# Patient Record
Sex: Female | Born: 1965 | Race: White | Hispanic: No | Marital: Married | Smoking: Current some day smoker
Health system: Southern US, Community
[De-identification: ages and names within clinical notes are randomized; demographics above are authoritative.]

## PROBLEM LIST (undated history)

## (undated) DIAGNOSIS — I10 Essential (primary) hypertension: Secondary | ICD-10-CM

## (undated) DIAGNOSIS — I219 Acute myocardial infarction, unspecified: Secondary | ICD-10-CM

## (undated) DIAGNOSIS — IMO0001 Reserved for inherently not codable concepts without codable children: Secondary | ICD-10-CM

## (undated) DIAGNOSIS — R918 Other nonspecific abnormal finding of lung field: Secondary | ICD-10-CM

## (undated) DIAGNOSIS — J45909 Unspecified asthma, uncomplicated: Secondary | ICD-10-CM

## (undated) DIAGNOSIS — G43909 Migraine, unspecified, not intractable, without status migrainosus: Secondary | ICD-10-CM

## (undated) DIAGNOSIS — R1314 Dysphagia, pharyngoesophageal phase: Secondary | ICD-10-CM

## (undated) DIAGNOSIS — J439 Emphysema, unspecified: Secondary | ICD-10-CM

## (undated) DIAGNOSIS — E7800 Pure hypercholesterolemia, unspecified: Secondary | ICD-10-CM

## (undated) DIAGNOSIS — I509 Heart failure, unspecified: Secondary | ICD-10-CM

## (undated) DIAGNOSIS — J9692 Respiratory failure, unspecified with hypercapnia: Secondary | ICD-10-CM

## (undated) DIAGNOSIS — K219 Gastro-esophageal reflux disease without esophagitis: Secondary | ICD-10-CM

## (undated) DIAGNOSIS — J8489 Other specified interstitial pulmonary diseases: Secondary | ICD-10-CM

## (undated) DIAGNOSIS — F419 Anxiety disorder, unspecified: Secondary | ICD-10-CM

## (undated) DIAGNOSIS — T753XXA Motion sickness, initial encounter: Secondary | ICD-10-CM

## (undated) DIAGNOSIS — S2220XA Unspecified fracture of sternum, initial encounter for closed fracture: Secondary | ICD-10-CM

## (undated) DIAGNOSIS — J302 Other seasonal allergic rhinitis: Secondary | ICD-10-CM

## (undated) HISTORY — PX: LUNG SURGERY: SHX703

## (undated) HISTORY — DX: Emphysema, unspecified: J43.9

## (undated) HISTORY — DX: Pure hypercholesterolemia, unspecified: E78.00

## (undated) HISTORY — PX: VAGINAL HYSTERECTOMY: SUR661

## (undated) HISTORY — DX: Dysphagia, pharyngoesophageal phase: R13.14

## (undated) HISTORY — DX: Anxiety disorder, unspecified: F41.9

## (undated) HISTORY — DX: Other seasonal allergic rhinitis: J30.2

## (undated) HISTORY — PX: OOPHORECTOMY: SHX86

## (undated) HISTORY — DX: Other nonspecific abnormal finding of lung field: R91.8

## (undated) HISTORY — DX: Migraine, unspecified, not intractable, without status migrainosus: G43.909

## (undated) HISTORY — DX: Acute myocardial infarction, unspecified: I21.9

---

## 2007-04-06 ENCOUNTER — Ambulatory Visit: Payer: Self-pay | Admitting: Family Medicine

## 2007-06-04 ENCOUNTER — Emergency Department: Payer: Self-pay | Admitting: Internal Medicine

## 2007-06-04 ENCOUNTER — Other Ambulatory Visit: Payer: Self-pay

## 2007-09-14 ENCOUNTER — Emergency Department: Payer: Self-pay | Admitting: Emergency Medicine

## 2007-09-14 ENCOUNTER — Other Ambulatory Visit: Payer: Self-pay

## 2007-10-06 ENCOUNTER — Ambulatory Visit: Payer: Self-pay | Admitting: Family Medicine

## 2007-10-14 ENCOUNTER — Ambulatory Visit: Payer: Self-pay | Admitting: Internal Medicine

## 2009-06-03 ENCOUNTER — Emergency Department: Payer: Self-pay | Admitting: Unknown Physician Specialty

## 2009-06-23 ENCOUNTER — Ambulatory Visit: Payer: Self-pay | Admitting: Internal Medicine

## 2009-07-21 ENCOUNTER — Inpatient Hospital Stay: Payer: Self-pay | Admitting: Internal Medicine

## 2009-07-31 ENCOUNTER — Ambulatory Visit: Payer: Self-pay | Admitting: Internal Medicine

## 2009-08-03 ENCOUNTER — Inpatient Hospital Stay: Payer: Self-pay | Admitting: Student

## 2009-08-17 ENCOUNTER — Emergency Department: Payer: Self-pay | Admitting: Internal Medicine

## 2010-02-12 ENCOUNTER — Emergency Department: Payer: Self-pay | Admitting: Emergency Medicine

## 2010-07-19 ENCOUNTER — Ambulatory Visit: Payer: Self-pay

## 2010-08-25 ENCOUNTER — Inpatient Hospital Stay: Payer: Self-pay | Admitting: *Deleted

## 2010-09-16 DIAGNOSIS — I219 Acute myocardial infarction, unspecified: Secondary | ICD-10-CM

## 2010-09-16 HISTORY — DX: Acute myocardial infarction, unspecified: I21.9

## 2010-11-05 ENCOUNTER — Ambulatory Visit: Payer: Self-pay

## 2010-11-21 ENCOUNTER — Ambulatory Visit: Payer: Self-pay | Admitting: Internal Medicine

## 2010-11-21 ENCOUNTER — Emergency Department: Payer: Self-pay | Admitting: Emergency Medicine

## 2010-11-30 ENCOUNTER — Ambulatory Visit: Payer: Self-pay | Admitting: Internal Medicine

## 2010-12-07 ENCOUNTER — Ambulatory Visit: Payer: Self-pay | Admitting: Internal Medicine

## 2011-02-19 ENCOUNTER — Encounter: Payer: Self-pay | Admitting: Thoracic Surgery (Cardiothoracic Vascular Surgery)

## 2011-03-28 ENCOUNTER — Ambulatory Visit: Payer: Self-pay

## 2011-06-07 ENCOUNTER — Inpatient Hospital Stay: Payer: Self-pay | Admitting: Specialist

## 2011-06-19 ENCOUNTER — Encounter: Payer: Self-pay | Admitting: Internal Medicine

## 2011-06-19 ENCOUNTER — Emergency Department: Payer: Self-pay | Admitting: Emergency Medicine

## 2011-06-25 ENCOUNTER — Inpatient Hospital Stay: Payer: Self-pay | Admitting: Internal Medicine

## 2011-06-30 ENCOUNTER — Emergency Department: Payer: Self-pay | Admitting: *Deleted

## 2011-07-10 ENCOUNTER — Emergency Department: Payer: Self-pay | Admitting: Unknown Physician Specialty

## 2011-07-18 ENCOUNTER — Encounter: Payer: Self-pay | Admitting: Internal Medicine

## 2011-07-20 ENCOUNTER — Inpatient Hospital Stay: Payer: Self-pay | Admitting: Internal Medicine

## 2011-08-17 ENCOUNTER — Encounter: Payer: Self-pay | Admitting: Internal Medicine

## 2011-09-17 ENCOUNTER — Inpatient Hospital Stay: Payer: Self-pay | Admitting: Internal Medicine

## 2011-09-17 LAB — CBC
HCT: 31.6 % — ABNORMAL LOW (ref 35.0–47.0)
HGB: 10.1 g/dL — ABNORMAL LOW (ref 12.0–16.0)
MCH: 25 pg — ABNORMAL LOW (ref 26.0–34.0)
MCHC: 31.8 g/dL — ABNORMAL LOW (ref 32.0–36.0)
RDW: 19.4 % — ABNORMAL HIGH (ref 11.5–14.5)
WBC: 12.6 10*3/uL — ABNORMAL HIGH (ref 3.6–11.0)

## 2011-09-17 LAB — BASIC METABOLIC PANEL
Anion Gap: 9 (ref 7–16)
BUN: 9 mg/dL (ref 7–18)
Calcium, Total: 8.4 mg/dL — ABNORMAL LOW (ref 8.5–10.1)
Chloride: 107 mmol/L (ref 98–107)
Co2: 26 mmol/L (ref 21–32)
Creatinine: 0.72 mg/dL (ref 0.60–1.30)
Osmolality: 283 (ref 275–301)
Potassium: 3.4 mmol/L — ABNORMAL LOW (ref 3.5–5.1)
Sodium: 142 mmol/L (ref 136–145)

## 2011-09-17 LAB — PRO B NATRIURETIC PEPTIDE: B-Type Natriuretic Peptide: 74 pg/mL (ref 0–125)

## 2011-09-18 LAB — CBC WITH DIFFERENTIAL/PLATELET
Basophil %: 0 %
Eosinophil #: 0 10*3/uL (ref 0.0–0.7)
HCT: 29.3 % — ABNORMAL LOW (ref 35.0–47.0)
HGB: 9.2 g/dL — ABNORMAL LOW (ref 12.0–16.0)
Lymphocyte #: 0.5 10*3/uL — ABNORMAL LOW (ref 1.0–3.6)
MCH: 24.8 pg — ABNORMAL LOW (ref 26.0–34.0)
MCHC: 31.5 g/dL — ABNORMAL LOW (ref 32.0–36.0)
Monocyte #: 0.2 10*3/uL (ref 0.0–0.7)
Monocyte %: 1.4 %
Neutrophil #: 15.3 10*3/uL — ABNORMAL HIGH (ref 1.4–6.5)
Neutrophil %: 95.8 %
WBC: 16 10*3/uL — ABNORMAL HIGH (ref 3.6–11.0)

## 2011-09-18 LAB — BASIC METABOLIC PANEL
BUN: 12 mg/dL (ref 7–18)
EGFR (African American): >60
EGFR (Non-African Amer.): >60
Glucose: 178 mg/dL — ABNORMAL HIGH (ref 65–99)
Potassium: 3.3 mmol/L — ABNORMAL LOW (ref 3.5–5.1)
Sodium: 140 mmol/L (ref 136–145)

## 2011-09-23 LAB — CBC WITH DIFFERENTIAL/PLATELET
Basophil #: 0 10*3/uL (ref 0.0–0.1)
Basophil %: 0 %
Eosinophil #: 0 10*3/uL (ref 0.0–0.7)
Eosinophil %: 0 %
HCT: 29.8 % — ABNORMAL LOW (ref 35.0–47.0)
HGB: 9.3 g/dL — ABNORMAL LOW (ref 12.0–16.0)
Lymphocyte #: 0.6 10*3/uL — ABNORMAL LOW (ref 1.0–3.6)
Lymphocyte %: 4 %
MCH: 24.7 pg — ABNORMAL LOW (ref 26.0–34.0)
MCHC: 31.2 g/dL — ABNORMAL LOW (ref 32.0–36.0)
MCV: 79 fL — ABNORMAL LOW (ref 80–100)
Monocyte #: 1 10*3/uL — ABNORMAL HIGH (ref 0.0–0.7)
Monocyte %: 6.4 %
Neutrophil #: 13.3 10*3/uL — ABNORMAL HIGH (ref 1.4–6.5)
Neutrophil %: 89.6 %
Platelet: 420 10*3/uL (ref 150–440)
RBC: 3.75 10*6/uL — ABNORMAL LOW (ref 3.80–5.20)
RDW: 18.5 % — ABNORMAL HIGH (ref 11.5–14.5)
WBC: 14.9 10*3/uL — ABNORMAL HIGH (ref 3.6–11.0)

## 2011-09-23 LAB — BASIC METABOLIC PANEL
Anion Gap: 9 (ref 7–16)
BUN: 17 mg/dL (ref 7–18)
Calcium, Total: 8.3 mg/dL — ABNORMAL LOW (ref 8.5–10.1)
Chloride: 101 mmol/L (ref 98–107)
Co2: 33 mmol/L — ABNORMAL HIGH (ref 21–32)
Creatinine: 0.66 mg/dL (ref 0.60–1.30)
EGFR (African American): >60
EGFR (Non-African Amer.): >60
Glucose: 159 mg/dL — ABNORMAL HIGH (ref 65–99)
Osmolality: 290 (ref 275–301)
Potassium: 4.3 mmol/L (ref 3.5–5.1)
Sodium: 143 mmol/L (ref 136–145)

## 2011-09-23 LAB — CULTURE, BLOOD (SINGLE)

## 2011-09-26 ENCOUNTER — Encounter: Payer: Self-pay | Admitting: Internal Medicine

## 2011-09-29 ENCOUNTER — Inpatient Hospital Stay: Payer: Self-pay | Admitting: Internal Medicine

## 2011-09-29 LAB — COMPREHENSIVE METABOLIC PANEL
Anion Gap: 8 (ref 7–16)
Bilirubin,Total: 0.4 mg/dL (ref 0.2–1.0)
Chloride: 102 mmol/L (ref 98–107)
Co2: 30 mmol/L (ref 21–32)
Creatinine: 0.72 mg/dL (ref 0.60–1.30)
EGFR (African American): >60
EGFR (Non-African Amer.): >60
Potassium: 3.9 mmol/L (ref 3.5–5.1)
SGOT(AST): 47 U/L — ABNORMAL HIGH (ref 15–37)
SGPT (ALT): 88 U/L — ABNORMAL HIGH
Total Protein: 6.1 g/dL — ABNORMAL LOW (ref 6.4–8.2)

## 2011-09-29 LAB — CBC
HGB: 9.2 g/dL — ABNORMAL LOW (ref 12.0–16.0)
MCH: 24.7 pg — ABNORMAL LOW (ref 26.0–34.0)
MCHC: 31.3 g/dL — ABNORMAL LOW (ref 32.0–36.0)
MCV: 79 fL — ABNORMAL LOW (ref 80–100)
Platelet: 285 10*3/uL (ref 150–440)
RBC: 3.71 10*6/uL — ABNORMAL LOW (ref 3.80–5.20)
RDW: 19.7 % — ABNORMAL HIGH (ref 11.5–14.5)
WBC: 34.5 10*3/uL — ABNORMAL HIGH (ref 3.6–11.0)

## 2011-09-30 LAB — CBC WITH DIFFERENTIAL/PLATELET
Basophil #: 0 10*3/uL (ref 0.0–0.1)
Eosinophil #: 0 10*3/uL (ref 0.0–0.7)
HCT: 28.3 % — ABNORMAL LOW (ref 35.0–47.0)
HGB: 8.6 g/dL — ABNORMAL LOW (ref 12.0–16.0)
Lymphocyte %: 1.8 %
MCHC: 30.5 g/dL — ABNORMAL LOW (ref 32.0–36.0)
MCV: 79 fL — ABNORMAL LOW (ref 80–100)
Monocyte #: 0 10*3/uL (ref 0.0–0.7)
Neutrophil %: 96.5 %
Platelet: 255 10*3/uL (ref 150–440)
RBC: 3.57 10*6/uL — ABNORMAL LOW (ref 3.80–5.20)
RDW: 19.7 % — ABNORMAL HIGH (ref 11.5–14.5)
WBC: 24.6 10*3/uL — ABNORMAL HIGH (ref 3.6–11.0)

## 2011-09-30 LAB — BASIC METABOLIC PANEL
Anion Gap: 10 (ref 7–16)
Calcium, Total: 8.5 mg/dL (ref 8.5–10.1)
Chloride: 104 mmol/L (ref 98–107)
Creatinine: 0.67 mg/dL (ref 0.60–1.30)
EGFR (African American): >60
EGFR (Non-African Amer.): >60
Glucose: 187 mg/dL — ABNORMAL HIGH (ref 65–99)
Osmolality: 289 (ref 275–301)

## 2011-10-02 LAB — PHOSPHORUS: Phosphorus: 2.1 mg/dL — ABNORMAL LOW (ref 2.5–4.9)

## 2011-10-03 LAB — CBC WITH DIFFERENTIAL/PLATELET
Basophil #: 0 10*3/uL (ref 0.0–0.1)
Basophil %: 0.1 %
Eosinophil #: 0 10*3/uL (ref 0.0–0.7)
Eosinophil %: 0 %
HCT: 29.1 % — ABNORMAL LOW (ref 35.0–47.0)
HGB: 9.1 g/dL — ABNORMAL LOW (ref 12.0–16.0)
Lymphocyte #: 0.3 10*3/uL — ABNORMAL LOW (ref 1.0–3.6)
Lymphocyte %: 2.1 %
MCH: 24.5 pg — ABNORMAL LOW (ref 26.0–34.0)
MCHC: 31.4 g/dL — ABNORMAL LOW (ref 32.0–36.0)
MCV: 78 fL — ABNORMAL LOW (ref 80–100)
Monocyte #: 0.6 10*3/uL (ref 0.0–0.7)
Monocyte %: 3.5 %
Neutrophil #: 15.5 10*3/uL — ABNORMAL HIGH (ref 1.4–6.5)
Neutrophil %: 94.3 %
Platelet: 265 10*3/uL (ref 150–440)
RBC: 3.73 10*6/uL — ABNORMAL LOW (ref 3.80–5.20)
RDW: 20 % — ABNORMAL HIGH (ref 11.5–14.5)
WBC: 16.6 10*3/uL — ABNORMAL HIGH (ref 3.6–11.0)

## 2011-10-03 LAB — BASIC METABOLIC PANEL
Anion Gap: 11 (ref 7–16)
BUN: 14 mg/dL (ref 7–18)
Calcium, Total: 8.2 mg/dL — ABNORMAL LOW (ref 8.5–10.1)
Chloride: 102 mmol/L (ref 98–107)
Co2: 32 mmol/L (ref 21–32)
Creatinine: 0.5 mg/dL — ABNORMAL LOW (ref 0.60–1.30)
EGFR (African American): >60
EGFR (Non-African Amer.): >60
Glucose: 167 mg/dL — ABNORMAL HIGH (ref 65–99)
Osmolality: 293 (ref 275–301)
Potassium: 3.6 mmol/L (ref 3.5–5.1)
Sodium: 145 mmol/L (ref 136–145)

## 2011-10-04 LAB — PHOSPHORUS: Phosphorus: 3.2 mg/dL (ref 2.5–4.9)

## 2011-10-04 LAB — BASIC METABOLIC PANEL
Anion Gap: 12 (ref 7–16)
BUN: 16 mg/dL (ref 7–18)
Calcium, Total: 8 mg/dL — ABNORMAL LOW (ref 8.5–10.1)
Chloride: 104 mmol/L (ref 98–107)
Co2: 30 mmol/L (ref 21–32)
Creatinine: 0.42 mg/dL — ABNORMAL LOW (ref 0.60–1.30)
EGFR (African American): >60
EGFR (Non-African Amer.): >60
Glucose: 169 mg/dL — ABNORMAL HIGH (ref 65–99)
Osmolality: 296 (ref 275–301)
Potassium: 3.6 mmol/L (ref 3.5–5.1)
Sodium: 146 mmol/L — ABNORMAL HIGH (ref 136–145)

## 2011-10-04 LAB — CBC WITH DIFFERENTIAL/PLATELET
Basophil #: 0 10*3/uL (ref 0.0–0.1)
Basophil %: 0.1 %
Eosinophil #: 0 10*3/uL (ref 0.0–0.7)
Eosinophil %: 0 %
HCT: 29.1 % — ABNORMAL LOW (ref 35.0–47.0)
HGB: 9.1 g/dL — ABNORMAL LOW (ref 12.0–16.0)
Lymphocyte #: 0.3 10*3/uL — ABNORMAL LOW (ref 1.0–3.6)
Lymphocyte %: 2.4 %
MCH: 24.3 pg — ABNORMAL LOW (ref 26.0–34.0)
MCHC: 31.3 g/dL — ABNORMAL LOW (ref 32.0–36.0)
MCV: 78 fL — ABNORMAL LOW (ref 80–100)
Monocyte #: 0.5 10*3/uL (ref 0.0–0.7)
Monocyte %: 4 %
Neutrophil #: 11 10*3/uL — ABNORMAL HIGH (ref 1.4–6.5)
Neutrophil %: 93.5 %
Platelet: 238 10*3/uL (ref 150–440)
RBC: 3.74 10*6/uL — ABNORMAL LOW (ref 3.80–5.20)
RDW: 19.8 % — ABNORMAL HIGH (ref 11.5–14.5)
WBC: 11.9 10*3/uL — ABNORMAL HIGH (ref 3.6–11.0)

## 2011-10-05 LAB — BASIC METABOLIC PANEL
Anion Gap: 10 (ref 7–16)
BUN: 15 mg/dL (ref 7–18)
Calcium, Total: 7.7 mg/dL — ABNORMAL LOW (ref 8.5–10.1)
Chloride: 105 mmol/L (ref 98–107)
Co2: 31 mmol/L (ref 21–32)
Creatinine: 0.38 mg/dL — ABNORMAL LOW (ref 0.60–1.30)
EGFR (African American): >60
EGFR (Non-African Amer.): >60
Glucose: 152 mg/dL — ABNORMAL HIGH (ref 65–99)
Osmolality: 294 (ref 275–301)
Potassium: 3.4 mmol/L — ABNORMAL LOW (ref 3.5–5.1)
Sodium: 146 mmol/L — ABNORMAL HIGH (ref 136–145)

## 2011-10-05 LAB — CBC WITH DIFFERENTIAL/PLATELET
Comment - H1-Com2: NORMAL
HCT: 28 % — ABNORMAL LOW (ref 35.0–47.0)
HGB: 8.8 g/dL — ABNORMAL LOW (ref 12.0–16.0)
Lymphocytes: 1 %
MCH: 24.4 pg — ABNORMAL LOW (ref 26.0–34.0)
MCHC: 31.4 g/dL — ABNORMAL LOW (ref 32.0–36.0)
MCV: 78 fL — ABNORMAL LOW (ref 80–100)
Monocytes: 1 %
Platelet: 249 10*3/uL (ref 150–440)
RBC: 3.61 10*6/uL — ABNORMAL LOW (ref 3.80–5.20)
RDW: 20.6 % — ABNORMAL HIGH (ref 11.5–14.5)
Segmented Neutrophils: 98 %
WBC: 19.6 10*3/uL — ABNORMAL HIGH (ref 3.6–11.0)

## 2011-10-08 LAB — BASIC METABOLIC PANEL
Anion Gap: 10 (ref 7–16)
BUN: 14 mg/dL (ref 7–18)
Calcium, Total: 8.2 mg/dL — ABNORMAL LOW (ref 8.5–10.1)
Chloride: 100 mmol/L (ref 98–107)
Co2: 31 mmol/L (ref 21–32)
Creatinine: 0.67 mg/dL (ref 0.60–1.30)
EGFR (African American): >60
EGFR (Non-African Amer.): >60
Glucose: 114 mg/dL — ABNORMAL HIGH (ref 65–99)
Osmolality: 283 (ref 275–301)
Potassium: 3.9 mmol/L (ref 3.5–5.1)
Sodium: 141 mmol/L (ref 136–145)

## 2011-10-08 LAB — CBC WITH DIFFERENTIAL/PLATELET
Basophil #: 0 10*3/uL (ref 0.0–0.1)
Basophil %: 0 %
Eosinophil #: 0 10*3/uL (ref 0.0–0.7)
Eosinophil %: 0 %
HCT: 28.7 % — ABNORMAL LOW (ref 35.0–47.0)
HGB: 9 g/dL — ABNORMAL LOW (ref 12.0–16.0)
Lymphocyte #: 0.3 10*3/uL — ABNORMAL LOW (ref 1.0–3.6)
Lymphocyte %: 1.4 %
MCH: 24.3 pg — ABNORMAL LOW (ref 26.0–34.0)
MCHC: 31.4 g/dL — ABNORMAL LOW (ref 32.0–36.0)
MCV: 77 fL — ABNORMAL LOW (ref 80–100)
Monocyte #: 0.5 10*3/uL (ref 0.0–0.7)
Monocyte %: 2.5 %
Neutrophil #: 18.9 10*3/uL — ABNORMAL HIGH (ref 1.4–6.5)
Neutrophil %: 96.1 %
Platelet: 325 10*3/uL (ref 150–440)
RBC: 3.71 10*6/uL — ABNORMAL LOW (ref 3.80–5.20)
RDW: 19.9 % — ABNORMAL HIGH (ref 11.5–14.5)
WBC: 19.7 10*3/uL — ABNORMAL HIGH (ref 3.6–11.0)

## 2011-10-13 ENCOUNTER — Inpatient Hospital Stay: Payer: Self-pay | Admitting: Internal Medicine

## 2011-10-13 LAB — LIPASE, BLOOD: Lipase: 91 U/L (ref 73–393)

## 2011-10-13 LAB — COMPREHENSIVE METABOLIC PANEL
Albumin: 4.4 g/dL (ref 3.4–5.0)
Alkaline Phosphatase: 105 U/L (ref 50–136)
BUN: 28 mg/dL — ABNORMAL HIGH (ref 7–18)
Co2: 34 mmol/L — ABNORMAL HIGH (ref 21–32)
Creatinine: 0.82 mg/dL (ref 0.60–1.30)
Osmolality: 277 (ref 275–301)
SGOT(AST): 17 U/L (ref 15–37)
SGPT (ALT): 40 U/L
Total Protein: 8.3 g/dL — ABNORMAL HIGH (ref 6.4–8.2)

## 2011-10-13 LAB — CBC
HGB: 13.5 g/dL (ref 12.0–16.0)
MCHC: 30.8 g/dL — ABNORMAL LOW (ref 32.0–36.0)
MCV: 78 fL — ABNORMAL LOW (ref 80–100)
Platelet: 622 10*3/uL — ABNORMAL HIGH (ref 150–440)
RBC: 5.63 10*6/uL — ABNORMAL HIGH (ref 3.80–5.20)
RDW: 19 % — ABNORMAL HIGH (ref 11.5–14.5)
WBC: 21.8 10*3/uL — ABNORMAL HIGH (ref 3.6–11.0)

## 2011-10-13 LAB — URINALYSIS, COMPLETE
Blood: NEGATIVE
Glucose,UR: NEGATIVE mg/dL (ref 0–75)
Hyaline Cast: 33
Ketone: NEGATIVE
Leukocyte Esterase: NEGATIVE
Nitrite: NEGATIVE
Ph: 7 (ref 4.5–8.0)
Protein: 30
RBC,UR: <1 /HPF (ref 0–5)
Specific Gravity: 1.01 (ref 1.003–1.030)

## 2011-10-13 LAB — CK TOTAL AND CKMB (NOT AT ARMC)
CK, Total: 29 U/L (ref 21–215)
CK-MB: 1.1 ng/mL (ref 0.5–3.6)

## 2011-10-13 LAB — TROPONIN I: Troponin-I: 0.06 ng/mL — ABNORMAL HIGH

## 2011-10-13 LAB — PRO B NATRIURETIC PEPTIDE: B-Type Natriuretic Peptide: 726 pg/mL — ABNORMAL HIGH (ref 0–125)

## 2011-10-14 LAB — CBC WITH DIFFERENTIAL/PLATELET
Basophil %: 0.1 %
Eosinophil #: 0.2 10*3/uL (ref 0.0–0.7)
Eosinophil %: 1.6 %
HCT: 39.4 % (ref 35.0–47.0)
HGB: 12.2 g/dL (ref 12.0–16.0)
Lymphocyte #: 1.6 10*3/uL (ref 1.0–3.6)
Lymphocyte %: 11.4 %
Monocyte %: 10.3 %
Neutrophil #: 11 10*3/uL — ABNORMAL HIGH (ref 1.4–6.5)
Neutrophil %: 76.6 %
Platelet: 483 10*3/uL — ABNORMAL HIGH (ref 150–440)
RBC: 5.14 10*6/uL (ref 3.80–5.20)

## 2011-10-14 LAB — BASIC METABOLIC PANEL
BUN: 28 mg/dL — ABNORMAL HIGH (ref 7–18)
Calcium, Total: 8.4 mg/dL — ABNORMAL LOW (ref 8.5–10.1)
Chloride: 94 mmol/L — ABNORMAL LOW (ref 98–107)
Creatinine: 0.42 mg/dL — ABNORMAL LOW (ref 0.60–1.30)
EGFR (Non-African Amer.): >60
Glucose: 77 mg/dL (ref 65–99)
Potassium: 3.2 mmol/L — ABNORMAL LOW (ref 3.5–5.1)
Sodium: 135 mmol/L — ABNORMAL LOW (ref 136–145)

## 2011-10-18 ENCOUNTER — Encounter: Payer: Self-pay | Admitting: Internal Medicine

## 2011-10-19 LAB — CULTURE, BLOOD (SINGLE)

## 2012-07-21 ENCOUNTER — Inpatient Hospital Stay: Payer: Self-pay | Admitting: Internal Medicine

## 2012-07-21 LAB — CBC
HCT: 38.3 % (ref 35.0–47.0)
HGB: 12.5 g/dL (ref 12.0–16.0)
MCHC: 32.7 g/dL (ref 32.0–36.0)
MCV: 84 fL (ref 80–100)
RBC: 4.58 10*6/uL (ref 3.80–5.20)
RDW: 19.2 % — ABNORMAL HIGH (ref 11.5–14.5)
WBC: 4.5 10*3/uL (ref 3.6–11.0)

## 2012-07-21 LAB — COMPREHENSIVE METABOLIC PANEL
Albumin: 3.5 g/dL (ref 3.4–5.0)
Alkaline Phosphatase: 138 U/L — ABNORMAL HIGH (ref 50–136)
BUN: 14 mg/dL (ref 7–18)
Bilirubin,Total: 0.1 mg/dL — ABNORMAL LOW (ref 0.2–1.0)
Co2: 26 mmol/L (ref 21–32)
Creatinine: 0.62 mg/dL (ref 0.60–1.30)
EGFR (African American): >60
EGFR (Non-African Amer.): >60
Glucose: 78 mg/dL (ref 65–99)
Osmolality: 290 (ref 275–301)
Potassium: 3.3 mmol/L — ABNORMAL LOW (ref 3.5–5.1)
SGPT (ALT): 35 U/L (ref 12–78)
Sodium: 146 mmol/L — ABNORMAL HIGH (ref 136–145)
Total Protein: 6.7 g/dL (ref 6.4–8.2)

## 2012-07-21 LAB — TROPONIN I: Troponin-I: <0.02 ng/mL

## 2012-07-22 LAB — BASIC METABOLIC PANEL
BUN: 13 mg/dL (ref 7–18)
Calcium, Total: 8.7 mg/dL (ref 8.5–10.1)
Chloride: 111 mmol/L — ABNORMAL HIGH (ref 98–107)
Creatinine: 0.59 mg/dL — ABNORMAL LOW (ref 0.60–1.30)
EGFR (African American): >60
EGFR (Non-African Amer.): >60
Osmolality: 291 (ref 275–301)
Potassium: 3.8 mmol/L (ref 3.5–5.1)

## 2012-07-23 LAB — CBC WITH DIFFERENTIAL/PLATELET
Basophil #: 0 10*3/uL (ref 0.0–0.1)
Basophil %: 0.1 %
Eosinophil %: 0 %
HCT: 36.6 % (ref 35.0–47.0)
HGB: 11.8 g/dL — ABNORMAL LOW (ref 12.0–16.0)
Lymphocyte %: 6.8 %
MCHC: 32.2 g/dL (ref 32.0–36.0)
Monocyte %: 5.7 %
Neutrophil #: 11.1 10*3/uL — ABNORMAL HIGH (ref 1.4–6.5)
RBC: 4.35 10*6/uL (ref 3.80–5.20)
RDW: 19.3 % — ABNORMAL HIGH (ref 11.5–14.5)
WBC: 12.7 10*3/uL — ABNORMAL HIGH (ref 3.6–11.0)

## 2012-11-27 ENCOUNTER — Ambulatory Visit: Payer: Self-pay | Admitting: Family

## 2013-03-19 ENCOUNTER — Emergency Department: Payer: Self-pay | Admitting: Emergency Medicine

## 2013-04-01 ENCOUNTER — Emergency Department: Payer: Self-pay | Admitting: Emergency Medicine

## 2013-04-06 ENCOUNTER — Emergency Department: Payer: Self-pay | Admitting: Emergency Medicine

## 2013-06-22 ENCOUNTER — Emergency Department: Payer: Self-pay | Admitting: Emergency Medicine

## 2013-06-22 LAB — COMPREHENSIVE METABOLIC PANEL
Alkaline Phosphatase: 107 U/L (ref 50–136)
Anion Gap: 6 — ABNORMAL LOW (ref 7–16)
BUN: 16 mg/dL (ref 7–18)
Bilirubin,Total: 0.2 mg/dL (ref 0.2–1.0)
Calcium, Total: 8.8 mg/dL (ref 8.5–10.1)
Chloride: 110 mmol/L — ABNORMAL HIGH (ref 98–107)
Co2: 24 mmol/L (ref 21–32)
EGFR (African American): >60
Glucose: 149 mg/dL — ABNORMAL HIGH (ref 65–99)
Potassium: 3.5 mmol/L (ref 3.5–5.1)
SGOT(AST): 33 U/L (ref 15–37)
Sodium: 140 mmol/L (ref 136–145)
Total Protein: 7.5 g/dL (ref 6.4–8.2)

## 2013-06-22 LAB — TROPONIN I: Troponin-I: <0.02 ng/mL

## 2013-06-22 LAB — CBC
HGB: 14.2 g/dL (ref 12.0–16.0)
MCH: 29.4 pg (ref 26.0–34.0)
MCHC: 33.2 g/dL (ref 32.0–36.0)
MCV: 89 fL (ref 80–100)
Platelet: 291 10*3/uL (ref 150–440)
RDW: 15.1 % — ABNORMAL HIGH (ref 11.5–14.5)

## 2013-06-22 LAB — PRO B NATRIURETIC PEPTIDE: B-Type Natriuretic Peptide: 243 pg/mL — ABNORMAL HIGH (ref 0–125)

## 2013-07-23 ENCOUNTER — Ambulatory Visit: Payer: Self-pay | Admitting: Family

## 2013-08-10 ENCOUNTER — Ambulatory Visit: Payer: Self-pay | Admitting: Internal Medicine

## 2014-12-26 ENCOUNTER — Ambulatory Visit
Admit: 2014-12-26 | Disposition: A | Discharge status: Home / Self Care | Payer: Self-pay | Attending: Internal Medicine | Admitting: Internal Medicine

## 2014-12-26 DIAGNOSIS — Z9889 Other specified postprocedural states: Secondary | ICD-10-CM | POA: Diagnosis not present

## 2014-12-26 DIAGNOSIS — I10 Essential (primary) hypertension: Secondary | ICD-10-CM | POA: Diagnosis not present

## 2014-12-26 DIAGNOSIS — J984 Other disorders of lung: Secondary | ICD-10-CM | POA: Diagnosis not present

## 2014-12-26 DIAGNOSIS — R5381 Other malaise: Secondary | ICD-10-CM | POA: Diagnosis not present

## 2014-12-26 DIAGNOSIS — R079 Chest pain, unspecified: Secondary | ICD-10-CM | POA: Diagnosis not present

## 2014-12-27 DIAGNOSIS — J90 Pleural effusion, not elsewhere classified: Secondary | ICD-10-CM | POA: Diagnosis not present

## 2014-12-27 DIAGNOSIS — J9610 Chronic respiratory failure, unspecified whether with hypoxia or hypercapnia: Secondary | ICD-10-CM | POA: Diagnosis not present

## 2014-12-27 DIAGNOSIS — Z720 Tobacco use: Secondary | ICD-10-CM | POA: Diagnosis not present

## 2014-12-27 DIAGNOSIS — R079 Chest pain, unspecified: Secondary | ICD-10-CM | POA: Diagnosis not present

## 2014-12-28 DIAGNOSIS — R091 Pleurisy: Secondary | ICD-10-CM | POA: Diagnosis not present

## 2014-12-28 DIAGNOSIS — R079 Chest pain, unspecified: Secondary | ICD-10-CM | POA: Diagnosis not present

## 2015-01-03 NOTE — Discharge Summary (Signed)
PATIENT NAME:  Caitlyn Evans, Caitlyn Evans MR#:  [id number] DATE OF BIRTH:  1965/10/21  DATE OF ADMISSION:  07/21/2012 DATE OF DISCHARGE:  07/23/2012  PRESENTING COMPLAINT: Shortness of breath and cough.   DISCHARGE DIAGNOSES:  1. Acute chronic obstructive pulmonary disease exacerbation.  2. Mild acute bronchitis.   CONDITION ON DISCHARGE: Fair. Saturations 94% on room air.   CODE STATUS: FULL CODE.   MEDICATIONS:  1. Symbicort 160/4.5 mcg inhalation 2 puffs b.i.d.  2. Budesonide formoterol.  3. Amitriptyline 25 mg 1 at bedtime.  4. Aspirin 81 mg daily.  5. Alprazolam 0.25 mg on tablet every 8 hours as needed.  6. Dextromethorphan/guaifenesin 10 mL oral liquid every 6 hours as needed for cough over-the-counter.  7. Levaquin 500 mg p.o. daily for 5 more days.  8. Prednisone taper.  9. DuoNeb.   CONDITION ON DISCHARGE: Fair.   FOLLOWUP: Follow with [facility] on your appointment in November 2013.   LABORATORY DATA: White count is 12.7, hemoglobin is 11.8 and hematocrit 36.6. Basic metabolic panel within normal limits. Magnesium 2.2.   Chest x-ray was consistent with fibrosis of the right lung apex. Postoperative changes in the left upper lobe.   BRIEF SUMMARY OF HOSPITAL COURSE: Caitlyn Evans is a 49 year old Caucasian female who was admitted with:  1. Acute respiratory failure requiring oxygen, improved. The patient was on nasal cannula, weaned off, at room air was 94%.  2. Chronic obstructive pulmonary disease exacerbation. IV steroids. The patient received IV steroids, Symbicort, and nebulizer treatments along with Spiriva were continued. The patient will be given these inhalers to go home with. She was covered with empiric Levaquin for possible mild acute bronchitis.  3. Tachycardia in the setting of chronic obstructive pulmonary disease exacerbation, improved.  4. Elevated AST and alkaline phosphatase. The patient has known history of gallstones and a normal HIDA scan. The patient did not  have any symptoms. She remained stable on that standpoint.  5. History of chronic pain syndrome.  6. Anxiety and depression. P.r.n. Xanax was given.  7. Overall is hospital stay remained stable. Patient remained a FULL CODE.    She will follow up at [facility] on her set appointments.   TIME SPENT: 35 minutes.      ____________________________ Wylie HailSona A. Allena KatzPatel, MD sap:vtd D: 07/24/2012 06:55:13 ET T: 07/25/2012 09:56:04 ET JOB#: [id number]  cc: Areal Cochrane A. Allena KatzPatel, MD, <Dictator> [facility] Willow OraSONA A Alaira Level MD ELECTRONICALLY SIGNED 07/28/2012 1:24

## 2015-01-03 NOTE — H&P (Signed)
PATIENT NAME:  Caitlyn Evans, Caitlyn Evans MR#:  [id number] DATE OF BIRTH:  March 30, 1966  DATE OF ADMISSION:  07/21/2012  REFERRING PHYSICIAN: ER physician, Dr. Cyril Loosen    PRIMARY CARE PHYSICIAN: The patient has no primary care physician at present.  CHIEF COMPLAINT: Cough, shortness of breath.   HISTORY OF PRESENT ILLNESS: The patient is a 49 year old female with past medical history of COPD, hyperlipidemia, BOOP, chronic pain syndrome, anxiety, and depression who was last admitted to [facility] in January of 2013 for failure to thrive. Prior to that she had a prolonged hospital stay and was intubated for severe COPD exacerbation. The patient reports that she has lost her Medicaid insurance and has not seen any physician since March of 2013. Prior to that she was seeing Dr. Juel Burrow and Dr. Meredeth Ide. She reports that she was able to get off oxygen and steroids. In fact, she has not taken any medications other than her inhalers and Symbicort in the last several months because of lack of insurance. She is not taking anything for anxiety, depression, or pain any longer. She is also not using any oxygen. She was well until a couple of days ago when she started having some headache and sinus drainage which progressed to shortness of breath and severe coughing. She is not able to bring up any expectoration. Denies any fever. Denies any nausea, vomiting, diarrhea, or abdominal pain.   PAST MEDICAL HISTORY: 1. COPD. The patient is no longer using any oxygen or steroids. 2. History of congestive heart failure. This is questionable since the patient's last echo done in September 2012 showed normal LV systolic and diastolic function. The patient did have some elevated right ventricular systolic pressure of 30 to 40 mmHg.  3. 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 4. Chronic pain syndrome.  5. Anxiety/depression. 6. History of gallstones with mildly elevated LFTs.   ALLERGIES: Amoxicillin and codeine.   MEDICATIONS: The patient reports  that she has not been taking any medications other than her albuterol nebulizer and Symbicort.   SOCIAL HISTORY: The patient quit smoking more than a year ago. She has a 15 to 20 pack-year smoking history. Denies any alcohol or drug abuse. She is married, lives with her husband.   FAMILY HISTORY: Mother died of complications of heart disease and COPD. Father had emphysema which was oxygen dependent.   REVIEW OF SYSTEMS: CONSTITUTIONAL: Denies any fever. Reports fatigue, weakness. EYES: Denies any blurred or double vision. ENT: Denies any tinnitus, ear pain. RESPIRATORY: Reports cough, wheezing. CARDIOVASCULAR: Denies any chest pain. Reports palpitations. GI: Denies any nausea, vomiting, diarrhea, or abdominal pain. GU: Denies any dysuria or hematuria. ENDOCRINE: Denies any polyuria or nocturia. HEME/LYMPH: Denies any anemia or easy bruisability. INTEGUMENTARY: Denies any acne or rash. MUSCULOSKELETAL: Denies any swelling, gout. NEUROLOGICAL: Denies any numbness, weakness. PSYCH: Has history of anxiety and depression, not on any medications currently.   PHYSICAL EXAMINATION:   VITAL SIGNS: Temperature 97.6, heart rate 126, respiratory rate 26, blood pressure 161/69, pulse oximetry 90%.   GENERAL: The patient is a 49 year old female sitting in bed. She is tachypneic and using accessory muscles of respiration.   HEAD: Atraumatic, normocephalic.   EYES: There is no pallor, icterus, or cyanosis. Pupils equal, round, and reactive to light and accommodation. Extraocular movements intact.   ENT: Wet mucous membranes. No oropharyngeal erythema or thrush.   NECK: Supple. No masses. No JVD. No thyromegaly.   CHEST WALL: No tenderness to palpation. The patient is using accessory muscles of respiration and is  tachypneic. She has no tenderness to palpation.   LUNGS: The patient has bilateral wheezing, rhonchi, and coarse breath sounds.   CARDIOVASCULAR: S1, S2 regular, tachycardic. No murmur, rubs, or  gallops.   ABDOMEN: Soft, nontender, nondistended. No guarding or rigidity. No organomegaly.   SKIN: No rashes or lesions.   PERIPHERIES: No pedal edema. 2+ pedal pulses.   MUSCULOSKELETAL: No cyanosis or clubbing.   NEUROLOGIC: Awake, alert, oriented x3. Nonfocal neurological exam. Cranial nerves grossly intact.   PSYCH: Normal mood and affect.   LABORATORY, DIAGNOSTIC, AND RADIOLOGICAL DATA: Chest x-ray possible fibrosis of the right lung apex, postoperative changes in the left upper lobe. No significant interval change. Patchy infiltrate cannot be excluded but seems less likely.  CBC white count 4.5, hemoglobin 12.5, hematocrit 38.3, platelets 212, glucose 78, BUN 14, creatinine 0.62, sodium 146, potassium 3.3, chloride 111, CO2 26, calcium 8.3, bilirubin 0.1, alkaline phosphatase 138, ALT 35, AST 40. Cardiac enzymes normal.   EKG shows sinus tachycardia.   ASSESSMENT AND PLAN: This is a 49 year old female with history of COPD, BOOP, chronic pain syndrome, anxiety and depression who presents with sinus congestion, shortness of breath, cough.  1. Acute respiratory failure due to acute COPD exacerbation. The patient reports that she is no longer oxygen or osteoid dependent. In fact, she has not taken any medications for several months other than albuterol and Symbicort. Will admit the patient to the hospital and start her on IV steroids, empiric antibiotics. Continue her Symbicort. Place her on nebulizer treatments and Spiriva.  2. Tachycardia with mildly elevated blood pressure. This could be related to acute stress. The patient has been tachycardic in the past with COPD exacerbation. Will treat her conservatively for the time being. Will use Xopenex since it causes less tachycardia.  3. Mild hypernatremia. This could be related to dehydration. Will give the patient gentle IV fluid hydration with half normal saline. 4. Hypokalemia. Will replace p.o. and recheck in a.m.  5. Elevated ALP and  AST. The patient has had elevated liver enzymes in the past. She has history of gallstones during her last admission in January. She had abdominal ultrasound which had shown gallstones. In addition, a hepatobiliary scan was essentially normal.  6. Hyperlipidemia. The patient reports that she has not taken any medications for several months, therefore, will not resume.  7. History of chronic pain syndrome, anxiety/depression. The patient has not taken any anxiety medications or pain medications for the last several months. She appears mildly anxious at present, therefore, will start her on Xanax p.r.n.   DISPOSITION: The patient is going to require a PCP at disposition. She has not seen any physician since March of 2013.  Reviewed old medical records, discussed with the patient and husband the plan of care and management.   TIME SPENT: 75 minutes.   ____________________________ Darrick MeigsSangeeta Aliena Ghrist, MD sp:drc D: 07/21/2012 10:12:33 ET T: 07/21/2012 10:31:10 ET JOB#: [id number]  cc: Darrick MeigsSangeeta Syrena Burges, MD, <Dictator> Darrick MeigsSANGEETA Clarabell Matsuoka MD ELECTRONICALLY SIGNED 07/22/2012 12:12

## 2015-01-04 DIAGNOSIS — I119 Hypertensive heart disease without heart failure: Secondary | ICD-10-CM | POA: Diagnosis not present

## 2015-01-04 DIAGNOSIS — I429 Cardiomyopathy, unspecified: Secondary | ICD-10-CM | POA: Diagnosis not present

## 2015-01-04 DIAGNOSIS — J449 Chronic obstructive pulmonary disease, unspecified: Secondary | ICD-10-CM | POA: Diagnosis not present

## 2015-01-04 DIAGNOSIS — J439 Emphysema, unspecified: Secondary | ICD-10-CM | POA: Diagnosis not present

## 2015-01-04 DIAGNOSIS — R079 Chest pain, unspecified: Secondary | ICD-10-CM | POA: Diagnosis not present

## 2015-01-06 NOTE — Consult Note (Signed)
PATIENT NAME:  Caitlyn Evans, Caitlyn Evans MR#:  [id number] DATE OF BIRTH:  March 18, 1966  DATE OF CONSULT:  04/06/2013  CONSULTING PHYSICIAN:  Cristal Deerhristopher A. Edell Mesenbrink, MD  REASON FOR CONSULT:  Left forearm abscess.    HISTORY OF PRESENT ILLNESS: Miss Caitlyn BallWorkman is a pleasant, 49 year old female, who obtained a traumatic left forearm injury on July 4. She has presented to the ED multiple times with a large, painful hematoma on the dorsum of her left forearm. It measured approximately 3 x 5 cm. It has not gotten smaller, and it is painful when she moves her wrist and hand. The Emergency Room has attempted to drain her abscess previously with needle aspiration that was not successful. She otherwise had no other injuries. No fevers, chills, redness, drainage from incision. No chest pain, shortness of breath, cough, abdominal pain, nausea, vomiting, diarrhea, constipation. No dysuria or hematuria. No neurologic changes.   PAST MEDICAL HISTORY: 1.  COPD.   2.  CHF.   3.  Respiratory failure.  4.  Pneumonia.  5.  Panic attacks.  6.  Hypercholesterolemia.  7.  Asthma.  8.  History of tracheostomy.  9.  History of hysterectomy.   HOME MEDICATIONS:  1.  Symbicort.  2.  DuoNeb.  4.  Aspirin.  5.  Amitriptyline. 6.  Xanax.   ALLERGIES:  1.  AMOXICILLIN.  2.  CODEINE.   SOCIAL HISTORY:  [place] in [place]. Denies current tobacco use, but does have COPD from previous tobacco use. Denies alcohol.   FAMILY HISTORY:  Noncontributory.  REVIEW OF SYSTEMS: A 12-point review of systems was obtained. Pertinent positives and negatives as above.   PHYSICAL EXAMINATION: VITAL SIGNS: Temperature 98.4, pulse 98, blood pressure 190/67, respirations 18, 98% on room air.  GENERAL:  No acute distress. Alert and oriented x 3.  HEAD:  Normocephalic, atraumatic.  EYES:  No scleral icterus. No conjunctivitis.  FACE:  No obvious facial trauma. Normal external nose. Normal external ears.  CHEST:  Lungs clear to  auscultation, moving air well.  HEART:  Regular rate and rhythm. No murmurs, rubs or gallops.  ABDOMEN:  Soft, nontender, nondistended.  EXTREMITIES:  Has approximately a 5 x 3 x 2 cm left forearm hematoma. No obvious erythema. It is tender to palpation. No induration. No drainage. Does have a less raised ecchymoses on her left upper extremity as well.  NEUROLOGIC: Cranial nerves II through XII grossly intact. Sensation intact. Hand function bilaterally intact.   LABS:  No current labs.  IMAGING:  X-ray on July 17 of left wrist showed no obvious acute bony abnormality.   ASSESSMENT AND PLAN:  Miss Caitlyn BallWorkman is a pleasant, 49 year old with a persistent left arm hematoma, which has not resolved. I do not see any skin compromise; however, it is painful. I have spoken to her about evacuation of hematoma, and she wants to pursue this course. Will perform I and D, and after incision and drainage, will have followup as outpatient to assure healing.    ____________________________ Si Raiderhristopher A. Kameryn Tisdel, MD cal:mr D: 04/06/2013 18:10:58 ET T: 04/06/2013 19:13:28 ET JOB#: [id number]  cc: Cristal Deerhristopher A. Navy Belay, MD, <Dictator> Jarvis NewcomerHRISTOPHER A Kaia Depaolis MD ELECTRONICALLY SIGNED 04/10/2013 11:39

## 2015-01-06 NOTE — Op Note (Signed)
PATIENT NAME:  Caitlyn Evans, [name] MR#:  [id number] DATE OF BIRTH:  Sep 13, 1966  DATE OF PROCEDURE:  04/06/2013  PREOPERATIVE DIAGNOSIS: Left arm hematoma measuring 5 x 3 cm.   POSTOPERATIVE DIAGNOSIS: Left arm hematoma measuring 5 x 3 cm.  PROCEDURE PERFORMED: Incision and drainage of left dorsal forearm hematoma,  5 x 3 cm.   SURGEON: Shadrick Senne A. Baeleigh Devincent, MD   ANESTHESIA: 1% lidocaine with epinephrine.   ESTIMATED BLOOD LOSS: Total volume of abscess, approximately 40 mL.   COMPLICATIONS: None.   SPECIMENS: None.   INDICATIONS FOR PROCEDURE: [name] is a pleasant 49 year old female who sustained a traumatic forearm subcutaneous tissue injury on July 4th. She had presented to the ED on multiple occasions with a large hematoma on her forearm which was painful. She did have attempt to have it aspirated, and it did not. I had offered her incision and drainage, and she wanted to proceed with this plan.   DETAILS OF PROCEDURE: Informed consent was obtained from [name]. Her left arm was then prepped and draped in standard surgical fashion. A timeout was then performed correctly identifying the patient name, operative site and procedure to be performed; 1% lidocaine with epinephrine was used to anesthetize her hematoma and the length of it.  Once it was nice and anesthetized, I made an incision with a 10 blade scalpel, and there was a large amount of brown old clot. This was expressed until it was evacuated. There was a cavity with a fibrinous rind but which was free of blood. I then placed and packed the wound with iodine-soaked 4 x 4 gauze and placed a sterile dressing over the wound. I then wrapped her arm with a Kerlix gauze and an Ace bandage.   There were no immediate complications. Needle, sponge, and instrument counts were correct at the end of the procedure.    ____________________________ Si Raiderhristopher A. Tahra Hitzeman, MD cal:cb D: 04/06/2013 18:05:07 ET T: 04/06/2013 20:33:16  ET JOB#: [id number]  cc: Cristal Deerhristopher A. Susane Bey, MD, <Dictator> Jarvis NewcomerHRISTOPHER A Veralyn Lopp MD ELECTRONICALLY SIGNED 04/10/2013 11:39

## 2015-01-08 NOTE — H&P (Signed)
PATIENT NAME:  Caitlyn Evans, Caitlyn Evans MR#:  [id number] DATE OF BIRTH:  04/11/1966  DATE OF ADMISSION:  09/17/2011  PRIMARY CARE PHYSICIAN: Corky DownsJaved Masoud, MD  CHIEF COMPLAINT: Shortness of breath.   HISTORY OF PRESENT ILLNESS: This is a 49 year old female who comes in from home complaining of chest pain and shortness of breath. The patient has a long time history of chronic obstructive pulmonary disease and pneumonia. She was in the hospital in October of last year with very similar symptoms. She was discharged home on a very long prednisone taper. 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 For the past four days the patient has been having chest pain which is pruritic in nature and also worsening shortness of breath where she has been using her nebulizer treatments a lot more frequently. She was a bit concerned and came to the ER for further evaluation. She was noted to be hypoxic, tachycardic and in worsening respiratory failure. Hospitalist service was then contacted for further treatment and evaluation. The patient admits to chest pain, which as I said is more pleuritic in nature. It is somewhat reproducible but nonradiating. She denies any fevers, chills, nausea, vomiting, abdominal pain, headache, dizziness, or any other associated symptoms presently.   REVIEW OF SYSTEMS: CONSTITUTIONAL: No documented fever. No weight gain or weight loss. EYES: No blurred or double vision. ENT: No tinnitus or postnasal drip. No redness of the oropharynx. RESPIRATORY: Positive cough. Positive wheeze. No hemoptysis. Positive dyspnea. CARDIOVASCULAR: Positive chest pain. No orthopnea, no palpitations, and no syncope. GI: No nausea, no vomiting, no diarrhea, no abdominal pain, no melena, and no hematochezia. GU: No dysuria or hematuria. ENDOCRINE: No polyuria or nocturia. No heat or cold intolerance.  HEME: No anemia, no bruising,  and no bleeding. INTEGUMENTARY: No rashes and no lesions. MUSCULOSKELETAL: No arthritis, no swelling, and no gout. NEUROLOGIC: No numbness, no tingling, no ataxia, and no seizure activity. PSYCH: No anxiety, no insomnia, and no ADD.   PAST MEDICAL HISTORY:  1. Hyperlipidemia.  2. Chronic obstructive pulmonary disease, oxygen dependent.  3. Restless leg syndrome. 4. Chronic pain.  5. Anxiety/depression.  6. History of chronic respiratory failure secondary to chronic obstructive pulmonary disease. 7. History of bronchiolitis obliterans pneumonia.   ALLERGIES: Amoxicillin and codeine.   SOCIAL HISTORY: She used to be a smoker, quit about a year or so ago. She does have about 15 to 20 pack-year smoking history. No alcohol abuse. No illicit drug abuse. She currently lives at home with her husband.   FAMILY HISTORY: Father is alive and has emphysema. He is on oxygen. Mother died from complications of chronic obstructive pulmonary disease and heart disease.   CURRENT MEDICATIONS:  1. Albuterol ipratropium nebulizers four times daily as needed.  2. Amitriptyline 10 mg at bedtime. 3. Aspirin 81 mg daily.  4. Celexa 40 mg daily.  5. Klonopin 1 mg three times daily. 6. Remeron 15 mg at bedtime.  7. Oxycodone 5 mg 1 to 2 tabs four times daily as needed. 8. Requip 0.25 mg twice a day. 9. Simvastatin 20 mg daily.  10. Spiriva 1 puff daily.  11. Symbicort 160/4.5 mg 2 puffs twice a day.  ADMISSION PHYSICAL EXAMINATION:   VITAL SIGNS: Temperature 99.1, pulse 119, respirations 24, blood pressure 105/69, and saturation 100% on 2 liters nasal cannula.  GENERAL: She is a pleasant appearing female in mild respiratory distress.   HEENT:  Head is atraumatic, normocephalic. Her extraocular muscles are intact. Pupils are equally round and reactive to light. Sclerae are anicteric. No conjunctival injection. No pharyngeal erythema.   NECK: Supple. No jugular venous distention, bruits, lymphadenopathy, or  thyromegaly.   HEART: Tachycardic, regular. No murmurs, rubs, or clicks.   LUNGS: She has minimal diffuse wheezing. Negative use of accessory muscles. No dullness to percussion.   ABDOMEN: Soft, flat, nontender, and nondistended. Good bowel sounds. No hepatosplenomegaly is appreciated.   EXTREMITIES: No evidence of any cyanosis, clubbing, or peripheral edema. +2 pedal and radial pulses bilaterally.   NEUROLOGICAL: Alert, awake, and oriented x3 with no focal motor or sensory deficits appreciated bilaterally.   SKIN: Moist and warm with no rash appreciated.   LYMPHATIC: No cervical or axillary lymphadenopathy.   LABS/STUDIES: Serum glucose 123. BNP 74. BUN 9, creatinine 0.7, sodium 142, potassium 3.4, chloride 107, and bicarbonate 26. Liver function tests are within normal limits. Troponin is less than 0.02. White cell count 12.6, hemoglobin 10.1, hematocrit 31.6 and platelet count 410.   Chest x-ray: Slight interval decrease in interstitial pulmonary opacities.   ASSESSMENT AND PLAN: This is a 49 year old female with history of respiratory failure, chronic obstructive pulmonary disease, history of BOOP, hyperlipidemia, and history of anxiety and depression who came to the hospital due to chest pain and shortness of breath.  1. Acute on chronic respiratory failure: Likely this is in the setting of chronic obstructive pulmonary disease exacerbation from possible underlying BOOP/atypical pneumonia. The patient has been off steroids now over a month. For now I will start the patient on high-dose IV steroids 60 mg every 6 hours, continue around-the-clock nebulizer treatments, and empiric antibiotics. We will also get a pulmonary consult. I discussed the case with Dr. Erin Fulling who will see the patient tomorrow.  2. Chronic obstructive pulmonary disease exacerbation: This is likely in the setting of either atypical pneumonia or bronchiolitis obliterans pneumonia. The patient has a history of  bronchiolitis obliterans pneumonia, as mentioned. She has been off her steroids for about a month or so now. We will go ahead and start her on IV steroids, continue empiric Levaquin, continue her inhalers of Symbicort and Spiriva and nebulizer treatments, and we will get a pulmonary consult as stated.  3. Hyperlipidemia: Continue simvastatin.  4. Anxiety/depression: Continue Remeron, Klonopin, and Celexa. 5. Restless leg syndrome: Continue Requip.   CODE STATUS: FULL CODE.  TIME SPENT: 50 minutes. ____________________________ Rolly Pancake. Cherlynn Kaiser, MD vjs:slb D: 09/17/2011 18:22:29 ET T: 09/18/2011 07:53:25 ET JOB#: [id number]  cc: Rolly Pancake. Cherlynn Kaiser, MD, <Dictator> Corky Downs, MD [place] Siren MD ELECTRONICALLY SIGNED 09/20/2011 21:33

## 2015-01-08 NOTE — Consult Note (Signed)
PATIENT NAME:  Caitlyn Evans, Caitlyn Evans MR#:  [id number] DATE OF BIRTH:  1965/12/22  DATE OF CONSULTATION:  10/01/2011  REFERRING PHYSICIAN:  Ned ClinesHerbon Fleming, MD CONSULTING PHYSICIAN:  Annice NeedyJason S. Tonae Livolsi, MD  REASON FOR CONSULTATION:  Central venous catheter.  HISTORY OF PRESENT ILLNESS: This is a 49 year old white female with advanced lung disease, who was transferred to the Critical Care Unit and intubated this evening.  The patient cannot provide any of her history. This is obtained from the previous medical record.   PAST MEDICAL HISTORY:  1. BOOP. 2. Chronic obstructive pulmonary disease with chronic oxygen dependence and  steroid dependence. 3. Congestive heart failure.  4. Anxiety and depression.  5. Hyperlipidemia.   ALLERGIES: Amoxicillin and codeine.   SOCIAL HISTORY: She apparently quit about a year ago, smoking heavily prior to that. No illicit drug use. Lives at home with her husband.   FAMILY HISTORY: Father had emphysema on oxygen and is alive. Mother died from chronic obstructive pulmonary disease as well.   HOME MEDICATIONS:  1. Albuterol nebulizers.  2. Amitriptyline 10 mg at bedtime.  3. Aspirin 81 mg daily.  4. Citalopram 40 mg daily.  5. Clonazepam 1 mg t.i.d. as needed.  6. Remeron 15 mg at bedtime.  7. Oxycodone 5 mg q.i.d.  8. Oxygen 2 liters continuously.  9. Ropinirole 0.25 mg b.i.d.  10. Simvastatin 20 mg at bedtime.  11. Spiriva daily.  12. Symbicort.  13. Prednisone taper, but is on it chronically as well.   REVIEW OF SYSTEMS:  Unobtainable due to her being intubated.   PHYSICAL EXAMINATION:  GENERAL: This is a chronically ill-appearing white female who appears much older than her stated age.   VITAL SIGNS: Temperature 97.9, pulse 108, blood pressure 166/79, saturation 95%.   HEENT: Endotracheal and orogastric tubes are in place.   EYES: Sclerae anicteric. Conjunctivae are clear.   EARS: Normal external appearance. Unable to assess hearing.   NECK:  Supple without adenopathy or jugular venous distention.   HEART: Tachycardic but regular.   LUNGS: Diminished with rhonchi and wheezes bilaterally.   EXTREMITIES: Mild lower extremity edema.   SKIN: Warm and dry.   NEUROLOGIC: Unable to assess on sedation.   LABORATORY DATA: Sodium 142, potassium 4, chloride 104, CO2 28, BUN 14, creatinine 0.67, glucose 187 from 01/14.  CBC showed a white count of 24.6, hemoglobin 8.6, platelet count 255,000.   ASSESSMENT AND PLAN: This is a 49 year old white female with respiratory failure and poor venous access requiring multiple IV medications. Central line will be placed at the bedside.    This is a level-3 consultation.    ____________________________ Annice NeedyJason S. Wilsie Kern, MD jsd:bjt D: 10/03/2011 10:12:04 ET T: 10/03/2011 11:08:18 ET JOB#: [id number]  cc: Annice NeedyJason S. Pharoah Goggins, MD, <Dictator> Annice NeedyJASON S Cornelius Marullo MD ELECTRONICALLY SIGNED 10/30/2011 11:14

## 2015-01-08 NOTE — Discharge Summary (Signed)
PATIENT NAME:  Heide ScalesWORKMAN, Gearldine C MR#:  [id number] DATE OF BIRTH:  1965-11-27  DATE OF ADMISSION:  09/29/2011 DATE OF DISCHARGE:  10/09/2011  HISTORY OF PRESENT ILLNESS:  Audry Rilesammy Oshana is a 49 year old female with a past history of BOOP and upper respiratory infection who was admitted to the hospital through the Emergency Room with bilateral infiltrates in the lungs. It was thought that the patient had systemic inflammatory response secondary to pneumonia.  The patient was on prednisone at the time of admission. The patient's other problems include history of chronic obstructive pulmonary disease, congestive heart failure, history of BOOP, chronic pain syndrome, anxiety and depression, history of cholelithiasis. The patient used to smoke and quit smoking about a year ago. She smoked continuously for 15 to 20 years. She is on multiple medications, well outlined in her history, but unfortunately the patient has not been taking her medication for the last 1 or 2 months and not following a diet.   ACCESSORY CLINICAL DATA:  WBC count was 34,500, hemoglobin and hematocrit 9.2 and 39.4, platelet count 285, glucose 165, calcium 7.7, SGPT 88, SGOT 47, albumin 2.5, troponin is negative.   ADMITTING DIAGNOSES:  1. Systemic inflammatory response due to pneumonia. 2. Acute on chronic hypoxic respiratory failure due to chronic obstructive pulmonary disease and BOOP. 3. Leukocytosis. 4. Abdominal pain suggestive of gallbladder disease.  5. Restless leg syndrome.   During the hospitalization the patient was seen by Dr. Meredeth IdeFleming. The patient was still wheezing on 01/14 on broad coverage for chronic obstructive pulmonary disease. There was no chest pain, but complained of abdominal pain. The pulmonologist also thought that the patient's symptoms were due to left lower lobe pneumonia with a flare-up of BOOP secondary to pneumonia. The patient has had multiple admissions to this hospital for similar problems. Dr. Meredeth IdeFleming  added theophylline to the present regimen with transfer to the Intensive Care Unit if she got worse. On 01/15 the patient had more difficulty breathing and wheezing. She was very anxious. She was transferred to the Intensive Care Unit. She was tachycardic, respirations were 35, and she was using accessory muscles. The patient was intubated and put on Versed and fentanyl drip. She was continued on IV steroid and other medications including deep vein thrombosis prophylaxis. The patient was also seen in consultation by vascular surgeon who put in a CVP line. On 01/17  the bronchospasm was brought under control. On 01/18 the patient was extubated. The patient  did well after extubation. There was no wheezing, no shortness of breath. She was transferred to floor care.  She had an ultrasound done of the gallbladder and was found to have stones. The patient wanted her gallbladder disease to be evaluated by a surgeon so she was seen by a surgical specialist and he ordered a HIDA scan. HIDA showed good function of the gallbladder. The patient is very sick and cannot tolerate any kind of surgery at the present time. It was decided to send her to a nursing home for further rehab. She will need BiPAP for sleep apnea. We will continue theophylline, PPI, albuterol, Symbicort, and Spiriva.   FINAL DIAGNOSES:  1. Systemic inflammatory response secondary to pneumonia on the left side with flare-up of BOOP. 2. Chronic obstructive pulmonary disease with emphysema. 3. Bilateral wheezing in the lungs secondary to BOOP. 4. Exogenous obesity. 5. Leukocytosis. 6. Gallstones.  MEDICATIONS: The patient will be discharged to the nursing home on the following medications: 1. Albuterol and ipratropium q. 6 hours.  2. Theophylline 200 mg SA twice a day. 3. Ranitidine 150 mg p.o. b.i.d. 4. Vasotec 20 mg p.o. daily.  5. Lorazepam 1 mg p.o. twice a day. 6. Robitussin cough syrup 1 teaspoon 4 times a day. 7. Simvastatin 20 mg p.o.  daily.  8. Budesonide inhaler 160/4.5, 1 puff twice a day.  9. Celexa 20 mg p.o. daily. 10. Clonazepam 1 mg p.o. twice a day.  At the time of discharge potassium was 3.9, sodium 141, chloride 100, temperature 98.8, pulse rate was 89, systolic blood pressure 134/70. Oximetry was 97% on BiPAP.    It is also to be noted that the patient will require BiPAP to sleep at night and two liters oxygen per minute.  She will also need followup with a pulmonary specialist.    ADDITIONAL DIAGNOSES:  1. Congestive heart failure due to diastolic dysfunction.  2. Hyperlipidemia.  3. Panic attacks with anxiety neurosis.  4. Chronic obstructive pulmonary disease/asthma.  5. Status post  hysterectomy.  The patient will need physical therapy and followup with a surgeon for the gallbladder disease.    ____________________________ Corky Downs, MD jm:bjt D: 10/09/2011 17:29:57 ET T: 10/10/2011 12:43:09 ET JOB#: [id number]  cc: Corky Downs, MD, <Dictator> Corky Downs MD ELECTRONICALLY SIGNED 10/13/2011 16:34

## 2015-01-08 NOTE — Consult Note (Signed)
PATIENT NAME:  Caitlyn Evans, Caitlyn Evans MR#:  [id number] DATE OF BIRTH:  Mar 31, 1966  DATE OF CONSULTATION:  10/07/2011  REFERRING PHYSICIAN:   CONSULTING PHYSICIAN:  Sanjay Broadfoot S. Kazimir Hartnett, MD  REASON FOR CONSULTATION: Right upper quadrant abdominal pain.   HISTORY OF PRESENT ILLNESS: The patient had an ultrasound of the gallbladder done which showed gallstone. The patient's date of admission was 09/29/2011. This patient is a 49 year old obese female with a past medical history of chronic respiratory failure and history of BOOP in the past who was recently admitted from 01/01 to 09/23/2011 and discharged with a diagnosis of flare of her BOOP with antibiotics and prednisone taper. She came back to the Emergency Room again with not feeling well, wheezing, and has dry hacking cough. In the Emergency Room, chest x-ray showed significant infiltrates in the left lung. The patient's x-ray on 09/23/2011 showed lung fields clear except for some bibasilar atelectasis. The patient is being admitted for further evaluation of her SIRS due to her left-sided pneumonia.   PAST MEDICAL HISTORY:  1. Hyperlipidemia.  2. Chronic obstructive lung disease. 3. Congestive heart failure. 4. History of BOOP. 5. Chronic pain syndrome. 6. Anxiety and depression. 7. History of cholelithiasis diagnosed on ultrasound of the abdomen done on 09/23/2011.  ALLERGIES: Amoxicillin and codeine.   SOCIAL HISTORY: She used to be a smoker, quit about a year ago. She has a history of 5 to 20 pack per year smoking history. No alcohol use. No drug use.   FAMILY HISTORY: Father is alive and has emphysema. He is on oxygen. Mother died from complications of chronic pulmonary disease and heart disease.   CURRENT MEDICATIONS: She is on a lot of medications. 1. Albuterol. 2. Amitriptyline. 3. Aspirin. 4. Citalopram. 5. Clonazepam. 6. Remeron. 7. Oxycodone. 8. Oxygen. 9. Simvastatin. 10. Spiriva. 11. Symbicort. 12. The patient currently is on  prednisone taper 10 mg p.o. daily indefinitely on a chronic basis.   When she was admitted to this hospital, she was also intubated for her lung problem and now recently she has been extubated. She says she has this pain in the right upper quadrant of the abdomen for a long time for which she is taking pain medication. I was asked to evaluate her for gallbladder disease.   PHYSICAL EXAMINATION:   GENERAL: She is breathing with oxygen on. She is an obese female, alert and oriented, not in acute distress.   VITAL SIGNS: Afebrile, pulse 104, respirations 32.   HEENT: Normal.   NECK: Supple.   RESPIRATORY: The patient has coarse breath sounds, some wheezing, expiratory bilaterally wheezing. No rales heard. No accessory muscles of the lungs showing.  NEUROLOGIC: Okay.    ABDOMEN: Soft, tender in the right upper quadrant of the abdomen. It looked like she is chronically tender here for a long time.  LABORATORY, DIAGNOSTIC, AND RADIOLOGICAL DATA: White count at the time of admission was 34,000. This is probably due to prednisone. Hemoglobin 9.2. Glucose 165. EKG shows tachycardia.   CODE STATUS: The patient is FULL CODE.   IMPRESSION: This patient has a very bad chronic obstructive lung disease, emphysema, and chronic obstructive pulmonary disease. She recently was extubated from the respirator and she is going to be sent to the rehab. At this time she is complaining of pain in the right upper quadrant of the abdomen. I am not really sure if it is really due to acute cholecystitis or not so the best thing I want to do for her is to  order a HIDA scan and see if the gallbladder is visualized. If the gallbladder is not visualized, then she will need surgery in this setting. If the gallbladder is visualized, then I want her to be a little better lung-wise because she will have trouble coming off the respirator. I am going to follow this patient with you.   ____________________________ Alton Revere.  Cecelia Byars, MD msh:drc D: 10/07/2011 17:01:03 ET T: 10/08/2011 07:26:30 ET JOB#: [id number]  cc: Arnoldo Hildreth S. Cecelia Byars, MD, <Dictator> Meryle Ready MD ELECTRONICALLY SIGNED 10/08/2011 13:02

## 2015-01-08 NOTE — Discharge Summary (Signed)
PATIENT NAME:  Caitlyn Evans, Caitlyn Evans MR#:  [id number] DATE OF BIRTH:  [birth year]  DATE OF ADMISSION:  10/13/2011 DATE OF DISCHARGE:  10/16/2011  HISTORY OF PRESENT ILLNESS: The patient is a 49 year old female with a history of chronic obstructive pulmonary disease and BOOP who was discharged about three days ago from the hospital. She came back with new weakness and failure to thrive. She was seen in the Emergency Room and was admitted into the hospital. For the rest of details of the history and physical, please see typed history and physical sheet.   ACCESSORY CLINICAL DATA: Chest x-ray revealed chronic basilar infiltrate. CBC revealed WBC count of 21,800, hemoglobin 13.4, glucose 116, BUN 28, creatinine 0.82, sodium 135. Troponin was 0.06. Lipase was 91. Urinalysis was negative.   ADMITTING DIAGNOSES:  1. Failure to thrive. 2. Generalized weakness. 3. Exacerbation of respiratory failure. 4. Chronic obstructive pulmonary disease.   LAB DATA: Blood sugar 167 and 169, creatinine 0.42, 0.08 sodium 145, 146. At the time of discharge glucose was 77, BUN 28, creatinine 0.42, sodium 135, potassium 3.2, albumin 4.4. Blood cultures were negative. Urine cultures were negative. Mixed culture growth was noted suggesting contamination. Blood gas on admission revealed pH 7.46, pCO2 56, oxygen saturation 98%. Blood gas on January 27th revealed pH 7.49, pO2 48, bicarb 32, pCO2 of 42.   HOSPITAL COURSE: The patient was seen by me through the course. She improved slowly and gradually and discharged home on her previous medication. She was stable enough to go home this time and was able to walk so we are comfortable sending her home. The patient was advised to return if she has anymore trouble.   At the time of discharge, temperature 98, pulse 107, respiration 18.   DISCHARGE MEDICATIONS:  1. Symbicort. 2. Spiriva.  3. Simvastatin. 4. Ropinirole. 5. Ranitidine. 6. Protonix.  7. Potassium chloride 20 mEq p.o.  daily.  8. Oxygen 2 liters at night.  9. Cardizem 30 mg 1 tablet 4 times a day. 10. Celexa 20 mg p.o. daily.  11. Celexa 40 mg p.o. daily.  12. Aspirin 81 mg p.o. daily.   Chest x-ray at the time of discharge revealed mild hyperinflation. Mediastinal structures were unremarkable. No effusion was noted. There is no evidence of congestive heart failure   FINAL DIAGNOSIS: Acute exacerbation of acute respiratory failure most likely secondary to noncompliance with medications.   ____________________________ Corky DownsJaved Tameko Halder, MD jm:drc D: 10/27/2011 13:40:11 ET T: 10/27/2011 14:42:57 ET JOB#: [id number]  cc: Corky DownsJaved Davit Vassar, MD, <Dictator> Corky DownsJAVED Kristen Fromm MD ELECTRONICALLY SIGNED 11/03/2011 18:37

## 2015-01-08 NOTE — Consult Note (Signed)
Brief Consult Note: Diagnosis: acute cholysystitis ?Marland Kitchen.   Patient was seen by consultant.   Recommend further assessment or treatment.   Orders entered.   Comments: i am going to order hida scan to see wether gall bladder is visulised indicating if it is acute or not .she is very risky for surgery specially post op coming off the respirator will she be cleared for surgery?.  Electronic Signatures: Jovita GammaHashmi, Dariella Gillihan (MD)  (Signed 21-Jan-13 16:53)  Authored: Brief Consult Note   Last Updated: 21-Jan-13 16:53 by Jovita GammaHashmi, Samul Mcinroy (MD)

## 2015-01-08 NOTE — Consult Note (Signed)
Chief Complaint:   Subjective/Chief Complaint she says she feels better   Brief Assessment:   Cardiac Regular    Respiratory postive use of accessory muscles    Gastrointestinal Normal    Gastrointestinal details normal Soft  No masses palpable    Additional Physical Exam pt says it hurts her in right upper quadrent and when we talk to her pain is not elicited ,i looked at hida scan and gall bladder is well visulised and does no show obstruction   Radiology Results: Nuclear Med:    22-Jan-13 14:20, Hepatobiliary Image - Nuc Med   Hepatobiliary Image - Nuc Med    REASON FOR EXAM:    abdominal pain  COMMENTS:       PROCEDURE: NM  - NM HEPATOBILIARY IMAGE  - Oct 08 2011  2:20PM     RESULT:     The patient received 8.4 mCi Tc-1037m labeled Choletec. Standard imaging of   the abdomen was obtained.    Findings: There is diffuse homogeneous radiotracer activity within the   liver. Intrahepatic biliary ductal dilatation is identified at 10 minutes   with common bile duct and gallbladder activity at 15 minutes. Drainage   into the small bowel is appreciated at 75 minutes and peristalsis.    IMPRESSION:      Unremarkable hepatobiliary evaluation as described above.     Thank you for the opportunity to contribute to the care of your patient.           Verified By: Jani FilesHECTOR W. COOPER, M.D., MD   Assessment/Plan:  Assessment/Plan:   Assessment i am not sure that her pain is from the acute cholysystitis.Her visulised gall bladder is normal and radioactive tracer is in the small bowel indicating no acute cholysystitis.due to her medical condition and renet intubation due to copd .she will have hard time come off the respirator    Plan reavaluation when her pulmonary condition improves   Electronic Signatures: Jovita GammaHashmi, Catalaya Garr (MD)  (Signed 22-Jan-13 17:13)  Authored: Chief Complaint, Brief Assessment, Radiology Results, Assessment/Plan   Last Updated: 22-Jan-13 17:13 by Jovita GammaHashmi,  Christiano Blandon (MD)

## 2015-01-08 NOTE — Discharge Summary (Signed)
PATIENT NAME:  Caitlyn Evans, Caitlyn Evans MR#:  [id number] DATE OF BIRTH:  1965-10-25  DATE OF ADMISSION:  09/29/2011 DATE OF DISCHARGE:  10/10/2011  HISTORY OF PRESENT ILLNESS: [name] is a 49 year old female with a past medical history of chronic respiratory failure and BOOP who has been recently discharged from the hospital on 09/23/2011 and was admitted again after developing more trouble breathing. In the Emergency Room, chest x-ray showed significant infiltrate in the left lower lobe. Chest x-ray done on 01/07 was clear except for atelectasis in the bases. The patient received IV antibiotics. The patient quit smoking about a year ago. She had a 15 to 20 pack history of smoking. There is no history of alcohol abuse.   ADMITTING DIAGNOSES:  1. SIRS due to left-sided pneumonia. 2. Acute hypoxia.  3. Leukocytosis.  4. History of BOOP. 5. Abdominal pain due to gallbladder disease.  HOSPITAL COURSE: The patient was admitted into the hospital. The patient was started on IV meropenem and started on nebulized treatment and inhaler. Pulmonary consultation was also called in. Heparin was started for deep venous thrombosis prophylaxis. Pulmonary followed the patient's COPD, asthma exacerbation with marginal oxygenation with a component of anxiety, left-sided pneumonia. Dr. Meredeth IdeFleming did not think this was a flare-up of BOOP. Leukocytosis was thought either due to infection or steroid or SIRS. He suggested to check the IgE level and agreed with antibiotics, added Spiriva, DuoNeb, and theophylline. He suggested transfer to ICU if she deteriorated. The patient underwent insertion of a central venous line on the right side due to poor venous access requiring multiple medications. IJ line was put in from the right side. The patient, however, continued to wheeze for the next few days. She was kept on BiPAP to support the breathing. She was transferred to ICU on January 16th because of tachycardia, respiration rate of 35,  and use accessory muscles. She was also intubated, decided . It was thought she had COPD and asthma exacerbation with a bronchospastic component. She got tired after the bleeding and that is why she was intubated. Intubation helped her right away with decreased wheezing. Solu-Medrol 60 mg q.6 hours, Combivent, Flovent, and theophylline were continued. Morphine was used to calm her down. She was extubated on January 19th and transferred to floor care the next day. She has been later seen on January 22nd by the surgical consultant because of gallbladder disease. Hepatobiliary scan was obtained which showed normally functioning gallbladder. The patient did not have any symptoms of gallbladder disease so it was thought that because of the patient's critical illness and very poor pulmonary resolve that she should not undergo any kind of surgical treatment unless she becomes symptomatic. The patient was discharged on January 24th. Her condition was stable.   MEDICATIONS ON DISCHARGE:  1. Clonazepam 1 mg tablet 3 times a day.  2. Albuterol inhaler 2.5 mg/0.5 mg 3 mL inhalation solution inhaled 3 to 4 times a day.  3. Simvastatin 20 mg p.o. daily.  4. Amitriptyline 10 mg p.o. daily.  5. Aspirin 81 mg p.o. daily.  6. Celexa 40 mg p.o. daily.  7. Symbicort 160/4.5 inhalation 1 to 2 puffs twice a day.  OXYGEN: 2 liters per minute.   FINAL DIAGNOSES:  1. Left-sided pneumonia. 2. SIRS due to flare-up of asthma. 3. Bronchospasm. 4. Chronic obstructive pulmonary disease. 5. History of BOOP. 6. Status post intubation for ventilatory support and hypoxia.  7. Exogenous obesity.  8. Anxiety neurosis.  9. Anxiety with depression.  FOLLOW-UP:  1. She will be seen in my office in one week.  2. She will also follow-up with Dr. Meredeth Ide.   ____________________________ Corky Downs, MD jm:drc D: 10/27/2011 12:41:45 ET T: 10/27/2011 13:55:22 ET JOB#: [id number]  cc: Corky Downs, MD, <Dictator> Corky Downs  MD ELECTRONICALLY SIGNED 11/03/2011 18:37

## 2015-01-08 NOTE — H&P (Signed)
PATIENT NAME:  Caitlyn Evans, [name] MR#:  [id number] DATE OF BIRTH:  11/18/1965  DATE OF ADMISSION:  10/13/2011  REFERRING PHYSICIAN: Dr. Enedina FinnerGoli  FAMILY PHYSICIAN: Dr. Juel BurrowMasoud   REASON FOR ADMISSION: Failure to thrive with generalized weakness and fever.   HISTORY OF PRESENT ILLNESS: The patient is a 49 year old female with a history of chronic respiratory failure on oxygen, chronic obstructive pulmonary disease, and BOOP who was discharged approximately three days ago from the hospital. Represents now with failure to thrive and weakness. No other symptoms. She states she has had a mild fever at home. In the Emergency Room, the patient is weak and cannot care for herself. Her family is unable to care for her. She is now admitted for further evaluation.   PAST MEDICAL HISTORY:  1. Chronic respiratory failure on oxygen.  2. Chronic obstructive pulmonary disease.  3. Recent respiratory failure due to pneumonia requiring mechanical ventilation.  4. History of congestive heart failure.  5. Hyperlipidemia.  6. History of BOOP. 7. Chronic pain.  8. Anxiety/depression.  9. History of cholelithiasis.   MEDICATIONS ON ADMISSION:  1. Klonopin 1 mg p.o. t.i.d. p.r.n.  2. DuoNeb SVN q. 6 hours p.r.n.  3. Zocor 20 mg p.o. at bedtime.  4. Elavil 10 mg p.o. at bedtime.  5. Aspirin 81 mg p.o. daily.  6. Celexa 40 mg p.o. daily.  7. Spiriva 1 capsule inhaled daily.  8. Symbicort 2 puffs b.i.d.  9. Requip 0.25 mg p.o. b.i.d.  10. Oxygen at 2 liters per minute per nasal cannula.   ALLERGIES: Amoxicillin and codeine.   SOCIAL HISTORY: The patient quit smoking a year ago. No history of alcohol abuse.   FAMILY HISTORY: Positive for chronic obstructive pulmonary disease and heart disease.    REVIEW OF SYSTEMS: CONSTITUTIONAL:  She has had fever. No change in weight.  EYES: No blurred or double vision. No glaucoma. ENT: No tinnitus or hearing loss. No nasal discharge or bleeding. No difficulty swallowing.  RESPIRATORY: Some cough and wheezing. No hemoptysis. No painful respiration. CARDIOVASCULAR: No chest pain or orthopnea. No palpitations or syncope. GI: No nausea, vomiting, or diarrhea. No abdominal pain. No change in bowel habits. GU: No dysuria or hematuria. No incontinence. ENDOCRINE: No polyuria or polydipsia. No heat or cold intolerance. HEMATOLOGIC: The patient denies anemia, easy bruising, or bleeding. LYMPHATIC: No swollen glands. MUSCULOSKELETAL: The patient denies pain in her neck, back, shoulders, knees, or hips. No gout. NEUROLOGIC: No numbness although she has generalized weakness. Denies migraines. Denies stroke or seizures. PSYCH: The patient denies anxiety, insomnia, or depression.   PHYSICAL EXAMINATION:  GENERAL: The patient is chronically ill-appearing, in no acute distress.   VITAL SIGNS: Vital signs are remarkable for a blood pressure of 160/100 with a heart rate of 115 and a respiratory rate of 24. Temperature is 99.9.   HEENT: Normocephalic, atraumatic. Pupils equally round and reactive to light and accommodation. Extraocular movements are intact. Sclerae anicteric. Conjunctivae are clear.  Oropharynx is dry but clear.   NECK: Supple without jugular venous distention or bruits. No adenopathy or thyromegaly is noted.   LUNGS: Lungs revealed scattered rhonchi without wheezes or rales. Respiratory effort is normal.   CARDIAC: Rapid rate with a regular rhythm. Normal S1 and S2. No significant murmurs noted.   ABDOMEN: Soft, nontender, with normoactive bowel sounds. No organomegaly or masses were appreciated. No hernias or bruits were noted.   EXTREMITIES: Without clubbing, cyanosis, or edema. Pulses were 2+ bilaterally.   SKIN: Warm  and dry without rash or lesions.   NEUROLOGIC: Cranial nerves II through XII grossly intact. Deep tendon reflexes were symmetric. Motor and sensory examination is nonfocal.   PSYCH: Exam revealed a patient who was alert and oriented to person,  place, and time. She was cooperative and used good judgment.   LABORATORY, DIAGNOSTIC AND RADIOLOGICAL DATA: Abdominal ultrasound showed cholelithiasis, otherwise unremarkable. Chest x-ray revealed chronic basilar infiltrates with no acute changes. CBC was remarkable for a white count of 21.8 with a hemoglobin of 13.5. Glucose 116 with a BUN of 28 and a creatinine of 0.82 and a sodium of 135. Troponin was 0.06. Lipase was 91. Urinalysis was negative.   ASSESSMENT:  1. Fever of unknown origin.  2. Failure to thrive.  3. Generalized weakness.  4. Hyponatremia.  5. Chronic respiratory failure on oxygen.  6. Chronic obstructive pulmonary disease.   PLAN: The patient will be admitted to the floor as a FULL CODE on oxygen.  We will maximize her pulmonary regimen. We will send off blood and urine cultures and begin empiric IV antibiotics. Follow up chest x-ray and labs in the morning. We will consult physical therapy because of her weakness. We will consult the case manager for long-term placement. Further treatment and evaluation will depend upon the patient's progress.         TOTAL TIME SPENT ON ADMISSION: 50 minutes.    ____________________________ Duane Lope Judithann Sheen, MD jds:bjt D: 10/13/2011 21:37:17 ET T: 10/14/2011 07:08:30 ET JOB#: [id number]  cc: Duane Lope. Judithann Sheen, MD, <Dictator> Corky Downs, MD Krystel Fletchall Rodena Medin MD ELECTRONICALLY SIGNED 10/14/2011 15:40

## 2015-01-08 NOTE — Consult Note (Signed)
Asked to see patient for venous access.  Patient transferred from floor this evening with need for urgent intubation.  On multiple IV medicines with poor venous access.  Will place line at bedside.    Electronic Signatures: Annice Needyew, Ashawn Rinehart S (MD)  (Signed on 15-Jan-13 21:11)  Authored  Last Updated: 15-Jan-13 21:11 by Annice Needyew, Shaneen Reeser S (MD)

## 2015-01-08 NOTE — Op Note (Signed)
PATIENT NAME:  Caitlyn ScalesWORKMAN, Jacquie C MR#:  [id number] DATE OF BIRTH:  1965-12-22  DATE OF PROCEDURE:  10/01/2011  PREOPERATIVE DIAGNOSES:  1. Respiratory failure.  2. Poor venous access requiring multiple IV medicines.   POSTOPERATIVE DIAGNOSES:   1. Respiratory failure.  2. Poor venous access requiring multiple IV medicines.   PROCEDURES:  1. Ultrasound guidance for vascular access, right jugular vein.  2. Placement of right jugular triple lumen catheter.   SURGEON: Annice NeedyJason S. Shaniqua Guillot, M.D.   ANESTHESIA: Local.   ESTIMATED BLOOD LOSS: Minimal.   INDICATION FOR PROCEDURE: This is a 49 year old white female who has been in the hospital for several days, was emergently intubated today and transferred to the Critical Care Unit. She requires sedation and multiple other IV medications and a central line is necessary because she has very poor peripheral venous access.   DESCRIPTION OF PROCEDURE: Patient was laid flat in the critical care bed. Right neck was sterilely prepped and draped, sterile surgical field was created. The right jugular vein was visualized with ultrasound and found to be widely patent and it was then accessed under direct ultrasound guidance without difficulty with a Seldinger needle. A 3-J wire was placed after skin nick and dilatation. The triple lumen catheter was placed over the wire and the wire was removed. All three ports withdrew dark red nonpulsatile blood and flushed easily with sterile saline and was secured at 18 cm to the skin with three silk sutures. STAT chest x-ray is pending. ____________________________ Annice NeedyJason S. Miyo Aina, MD jsd:cms D: 10/01/2011 21:31:18 ET T: 10/02/2011 06:06:03 ET  JOB#: [id number] Marlow BaarsJASON S Quanasia Defino MD ELECTRONICALLY SIGNED 10/30/2011 11:14

## 2015-01-08 NOTE — H&P (Signed)
PATIENT NAME:  Caitlyn Evans, Keerthana C MR#:  [id number] DATE OF BIRTH:  04-Nov-1965  DATE OF ADMISSION:  09/29/2011  PRIMARY CARE PHYSICIAN:  Corky DownsJaved Masoud, MD  PULMONOLOGIST: Ned ClinesHerbon Fleming, MD  HISTORY OF PRESENT ILLNESS: Ms. Caitlyn Evans is a  49 year old female with past medical history of chronic respiratory failure with history of BOOP in the past who was recently admitted from 01/01 to 09/23/2011 and discharged with diagnosis of flare of her BOOP with antibiotics and prednisone taper who comes to the Emergency Room not feeling well, wheezing, and has a dry hacking cough. In the Emergency Room, chest x-ray shows significant infiltrates in the left lung. The patient's x-ray on 09/23/2011 showed lung fields clear, except for some bibasilar atelectasis. The patient is being admitted for further evaluation on her SIRS due to left-sided pneumonia/BOOP. The patient did receive a dose of IV meropenem in the Emergency Room. She completed a course of Levaquin. She is currently on a prednisone taper.   PAST MEDICAL HISTORY:  1. Hyperlipidemia.  2. Chronic obstructive pulmonary disease, chronic oxygen dependent and steroid dependent.  3. Congestive heart failure.  4. History of BOOP. 5. Chronic pain syndrome.  6. Anxiety and depression.  7. History of cholelithiasis diagnosed with ultrasound of the abdomen done 09/23/2011.   ALLERGIES: Amoxicillin and codeine.   SOCIAL HISTORY: She used to be a smoker and quit about a year ago. She does have a 15 to 20 pack-year smoking history. No alcohol use. No illicit drug use. She currently lives at home with her husband.   FAMILY HISTORY: Father is alive and has emphysema. He is on oxygen. Mother died from complications of chronic obstructive pulmonary disease and heart disease.    CURRENT MEDICATIONS:  1. Albuterol nebulizers.  2. Amitriptyline 10 mg at bedtime.  3. Aspirin 81 mg daily.  4. Citalopram 40 mg daily.  5. Clonazepam 1 mg three times daily as needed.   6. Remeron 15 mg at bedtime.  7. Oxycodone 5 mg four times daily. 8. Oxygen 2 liters daily, continuous.  9. Ropinirole 0.25 mg twice a day.  10. Simvastatin 20 mg at bedtime.  11. Spiriva HandiHaler daily.  12. Symbicort 160/4.5 mcg inhalation 2 puffs twice a day. 13. The patient currently is on a prednisone taper; 10 mg p.o. daily, indefinitely, on a chronic basis.  14. Just completed a course of Levaquin.   REVIEW OF SYSTEMS: CONSTITUTIONAL: Positive for fatigue and weakness. EYES: No blurred or double vision. ENT: No tinnitus, ear pain, or hearing loss. RESPIRATORY: Positive for cough, chronic obstructive pulmonary disease, and wheeze. CARDIOVASCULAR: No chest pain, orthopnea, or edema. GASTROINTESTINAL: No nausea, vomiting, diarrhea, or abdominal pain. GENITOURINARY: No dysuria or hematuria. ENDOCRINE: No polyuria or nocturia. HEMATOLOGY: No anemia or easy bruising. SKIN: No acne or rash. MUSCULOSKELETAL: Positive for arthritis. NEUROLOGIC: No cerebrovascular accident or transient ischemic attack PSYCH: No anxiety or depression. All other systems are reviewed and negative.   PHYSICAL EXAMINATION:   GENERAL: The patient is awake, alert, and oriented x3, not in acute distress.   VITAL SIGNS: Afebrile, pulse 104, respirations 32, blood pressure 117/62, and saturation 96% on 2 liters.   HEENT: Atraumatic, normocephalic. Pupils are equal, round, and reactive to light and accommodation. Extraocular movements intact. Oral mucosa is moist.   NECK: Supple. No JVD. No carotid bruit.   RESPIRATORY: The patient has coarse breath sounds with some wheezing, expiratory bilaterally. No rales heard. No use of accessory muscles.   CARDIOVASCULAR: Both the heart sounds  are normal. Tachycardia present. No murmur heard. PMI is not lateralized. Chest is nontender.   EXTREMITIES: Good pedal pulses. Good femoral pulses. No lower extremity edema.   ABDOMEN: Soft, benign, and nontender. No organomegaly.  Positive bowel sounds.   NEURO: Cranial nerves II through XII grossly intact. No motor or sensory deficits.   SKIN: Warm and dry.   LABS/STUDIES: Chest x-ray is concerning for infection in the left long, heterogenous opacities throughout the majority of the left lung.  White count 34.5, hemoglobin and hematocrit 9.2 and 29.4, platelet count 285, and MCV 79. Glucose 165, calcium 7.7, SGPT 88, SGOT 47, and albumin 2.5. Troponin is less than 0.02.   EKG shows sinus tachycardia.   ASSESSMENT AND PLAN: 49 year old Ms. Caitlyn Evans with:  1. Systemic inflammatory response syndrome due to left-sided pneumonia.  2. Acute on chronic hypoxic respiratory failure due to chronic obstructive pulmonary disease flare in the setting of pneumonia.  3. Leukocytosis.  4. History of BOOP with chronic respiratory infection.  5. Abdominal pain suspected from gallstones, diagnosed by ultrasound of the abdomen on 09/23/2011. 6. Chronic respiratory failure, on chronic home oxygen along with chronic steroids. 7. Restless leg syndrome.   PLAN:  1. Admit patient to telemetry floor.  2. FULL CODE. 3. Continue IV fluids.  4. We will start the patient on IV meropenem and continue that for now. The patient just completed a course of Levaquin. 5. We will resume back high-dose IV steroids, nebulizers, and oral inhalers.  6. Dr. Meredeth Ide to see the patient in consultation tomorrow.  7. Follow blood culture and sputum culture.  8. Consider possible bronchoscopy if the patient's x-ray and clinical symptoms do not improve. The patient has history of chronic BOOP. 9. SVNs as needed.  10. Heparin for deep vein thrombosis prophylaxis.   Further work-up according to the patient's clinical course. The hospital admission plan was discussed with the patient and family members who were agreeable to it.   CRITICAL TIME SPENT: 50 minutes. ____________________________ Wylie Hail Allena Katz, MD sap:slb D: 09/29/2011 13:19:42  ET T: 09/29/2011 13:39:50 ET JOB#: [id number]  cc: Kynslei Art A. Allena Katz, MD, <Dictator> Corky Downs, MD Herbon E. Meredeth Ide, MD Willow Ora MD ELECTRONICALLY SIGNED 10/01/2011 15:25

## 2015-01-11 DIAGNOSIS — I119 Hypertensive heart disease without heart failure: Secondary | ICD-10-CM | POA: Diagnosis not present

## 2015-01-11 DIAGNOSIS — R079 Chest pain, unspecified: Secondary | ICD-10-CM | POA: Diagnosis not present

## 2015-03-17 DIAGNOSIS — I119 Hypertensive heart disease without heart failure: Secondary | ICD-10-CM | POA: Diagnosis not present

## 2015-03-17 DIAGNOSIS — J439 Emphysema, unspecified: Secondary | ICD-10-CM | POA: Diagnosis not present

## 2015-03-17 DIAGNOSIS — I429 Cardiomyopathy, unspecified: Secondary | ICD-10-CM | POA: Diagnosis not present

## 2015-03-17 DIAGNOSIS — Z720 Tobacco use: Secondary | ICD-10-CM | POA: Diagnosis not present

## 2015-04-19 DIAGNOSIS — R1310 Dysphagia, unspecified: Secondary | ICD-10-CM | POA: Diagnosis not present

## 2015-04-19 DIAGNOSIS — I119 Hypertensive heart disease without heart failure: Secondary | ICD-10-CM | POA: Diagnosis not present

## 2015-04-19 DIAGNOSIS — Z720 Tobacco use: Secondary | ICD-10-CM | POA: Diagnosis not present

## 2015-04-19 DIAGNOSIS — J439 Emphysema, unspecified: Secondary | ICD-10-CM | POA: Diagnosis not present

## 2015-04-20 ENCOUNTER — Telehealth: Payer: Self-pay | Admitting: Gastroenterology

## 2015-04-20 NOTE — Telephone Encounter (Signed)
Left voice message for patient to call and schedule appointment for dysphagia with Dr. Servando Snare

## 2015-05-24 DIAGNOSIS — I429 Cardiomyopathy, unspecified: Secondary | ICD-10-CM | POA: Diagnosis not present

## 2015-05-24 DIAGNOSIS — F410 Panic disorder [episodic paroxysmal anxiety] without agoraphobia: Secondary | ICD-10-CM | POA: Diagnosis not present

## 2015-05-24 DIAGNOSIS — J439 Emphysema, unspecified: Secondary | ICD-10-CM | POA: Diagnosis not present

## 2015-05-24 DIAGNOSIS — I119 Hypertensive heart disease without heart failure: Secondary | ICD-10-CM | POA: Diagnosis not present

## 2015-05-25 DIAGNOSIS — Z23 Encounter for immunization: Secondary | ICD-10-CM | POA: Diagnosis not present

## 2015-05-30 ENCOUNTER — Other Ambulatory Visit: Payer: Self-pay

## 2015-05-30 DIAGNOSIS — I10 Essential (primary) hypertension: Secondary | ICD-10-CM | POA: Insufficient documentation

## 2015-05-30 DIAGNOSIS — I159 Secondary hypertension, unspecified: Secondary | ICD-10-CM

## 2015-05-30 DIAGNOSIS — R1314 Dysphagia, pharyngoesophageal phase: Secondary | ICD-10-CM

## 2015-05-30 DIAGNOSIS — J439 Emphysema, unspecified: Secondary | ICD-10-CM

## 2015-05-30 HISTORY — DX: Dysphagia, pharyngoesophageal phase: R13.14

## 2015-05-30 HISTORY — DX: Emphysema, unspecified: J43.9

## 2015-05-31 ENCOUNTER — Encounter: Payer: Self-pay | Admitting: *Deleted

## 2015-05-31 ENCOUNTER — Ambulatory Visit (INDEPENDENT_AMBULATORY_CARE_PROVIDER_SITE_OTHER): Payer: Medicare Other | Admitting: Gastroenterology

## 2015-05-31 ENCOUNTER — Other Ambulatory Visit: Payer: Self-pay

## 2015-05-31 ENCOUNTER — Encounter: Payer: Self-pay | Admitting: Gastroenterology

## 2015-05-31 ENCOUNTER — Encounter (INDEPENDENT_AMBULATORY_CARE_PROVIDER_SITE_OTHER): Payer: Self-pay

## 2015-05-31 VITALS — BP 129/71 | HR 95 | Temp 98.0°F | Ht 59.0 in | Wt 111.0 lb

## 2015-05-31 DIAGNOSIS — R1314 Dysphagia, pharyngoesophageal phase: Secondary | ICD-10-CM | POA: Diagnosis not present

## 2015-05-31 DIAGNOSIS — R634 Abnormal weight loss: Secondary | ICD-10-CM | POA: Diagnosis not present

## 2015-05-31 NOTE — Progress Notes (Signed)
Gastroenterology Consultation  Referring Provider:     Corky Downs, MD Primary Care Physician:  Corky Downs, MD Primary Gastroenterologist:  Dr. Servando Snare     Reason for Consultation:     Dysphagia and weight loss        HPI:   Caitlyn Evans is a 49 y.o. y/o female referred for consultation & management of dysphagia and weight loss by Dr. Corky Downs, MD.  This patient comes in today with a report of years of dysphagia that she states has gotten worse recently. There is no report of any black stools or bloody stools. The patient has had multiple medical problems and has been intubated in the past. The patient had this prior to 2014. In the patient's most recent history she reports that she has been having progressive dysphagia that has now caused the only small amounts of food and chewing them well and sometimes can't take solids at all. She denies ever having a colonoscopy in the past. She denies any abdominal pain nausea vomiting fevers or chills. There is also no report of any constipation or diarrhea.  Past Medical History  Diagnosis Date  . Migraines   . Hypercholesterolemia   . Anxiety   . Seasonal allergies   . Emphysema lung 05/30/2015  . Dysphagia, pharyngoesophageal phase 05/30/2015  . Myocardial infarction   . Lung mass     Past Surgical History  Procedure Laterality Date  . Vaginal hysterectomy    . Lung surgery Left     Upper lobe removed    Prior to Admission medications   Medication Sig Start Date End Date Taking? Authorizing Provider  ipratropium-albuterol (DUONEB) 0.5-2.5 (3) MG/3ML SOLN  09/21/10  Yes Historical Provider, MD    Family History  Problem Relation Age of Onset  . COPD Mother   . Heart attack Mother   . Stroke Father   . COPD Father      Social History  Substance Use Topics  . Smoking status: Former Games developer  . Smokeless tobacco: Never Used  . Alcohol Use: 0.0 oz/week    0 Standard drinks or equivalent per week     Comment: rare  consumption    Allergies as of 05/31/2015 - Review Complete 05/31/2015  Allergen Reaction Noted  . Penicillins Hives 05/30/2015  . Codeine Hives and Nausea Only 05/30/2015    Review of Systems:    All systems reviewed and negative except where noted in HPI.   Physical Exam:  BP 129/71 mmHg  Pulse 95  Temp(Src) 98 F (36.7 C) (Oral)  Ht 4\' 11"  (1.499 m)  Wt 111 lb (50.349 kg)  BMI 22.41 kg/m2 No LMP recorded. Psych:  Alert and cooperative. Normal mood and affect. General:   Alert,  Well-developed, well-nourished, pleasant and cooperative in NAD Head:  Normocephalic and atraumatic. Eyes:  Sclera clear, no icterus.   Conjunctiva pink. Ears:  Normal auditory acuity. Nose:  No deformity, discharge, or lesions. Mouth:  No deformity or lesions,oropharynx pink & moist. Neck:  Supple; no masses or thyromegaly. Lungs:  Respirations even and unlabored.  Clear throughout to auscultation.   No wheezes, crackles, or rhonchi. No acute distress. Heart:  Regular rate and rhythm; no murmurs, clicks, rubs, or gallops. Abdomen:  Normal bowel sounds.  No bruits.  Soft, non-tender and non-distended without masses, hepatosplenomegaly or hernias noted.  No guarding or rebound tenderness.  Negative Carnett sign.   Rectal:  Deferred.  Msk:  Symmetrical without gross deformities.  Good, equal movement &  strength bilaterally. Pulses:  Normal pulses noted. Extremities:  No clubbing or edema.  No cyanosis. Neurologic:  Alert and oriented x3;  grossly normal neurologically. Skin:  Intact without significant lesions or rashes.  No jaundice. Lymph Nodes:  No significant cervical adenopathy. Psych:  Alert and cooperative. Normal mood and affect.  Imaging Studies: No results found.  Assessment and Plan:   Caitlyn Evans is a 49 y.o. y/o female who comes in today with a history of weight loss and dysphagia. The patient has also not had a screening colonoscopy. The patient will be set up for an EGD and  colonoscopy due to her dysphagia and weight loss and colonoscopy for screening purposes that she has never had a colonoscopy. The patient has been explained the plan and agrees with it.I have discussed risks & benefits which include, but are not limited to, bleeding, infection, perforation & drug reaction.  The patient agrees with this plan & written consent will be obtained.      Note: This dictation was prepared with Dragon dictation along with smaller phrase technology. Any transcriptional errors that result from this process are unintentional.

## 2015-06-01 NOTE — Anesthesia Preprocedure Evaluation (Addendum)
Anesthesia Evaluation  Patient identified by MRN, date of birth, ID band Patient awake    Reviewed: Allergy & Precautions, H&P , NPO status , Patient's Chart, lab work & pertinent test results, reviewed documented beta blocker date and time   Airway Mallampati: II  TM Distance: >3 FB Neck ROM: full    Dental no notable dental hx.    Pulmonary shortness of breath and with exertion, COPD,  COPD inhaler, former smoker,    Pulmonary exam normal breath sounds clear to auscultation       Cardiovascular Exercise Tolerance: Good hypertension, + Past MI   Rhythm:regular Rate:Normal     Neuro/Psych  Headaches, negative psych ROS   GI/Hepatic negative GI ROS, Neg liver ROS,   Endo/Other  negative endocrine ROS  Renal/GU negative Renal ROS  negative genitourinary   Musculoskeletal   Abdominal   Peds  Hematology negative hematology ROS (+)   Anesthesia Other Findings   Reproductive/Obstetrics negative OB ROS                             Anesthesia Physical Anesthesia Plan  ASA: II  Anesthesia Plan: General   Post-op Pain Management:    Induction:   Airway Management Planned:   Additional Equipment:   Intra-op Plan:   Post-operative Plan:   Informed Consent: I have reviewed the patients History and Physical, chart, labs and discussed the procedure including the risks, benefits and alternatives for the proposed anesthesia with the patient or authorized representative who has indicated his/her understanding and acceptance.     Plan Discussed with: CRNA  Anesthesia Plan Comments:         Anesthesia Quick Evaluation

## 2015-06-02 ENCOUNTER — Ambulatory Visit: Payer: Medicare Other | Admitting: Anesthesiology

## 2015-06-02 ENCOUNTER — Encounter
Admission: RE | Disposition: A | Discharge status: Home / Self Care | Payer: Self-pay | Source: Ambulatory Visit | Attending: Gastroenterology

## 2015-06-02 ENCOUNTER — Ambulatory Visit
Admission: RE | Admit: 2015-06-02 | Discharge: 2015-06-02 | Disposition: A | Discharge status: Home / Self Care | Payer: Medicare Other | Source: Ambulatory Visit | Attending: Gastroenterology | Admitting: Gastroenterology

## 2015-06-02 ENCOUNTER — Other Ambulatory Visit: Payer: Self-pay | Admitting: Gastroenterology

## 2015-06-02 DIAGNOSIS — Z823 Family history of stroke: Secondary | ICD-10-CM | POA: Insufficient documentation

## 2015-06-02 DIAGNOSIS — I252 Old myocardial infarction: Secondary | ICD-10-CM | POA: Diagnosis not present

## 2015-06-02 DIAGNOSIS — Z87891 Personal history of nicotine dependence: Secondary | ICD-10-CM | POA: Diagnosis not present

## 2015-06-02 DIAGNOSIS — F419 Anxiety disorder, unspecified: Secondary | ICD-10-CM | POA: Insufficient documentation

## 2015-06-02 DIAGNOSIS — Z79899 Other long term (current) drug therapy: Secondary | ICD-10-CM | POA: Insufficient documentation

## 2015-06-02 DIAGNOSIS — K222 Esophageal obstruction: Secondary | ICD-10-CM | POA: Insufficient documentation

## 2015-06-02 DIAGNOSIS — K259 Gastric ulcer, unspecified as acute or chronic, without hemorrhage or perforation: Secondary | ICD-10-CM | POA: Insufficient documentation

## 2015-06-02 DIAGNOSIS — Z90710 Acquired absence of both cervix and uterus: Secondary | ICD-10-CM | POA: Insufficient documentation

## 2015-06-02 DIAGNOSIS — Z885 Allergy status to narcotic agent status: Secondary | ICD-10-CM | POA: Diagnosis not present

## 2015-06-02 DIAGNOSIS — K253 Acute gastric ulcer without hemorrhage or perforation: Secondary | ICD-10-CM

## 2015-06-02 DIAGNOSIS — K449 Diaphragmatic hernia without obstruction or gangrene: Secondary | ICD-10-CM | POA: Diagnosis not present

## 2015-06-02 DIAGNOSIS — E780 Pure hypercholesterolemia: Secondary | ICD-10-CM | POA: Insufficient documentation

## 2015-06-02 DIAGNOSIS — R634 Abnormal weight loss: Secondary | ICD-10-CM | POA: Diagnosis not present

## 2015-06-02 DIAGNOSIS — Z7982 Long term (current) use of aspirin: Secondary | ICD-10-CM | POA: Insufficient documentation

## 2015-06-02 DIAGNOSIS — R1310 Dysphagia, unspecified: Secondary | ICD-10-CM | POA: Diagnosis not present

## 2015-06-02 DIAGNOSIS — Z8249 Family history of ischemic heart disease and other diseases of the circulatory system: Secondary | ICD-10-CM | POA: Insufficient documentation

## 2015-06-02 DIAGNOSIS — Z880 Allergy status to penicillin: Secondary | ICD-10-CM | POA: Diagnosis not present

## 2015-06-02 DIAGNOSIS — K228 Other specified diseases of esophagus: Secondary | ICD-10-CM | POA: Insufficient documentation

## 2015-06-02 DIAGNOSIS — Z825 Family history of asthma and other chronic lower respiratory diseases: Secondary | ICD-10-CM | POA: Insufficient documentation

## 2015-06-02 DIAGNOSIS — Z1211 Encounter for screening for malignant neoplasm of colon: Secondary | ICD-10-CM | POA: Insufficient documentation

## 2015-06-02 DIAGNOSIS — K2210 Ulcer of esophagus without bleeding: Secondary | ICD-10-CM | POA: Insufficient documentation

## 2015-06-02 DIAGNOSIS — J439 Emphysema, unspecified: Secondary | ICD-10-CM | POA: Diagnosis not present

## 2015-06-02 DIAGNOSIS — K3189 Other diseases of stomach and duodenum: Secondary | ICD-10-CM | POA: Diagnosis not present

## 2015-06-02 DIAGNOSIS — K2289 Other specified disease of esophagus: Secondary | ICD-10-CM | POA: Insufficient documentation

## 2015-06-02 HISTORY — DX: Motion sickness, initial encounter: T75.3XXA

## 2015-06-02 HISTORY — PX: ESOPHAGOGASTRODUODENOSCOPY (EGD) WITH PROPOFOL: SHX5813

## 2015-06-02 HISTORY — PX: COLONOSCOPY WITH PROPOFOL: SHX5780

## 2015-06-02 HISTORY — DX: Reserved for inherently not codable concepts without codable children: IMO0001

## 2015-06-02 SURGERY — COLONOSCOPY WITH PROPOFOL
Anesthesia: General | Wound class: Contaminated

## 2015-06-02 MED ORDER — FENTANYL CITRATE (PF) 100 MCG/2ML IJ SOLN: 50.0000 ug | INTRAMUSCULAR | Status: DC | PRN

## 2015-06-02 MED ORDER — STERILE WATER FOR IRRIGATION IR SOLN
Status: DC | PRN
  Administered 2015-06-02: 10:00:00

## 2015-06-02 MED ORDER — LACTATED RINGERS IV SOLN
INTRAVENOUS | Status: DC
  Administered 2015-06-02 (×2): via INTRAVENOUS

## 2015-06-02 MED ORDER — MIDAZOLAM HCL 2 MG/2ML IJ SOLN: 2.0000 mg | INTRAMUSCULAR | Status: DC | PRN

## 2015-06-02 MED ORDER — GLYCOPYRROLATE 0.2 MG/ML IJ SOLN
INTRAMUSCULAR | Status: DC | PRN
  Administered 2015-06-02: 0.2 mg via INTRAVENOUS

## 2015-06-02 MED ORDER — LIDOCAINE HCL (CARDIAC) 20 MG/ML IV SOLN
INTRAVENOUS | Status: DC | PRN
  Administered 2015-06-02: 30 mg via INTRAVENOUS

## 2015-06-02 MED ORDER — LACTATED RINGERS IV SOLN: 500.0000 mL | INTRAVENOUS | Status: DC

## 2015-06-02 MED ORDER — PROPOFOL 10 MG/ML IV BOLUS
INTRAVENOUS | Status: DC | PRN
  Administered 2015-06-02: 20 mg via INTRAVENOUS
  Administered 2015-06-02 (×2): 25 mg via INTRAVENOUS
  Administered 2015-06-02: 50 mg via INTRAVENOUS
  Administered 2015-06-02: 25 mg via INTRAVENOUS
  Administered 2015-06-02: 50 mg via INTRAVENOUS
  Administered 2015-06-02 (×2): 25 mg via INTRAVENOUS
  Administered 2015-06-02: 100 mg via INTRAVENOUS

## 2015-06-02 SURGICAL SUPPLY — 39 items
BALLN DILATOR 10-12 8 (BALLOONS) ×2
BALLN DILATOR 12-15 8 (BALLOONS)
BALLN DILATOR 15-18 8 (BALLOONS)
BALLN DILATOR CRE 0-12 8 (BALLOONS) ×1
BALLN DILATOR ESOPH 8 10 CRE (MISCELLANEOUS) IMPLANT
BALLOON DILATOR 12-15 8 (BALLOONS) IMPLANT
BALLOON DILATOR 15-18 8 (BALLOONS) IMPLANT
BALLOON DILATOR CRE 0-12 8 (BALLOONS) ×1 IMPLANT
BLOCK BITE 60FR ADLT L/F GRN (MISCELLANEOUS) ×3 IMPLANT
CANISTER SUCT 1200ML W/VALVE (MISCELLANEOUS) ×3 IMPLANT
FCP ESCP3.2XJMB 240X2.8X (MISCELLANEOUS)
FORCEPS BIOP RAD 4 LRG CAP 4 (CUTTING FORCEPS) ×3 IMPLANT
FORCEPS BIOP RJ4 240 W/NDL (MISCELLANEOUS)
FORCEPS ESCP3.2XJMB 240X2.8X (MISCELLANEOUS) IMPLANT
GOWN CVR UNV OPN BCK APRN NK (MISCELLANEOUS) ×2 IMPLANT
GOWN ISOL THUMB LOOP REG UNIV (MISCELLANEOUS) ×4
HEMOCLIP INSTINCT (CLIP) IMPLANT
INJECTOR VARIJECT VIN23 (MISCELLANEOUS) IMPLANT
KIT CO2 TUBING (TUBING) IMPLANT
KIT DEFENDO VALVE AND CONN (KITS) IMPLANT
KIT ENDO PROCEDURE OLY (KITS) ×3 IMPLANT
LIGATOR MULTIBAND 6SHOOTER MBL (MISCELLANEOUS) IMPLANT
MARKER SPOT ENDO TATTOO 5ML (MISCELLANEOUS) IMPLANT
PAD GROUND ADULT SPLIT (MISCELLANEOUS) IMPLANT
SNARE SHORT THROW 13M SML OVAL (MISCELLANEOUS) IMPLANT
SNARE SHORT THROW 30M LRG OVAL (MISCELLANEOUS) IMPLANT
SPOT EX ENDOSCOPIC TATTOO (MISCELLANEOUS)
SUCTION POLY TRAP 4CHAMBER (MISCELLANEOUS) IMPLANT
SYR INFLATION 60ML (SYRINGE) ×3 IMPLANT
TRAP SUCTION POLY (MISCELLANEOUS) IMPLANT
TUBING CONN 6MMX3.1M (TUBING)
TUBING SUCTION CONN 0.25 STRL (TUBING) IMPLANT
UNDERPAD 30X60 958B10 (PK) (MISCELLANEOUS) IMPLANT
VALVE BIOPSY ENDO (VALVE) IMPLANT
VARIJECT INJECTOR VIN23 (MISCELLANEOUS)
WATER AUXILLARY (MISCELLANEOUS) IMPLANT
WATER STERILE IRR 250ML POUR (IV SOLUTION) ×3 IMPLANT
WATER STERILE IRR 500ML POUR (IV SOLUTION) IMPLANT
WIRE CRE 18-20MM 8CM F G (MISCELLANEOUS) IMPLANT

## 2015-06-02 NOTE — Op Note (Signed)
[facility] Patient Name: Caitlyn Evans Procedure Date: 06/02/2015 10:06 AM MRN: [id number] Account #: [id number] Date of Birth: Jul 24, 1966 Admit Type: Outpatient Age: 49 Room: [facility] OR ROOM 01 Gender: Female Note Status: Finalized Procedure:         Upper GI endoscopy Indications:       Dysphagia, Weight loss Providers:         Midge Minium, MD Referring MD:      Corky Downs, MD (Referring MD) Medicines:         Propofol per Anesthesia Complications:     No immediate complications. Procedure:         Pre-Anesthesia Assessment:                    - Prior to the procedure, a History and Physical was                     performed, and patient medications and allergies were                     reviewed. The patient's tolerance of previous anesthesia                     was also reviewed. The risks and benefits of the procedure                     and the sedation options and risks were discussed with the                     patient. All questions were answered, and informed consent                     was obtained. Prior Anticoagulants: The patient has taken                     no previous anticoagulant or antiplatelet agents. ASA                     Grade Assessment: II - A patient with mild systemic                     disease. After reviewing the risks and benefits, the                     patient was deemed in satisfactory condition to undergo                     the procedure.                    After obtaining informed consent, the endoscope was passed                     under direct vision. Throughout the procedure, the                     patient's blood pressure, pulse, and oxygen saturations                     were monitored continuously. The was introduced through                     the mouth, and advanced to the second part of duodenum.  The upper GI endoscopy was accomplished without                     difficulty.  The patient tolerated the procedure well. Findings:      A benign-appearing, intrinsic mild stenosis was found at the       gastroesophageal junction and was traversed. A TTS dilator was passed       through the scope. Dilation with a 15-16.5-18 mm balloon (to a maximum       balloon size of 18 mm) dilator was performed.      Few cratered esophageal ulcers with no bleeding and no stigmata of       recent bleeding were found in the lower third of the esophagus. Biopsies       were taken with a cold forceps for histology.      A small hiatus hernia was present.      One non-bleeding cratered gastric ulcer with no stigmata of bleeding was       found in the gastric antrum. Biopsies were taken with a cold forceps for       histology.      The examined duodenum was normal. Impression:        - Benign-appearing esophageal stricture. Dilated.                    - Non-bleeding esophageal ulcers. Biopsied.                    - Small hiatus hernia.                    - Gastric ulcer with clean base. Biopsied.                    - Normal examined duodenum. Recommendation:    - Await pathology results.                    - Perform a colonoscopy today. Procedure Code(s): --- Professional ---                    [phone], Esophagogastroduodenoscopy, flexible, transoral;                     with biopsy, single or multiple Diagnosis Code(s): --- Professional ---                    R13.10, Dysphagia, unspecified                    R63.4, Abnormal weight loss                    K25.9, Gastric ulcer, unspecified as acute or chronic,                     without hemorrhage or perforation                    K22.10, Ulcer of esophagus without bleeding CPT copyright 2014 American Medical Association. All rights reserved. The codes documented in this report are preliminary and upon coder review may  be revised to meet current compliance requirements. Midge Minium, MD 06/02/2015 10:25:05 AM This report has been  signed electronically. Number of Addenda: 0 Note Initiated On: 06/02/2015 10:06 AM Total Procedure Duration: 0 hours 6 minutes 33 seconds       [facility]  Center

## 2015-06-02 NOTE — Transfer of Care (Signed)
Immediate Anesthesia Transfer of Care Note  Patient: Caitlyn Evans  Procedure(s) Performed: Procedure(s): COLONOSCOPY WITH PROPOFOL (N/A) ESOPHAGOGASTRODUODENOSCOPY (EGD) WITH PROPOFOL withdialation (N/A)  Patient Location: PACU  Anesthesia Type: General  Level of Consciousness: awake, alert  and patient cooperative  Airway and Oxygen Therapy: Patient Spontanous Breathing and Patient connected to supplemental oxygen  Post-op Assessment: Post-op Vital signs reviewed, Patient's Cardiovascular Status Stable, Respiratory Function Stable, Patent Airway and No signs of Nausea or vomiting  Post-op Vital Signs: Reviewed and stable  Complications: No apparent anesthesia complications

## 2015-06-02 NOTE — Anesthesia Procedure Notes (Signed)
Procedure Name: MAC Performed by: Micahel Omlor Pre-anesthesia Checklist: Patient identified, Emergency Drugs available, Suction available, Patient being monitored and Timeout performed Patient Re-evaluated:Patient Re-evaluated prior to inductionOxygen Delivery Method: Nasal cannula       

## 2015-06-02 NOTE — Anesthesia Postprocedure Evaluation (Signed)
  Anesthesia Post-op Note  Patient: Caitlyn Evans  Procedure(s) Performed: Procedure(s): COLONOSCOPY WITH PROPOFOL (N/A) ESOPHAGOGASTRODUODENOSCOPY (EGD) WITH PROPOFOL withdialation (N/A)  Anesthesia type:General  Patient location: PACU  Post pain: Pain level controlled  Post assessment: Post-op Vital signs reviewed, Patient's Cardiovascular Status Stable, Respiratory Function Stable, Patent Airway and No signs of Nausea or vomiting  Post vital signs: Reviewed and stable  Last Vitals:  Filed Vitals:   06/02/15 1041  BP: 98/68  Pulse: 77  Temp: 35.8 C  Resp: 18    Level of consciousness: awake, alert  and patient cooperative  Complications: No apparent anesthesia complications

## 2015-06-02 NOTE — H&P (Signed)
[facility]  [address], [address] [place], [place] [address] Phone: [phone] Fax : [phone]  Primary Care Physician:  Corky Downs, MD Primary Gastroenterologist:  Dr. Servando Snare  Pre-Procedure History & Physical: HPI:  Caitlyn Evans is a 49 y.o. female is here for an endoscopy and colonoscopy.   Past Medical History  Diagnosis Date  . Migraines   . Hypercholesterolemia   . Seasonal allergies   . Emphysema lung 05/30/2015  . Dysphagia, pharyngoesophageal phase 05/30/2015  . Lung mass   . Shortness of breath dyspnea     1 flight-stairs  . Anxiety     panic attacks  . Motion sickness     all moving vehicles  . Myocardial infarction 2012    Past Surgical History  Procedure Laterality Date  . Vaginal hysterectomy    . Lung surgery Left     Upper lobe removed  . Oophorectomy Left     Prior to Admission medications   Medication Sig Start Date End Date Taking? Authorizing Provider  ALPRAZolam Prudy Feeler) 0.5 MG tablet Take 0.5 mg by mouth 2 (two) times daily.   Yes Historical Provider, MD  Aspirin-Salicylamide-Caffeine (BC HEADACHE POWDER PO) Take by mouth as needed.   Yes Historical Provider, MD  citalopram (CELEXA) 40 MG tablet Take 40 mg by mouth daily. AM   Yes Historical Provider, MD  Ibuprofen-Diphenhydramine Cit (ADVIL PM PO) Take by mouth.   Yes Historical Provider, MD  ipratropium-albuterol (DUONEB) 0.5-2.5 (3) MG/3ML SOLN  09/21/10  Yes Historical Provider, MD    Allergies as of 05/31/2015 - Review Complete 05/31/2015  Allergen Reaction Noted  . Penicillins Hives 05/30/2015  . Codeine Hives and Nausea Only 05/30/2015    Family History  Problem Relation Age of Onset  . COPD Mother   . Heart attack Mother   . Stroke Father   . COPD Father     Social History   Social History  . Marital Status: Married    Spouse Name: N/A  . Number of Children: N/A  . Years of Education: N/A   Occupational History  . Not on file.   Social History Main  Topics  . Smoking status: Former Smoker -- 1.00 packs/day for 30 years  . Smokeless tobacco: Never Used     Comment: quit some time in 2012  . Alcohol Use: No     Comment: rare consumption  . Drug Use: No  . Sexual Activity: Not on file   Other Topics Concern  . Not on file   Social History Narrative    Review of Systems: See HPI, otherwise negative ROS  Physical Exam: BP 134/89 mmHg  Pulse 75  Temp(Src) 97.5 F (36.4 C) (Temporal)  Resp 16  Ht 5\' 1"  (1.549 m)  Wt 109 lb (49.442 kg)  BMI 20.61 kg/m2  SpO2 99% General:   Alert,  pleasant and cooperative in NAD Head:  Normocephalic and atraumatic. Neck:  Supple; no masses or thyromegaly. Lungs:  Clear throughout to auscultation.    Heart:  Regular rate and rhythm. Abdomen:  Soft, nontender and nondistended. Normal bowel sounds, without guarding, and without rebound.   Neurologic:  Alert and  oriented x4;  grossly normal neurologically.  Impression/Plan: Caitlyn Evans is here for an endoscopy and colonoscopy to be performed for dysphagia and screening colonoscopy.  Risks, benefits, limitations, and alternatives regarding  endoscopy and colonoscopy have been reviewed with the patient.  Questions have been answered.  All parties agreeable.   Darlina Rumpf,  MD  06/02/2015, 9:37 AM

## 2015-06-02 NOTE — Op Note (Signed)
[facility] Patient Name: Caitlyn Evans Procedure Date: 06/02/2015 10:06 AM MRN: [id number] Account #: [id number] Date of Birth: Jun 04, 1966 Admit Type: Outpatient Age: 49 Room: [facility] OR ROOM 01 Gender: Female Note Status: Finalized Procedure:         Colonoscopy Indications:       Screening for colorectal malignant neoplasm Providers:         Midge Minium, MD Referring MD:      Corky Downs, MD (Referring MD) Medicines:         Propofol per Anesthesia Complications:     No immediate complications. Procedure:         Pre-Anesthesia Assessment:                    - Prior to the procedure, a History and Physical was                     performed, and patient medications and allergies were                     reviewed. The patient's tolerance of previous anesthesia                     was also reviewed. The risks and benefits of the procedure                     and the sedation options and risks were discussed with the                     patient. All questions were answered, and informed consent                     was obtained. Prior Anticoagulants: The patient has taken                     no previous anticoagulant or antiplatelet agents. ASA                     Grade Assessment: II - A patient with mild systemic                     disease. After reviewing the risks and benefits, the                     patient was deemed in satisfactory condition to undergo                     the procedure.                    After obtaining informed consent, the colonoscope was                     passed under direct vision. Throughout the procedure, the                     patient's blood pressure, pulse, and oxygen saturations                     were monitored continuously. The Olympus CF-HQ190L                     Colonoscope (S#. [id number]) was introduced through the anus  and advanced to the the cecum, identified by appendiceal            orifice and ileocecal valve. The colonoscopy was performed                     without difficulty. The patient tolerated the procedure                     well. The quality of the bowel preparation was excellent. Findings:      The perianal and digital rectal examinations were normal.      The colon (entire examined portion) appeared normal. Impression:        - The entire examined colon is normal.                    - No specimens collected. Recommendation:    - Repeat colonoscopy in 10 years for screening unless any                     change in family history or lower GI problems. Procedure Code(s): --- Professional ---                    [phone], Colonoscopy, flexible; diagnostic, including                     collection of specimen(s) by brushing or washing, when                     performed (separate procedure) Diagnosis Code(s): --- Professional ---                    Z12.11, Encounter for screening for malignant neoplasm of                     colon CPT copyright 2014 [employer]. All rights reserved. The codes documented in this report are preliminary and upon coder review may  be revised to meet current compliance requirements. Midge Minium, MD 06/02/2015 10:39:00 AM This report has been signed electronically. Number of Addenda: 0 Note Initiated On: 06/02/2015 10:06 AM Scope Withdrawal Time: 0 hours 7 minutes 4 seconds  Total Procedure Duration: 0 hours 10 minutes 10 seconds       [facility]

## 2015-06-05 ENCOUNTER — Encounter: Payer: Self-pay | Admitting: Gastroenterology

## 2015-06-06 DIAGNOSIS — J439 Emphysema, unspecified: Secondary | ICD-10-CM | POA: Diagnosis not present

## 2015-06-06 DIAGNOSIS — R51 Headache: Secondary | ICD-10-CM | POA: Diagnosis not present

## 2015-06-06 DIAGNOSIS — K273 Acute peptic ulcer, site unspecified, without hemorrhage or perforation: Secondary | ICD-10-CM | POA: Diagnosis not present

## 2015-06-06 DIAGNOSIS — J449 Chronic obstructive pulmonary disease, unspecified: Secondary | ICD-10-CM | POA: Diagnosis not present

## 2015-06-06 NOTE — Telephone Encounter (Signed)
Scheduled for 06/01/15 at [facility]

## 2015-06-08 ENCOUNTER — Encounter: Payer: Self-pay | Admitting: Gastroenterology

## 2015-06-20 DIAGNOSIS — K222 Esophageal obstruction: Secondary | ICD-10-CM | POA: Diagnosis not present

## 2015-07-14 ENCOUNTER — Encounter: Payer: Self-pay | Admitting: Emergency Medicine

## 2015-07-14 ENCOUNTER — Emergency Department
Admission: EM | Admit: 2015-07-14 | Discharge: 2015-07-14 | Disposition: A | Discharge status: Home / Self Care | Payer: Medicare Other | Attending: Emergency Medicine | Admitting: Emergency Medicine

## 2015-07-14 DIAGNOSIS — F17200 Nicotine dependence, unspecified, uncomplicated: Secondary | ICD-10-CM | POA: Diagnosis not present

## 2015-07-14 DIAGNOSIS — R45851 Suicidal ideations: Secondary | ICD-10-CM | POA: Diagnosis not present

## 2015-07-14 DIAGNOSIS — F321 Major depressive disorder, single episode, moderate: Secondary | ICD-10-CM | POA: Diagnosis not present

## 2015-07-14 DIAGNOSIS — F329 Major depressive disorder, single episode, unspecified: Secondary | ICD-10-CM | POA: Diagnosis not present

## 2015-07-14 DIAGNOSIS — Z880 Allergy status to penicillin: Secondary | ICD-10-CM | POA: Insufficient documentation

## 2015-07-14 DIAGNOSIS — Z7982 Long term (current) use of aspirin: Secondary | ICD-10-CM | POA: Diagnosis not present

## 2015-07-14 DIAGNOSIS — F32A Depression, unspecified: Secondary | ICD-10-CM

## 2015-07-14 DIAGNOSIS — I10 Essential (primary) hypertension: Secondary | ICD-10-CM | POA: Insufficient documentation

## 2015-07-14 DIAGNOSIS — J441 Chronic obstructive pulmonary disease with (acute) exacerbation: Secondary | ICD-10-CM | POA: Diagnosis not present

## 2015-07-14 DIAGNOSIS — Z87890 Personal history of sex reassignment: Secondary | ICD-10-CM | POA: Diagnosis not present

## 2015-07-14 DIAGNOSIS — Z791 Long term (current) use of non-steroidal anti-inflammatories (NSAID): Secondary | ICD-10-CM | POA: Insufficient documentation

## 2015-07-14 DIAGNOSIS — F4322 Adjustment disorder with anxiety: Secondary | ICD-10-CM

## 2015-07-14 DIAGNOSIS — Z79899 Other long term (current) drug therapy: Secondary | ICD-10-CM | POA: Diagnosis not present

## 2015-07-14 LAB — URINALYSIS COMPLETE WITH MICROSCOPIC (ARMC ONLY)
BILIRUBIN URINE: NEGATIVE
Bacteria, UA: NONE SEEN
Glucose, UA: NEGATIVE mg/dL
LEUKOCYTES UA: NEGATIVE
Nitrite: NEGATIVE
PH: 5 (ref 5.0–8.0)
Protein, ur: NEGATIVE mg/dL
Specific Gravity, Urine: 1.023 (ref 1.005–1.030)
WBC UA: NONE SEEN WBC/hpf (ref 0–5)

## 2015-07-14 LAB — URINE DRUG SCREEN, QUALITATIVE (ARMC ONLY)
AMPHETAMINES, UR SCREEN: NOT DETECTED
Barbiturates, Ur Screen: NOT DETECTED
Benzodiazepine, Ur Scrn: NOT DETECTED
COCAINE METABOLITE, UR ~~LOC~~: NOT DETECTED
Cannabinoid 50 Ng, Ur ~~LOC~~: POSITIVE — AB
MDMA (ECSTASY) UR SCREEN: NOT DETECTED
Methadone Scn, Ur: NOT DETECTED
Opiate, Ur Screen: NOT DETECTED
PHENCYCLIDINE (PCP) UR S: NOT DETECTED
TRICYCLIC, UR SCREEN: NOT DETECTED

## 2015-07-14 LAB — COMPREHENSIVE METABOLIC PANEL
ALBUMIN: 3.9 g/dL (ref 3.5–5.0)
ALK PHOS: 113 U/L (ref 38–126)
ALT: 12 U/L — ABNORMAL LOW (ref 14–54)
ANION GAP: 9 (ref 5–15)
AST: 18 U/L (ref 15–41)
BUN: 13 mg/dL (ref 6–20)
CALCIUM: 9 mg/dL (ref 8.9–10.3)
CO2: 24 mmol/L (ref 22–32)
Chloride: 109 mmol/L (ref 101–111)
Creatinine, Ser: 0.59 mg/dL (ref 0.44–1.00)
GFR calc non Af Amer: >60 mL/min (ref >60)
GLUCOSE: 90 mg/dL (ref 65–99)
POTASSIUM: 3.8 mmol/L (ref 3.5–5.1)
SODIUM: 142 mmol/L (ref 135–145)
Total Bilirubin: 0.4 mg/dL (ref 0.3–1.2)
Total Protein: 6.9 g/dL (ref 6.5–8.1)

## 2015-07-14 LAB — CBC
HCT: 42.3 % (ref 35.0–47.0)
HEMOGLOBIN: 13.9 g/dL (ref 12.0–16.0)
MCH: 29.2 pg (ref 26.0–34.0)
MCHC: 32.8 g/dL (ref 32.0–36.0)
MCV: 88.9 fL (ref 80.0–100.0)
PLATELETS: 274 10*3/uL (ref 150–440)
RBC: 4.75 MIL/uL (ref 3.80–5.20)
RDW: 14.6 % — ABNORMAL HIGH (ref 11.5–14.5)
WBC: 7.2 10*3/uL (ref 3.6–11.0)

## 2015-07-14 LAB — ACETAMINOPHEN LEVEL

## 2015-07-14 LAB — SALICYLATE LEVEL
SALICYLATE LVL: 25.8 mg/dL (ref 2.8–30.0)
Salicylate Lvl: 30.9 mg/dL (ref 2.8–30.0)

## 2015-07-14 LAB — ETHANOL: Alcohol, Ethyl (B): <5 mg/dL (ref <5)

## 2015-07-14 MED ORDER — DOXYCYCLINE HYCLATE 100 MG PO TABS
100.0000 mg | ORAL_TABLET | Freq: Two times a day (BID) | ORAL | Status: DC
Start: 2015-07-14 — End: 2015-07-14
  Administered 2015-07-14: 100 mg via ORAL
  Filled 2015-07-14: qty 1

## 2015-07-14 MED ORDER — DOXYCYCLINE HYCLATE 100 MG PO TABS: 100.0000 mg | ORAL_TABLET | Freq: Two times a day (BID) | ORAL | Status: DC

## 2015-07-14 MED ORDER — IPRATROPIUM-ALBUTEROL 0.5-2.5 (3) MG/3ML IN SOLN
6.0000 mL | Freq: Once | RESPIRATORY_TRACT | Status: AC
Start: 2015-07-14 — End: 2015-07-14
  Administered 2015-07-14: 6 mL via RESPIRATORY_TRACT

## 2015-07-14 MED ORDER — PREDNISONE 20 MG PO TABS
60.0000 mg | ORAL_TABLET | Freq: Once | ORAL | Status: AC
  Administered 2015-07-14: 60 mg via ORAL
  Filled 2015-07-14: qty 3

## 2015-07-14 MED ORDER — IPRATROPIUM-ALBUTEROL 0.5-2.5 (3) MG/3ML IN SOLN
RESPIRATORY_TRACT | Status: AC
  Administered 2015-07-14: 6 mL via RESPIRATORY_TRACT
  Filled 2015-07-14: qty 3

## 2015-07-14 MED ORDER — PREDNISONE 20 MG PO TABS: 60.0000 mg | ORAL_TABLET | Freq: Every day | ORAL | Status: DC

## 2015-07-14 NOTE — ED Notes (Signed)
BEHAVIORAL HEALTH ROUNDING Patient sleeping: No. Patient alert and oriented: yes Behavior appropriate: Yes.  ; If no, describe:  Nutrition and fluids offered: yes Toileting and hygiene offered: Yes  Sitter present: q15 minute observations and security camera monitoring Law enforcement present: Yes  ODS  

## 2015-07-14 NOTE — Consult Note (Signed)
[facility] Psychiatry Consult   Reason for Consult:  Consult for this 49 year old woman who was Center under involuntary commitment from [facility] because of concern about suicidality Referring Physician:  [place] Patient Identification: Caitlyn Evans MRN:  [id number] Principal Diagnosis: Depression, major, single episode, moderate ([place]) Diagnosis:   Patient Active Problem List   Diagnosis Date Noted  . Depression, major, single episode, moderate ([place]) [F32.1] 07/14/2015  . Adjustment disorder with anxiety [F43.22] 07/14/2015  . COPD (chronic obstructive pulmonary disease) ([place]) [J44.9] 07/14/2015  . Special screening for malignant neoplasms, colon [Z12.11]   . Swallowing difficulty [R13.10]   . Loss of weight [R63.4]   . Esophagogastric ulcer [K25.9]   . Esophageal ulcer [K22.10]   . Hypertension [I10] 05/30/2015  . Emphysema lung ([place]) [J43.9] 05/30/2015  . Dysphagia, pharyngoesophageal phase [R13.14] 05/30/2015    Total Time spent with patient: 1 hour  Subjective:   Caitlyn Evans is a 49 y.o. female patient admitted with "I thought that I could just talk to someone and look what they did".  HPI:  This 49 year old woman was sent here from [facility] on involuntary commitment. Patient interviewed. Chart reviewed including old notes in the chart. All the paperwork from [facility] was reviewed. Labs reviewed. Patient went [facility] this morning for an intake appointment at the urging of other people in her life including a therapist who sees her grandson. She says that she was hoping to just talk to someone about her anxiety. Evidently they were concerned enough about possible suicidality they felt she needed to be committed. Patient admits that she told them she had had thoughts at times about killing herself but she claims that she never said she was actually planning on killing herself. She says multiple times that if she wanted to kill her self she would've already done it but that in fact she has no  desire to die at all. She does say that she feels depressed down and angry much of the time. She feels very anxious much of the time. She says she is under an overwhelming amount of stress at home because she has to take care of several generations of people in the house. Her sleep is poor and her appetite is poor. She denies that she is having any auditory or visual hallucinations. She denies that she has been drinking. Says that she has been taking her Xanax prescribed by her primary care doctor but denies abusing any other drugs. She has chronic medical problems with COPD and recently had to have her esophagus stretched possibly related to her prior ventilations but also to a history of esophageal ulcers.  Social history: Patient has her daughter and several grandchildren living at home as well as her elderly father and her husband. Patient says she does all of the housework and all of the cooking. She gets angry particularly at her daughter.  Medical history: Multiple medical problems including long-standing COPD, 3 hospitalizations with ventilation related to her COPD, history of esophageal ulcers, history of a myocardial infarction. Despite this she says the only medicine she is currently taking his Xanax Ambien tramadol and albuterol.  Substance abuse history: Patient says that she does not drink and has had alcohol maybe only one or 2 times every year. She uses marijuana but infrequently. Denies feeling like it's a problem.    Past Psychiatric History: Patient admits that she had one psychiatric hospitalization from an overdose of pills many years ago but has never tried to kill her self  since then. She has been on Zoloft in the past and said that it made her so overwhelmingly nervous and shaky she had to stop it. She is not currently getting any psychiatric treatment except for medicine she gets from her primary care doctor. No history of psychosis.  Risk to Self: Suicidal Ideation: No Suicidal  Intent: No Is patient at risk for suicide?: No Suicidal Plan?: No Access to Means: No What has been your use of drugs/alcohol within the last 12 months?: None Reported How many times?: 1 Other Self Harm Risks: None Reported Triggers for Past Attempts: None known Intentional Self Injurious Behavior: None Risk to Others: Homicidal Ideation: No Thoughts of Harm to Others: No Current Homicidal Intent: No Current Homicidal Plan: No Access to Homicidal Means: No Identified Victim: None Reported History of harm to others?: No Assessment of Violence: None Noted Violent Behavior Description: None Reported Does patient have access to weapons?: No Criminal Charges Pending?: No Does patient have a court date: No Prior Inpatient Therapy: Prior Inpatient Therapy: No Prior Therapy Dates: n/a Prior Therapy Facilty/Provider(s): n/a Reason for Treatment: n/a Prior Outpatient Therapy: Prior Outpatient Therapy: No Prior Therapy Dates: n/a Prior Therapy Facilty/Provider(s): n/a Reason for Treatment: n/a Does patient have an ACCT team?: No Does patient have Intensive In-House Services?  : No Does patient have [facility] services? : No Does patient have P4CC services?: No  Past Medical History:  Past Medical History  Diagnosis Date  . Migraines   . Hypercholesterolemia   . Seasonal allergies   . Emphysema lung ([place]) 05/30/2015  . Dysphagia, pharyngoesophageal phase 05/30/2015  . Lung mass   . Shortness of breath dyspnea     1 flight-stairs  . Anxiety     panic attacks  . Motion sickness     all moving vehicles  . Myocardial infarction [facility]) 2012    Past Surgical History  Procedure Laterality Date  . Vaginal hysterectomy    . Lung surgery Left     Upper lobe removed  . Oophorectomy Left   . Colonoscopy with propofol N/A 06/02/2015    Procedure: COLONOSCOPY WITH PROPOFOL;  Surgeon: Lucilla Lame, MD;  Location: [place];  Service: Endoscopy;  Laterality: N/A;  .  Esophagogastroduodenoscopy (egd) with propofol N/A 06/02/2015    Procedure: ESOPHAGOGASTRODUODENOSCOPY (EGD) WITH PROPOFOL withdialation;  Surgeon: Lucilla Lame, MD;  Location: [place];  Service: Endoscopy;  Laterality: N/A;   Family History:  Family History  Problem Relation Age of Onset  . COPD Mother   . Heart attack Mother   . Stroke Father   . COPD Father    Family Psychiatric  History: Patient states that several people in her family had substance abuse problems and that her father in particular has an alcohol dependence problem. No known family history of suicide attempts. Social History:  History  Alcohol Use No    Comment: rare consumption     History  Drug Use No    Social History   Social History  . Marital Status: Married    Spouse Name: N/A  . Number of Children: N/A  . Years of Education: N/A   Social History Main Topics  . Smoking status: Former Smoker -- 1.00 packs/day for 30 years  . Smokeless tobacco: Never Used     Comment: quit some time in 2012  . Alcohol Use: No     Comment: rare consumption  . Drug Use: No  . Sexual Activity: Not Asked   Other Topics  Concern  . None   Social History Narrative   Additional Social History:    Pain Medications: See PTA Prescriptions: See PTA Over the Counter: See PTA History of alcohol / drug use?: No history of alcohol / drug abuse                     Allergies:   Allergies  Allergen Reactions  . Penicillins Hives  . Codeine Hives and Nausea Only    Labs:  Results for orders placed or performed during the hospital encounter of 07/14/15 (from the past 48 hour(s))  Urine Drug Screen, Qualitative ([facility] only)     Status: Abnormal   Collection Time: 07/14/15  2:20 PM  Result Value Ref Range   Tricyclic, Ur Screen NONE DETECTED NONE DETECTED   Amphetamines, Ur Screen NONE DETECTED NONE DETECTED   MDMA (Ecstasy)Ur Screen NONE DETECTED NONE DETECTED   Cocaine Metabolite,Ur [place] NONE  DETECTED NONE DETECTED   Opiate, Ur Screen NONE DETECTED NONE DETECTED   Phencyclidine (PCP) Ur S NONE DETECTED NONE DETECTED   Cannabinoid 50 Ng, Ur [place] POSITIVE (A) NONE DETECTED   Barbiturates, Ur Screen NONE DETECTED NONE DETECTED   Benzodiazepine, Ur Scrn NONE DETECTED NONE DETECTED   Methadone Scn, Ur NONE DETECTED NONE DETECTED    Comment: (NOTE) 947  Tricyclics, urine               Cutoff 1000 ng/mL 200  Amphetamines, urine             Cutoff 1000 ng/mL 300  MDMA (Ecstasy), urine           Cutoff 500 ng/mL 400  Cocaine Metabolite, urine       Cutoff 300 ng/mL 500  Opiate, urine                   Cutoff 300 ng/mL 600  Phencyclidine (PCP), urine      Cutoff 25 ng/mL 700  Cannabinoid, urine              Cutoff 50 ng/mL 800  Barbiturates, urine             Cutoff 200 ng/mL 900  Benzodiazepine, urine           Cutoff 200 ng/mL 1000 Methadone, urine                Cutoff 300 ng/mL 1100 1200 The urine drug screen provides only a preliminary, unconfirmed 1300 analytical test result and should not be used for non-medical 1400 purposes. Clinical consideration and professional judgment should 1500 be applied to any positive drug screen result due to possible 1600 interfering substances. A more specific alternate chemical method 1700 must be used in order to obtain a confirmed analytical result.  1800 Gas chromato graphy / mass spectrometry (GC/MS) is the preferred 1900 confirmatory method.   Urinalysis complete, with microscopic ([facility] only)     Status: Abnormal   Collection Time: 07/14/15  2:20 PM  Result Value Ref Range   Color, Urine YELLOW (A) YELLOW   APPearance CLEAR (A) CLEAR   Glucose, UA NEGATIVE NEGATIVE mg/dL   Bilirubin Urine NEGATIVE NEGATIVE   Ketones, ur TRACE (A) NEGATIVE mg/dL   Specific Gravity, Urine 1.023 1.005 - 1.030   Hgb urine dipstick 2+ (A) NEGATIVE   pH 5.0 5.0 - 8.0   Protein, ur NEGATIVE NEGATIVE mg/dL   Nitrite NEGATIVE NEGATIVE   Leukocytes, UA  NEGATIVE NEGATIVE   RBC /  HPF TOO NUMEROUS TO COUNT 0 - 5 RBC/hpf   WBC, UA NONE SEEN 0 - 5 WBC/hpf   Bacteria, UA NONE SEEN NONE SEEN   Squamous Epithelial / LPF 0-5 (A) NONE SEEN   Mucous PRESENT   Comprehensive metabolic panel     Status: Abnormal   Collection Time: 07/14/15  3:16 PM  Result Value Ref Range   Sodium 142 135 - 145 mmol/L   Potassium 3.8 3.5 - 5.1 mmol/L   Chloride 109 101 - 111 mmol/L   CO2 24 22 - 32 mmol/L   Glucose, Bld 90 65 - 99 mg/dL   BUN 13 6 - 20 mg/dL   Creatinine, Ser 0.59 0.44 - 1.00 mg/dL   Calcium 9.0 8.9 - 10.3 mg/dL   Total Protein 6.9 6.5 - 8.1 g/dL   Albumin 3.9 3.5 - 5.0 g/dL   AST 18 15 - 41 U/L   ALT 12 (L) 14 - 54 U/L   Alkaline Phosphatase 113 38 - 126 U/L   Total Bilirubin 0.4 0.3 - 1.2 mg/dL   GFR calc non Af Amer >60 >60 mL/min   GFR calc Af Amer >60 >60 mL/min    Comment: (NOTE) The eGFR has been calculated using the CKD EPI equation. This calculation has not been validated in all clinical situations. eGFR's persistently <60 mL/min signify possible Chronic Kidney Disease.    Anion gap 9 5 - 15  Salicylate level     Status: Abnormal   Collection Time: 07/14/15  3:16 PM  Result Value Ref Range   Salicylate Lvl 62.1 (HH) 2.8 - 30.0 mg/dL    Comment: CRITICAL RESULT CALLED TO, READ BACK BY AND VERIFIED WITH  AMY TEAGUE AT 1635 07/14/15 SDR   CBC     Status: Abnormal   Collection Time: 07/14/15  3:16 PM  Result Value Ref Range   WBC 7.2 3.6 - 11.0 K/uL   RBC 4.75 3.80 - 5.20 MIL/uL   Hemoglobin 13.9 12.0 - 16.0 g/dL   HCT 42.3 35.0 - 47.0 %   MCV 88.9 80.0 - 100.0 fL   MCH 29.2 26.0 - 34.0 pg   MCHC 32.8 32.0 - 36.0 g/dL   RDW 14.6 (H) 11.5 - 14.5 %   Platelets 274 150 - 440 K/uL  Acetaminophen level     Status: Abnormal   Collection Time: 07/14/15  3:16 PM  Result Value Ref Range   Acetaminophen (Tylenol), Serum <10 (L) 10 - 30 ug/mL    Comment:        THERAPEUTIC CONCENTRATIONS VARY SIGNIFICANTLY. A RANGE OF  10-30 ug/mL MAY BE AN EFFECTIVE CONCENTRATION FOR MANY PATIENTS. HOWEVER, SOME ARE BEST TREATED AT CONCENTRATIONS OUTSIDE THIS RANGE. ACETAMINOPHEN CONCENTRATIONS >150 ug/mL AT 4 HOURS AFTER INGESTION AND >50 ug/mL AT 12 HOURS AFTER INGESTION ARE OFTEN ASSOCIATED WITH TOXIC REACTIONS.   Ethanol     Status: None   Collection Time: 07/14/15  3:16 PM  Result Value Ref Range   Alcohol, Ethyl (B) <5 <5 mg/dL    Comment:        LOWEST DETECTABLE LIMIT FOR SERUM ALCOHOL IS 5 mg/dL FOR MEDICAL PURPOSES ONLY   Salicylate level     Status: None   Collection Time: 07/14/15  6:25 PM  Result Value Ref Range   Salicylate Lvl 30.8 2.8 - 30.0 mg/dL    Current Facility-Administered Medications  Medication Dose Route Frequency Provider Last Rate Last Dose  . doxycycline (VIBRA-TABS) tablet 100 mg  100 mg  Oral Q12H Orbie Pyo, MD   100 mg at 07/14/15 1604   Current Outpatient Prescriptions  Medication Sig Dispense Refill  . ALPRAZolam (XANAX) 0.5 MG tablet Take 0.5 mg by mouth 2 (two) times daily.    . Aspirin-Salicylamide-Caffeine (BC HEADACHE POWDER PO) Take by mouth as needed.    . citalopram (CELEXA) 40 MG tablet Take 40 mg by mouth daily. AM    . Ibuprofen-Diphenhydramine Cit (ADVIL PM PO) Take by mouth.    Marland Kitchen ipratropium-albuterol (DUONEB) 0.5-2.5 (3) MG/3ML SOLN       Musculoskeletal: Strength & Muscle Tone: within normal limits Gait & Station: normal Patient leans: N/A  Psychiatric Specialty Exam: Review of Systems  Constitutional: Negative.   HENT: Negative.   Eyes: Negative.   Respiratory: Negative.   Cardiovascular: Negative.   Gastrointestinal: Negative.   Musculoskeletal: Negative.   Skin: Negative.   Neurological: Negative.   Psychiatric/Behavioral: Positive for depression. Negative for suicidal ideas, hallucinations and substance abuse. The patient is nervous/anxious and has insomnia.     Blood pressure 153/92, pulse 108, temperature 98.2 F (36.8  C), temperature source Oral, resp. rate 20, height '4\' 11"'  (1.499 m), weight 47.628 kg (105 lb), SpO2 100 %.Body mass index is 21.2 kg/(m^2).  General Appearance: Casual  Eye Contact::  Good  Speech:  Pressured  Volume:  Normal  Mood:  Angry  Affect:  Full Range  Thought Process:  Goal Directed  Orientation:  Full (Time, Place, and Person)  Thought Content:  Negative  Suicidal Thoughts:  No  Homicidal Thoughts:  No  Memory:  Immediate;   Good Recent;   Fair Remote;   Fair  Judgement:  Fair  Insight:  Fair  Psychomotor Activity:  Normal  Concentration:  Fair  Recall:  [employer] of Knowledge:Fair  Language: Fair  Akathisia:  No  Handed:  Right  AIMS (if indicated):     Assets:  Communication Skills Desire for Improvement Housing Social Support  ADL's:  Intact  Cognition: WNL  Sleep:      Treatment Plan Summary: Plan 49 year old woman who is sent here from [place]. The coping paperwork describes her being tearful in talking about suicidal thinking when she was at [facility] this morning. During our interview the patient shows none of this. She was not tearful upset or sad. She was quite irritable and angry at [facility] for sending her here. She repeated consistently she had no actual thoughts of killing herself and no wish to die. Patient states multiple positive things in her life including mainly her grandchildren and her plans for the future. He also states that she feels like she would go to hell if she were to kill her self. Patient is agreeable to continuing with outpatient treatment although at this point she is dubious about whether she would go back to [place]. I don't think she is likely to benefit from hospitalization and I don't think she meets commitment criteria anymore. Patient's commitment is discontinued. She was counseled about possible treatments for depression. Because of her bad response to Zoloft in the past I did not suggest starting a new antidepressant. She will be referred to  [facility] and encouraged to continue with current medicine but not to abuse it. Case reviewed with emergency room doctor and psychiatry staff. She can be released from the emergency room.  Disposition: Patient does not meet criteria for psychiatric inpatient admission. Supportive therapy provided about ongoing stressors.  John Clapacs 07/14/2015 7:28 PM

## 2015-07-14 NOTE — BH Assessment (Signed)
Assessment Note  Caitlyn Evans is an 49 y.o. female who presents to the ER after being placed under IVC by the staff at [facility]. It was reported, she voiced SI with a plan to overdose and had an attempt in the past. According to the patient, she trick and was lead to believe she could share how she was feeling. She states, she's had thoughts about dying but had no plan or intent to do so. During the interview, the patient was suspicious and was questioning this Clinical research associate. She explained she was upset about being brought to the ER, for no reason and she wanted to go home. She also shared, trusting people was why she was in the ER and now she don't trust any type of mental health provider, "they twist your words."  Patient denies SI/HI and AV/H. She is vocal about wanting to go home and never see a counselor again.   Diagnosis: Depression  Past Medical History:  Past Medical History  Diagnosis Date  . Migraines   . Hypercholesterolemia   . Seasonal allergies   . Emphysema lung ([facility]) 05/30/2015  . Dysphagia, pharyngoesophageal phase 05/30/2015  . Lung mass   . Shortness of breath dyspnea     1 flight-stairs  . Anxiety     panic attacks  . Motion sickness     all moving vehicles  . Myocardial infarction [facility]) 2012    Past Surgical History  Procedure Laterality Date  . Vaginal hysterectomy    . Lung surgery Left     Upper lobe removed  . Oophorectomy Left   . Colonoscopy with propofol N/A 06/02/2015    Procedure: COLONOSCOPY WITH PROPOFOL;  Surgeon: Midge Minium, MD;  [facility];  Service: Endoscopy;  Laterality: N/A;  . Esophagogastroduodenoscopy (egd) with propofol N/A 06/02/2015    Procedure: ESOPHAGOGASTRODUODENOSCOPY (EGD) WITH PROPOFOL withdialation;  Surgeon: Midge Minium, MD;  Location[facility];  Service: Endoscopy;  Laterality: N/A;    Family History:  Family History  Problem Relation Age of Onset  . COPD Mother   . Heart attack Mother   . Stroke  Father   . COPD Father     Social History:  reports that she has quit smoking. She has never used smokeless tobacco. She reports that she does not drink alcohol or use illicit drugs.  Additional Social History:  Alcohol / Drug Use Pain Medications: See PTA Prescriptions: See PTA Over the Counter: See PTA History of alcohol / drug use?: No history of alcohol / drug abuse  CIWA: CIWA-Ar BP: (!) 153/92 mmHg Pulse Rate: (!) 108 COWS:    Allergies:  Allergies  Allergen Reactions  . Penicillins Hives  . Codeine Hives and Nausea Only    Home Medications:  (Not in a hospital admission)  OB/GYN Status:  No LMP recorded. Patient has had a hysterectomy.  General Assessment Data Location of Assessment: [facility] ED TTS Assessment: In system Is this a Tele or Face-to-Face Assessment?: Face-to-Face Is this an Initial Assessment or a Re-assessment for this encounter?: Initial Assessment Maiden name: n/a Is patient pregnant?: No Pregnancy Status: No Living Arrangements: Non-relatives/Friends, Spouse/significant other Can pt return to current living arrangement?: Yes Admission Status: Involuntary Is patient capable of signing voluntary admission?: Yes Referral Source: Other ([facility]) Insurance type: UHC  Medical Screening Exam [facility] Walk-in ONLY) Medical Exam completed: Yes  Crisis Care Plan Living Arrangements: Non-relatives/Friends, Spouse/significant other Name of Psychiatrist: None Name of Therapist: None  Education Status Is patient currently  in school?: No Current Grade: n/a Highest grade of school patient has completed: 12th Name of school: n/a Contact person: n/a  Risk to self with the past 6 months Suicidal Ideation: No Suicidal Intent: No Has patient had any suicidal intent within the past 6 months prior to admission? : No Is patient at risk for suicide?: No Suicidal Plan?: No Has patient had any suicidal plan within the past 6 months prior to admission? : No Access  to Means: No What has been your use of drugs/alcohol within the last 12 months?: None Reported Previous Attempts/Gestures: Yes How many times?: 1 Other Self Harm Risks: None Reported Triggers for Past Attempts: None known Intentional Self Injurious Behavior: None Family Suicide History: Unknown Recent stressful life event(s): Other (Comment) Persecutory voices/beliefs?: No Depression: Yes Depression Symptoms: Tearfulness, Feeling worthless/self pity, Feeling angry/irritable, Loss of interest in usual pleasures Substance abuse history and/or treatment for substance abuse?: No Suicide prevention information given to non-admitted patients: Not applicable  Risk to Others within the past 6 months Homicidal Ideation: No Does patient have any lifetime risk of violence toward others beyond the six months prior to admission? : No Thoughts of Harm to Others: No Current Homicidal Intent: No Current Homicidal Plan: No Access to Homicidal Means: No Identified Victim: None Reported History of harm to others?: No Assessment of Violence: None Noted Violent Behavior Description: None Reported Does patient have access to weapons?: No Criminal Charges Pending?: No Does patient have a court date: No Is patient on probation?: No  Psychosis Hallucinations: None noted Delusions: None noted  Mental Status Report Appearance/Hygiene: In scrubs, In hospital gown Eye Contact: Fair Motor Activity: Freedom of movement Speech: Logical/coherent, Soft Level of Consciousness: Alert Mood: Anxious, Suspicious Affect: Appropriate to circumstance, Anxious Anxiety Level: Minimal Thought Processes: Coherent, Relevant Judgement: Unimpaired Orientation: Person, Place, Time, Situation, Appropriate for developmental age Obsessive Compulsive Thoughts/Behaviors: None  Cognitive Functioning Concentration: Normal Memory: Recent Intact, Remote Intact IQ: Average Insight: Fair Impulse Control: Poor Appetite:  Fair Weight Loss: 0 Weight Gain: 0 Sleep: No Change Total Hours of Sleep: 8 Vegetative Symptoms: None  ADLScreening [facility] Assessment Services) Patient's cognitive ability adequate to safely complete daily activities?: Yes Patient able to express need for assistance with ADLs?: Yes Independently performs ADLs?: Yes (appropriate for developmental age)  Prior Inpatient Therapy Prior Inpatient Therapy: No Prior Therapy Dates: n/a Prior Therapy Facilty/Provider(s): n/a Reason for Treatment: n/a  Prior Outpatient Therapy Prior Outpatient Therapy: No Prior Therapy Dates: n/a Prior Therapy Facilty/Provider(s): n/a Reason for Treatment: n/a Does patient have an ACCT team?: No Does patient have Intensive In-House Services?  : No Does patient have [facility] services? : No Does patient have P4CC services?: No  ADL Screening (condition at time of admission) Patient's cognitive ability adequate to safely complete daily activities?: Yes Patient able to express need for assistance with ADLs?: Yes Independently performs ADLs?: Yes (appropriate for developmental age)       Abuse/Neglect Assessment (Assessment to be complete while patient is alone) Physical Abuse: Denies Verbal Abuse: Denies Sexual Abuse: Denies Exploitation of patient/patient's resources: Denies Self-Neglect: Denies Values / Beliefs Cultural Requests During Hospitalization: None Spiritual Requests During Hospitalization: None Consults Spiritual Care Consult Needed: No Social Work Consult Needed: No Merchant navy officer (For Healthcare) Does patient have an advance directive?: No Would patient like information on creating an advanced directive?: Yes English as a second language teacher given    Additional Information 1:1 In Past 12 Months?: No CIRT Risk: No Elopement Risk: No Does patient have  medical clearance?: Yes  Child/Adolescent Assessment Running Away Risk: Denies (Patient is an adult)  Disposition:   Disposition Initial Assessment Completed for this Encounter: Yes Disposition of Patient: Other dispositions (Psych MD to see) Other disposition(s): Other (Comment) (Psych MD to see)  On Site Evaluation by:   Reviewed with Physician:    Lilyan Gilfordalvin J. Jawan Chavarria, MS, LCAS, LPC, NCC, CCSI 07/14/2015 5:33 PM

## 2015-07-14 NOTE — ED Notes (Signed)
She has been seen by psychiatry  - she will be discharged to home   Her salicylate level is elevated - redraw has been performed  Once new level is obtained - EDP will consider discharging her

## 2015-07-14 NOTE — ED Notes (Signed)
Pt to ed with c/o depression and IVC papers from [facility].  Pt states she was told to share her feelings and she was honest and then ended up here in ED.  Pt adamantly denies SI, denies HI, denies hallucinations. Pt denies drug or ETOH use.

## 2015-07-14 NOTE — ED Provider Notes (Addendum)
[facility] Emergency Department Provider Note  ____________________________________________  Time seen: Approximately 3:30 PM  I have reviewed the triage vital signs and the nursing notes.   HISTORY  Chief Complaint Depression    HPI Caitlyn Evans is a 49 y.o. female with a history of anxiety, panic attacks and COPD who is presenting today after being committed at [facility]. She told the [facility] staff that she "did not want to be here anymore." However, she says that he can't get in to the [facility] of having if you kill yourself. She says that she will not kill herself because of her religion. She also notes that she has a gun, pills and knives at home and that if she wanted to kill herself she would already be dead.  She is also complaining of a cough with wheezing. Denies any fever at home.Still smoking.   Past Medical History  Diagnosis Date  . Migraines   . Hypercholesterolemia   . Seasonal allergies   . Emphysema lung (HCC) 05/30/2015  . Dysphagia, pharyngoesophageal phase 05/30/2015  . Lung mass   . Shortness of breath dyspnea     1 flight-stairs  . Anxiety     panic attacks  . Motion sickness     all moving vehicles  . Myocardial infarction [facility]) 2012    Patient Active Problem List   Diagnosis Date Noted  . Depression, major, single episode, moderate (HCC) 07/14/2015  . Adjustment disorder with anxiety 07/14/2015  . COPD (chronic obstructive pulmonary disease) (HCC) 07/14/2015  . Special screening for malignant neoplasms, colon   . Swallowing difficulty   . Loss of weight   . Esophagogastric ulcer   . Esophageal ulcer   . Hypertension 05/30/2015  . Emphysema lung (HCC) 05/30/2015  . Dysphagia, pharyngoesophageal phase 05/30/2015    Past Surgical History  Procedure Laterality Date  . Vaginal hysterectomy    . Lung surgery Left     Upper lobe removed  . Oophorectomy Left   . Colonoscopy with propofol N/A 06/02/2015    Procedure: COLONOSCOPY  WITH PROPOFOL;  Surgeon: Midge Minium, MD;  Location: [facility];  Service: Endoscopy;  Laterality: N/A;  . Esophagogastroduodenoscopy (egd) with propofol N/A 06/02/2015    Procedure: ESOPHAGOGASTRODUODENOSCOPY (EGD) WITH PROPOFOL withdialation;  Surgeon: Midge Minium, MD;  Location: [facility];  Service: Endoscopy;  Laterality: N/A;    Current Outpatient Rx  Name  Route  Sig  Dispense  Refill  . ALPRAZolam (XANAX) 0.5 MG tablet   Oral   Take 0.5 mg by mouth 2 (two) times daily.         . Aspirin-Salicylamide-Caffeine (BC HEADACHE POWDER PO)   Oral   Take by mouth as needed.         . citalopram (CELEXA) 40 MG tablet   Oral   Take 40 mg by mouth daily. AM         . Ibuprofen-Diphenhydramine Cit (ADVIL PM PO)   Oral   Take by mouth.         Marland Kitchen ipratropium-albuterol (DUONEB) 0.5-2.5 (3) MG/3ML SOLN                 Allergies Penicillins and Codeine  Family History  Problem Relation Age of Onset  . COPD Mother   . Heart attack Mother   . Stroke Father   . COPD Father     Social History Social History  Substance Use Topics  . Smoking status: Former Smoker -- 1.00 packs/day  for 30 years  . Smokeless tobacco: Never Used     Comment: quit some time in 2012  . Alcohol Use: No     Comment: rare consumption    Review of Systems Constitutional: No fever/chills Eyes: No visual changes. ENT: No sore throat. Cardiovascular: Denies chest pain. Respiratory: Shortness of breath as above.  Gastrointestinal: No abdominal pain.  No nausea, no vomiting.  No diarrhea.  No constipation. Genitourinary: Negative for dysuria. Musculoskeletal: Negative for back pain. Skin: Negative for rash. Neurological: Negative for headaches, focal weakness or numbness.  10-point ROS otherwise negative.  ____________________________________________   PHYSICAL EXAM:  VITAL SIGNS: ED Triage Vitals  Enc Vitals Group     BP 07/14/15 1400 153/92 mmHg     Pulse Rate  07/14/15 1400 108     Resp 07/14/15 1400 20     Temp 07/14/15 1400 98.2 F (36.8 C)     Temp Source 07/14/15 1400 Oral     SpO2 07/14/15 1400 100 %     Weight 07/14/15 1400 105 lb (47.628 kg)     Height 07/14/15 1400  (1.499 m)     Head Cir --      Peak Flow --      Pain Score 07/14/15 1401 0     Pain Loc --      Pain Edu? --      Excl. in GC? --     Constitutional: Alert and oriented. Well appearing and in no acute distress. Eyes: Conjunctivae are normal. PERRL. EOMI. Head: Atraumatic. Nose: No congestion/rhinnorhea. Mouth/Throat: Mucous membranes are moist.  Oropharynx non-erythematous. Neck: No stridor.   Cardiovascular: Normal rate, regular rhythm. Grossly normal heart sounds.  Good peripheral circulation. Respiratory: Normal respiratory effort.  No retractions. Speaking in full sentences. However, does have diffuse wheezing with expiratory cough.  Gastrointestinal: Soft and nontender. No distention. No abdominal bruits. No CVA tenderness. Musculoskeletal: No lower extremity tenderness nor edema.  No joint effusions. Neurologic:  Normal speech and language. No gross focal neurologic deficits are appreciated. No gait instability. Skin:  Skin is warm, dry and intact. No rash noted. Psychiatric: Mood and affect are normal. Speech and behavior are normal.  ____________________________________________   LABS (all labs ordered are listed, but only abnormal results are displayed)  Labs Reviewed  COMPREHENSIVE METABOLIC PANEL - Abnormal; Notable for the following:    ALT 12 (*)    All other components within normal limits  SALICYLATE LEVEL - Abnormal; Notable for the following:    Salicylate Lvl 30.9 (*)    All other components within normal limits  CBC - Abnormal; Notable for the following:    RDW 14.6 (*)    All other components within normal limits  URINE DRUG SCREEN, QUALITATIVE ([facility] ONLY) - Abnormal; Notable for the following:    Cannabinoid 50 Ng, Ur [place] POSITIVE  (*)    All other components within normal limits  ACETAMINOPHEN LEVEL - Abnormal; Notable for the following:    Acetaminophen (Tylenol), Serum <10 (*)    All other components within normal limits  URINALYSIS COMPLETEWITH MICROSCOPIC ([facility] ONLY) - Abnormal; Notable for the following:    Color, Urine YELLOW (*)    APPearance CLEAR (*)    Ketones, ur TRACE (*)    Hgb urine dipstick 2+ (*)    Squamous Epithelial / LPF 0-5 (*)    All other components within normal limits  ETHANOL  SALICYLATE LEVEL   ____________________________________________  EKG   ____________________________________________  RADIOLOGY  ____________________________________________   PROCEDURES    ____________________________________________   INITIAL IMPRESSION / ASSESSMENT AND PLAN / ED COURSE  Pertinent labs & imaging results that were available during my care of the patient were reviewed by me and considered in my medical decision making (see chart for details).  Patient aware of her involuntarily committed status and knows that she must stay to see the psychiatrist here in the emergency department. She'll be given a DuoNeb as well as steroids. We'll also treat with antibiotics because of COPD history.  ----------------------------------------- 6:16 PM on 07/14/2015 -----------------------------------------  Patient has been cleared by psychiatry. Says that she takes multiple BC powder treatments per day. Denies any suicidal intent. We'll repeat aspirin level to make sure it is trending downward.  ----------------------------------------- 7:47 PM on 07/14/2015 -----------------------------------------  Patient resting comfortably at this time. Has been cleared by psychiatry for discharge to home. Psychiatry recommends follow-up with either [facility] or her primary care doctor. She continues to deny any suicidal or homicidal intent. Her lungs were reassessed and she only has scant wheezing now. She has  good air movement. Has been ambulate without any shortness of breath. Says she has a nebulizer at home. We'll discharge her with steroids and antibiotics. ____________________________________________   FINAL CLINICAL IMPRESSION(S) / ED DIAGNOSES  Depression. Acute COPD exacerbation.    Myrna Blazeravid Matthew Sye Schroepfer, MD 07/14/15 1948  Additionally, I discussed with the patient only taking 1 aspirin or BC powder per day. She understands that taking more than prescribed or instructed to on her medication may result in irreversible damage and death. She understands the plan and willing to comply.    Myrna Blazeravid Matthew Elion Hocker, MD 07/14/15 [phone]

## 2015-07-14 NOTE — [facility] Counselor (Signed)
Per request of Psych MD (Dr. Toni Amendlapacs), writer provided the pt. with information and instructions on how to access [facility]patient [facility] [employer]([facility])

## 2015-07-14 NOTE — ED Notes (Signed)

## 2015-07-21 DIAGNOSIS — J449 Chronic obstructive pulmonary disease, unspecified: Secondary | ICD-10-CM | POA: Diagnosis not present

## 2015-07-21 DIAGNOSIS — I119 Hypertensive heart disease without heart failure: Secondary | ICD-10-CM | POA: Diagnosis not present

## 2015-07-21 DIAGNOSIS — K222 Esophageal obstruction: Secondary | ICD-10-CM | POA: Diagnosis not present

## 2015-07-21 DIAGNOSIS — F419 Anxiety disorder, unspecified: Secondary | ICD-10-CM | POA: Diagnosis not present

## 2015-08-18 DIAGNOSIS — J42 Unspecified chronic bronchitis: Secondary | ICD-10-CM | POA: Diagnosis not present

## 2015-09-27 DIAGNOSIS — J42 Unspecified chronic bronchitis: Secondary | ICD-10-CM | POA: Diagnosis not present

## 2015-09-27 DIAGNOSIS — J439 Emphysema, unspecified: Secondary | ICD-10-CM | POA: Diagnosis not present

## 2015-09-27 DIAGNOSIS — J00 Acute nasopharyngitis [common cold]: Secondary | ICD-10-CM | POA: Diagnosis not present

## 2015-09-27 DIAGNOSIS — J208 Acute bronchitis due to other specified organisms: Secondary | ICD-10-CM | POA: Diagnosis not present

## 2015-10-04 DIAGNOSIS — G43919 Migraine, unspecified, intractable, without status migrainosus: Secondary | ICD-10-CM | POA: Diagnosis not present

## 2015-10-04 DIAGNOSIS — J40 Bronchitis, not specified as acute or chronic: Secondary | ICD-10-CM | POA: Diagnosis not present

## 2015-10-04 DIAGNOSIS — I119 Hypertensive heart disease without heart failure: Secondary | ICD-10-CM | POA: Diagnosis not present

## 2015-10-04 DIAGNOSIS — K222 Esophageal obstruction: Secondary | ICD-10-CM | POA: Diagnosis not present

## 2015-10-11 DIAGNOSIS — I119 Hypertensive heart disease without heart failure: Secondary | ICD-10-CM | POA: Diagnosis not present

## 2015-10-11 DIAGNOSIS — J449 Chronic obstructive pulmonary disease, unspecified: Secondary | ICD-10-CM | POA: Diagnosis not present

## 2015-10-11 DIAGNOSIS — J40 Bronchitis, not specified as acute or chronic: Secondary | ICD-10-CM | POA: Diagnosis not present

## 2015-10-11 DIAGNOSIS — K222 Esophageal obstruction: Secondary | ICD-10-CM | POA: Diagnosis not present

## 2015-11-08 DIAGNOSIS — J449 Chronic obstructive pulmonary disease, unspecified: Secondary | ICD-10-CM | POA: Diagnosis not present

## 2015-11-08 DIAGNOSIS — I119 Hypertensive heart disease without heart failure: Secondary | ICD-10-CM | POA: Diagnosis not present

## 2015-11-08 DIAGNOSIS — J9610 Chronic respiratory failure, unspecified whether with hypoxia or hypercapnia: Secondary | ICD-10-CM | POA: Diagnosis not present

## 2015-11-08 DIAGNOSIS — J439 Emphysema, unspecified: Secondary | ICD-10-CM | POA: Diagnosis not present

## 2015-12-05 ENCOUNTER — Inpatient Hospital Stay
Admission: EM | Admit: 2015-12-05 | Discharge: 2015-12-09 | DRG: 190 | Disposition: A | Discharge status: Home / Self Care | Payer: Medicare Other | Attending: Internal Medicine | Admitting: Internal Medicine

## 2015-12-05 ENCOUNTER — Emergency Department: Payer: Medicare Other

## 2015-12-05 DIAGNOSIS — Z8249 Family history of ischemic heart disease and other diseases of the circulatory system: Secondary | ICD-10-CM

## 2015-12-05 DIAGNOSIS — R918 Other nonspecific abnormal finding of lung field: Secondary | ICD-10-CM | POA: Diagnosis not present

## 2015-12-05 DIAGNOSIS — I10 Essential (primary) hypertension: Secondary | ICD-10-CM | POA: Diagnosis present

## 2015-12-05 DIAGNOSIS — I252 Old myocardial infarction: Secondary | ICD-10-CM | POA: Diagnosis not present

## 2015-12-05 DIAGNOSIS — E7800 Pure hypercholesterolemia, unspecified: Secondary | ICD-10-CM | POA: Diagnosis present

## 2015-12-05 DIAGNOSIS — E785 Hyperlipidemia, unspecified: Secondary | ICD-10-CM | POA: Diagnosis present

## 2015-12-05 DIAGNOSIS — J209 Acute bronchitis, unspecified: Secondary | ICD-10-CM | POA: Diagnosis not present

## 2015-12-05 DIAGNOSIS — Z7951 Long term (current) use of inhaled steroids: Secondary | ICD-10-CM | POA: Diagnosis not present

## 2015-12-05 DIAGNOSIS — J441 Chronic obstructive pulmonary disease with (acute) exacerbation: Secondary | ICD-10-CM | POA: Diagnosis present

## 2015-12-05 DIAGNOSIS — F410 Panic disorder [episodic paroxysmal anxiety] without agoraphobia: Secondary | ICD-10-CM | POA: Diagnosis present

## 2015-12-05 DIAGNOSIS — F17210 Nicotine dependence, cigarettes, uncomplicated: Secondary | ICD-10-CM | POA: Diagnosis present

## 2015-12-05 DIAGNOSIS — J9601 Acute respiratory failure with hypoxia: Secondary | ICD-10-CM | POA: Diagnosis present

## 2015-12-05 DIAGNOSIS — Z886 Allergy status to analgesic agent status: Secondary | ICD-10-CM | POA: Diagnosis not present

## 2015-12-05 DIAGNOSIS — Z880 Allergy status to penicillin: Secondary | ICD-10-CM

## 2015-12-05 DIAGNOSIS — J101 Influenza due to other identified influenza virus with other respiratory manifestations: Secondary | ICD-10-CM | POA: Diagnosis present

## 2015-12-05 DIAGNOSIS — I2510 Atherosclerotic heart disease of native coronary artery without angina pectoris: Secondary | ICD-10-CM | POA: Diagnosis present

## 2015-12-05 DIAGNOSIS — Z7952 Long term (current) use of systemic steroids: Secondary | ICD-10-CM | POA: Diagnosis not present

## 2015-12-05 DIAGNOSIS — J8489 Other specified interstitial pulmonary diseases: Secondary | ICD-10-CM | POA: Diagnosis present

## 2015-12-05 DIAGNOSIS — Z825 Family history of asthma and other chronic lower respiratory diseases: Secondary | ICD-10-CM

## 2015-12-05 DIAGNOSIS — G43909 Migraine, unspecified, not intractable, without status migrainosus: Secondary | ICD-10-CM | POA: Diagnosis present

## 2015-12-05 DIAGNOSIS — J9600 Acute respiratory failure, unspecified whether with hypoxia or hypercapnia: Secondary | ICD-10-CM | POA: Diagnosis not present

## 2015-12-05 DIAGNOSIS — B349 Viral infection, unspecified: Secondary | ICD-10-CM

## 2015-12-05 DIAGNOSIS — R739 Hyperglycemia, unspecified: Secondary | ICD-10-CM | POA: Diagnosis present

## 2015-12-05 DIAGNOSIS — Z823 Family history of stroke: Secondary | ICD-10-CM | POA: Diagnosis not present

## 2015-12-05 DIAGNOSIS — J029 Acute pharyngitis, unspecified: Secondary | ICD-10-CM | POA: Diagnosis not present

## 2015-12-05 DIAGNOSIS — J440 Chronic obstructive pulmonary disease with acute lower respiratory infection: Secondary | ICD-10-CM | POA: Diagnosis not present

## 2015-12-05 DIAGNOSIS — F4322 Adjustment disorder with anxiety: Secondary | ICD-10-CM | POA: Diagnosis not present

## 2015-12-05 DIAGNOSIS — Z792 Long term (current) use of antibiotics: Secondary | ICD-10-CM

## 2015-12-05 DIAGNOSIS — R0602 Shortness of breath: Secondary | ICD-10-CM | POA: Diagnosis not present

## 2015-12-05 DIAGNOSIS — Z885 Allergy status to narcotic agent status: Secondary | ICD-10-CM

## 2015-12-05 DIAGNOSIS — J302 Other seasonal allergic rhinitis: Secondary | ICD-10-CM | POA: Diagnosis present

## 2015-12-05 HISTORY — DX: Other specified interstitial pulmonary diseases: J84.89

## 2015-12-05 LAB — COMPREHENSIVE METABOLIC PANEL
ALT: 19 U/L (ref 14–54)
AST: 24 U/L (ref 15–41)
Albumin: 4.2 g/dL (ref 3.5–5.0)
Alkaline Phosphatase: 81 U/L (ref 38–126)
Anion gap: 11 (ref 5–15)
BILIRUBIN TOTAL: 0.3 mg/dL (ref 0.3–1.2)
BUN: 18 mg/dL (ref 6–20)
CHLORIDE: 105 mmol/L (ref 101–111)
CO2: 24 mmol/L (ref 22–32)
CREATININE: 0.73 mg/dL (ref 0.44–1.00)
Calcium: 9.1 mg/dL (ref 8.9–10.3)
GFR calc Af Amer: >60 mL/min (ref >60)
Glucose, Bld: 111 mg/dL — ABNORMAL HIGH (ref 65–99)
Potassium: 4 mmol/L (ref 3.5–5.1)
Sodium: 140 mmol/L (ref 135–145)
Total Protein: 7.3 g/dL (ref 6.5–8.1)

## 2015-12-05 LAB — CBC WITH DIFFERENTIAL/PLATELET
BASOS ABS: 0 10*3/uL (ref 0–0.1)
Basophils Relative: 0 %
Eosinophils Absolute: 0 10*3/uL (ref 0–0.7)
Eosinophils Relative: 0 %
HEMATOCRIT: 42.1 % (ref 35.0–47.0)
Hemoglobin: 13.4 g/dL (ref 12.0–16.0)
LYMPHS PCT: 5 %
Lymphs Abs: 0.7 10*3/uL — ABNORMAL LOW (ref 1.0–3.6)
MCH: 28 pg (ref 26.0–34.0)
MCHC: 31.9 g/dL — ABNORMAL LOW (ref 32.0–36.0)
MCV: 87.8 fL (ref 80.0–100.0)
Monocytes Absolute: 1.1 10*3/uL — ABNORMAL HIGH (ref 0.2–0.9)
Monocytes Relative: 8 %
NEUTROS ABS: 12.4 10*3/uL — AB (ref 1.4–6.5)
Neutrophils Relative %: 87 %
PLATELETS: 316 10*3/uL (ref 150–440)
RBC: 4.79 MIL/uL (ref 3.80–5.20)
RDW: 18.9 % — ABNORMAL HIGH (ref 11.5–14.5)
WBC: 14.2 10*3/uL — AB (ref 3.6–11.0)

## 2015-12-05 LAB — BLOOD GAS, ARTERIAL
ALLENS TEST (PASS/FAIL): POSITIVE — AB
Acid-Base Excess: 4.2 mmol/L — ABNORMAL HIGH (ref 0.0–3.0)
BICARBONATE: 26.5 meq/L (ref 21.0–28.0)
DELIVERY SYSTEMS: POSITIVE
Expiratory PAP: 5
FIO2: 0.35
Inspiratory PAP: 12
O2 Saturation: 99.8 %
PATIENT TEMPERATURE: 37
RATE: 12 resp/min
pCO2 arterial: 31 mmHg — ABNORMAL LOW (ref 32.0–48.0)
pH, Arterial: 7.54 — ABNORMAL HIGH (ref 7.350–7.450)
pO2, Arterial: 192 mmHg — ABNORMAL HIGH (ref 83.0–108.0)

## 2015-12-05 LAB — MAGNESIUM: MAGNESIUM: 2.2 mg/dL (ref 1.7–2.4)

## 2015-12-05 MED ORDER — LEVALBUTEROL HCL 1.25 MG/0.5ML IN NEBU
1.2500 mg | INHALATION_SOLUTION | Freq: Four times a day (QID) | RESPIRATORY_TRACT | Status: DC
  Administered 2015-12-06 (×2): 1.25 mg via RESPIRATORY_TRACT
  Filled 2015-12-05 (×2): qty 0.5

## 2015-12-05 MED ORDER — METHYLPREDNISOLONE SODIUM SUCC 125 MG IJ SOLR
INTRAMUSCULAR | Status: AC
  Filled 2015-12-05: qty 2

## 2015-12-05 MED ORDER — ALBUTEROL SULFATE (2.5 MG/3ML) 0.083% IN NEBU
5.0000 mg | INHALATION_SOLUTION | Freq: Once | RESPIRATORY_TRACT | Status: AC
  Administered 2015-12-05: 5 mg via RESPIRATORY_TRACT

## 2015-12-05 MED ORDER — CHLORHEXIDINE GLUCONATE 0.12 % MT SOLN
15.0000 mL | Freq: Two times a day (BID) | OROMUCOSAL | Status: DC
  Administered 2015-12-06 – 2015-12-08 (×5): 15 mL via OROMUCOSAL
  Filled 2015-12-05 (×4): qty 15

## 2015-12-05 MED ORDER — IPRATROPIUM-ALBUTEROL 0.5-2.5 (3) MG/3ML IN SOLN
3.0000 mL | RESPIRATORY_TRACT | Status: DC | PRN
  Administered 2015-12-06 – 2015-12-09 (×6): 3 mL via RESPIRATORY_TRACT
  Filled 2015-12-05 (×5): qty 3

## 2015-12-05 MED ORDER — SODIUM CHLORIDE 0.9% FLUSH
3.0000 mL | Freq: Two times a day (BID) | INTRAVENOUS | Status: DC
  Administered 2015-12-06 – 2015-12-09 (×7): 3 mL via INTRAVENOUS

## 2015-12-05 MED ORDER — IPRATROPIUM-ALBUTEROL 0.5-2.5 (3) MG/3ML IN SOLN
3.0000 mL | Freq: Once | RESPIRATORY_TRACT | Status: AC
  Administered 2015-12-05: 3 mL via RESPIRATORY_TRACT

## 2015-12-05 MED ORDER — ACETAMINOPHEN 325 MG PO TABS
650.0000 mg | ORAL_TABLET | Freq: Four times a day (QID) | ORAL | Status: DC | PRN
  Administered 2015-12-06 – 2015-12-08 (×5): 650 mg via ORAL
  Filled 2015-12-05 (×5): qty 2

## 2015-12-05 MED ORDER — ACETAMINOPHEN 650 MG RE SUPP: 650.0000 mg | Freq: Four times a day (QID) | RECTAL | Status: DC | PRN

## 2015-12-05 MED ORDER — METHYLPREDNISOLONE SODIUM SUCC 125 MG IJ SOLR
125.0000 mg | Freq: Once | INTRAMUSCULAR | Status: AC
  Administered 2015-12-05: 125 mg via INTRAVENOUS
  Filled 2015-12-05: qty 2

## 2015-12-05 MED ORDER — LORAZEPAM 2 MG/ML IJ SOLN
1.0000 mg | Freq: Once | INTRAMUSCULAR | Status: AC
  Administered 2015-12-05: 1 mg via INTRAVENOUS

## 2015-12-05 MED ORDER — ENOXAPARIN SODIUM 40 MG/0.4ML ~~LOC~~ SOLN
40.0000 mg | SUBCUTANEOUS | Status: DC
  Filled 2015-12-05 (×3): qty 0.4

## 2015-12-05 MED ORDER — SODIUM CHLORIDE 0.9 % IV BOLUS (SEPSIS)
1000.0000 mL | INTRAVENOUS | Status: AC
Start: 2015-12-05 — End: 2015-12-05
  Administered 2015-12-05: 1000 mL via INTRAVENOUS

## 2015-12-05 MED ORDER — LEVOFLOXACIN IN D5W 500 MG/100ML IV SOLN
500.0000 mg | INTRAVENOUS | Status: DC
  Administered 2015-12-06 (×2): 500 mg via INTRAVENOUS
  Filled 2015-12-05 (×3): qty 100

## 2015-12-05 MED ORDER — PHENOL 1.4 % MT LIQD: 1.0000 | OROMUCOSAL | Status: DC | PRN

## 2015-12-05 MED ORDER — CETYLPYRIDINIUM CHLORIDE 0.05 % MT LIQD
7.0000 mL | Freq: Two times a day (BID) | OROMUCOSAL | Status: DC
  Administered 2015-12-07 (×2): 7 mL via OROMUCOSAL

## 2015-12-05 MED ORDER — PHENOL 1.4 % MT LIQD
2.0000 | OROMUCOSAL | Status: DC | PRN
  Administered 2015-12-06: 2 via OROMUCOSAL
  Filled 2015-12-05 (×2): qty 177

## 2015-12-05 MED ORDER — ALBUTEROL SULFATE (2.5 MG/3ML) 0.083% IN NEBU
5.0000 mg | INHALATION_SOLUTION | Freq: Once | RESPIRATORY_TRACT | Status: AC
  Administered 2015-12-05: 5 mg via RESPIRATORY_TRACT
  Filled 2015-12-05 (×2): qty 6

## 2015-12-05 MED ORDER — MORPHINE SULFATE (PF) 2 MG/ML IV SOLN
2.0000 mg | INTRAVENOUS | Status: DC | PRN
  Administered 2015-12-05 – 2015-12-09 (×5): 2 mg via INTRAVENOUS
  Filled 2015-12-05 (×5): qty 1

## 2015-12-05 MED ORDER — IPRATROPIUM-ALBUTEROL 0.5-2.5 (3) MG/3ML IN SOLN
3.0000 mL | Freq: Once | RESPIRATORY_TRACT | Status: AC
  Administered 2015-12-05: 3 mL via RESPIRATORY_TRACT
  Filled 2015-12-05: qty 3

## 2015-12-05 MED ORDER — MAGNESIUM SULFATE 2 GM/50ML IV SOLN
2.0000 g | Freq: Once | INTRAVENOUS | Status: AC
  Administered 2015-12-05: 2 g via INTRAVENOUS
  Filled 2015-12-05: qty 50

## 2015-12-05 MED ORDER — METHYLPREDNISOLONE SODIUM SUCC 125 MG IJ SOLR
60.0000 mg | Freq: Four times a day (QID) | INTRAMUSCULAR | Status: DC
  Administered 2015-12-06 (×2): 60 mg via INTRAVENOUS
  Filled 2015-12-05 (×2): qty 2

## 2015-12-05 MED ORDER — LORAZEPAM 2 MG/ML IJ SOLN
INTRAMUSCULAR | Status: AC
  Administered 2015-12-05: 1 mg via INTRAVENOUS
  Filled 2015-12-05: qty 1

## 2015-12-05 MED ORDER — LABETALOL HCL 5 MG/ML IV SOLN
10.0000 mg | INTRAVENOUS | Status: DC | PRN
  Filled 2015-12-05: qty 4

## 2015-12-05 MED ORDER — MORPHINE SULFATE (PF) 4 MG/ML IV SOLN
4.0000 mg | Freq: Once | INTRAVENOUS | Status: AC
  Administered 2015-12-05: 4 mg via INTRAVENOUS
  Filled 2015-12-05: qty 1

## 2015-12-05 NOTE — H&P (Signed)
[facility] [place] [facility]   PATIENT NAME: Caitlyn Evans    MR#:  [id number]  DATE OF BIRTH:  Jan 29, 1966  DATE OF ADMISSION:  12/05/2015  PRIMARY CARE PHYSICIAN: Caitlyn Downs, MD   REQUESTING/REFERRING PHYSICIAN: York Evans, M.D.  CHIEF COMPLAINT:   Chief Complaint  Patient presents with  . Shortness of Breath    HISTORY OF PRESENT ILLNESS:  Caitlyn Evans  is a 50 y.o. female who presents with acute onset shortness of breath. Patient has long-standing history of COPD, with multiple exacerbations in the past, some including the need for intubation. Per her husband, who is at bedside with her in the ED today and contributes to the history, she was also recently diagnosed with BOOP by her pulmonologist. Her husband also states that she recently started smoking again. Patient states that for the past several days she's had some upper respiratory symptoms, with a sore throat, and if low-grade fever today 101 Fahrenheit. She also states that she's had a change in coloration of her sputum from white to greenish over the last day or so. She came to the ED acutely short of breath, required BiPAP for oxygenation. She was given treatment upfront for COPD exacerbation. The only other strong abnormality in her lab workup was very mildly elevated white blood cell count at 14. Chest x-ray did not show any acute pneumonia. Hospitalists were called for admission  PAST MEDICAL HISTORY:   Past Medical History  Diagnosis Date  . Migraines   . Hypercholesterolemia   . Seasonal allergies   . Emphysema lung (HCC) 05/30/2015  . Dysphagia, pharyngoesophageal phase 05/30/2015  . Lung mass   . Shortness of breath dyspnea     1 flight-stairs  . Anxiety     panic attacks  . Motion sickness     all moving vehicles  . Myocardial infarction (HCC) 2012  . BOOP (bronchiolitis obliterans with organizing pneumonia) (HCC)     PAST SURGICAL HISTORY:   Past Surgical History   Procedure Laterality Date  . Vaginal hysterectomy    . Lung surgery Left     Upper lobe removed  . Oophorectomy Left   . Colonoscopy with propofol N/A 06/02/2015    Procedure: COLONOSCOPY WITH PROPOFOL;  Surgeon: Midge Minium, MD;  Location: [facility];  Service: Endoscopy;  Laterality: N/A;  . Esophagogastroduodenoscopy (egd) with propofol N/A 06/02/2015    Procedure: ESOPHAGOGASTRODUODENOSCOPY (EGD) WITH PROPOFOL withdialation;  Surgeon: Midge Minium, MD;  Location: [facility];  Service: Endoscopy;  Laterality: N/A;    SOCIAL HISTORY:   Social History  Substance Use Topics  . Smoking status: Former Smoker -- 1.00 packs/day for 30 years  . Smokeless tobacco: Never Used     Comment: quit some time in 2012  . Alcohol Use: No     Comment: rare consumption    FAMILY HISTORY:   Family History  Problem Relation Age of Onset  . COPD Mother   . Heart attack Mother   . Stroke Father   . COPD Father     DRUG ALLERGIES:   Allergies  Allergen Reactions  . Penicillins Hives and Other (See Comments)    Has patient had a PCN reaction causing immediate rash, facial/tongue/throat swelling, SOB or lightheadedness with hypotension: No Has patient had a PCN reaction causing severe rash involving mucus membranes or skin necrosis: No Has patient had a PCN reaction that required hospitalization No Has patient had a PCN reaction occurring within the last 10 years:  No If all of the above answers are "NO", then may proceed with Cephalosporin use.  . Codeine Hives and Nausea And Vomiting    MEDICATIONS AT HOME:   Prior to Admission medications   Medication Sig Start Date End Date Taking? Authorizing Provider  ALPRAZolam (XANAX) 0.25 MG tablet Take 0.25 mg by mouth 2 (two) times daily.   Yes Historical Provider, MD  doxycycline (VIBRA-TABS) 100 MG tablet Take 100 mg by mouth daily.   Yes Historical Provider, MD  HYDROcodone-acetaminophen (NORCO) 10-325 MG tablet Take 0.5  tablets by mouth 2 (two) times daily.   Yes Historical Provider, MD  ipratropium-albuterol (DUONEB) 0.5-2.5 (3) MG/3ML SOLN Inhale 3 mLs into the lungs every 4 (four) hours as needed (for wheezing/shortness of breath).    Yes Historical Provider, MD  predniSONE (DELTASONE) 20 MG tablet Take 20 mg by mouth daily with breakfast.   Yes Historical Provider, MD  zolpidem (AMBIEN) 10 MG tablet Take 10 mg by mouth at bedtime.   Yes Historical Provider, MD  doxycycline (VIBRA-TABS) 100 MG tablet Take 1 tablet (100 mg total) by mouth 2 (two) times daily. Patient not taking: Reported on 12/05/2015 07/14/15   Myrna Blazeravid Matthew Schaevitz, MD  predniSONE (DELTASONE) 20 MG tablet Take 3 tablets (60 mg total) by mouth daily with breakfast. Patient not taking: Reported on 12/05/2015 07/14/15   Myrna Blazeravid Matthew Schaevitz, MD    REVIEW OF SYSTEMS:  Review of Systems  Constitutional: Positive for fever. Negative for chills, weight loss and malaise/fatigue.  HENT: Positive for sore throat. Negative for ear pain, hearing loss and tinnitus.   Eyes: Negative for blurred vision, double vision, pain and redness.  Respiratory: Positive for cough, sputum production, shortness of breath and wheezing. Negative for hemoptysis.   Cardiovascular: Negative for chest pain, palpitations, orthopnea and leg swelling.  Gastrointestinal: Negative for nausea, vomiting, abdominal pain, diarrhea and constipation.  Genitourinary: Negative for dysuria, frequency and hematuria.  Musculoskeletal: Negative for back pain, joint pain and neck pain.  Skin:       No acne, rash, or lesions  Neurological: Negative for dizziness, tremors, focal weakness and weakness.  Endo/Heme/Allergies: Negative for polydipsia. Does not bruise/bleed easily.  Psychiatric/Behavioral: Negative for depression. The patient is nervous/anxious. The patient does not have insomnia.      VITAL SIGNS:   Filed Vitals:   12/05/15 1900 12/05/15 1930 12/05/15 2007 12/05/15 2030   BP: 153/106 141/80 121/72   Pulse: 111 116 114 107  Temp:      TempSrc:      Resp: 31 33 32 29  Height:      Weight:      SpO2: 100% 100% 100% 100%   Wt Readings from Last 3 Encounters:  12/05/15 52.164 kg (115 lb)  07/14/15 47.628 kg (105 lb)  06/02/15 49.442 kg (109 lb)    PHYSICAL EXAMINATION:  Physical Exam  Vitals reviewed. Constitutional: She is oriented to person, place, and time. She appears well-developed and well-nourished. She appears distressed.  HENT:  Head: Normocephalic and atraumatic.  Mouth/Throat: Oropharynx is clear and moist.  Eyes: Conjunctivae and EOM are normal. Pupils are equal, round, and reactive to light. No scleral icterus.  Neck: Normal range of motion. Neck supple. No JVD present. No thyromegaly present.  Cardiovascular: Regular rhythm and intact distal pulses.  Exam reveals no gallop and no friction rub.   No murmur heard. Tachycardic  Respiratory: She is in respiratory distress. She has wheezes. She has no rales.  Diffuse coarse breath  sounds  GI: Soft. Bowel sounds are normal. She exhibits no distension. There is no tenderness.  Musculoskeletal: Normal range of motion. She exhibits no edema.  No arthritis, no gout  Lymphadenopathy:    She has no cervical adenopathy.  Neurological: She is alert and oriented to person, place, and time. No cranial nerve deficit.  No dysarthria, no aphasia  Skin: Skin is warm and dry. No rash noted. No erythema.  Psychiatric: She has a normal mood and affect. Her behavior is normal. Judgment and thought content normal.    LABORATORY PANEL:   CBC  Recent Labs Lab 12/05/15 1857  WBC 14.2*  HGB 13.4  HCT 42.1  PLT 316   ------------------------------------------------------------------------------------------------------------------  Chemistries   Recent Labs Lab 12/05/15 1857  NA 140  K 4.0  CL 105  CO2 24  GLUCOSE 111*  BUN 18  CREATININE 0.73  CALCIUM 9.1  MG 2.2  AST 24  ALT 19   ALKPHOS 81  BILITOT 0.3   ------------------------------------------------------------------------------------------------------------------  Cardiac Enzymes No results for input(s): TROPONINI in the last 168 hours. ------------------------------------------------------------------------------------------------------------------  RADIOLOGY:  Dg Chest Portable 1 View  12/05/2015  CLINICAL DATA:  Shortness of breath since yesterday. History of COPD. Initial encounter. EXAM: PORTABLE CHEST 1 VIEW COMPARISON:  PA and lateral chest 07/23/2013 and 12/26/2014. FINDINGS: The lungs are emphysematous with apical scar. No consolidative process, pneumothorax or effusion is identified. Heart size is normal. No focal bony abnormality. IMPRESSION: Emphysema without acute disease. Electronically Signed   By: Drusilla Kanner M.D.   On: 12/05/2015 19:52    EKG:   Orders placed or performed during the hospital encounter of 12/05/15  . ED EKG  . EKG 12-Lead  . EKG 12-Lead  . ED EKG    IMPRESSION AND PLAN:  Principal Problem:   Acute respiratory failure with hypoxia (HCC) - patient's oxygenation improved significantly with BiPAP. Her respiratory failure is likely due to COPD exacerbation and possible exacerbation of her BOOP. We'll admit her to the ICU for now with continued BiPAP support until able to wean. Treatment of other problems as below. Active Problems:   COPD exacerbation (HCC) - likely exacerbated by recent reinitiation of smoking as well as viral URI. Patient received IV magnesium, duo nebs, IV Solu-Medrol in the ED. She had good result in improvement in oxygenation with BiPAP. We will admit her BiPAP support, continue IV steroids and nebulizer treatments both scheduled and when necessary. Will also start IV levofloxacin given her elevated white blood cell count and recent change in the color of her sputum.   BOOP (bronchiolitis obliterans with organizing pneumonia) (HCC) - likely exacerbated  along with her COPD. We will utilize similar treatment approach, see above.   Hypertension - not on antihypertensives at home, however her blood pressure is elevated here. Some of this may be due to anxiety and her respiratory distress. We will treat that first and reassess her blood pressure. If still elevated will consider starting an antihypertensive medication.   Adjustment disorder with anxiety - home dose anxiolytics.  All the records are reviewed and case discussed with ED provider. Management plans discussed with the patient and/or family.  DVT PROPHYLAXIS: SubQ lovenox  GI PROPHYLAXIS: None  ADMISSION STATUS: Inpatient  CODE STATUS: Full Code Status History    This patient does not have a recorded code status. Please follow your organizational policy for patients in this situation.      TOTAL CRITICAL CARE TIME TAKING CARE OF THIS PATIENT: 45 minutes.  Bricia Taher FIELDING 12/05/2015, 9:00 PM  [facility]  Office  [phone]  CC: Primary care physician; Caitlyn Downs, MD

## 2015-12-05 NOTE — ED Provider Notes (Signed)
[facility] Emergency Department Provider Note  ____________________________________________  Time seen: Approximately 6:32 PM  I have reviewed the triage vital signs and the nursing notes.   HISTORY  Chief Complaint Shortness of Breath    HPI Caitlyn Evans is a 50 y.o. female with a history of severe COPD who his been intubated at least 4 times in the past, the last time about 4 years ago, who presents with approximately 2 days of gradual onset worsening shortness of breath that is now severe.  She reports that it started with someupper respiratory symptoms such as a worsening of her chronic cough, congestion, and a sore throat.  Over the last 2 days is gotten worse and she is having severe respiratory distress and increased work of breathing and wheezing in spite of using all of her regular medications and multiple nebulizer treatments.  She denies fever/chills, chest pain, abdominal pain.  She has had some nausea but no vomiting.  It feels similar to her prior COPD exacerbations.   Past Medical History  Diagnosis Date  . Migraines   . Hypercholesterolemia   . Seasonal allergies   . Emphysema lung (HCC) 05/30/2015  . Dysphagia, pharyngoesophageal phase 05/30/2015  . Lung mass   . Shortness of breath dyspnea     1 flight-stairs  . Anxiety     panic attacks  . Motion sickness     all moving vehicles  . Myocardial infarction (HCC) 2012  . BOOP (bronchiolitis obliterans with organizing pneumonia) [facility])     Patient Active Problem List   Diagnosis Date Noted  . Acute respiratory failure with hypoxia (HCC) 12/05/2015  . BOOP (bronchiolitis obliterans with organizing pneumonia) (HCC) 12/05/2015  . Depression, major, single episode, moderate (HCC) 07/14/2015  . Adjustment disorder with anxiety 07/14/2015  . COPD exacerbation (HCC) 07/14/2015  . Special screening for malignant neoplasms, colon   . Swallowing difficulty   . Loss of weight   .  Esophagogastric ulcer   . Esophageal ulcer   . Hypertension 05/30/2015  . Emphysema lung (HCC) 05/30/2015  . Dysphagia, pharyngoesophageal phase 05/30/2015    Past Surgical History  Procedure Laterality Date  . Vaginal hysterectomy    . Lung surgery Left     Upper lobe removed  . Oophorectomy Left   . Colonoscopy with propofol N/A 06/02/2015    Procedure: COLONOSCOPY WITH PROPOFOL;  Surgeon: Midge Minium, MD;  Location: [facility];  Service: Endoscopy;  Laterality: N/A;  . Esophagogastroduodenoscopy (egd) with propofol N/A 06/02/2015    Procedure: ESOPHAGOGASTRODUODENOSCOPY (EGD) WITH PROPOFOL withdialation;  Surgeon: Midge Minium, MD;  Location: [facility];  Service: Endoscopy;  Laterality: N/A;    Current Outpatient Rx  Name  Route  Sig  Dispense  Refill  . ALPRAZolam (XANAX) 0.25 MG tablet   Oral   Take 0.25 mg by mouth 2 (two) times daily.         Marland Kitchen doxycycline (VIBRA-TABS) 100 MG tablet   Oral   Take 100 mg by mouth daily.         Marland Kitchen HYDROcodone-acetaminophen (NORCO) 10-325 MG tablet   Oral   Take 0.5 tablets by mouth 2 (two) times daily.         Marland Kitchen ipratropium-albuterol (DUONEB) 0.5-2.5 (3) MG/3ML SOLN   Inhalation   Inhale 3 mLs into the lungs every 4 (four) hours as needed (for wheezing/shortness of breath).          . predniSONE (DELTASONE) 20 MG tablet  Oral   Take 20 mg by mouth daily with breakfast.         . zolpidem (AMBIEN) 10 MG tablet   Oral   Take 10 mg by mouth at bedtime.         Marland Kitchen doxycycline (VIBRA-TABS) 100 MG tablet   Oral   Take 1 tablet (100 mg total) by mouth 2 (two) times daily. Patient not taking: Reported on 12/05/2015   14 tablet   0   . predniSONE (DELTASONE) 20 MG tablet   Oral   Take 3 tablets (60 mg total) by mouth daily with breakfast. Patient not taking: Reported on 12/05/2015   15 tablet   0     Allergies Penicillins and Codeine  Family History  Problem Relation Age of Onset  . COPD Mother    . Heart attack Mother   . Stroke Father   . COPD Father     Social History Social History  Substance Use Topics  . Smoking status: Former Smoker -- 1.00 packs/day for 30 years  . Smokeless tobacco: Never Used     Comment: quit some time in 2012  . Alcohol Use: No     Comment: rare consumption    Review of Systems Constitutional: No fever/chills Eyes: No visual changes. ENT: sore throat. Cardiovascular: Denies chest pain. Respiratory: Severe shortness of breath, wheezing, cough Gastrointestinal: No abdominal pain.  nausea, no vomiting.  No diarrhea.  No constipation. Genitourinary: Negative for dysuria. Musculoskeletal: Negative for back pain. Skin: Negative for rash. Neurological: +headache, no focal weakness or numbness.  10-point ROS otherwise negative.  ____________________________________________   PHYSICAL EXAM:  VITAL SIGNS: ED Triage Vitals  Enc Vitals Group     BP 12/05/15 1814 132/97 mmHg     Pulse Rate 12/05/15 1814 104     Resp 12/05/15 1814 24     Temp 12/05/15 1814 98 F (36.7 C)     Temp Source 12/05/15 1814 Oral     SpO2 12/05/15 1814 97 %     Weight 12/05/15 1814 115 lb (52.164 kg)     Height 12/05/15 1814 4\' 11"  (1.499 m)     Head Cir --      Peak Flow --      Pain Score 12/05/15 1821 0     Pain Loc --      Pain Edu? --      Excl. in GC? --     Constitutional: Alert and oriented but in severe respiratory distress with increased work of breathing and retractions Eyes: Conjunctivae are normal. PERRL. EOMI. Head: Atraumatic. Nose: No congestion/rhinnorhea. Mouth/Throat: Mucous membranes are dry. Neck: No stridor.  No meningeal signs.  No tenderness to palpation of anterior neck/larynx Cardiovascular: Tachycardia, regular rhythm. Good peripheral circulation. Grossly normal heart sounds.   Respiratory: Normal respiratory effort.  No retractions. Lungs CTAB. Gastrointestinal: Soft and nontender. No distention.  Musculoskeletal: No lower  extremity tenderness nor edema. No gross deformities of extremities. Neurologic:  Normal speech and language. No gross focal neurologic deficits are appreciated.  Skin:  Skin is warm, dry and intact. No rash noted. Psychiatric: Mood and affect are anxious.  ____________________________________________   LABS (all labs ordered are listed, but only abnormal results are displayed)  Labs Reviewed  BLOOD GAS, ARTERIAL - Abnormal; Notable for the following:    pH, Arterial 7.54 (*)    pCO2 arterial 31 (*)    pO2, Arterial 192 (*)    Acid-Base Excess 4.2 (*)  Allens test (pass/fail) POSITIVE (*)    All other components within normal limits  CBC WITH DIFFERENTIAL/PLATELET - Abnormal; Notable for the following:    WBC 14.2 (*)    MCHC 31.9 (*)    RDW 18.9 (*)    Neutro Abs 12.4 (*)    Lymphs Abs 0.7 (*)    Monocytes Absolute 1.1 (*)    All other components within normal limits  COMPREHENSIVE METABOLIC PANEL - Abnormal; Notable for the following:    Glucose, Bld 111 (*)    All other components within normal limits  MAGNESIUM   ____________________________________________  EKG  ED ECG REPORT I, Nyela Cortinas, the attending physician, personally viewed and interpreted this ECG.   Date: 12/05/2015  EKG Time: 18:23  Rate: 102  Rhythm: sinus tachycardia  Axis: Normal  Intervals:Short PR interval  ST&T Change: Non-specific ST segment / T-wave changes, but no evidence of acute ischemia.  Heavy artifact is present due to respiratory distress  ____________________________________________  RADIOLOGY   Dg Chest Portable 1 View  12/05/2015  CLINICAL DATA:  Shortness of breath since yesterday. History of COPD. Initial encounter. EXAM: PORTABLE CHEST 1 VIEW COMPARISON:  PA and lateral chest 07/23/2013 and 12/26/2014. FINDINGS: The lungs are emphysematous with apical scar. No consolidative process, pneumothorax or effusion is identified. Heart size is normal. No focal bony abnormality.  IMPRESSION: Emphysema without acute disease. Electronically Signed   By: Drusilla Kannerhomas  Dalessio M.D.   On: 12/05/2015 19:52    ____________________________________________   PROCEDURES  Procedure(s) performed: None  Critical Care performed: Yes, see critical care note(s)   CRITICAL CARE Performed by: Loleta RoseFORBACH, Kendalyn Cranfield   Total critical care time: 60 minutes  Critical care time was exclusive of separately billable procedures and treating other patients.  Critical care was necessary to treat or prevent imminent or life-threatening deterioration.  Critical care was time spent personally by me on the following activities: development of treatment plan with patient and/or surrogate as well as nursing, discussions with consultants, evaluation of patient's response to treatment, examination of patient, obtaining history from patient or surrogate, ordering and performing treatments and interventions, ordering and review of laboratory studies, ordering and review of radiographic studies, pulse oximetry and re-evaluation of patient's condition.  ____________________________________________   INITIAL IMPRESSION / ASSESSMENT AND PLAN / ED COURSE  Pertinent labs & imaging results that were available during my care of the patient were reviewed by me and considered in my medical decision making (see chart for details).  Severe COPD exacerbation - bipap, duonebs x 3 --> continuous albuterol, magnesium.  Standard labs.  Close observation and monitoring.  RSI equipment to room, but will avoid if possible.  ----------------------------------------- 6:51 PM on 12/05/2015 -----------------------------------------  ABG reassuring, patient still increased WOB, anxious, giving Ativan 1 mg IV.  Continuing plan.   (Note that documentation was delayed due to multiple ED patients requiring immediate care.)   Multiple re-evaluations demonstrated slow but steady improvement.  I do not believe she would benefit  from intubation, and in fact I think she would be difficult to wean off of the ventilator.  I had several conversations with her about this and she agrees.  [facility]  I will admit her to the ICU for continued BiPAP and continuous nebulizer treatments.  ____________________________________________  FINAL CLINICAL IMPRESSION(S) / ED DIAGNOSES  Final diagnoses:  Acute respiratory failure, unspecified whether with hypoxia or hypercapnia (HCC)  Acute exacerbation of chronic obstructive pulmonary disease (COPD) (HCC)  Sore throat  Viral syndrome      NEW MEDICATIONS STARTED DURING THIS VISIT:  New Prescriptions   No medications on file      Note:  This document was prepared using Dragon voice recognition software and may include unintentional dictation errors.   Loleta Rose, MD 12/05/15 [phone]

## 2015-12-05 NOTE — Progress Notes (Signed)
Pharmacy Antibiotic Note  Caitlyn Evans is a 50 y.o. female admitted on 12/05/2015 with COPD exacerbation.  Pharmacy has been consulted for levofloxacin dosing.  Plan: Levofloxacin 500 mg IV daily   Height: 4\' 11"  (149.9 cm) Weight: 115 lb (52.164 kg) IBW/kg (Calculated) : 43.2  Temp (24hrs), Avg:98 F (36.7 C), Min:98 F (36.7 C), Max:98 F (36.7 C)   Recent Labs Lab 12/05/15 1857  WBC 14.2*  CREATININE 0.73    Estimated Creatinine Clearance: 60.8 mL/min (by C-G formula based on Cr of 0.73).    Allergies  Allergen Reactions  . Penicillins Hives and Other (See Comments)    Has patient had a PCN reaction causing immediate rash, facial/tongue/throat swelling, SOB or lightheadedness with hypotension: No Has patient had a PCN reaction causing severe rash involving mucus membranes or skin necrosis: No Has patient had a PCN reaction that required hospitalization No Has patient had a PCN reaction occurring within the last 10 years: No If all of the above answers are "NO", then may proceed with Cephalosporin use.  . Codeine Hives and Nausea And Vomiting    Antimicrobials this admission: levofloxacin 3/21 >>   Thank you for allowing pharmacy to be a part of this patient's care.  Cindi CarbonMary M Aariyah Sampey, PharmD Clinical Pharmacist 12/05/2015 10:10 PM

## 2015-12-05 NOTE — ED Notes (Signed)
Pt c/o SOB since yesterday with a hx of COPD. States she has been using home breathing Tx without relief.. Pt has audible wheezing in triage..Marland Kitchen

## 2015-12-06 DIAGNOSIS — J441 Chronic obstructive pulmonary disease with (acute) exacerbation: Secondary | ICD-10-CM

## 2015-12-06 DIAGNOSIS — F4322 Adjustment disorder with anxiety: Secondary | ICD-10-CM

## 2015-12-06 DIAGNOSIS — J8489 Other specified interstitial pulmonary diseases: Secondary | ICD-10-CM

## 2015-12-06 DIAGNOSIS — J9601 Acute respiratory failure with hypoxia: Secondary | ICD-10-CM

## 2015-12-06 LAB — CREATININE, SERUM
CREATININE: 0.56 mg/dL (ref 0.44–1.00)
GFR calc Af Amer: >60 mL/min (ref >60)
GFR calc non Af Amer: >60 mL/min (ref >60)

## 2015-12-06 LAB — CBC
HCT: 36.7 % (ref 35.0–47.0)
HEMATOCRIT: 35.9 % (ref 35.0–47.0)
Hemoglobin: 11.6 g/dL — ABNORMAL LOW (ref 12.0–16.0)
Hemoglobin: 12 g/dL (ref 12.0–16.0)
MCH: 28.1 pg (ref 26.0–34.0)
MCH: 28.2 pg (ref 26.0–34.0)
MCHC: 32.4 g/dL (ref 32.0–36.0)
MCHC: 32.8 g/dL (ref 32.0–36.0)
MCV: 85.8 fL (ref 80.0–100.0)
MCV: 86.7 fL (ref 80.0–100.0)
PLATELETS: 273 10*3/uL (ref 150–440)
Platelets: 269 10*3/uL (ref 150–440)
RBC: 4.14 MIL/uL (ref 3.80–5.20)
RBC: 4.27 MIL/uL (ref 3.80–5.20)
RDW: 18.9 % — AB (ref 11.5–14.5)
RDW: 19.1 % — AB (ref 11.5–14.5)
WBC: 11.9 10*3/uL — ABNORMAL HIGH (ref 3.6–11.0)
WBC: 14.8 10*3/uL — ABNORMAL HIGH (ref 3.6–11.0)

## 2015-12-06 LAB — BASIC METABOLIC PANEL
Anion gap: 6 (ref 5–15)
BUN: 19 mg/dL (ref 6–20)
CHLORIDE: 108 mmol/L (ref 101–111)
CO2: 26 mmol/L (ref 22–32)
CREATININE: 0.64 mg/dL (ref 0.44–1.00)
Calcium: 8.1 mg/dL — ABNORMAL LOW (ref 8.9–10.3)
GFR calc Af Amer: >60 mL/min (ref >60)
GFR calc non Af Amer: >60 mL/min (ref >60)
GLUCOSE: 176 mg/dL — AB (ref 65–99)
POTASSIUM: 4.1 mmol/L (ref 3.5–5.1)
Sodium: 140 mmol/L (ref 135–145)

## 2015-12-06 LAB — GLUCOSE, CAPILLARY
GLUCOSE-CAPILLARY: 105 mg/dL — AB (ref 65–99)
Glucose-Capillary: 117 mg/dL — ABNORMAL HIGH (ref 65–99)

## 2015-12-06 LAB — MRSA PCR SCREENING: MRSA by PCR: NEGATIVE

## 2015-12-06 MED ORDER — LORAZEPAM 2 MG/ML IJ SOLN
INTRAMUSCULAR | Status: AC
  Administered 2015-12-06: 2 mg via INTRAVENOUS
  Filled 2015-12-06: qty 1

## 2015-12-06 MED ORDER — ALBUTEROL SULFATE (2.5 MG/3ML) 0.083% IN NEBU
2.5000 mg | INHALATION_SOLUTION | Freq: Four times a day (QID) | RESPIRATORY_TRACT | Status: DC
  Administered 2015-12-06 – 2015-12-07 (×3): 2.5 mg via RESPIRATORY_TRACT
  Filled 2015-12-06 (×3): qty 3

## 2015-12-06 MED ORDER — ASPIRIN EC 81 MG PO TBEC
81.0000 mg | DELAYED_RELEASE_TABLET | Freq: Every day | ORAL | Status: DC
  Administered 2015-12-07 – 2015-12-09 (×3): 81 mg via ORAL
  Filled 2015-12-06 (×3): qty 1

## 2015-12-06 MED ORDER — ALPRAZOLAM 0.25 MG PO TABS
0.2500 mg | ORAL_TABLET | Freq: Three times a day (TID) | ORAL | Status: DC
  Administered 2015-12-06 – 2015-12-09 (×9): 0.25 mg via ORAL
  Filled 2015-12-06 (×9): qty 1

## 2015-12-06 MED ORDER — DEXMEDETOMIDINE HCL IN NACL 400 MCG/100ML IV SOLN
0.0000 ug/kg/h | INTRAVENOUS | Status: DC
  Administered 2015-12-06: 0.4 ug/kg/h via INTRAVENOUS
  Administered 2015-12-06: 0.3 ug/kg/h via INTRAVENOUS
  Administered 2015-12-07: 0.5 ug/kg/h via INTRAVENOUS
  Filled 2015-12-06: qty 100
  Filled 2015-12-06: qty 50
  Filled 2015-12-06 (×3): qty 100

## 2015-12-06 MED ORDER — METHYLPREDNISOLONE SODIUM SUCC 125 MG IJ SOLR
60.0000 mg | Freq: Three times a day (TID) | INTRAMUSCULAR | Status: DC
  Administered 2015-12-06 – 2015-12-07 (×2): 60 mg via INTRAVENOUS
  Filled 2015-12-06 (×2): qty 2

## 2015-12-06 MED ORDER — ALPRAZOLAM 0.25 MG PO TABS: 0.2500 mg | ORAL_TABLET | Freq: Two times a day (BID) | ORAL | Status: DC

## 2015-12-06 MED ORDER — LORAZEPAM 2 MG/ML IJ SOLN
2.0000 mg | Freq: Once | INTRAMUSCULAR | Status: AC
  Administered 2015-12-06: 2 mg via INTRAVENOUS

## 2015-12-06 MED ORDER — INSULIN ASPART 100 UNIT/ML ~~LOC~~ SOLN: 0.0000 [IU] | SUBCUTANEOUS | Status: DC

## 2015-12-06 MED ORDER — FAMOTIDINE 20 MG PO TABS
20.0000 mg | ORAL_TABLET | Freq: Two times a day (BID) | ORAL | Status: DC
  Administered 2015-12-06 – 2015-12-09 (×6): 20 mg via ORAL
  Filled 2015-12-06 (×6): qty 1

## 2015-12-06 NOTE — Care Management (Signed)
Patient presents from home with increasing shortness of breath.  Admitted to icu on continuous  bipap.  She has history of previous intubations.

## 2015-12-06 NOTE — Progress Notes (Signed)
eLink Physician-Brief Progress Note Patient Name: Caitlyn Evans DOB: 1966-04-13 MRN: [id number]   Date of Service  12/06/2015  HPI/Events of Note  50 yo female with PMH of COPD, Tobacco Abuse and HTN.  Presents with COPD exacerbation. Medical regimen includes BiPAP, Lovenox Harris, Xopenex Nebs Q 6 hours, Levaquin, Solumedrol, Tylenol PRN, Duonebs PRN and Morphine PRN. ABG = 7.54/31/192 and RR = 19. BP = 111/67 and HR = 92. Management per Hospitalist Service.   eICU Interventions  Continue present management.      Intervention Category Evaluation Type: New Patient Evaluation  Caitlyn Evans,Caitlyn Evans 12/06/2015, 12:06 AM

## 2015-12-06 NOTE — Progress Notes (Signed)
Chaplain rounded the unit and provided a compassionate presence and support to the patient. Chaplain Divine Hansley [phone] 

## 2015-12-06 NOTE — Progress Notes (Addendum)
Pt was observed removing the Bipap mask to drink tea provided by her spouse from home. Pt and spouse educated on importance of not removing the mask and educated on the risk of drinking fluids while on the Bipap.   Pt now complaining of lungs burning when she is breathing.Dr.Vachhani notified. No new orders at this time.   Will continue to assess.

## 2015-12-06 NOTE — Progress Notes (Signed)
Patient seen by CCM, this morning, full consult note to follow.  Pt noted to be extremely dyspneic this morning, tripoding, respiratory rate of 35-40. She appeared to have poor air entry bilaterally with bilateral wheezing. Discussed with the patient's son, she often has panic attacks. She was given 2 mg of Ativan IV push and was showing some breathing techniques to try to calm her breathing down. This appeared to help with her breathing. We also encouraged her to keep her BiPAP on, which is also improved. Subsequently, the patient is started on IV Precedex to help with her agitation.  Caitlyn Evans, M.D.  12/06/2015

## 2015-12-06 NOTE — Progress Notes (Signed)
Pt was able to manage anxiety with Precedex, and scheduled Ativan. Pt was off Bipap on Room air for about 1.5 Hrs. Pt told nursing staff she is caring for her elderly father with dementia and her three grandchildren in the home. This causes stress in her life, and the patient believes that may have led to COPD exacerbation.  Pt ate supper. No complaints of pain on my shift and is currently on Bipap resting. Report given to Del, on coming R.N.

## 2015-12-06 NOTE — Consult Note (Signed)
[facility] Consultation     ASSESSMENT/PLAN    PULMONARY  A: Severe emphysema with acute exacerbation, likely due to acute bronchitis. Respiratory distress. P:   -Continue IV steroids for now, we'll try to limit the dose as this may be contributing to her anxiety and dyspnea. Continue duo nebs. Continue antibiotics for possible bronchitis. -Check flu swab.  NEUROLOGIC A:  Severe anxiety, which is likely contributing to her dyspnea. P:   -It is difficult to determine how much of her dyspnea is from COPD versus anxiety. Given her minimal requirements on BiPAP, I suspect that dyspnea may be a major driver. -We'll treat with scheduled alprazolam orally. Have also started the patient on Precedex to be used to help with her breathing.   CARDIOVASCULAR  A: History of coronary artery disease, myocardial infarction. P:  Aspirin 81 mg. Enoxaparin for DVT prophylaxis.  RENAL A:  Stable.  GASTROINTESTINAL A:  On famotidine for GI prophylaxis.  HEMATOLOGIC A:  --  INFECTIOUS A:  Acute bronchitis. P:  Levaquin 500 mg 5 days. MRSA screen. 3/21: Negative.  ENDOCRINE A:  Will monitor blood glucose levels while on steroids.        ---------------------------------------  ---------------------------------------   Name: Caitlyn Evans MRN: [id number] DOB: November 07, 1965    ADMISSION DATE:  12/05/2015 CONSULTATION DATE:  12/06/15.  REFERRING MD : Dr. Elisabeth Pigeon  CHIEF COMPLAINT:  Dyspnea.    HISTORY OF PRESENT ILLNESS:    Patient is seen as per ER presentation as indicated below. At the time of my evaluation, the patient is very dyspneic on BiPAP. Therefore, all history was obtained via chart and from the patient's husband who was at the bedside. The patient tells me that she has a history of COPD, apparently she has a history of multiple intubations in the past. She was previously seeing a pulmonologist, Dr. Park Breed. However, she is no longer seeing  him and instead followed with her primary care doctor. Patient's husband tells me that she has had a history of bronchiolitis obliterans-organizing pneumonia She has a history of myocardial infarction, status post cardiac catheterization. She had a left upper lobe wedge resection of a benign lung nodule at [facility] in May 2012. My initial evaluation, the patient appeared to be extremely dyspneic with a respiratory rate of approximately 40. It appeared that she was predominantly anxious, more than having respiratory problems, her husband says that she does this frequently. We spoke with her and had her try to calm down, she was given Ativan 2 mg IV push with improvement in her breathing, she was subsequent restarted on Precedex. Despite her severe dyspnea, she appears to be compensated on her arterial blood gas, which does not show any evidence of hypercapnia or imminent respiratory failure. In addition, she is requiring a BiPAP of only 10 over 5, with an FiO2 of 30% to keep her options saturation at 100%. This appears to suggest that her severe dyspnea may be severely exacerbated by anxiety.  Review of chest x-ray images show severe emphysema, in comparison with old films. I do not see any acute changes. CT chest report from January 2009 is reviewed. (Images are not available), consistent with severe emphysema, pleural parenchymal scarring.  Per ER:  Caitlyn Evans is a 50 y.o. female with a history of severe COPD who his been intubated at least 4 times in the past, the last time about 4 years ago, who presents with approximately 2 days of gradual onset worsening shortness of  breath that is now severe. She reports that it started with someupper respiratory symptoms such as a worsening of her chronic cough, congestion, and a sore throat. Over the last 2 days is gotten worse and she is having severe respiratory distress and increased work of breathing and wheezing in spite of using all of her regular  medications and multiple nebulizer treatments. She denies fever/chills, chest pain, abdominal pain. She has had some nausea but no vomiting. It feels similar to her prior COPD exacerbations.  PAST MEDICAL HISTORY :  Past Medical History  Diagnosis Date  . Migraines   . Hypercholesterolemia   . Seasonal allergies   . Emphysema lung (HCC) 05/30/2015  . Dysphagia, pharyngoesophageal phase 05/30/2015  . Lung mass   . Shortness of breath dyspnea     1 flight-stairs  . Anxiety     panic attacks  . Motion sickness     all moving vehicles  . Myocardial infarction (HCC) 2012  . BOOP (bronchiolitis obliterans with organizing pneumonia) [facility])    Past Surgical History  Procedure Laterality Date  . Vaginal hysterectomy    . Lung surgery Left     Upper lobe removed  . Oophorectomy Left   . Colonoscopy with propofol N/A 06/02/2015    Procedure: COLONOSCOPY WITH PROPOFOL;  Surgeon: Midge Minium, MD;  Location: [facility];  Service: Endoscopy;  Laterality: N/A;  . Esophagogastroduodenoscopy (egd) with propofol N/A 06/02/2015    Procedure: ESOPHAGOGASTRODUODENOSCOPY (EGD) WITH PROPOFOL withdialation;  Surgeon: Midge Minium, MD;  Location: [facility];  Service: Endoscopy;  Laterality: N/A;   Prior to Admission medications   Medication Sig Start Date End Date Taking? Authorizing Provider  ALPRAZolam (XANAX) 0.25 MG tablet Take 0.25 mg by mouth 2 (two) times daily.   Yes Historical Provider, MD  doxycycline (VIBRA-TABS) 100 MG tablet Take 100 mg by mouth daily.   Yes Historical Provider, MD  HYDROcodone-acetaminophen (NORCO) 10-325 MG tablet Take 0.5 tablets by mouth 2 (two) times daily.   Yes Historical Provider, MD  ipratropium-albuterol (DUONEB) 0.5-2.5 (3) MG/3ML SOLN Inhale 3 mLs into the lungs every 4 (four) hours as needed (for wheezing/shortness of breath).    Yes Historical Provider, MD  predniSONE (DELTASONE) 20 MG tablet Take 20 mg by mouth daily with breakfast.   Yes  Historical Provider, MD  zolpidem (AMBIEN) 10 MG tablet Take 10 mg by mouth at bedtime.   Yes Historical Provider, MD  doxycycline (VIBRA-TABS) 100 MG tablet Take 1 tablet (100 mg total) by mouth 2 (two) times daily. Patient not taking: Reported on 12/05/2015 07/14/15   Myrna Blazer, MD  predniSONE (DELTASONE) 20 MG tablet Take 3 tablets (60 mg total) by mouth daily with breakfast. Patient not taking: Reported on 12/05/2015 07/14/15   Myrna Blazer, MD   Allergies  Allergen Reactions  . Penicillins Hives and Other (See Comments)    Has patient had a PCN reaction causing immediate rash, facial/tongue/throat swelling, SOB or lightheadedness with hypotension: No Has patient had a PCN reaction causing severe rash involving mucus membranes or skin necrosis: No Has patient had a PCN reaction that required hospitalization No Has patient had a PCN reaction occurring within the last 10 years: No If all of the above answers are "NO", then may proceed with Cephalosporin use.  . Codeine Hives and Nausea And Vomiting    FAMILY HISTORY:  Family History  Problem Relation Age of Onset  . COPD Mother   . Heart attack Mother   .  Stroke Father   . COPD Father    SOCIAL HISTORY:  reports that she has quit smoking. She has never used smokeless tobacco. She reports that she does not drink alcohol or use illicit drugs.  REVIEW OF SYSTEMS:   Constitutional: Feels well. Cardiovascular: No chest pain.  Pulmonary: Denies dyspnea.   The remainder of systems were reviewed and were found to be negative other than what is documented in the HPI.    VITAL SIGNS: Temp:  [98 F (36.7 C)-98.1 F (36.7 C)] 98.1 F (36.7 C) (03/22 0800) Pulse Rate:  [72-117] 85 (03/22 1300) Resp:  [14-33] 17 (03/22 1300) BP: (91-153)/(47-106) 99/72 mmHg (03/22 1300) SpO2:  [97 %-100 %] 100 % (03/22 1300) FiO2 (%):  [35 %-40 %] 35 % (03/22 1258) Weight:  [115 lb (52.164 kg)] 115 lb (52.164 kg) (03/21  1814) HEMODYNAMICS:   VENTILATOR SETTINGS: Vent Mode:  [-]  FiO2 (%):  [35 %-40 %] 35 % INTAKE / OUTPUT:  Intake/Output Summary (Last 24 hours) at 12/06/15 1356 Last data filed at 12/06/15 1000  Gross per 24 hour  Intake  11.19 ml  Output    450 ml  Net -438.81 ml    Physical Examination:   VS: BP 99/72 mmHg  Pulse 85  Temp(Src) 98.1 F (36.7 C) (Axillary)  Resp 17  Ht 4\' 11"  (1.499 m)  Wt 115 lb (52.164 kg)  BMI 23.21 kg/m2  SpO2 100%  General Appearance: No distress  Neuro:without focal findings, mental status, speech normal, alert and oriented, cranial nerves 2-12 intact, reflexes normal and symmetric, sensation grossly normal  HEENT: PERRLA, EOM intact, no ptosis, no other lesions noticed;  Pulmonary: normal breath sounds., diaphragmatic excursion normal.No wheezing, No rales;     CardiovascularNormal S1,S2.  No m/r/g.    Abdomen: Benign, Soft, non-tender, No masses, hepatosplenomegaly, No lymphadenopathy Renal:  No costovertebral tenderness  GU:  Not performed at this time. Endoc: No evident thyromegaly, no signs of acromegaly. Skin:   warm, no rashes, no ecchymosis  Extremities: normal, no cyanosis, clubbing, no edema, warm with normal capillary refill.    LABS: Reviewed   LABORATORY PANEL:   CBC  Recent Labs Lab 12/06/15 0431  WBC 11.9*  HGB 11.6*  HCT 35.9  PLT 269    Chemistries   Recent Labs Lab 12/05/15 1857  12/06/15 0431  NA 140  --  140  K 4.0  --  4.1  CL 105  --  108  CO2 24  --  26  GLUCOSE 111*  --  176*  BUN 18  --  19  CREATININE 0.73  < > 0.64  CALCIUM 9.1  --  8.1*  MG 2.2  --   --   AST 24  --   --   ALT 19  --   --   ALKPHOS 81  --   --   BILITOT 0.3  --   --   < > = values in this interval not displayed.  No results for input(s): GLUCAP in the last 168 hours.  Recent Labs Lab 12/05/15 1837  PHART 7.54*  PCO2ART 31*  PO2ART 192*    Recent Labs Lab 12/05/15 1857  AST 24  ALT 19  ALKPHOS 81  BILITOT  0.3  ALBUMIN 4.2    Cardiac Enzymes No results for input(s): TROPONINI in the last 168 hours.  RADIOLOGY:  Dg Chest Portable 1 View  12/05/2015  CLINICAL DATA:  Shortness of breath since yesterday. History of  COPD. Initial encounter. EXAM: PORTABLE CHEST 1 VIEW COMPARISON:  PA and lateral chest 07/23/2013 and 12/26/2014. FINDINGS: The lungs are emphysematous with apical scar. No consolidative process, pneumothorax or effusion is identified. Heart size is normal. No focal bony abnormality. IMPRESSION: Emphysema without acute disease. Electronically Signed   By: Drusilla Kannerhomas  Dalessio M.D.   On: 12/05/2015 19:52       --Wells Guileseep Eleora Sutherland, MD.  Board Certified in Internal Medicine, Pulmonary Medicine, Critical Care Medicine, and Sleep Medicine.  [facility]  Santiago Gladavid Kasa, M.D.  Stephanie AcreVishal Mungal, M.D.  Billy Fischeravid Simonds, M.D   12/06/2015, 1:56 PM

## 2015-12-06 NOTE — Care Management (Signed)
Patient is struggling to breathe on bipap.  Will make attempt at later time to assess.

## 2015-12-06 NOTE — Progress Notes (Signed)
Pharmacy Antibiotic Note  Caitlyn Evans is a 50 y.o. female admitted on 12/05/2015 with COPD exacerbation.  Pharmacy has been consulted for levofloxacin dosing.  Plan: Continue levofloxacin 500 mg IV daily   Height: 4\' 11"  (149.9 cm) Weight: 115 lb (52.164 kg) IBW/kg (Calculated) : 43.2  Temp (24hrs), Avg:98.1 F (36.7 C), Min:98 F (36.7 C), Max:98.1 F (36.7 C)   Recent Labs Lab 12/05/15 1857 12/05/15 2356 12/06/15 0431  WBC 14.2* 14.8* 11.9*  CREATININE 0.73 0.56 0.64    Estimated Creatinine Clearance: 60.8 mL/min (by C-G formula based on Cr of 0.64).    Allergies  Allergen Reactions  . Penicillins Hives and Other (See Comments)    Has patient had a PCN reaction causing immediate rash, facial/tongue/throat swelling, SOB or lightheadedness with hypotension: No Has patient had a PCN reaction causing severe rash involving mucus membranes or skin necrosis: No Has patient had a PCN reaction that required hospitalization No Has patient had a PCN reaction occurring within the last 10 years: No If all of the above answers are "NO", then may proceed with Cephalosporin use.  . Codeine Hives and Nausea And Vomiting    Antimicrobials this admission: levofloxacin 3/21 >>   Microbiology:  3/21 MRSA PCR: negative  Thank you for allowing pharmacy to be a part of this patient's care.  Luisa Harthristy, Barrett Goldie D, PharmD Clinical Pharmacist 12/06/2015 1:46 PM

## 2015-12-06 NOTE — Progress Notes (Signed)
Dr. Elisabeth PigeonVachhani spoke with pt about removing the mask. Gave verbal orders to switch to IV ativan instead of oral. Pt to remain NPO.  Will continue to assess.

## 2015-12-07 LAB — GLUCOSE, CAPILLARY
GLUCOSE-CAPILLARY: 104 mg/dL — AB (ref 65–99)
GLUCOSE-CAPILLARY: 117 mg/dL — AB (ref 65–99)
GLUCOSE-CAPILLARY: 118 mg/dL — AB (ref 65–99)
GLUCOSE-CAPILLARY: 101 mg/dL — AB (ref 65–99)
Glucose-Capillary: 119 mg/dL — ABNORMAL HIGH (ref 65–99)
Glucose-Capillary: 112 mg/dL — ABNORMAL HIGH (ref 65–99)

## 2015-12-07 LAB — CBC
HCT: 37.1 % (ref 35.0–47.0)
Hemoglobin: 11.8 g/dL — ABNORMAL LOW (ref 12.0–16.0)
MCH: 28.2 pg (ref 26.0–34.0)
MCHC: 31.7 g/dL — AB (ref 32.0–36.0)
MCV: 88.8 fL (ref 80.0–100.0)
PLATELETS: 266 10*3/uL (ref 150–440)
RBC: 4.18 MIL/uL (ref 3.80–5.20)
RDW: 18.6 % — ABNORMAL HIGH (ref 11.5–14.5)
WBC: 13.7 10*3/uL — ABNORMAL HIGH (ref 3.6–11.0)

## 2015-12-07 LAB — BASIC METABOLIC PANEL
Anion gap: 4 — ABNORMAL LOW (ref 5–15)
BUN: 20 mg/dL (ref 6–20)
CHLORIDE: 105 mmol/L (ref 101–111)
CO2: 29 mmol/L (ref 22–32)
CREATININE: 0.58 mg/dL (ref 0.44–1.00)
Calcium: 8.5 mg/dL — ABNORMAL LOW (ref 8.9–10.3)
GFR calc Af Amer: >60 mL/min (ref >60)
GLUCOSE: 129 mg/dL — AB (ref 65–99)
Potassium: 4.4 mmol/L (ref 3.5–5.1)
SODIUM: 138 mmol/L (ref 135–145)

## 2015-12-07 MED ORDER — METHYLPREDNISOLONE SODIUM SUCC 125 MG IJ SOLR: 60.0000 mg | Freq: Two times a day (BID) | INTRAMUSCULAR | Status: DC

## 2015-12-07 MED ORDER — LEVOFLOXACIN IN D5W 500 MG/100ML IV SOLN
500.0000 mg | INTRAVENOUS | Status: DC
  Filled 2015-12-07: qty 100

## 2015-12-07 MED ORDER — METHYLPREDNISOLONE SODIUM SUCC 125 MG IJ SOLR
60.0000 mg | Freq: Two times a day (BID) | INTRAMUSCULAR | Status: DC
  Administered 2015-12-07 – 2015-12-08 (×2): 60 mg via INTRAVENOUS
  Filled 2015-12-07 (×2): qty 2

## 2015-12-07 MED ORDER — IPRATROPIUM-ALBUTEROL 0.5-2.5 (3) MG/3ML IN SOLN
3.0000 mL | Freq: Four times a day (QID) | RESPIRATORY_TRACT | Status: DC
  Administered 2015-12-07 – 2015-12-09 (×7): 3 mL via RESPIRATORY_TRACT
  Filled 2015-12-07 (×8): qty 3

## 2015-12-07 NOTE — Care Management (Signed)
Increased precedex drip due to agitation and anxiety.  Moved to stepdown level of care. Was not able to tolerate HFNC for more than 15 minutes and had to be placed back on bipapp

## 2015-12-07 NOTE — Progress Notes (Signed)
Patient alert and oriented, vital signs stable, afebrile.  Patient gets anxious easily but able to tolerate off bipap today, currently on 4L [place], sats in upper 90s to 100%.  Anxiety controlled by precedex gtt and scheduled xanax. Patient received prn tylenol for pain x1 for headache it improvement.  Patient voiding via bedpan with adequate urine output.  Currently resting in no apparent distress.

## 2015-12-07 NOTE — Progress Notes (Signed)
[facility] [place] [facility]   PATIENT NAME: Caitlyn Evans    MR#:  [id number]  DATE OF BIRTH:  1965/11/11  SUBJECTIVE:  CHIEF COMPLAINT:   Chief Complaint  Patient presents with  . Shortness of Breath     Came with severe respi distress, required BIpap,also on precedex drip for anxiety.    Now on nasal canula oxygen.  REVIEW OF SYSTEMS:  CONSTITUTIONAL: No fever, fatigue or weakness.  EYES: No blurred or double vision.  EARS, NOSE, AND THROAT: No tinnitus or ear pain.  RESPIRATORY: No cough,Positive shortness of breath, wheezing , no hemoptysis.  CARDIOVASCULAR: No chest pain, orthopnea, edema.  GASTROINTESTINAL: No nausea, vomiting, diarrhea or abdominal pain.  GENITOURINARY: No dysuria, hematuria.  ENDOCRINE: No polyuria, nocturia,  HEMATOLOGY: No anemia, easy bruising or bleeding SKIN: No rash or lesion. MUSCULOSKELETAL: No joint pain or arthritis.   NEUROLOGIC: No tingling, numbness, weakness.  PSYCHIATRY: No anxiety or depression.   ROS  DRUG ALLERGIES:   Allergies  Allergen Reactions  . Penicillins Hives and Other (See Comments)    Has patient had a PCN reaction causing immediate rash, facial/tongue/throat swelling, SOB or lightheadedness with hypotension: No Has patient had a PCN reaction causing severe rash involving mucus membranes or skin necrosis: No Has patient had a PCN reaction that required hospitalization No Has patient had a PCN reaction occurring within the last 10 years: No If all of the above answers are "NO", then may proceed with Cephalosporin use.  . Codeine Hives and Nausea And Vomiting    VITALS:  Blood pressure 110/70, pulse 89, temperature 97.6 F (36.4 C), temperature source Oral, resp. rate 27, height  (1.499 m), weight 52.164 kg (115 lb), SpO2 100 %.  PHYSICAL EXAMINATION:  GENERAL:  50 y.o.-year-old patient lying in the bed with no acute distress.  EYES: Pupils equal, round, reactive to light and  accommodation. No scleral icterus. Extraocular muscles intact.  HEENT: Head atraumatic, normocephalic. Oropharynx and nasopharynx clear.  NECK:  Supple, no jugular venous distention. No thyroid enlargement, no tenderness.  LUNGS: Normal breath sounds bilaterally, positive wheezing, no crepitation. No use of accessory muscles of respiration. On nasal canula. CARDIOVASCULAR: S1, S2 normal. No murmurs, rubs, or gallops.  ABDOMEN: Soft, nontender, nondistended. Bowel sounds present. No organomegaly or mass.  EXTREMITIES: No pedal edema, cyanosis, or clubbing.  NEUROLOGIC: Cranial nerves II through XII are intact. Muscle strength 5/5 in all extremities. Sensation intact. Gait not checked.  PSYCHIATRIC: The patient is alert and oriented x 3.  SKIN: No obvious rash, lesion, or ulcer.   Physical Exam LABORATORY PANEL:   CBC  Recent Labs Lab 12/07/15 0351  WBC 13.7*  HGB 11.8*  HCT 37.1  PLT 266   ------------------------------------------------------------------------------------------------------------------  Chemistries   Recent Labs Lab 12/05/15 1857  12/07/15 0351  NA 140  < > 138  K 4.0  < > 4.4  CL 105  < > 105  CO2 24  < > 29  GLUCOSE 111*  < > 129*  BUN 18  < > 20  CREATININE 0.73  < > 0.58  CALCIUM 9.1  < > 8.5*  MG 2.2  --   --   AST 24  --   --   ALT 19  --   --   ALKPHOS 81  --   --   BILITOT 0.3  --   --   < > = values in this interval not displayed. ------------------------------------------------------------------------------------------------------------------  Cardiac  Enzymes No results for input(s): TROPONINI in the last 168 hours. ------------------------------------------------------------------------------------------------------------------  RADIOLOGY:  No results found.  ASSESSMENT AND PLAN:   Principal Problem:   Acute respiratory failure with hypoxia (HCC) Active Problems:   Hypertension   Adjustment disorder with anxiety   COPD  exacerbation (HCC)   BOOP (bronchiolitis obliterans with organizing pneumonia) (HCC)   * Acute respiratory failure with hypoxia (HCC) - patient's oxygenation improved significantly with BiPAP.   COPD exacerbation   weaned from Bipap to Nasal canula oxygen.  IV steroids, and precedex drip as per Pulmonary consult.  * COPD exacerbation (HCC) -  likely exacerbated by recent reinitiation of smoking as well as viral URI.   received IV magnesium, duo nebs, IV Solu-Medrol in the ED. continue IV steroids and nebulizer treatments ,d/c ed levofloxacin   May need home o2.  * Hypertension - not on antihypertensives at home, however her blood pressure is elevated here. Some of this may be due to anxiety and her respiratory distress.   * Adjustment disorder with anxiety - home dose anxiolytics.   All the records are reviewed and case discussed with Care Management/Social Workerr. Management plans discussed with the patient, family and they are in agreement.  CODE STATUS: Full  TOTAL TIME TAKING CARE OF THIS PATIENT: 35 minutes.   POSSIBLE D/C IN 2-3 DAYS, DEPENDING ON CLINICAL CONDITION.   Altamese DillingVACHHANI, Donatello Kleve M.D on 12/07/2015   Between 7am to 6pm - Pager - [phone]  After 6pm go to [email or web address] - password [email or web address]  [facility]  Office  [phone]  CC: Primary care physician; Corky DownsMASOUD,JAVED, MD  Note: This dictation was prepared with Dragon dictation along with smaller phrase technology. Any transcriptional errors that result from this process are unintentional.

## 2015-12-07 NOTE — Progress Notes (Signed)
[facility] [place] [facility]   PATIENT NAME: Chrissa Meetze    MR#:  [id number]  DATE OF BIRTH:  04-Nov-1965  SUBJECTIVE:  CHIEF COMPLAINT:   Chief Complaint  Patient presents with  . Shortness of Breath     Requiring BiPAP, had episode of severe respiratory distress in the morning with anxiety, but after recommendations of pulmonary given injection Ativan and started on Precedex drip. And during my exam around 2:00 PM she appears more comfortable using BiPAP.  REVIEW OF SYSTEMS:  CONSTITUTIONAL: No fever, fatigue or weakness.  EYES: No blurred or double vision.  EARS, NOSE, AND THROAT: No tinnitus or ear pain.  RESPIRATORY: No cough,Positive shortness of breath, wheezing , no hemoptysis.  CARDIOVASCULAR: No chest pain, orthopnea, edema.  GASTROINTESTINAL: No nausea, vomiting, diarrhea or abdominal pain.  GENITOURINARY: No dysuria, hematuria.  ENDOCRINE: No polyuria, nocturia,  HEMATOLOGY: No anemia, easy bruising or bleeding SKIN: No rash or lesion. MUSCULOSKELETAL: No joint pain or arthritis.   NEUROLOGIC: No tingling, numbness, weakness.  PSYCHIATRY: No anxiety or depression.   ROS  DRUG ALLERGIES:   Allergies  Allergen Reactions  . Penicillins Hives and Other (See Comments)    Has patient had a PCN reaction causing immediate rash, facial/tongue/throat swelling, SOB or lightheadedness with hypotension: No Has patient had a PCN reaction causing severe rash involving mucus membranes or skin necrosis: No Has patient had a PCN reaction that required hospitalization No Has patient had a PCN reaction occurring within the last 10 years: No If all of the above answers are "NO", then may proceed with Cephalosporin use.  . Codeine Hives and Nausea And Vomiting    VITALS:  Blood pressure 99/60, pulse 53, temperature 96.7 F (35.9 C), temperature source Axillary, resp. rate 17, height  (1.499 m), weight 52.164 kg (115 lb), SpO2 100  %.  PHYSICAL EXAMINATION:  GENERAL:  50 y.o.-year-old patient lying in the bed with no acute distress.  EYES: Pupils equal, round, reactive to light and accommodation. No scleral icterus. Extraocular muscles intact.  HEENT: Head atraumatic, normocephalic. Oropharynx and nasopharynx clear.  NECK:  Supple, no jugular venous distention. No thyroid enlargement, no tenderness.  LUNGS: Normal breath sounds bilaterally, positive wheezing, no crepitation. Positive use of accessory muscles of respiration. On BiPAP. CARDIOVASCULAR: S1, S2 normal. No murmurs, rubs, or gallops.  ABDOMEN: Soft, nontender, nondistended. Bowel sounds present. No organomegaly or mass.  EXTREMITIES: No pedal edema, cyanosis, or clubbing.  NEUROLOGIC: Cranial nerves II through XII are intact. Muscle strength 5/5 in all extremities. Sensation intact. Gait not checked.  PSYCHIATRIC: The patient is alert and oriented x 3.  SKIN: No obvious rash, lesion, or ulcer.   Physical Exam LABORATORY PANEL:   CBC  Recent Labs Lab 12/07/15 0351  WBC 13.7*  HGB 11.8*  HCT 37.1  PLT 266   ------------------------------------------------------------------------------------------------------------------  Chemistries   Recent Labs Lab 12/05/15 1857  12/07/15 0351  NA 140  < > 138  K 4.0  < > 4.4  CL 105  < > 105  CO2 24  < > 29  GLUCOSE 111*  < > 129*  BUN 18  < > 20  CREATININE 0.73  < > 0.58  CALCIUM 9.1  < > 8.5*  MG 2.2  --   --   AST 24  --   --   ALT 19  --   --   ALKPHOS 81  --   --   BILITOT 0.3  --   --   < > =  values in this interval not displayed. ------------------------------------------------------------------------------------------------------------------  Cardiac Enzymes No results for input(s): TROPONINI in the last 168 hours. ------------------------------------------------------------------------------------------------------------------  RADIOLOGY:  Dg Chest Portable 1 View  12/05/2015   CLINICAL DATA:  Shortness of breath since yesterday. History of COPD. Initial encounter. EXAM: PORTABLE CHEST 1 VIEW COMPARISON:  PA and lateral chest 07/23/2013 and 12/26/2014. FINDINGS: The lungs are emphysematous with apical scar. No consolidative process, pneumothorax or effusion is identified. Heart size is normal. No focal bony abnormality. IMPRESSION: Emphysema without acute disease. Electronically Signed   By: Drusilla Kannerhomas  Dalessio M.D.   On: 12/05/2015 19:52    ASSESSMENT AND PLAN:   Principal Problem:   Acute respiratory failure with hypoxia (HCC) Active Problems:   Hypertension   Adjustment disorder with anxiety   COPD exacerbation (HCC)   BOOP (bronchiolitis obliterans with organizing pneumonia) (HCC)   * Acute respiratory failure with hypoxia (HCC) - patient's oxygenation improved significantly with BiPAP.   COPD exacerbation   In the ICU for now with continued BiPAP support until able to wean.  IV steroids, and precedex drip as per Pulmonary consult.  * COPD exacerbation (HCC) -  likely exacerbated by recent reinitiation of smoking as well as viral URI.   received IV magnesium, duo nebs, IV Solu-Medrol in the ED. continue IV steroids and nebulizer treatments , IV levofloxacin  * Hypertension - not on antihypertensives at home, however her blood pressure is elevated here. Some of this may be due to anxiety and her respiratory distress.   * Adjustment disorder with anxiety - home dose anxiolytics.   All the records are reviewed and case discussed with Care Management/Social Workerr. Management plans discussed with the patient, family and they are in agreement.  CODE STATUS: Full  TOTAL TIME TAKING CARE OF THIS PATIENT: 35 critical care minutes.   POSSIBLE D/C IN 2-3 DAYS, DEPENDING ON CLINICAL CONDITION.   Altamese DillingVACHHANI, Devontay Celaya M.D on 12/07/2015   Between 7am to 6pm - Pager - [phone]  After 6pm go to [email or web address] - password [email or web address]  [facility]  Office  [phone]  CC: Primary care physician; Corky DownsMASOUD,JAVED, MD  Note: This dictation was prepared with Dragon dictation along with smaller phrase technology. Any transcriptional errors that result from this process are unintentional.

## 2015-12-07 NOTE — Progress Notes (Addendum)
[facility] Critical Care Medicine Progess Note    ASSESSMENT/PLAN    PULMONARY A: Severe emphysema with acute exacerbation, likely due to acute bronchitis. Respiratory distress. P:  -Continue IV steroids for now, we'll try to limit the dose as this may be contributing to her anxiety and dyspnea. Continue duo nebs. Continue antibiotics for possible bronchitis.  NEUROLOGIC A: Severe anxiety, which is likely contributing to her dyspnea. P:  -It is difficult to determine how much of her dyspnea is from COPD versus anxiety. Given her minimal requirements on BiPAP, I suspect that dyspnea may be a major driver. -We'll treat with scheduled alprazolam orally. Have also started the patient on Precedex to be used to help with her breathing. --Observed the patient while sleeping, her breathing is much better controlled with RR around 25.    CARDIOVASCULAR  A: History of coronary artery disease, myocardial infarction. P:  Aspirin 81 mg. Enoxaparin for DVT prophylaxis.  RENAL A: Stable.  GASTROINTESTINAL A: On famotidine for GI prophylaxis.  HEMATOLOGIC A: --  INFECTIOUS A: Acute bronchitis. P: Levaquin 500 mg 5 days. MRSA screen. 3/21: Negative.  ENDOCRINE A: Will monitor blood glucose levels while on steroids. ---------------------------------------   ----------------------------------------   Name: Caitlyn Evans MRN: [id number] DOB: 22-Sep-1965    ADMISSION DATE:  12/05/2015    CHIEF COMPLAINT:  Dyspnea.     SUBJECTIVE:  No new complaints, still winded.   Review of Systems:  Constitutional: Feels well. Cardiovascular: No chest pain.  Pulmonary: Denies dyspnea.   The remainder of systems were reviewed and were found to be negative other than what is documented in the HPI.    VITAL SIGNS: Temp:  [96.7 F (35.9 C)-97.9 F (36.6 C)] 97.7 F (36.5 C) (03/23 1200) Pulse Rate:  [53-97] 63 (03/23 1600) Resp:  [15-30] 20 (03/23 1600) BP:  (82-118)/(45-84) 88/55 mmHg (03/23 1600) SpO2:  [98 %-100 %] 100 % (03/23 1600) FiO2 (%):  [35 %] 35 % (03/23 0155) HEMODYNAMICS:   VENTILATOR SETTINGS: Vent Mode:  [-]  FiO2 (%):  [35 %] 35 % INTAKE / OUTPUT:  Intake/Output Summary (Last 24 hours) at 12/07/15 1643 Last data filed at 12/07/15 1600  Gross per 24 hour  Intake  259.5 ml  Output   1800 ml  Net -1540.5 ml    PHYSICAL EXAMINATION: Physical Examination:   VS: BP 88/55 mmHg  Pulse 63  Temp(Src) 97.7 F (36.5 C) (Oral)  Resp 20  Ht 4\' 11"  (1.499 m)  Wt 115 lb (52.164 kg)  BMI 23.21 kg/m2  SpO2 100%  General Appearance: No distress  Neuro:without focal findings, mental status normal. HEENT: PERRLA, EOM intact. Pulmonary: decreased breath sounds  With wheezing, improved from yesterday.  CardiovascularNormal S1,S2.  No m/r/g.   Abdomen: Benign, Soft, non-tender. Renal:  No costovertebral tenderness  GU:  Not performed at this time. Endocrine: No evident thyromegaly. Skin:   warm, no rashes, no ecchymosis  Extremities: normal, no cyanosis, clubbing.   LABS:   LABORATORY PANEL:   CBC  Recent Labs Lab 12/07/15 0351  WBC 13.7*  HGB 11.8*  HCT 37.1  PLT 266    Chemistries   Recent Labs Lab 12/05/15 1857  12/07/15 0351  NA 140  < > 138  K 4.0  < > 4.4  CL 105  < > 105  CO2 24  < > 29  GLUCOSE 111*  < > 129*  BUN 18  < > 20  CREATININE 0.73  < > 0.58  CALCIUM 9.1  < > 8.5*  MG 2.2  --   --   AST 24  --   --   ALT 19  --   --   ALKPHOS 81  --   --   BILITOT 0.3  --   --   < > = values in this interval not displayed.   Recent Labs Lab 12/06/15 2039 12/07/15 0121 12/07/15 0420 12/07/15 0728 12/07/15 1124 12/07/15 1613  GLUCAP 105* 104* 119* 112* 117* 118*    Recent Labs Lab 12/05/15 1837  PHART 7.54*  PCO2ART 31*  PO2ART 192*    Recent Labs Lab 12/05/15 1857  AST 24  ALT 19  ALKPHOS 81  BILITOT 0.3  ALBUMIN 4.2    Cardiac Enzymes No results for input(s):  TROPONINI in the last 168 hours.  RADIOLOGY:  Dg Chest Portable 1 View  12/05/2015  CLINICAL DATA:  Shortness of breath since yesterday. History of COPD. Initial encounter. EXAM: PORTABLE CHEST 1 VIEW COMPARISON:  PA and lateral chest 07/23/2013 and 12/26/2014. FINDINGS: The lungs are emphysematous with apical scar. No consolidative process, pneumothorax or effusion is identified. Heart size is normal. No focal bony abnormality. IMPRESSION: Emphysema without acute disease. Electronically Signed   By: Drusilla Kanner M.D.   On: 12/05/2015 19:52       --Wells Guiles, MD.  [facility]   Santiago Glad, M.D.  Stephanie Acre, M.D.  Billy Fischer, M.D  12/07/2015   Critical Care Attestation.  I have personally obtained a history, examined the patient, evaluated laboratory and imaging results, formulated the assessment and plan and placed orders. The Patient requires high complexity decision making for assessment and support, frequent evaluation and titration of therapies, application of advanced monitoring technologies and extensive interpretation of multiple databases. The patient has critical illness that could lead imminently to failure of 1 or more organ systems and requires the highest level of physician preparedness to intervene.  Critical Care Time devoted to patient care services described in this note is 35 minutes and is exclusive of time spent in procedures.

## 2015-12-07 NOTE — Progress Notes (Signed)
Pharmacy Antibiotic Note - Day 3  Caitlyn Evans is a 50 y.o. female admitted on 12/05/2015 with COPD exacerbation.  Pharmacy has been consulted for levofloxacin dosing.  Plan: Continue levofloxacin 500 mg IV daily   Height: 4\' 11"  (149.9 cm) Weight: 115 lb (52.164 kg) IBW/kg (Calculated) : 43.2  Temp (24hrs), Avg:97.6 F (36.4 C), Min:96.7 F (35.9 C), Max:98 F (36.7 C)   Recent Labs Lab 12/05/15 1857 12/05/15 2356 12/06/15 0431 12/07/15 0351  WBC 14.2* 14.8* 11.9* 13.7*  CREATININE 0.73 0.56 0.64 0.58    Estimated Creatinine Clearance: 60.8 mL/min (by C-G formula based on Cr of 0.58).    Allergies  Allergen Reactions  . Penicillins Hives and Other (See Comments)    Has patient had a PCN reaction causing immediate rash, facial/tongue/throat swelling, SOB or lightheadedness with hypotension: No Has patient had a PCN reaction causing severe rash involving mucus membranes or skin necrosis: No Has patient had a PCN reaction that required hospitalization No Has patient had a PCN reaction occurring within the last 10 years: No If all of the above answers are "NO", then may proceed with Cephalosporin use.  . Codeine Hives and Nausea And Vomiting    Antimicrobials this admission: levofloxacin 3/21 >>   Microbiology:  3/21 MRSA PCR: negative  Thank you for allowing pharmacy to be a part of this patient's care.  Bertram SavinSimpson,Michael L, PharmD Clinical Pharmacist 12/07/2015 8:56 AM

## 2015-12-07 NOTE — Progress Notes (Signed)
Dr Ardyth Manam gave order for patient to be stepdown level of care

## 2015-12-07 NOTE — Progress Notes (Signed)
Patient was placed on HFNC at 40lpm and 35%.  The patient's nurse called to say patient stated she was unable to tolerate and had to be placed back on BIPAP.  Patient was on HFNC less than 15 minutes.

## 2015-12-08 DIAGNOSIS — J9600 Acute respiratory failure, unspecified whether with hypoxia or hypercapnia: Secondary | ICD-10-CM | POA: Insufficient documentation

## 2015-12-08 DIAGNOSIS — J441 Chronic obstructive pulmonary disease with (acute) exacerbation: Secondary | ICD-10-CM | POA: Insufficient documentation

## 2015-12-08 LAB — CBC
HEMATOCRIT: 39 % (ref 35.0–47.0)
HEMOGLOBIN: 12.7 g/dL (ref 12.0–16.0)
MCH: 28.2 pg (ref 26.0–34.0)
MCHC: 32.4 g/dL (ref 32.0–36.0)
MCV: 87 fL (ref 80.0–100.0)
Platelets: 272 10*3/uL (ref 150–440)
RBC: 4.48 MIL/uL (ref 3.80–5.20)
RDW: 18.4 % — ABNORMAL HIGH (ref 11.5–14.5)
WBC: 10.7 10*3/uL (ref 3.6–11.0)

## 2015-12-08 LAB — GLUCOSE, CAPILLARY
GLUCOSE-CAPILLARY: 86 mg/dL (ref 65–99)
GLUCOSE-CAPILLARY: 85 mg/dL (ref 65–99)
Glucose-Capillary: 107 mg/dL — ABNORMAL HIGH (ref 65–99)
Glucose-Capillary: 114 mg/dL — ABNORMAL HIGH (ref 65–99)

## 2015-12-08 LAB — BASIC METABOLIC PANEL
ANION GAP: 8 (ref 5–15)
BUN: 18 mg/dL (ref 6–20)
CHLORIDE: 102 mmol/L (ref 101–111)
CO2: 28 mmol/L (ref 22–32)
Calcium: 8.7 mg/dL — ABNORMAL LOW (ref 8.9–10.3)
Creatinine, Ser: 0.61 mg/dL (ref 0.44–1.00)
GFR calc non Af Amer: >60 mL/min (ref >60)
Glucose, Bld: 113 mg/dL — ABNORMAL HIGH (ref 65–99)
Potassium: 3.9 mmol/L (ref 3.5–5.1)
Sodium: 138 mmol/L (ref 135–145)

## 2015-12-08 LAB — INFLUENZA PANEL BY PCR (TYPE A & B)
H1N1FLUPCR: DETECTED — AB
Influenza A By PCR: POSITIVE — AB
Influenza B By PCR: NEGATIVE

## 2015-12-08 MED ORDER — PREDNISONE 50 MG PO TABS
50.0000 mg | ORAL_TABLET | Freq: Every day | ORAL | Status: AC
  Administered 2015-12-09: 50 mg via ORAL
  Filled 2015-12-08: qty 1

## 2015-12-08 MED ORDER — PREDNISONE 20 MG PO TABS: 30.0000 mg | ORAL_TABLET | Freq: Every day | ORAL | Status: DC

## 2015-12-08 MED ORDER — INSULIN ASPART 100 UNIT/ML ~~LOC~~ SOLN: 0.0000 [IU] | Freq: Three times a day (TID) | SUBCUTANEOUS | Status: DC

## 2015-12-08 MED ORDER — PREDNISONE 10 MG PO TABS: 10.0000 mg | ORAL_TABLET | Freq: Every day | ORAL | Status: DC

## 2015-12-08 MED ORDER — PHENOL 1.4 % MT LIQD
1.0000 | OROMUCOSAL | Status: DC | PRN
  Filled 2015-12-08: qty 177

## 2015-12-08 MED ORDER — LEVOFLOXACIN 500 MG PO TABS
500.0000 mg | ORAL_TABLET | Freq: Every day | ORAL | Status: DC
  Administered 2015-12-08 – 2015-12-09 (×2): 500 mg via ORAL
  Filled 2015-12-08 (×2): qty 1

## 2015-12-08 MED ORDER — ZOLPIDEM TARTRATE 5 MG PO TABS
10.0000 mg | ORAL_TABLET | Freq: Every day | ORAL | Status: DC
  Administered 2015-12-08: 10 mg via ORAL
  Filled 2015-12-08: qty 2

## 2015-12-08 MED ORDER — GUAIFENESIN 100 MG/5ML PO SOLN
5.0000 mL | ORAL | Status: DC | PRN
  Administered 2015-12-08: 100 mg via ORAL
  Filled 2015-12-08 (×2): qty 5

## 2015-12-08 MED ORDER — INSULIN ASPART 100 UNIT/ML ~~LOC~~ SOLN: 0.0000 [IU] | Freq: Every day | SUBCUTANEOUS | Status: DC

## 2015-12-08 MED ORDER — OSELTAMIVIR PHOSPHATE 75 MG PO CAPS
75.0000 mg | ORAL_CAPSULE | Freq: Two times a day (BID) | ORAL | Status: DC
  Administered 2015-12-08 – 2015-12-09 (×2): 75 mg via ORAL
  Filled 2015-12-08 (×4): qty 1

## 2015-12-08 MED ORDER — PREDNISONE 20 MG PO TABS: 40.0000 mg | ORAL_TABLET | Freq: Every day | ORAL | Status: DC

## 2015-12-08 MED ORDER — PREDNISONE 20 MG PO TABS: 20.0000 mg | ORAL_TABLET | Freq: Every day | ORAL | Status: DC

## 2015-12-08 MED ORDER — BENZONATATE 100 MG PO CAPS
200.0000 mg | ORAL_CAPSULE | Freq: Three times a day (TID) | ORAL | Status: DC | PRN
  Administered 2015-12-08: 200 mg via ORAL
  Filled 2015-12-08: qty 2

## 2015-12-08 MED ORDER — LORAZEPAM 1 MG PO TABS
1.0000 mg | ORAL_TABLET | ORAL | Status: DC | PRN
  Administered 2015-12-08 – 2015-12-09 (×3): 1 mg via ORAL
  Filled 2015-12-08: qty 2
  Filled 2015-12-08 (×2): qty 1

## 2015-12-08 NOTE — Evaluation (Signed)
Physical Therapy Evaluation Patient Details Name: Caitlyn Evans MRN: [id number] DOB: 09/04/1966 Today's Date: 12/08/2015   History of Present Illness  Pt has a history of lung issues and multiple intubations, she came to the ER 3 days ago with respiratory distress and needed supplemental O2 and was found to have the flu.    Clinical Impression  Pt did very well with PT and showed no safety concerns. She was able to ambulate ~500 ft w/o LOBs or excessive fatigue and she reports that she is feeling close to her baseline.  She has good strength and balance and ultlmately does not need further PT intervention.     Follow Up Recommendations No PT follow up    Equipment Recommendations       Recommendations for Other Services       Precautions / Restrictions Precautions Precautions:  (mod fall) Restrictions Weight Bearing Restrictions: No      Mobility  Bed Mobility Overal bed mobility: Independent             General bed mobility comments: able to get to EOB w/o issue  Transfers Overall transfer level: Independent               General transfer comment: good confidence, good balance, no safety issues  Ambulation/Gait Ambulation/Gait assistance: Independent Ambulation Distance (Feet): 500 Feet Assistive device: None       General Gait Details: Pt able to ambulate with consistent, confident cadence.  She has no signficant SOB (O2 remains in the mid/high 90s on room air) and her HR is elevated but consistent 110-125 BPM.    Stairs            Wheelchair Mobility    Modified Rankin (Stroke Patients Only)       Balance                                             Pertinent Vitals/Pain Pain Assessment: No/denies pain    Home Living Family/patient expects to be discharged to:: Private residence Living Arrangements: Spouse/significant other;Other relatives Available Help at Discharge: Family   Home Access: Stairs to  enter Entrance Stairs-Rails: Can reach both Entrance Stairs-Number of Steps: 3 Home Layout: One level        Prior Function Level of Independence: Independent         Comments: Pt is very active, able to do all she needs w/o issue     Hand Dominance        Extremity/Trunk Assessment   Upper Extremity Assessment: Overall WFL for tasks assessed           Lower Extremity Assessment: Overall WFL for tasks assessed         Communication   Communication: No difficulties  Cognition Arousal/Alertness: Awake/alert Behavior During Therapy: WFL for tasks assessed/performed Overall Cognitive Status: Within Functional Limits for tasks assessed                      General Comments      Exercises        Assessment/Plan    PT Assessment Patent does not need any further PT services  PT Diagnosis Generalized weakness   PT Problem List    PT Treatment Interventions     PT Goals (Current goals can be found in the Care Plan section) Acute Rehab PT Goals Patient  Stated Goal: "Go home"    Frequency     Barriers to discharge        Co-evaluation               End of Session Equipment Utilized During Treatment: Gait belt Activity Tolerance: Patient tolerated treatment well Patient left: in bed;with family/visitor present Nurse Communication: Mobility status         Time: [id number] PT Time Calculation (min) (ACUTE ONLY): 19 min   Charges:   PT Evaluation $PT Eval Low Complexity: 1 Procedure     PT G Codes:       Loran SentersGalen Denisia Harpole, PT, DPT [phone]  Malachi ProGalen R Keiva Dina 12/08/2015, 5:44 PM

## 2015-12-08 NOTE — Progress Notes (Signed)
Patient has remained alert, oriented x4, and calm throughout the night.  Vital signs have been stable and the patient has been afebrile.  PRN pain meds were administered for a migraine headache throughout the night but anxiety was controlled well on precedex gtt and scheduled ativan.  Patient has been voiding in the bedpan well and urine output has been adequate.  Will continue to monitor.

## 2015-12-08 NOTE — Progress Notes (Signed)
[facility] Critical Care Medicine Progess Note    ASSESSMENT/PLAN    PULMONARY A: Severe emphysema with acute exacerbation, likely due to acute bronchitis.Breathing appears significantly improved, the patient has been off BiPAP, and did not require any overnight. She has been maintained on 4 L of oxygen. Respiratory distress. P:  -Can change to PO steroids today. Continue duo nebs. Continue antibiotics for possible bronchitis, will limit course to 5 days.   NEUROLOGIC A: Severe anxiety, which is likely contributing to her dyspnea. P:  -Improved today. -Continue with scheduled Xanax.   CARDIOVASCULAR  A: History of coronary artery disease, myocardial infarction. P:  Aspirin 81 mg. Enoxaparin for DVT prophylaxis.  RENAL A: Stable.  GASTROINTESTINAL A: On famotidine for GI prophylaxis.  HEMATOLOGIC A: --  INFECTIOUS A: Acute bronchitis. P: Levaquin 500 mg 5 days. MRSA screen. 3/21: Negative.  ENDOCRINE A: Will monitor blood glucose levels while on steroids. ---------------------------------------   ----------------------------------------   Name: Heide Scalesammy C Ariola MRN: [id number] DOB: 09-08-1966    ADMISSION DATE:  12/05/2015    CHIEF COMPLAINT:  Dyspnea.     SUBJECTIVE:  No new complaints, still winded, but doing better. She was maintained on nasal cannula. Over the course of the night, she did not require BiPAP.  Review of Systems:  Constitutional: Feels well. Cardiovascular: No chest pain.  Pulmonary: Denies dyspnea.   The remainder of systems were reviewed and were found to be negative other than what is documented in the HPI.    VITAL SIGNS: Temp:  [97.2 F (36.2 C)-97.8 F (36.6 C)] 97.2 F (36.2 C) (03/24 0800) Pulse Rate:  [58-92] 92 (03/24 0800) Resp:  [14-27] 23 (03/24 0800) BP: (85-127)/(49-84) 112/72 mmHg (03/24 0800) SpO2:  [98 %-100 %] 99 % (03/24 0800) HEMODYNAMICS:   VENTILATOR SETTINGS:   INTAKE /  OUTPUT:  Intake/Output Summary (Last 24 hours) at 12/08/15 0925 Last data filed at 12/08/15 0800  Gross per 24 hour  Intake 570.15 ml  Output   2750 ml  Net -2179.85 ml    PHYSICAL EXAMINATION: Physical Examination:   VS: BP 112/72 mmHg  Pulse 92  Temp(Src) 97.2 F (36.2 C) (Oral)  Resp 23  Ht 4\' 11"  (1.499 m)  Wt 115 lb (52.164 kg)  BMI 23.21 kg/m2  SpO2 99%  General Appearance: No distress  Neuro:without focal findings, still anxious, but appears better than previous days. HEENT: PERRLA, EOM intact. Pulmonary: decreased breath sounds  , minimal wheezing today and greatly improved from previous days.  CardiovascularNormal S1,S2.  No m/r/g.   Abdomen: Benign, Soft, non-tender. Renal:  No costovertebral tenderness  GU:  Not performed at this time. Endocrine: No evident thyromegaly. Skin:   warm, no rashes, no ecchymosis  Extremities: normal, no cyanosis, clubbing.   LABS:   LABORATORY PANEL:   CBC  Recent Labs Lab 12/08/15 0517  WBC 10.7  HGB 12.7  HCT 39.0  PLT 272    Chemistries   Recent Labs Lab 12/05/15 1857  12/08/15 0517  NA 140  < > 138  K 4.0  < > 3.9  CL 105  < > 102  CO2 24  < > 28  GLUCOSE 111*  < > 113*  BUN 18  < > 18  CREATININE 0.73  < > 0.61  CALCIUM 9.1  < > 8.7*  MG 2.2  --   --   AST 24  --   --   ALT 19  --   --   ALKPHOS 81  --   --  BILITOT 0.3  --   --   < > = values in this interval not displayed.   Recent Labs Lab 12/07/15 0728 12/07/15 1124 12/07/15 1613 12/07/15 1948 12/08/15 0010 12/08/15 0752  GLUCAP 112* 117* 118* 101* 114* 107*    Recent Labs Lab 12/05/15 1837  PHART 7.54*  PCO2ART 31*  PO2ART 192*    Recent Labs Lab 12/05/15 1857  AST 24  ALT 19  ALKPHOS 81  BILITOT 0.3  ALBUMIN 4.2    Cardiac Enzymes No results for input(s): TROPONINI in the last 168 hours.  RADIOLOGY:  No results found.     --Wells Guiles, MD.  [facility]   Santiago Glad, M.D.   Stephanie Acre, M.D.  Billy Fischer, M.D  12/08/2015   Critical Care Attestation.  I have personally obtained a history, examined the patient, evaluated laboratory and imaging results, formulated the assessment and plan and placed orders. The Patient requires high complexity decision making for assessment and support, frequent evaluation and titration of therapies, application of advanced monitoring technologies and extensive interpretation of multiple databases. The patient has critical illness that could lead imminently to failure of 1 or more organ systems and requires the highest level of physician preparedness to intervene.  Critical Care Time devoted to patient care services described in this note is 35 minutes and is exclusive of time spent in procedures.

## 2015-12-08 NOTE — Progress Notes (Signed)
Name: Caitlyn Evans MRN: [id number] DOB: 01-05-1966    ADMISSION DATE:  12/05/2015   CONSULTATION DATE:  12/06/2015  REFERRING MD :  Elisabeth Pigeon  CHIEF COMPLAINT:  Dyspnea  HISTORY OF PRESENT ILLNESS:  Patient is seen as per ER presentation as indicated below. At the time of my evaluation, the patient is very dyspneic on BiPAP. Therefore, all history was obtained via chart and from the patient's husband who was at the bedside. The patient tells me that she has a history of COPD, apparently she has a history of multiple intubations in the past. She was previously seeing a pulmonologist, Dr. Park Breed. However, she is no longer seeing him and instead followed with her primary care doctor. Patient's husband tells me that she has had a history of bronchiolitis obliterans-organizing pneumonia She has a history of myocardial infarction, status post cardiac catheterization. She had a left upper lobe wedge resection of a benign lung nodule at [facility] in May 2012. My initial evaluation, the patient appeared to be extremely dyspneic with a respiratory rate of approximately 40. It appeared that she was predominantly anxious, more than having respiratory problems, her husband says that she does this frequently. We spoke with her and had her try to calm down, she was given Ativan 2 mg IV push with improvement in her breathing, she was subsequent restarted on Precedex. Despite her severe dyspnea, she appears to be compensated on her arterial blood gas, which does not show any evidence of hypercapnia or imminent respiratory failure. In addition, she is requiring a BiPAP of only 10 over 5, with an FiO2 of 30% to keep her options saturation at 100%. This appears to suggest that her severe dyspnea may be severely exacerbated by anxiety.  Review of chest x-ray images show severe emphysema, in comparison with old films. I do not see any acute changes. CT chest report from January 2009 is reviewed. (Images are not available),  consistent with severe emphysema, pleural parenchymal scarring.  Per ER:  Caitlyn Evans is a 50 y.o. female with a history of severe COPD who his been intubated at least 4 times in the past, the last time about 4 years ago, who presents with approximately 2 days of gradual onset worsening shortness of breath that is now severe. She reports that it started with someupper respiratory symptoms such as a worsening of her chronic cough, congestion, and a sore throat. Over the last 2 days is gotten worse and she is having severe respiratory distress and increased work of breathing and wheezing in spite of using all of her regular medications and multiple nebulizer treatments. She denies fever/chills, chest pain, abdominal pain. She has had some nausea but no vomiting. It feels similar to her prior COPD exacerbations.  SUBJECTIVE: Reports reports moderate improvement in breathing. She is on Precedex currently at 0.2. Her insight is much better. Her oxygen saturation is 94% on room air. She denies chest pain, palpitations, nausea and vomiting but reports persistent cough and sinus congestion.    VITAL SIGNS: Temp:  [97.2 F (36.2 C)-97.8 F (36.6 C)] 97.2 F (36.2 C) (03/24 0800) Pulse Rate:  [58-92] 81 (03/24 1102) Resp:  [14-27] 17 (03/24 1102) BP: (85-127)/(49-84) 108/63 mmHg (03/24 1102) SpO2:  [92 %-100 %] 93 % (03/24 1102)  PHYSICAL EXAMINATION: General: Appears acutely ill, no acute distress Neuro: Alert and oriented 4, no focal deficits HEENT: PERRLA, oral mucosa moist and pink Cardiovascular: Rate and rhythm regular,  S1, S2, no murmur, gallop or regurg  Lungs: Diminished breath sounds in all lung fields, mild expiratory wheezes in right lung fields, positive rhonchi in anterior lung fields Abdomen:  Abdomen is soft, nontender with normal bowel sounds Musculoskeletal:  Positive range of motion in upper and lower extremities Extremities: +2 pulses, no edema Skin: Warm and dry, no  rash   Recent Labs Lab 12/06/15 0431 12/07/15 0351 12/08/15 0517  NA 140 138 138  K 4.1 4.4 3.9  CL 108 105 102  CO2 26 29 28   BUN 19 20 18   CREATININE 0.64 0.58 0.61  GLUCOSE 176* 129* 113*    Recent Labs Lab 12/06/15 0431 12/07/15 0351 12/08/15 0517  HGB 11.6* 11.8* 12.7  HCT 35.9 37.1 39.0  WBC 11.9* 13.7* 10.7  PLT 269 266 272   No results found.  STUDIES:  None  CULTURES: -MRSA screen negative  ANTIBIOTICS: Levofloxacin 3 to 01/04/2016 for 3 doses  SIGNIFICANT EVENTS: 12/05/2015: ED with dyspnea, admitted with acute on chronic respiratory failure  LINES/TUBES: Peripheral IVs  DISCUSSION: 50 year old white female, current smoker, history of emphysema and lung mass, presenting with severe emphysema with acute exacerbation, acute bronchitis, and dyspnea secondary to severe anxiety ASSESSMENT / PLAN:  PULMONARY A: Severe emphysema with acute exacerbation, likely due to acute bronchitis. Respiratory distress. P:   -Continue IV steroids for now, we'll try to limit the dose as this may be contributing to her anxiety and dyspnea. -Continue nebulized bronchodilators.  -Antibiotics for possible bronchitis.  CARDIOVASCULAR A:  Hyperlipidemia P:  -Continue home meds  RENAL A:   Acute issues P:   -Monitor and replace electrolytes -Monitor creatinine  GASTROINTESTINAL A:   No acute issues P:   -Nothing by mouth while on BiPAP -H2-blocker for stress ulcer prophylaxis  HEMATOLOGIC A:   No acute issues P:  -Lovenox for VTE prophylaxis  INFECTIOUS A:   Acute bronchitis P:   -Antibiotics as above. -We'll panculture if febrile  ENDOCRINE A:   Steroid-induced hyperglycemia  P:   -Monitor blood glucose with BMP. -We'll consider low-dose sliding scale insulin coverage if blood glucose is consistently elevated  NEUROLOGIC A:   Severe anxiety P:   RASS goal: N/A -Titrate off Precedex. -Xanax 0.25 mg 3 times a day and lorazepam  prn  Disposition and Family update: Patient and updated on current treatment plan. No family at bedside. Transfer patient to floor if okay with primary team   Best Practice: Code Status: Full code Diet: Heart healthy diet/nothing by mouth when on continuous BiPAP GI prophylaxis: H2-blocker VTE prophylaxis:  SCD's /Lovenox  Magdalene S. [facility] ANP-BC Pulmonary and Critical Care Medicine [facility] Pager [phone]   12/08/2015, 11:42 AM   Pt seen and examined with NP TUkov, aecopd complicated by anxiety, doing significant better today. Will DC bipap, OK to transfer to floor.   Wells Guileseep Meliton Samad, MD.  12/08/2015

## 2015-12-08 NOTE — Care Management (Addendum)
Hope to have patient weaned off precedex drip later this morning and anticipate will be able to transfer out of icu this day.  Patient has nebulizer at home and she is very verbal and adamant "that I am not going to have oxygen when I leave here."  She is very passionate in her statement. She says she has not had 02 in the home for the last four years and the only reason she is having trouble now is because she caught a cold.  Asked her very gently if it was determined that she  needed 02 at home at discharge she said she would think about it.  She has had home 02 before and was able to "get off of it."  Feels like if she has to discharge on oxygen that it "may mean the end for me."  She does not says she will consider home health if MD feels it is indicated.  The more CM spoke the more anxious patient became.

## 2015-12-08 NOTE — Progress Notes (Signed)
[facility] [place] [facility]   PATIENT NAME: Caitlyn Rilesammy Poteet    MR#:  [id number]  DATE OF BIRTH:  November 16, 1965  SUBJECTIVE:  CHIEF COMPLAINT:   Chief Complaint  Patient presents with  . Shortness of Breath     Came with severe respi distress, required BIpap,also on precedex drip for anxiety.    Now on room air.   Positive for flu.  REVIEW OF SYSTEMS:  CONSTITUTIONAL: No fever, fatigue or weakness.  EYES: No blurred or double vision.  EARS, NOSE, AND THROAT: No tinnitus or ear pain.  RESPIRATORY: No cough,Positive shortness of breath, wheezing , no hemoptysis.  CARDIOVASCULAR: No chest pain, orthopnea, edema.  GASTROINTESTINAL: No nausea, vomiting, diarrhea or abdominal pain.  GENITOURINARY: No dysuria, hematuria.  ENDOCRINE: No polyuria, nocturia,  HEMATOLOGY: No anemia, easy bruising or bleeding SKIN: No rash or lesion. MUSCULOSKELETAL: No joint pain or arthritis.   NEUROLOGIC: No tingling, numbness, weakness.  PSYCHIATRY: No anxiety or depression.   ROS  DRUG ALLERGIES:   Allergies  Allergen Reactions  . Penicillins Hives and Other (See Comments)    Has patient had a PCN reaction causing immediate rash, facial/tongue/throat swelling, SOB or lightheadedness with hypotension: No Has patient had a PCN reaction causing severe rash involving mucus membranes or skin necrosis: No Has patient had a PCN reaction that required hospitalization No Has patient had a PCN reaction occurring within the last 10 years: No If all of the above answers are "NO", then may proceed with Cephalosporin use.  . Codeine Hives and Nausea And Vomiting    VITALS:  Blood pressure 109/71, pulse 88, temperature 98.9 F (37.2 C), temperature source Oral, resp. rate 25, height 4\' 11"  (1.499 m), weight 52.164 kg (115 lb), SpO2 100 %.  PHYSICAL EXAMINATION:  GENERAL:  50 y.o.-year-old patient lying in the bed with no acute distress.  EYES: Pupils equal, round, reactive to  light and accommodation. No scleral icterus. Extraocular muscles intact.  HEENT: Head atraumatic, normocephalic. Oropharynx and nasopharynx clear.  NECK:  Supple, no jugular venous distention. No thyroid enlargement, no tenderness.  LUNGS: Normal breath sounds bilaterally, positive wheezing, no crepitation. No use of accessory muscles of respiration. On room air. CARDIOVASCULAR: S1, S2 normal. No murmurs, rubs, or gallops.  ABDOMEN: Soft, nontender, nondistended. Bowel sounds present. No organomegaly or mass.  EXTREMITIES: No pedal edema, cyanosis, or clubbing.  NEUROLOGIC: Cranial nerves II through XII are intact. Muscle strength 5/5 in all extremities. Sensation intact. Gait not checked.  PSYCHIATRIC: The patient is alert and oriented x 3.  SKIN: No obvious rash, lesion, or ulcer.   Physical Exam LABORATORY PANEL:   CBC  Recent Labs Lab 12/08/15 0517  WBC 10.7  HGB 12.7  HCT 39.0  PLT 272   ------------------------------------------------------------------------------------------------------------------  Chemistries   Recent Labs Lab 12/05/15 1857  12/08/15 0517  NA 140  < > 138  K 4.0  < > 3.9  CL 105  < > 102  CO2 24  < > 28  GLUCOSE 111*  < > 113*  BUN 18  < > 18  CREATININE 0.73  < > 0.61  CALCIUM 9.1  < > 8.7*  MG 2.2  --   --   AST 24  --   --   ALT 19  --   --   ALKPHOS 81  --   --   BILITOT 0.3  --   --   < > = values in this interval not  displayed. ------------------------------------------------------------------------------------------------------------------  Cardiac Enzymes No results for input(s): TROPONINI in the last 168 hours. ------------------------------------------------------------------------------------------------------------------  RADIOLOGY:  No results found.  ASSESSMENT AND PLAN:   Principal Problem:   Acute respiratory failure with hypoxia (HCC) Active Problems:   Hypertension   Adjustment disorder with anxiety   COPD  exacerbation (HCC)   BOOP (bronchiolitis obliterans with organizing pneumonia) (HCC)   Acute exacerbation of chronic obstructive pulmonary disease (COPD) (HCC)   Acute respiratory failure (HCC)   * Acute respiratory failure with hypoxia (HCC) - patient's oxygenation improved significantly with BiPAP.   COPD exacerbation   weaned from Bipap to Nasal canula oxygen.  IV steroids, and precedex drip as per Pulmonary consult.   Switch to oral steroid, to help with anxiety, on room air now.  * COPD exacerbation (HCC) - due to influenza A  likely exacerbated by recent reinitiation of smoking as well as viral URI.   received IV magnesium, duo nebs, IV Solu-Medrol in the ED.  IV steroids and nebulizer treatments , levofloxacin   On room air, added tamiflu.   Switch to oral steroid.  * Hypertension - not on antihypertensives at home, however her blood pressure is elevated here. Some of this may be due to anxiety and her respiratory distress.   * Adjustment disorder with anxiety - home dose anxiolytics.   All the records are reviewed and case discussed with Care Management/Social Workerr. Management plans discussed with the patient, family and they are in agreement.  CODE STATUS: Full  TOTAL TIME TAKING CARE OF THIS PATIENT: 35 minutes.   POSSIBLE D/C IN 2-3 DAYS, DEPENDING ON CLINICAL CONDITION.  Transfer to medical floor, and get PT eval. Altamese Dilling M.D on 12/08/2015   Between 7am to 6pm - Pager - [phone]  After 6pm go to [email or web address] - password [email or web address]  [facility]  Office  [phone]  CC: Primary care physician; Corky Downs, MD  Note: This dictation was prepared with Dragon dictation along with smaller phrase technology. Any transcriptional errors that result from this process are unintentional.

## 2015-12-08 NOTE — Progress Notes (Signed)
Pt is in no distress. Pt doesn't have Bipap in her room. Pt states that she doesn't wish to wear Bipap  tonight

## 2015-12-08 NOTE — Progress Notes (Signed)
Patient to move to room 216. Patient aware. Report called to Annabella RN on 2C.

## 2015-12-08 NOTE — Plan of Care (Signed)
Problem: Phase I Progression Outcomes Goal: Dyspnea controlled at rest Outcome: Progressing Patient on nasal cannula at 3 L and patient reports wears 2-3 L at home. Still very dyspneic with exertion, which combined with IV diuretics started today resulted in foley catheter remaining. [facility]. Patient to move to room 233. Patient and family at bedside aware.

## 2015-12-08 NOTE — Progress Notes (Signed)
Notified Dr Anne HahnWillis of increased heart rate with her cough and request for sleep medication, orders given.

## 2015-12-09 LAB — GLUCOSE, CAPILLARY: Glucose-Capillary: 84 mg/dL (ref 65–99)

## 2015-12-09 MED ORDER — OSELTAMIVIR PHOSPHATE 75 MG PO CAPS: 75.0000 mg | ORAL_CAPSULE | Freq: Two times a day (BID) | ORAL | Status: DC

## 2015-12-09 MED ORDER — PREDNISONE 10 MG (21) PO TBPK: 10.0000 mg | ORAL_TABLET | Freq: Every day | ORAL | Status: DC

## 2015-12-09 MED ORDER — BENZONATATE 200 MG PO CAPS: 200.0000 mg | ORAL_CAPSULE | Freq: Three times a day (TID) | ORAL | Status: DC | PRN

## 2015-12-09 NOTE — Progress Notes (Signed)
Pt d/c home; d/c instructions reviewed w/ pt; pt understanding was verbalized; IV removed catheter in tact, gauze dressing applied; all pt questions answered; pt left unit via wheelchair accompanied by staff 

## 2015-12-09 NOTE — Discharge Instructions (Signed)
Respiratory failure is when your lungs are not working well and your breathing (respiratory) system fails. When respiratory failure occurs, it is difficult for your lungs to get enough oxygen, get rid of carbon dioxide, or both. Respiratory failure can be life threatening.  °Respiratory failure can be acute or chronic. Acute respiratory failure is sudden, severe, and requires emergency medical treatment. Chronic respiratory failure is less severe, happens over time, and requires ongoing treatment.  °WHAT ARE THE CAUSES OF ACUTE RESPIRATORY FAILURE?  °Any problem affecting the heart or lungs can cause acute respiratory failure. Some of these causes include the following: °· Chronic bronchitis and emphysema (COPD).   °· Blood clot going to a lung (pulmonary embolism).   °· Having water in the lungs caused by heart failure, lung injury, or infection (pulmonary edema).   °· Collapsed lung (pneumothorax).   °· Pneumonia.   °· Pulmonary fibrosis.   °· Obesity.   °· Asthma.   °· Heart failure.   °· Any type of trauma to the chest that can make breathing difficult.   °· Nerve or muscle diseases making chest movements difficult. °HOW WILL MY ACUTE RESPIRATORY FAILURE BE TREATED?  °Treatment of acute respiratory failure depends on the cause of the respiratory failure. Usually, you will stay in the intensive care unit so your breathing can be watched closely. Treatment can include the following: °· Oxygen. Oxygen can be delivered through the following: °¨ Nasal cannula. This is small tubing that goes in your nose to give you oxygen. °¨ Face mask. A face mask covers your nose and mouth to give you oxygen. °· Medicine. Different medicines can be given to help with breathing. These can include: °¨ Nebulizers. Nebulizers deliver medicines to open the air passages (bronchodilators). These medicines help to open or relax the airways in the lungs so you can breathe better. They can also help loosen mucus from your  lungs. °¨ Diuretics. Diuretic medicines can help you breathe better by getting rid of extra water in your body. °¨ Steroids. Steroid medicines can help decrease swelling (inflammation) in your lungs. °¨ Antibiotics. °· Chest tube. If you have a collapsed lung (pneumothorax), a chest tube is placed to help reinflate the lung. °· Noninvasive positive pressure ventilation (NPPV). This is a tight-fitting mask that goes over your nose and mouth. The mask has tubing that is attached to a machine. The machine blows air into the tubing, which helps to keep the tiny air sacs (alveoli) in your lungs open. This machine allows you to breathe on your own. °· Ventilator. A ventilator is a breathing machine. When on a ventilator, a breathing tube is put into the lungs. A ventilator is used when you can no longer breathe well enough on your own. You may have low oxygen levels or high carbon dioxide (CO2) levels in your blood. When you are on a ventilator, sedation and pain medicines are given to make you sleep so your lungs can heal. °SEEK IMMEDIATE MEDICAL CARE IF: °· You have shortness of breath (dyspnea) with or without activity. °· You have rapid breathing (tachypnea). °· You are wheezing. °· You are unable to say more than a few words without having to catch your breath. °· You find it very difficult to function normally. °· You have a fast heart rate. °· You have a bluish color to your finger or toe nail beds. °· You have confusion or drowsiness or both. °  °This information is not intended to replace advice given to you by your health care provider. Make sure you discuss   any questions you have with your health care provider.   Document Released: 09/07/2013 Document Revised: 05/24/2015 Document Reviewed: 09/07/2013 Elsevier Interactive Patient Education 2016 [employer]  Follow all MD discharge instructions. Take all medications as prescribed. Keep all follow up appointments. If you experience any symptoms that are of  concern to you or that are bothersome to you, call your doctor. For all questions and/or concerns, call your doctor.   If you have a medical emergency, call 911

## 2015-12-09 NOTE — Discharge Summary (Signed)
[facility] - [place] at [facility]   PATIENT NAME: Caitlyn Evans    MR#:  [id number]  DATE OF BIRTH:  [birth year]  DATE OF ADMISSION:  12/05/2015 ADMITTING PHYSICIAN: Oralia Manis, MD  DATE OF DISCHARGE: No discharge date for patient encounter.  PRIMARY CARE PHYSICIAN: MASOUD,JAVED, MD    ADMISSION DIAGNOSIS:  Viral syndrome [B34.9] Sore throat [J02.9] Acute exacerbation of chronic obstructive pulmonary disease (COPD) (HCC) [J44.1] Acute respiratory failure, unspecified whether with hypoxia or hypercapnia (HCC) [J96.00]  DISCHARGE DIAGNOSIS:  Acute respiratory failure with hypoxia-resolved COPD exacerbation Influenza  SECONDARY DIAGNOSIS:   Past Medical History  Diagnosis Date  . Migraines   . Hypercholesterolemia   . Seasonal allergies   . Emphysema lung (HCC) 05/30/2015  . Dysphagia, pharyngoesophageal phase 05/30/2015  . Lung mass   . Shortness of breath dyspnea     1 flight-stairs  . Anxiety     panic attacks  . Motion sickness     all moving vehicles  . Myocardial infarction (HCC) 2012  . BOOP (bronchiolitis obliterans with organizing pneumonia) [facility])     HOSPITAL COURSE:  Caitlyn Evans  is a 50 y.o. female admitted 12/05/2015 with chief complaint of shortness of breath. She was originally started on Levaquin as well as steroids for COPD exacerbation. Test returned positive for influenza at that time she was started on Tamiflu. She has been making progressive improvement in her symptoms no longer on oxygen cough has improved back to baseline. Patient symptoms improved and now stable for discharge  DISCHARGE CONDITIONS:   Stable/improved  CONSULTS OBTAINED:  Treatment Team:  Wyatt Haste, MD  DRUG ALLERGIES:   Allergies  Allergen Reactions  . Penicillins Hives and Other (See Comments)    Has patient had a PCN reaction causing immediate rash, facial/tongue/throat swelling, SOB or lightheadedness with hypotension: No Has patient  had a PCN reaction causing severe rash involving mucus membranes or skin necrosis: No Has patient had a PCN reaction that required hospitalization No Has patient had a PCN reaction occurring within the last 10 years: No If all of the above answers are "NO", then may proceed with Cephalosporin use.  . Codeine Hives and Nausea And Vomiting    DISCHARGE MEDICATIONS:   Current Discharge Medication List    START taking these medications   Details  benzonatate (TESSALON) 200 MG capsule Take 1 capsule (200 mg total) by mouth 3 (three) times daily as needed for cough. Qty: 20 capsule, Refills: 0    oseltamivir (TAMIFLU) 75 MG capsule Take 1 capsule (75 mg total) by mouth 2 (two) times daily. Qty: 8 capsule, Refills: 0    predniSONE (STERAPRED UNI-PAK 21 TAB) 10 MG (21) TBPK tablet Take 1 tablet (10 mg total) by mouth daily.  oral 1 day, then  oral for 2 days, then  oral 2 days, then stop Qty: 10 tablet, Refills: 0      CONTINUE these medications which have NOT CHANGED   Details  ALPRAZolam (XANAX) 0.25 MG tablet Take 0.25 mg by mouth 2 (two) times daily.    HYDROcodone-acetaminophen (NORCO) 10-325 MG tablet Take 0.5 tablets by mouth 2 (two) times daily.    ipratropium-albuterol (DUONEB) 0.5-2.5 (3) MG/3ML SOLN Inhale 3 mLs into the lungs every 4 (four) hours as needed (for wheezing/shortness of breath).     zolpidem (AMBIEN) 10 MG tablet Take 10 mg by mouth at bedtime.      STOP taking these medications     doxycycline (VIBRA-TABS)  100 MG tablet      predniSONE (DELTASONE) 20 MG tablet      doxycycline (VIBRA-TABS) 100 MG tablet      predniSONE (DELTASONE) 20 MG tablet          DISCHARGE INSTRUCTIONS:  Continue steroid taper and Tamiflu  DIET:  Regular diet  DISCHARGE CONDITION:  Good  ACTIVITY:  Activity as tolerated  OXYGEN:  Home Oxygen: No.   Oxygen Delivery: room air  DISCHARGE LOCATION:  home   If you experience worsening of your admission  symptoms, develop shortness of breath, life threatening emergency, suicidal or homicidal thoughts you must seek medical attention immediately by calling 911 or calling your MD immediately  if symptoms less severe.  You Must read complete instructions/literature along with all the possible adverse reactions/side effects for all the Medicines you take and that have been prescribed to you. Take any new Medicines after you have completely understood and accpet all the possible adverse reactions/side effects.   Please note  You were cared for by a hospitalist during your hospital stay. If you have any questions about your discharge medications or the care you received while you were in the hospital after you are discharged, you can call the unit and asked to speak with the hospitalist on call if the hospitalist that took care of you is not available. Once you are discharged, your primary care physician will handle any further medical issues. Please note that NO REFILLS for any discharge medications will be authorized once you are discharged, as it is imperative that you return to your primary care physician (or establish a relationship with a primary care physician if you do not have one) for your aftercare needs so that they can reassess your need for medications and monitor your lab values.    On the day of Discharge:   VITAL SIGNS:  Blood pressure 124/84, pulse 118, temperature 98 F (36.7 C), temperature source Oral, resp. rate 20, height 4\' 11"  (1.499 m), weight 52.164 kg (115 lb), SpO2 93 %.  I/O:   Intake/Output Summary (Last 24 hours) at 12/09/15 0819 Last data filed at 12/09/15 0221  Gross per 24 hour  Intake 250.44 ml  Output   1900 ml  Net -1649.56 ml    PHYSICAL EXAMINATION:  GENERAL:  50 y.o.-year-old patient lying in the bed with no acute distress.  EYES: Pupils equal, round, reactive to light and accommodation. No scleral icterus. Extraocular muscles intact.  HEENT: Head  atraumatic, normocephalic. Oropharynx and nasopharynx clear.  NECK:  Supple, no jugular venous distention. No thyroid enlargement, no tenderness.  LUNGS: Normal breath sounds bilaterally, no wheezing, rales,rhonchi or crepitation. No use of accessory muscles of respiration.  CARDIOVASCULAR: S1, S2 normal. No murmurs, rubs, or gallops.  ABDOMEN: Soft, non-tender, non-distended. Bowel sounds present. No organomegaly or mass.  EXTREMITIES: No pedal edema, cyanosis, or clubbing.  NEUROLOGIC: Cranial nerves II through XII are intact. Muscle strength 5/5 in all extremities. Sensation intact. Gait not checked.  PSYCHIATRIC: The patient is alert and oriented x 3.  SKIN: No obvious rash, lesion, or ulcer.   DATA REVIEW:   CBC  Recent Labs Lab 12/08/15 0517  WBC 10.7  HGB 12.7  HCT 39.0  PLT 272    Chemistries   Recent Labs Lab 12/05/15 1857  12/08/15 0517  NA 140  < > 138  K 4.0  < > 3.9  CL 105  < > 102  CO2 24  < > 28  GLUCOSE  111*  < > 113*  BUN 18  < > 18  CREATININE 0.73  < > 0.61  CALCIUM 9.1  < > 8.7*  MG 2.2  --   --   AST 24  --   --   ALT 19  --   --   ALKPHOS 81  --   --   BILITOT 0.3  --   --   < > = values in this interval not displayed.  Cardiac Enzymes No results for input(s): TROPONINI in the last 168 hours.  Microbiology Results  Results for orders placed or performed during the hospital encounter of 12/05/15  MRSA PCR Screening     Status: None   Collection Time: 12/05/15 11:17 PM  Result Value Ref Range Status   MRSA by PCR NEGATIVE NEGATIVE Final    Comment:        The GeneXpert MRSA Assay (FDA approved for NASAL specimens only), is one component of a comprehensive MRSA colonization surveillance program. It is not intended to diagnose MRSA infection nor to guide or monitor treatment for MRSA infections.     RADIOLOGY:  No results found.   Management plans discussed with the patient, family and they are in agreement.  CODE STATUS:      Code Status Orders        Start     Ordered   12/05/15 2303  Full code   Continuous     12/05/15 2302    Code Status History    Date Active Date Inactive Code Status Order ID Comments User Context   This patient has a current code status but no historical code status.      TOTAL TIME TAKING CARE OF THIS PATIENT: 28 minutes.    Hower,  Mardi Mainland.D on 12/09/2015 at 8:19 AM  Between 7am to 6pm - Pager - [phone]  After 6pm go to [email or web address] - password [email or web address] [facility]  [place] [facility]  Office  [phone]  CC: Primary care physician; Corky Downs, MD

## 2015-12-09 NOTE — Care Management Note (Signed)
Case Management Note  Patient Details  Name: Heide Scalesammy C Dewan MRN: [id number] Date of Birth: [birth year]  Subjective/Objective:       Discharge home order today. No home health and no home oxygen ordered. Ms Zella BallWorkman already has a nebulizer machine at home.              Action/Plan:   Expected Discharge Date:                  Expected Discharge Plan:     In-House Referral:     Discharge planning Services     Post Acute Care Choice:    Choice offered to:     DME Arranged:    DME Agency:     HH Arranged:    HH Agency:     Status of Service:     Medicare Important Message Given:    Date Medicare IM Given:    Medicare IM give by:    Date Additional Medicare IM Given:    Additional Medicare Important Message give by:     If discussed at Long Length of Stay Meetings, dates discussed:    Additional Comments:  Aliani Caccavale A, RN 12/09/2015, 8:34 AM

## 2015-12-11 ENCOUNTER — Telehealth: Payer: Self-pay | Admitting: *Deleted

## 2015-12-11 NOTE — Telephone Encounter (Signed)
-----   Message from Shane CrutchPradeep Ramachandran, MD sent at 12/08/2015  3:33 PM EDT ----- Regarding: hfu Fu after discharge, about 4 weeks.

## 2015-12-11 NOTE — Telephone Encounter (Signed)
LMOM for pt to return call to schedule hosp f/u. 

## 2015-12-14 NOTE — Telephone Encounter (Signed)
Pt states she will call back to schedule hosp f/u at a later time. DR informed. Nothing further needed at this time.

## 2016-01-23 DIAGNOSIS — I119 Hypertensive heart disease without heart failure: Secondary | ICD-10-CM | POA: Diagnosis not present

## 2016-01-23 DIAGNOSIS — J439 Emphysema, unspecified: Secondary | ICD-10-CM | POA: Diagnosis not present

## 2016-01-23 DIAGNOSIS — I429 Cardiomyopathy, unspecified: Secondary | ICD-10-CM | POA: Diagnosis not present

## 2016-01-23 DIAGNOSIS — R042 Hemoptysis: Secondary | ICD-10-CM | POA: Diagnosis not present

## 2016-01-24 ENCOUNTER — Other Ambulatory Visit: Payer: Self-pay | Admitting: Internal Medicine

## 2016-01-24 DIAGNOSIS — R05 Cough: Secondary | ICD-10-CM

## 2016-01-24 DIAGNOSIS — R042 Hemoptysis: Secondary | ICD-10-CM

## 2016-01-24 DIAGNOSIS — R059 Cough, unspecified: Secondary | ICD-10-CM

## 2016-01-29 ENCOUNTER — Ambulatory Visit: Payer: Medicare Other

## 2016-01-31 ENCOUNTER — Ambulatory Visit
Admission: RE | Admit: 2016-01-31 | Discharge: 2016-01-31 | Disposition: A | Discharge status: Home / Self Care | Payer: Medicare Other | Source: Ambulatory Visit | Attending: Internal Medicine | Admitting: Internal Medicine

## 2016-01-31 DIAGNOSIS — R042 Hemoptysis: Secondary | ICD-10-CM | POA: Insufficient documentation

## 2016-01-31 DIAGNOSIS — R059 Cough, unspecified: Secondary | ICD-10-CM

## 2016-01-31 DIAGNOSIS — R05 Cough: Secondary | ICD-10-CM | POA: Insufficient documentation

## 2016-01-31 MED ORDER — IOPAMIDOL (ISOVUE-300) INJECTION 61%
75.0000 mL | Freq: Once | INTRAVENOUS | Status: AC | PRN
  Administered 2016-01-31: 75 mL via INTRAVENOUS

## 2016-02-01 DIAGNOSIS — R042 Hemoptysis: Secondary | ICD-10-CM | POA: Diagnosis not present

## 2016-02-05 ENCOUNTER — Encounter: Payer: Self-pay | Admitting: Emergency Medicine

## 2016-02-05 ENCOUNTER — Emergency Department: Payer: Medicare Other

## 2016-02-05 ENCOUNTER — Inpatient Hospital Stay
Admission: EM | Admit: 2016-02-05 | Discharge: 2016-02-08 | DRG: 872 | Disposition: A | Discharge status: Home / Self Care | Payer: Medicare Other | Attending: Internal Medicine | Admitting: Internal Medicine

## 2016-02-05 DIAGNOSIS — I248 Other forms of acute ischemic heart disease: Secondary | ICD-10-CM | POA: Diagnosis not present

## 2016-02-05 DIAGNOSIS — I252 Old myocardial infarction: Secondary | ICD-10-CM

## 2016-02-05 DIAGNOSIS — A419 Sepsis, unspecified organism: Secondary | ICD-10-CM | POA: Diagnosis not present

## 2016-02-05 DIAGNOSIS — Z886 Allergy status to analgesic agent status: Secondary | ICD-10-CM

## 2016-02-05 DIAGNOSIS — J441 Chronic obstructive pulmonary disease with (acute) exacerbation: Secondary | ICD-10-CM | POA: Diagnosis present

## 2016-02-05 DIAGNOSIS — Z823 Family history of stroke: Secondary | ICD-10-CM

## 2016-02-05 DIAGNOSIS — G894 Chronic pain syndrome: Secondary | ICD-10-CM | POA: Diagnosis present

## 2016-02-05 DIAGNOSIS — G8929 Other chronic pain: Secondary | ICD-10-CM | POA: Diagnosis present

## 2016-02-05 DIAGNOSIS — Z90721 Acquired absence of ovaries, unilateral: Secondary | ICD-10-CM | POA: Diagnosis not present

## 2016-02-05 DIAGNOSIS — J209 Acute bronchitis, unspecified: Secondary | ICD-10-CM | POA: Diagnosis not present

## 2016-02-05 DIAGNOSIS — R058 Other specified cough: Secondary | ICD-10-CM

## 2016-02-05 DIAGNOSIS — J440 Chronic obstructive pulmonary disease with acute lower respiratory infection: Secondary | ICD-10-CM | POA: Diagnosis present

## 2016-02-05 DIAGNOSIS — R000 Tachycardia, unspecified: Secondary | ICD-10-CM | POA: Diagnosis present

## 2016-02-05 DIAGNOSIS — Z8249 Family history of ischemic heart disease and other diseases of the circulatory system: Secondary | ICD-10-CM | POA: Diagnosis not present

## 2016-02-05 DIAGNOSIS — Z880 Allergy status to penicillin: Secondary | ICD-10-CM | POA: Diagnosis not present

## 2016-02-05 DIAGNOSIS — F17210 Nicotine dependence, cigarettes, uncomplicated: Secondary | ICD-10-CM | POA: Diagnosis present

## 2016-02-05 DIAGNOSIS — Z825 Family history of asthma and other chronic lower respiratory diseases: Secondary | ICD-10-CM

## 2016-02-05 DIAGNOSIS — R05 Cough: Secondary | ICD-10-CM | POA: Diagnosis not present

## 2016-02-05 DIAGNOSIS — Z902 Acquired absence of lung [part of]: Secondary | ICD-10-CM

## 2016-02-05 DIAGNOSIS — F411 Generalized anxiety disorder: Secondary | ICD-10-CM | POA: Diagnosis present

## 2016-02-05 DIAGNOSIS — I10 Essential (primary) hypertension: Secondary | ICD-10-CM | POA: Diagnosis not present

## 2016-02-05 DIAGNOSIS — Z7952 Long term (current) use of systemic steroids: Secondary | ICD-10-CM | POA: Diagnosis not present

## 2016-02-05 DIAGNOSIS — Z90710 Acquired absence of both cervix and uterus: Secondary | ICD-10-CM | POA: Diagnosis not present

## 2016-02-05 DIAGNOSIS — G4700 Insomnia, unspecified: Secondary | ICD-10-CM | POA: Diagnosis present

## 2016-02-05 DIAGNOSIS — J8489 Other specified interstitial pulmonary diseases: Secondary | ICD-10-CM | POA: Diagnosis present

## 2016-02-05 DIAGNOSIS — Z79899 Other long term (current) drug therapy: Secondary | ICD-10-CM

## 2016-02-05 DIAGNOSIS — E785 Hyperlipidemia, unspecified: Secondary | ICD-10-CM | POA: Diagnosis present

## 2016-02-05 DIAGNOSIS — I2510 Atherosclerotic heart disease of native coronary artery without angina pectoris: Secondary | ICD-10-CM | POA: Diagnosis present

## 2016-02-05 DIAGNOSIS — R748 Abnormal levels of other serum enzymes: Secondary | ICD-10-CM | POA: Diagnosis not present

## 2016-02-05 LAB — BASIC METABOLIC PANEL
Anion gap: 7 (ref 5–15)
BUN: 16 mg/dL (ref 6–20)
CALCIUM: 8.9 mg/dL (ref 8.9–10.3)
CO2: 26 mmol/L (ref 22–32)
CREATININE: 0.66 mg/dL (ref 0.44–1.00)
Chloride: 109 mmol/L (ref 101–111)
GLUCOSE: 95 mg/dL (ref 65–99)
Potassium: 4.1 mmol/L (ref 3.5–5.1)
Sodium: 142 mmol/L (ref 135–145)

## 2016-02-05 LAB — BLOOD GAS, ARTERIAL
ACID-BASE EXCESS: 4.7 mmol/L — AB (ref 0.0–3.0)
BICARBONATE: 25.1 meq/L (ref 21.0–28.0)
Delivery systems: POSITIVE
Expiratory PAP: 6
FIO2: 35
Inspiratory PAP: 16
O2 Saturation: 99.8 %
PATIENT TEMPERATURE: 37
PCO2 ART: 25 mmHg — AB (ref 32.0–48.0)
PH ART: 7.61 — AB (ref 7.350–7.450)
RATE: 10 resp/min
pO2, Arterial: 206 mmHg — ABNORMAL HIGH (ref 83.0–108.0)

## 2016-02-05 LAB — CBC WITH DIFFERENTIAL/PLATELET
BASOS ABS: 0.1 10*3/uL (ref 0–0.1)
Eosinophils Absolute: 0.1 10*3/uL (ref 0–0.7)
Eosinophils Relative: 0 %
HEMATOCRIT: 39.8 % (ref 35.0–47.0)
HEMOGLOBIN: 13.1 g/dL (ref 12.0–16.0)
Lymphs Abs: 0.8 10*3/uL — ABNORMAL LOW (ref 1.0–3.6)
MCH: 28.3 pg (ref 26.0–34.0)
MCHC: 33 g/dL (ref 32.0–36.0)
MCV: 85.8 fL (ref 80.0–100.0)
Monocytes Absolute: 0.7 10*3/uL (ref 0.2–0.9)
NEUTROS ABS: 19.8 10*3/uL — AB (ref 1.4–6.5)
Platelets: 298 10*3/uL (ref 150–440)
RBC: 4.64 MIL/uL (ref 3.80–5.20)
RDW: 17.9 % — ABNORMAL HIGH (ref 11.5–14.5)
WBC: 21.4 10*3/uL — ABNORMAL HIGH (ref 3.6–11.0)

## 2016-02-05 LAB — TROPONIN I
Troponin I: 0.06 ng/mL — ABNORMAL HIGH (ref <0.031)
Troponin I: 0.04 ng/mL — ABNORMAL HIGH (ref <0.031)

## 2016-02-05 MED ORDER — ONDANSETRON HCL 4 MG PO TABS: 4.0000 mg | ORAL_TABLET | Freq: Four times a day (QID) | ORAL | Status: DC | PRN

## 2016-02-05 MED ORDER — ACETAMINOPHEN 325 MG PO TABS: 650.0000 mg | ORAL_TABLET | Freq: Four times a day (QID) | ORAL | Status: DC | PRN

## 2016-02-05 MED ORDER — LEVOFLOXACIN IN D5W 750 MG/150ML IV SOLN
750.0000 mg | Freq: Once | INTRAVENOUS | Status: AC
  Administered 2016-02-05: 750 mg via INTRAVENOUS

## 2016-02-05 MED ORDER — ASPIRIN EC 325 MG PO TBEC
325.0000 mg | DELAYED_RELEASE_TABLET | Freq: Every day | ORAL | Status: DC
  Administered 2016-02-06 – 2016-02-08 (×3): 325 mg via ORAL
  Filled 2016-02-05 (×3): qty 1

## 2016-02-05 MED ORDER — HYDROCODONE-ACETAMINOPHEN 10-325 MG PO TABS
0.5000 | ORAL_TABLET | Freq: Two times a day (BID) | ORAL | Status: DC | PRN
  Administered 2016-02-05: 0.5 via ORAL
  Filled 2016-02-05 (×2): qty 1

## 2016-02-05 MED ORDER — ZOLPIDEM TARTRATE 5 MG PO TABS
5.0000 mg | ORAL_TABLET | Freq: Every day | ORAL | Status: DC
  Administered 2016-02-05 – 2016-02-07 (×3): 5 mg via ORAL
  Filled 2016-02-05 (×3): qty 1

## 2016-02-05 MED ORDER — ADULT MULTIVITAMIN W/MINERALS CH
1.0000 | ORAL_TABLET | Freq: Every day | ORAL | Status: DC
  Administered 2016-02-06 – 2016-02-08 (×3): 1 via ORAL
  Filled 2016-02-05 (×3): qty 1

## 2016-02-05 MED ORDER — ENOXAPARIN SODIUM 40 MG/0.4ML ~~LOC~~ SOLN
40.0000 mg | SUBCUTANEOUS | Status: DC
  Administered 2016-02-06: 21:00:00 40 mg via SUBCUTANEOUS
  Filled 2016-02-05 (×3): qty 0.4

## 2016-02-05 MED ORDER — IPRATROPIUM-ALBUTEROL 0.5-2.5 (3) MG/3ML IN SOLN
3.0000 mL | Freq: Once | RESPIRATORY_TRACT | Status: AC
  Administered 2016-02-05: 3 mL via RESPIRATORY_TRACT
  Filled 2016-02-05 (×2): qty 3

## 2016-02-05 MED ORDER — IPRATROPIUM-ALBUTEROL 0.5-2.5 (3) MG/3ML IN SOLN
RESPIRATORY_TRACT | Status: AC
  Administered 2016-02-05: 3 mL via RESPIRATORY_TRACT
  Filled 2016-02-05: qty 3

## 2016-02-05 MED ORDER — IPRATROPIUM-ALBUTEROL 0.5-2.5 (3) MG/3ML IN SOLN
3.0000 mL | Freq: Once | RESPIRATORY_TRACT | Status: AC
  Administered 2016-02-05: 3 mL via RESPIRATORY_TRACT
  Filled 2016-02-05: qty 3

## 2016-02-05 MED ORDER — IPRATROPIUM-ALBUTEROL 0.5-2.5 (3) MG/3ML IN SOLN
3.0000 mL | RESPIRATORY_TRACT | Status: DC | PRN
  Administered 2016-02-08: 04:00:00 3 mL via RESPIRATORY_TRACT
  Filled 2016-02-05: qty 3

## 2016-02-05 MED ORDER — LORAZEPAM 2 MG/ML IJ SOLN
INTRAMUSCULAR | Status: AC
  Administered 2016-02-05: 0.5 mg via INTRAVENOUS
  Filled 2016-02-05: qty 1

## 2016-02-05 MED ORDER — METHYLPREDNISOLONE SODIUM SUCC 125 MG IJ SOLR
125.0000 mg | Freq: Once | INTRAMUSCULAR | Status: AC
  Administered 2016-02-05: 125 mg via INTRAVENOUS

## 2016-02-05 MED ORDER — ACETAMINOPHEN 650 MG RE SUPP: 650.0000 mg | Freq: Four times a day (QID) | RECTAL | Status: DC | PRN

## 2016-02-05 MED ORDER — BENZONATATE 100 MG PO CAPS
200.0000 mg | ORAL_CAPSULE | Freq: Three times a day (TID) | ORAL | Status: DC | PRN
  Administered 2016-02-05 – 2016-02-08 (×5): 200 mg via ORAL
  Filled 2016-02-05 (×6): qty 2

## 2016-02-05 MED ORDER — DOCUSATE SODIUM 100 MG PO CAPS
100.0000 mg | ORAL_CAPSULE | Freq: Two times a day (BID) | ORAL | Status: DC
  Administered 2016-02-05 – 2016-02-08 (×6): 100 mg via ORAL
  Filled 2016-02-05 (×6): qty 1

## 2016-02-05 MED ORDER — ASPIRIN 81 MG PO CHEW
324.0000 mg | CHEWABLE_TABLET | Freq: Once | ORAL | Status: AC
  Administered 2016-02-05: 324 mg via ORAL
  Filled 2016-02-05: qty 4

## 2016-02-05 MED ORDER — ASPIRIN 81 MG PO CHEW
CHEWABLE_TABLET | ORAL | Status: AC
  Administered 2016-02-05: 324 mg via ORAL
  Filled 2016-02-05: qty 4

## 2016-02-05 MED ORDER — PANTOPRAZOLE SODIUM 40 MG PO TBEC
40.0000 mg | DELAYED_RELEASE_TABLET | Freq: Every day | ORAL | Status: DC
  Administered 2016-02-05 – 2016-02-08 (×4): 40 mg via ORAL
  Filled 2016-02-05 (×4): qty 1

## 2016-02-05 MED ORDER — IPRATROPIUM-ALBUTEROL 0.5-2.5 (3) MG/3ML IN SOLN
3.0000 mL | Freq: Four times a day (QID) | RESPIRATORY_TRACT | Status: DC
  Administered 2016-02-05 – 2016-02-08 (×10): 3 mL via RESPIRATORY_TRACT
  Filled 2016-02-05 (×10): qty 3

## 2016-02-05 MED ORDER — MORPHINE SULFATE (PF) 4 MG/ML IV SOLN
INTRAVENOUS | Status: AC
  Administered 2016-02-05: 4 mg via INTRAVENOUS
  Filled 2016-02-05: qty 1

## 2016-02-05 MED ORDER — METHYLPREDNISOLONE SODIUM SUCC 125 MG IJ SOLR
INTRAMUSCULAR | Status: AC
  Administered 2016-02-05: 125 mg via INTRAVENOUS
  Filled 2016-02-05: qty 2

## 2016-02-05 MED ORDER — LORAZEPAM 2 MG/ML IJ SOLN
INTRAMUSCULAR | Status: AC
  Filled 2016-02-05: qty 1

## 2016-02-05 MED ORDER — LEVOFLOXACIN IN D5W 750 MG/150ML IV SOLN
INTRAVENOUS | Status: AC
  Administered 2016-02-05: 750 mg via INTRAVENOUS
  Filled 2016-02-05: qty 150

## 2016-02-05 MED ORDER — ONDANSETRON HCL 4 MG/2ML IJ SOLN: 4.0000 mg | Freq: Four times a day (QID) | INTRAMUSCULAR | Status: DC | PRN

## 2016-02-05 MED ORDER — ALBUTEROL SULFATE (2.5 MG/3ML) 0.083% IN NEBU: 2.5000 mg | INHALATION_SOLUTION | RESPIRATORY_TRACT | Status: DC | PRN

## 2016-02-05 MED ORDER — IPRATROPIUM-ALBUTEROL 0.5-2.5 (3) MG/3ML IN SOLN
3.0000 mL | Freq: Once | RESPIRATORY_TRACT | Status: AC
  Administered 2016-02-05: 3 mL via RESPIRATORY_TRACT

## 2016-02-05 MED ORDER — MORPHINE SULFATE (PF) 4 MG/ML IV SOLN
4.0000 mg | Freq: Once | INTRAVENOUS | Status: AC
  Administered 2016-02-05: 4 mg via INTRAVENOUS

## 2016-02-05 MED ORDER — ALBUTEROL SULFATE (2.5 MG/3ML) 0.083% IN NEBU
2.5000 mg | INHALATION_SOLUTION | RESPIRATORY_TRACT | Status: DC | PRN
Start: 2016-02-05 — End: 2016-02-08
  Administered 2016-02-05: 2.5 mg via RESPIRATORY_TRACT
  Filled 2016-02-05: qty 3

## 2016-02-05 MED ORDER — ALPRAZOLAM 0.25 MG PO TABS
0.2500 mg | ORAL_TABLET | Freq: Two times a day (BID) | ORAL | Status: DC
  Administered 2016-02-05 – 2016-02-08 (×6): 0.25 mg via ORAL
  Filled 2016-02-05 (×6): qty 1

## 2016-02-05 MED ORDER — METHYLPREDNISOLONE SODIUM SUCC 125 MG IJ SOLR
60.0000 mg | Freq: Four times a day (QID) | INTRAMUSCULAR | Status: DC
  Administered 2016-02-05 – 2016-02-08 (×11): 60 mg via INTRAVENOUS
  Filled 2016-02-05 (×11): qty 2

## 2016-02-05 MED ORDER — LORAZEPAM 2 MG/ML IJ SOLN
0.5000 mg | Freq: Once | INTRAMUSCULAR | Status: AC
  Administered 2016-02-05: 0.5 mg via INTRAVENOUS

## 2016-02-05 MED ORDER — LORAZEPAM 2 MG/ML IJ SOLN
0.5000 mg | Freq: Once | INTRAMUSCULAR | Status: AC
  Administered 2016-02-05: 0.5 mg via INTRAVENOUS
  Filled 2016-02-05: qty 0.25

## 2016-02-05 MED ORDER — LEVOFLOXACIN IN D5W 500 MG/100ML IV SOLN
500.0000 mg | INTRAVENOUS | Status: DC
  Administered 2016-02-06 – 2016-02-07 (×2): 500 mg via INTRAVENOUS
  Filled 2016-02-05 (×3): qty 100

## 2016-02-05 NOTE — H&P (Signed)
[facility] - [place] at [facility]   PATIENT NAME: Caitlyn Evans    MR#:  [id number]  DATE OF BIRTH:  1966-08-20  DATE OF ADMISSION:  02/05/2016  PRIMARY CARE PHYSICIAN: Corky Downs, MD   REQUESTING/REFERRING PHYSICIAN: Minna Antis, MD  CHIEF COMPLAINT:   Cough and shortness of breath HISTORY OF PRESENT ILLNESS:  Caitlyn Evans  is a 50 y.o. female with a known history of COPD, BOOP, coronary artery disease, anxiety, hyperlipidemia and multiple other medical problems is presenting to the ED with a chief complaint of worsening of cough and shortness of breath associated with subjective fevers. Patient is reporting that her shortness of breath has been gradually getting worse for the past 2 months and is much worse for the past 2 days. Patient was seen by her primary care physician for intermittent episodes of fever associated with cough and has given 2 packs of azithromycin with no improvement. Patient had CAT scan of the chest performed 4 days ago which has revealed mucous plugging. Patient takes chronic prednisone but white count is in the normal range but today is at 23,000. Troponin is elevated. Patient is not feeling well and feeling very sick  PAST MEDICAL HISTORY:   Past Medical History  Diagnosis Date  . Migraines   . Hypercholesterolemia   . Seasonal allergies   . Emphysema lung (HCC) 05/30/2015  . Dysphagia, pharyngoesophageal phase 05/30/2015  . Lung mass   . Shortness of breath dyspnea     1 flight-stairs  . Anxiety     panic attacks  . Motion sickness     all moving vehicles  . Myocardial infarction (HCC) 2012  . BOOP (bronchiolitis obliterans with organizing pneumonia) (HCC)     PAST SURGICAL HISTOIRY:   Past Surgical History  Procedure Laterality Date  . Vaginal hysterectomy    . Lung surgery Left     Upper lobe removed  . Oophorectomy Left   . Colonoscopy with propofol N/A 06/02/2015    Procedure: COLONOSCOPY WITH PROPOFOL;   Surgeon: Midge Minium, MD;  Location: [facility];  Service: Endoscopy;  Laterality: N/A;  . Esophagogastroduodenoscopy (egd) with propofol N/A 06/02/2015    Procedure: ESOPHAGOGASTRODUODENOSCOPY (EGD) WITH PROPOFOL withdialation;  Surgeon: Midge Minium, MD;  Location: [facility];  Service: Endoscopy;  Laterality: N/A;    SOCIAL HISTORY:   Social History  Substance Use Topics  . Smoking status: Current Every Day Smoker -- 1.00 packs/day for 30 years  . Smokeless tobacco: Never Used     Comment: quit some time in 2012  . Alcohol Use: No     Comment: rare consumption    FAMILY HISTORY:   Family History  Problem Relation Age of Onset  . COPD Mother   . Heart attack Mother   . Stroke Father   . COPD Father     DRUG ALLERGIES:   Allergies  Allergen Reactions  . Penicillins Hives and Other (See Comments)    Has patient had a PCN reaction causing immediate rash, facial/tongue/throat swelling, SOB or lightheadedness with hypotension: No Has patient had a PCN reaction causing severe rash involving mucus membranes or skin necrosis: No Has patient had a PCN reaction that required hospitalization No Has patient had a PCN reaction occurring within the last 10 years: No If all of the above answers are "NO", then may proceed with Cephalosporin use.  . Codeine Hives and Nausea And Vomiting    REVIEW OF SYSTEMS:  CONSTITUTIONAL: Reporting fever, fatigue and  weakness. Feeling very sick EYES: No blurred or double vision.  EARS, NOSE, AND THROAT: No tinnitus or ear pain.  RESPIRATORY: Reporting cough, shortness of breath with minimal exertion, diffuse wheezing , no hemoptysis.  CARDIOVASCULAR: No chest pain, orthopnea, edema.  GASTROINTESTINAL: No nausea, vomiting, diarrhea or abdominal pain.  GENITOURINARY: No dysuria, hematuria.  ENDOCRINE: No polyuria, nocturia,  HEMATOLOGY: No anemia, easy bruising or bleeding SKIN: No rash or lesion. MUSCULOSKELETAL: No joint pain or  arthritis.   NEUROLOGIC: No tingling, numbness, weakness.  PSYCHIATRY: No anxiety or depression.   MEDICATIONS AT HOME:   Prior to Admission medications   Medication Sig Start Date End Date Taking? Authorizing Provider  ALPRAZolam (XANAX) 0.25 MG tablet Take 0.25 mg by mouth 2 (two) times daily.   Yes Historical Provider, MD  HYDROcodone-acetaminophen (NORCO) 10-325 MG tablet Take 0.5 tablets by mouth 2 (two) times daily as needed for moderate pain.    Yes Historical Provider, MD  ipratropium-albuterol (DUONEB) 0.5-2.5 (3) MG/3ML SOLN Inhale 3 mLs into the lungs every 4 (four) hours as needed (for wheezing/shortness of breath).    Yes Historical Provider, MD  Multiple Vitamins-Minerals (MULTIVITAMIN GUMMIES ADULT) CHEW Chew 1 each by mouth daily.   Yes Historical Provider, MD  pantoprazole (PROTONIX) 20 MG tablet Take 20 mg by mouth daily.   Yes Historical Provider, MD  predniSONE (DELTASONE) 20 MG tablet Take 20 mg by mouth daily with breakfast.   Yes Historical Provider, MD  Simethicone (ALKA-SELTZER ANTI-GAS PO) Take 2 each by mouth as needed (for indigestion/heartburn).   Yes Historical Provider, MD  zolpidem (AMBIEN) 10 MG tablet Take 10 mg by mouth at bedtime.   Yes Historical Provider, MD  benzonatate (TESSALON) 200 MG capsule Take 1 capsule (200 mg total) by mouth 3 (three) times daily as needed for cough. Patient not taking: Reported on 02/05/2016 12/09/15   Wyatt Hasteavid K Hower, MD  oseltamivir (TAMIFLU) 75 MG capsule Take 1 capsule (75 mg total) by mouth 2 (two) times daily. Patient not taking: Reported on 02/05/2016 12/09/15   Wyatt Hasteavid K Hower, MD  predniSONE (STERAPRED UNI-PAK 21 TAB) 10 MG (21) TBPK tablet Take 1 tablet (10 mg total) by mouth daily. 40mg  oral 1 day, then 20mg  oral for 2 days, then 10mg  oral 2 days, then stop Patient not taking: Reported on 02/05/2016 12/09/15   Wyatt Hasteavid K Hower, MD      VITAL SIGNS:  Blood pressure 122/77, pulse 100, resp. rate 32, SpO2 100 %.  PHYSICAL  EXAMINATION:  GENERAL:  50 y.o.-year-old patient lying in the bed with no acute distress.  EYES: Pupils equal, round, reactive to light and accommodation. No scleral icterus. Extraocular muscles intact.  HEENT: Head atraumatic, normocephalic. Oropharynx and nasopharynx clear.  NECK:  Supple, no jugular venous distention. No thyroid enlargement, no tenderness.  LUNGS: Diffuse coarse bronchial breath sounds bilaterally, diffuse wheezing, with rales,rhonchi or crepitation.  some use of accessory muscles of respiration.  CARDIOVASCULAR: S1, S2 normal. No murmurs, rubs, or gallops.  ABDOMEN: Soft, nontender, nondistended. Bowel sounds present. No organomegaly or mass.  EXTREMITIES: No pedal edema, cyanosis, or clubbing.  NEUROLOGIC: Cranial nerves II through XII are intact. Muscle strength 5/5 in all extremities. Sensation intact. Gait not checked.  PSYCHIATRIC: The patient is alert and oriented x 3.  SKIN: No obvious rash, lesion, or ulcer.   LABORATORY PANEL:   CBC  Recent Labs Lab 02/05/16 1010  WBC 21.4*  HGB 13.1  HCT 39.8  PLT 298   ------------------------------------------------------------------------------------------------------------------  Chemistries   Recent Labs Lab 02/05/16 1010  NA 142  K 4.1  CL 109  CO2 26  GLUCOSE 95  BUN 16  CREATININE 0.66  CALCIUM 8.9   ------------------------------------------------------------------------------------------------------------------  Cardiac Enzymes  Recent Labs Lab 02/05/16 1010  TROPONINI 0.06*   ------------------------------------------------------------------------------------------------------------------  RADIOLOGY:  Dg Chest 2 View  02/05/2016  CLINICAL DATA:  50 year old female with 1 week history of productive cough EXAM: CHEST  2 VIEW COMPARISON:  Recent chest CT 01/31/2016; prior chest x-ray 12/05/2015 FINDINGS: Chain sutures noted in the left upper lobe consistent with left upper lobe wedge  resection. Biapical pleural parenchymal scarring greater on the right than the left is similar compared to prior imaging. Stable hyperinflation with mild central airway thickening and interstitial prominence. No new focal airspace consolidation, pulmonary edema, pleural effusion or pneumothorax. Cardiac and mediastinal contours remain within normal limits. Atherosclerotic calcifications in the transverse aorta. No acute osseous abnormality. IMPRESSION: Stable chest x-ray without evidence of acute cardiopulmonary process. Electronically Signed   By: Malachy Moan M.D.   On: 02/05/2016 10:33    EKG:   Orders placed or performed during the hospital encounter of 02/05/16  . ED EKG  . ED EKG    IMPRESSION AND PLAN:   Ahsley Attwood  is a 50 y.o. female with a known history of COPD, BOOP, coronary artery disease, anxiety, hyperlipidemia and multiple other medical problems is presenting to the ED with a chief complaint of worsening of cough and shortness of breath associated with subjective fevers. Patient is reporting that her shortness of breath has been gradually getting worse for the past 2 months and is much worse for the past 2 days. Patient was seen by her primary care physician for intermittent episodes of fever associated with cough and has given 2 packs of azithromycin with no improvement. Patient had CAT scan of the chest performed 4 days ago which has revealed mucous plugging  #Acute COPD exacerbation with acute bronchitis and underlying BOOP We'll give IV Solu-Medrol, DUOneb nebulizer treatments IV levofloxacin, patient failed outpatient azithromycin 2 Pulmonology consult BiPAP as needed   #Sepsis with fever, leukocytosis and tachycardia secondary to acute bronchitis Patient failed outpatient azithromycin. We will provide her IV levofloxacin   #Elevated troponin probably from demand ischemia Will continue close monitoring of the patient on telemetry and cycle cardiac  biomarkers.  #Essential hypertension continue her home medication and titrate as needed basis  #Generalized anxiety providing Xanax as needed  #Hyperlipidemia continue statin   #Tobacco abuse disorder Counselled patient to quit smoking completely for 3-5 minutes. We will provide nicotine patch.   Provide GI and DVT prophylaxis  All the records are reviewed and case discussed with ED provider. Management plans discussed with the patient, family and they are in agreement.  CODE STATUS: fc/Husband is the healthcare power of attorney  TOTAL CRITICAL CARE TIME TAKING CARE OF THIS PATIENT: 50 minutes.    Ramonita Lab M.D on 02/05/2016 at 5:37 PM  Between 7am to 6pm - Pager - [phone]  After 6pm go to [email or web address] - password [email or web address] [facility]  [place] [facility]  Office  [phone]  CC: Primary care physician; Corky Downs, MD

## 2016-02-05 NOTE — Progress Notes (Signed)
Pharmacy Antibiotic Note  Caitlyn Evans is a 50 y.o. female admitted on 02/05/2016 with COPD exacerbation.  Pharmacy has been consulted for levofloxacin dosing.  Plan: Patient received levofloxacin 750 mg x1 dose in ED. Will order levofloxacin 500 mg IV daily beginning tomorrow  Height: 4\' 11"  (149.9 cm) Weight: 115 lb 1.3 oz (52.2 kg) IBW/kg (Calculated) : 43.2  Temp (24hrs), Avg:97.2 F (36.2 C), Min:97.2 F (36.2 C), Max:97.2 F (36.2 C)   Recent Labs Lab 02/05/16 1010  WBC 21.4*  CREATININE 0.66    Estimated Creatinine Clearance: 60.8 mL/min (by C-G formula based on Cr of 0.66).    Allergies  Allergen Reactions  . Penicillins Hives and Other (See Comments)    Has patient had a PCN reaction causing immediate rash, facial/tongue/throat swelling, SOB or lightheadedness with hypotension: No Has patient had a PCN reaction causing severe rash involving mucus membranes or skin necrosis: No Has patient had a PCN reaction that required hospitalization No Has patient had a PCN reaction occurring within the last 10 years: No If all of the above answers are "NO", then may proceed with Cephalosporin use.  . Codeine Hives and Nausea And Vomiting   Antimicrobials this admission: levofloxacin 5/22 >>   Dose adjustments this admission:  Microbiology results: 5/22 BCx: Sent 5/22 UCx: Sent   Thank you for allowing pharmacy to be a part of this patient's care.  Cindi CarbonMary M Lucetta Baehr, PharmD Clinical Pharmacist 02/05/2016 7:27 PM

## 2016-02-05 NOTE — ED Provider Notes (Signed)
[facility] Emergency Department Provider Note  Time seen: 2:27 PM  I have reviewed the triage vital signs and the nursing notes.   HISTORY  Chief Complaint Cough and Muscle Pain    HPI Caitlyn Evans is a 50 y.o. female with a past medical history of COPD, BOOP, MI, anxiety, hyperlipidemia, who presents the emergency department with difficulty breathing, cough and subjective fevers. According to the patient for the past 2 months she has been feeling increased shortness of breath, increased cough and increased weakness. Patient states over the past one month her symptoms have been much worse. She has intermittently had fever, she has seen her doctor several times over the past 1 month, has had 2 rounds of antibiotics without improvement. She states over the past 4 days her symptoms have significantly worsened. She had a CT scan performed approximately 4 days ago for the same. Patient is on chronic prednisone but has had no changes in her prednisone dosing. Patient states she has taken at least 6 breathing treatments at home today, continues to feel significantly short of breath so she came to the emergency department for evaluation. States she is having some chest pain, which she relates to the amount of coughing.     Past Medical History  Diagnosis Date  . Migraines   . Hypercholesterolemia   . Seasonal allergies   . Emphysema lung (HCC) 05/30/2015  . Dysphagia, pharyngoesophageal phase 05/30/2015  . Lung mass   . Shortness of breath dyspnea     1 flight-stairs  . Anxiety     panic attacks  . Motion sickness     all moving vehicles  . Myocardial infarction (HCC) 2012  . BOOP (bronchiolitis obliterans with organizing pneumonia) [facility])     Patient Active Problem List   Diagnosis Date Noted  . Acute exacerbation of chronic obstructive pulmonary disease (COPD) (HCC)   . Acute respiratory failure (HCC)   . Acute respiratory failure with hypoxia (HCC)  12/05/2015  . BOOP (bronchiolitis obliterans with organizing pneumonia) (HCC) 12/05/2015  . Depression, major, single episode, moderate (HCC) 07/14/2015  . Adjustment disorder with anxiety 07/14/2015  . COPD exacerbation (HCC) 07/14/2015  . Special screening for malignant neoplasms, colon   . Swallowing difficulty   . Loss of weight   . Esophagogastric ulcer   . Esophageal ulcer   . Hypertension 05/30/2015  . Emphysema lung (HCC) 05/30/2015  . Dysphagia, pharyngoesophageal phase 05/30/2015    Past Surgical History  Procedure Laterality Date  . Vaginal hysterectomy    . Lung surgery Left     Upper lobe removed  . Oophorectomy Left   . Colonoscopy with propofol N/A 06/02/2015    Procedure: COLONOSCOPY WITH PROPOFOL;  Surgeon: Midge Minium, MD;  Location: [facility];  Service: Endoscopy;  Laterality: N/A;  . Esophagogastroduodenoscopy (egd) with propofol N/A 06/02/2015    Procedure: ESOPHAGOGASTRODUODENOSCOPY (EGD) WITH PROPOFOL withdialation;  Surgeon: Midge Minium, MD;  Location: [facility];  Service: Endoscopy;  Laterality: N/A;    Current Outpatient Rx  Name  Route  Sig  Dispense  Refill  . ALPRAZolam (XANAX) 0.25 MG tablet   Oral   Take 0.25 mg by mouth 2 (two) times daily.         . benzonatate (TESSALON) 200 MG capsule   Oral   Take 1 capsule (200 mg total) by mouth 3 (three) times daily as needed for cough.   20 capsule   0   . HYDROcodone-acetaminophen (NORCO)  10-325 MG tablet   Oral   Take 0.5 tablets by mouth 2 (two) times daily.         Marland Kitchen ipratropium-albuterol (DUONEB) 0.5-2.5 (3) MG/3ML SOLN   Inhalation   Inhale 3 mLs into the lungs every 4 (four) hours as needed (for wheezing/shortness of breath).          Marland Kitchen oseltamivir (TAMIFLU) 75 MG capsule   Oral   Take 1 capsule (75 mg total) by mouth 2 (two) times daily.   8 capsule   0   . predniSONE (STERAPRED UNI-PAK 21 TAB) 10 MG (21) TBPK tablet   Oral   Take 1 tablet (10 mg total) by  mouth daily. 40mg  oral 1 day, then 20mg  oral for 2 days, then 10mg  oral 2 days, then stop   10 tablet   0   . zolpidem (AMBIEN) 10 MG tablet   Oral   Take 10 mg by mouth at bedtime.           Allergies Penicillins and Codeine  Family History  Problem Relation Age of Onset  . COPD Mother   . Heart attack Mother   . Stroke Father   . COPD Father     Social History Social History  Substance Use Topics  . Smoking status: Current Every Day Smoker -- 1.00 packs/day for 30 years  . Smokeless tobacco: Never Used     Comment: quit some time in 2012  . Alcohol Use: No     Comment: rare consumption    Review of Systems Constitutional: Negative for fever. Cardiovascular: Mild chest pain, worse with cough. Respiratory: Moderate shortness of breath Gastrointestinal: Negative for abdominal pain Musculoskeletal: Negative for back pain. Neurological: Negative for headache 10-point ROS otherwise negative.  ____________________________________________   PHYSICAL EXAM:  Constitutional: Alert and oriented. Well appearing and in no distress. Eyes: Normal exam ENT   Head: Normocephalic and atraumatic.   Mouth/Throat: Mucous membranes are moist. Cardiovascular: Normal rate, regular rhythm. No murmur Respiratory: Mild to moderate tachypnea, talks in 2-3 word sentences. Significant expiratory wheeze in all lung fields. Gastrointestinal: Soft and nontender. No distention.   Musculoskeletal: Nontender with normal range of motion in all extremities. No lower extremity edema or tenderness. Neurologic:  Normal language. No gross focal neurologic deficits. Skin:  Skin is warm, dry and intact.  Psychiatric: Mood and affect are normal.   ____________________________________________    EKG  EKG reviewed and interpreted by myself shows sinus tachycardia 102 bpm, narrow QRS, normal axis, normal intervals, no concerning ST changes  identified.  ____________________________________________    RADIOLOGY  Stable chest x-ray.   INITIAL IMPRESSION / ASSESSMENT AND PLAN / ED COURSE  Pertinent labs & imaging results that were available during my care of the patient were reviewed by me and considered in my medical decision making (see chart for details).  Patient presents with 2 months of worsening shortness of breath, now with chest pain and 4 days of significant shortness breath. Patient has a white blood cell count of 21,000, neutrophil predominant. Patient has a CT scan performed 4 days ago showing mucoid plugging. Patient states subjective fevers at home, afebrile in the emergency department. Patient has significant wheeze in all lung fields. Patient has mild to moderate tachypnea, talks in short sentences due to shortness of breath. Exam most consistent with significant COPD exacerbation. We will dose DuoNeb's, Solu-Medrol. Patient states she has taken at least 6 breathing treatments today, along with 2 outpatient courses of antibiotics. We will  dose IV Solu-Medrol, IV Levaquin, check blood cultures and likely admitted to the hospital for COPD exacerbation.  Patient's troponin is slightly elevated 0.06, normal creatinine. EKG is reassuring, no concerning findings. Patient continues with significant wheeze on exam. I have dosed aspirin, we'll admit to the hospital for likely COPD exacerbation.  ____________________________________________   FINAL CLINICAL IMPRESSION(S) / ED DIAGNOSES  COPD exacerbation   Minna AntisKevin Dnyla Antonetti, MD 02/05/16 1510

## 2016-02-05 NOTE — ED Notes (Signed)
See triage on paper.  Pt with cough--feelingin bad--for 2 months since she had flu

## 2016-02-05 NOTE — ED Notes (Signed)
Pt has some improvement, but continues to be tachypnea, admitted MD notifed of ABG results.. Pt given ativan to help relieve some anxiety to assist with breathing..Marland Kitchen

## 2016-02-06 DIAGNOSIS — J441 Chronic obstructive pulmonary disease with (acute) exacerbation: Secondary | ICD-10-CM

## 2016-02-06 LAB — COMPREHENSIVE METABOLIC PANEL
ALT: 16 U/L (ref 14–54)
AST: 18 U/L (ref 15–41)
Albumin: 3.5 g/dL (ref 3.5–5.0)
Alkaline Phosphatase: 62 U/L (ref 38–126)
Anion gap: 7 (ref 5–15)
BUN: 20 mg/dL (ref 6–20)
CO2: 27 mmol/L (ref 22–32)
Calcium: 8.8 mg/dL — ABNORMAL LOW (ref 8.9–10.3)
Chloride: 105 mmol/L (ref 101–111)
Creatinine, Ser: 0.67 mg/dL (ref 0.44–1.00)
GFR calc Af Amer: >60 mL/min (ref >60)
GFR calc non Af Amer: >60 mL/min (ref >60)
Glucose, Bld: 141 mg/dL — ABNORMAL HIGH (ref 65–99)
Potassium: 3.9 mmol/L (ref 3.5–5.1)
Sodium: 139 mmol/L (ref 135–145)
Total Bilirubin: 0.4 mg/dL (ref 0.3–1.2)
Total Protein: 6.5 g/dL (ref 6.5–8.1)

## 2016-02-06 LAB — CBC
HCT: 36.6 % (ref 35.0–47.0)
Hemoglobin: 12 g/dL (ref 12.0–16.0)
MCH: 28.2 pg (ref 26.0–34.0)
MCHC: 32.7 g/dL (ref 32.0–36.0)
MCV: 86.3 fL (ref 80.0–100.0)
PLATELETS: 273 10*3/uL (ref 150–440)
RBC: 4.24 MIL/uL (ref 3.80–5.20)
RDW: 18 % — AB (ref 11.5–14.5)
WBC: 13.6 10*3/uL — AB (ref 3.6–11.0)

## 2016-02-06 LAB — TROPONIN I
Troponin I: <0.03 ng/mL (ref <0.031)
Troponin I: 0.06 ng/mL — ABNORMAL HIGH (ref <0.031)

## 2016-02-06 LAB — MRSA PCR SCREENING: MRSA by PCR: NEGATIVE

## 2016-02-06 LAB — GLUCOSE, CAPILLARY: Glucose-Capillary: 119 mg/dL — ABNORMAL HIGH (ref 65–99)

## 2016-02-06 MED ORDER — LORAZEPAM 2 MG/ML IJ SOLN
1.0000 mg | Freq: Four times a day (QID) | INTRAMUSCULAR | Status: DC | PRN
  Administered 2016-02-06 – 2016-02-07 (×5): 1 mg via INTRAVENOUS
  Filled 2016-02-06 (×5): qty 1

## 2016-02-06 MED ORDER — HYDROCODONE-ACETAMINOPHEN 10-325 MG PO TABS
1.0000 | ORAL_TABLET | Freq: Four times a day (QID) | ORAL | Status: DC | PRN
Start: 2016-02-06 — End: 2016-02-06
  Administered 2016-02-06: 1 via ORAL

## 2016-02-06 MED ORDER — HYDROCODONE-ACETAMINOPHEN 10-325 MG PO TABS
1.0000 | ORAL_TABLET | ORAL | Status: DC | PRN
  Administered 2016-02-07 – 2016-02-08 (×4): 1 via ORAL
  Filled 2016-02-06 (×4): qty 1

## 2016-02-06 MED ORDER — MENTHOL 3 MG MT LOZG
1.0000 | LOZENGE | Freq: Four times a day (QID) | OROMUCOSAL | Status: DC | PRN
  Filled 2016-02-06: qty 9

## 2016-02-06 MED ORDER — GUAIFENESIN-DM 100-10 MG/5ML PO SYRP
5.0000 mL | ORAL_SOLUTION | ORAL | Status: DC | PRN
  Administered 2016-02-06: 5 mL via ORAL
  Filled 2016-02-06: qty 5

## 2016-02-06 MED ORDER — MORPHINE SULFATE (PF) 2 MG/ML IV SOLN
2.0000 mg | INTRAVENOUS | Status: DC | PRN
  Administered 2016-02-06 – 2016-02-07 (×4): 2 mg via INTRAVENOUS
  Filled 2016-02-06 (×4): qty 1

## 2016-02-06 NOTE — Progress Notes (Signed)
Pharmacy Antibiotic Note  Caitlyn Evans is a 50 y.o. female admitted on 02/05/2016 with COPD exacerbation.  Pharmacy has been consulted for levofloxacin dosing.  Plan: Will continue levofloxacin 500 mg IV q24 hours.   Height: 4\' 11"  (149.9 cm) Weight: 115 lb 1.3 oz (52.2 kg) IBW/kg (Calculated) : 43.2  Temp (24hrs), Avg:97.7 F (36.5 C), Min:97.2 F (36.2 C), Max:98.2 F (36.8 C)   Recent Labs Lab 02/05/16 1010 02/06/16 0119  WBC 21.4* 13.6*  CREATININE 0.66 0.67    Estimated Creatinine Clearance: 60.8 mL/min (by C-G formula based on Cr of 0.67).    Allergies  Allergen Reactions  . Penicillins Hives and Other (See Comments)    Has patient had a PCN reaction causing immediate rash, facial/tongue/throat swelling, SOB or lightheadedness with hypotension: No Has patient had a PCN reaction causing severe rash involving mucus membranes or skin necrosis: No Has patient had a PCN reaction that required hospitalization No Has patient had a PCN reaction occurring within the last 10 years: No If all of the above answers are "NO", then may proceed with Cephalosporin use.  . Codeine Hives and Nausea And Vomiting   Antimicrobials this admission: levofloxacin 5/22 >>   Dose adjustments this admission:  Microbiology results: 5/22 BCx: Sent 5/22 UCx: Sent   Thank you for allowing pharmacy to be a part of this patient's care.  Demetrius CharityJames,Miliano Cotten D, PharmD Clinical Pharmacist 02/06/2016 9:08 AM

## 2016-02-06 NOTE — Progress Notes (Signed)
Patient experiencing increased WOB and anxiety. Ativan given. Some relief, but where's off fast (per pt). E-link notified for increase in dosing. No new orders given.

## 2016-02-06 NOTE — Progress Notes (Signed)
eLink Physician-Brief Progress Note Patient Name: Caitlyn Evans DOB: 08-19-1966 MRN: [id number]   Date of Service  02/06/2016  HPI/Events of Note    eICU Interventions  Increase norco 1 tab q 6h prn     Intervention Category Intermediate Interventions: Pain - evaluation and management  Safina Huard V. 02/06/2016, 6:03 AM

## 2016-02-06 NOTE — Plan of Care (Signed)
Problem: Discharge Progression Outcomes Goal: Dyspnea controlled Outcome: Progressing Pt transferred today from ICU. VSS. Dyspnea with exertion and with coughing spells. Robitussin given for cough with improvement. Morphine given with improvement for headache and intermittent mid chest pain that occurs when coughing. Ativan given once for anxiety with good effect. Pt up independently to bsc.

## 2016-02-06 NOTE — Progress Notes (Signed)
Patient moved to room 107 by wheelchair with RN.

## 2016-02-06 NOTE — Consult Note (Signed)
PULMONARY / CRITICAL CARE MEDICINE   Name: Caitlyn Evans MRN: [id number] DOB: 02/18/1966    ADMISSION DATE:  02/05/2016 CONSULTATION DATE: 02/06/16  REFERRING MD:  Patel,S  CHIEF COMPLAINT: Shortness of breath HISTORY OF PRESENT ILLNESS:   Caitlyn Evans is a 50 years old female with past medical history significant for BOOP,, emphysema, hypertension, anxiety, MI. Patient presented to ED with complaints of worsening cough and shortness of breath associated with fevers. Patient states that she has been coughing up green, small productive sputum. Patient was seen by her primary care physician and was given 2 packs of as azithromycin with no improvement. Patient was admitted to the intensive care unit with COPD exacerbation along with acute bronchitis, and underlying BOOP. [facility] M team was consulted on 5/23 for further management.  PAST MEDICAL HISTORY :  She  has a past medical history of Migraines; Hypercholesterolemia; Seasonal allergies; Emphysema lung (HCC) (05/30/2015); Dysphagia, pharyngoesophageal phase (05/30/2015); Lung mass; Shortness of breath dyspnea; Anxiety; Motion sickness; Myocardial infarction [facility]) (2012); and BOOP (bronchiolitis obliterans with organizing pneumonia) (HCC).  PAST SURGICAL HISTORY: She  has past surgical history that includes Vaginal hysterectomy; Lung surgery (Left); Oophorectomy (Left); Colonoscopy with propofol (N/A, 06/02/2015); and Esophagogastroduodenoscopy (egd) with propofol (N/A, 06/02/2015).  Allergies  Allergen Reactions  . Penicillins Hives and Other (See Comments)    Has patient had a PCN reaction causing immediate rash, facial/tongue/throat swelling, SOB or lightheadedness with hypotension: No Has patient had a PCN reaction causing severe rash involving mucus membranes or skin necrosis: No Has patient had a PCN reaction that required hospitalization No Has patient had a PCN reaction occurring within the last 10 years: No If all of the above answers  are "NO", then may proceed with Cephalosporin use.  . Codeine Hives and Nausea And Vomiting    No current facility-administered medications on file prior to encounter.   Current Outpatient Prescriptions on File Prior to Encounter  Medication Sig  . ALPRAZolam (XANAX) 0.25 MG tablet Take 0.25 mg by mouth 2 (two) times daily.  Marland Kitchen HYDROcodone-acetaminophen (NORCO) 10-325 MG tablet Take 0.5 tablets by mouth 2 (two) times daily as needed for moderate pain.   Marland Kitchen ipratropium-albuterol (DUONEB) 0.5-2.5 (3) MG/3ML SOLN Inhale 3 mLs into the lungs every 4 (four) hours as needed (for wheezing/shortness of breath).   . zolpidem (AMBIEN) 10 MG tablet Take 10 mg by mouth at bedtime.  . benzonatate (TESSALON) 200 MG capsule Take 1 capsule (200 mg total) by mouth 3 (three) times daily as needed for cough. (Patient not taking: Reported on 02/05/2016)  . oseltamivir (TAMIFLU) 75 MG capsule Take 1 capsule (75 mg total) by mouth 2 (two) times daily. (Patient not taking: Reported on 02/05/2016)  . predniSONE (STERAPRED UNI-PAK 21 TAB) 10 MG (21) TBPK tablet Take 1 tablet (10 mg total) by mouth daily. 40mg  oral 1 day, then 20mg  oral for 2 days, then 10mg  oral 2 days, then stop (Patient not taking: Reported on 02/05/2016)    FAMILY HISTORY:  Her indicated that her mother is deceased. She indicated that her father is alive.   SOCIAL HISTORY: She  reports that she has been smoking.  She has never used smokeless tobacco. She reports that she does not drink alcohol or use illicit drugs.  REVIEW OF SYSTEMS:   All negative; except for those that are bolded, which indicate positives.  Constitutional: weight loss,  NEweight gain, night sweats, fevers, chills, fatigue, weakness.  HEENT: headaches, sore throat, sneezing, nasal congestion, post nasal  drip, difficulty swallowing, tooth/dental problems, visual complaints, visual changes, ear aches. Neuro: difficulty with speech, weakness, numbness, ataxia. CV:  chest pain,  orthopnea, PND, swelling in lower extremities, dizziness, palpitations, syncope.  Resp: cough, hemoptysis, dyspnea, wheezing. GI  heartburn, indigestion, abdominal pain, nausea, vomiting, diarrhea, constipation, change in bowel habits, loss of appetite, hematemesis, melena, hematochezia.  GU: dysuria, change in color of urine, urgency or frequency, flank pain, hematuria. MSK: joint pain or swelling, decreased range of motion. Psych: change in mood or affect, depression, anxiety, suicidal ideations, homicidal ideations. Skin: rash, itching, bruising.   SUBJECTIVE:  Patient states that she has not been feeling well over 2 months and not getting her medications to keep her calm. She is not getting followed with the pulmonologist on a routine basis VITAL SIGNS: BP 124/67 mmHg  Pulse 81  Temp(Src) 98.2 F (36.8 C) (Oral)  Resp 16  Ht  (1.499 m)  Wt 52.2 kg (115 lb 1.3 oz)  BMI 23.23 kg/m2  SpO2 98%  HEMODYNAMICS:    VENTILATOR SETTINGS: Vent Mode:  [-]  FiO2 (%):  [21 %] 21 %  INTAKE / OUTPUT: I/O last 3 completed shifts: In: 360 [P.O.:360] Out: 600 [Urine:600]  PHYSICAL EXAMINATION: General:   white female found on room air, in no acute distress Neuro:  awake, alert, oriented, no focal disc deficits HEENT:   Atraumatic, normocephalic, no discharge, no JVD appreciated Cardiovascular: S1 and S2, regular rate and rhythm, no MRG noted Abdomen:  soft, nontender Musculoskeletal:  no inflammation, deformity noted Skin: grossly intact  LABS:  BMET  Recent Labs Lab 02/05/16 1010 02/06/16 0119  NA 142 139  K 4.1 3.9  CL 109 105  CO2 26 27  BUN 16 20  CREATININE 0.66 0.67  GLUCOSE 95 141*    Electrolytes  Recent Labs Lab 02/05/16 1010 02/06/16 0119  CALCIUM 8.9 8.8*    CBC  Recent Labs Lab 02/05/16 1010 02/06/16 0119  WBC 21.4* 13.6*  HGB 13.1 12.0  HCT 39.8 36.6  PLT 298 273    Coag's No results for input(s): APTT, INR in the last 168  hours.  Sepsis Markers No results for input(s): LATICACIDVEN, PROCALCITON, O2SATVEN in the last 168 hours.  ABG  Recent Labs Lab 02/05/16 1548  PHART 7.61*  PCO2ART 25*  PO2ART 206*    Liver Enzymes  Recent Labs Lab 02/06/16 0119  AST 18  ALT 16  ALKPHOS 62  BILITOT 0.4  ALBUMIN 3.5    Cardiac Enzymes  Recent Labs Lab 02/05/16 1958 02/06/16 0119 02/06/16 0720  TROPONINI 0.04* <0.03 0.06*    Glucose No results for input(s): GLUCAP in the last 168 hours.  Imaging No results found.   STUDIES:  5/17 CT chest>>1. Airway thickening and scattered mucoid impaction. No focalfinding to explain hemoptysis.. Biapical pleural based scarring. Remote left upper lobe surgery. . Atherosclerosis, including the coronary arteries.   CULTURES:  5/22 blood cultures>>  ANTIBIOTICS:  5/22 levofloxacin>> SIGNIFICANT EVENTS: None  LINES/TUBES:  none  DISCUSSION:  50 years old female with history of COPD, BOOP, CAD, emphysema hypertension, left open upper lobe surgery now presenting with COPD exacerbation with bronchiolitis.  ASSESSMENT / PLAN:  PULMONARY A:  COPD exacerbation with bronchiolitis History of BOOP  tobacco abuse History of Emphysema  P:   Agree with levofloxacin continue albuterol/DuoNeb Continue Tessalon Continue Solu-Medrol Flutter valve/incentive spirometer Robitussin-DM CXR in a.m. CARDIOVASCULAR A:  History of CAD, hypertension Elevated troponins P:  Management per primary trend troponin  RENAL A:   No active issues P:   Follow chemistry GASTROINTESTINAL A:    no active issues  P:   heart healthy diet Continue Protonix   HEMATOLOGIC A:    no active issues P:  l SCDs  INFECTIOUS A:   Leukocytosis related to bronchiolitis P:    continue antibiotics Follow cultures  ENDOCRINE A:    no active issues P:    blood sugar checks intermittently  NEUROLOGIC A:    generalized anxiety Chronic pain Insomnia P:    RASS goal: 0 Ativan when necessary Morphine Continue Ambien at night  FAMILY  - Updates:  no family present at the bedside    Bincy Varughese,AG-ACNP Pulmonary and Critical Care Medicine [facility] Pager: [phone]  02/06/2016, 11:28 AM  Patient was seen and examined with nurse practitioner, agree with assessment and plan. The patient is a 50 year old female with known history of COPD, she was recently admitted to the hospital in March of this year with severe COPD exacerbation. Upon discharge. At that time she was asked to follow up with us in the pulmonary clinic, however, she did not follow-up after that. Currently, she is admitted once again with a COPD exacerbation. She notes that she continues to smoke occasionally on a "blue moon". However, the last blue moon was apparently yesterday. Currently, she notes that she is feeling much better than previous. She says that she continues to have problems with severe anxiety, and keeping this controlled. Vitals currently show oxygen saturation of 98% on room air. Lungs are clear to auscultation, with decreased air entry bilaterally. The patient was again given my card, and asked to follow up with us once again. She can be weaned to a oral steroid taper, and a course of antibiotics.  Wells Guileseep Kaylob Wallen M.D. 02/06/2016

## 2016-02-06 NOTE — Progress Notes (Signed)
[facility] - [place] at [facility]                                                                                                                                                                                            Patient Demographics   Caitlyn Evans, is a 50 y.o. female, DOB - May 13, 1966, ZOX:[id number]  Admit date - 02/05/2016   Admitting Physician Ramonita Lab, MD  Outpatient Primary MD for the patient is MASOUD,JAVED, MD   LOS - 1  Subjective: Patient complains of shortness of breath and cough. Also complains of chest pain back pain.     Review of Systems:   CONSTITUTIONAL: No documented fever. No fatigue, weakness. No weight gain, no weight loss.  EYES: No blurry or double vision.  ENT: No tinnitus. No postnasal drip. No redness of the oropharynx.  RESPIRATORY: Positive cough, no wheeze, no hemoptysis. Positive dyspnea.  CARDIOVASCULAR: No chest pain. No orthopnea. No palpitations. No syncope.  GASTROINTESTINAL: No nausea, no vomiting or diarrhea. No abdominal pain. No melena or hematochezia.  GENITOURINARY: No dysuria or hematuria.  ENDOCRINE: No polyuria or nocturia. No heat or cold intolerance.  HEMATOLOGY: No anemia. No bruising. No bleeding.  INTEGUMENTARY: No rashes. No lesions.  MUSCULOSKELETAL: No arthritis. No swelling. No gout.  NEUROLOGIC: No numbness, tingling, or ataxia. No seizure-type activity.  PSYCHIATRIC: No anxiety. No insomnia. No ADD.    Vitals:   Filed Vitals:   02/06/16 1100 02/06/16 1200 02/06/16 1300 02/06/16 1330  BP: 124/67 118/66 107/57 137/61  Pulse: 81 78 81 95  Temp:   98.1 F (36.7 C) 98.2 F (36.8 C)  TempSrc:   Oral Oral  Resp: 16 24 23 24   Height:      Weight:      SpO2: 98% 95% 95% 98%    Wt Readings from Last 3 Encounters:  02/05/16 52.2 kg (115 lb 1.3 oz)  12/05/15 52.164 kg (115 lb)  07/14/15 47.628 kg (105 lb)     Intake/Output Summary (Last 24 hours) at 02/06/16 1336 Last data filed  at 02/06/16 0900  Gross per 24 hour  Intake    600 ml  Output    600 ml  Net      0 ml    Physical Exam:   GENERAL: Anxious appearing  HEAD, EYES, EARS, NOSE AND THROAT: Atraumatic, normocephalic. Extraocular muscles are intact. Pupils equal and reactive to light. Sclerae anicteric. No conjunctival injection. No oro-pharyngeal erythema.  NECK: Supple. There is no jugular venous distention. No bruits, no lymphadenopathy, no thyromegaly.  HEART: Regular rate and rhythm,. No murmurs, no rubs, no  clicks.  LUNGS: Decreased breath sounds bilaterally without any rales rhonchi wheezing  ABDOMEN: Soft, flat, nontender, nondistended. Has good bowel sounds. No hepatosplenomegaly appreciated.  EXTREMITIES: No evidence of any cyanosis, clubbing, or peripheral edema.  +2 pedal and radial pulses bilaterally.  NEUROLOGIC: The patient is alert, awake, and oriented x3 with no focal motor or sensory deficits appreciated bilaterally.  SKIN: Moist and warm with no rashes appreciated.  Psych: Not anxious, depressed LN: No inguinal LN enlargement    Antibiotics   Anti-infectives    Start     Dose/Rate Route Frequency Ordered Stop   02/06/16 1400  levofloxacin (LEVAQUIN) IVPB 500 mg     500 mg 100 mL/hr over 60 Minutes Intravenous Every 24 hours 02/05/16 1927 02/12/16 2359   02/05/16 1430  levofloxacin (LEVAQUIN) IVPB 750 mg     750 mg 100 mL/hr over 90 Minutes Intravenous  Once 02/05/16 1416 02/05/16 1627      Medications   Scheduled Meds: . ALPRAZolam  0.25 mg Oral BID  . aspirin EC  325 mg Oral Daily  . docusate sodium  100 mg Oral BID  . enoxaparin (LOVENOX) injection  40 mg Subcutaneous Q24H  . ipratropium-albuterol  3 mL Nebulization Q6H  . levofloxacin (LEVAQUIN) IV  500 mg Intravenous Q24H  . methylPREDNISolone (SOLU-MEDROL) injection  60 mg Intravenous Q6H  . multivitamin with minerals  1 tablet Oral Daily  . pantoprazole  40 mg Oral Daily  . zolpidem  5 mg Oral QHS   Continuous  Infusions:  PRN Meds:.acetaminophen **OR** acetaminophen, albuterol, benzonatate, guaiFENesin-dextromethorphan, HYDROcodone-acetaminophen, ipratropium-albuterol, LORazepam, menthol-cetylpyridinium, morphine injection, ondansetron **OR** ondansetron (ZOFRAN) IV   Data Review:   Micro Results Recent Results (from the past 240 hour(s))  Blood culture (routine x 2)     Status: None (Preliminary result)   Collection Time: 02/05/16  2:53 PM  Result Value Ref Range Status   Specimen Description BLOOD RIGHT HAND  Final   Special Requests BOTTLES DRAWN AEROBIC AND ANAEROBIC  5CC  Final   Culture NO GROWTH < 24 HOURS  Final   Report Status PENDING  Incomplete  Blood culture (routine x 2)     Status: None (Preliminary result)   Collection Time: 02/05/16  2:53 PM  Result Value Ref Range Status   Specimen Description BLOOD RIGHT ASSIST CONTROL  Final   Special Requests BOTTLES DRAWN AEROBIC AND ANAEROBIC  5CC  Final   Culture NO GROWTH < 24 HOURS  Final   Report Status PENDING  Incomplete  MRSA PCR Screening     Status: None   Collection Time: 02/06/16  8:06 AM  Result Value Ref Range Status   MRSA by PCR NEGATIVE NEGATIVE Final    Comment:        The GeneXpert MRSA Assay (FDA approved for NASAL specimens only), is one component of a comprehensive MRSA colonization surveillance program. It is not intended to diagnose MRSA infection nor to guide or monitor treatment for MRSA infections.     Radiology Reports Dg Chest 2 View  02/05/2016  CLINICAL DATA:  50 year old female with 1 week history of productive cough EXAM: CHEST  2 VIEW COMPARISON:  Recent chest CT 01/31/2016; prior chest x-ray 12/05/2015 FINDINGS: Chain sutures noted in the left upper lobe consistent with left upper lobe wedge resection. Biapical pleural parenchymal scarring greater on the right than the left is similar compared to prior imaging. Stable hyperinflation with mild central airway thickening and interstitial  prominence. No new focal airspace consolidation,  pulmonary edema, pleural effusion or pneumothorax. Cardiac and mediastinal contours remain within normal limits. Atherosclerotic calcifications in the transverse aorta. No acute osseous abnormality. IMPRESSION: Stable chest x-ray without evidence of acute cardiopulmonary process. Electronically Signed   By: Malachy Moan M.D.   On: 02/05/2016 10:33   Ct Chest W Contrast  01/31/2016  CLINICAL DATA:  Bloody sputum and cough.  History of COPD. EXAM: CT CHEST WITH CONTRAST TECHNIQUE: Multidetector CT imaging of the chest was performed during intravenous contrast administration. CONTRAST:  75mL ISOVUE-300 IOPAMIDOL (ISOVUE-300) INJECTION 61% COMPARISON:  06/25/2011 FINDINGS: THORACIC INLET/BODY WALL: No acute abnormality. MEDIASTINUM: Normal heart size. No pericardial effusion. Atherosclerosis, including calcifications seen along the proximal LAD. No acute vascular abnormality. No adenopathy. LUNG WINDOWS: There is biapical pleural based reticulation without nodular focus that is attributed to scarring. Apical paraseptal emphysema. Postsurgical changes with calcification along the upper left major fissure. There is diffuse airway thickening with mucoid impaction or debris noted in segmental and subsegmental airways in the right lower lobe primarily. Subpleural opacity laterally in the left lower lobe has a scar-like appearance and is mildly contracted since 2012. There is no edema, consolidation, effusion, or pneumothorax. No cavity or bronchiectasis. UPPER ABDOMEN: No acute findings. OSSEOUS: No acute fracture.  No suspicious lytic or blastic lesions. IMPRESSION: 1. Airway thickening and scattered mucoid impaction. No focal finding to explain hemoptysis. 2. Biapical pleural based scarring.  Remote left upper lobe surgery. 3. Atherosclerosis, including the coronary arteries. Electronically Signed   By: Marnee Spring M.D.   On: 01/31/2016 13:43     CBC  Recent  Labs Lab 02/05/16 1010 02/06/16 0119  WBC 21.4* 13.6*  HGB 13.1 12.0  HCT 39.8 36.6  PLT 298 273  MCV 85.8 86.3  MCH 28.3 28.2  MCHC 33.0 32.7  RDW 17.9* 18.0*  LYMPHSABS 0.8*  --   MONOABS 0.7  --   EOSABS 0.1  --   BASOSABS 0.1  --     Chemistries   Recent Labs Lab 02/05/16 1010 02/06/16 0119  NA 142 139  K 4.1 3.9  CL 109 105  CO2 26 27  GLUCOSE 95 141*  BUN 16 20  CREATININE 0.66 0.67  CALCIUM 8.9 8.8*  AST  --  18  ALT  --  16  ALKPHOS  --  62  BILITOT  --  0.4   ------------------------------------------------------------------------------------------------------------------ estimated creatinine clearance is 60.8 mL/min (by C-G formula based on Cr of 0.67). ------------------------------------------------------------------------------------------------------------------ No results for input(s): HGBA1C in the last 72 hours. ------------------------------------------------------------------------------------------------------------------ No results for input(s): CHOL, HDL, LDLCALC, TRIG, CHOLHDL, LDLDIRECT in the last 72 hours. ------------------------------------------------------------------------------------------------------------------ No results for input(s): TSH, T4TOTAL, T3FREE, THYROIDAB in the last 72 hours.  Invalid input(s): FREET3 ------------------------------------------------------------------------------------------------------------------ No results for input(s): VITAMINB12, FOLATE, FERRITIN, TIBC, IRON, RETICCTPCT in the last 72 hours.  Coagulation profile No results for input(s): INR, PROTIME in the last 168 hours.  No results for input(s): DDIMER in the last 72 hours.  Cardiac Enzymes  Recent Labs Lab 02/05/16 1958 02/06/16 0119 02/06/16 0720  TROPONINI 0.04* <0.03 0.06*   ------------------------------------------------------------------------------------------------------------------ Invalid input(s): POCBNP    Assessment  & Plan   Caitlyn Evans is a 50 y.o. female with a known history of COPD, BOOP, coronary artery disease, anxiety, hyperlipidemia and multiple other medical problems is presenting to the ED with a chief complaint of worsening of cough and shortness of breath associated with subjective fevers.  #Acute COPD exacerbation with acute bronchitis and underlying BOOP Continue IV Solu-Medrol,  DUOneb nebulizer treatments IV levofloxacin, patient failed outpatient azithromycin  Pulmonology consult for abnormal CT BiPAP as needed   #Sepsis with fever, leukocytosis and tachycardia secondary to acute bronchitis Continue IV levofloxacin   #Elevated troponin probably from demand ischemia Will continue close monitoring of the patient on telemetry and cycle cardiac biomarkers.  #Essential hypertension currently not on a blood pressure medications Blood pressure normal  #Generalized anxiety providing Xanax as needed, patient very anxious and Ativan as needed  #Hyperlipidemia continue statin      Code Status Orders        Start     Ordered   02/05/16 1915  Full code   Continuous     02/05/16 1914    Code Status History    Date Active Date Inactive Code Status Order ID Comments User Context   12/05/2015 11:02 PM 12/09/2015  3:35 PM Full Code [id number]  Oralia Manisavid Willis, MD Inpatient    Advance Directive Documentation        Most Recent Value   Type of Advance Directive  Living will, Healthcare Power of Attorney   Pre-existing out of facility DNR order (yellow form or pink MOST form)     "MOST" Form in Place?             Consults  Pulmonary DVT Prophylaxis  Lovenox  Lab Results  Component Value Date   PLT 273 02/06/2016     Time Spent in minutes   35min Greater than 50% of time spent in care coordination and counseling patient regarding the condition and plan of care.   Auburn BilberryPATEL, Ilayda Toda M.D on 02/06/2016 at 1:36 PM  Between 7am to 6pm - Pager - [phone]  After 6pm go to  [email or web address] - password [email or web address] [facility]  [facility]   Office  [phone]

## 2016-02-06 NOTE — Progress Notes (Signed)
[facility].  Patient has been A&Ox4.  Pain in chest from coughing controlled with PO norco and IV morphine PRN.  No respiratory distress.  o2 sats stable on RA.  Daughter visiting at this time.  Patient will be moved to 1C room 107.

## 2016-02-07 ENCOUNTER — Telehealth: Payer: Self-pay | Admitting: *Deleted

## 2016-02-07 LAB — URINE DRUG SCREEN, QUALITATIVE (ARMC ONLY)
Amphetamines, Ur Screen: NOT DETECTED
BENZODIAZEPINE, UR SCRN: POSITIVE — AB
Barbiturates, Ur Screen: NOT DETECTED
Cannabinoid 50 Ng, Ur ~~LOC~~: POSITIVE — AB
Cocaine Metabolite,Ur ~~LOC~~: NOT DETECTED
MDMA (ECSTASY) UR SCREEN: NOT DETECTED
Methadone Scn, Ur: NOT DETECTED
Opiate, Ur Screen: POSITIVE — AB
PHENCYCLIDINE (PCP) UR S: NOT DETECTED
Tricyclic, Ur Screen: NOT DETECTED

## 2016-02-07 LAB — CBC WITH DIFFERENTIAL/PLATELET
BASOS ABS: 0 10*3/uL (ref 0–0.1)
Eosinophils Absolute: 0 10*3/uL (ref 0–0.7)
Eosinophils Relative: 0 %
HEMATOCRIT: 36.7 % (ref 35.0–47.0)
HEMOGLOBIN: 11.9 g/dL — AB (ref 12.0–16.0)
Lymphocytes Relative: 4 %
Lymphs Abs: 0.8 10*3/uL — ABNORMAL LOW (ref 1.0–3.6)
MCH: 28.2 pg (ref 26.0–34.0)
MCHC: 32.6 g/dL (ref 32.0–36.0)
MCV: 86.5 fL (ref 80.0–100.0)
Monocytes Absolute: 1.1 10*3/uL — ABNORMAL HIGH (ref 0.2–0.9)
NEUTROS ABS: 18.3 10*3/uL — AB (ref 1.4–6.5)
Platelets: 275 10*3/uL (ref 150–440)
RBC: 4.24 MIL/uL (ref 3.80–5.20)
RDW: 18.5 % — ABNORMAL HIGH (ref 11.5–14.5)
WBC: 20.2 10*3/uL — AB (ref 3.6–11.0)

## 2016-02-07 MED ORDER — ENSURE ENLIVE PO LIQD
237.0000 mL | Freq: Two times a day (BID) | ORAL | Status: DC
  Administered 2016-02-07 – 2016-02-08 (×3): 237 mL via ORAL

## 2016-02-07 MED ORDER — HYDROMORPHONE HCL 1 MG/ML IJ SOLN
1.0000 mg | INTRAMUSCULAR | Status: DC | PRN
  Administered 2016-02-07 – 2016-02-08 (×4): 1 mg via INTRAVENOUS
  Filled 2016-02-07: qty 2
  Filled 2016-02-07 (×3): qty 1

## 2016-02-07 MED ORDER — LORAZEPAM 2 MG/ML IJ SOLN
1.0000 mg | Freq: Once | INTRAMUSCULAR | Status: AC
  Administered 2016-02-07: 18:00:00 1 mg via INTRAVENOUS
  Filled 2016-02-07: qty 1

## 2016-02-07 NOTE — Progress Notes (Signed)
Per police the white and green substances were salt and pepper pulled from tray under bedside table.

## 2016-02-07 NOTE — Progress Notes (Signed)
Notified Dr Nemiah CommanderKalisetti that security was notified about white powdery substance and green grass like subtance that was seen in pt's room by [employer] personnel. Awaiting the police. Pt does not know that we called security. Pt c/o sob and requests BiPAP and c/o mid chest pain. O2 sats 99% on RA. Dilaudid given. BiPAP applied by respiratory therapist. Pt is very anxious, ativan is not due. MD to order 1xmg IV ativan and urine drug screen.

## 2016-02-07 NOTE — Progress Notes (Signed)
Initial Nutrition Assessment  DOCUMENTATION CODES:   Not applicable  INTERVENTION:   Recommend liberalizing diet order to regular to optimize nutritional intake Will recommend Ensure Enlive po BID, each supplement provides 350 kcal and 20 grams of protein   NUTRITION DIAGNOSIS:   Inadequate oral intake related to poor appetite as evidenced by per patient/family report.  GOAL:   Patient will meet greater than or equal to 90% of their needs  MONITOR:   PO intake, Supplement acceptance, Skin, Weight trends, Labs, I & O's  REASON FOR ASSESSMENT:   Malnutrition Screening Tool    ASSESSMENT:   Pt admitted with COPD exacerbation.  Past Medical History  Diagnosis Date  . Migraines   . Hypercholesterolemia   . Seasonal allergies   . Emphysema lung (HCC) 05/30/2015  . Dysphagia, pharyngoesophageal phase 05/30/2015  . Lung mass   . Shortness of breath dyspnea     1 flight-stairs  . Anxiety     panic attacks  . Motion sickness     all moving vehicles  . Myocardial infarction (HCC) 2012  . BOOP (bronchiolitis obliterans with organizing pneumonia) (HCC)      Diet Order:  Diet regular Room service appropriate?: Yes; Fluid consistency:: Thin   Pt reports not eating much this morning as she was unable to get a breakfast meat. Per documentation pt has been eating 64% of meals since admission.  Pt reports her appetite fluctuates secondary to steroid dosages outpatient. When asked if pt usually eats 3 meals per day, pt reports only eating only large dinner meal, never breakfast and eats 'junk food' per her daughter such as chips, snacks, throughout the day. Pt reports drinking Boost at home when she is able to afford.   Pt reports having dysphagia and esophageal tightening about one year ago, after which she had a dilation per her description and has not been having issues since.   Medications: Colace, MVI, Protonix, Solumedrol Labs: reviewed   Gastrointestinal  Profile: Last BM:  02/05/2016   Nutrition-Focused Physical Exam Findings: Nutrition-Focused physical exam completed. Findings are WDL for fat depletion, muscle depletion, and edema.    Weight Change: Pt reports weight of 180lbs 5 years ago and that slowly it has come down in the past 5 years. Pt reports more recently her weight has fluctuated secondary to medications outpatient. Pt reports weight of 114lbs now up from 110lbs.    Skin:  Reviewed, no issues  Height:   Ht Readings from Last 1 Encounters:  02/06/16 4\' 11"  (1.499 m)    Weight:   Wt Readings from Last 1 Encounters:  02/06/16 114 lb (51.71 kg)    BMI:  Body mass index is 23.01 kg/(m^2).  Estimated Nutritional Needs:   Kcal:  1360-1615kcals  Protein:  57-68g protein  Fluid:  >1.5L fluid  EDUCATION NEEDS:   No education needs identified at this time  Leda QuailAllyson Lavender Stanke, RD, LDN Pager [phone] Weekend/On-Call Pager [phone]

## 2016-02-07 NOTE — Progress Notes (Signed)
[facility] - [place] at [facility]                                                                                                                                                                                            Patient Demographics   Caitlyn Evans, is a 50 y.o. female, DOB - 21-Feb-1966, RUE:[id number]  Admit date - 02/05/2016   Admitting Physician Ramonita Lab, MD  Outpatient Primary MD for the patient is MASOUD,JAVED, MD   LOS - 2  Subjective: patient c/o Persistent shortness of breath and pain in the chest and back.     Review of Systems:   CONSTITUTIONAL: No documented fever. No fatigue, weakness. No weight gain, no weight loss.  EYES: No blurry or double vision.  ENT: No tinnitus. No postnasal drip. No redness of the oropharynx.  RESPIRATORY: Positive cough, no wheeze, no hemoptysis. Positive dyspnea.  CARDIOVASCULAR: No chest pain. No orthopnea. No palpitations. No syncope.  GASTROINTESTINAL: No nausea, no vomiting or diarrhea. No abdominal pain. No melena or hematochezia.  GENITOURINARY: No dysuria or hematuria.  ENDOCRINE: No polyuria or nocturia. No heat or cold intolerance.  HEMATOLOGY: No anemia. No bruising. No bleeding.  INTEGUMENTARY: No rashes. No lesions.  MUSCULOSKELETAL: No arthritis. No swelling. No gout. + back pain NEUROLOGIC: No numbness, tingling, or ataxia. No seizure-type activity.  PSYCHIATRIC: No anxiety. No insomnia. No ADD.    Vitals:   Filed Vitals:   02/07/16 0221 02/07/16 0625 02/07/16 0806 02/07/16 1328  BP:  144/73  136/81  Pulse:  74  88  Temp:  97.7 F (36.5 C)    TempSrc:  Oral    Resp:  20  20  Height:      Weight:      SpO2: 95% 98% 97% 98%    Wt Readings from Last 3 Encounters:  02/06/16 51.71 kg (114 lb)  12/05/15 52.164 kg (115 lb)  07/14/15 47.628 kg (105 lb)     Intake/Output Summary (Last 24 hours) at 02/07/16 1340 Last data filed at 02/07/16 1340  Gross per 24 hour  Intake   1200  ml  Output   1025 ml  Net    175 ml    Physical Exam:   GENERAL: Anxious appearing  HEAD, EYES, EARS, NOSE AND THROAT: Atraumatic, normocephalic. Extraocular muscles are intact. Pupils equal and reactive to light. Sclerae anicteric. No conjunctival injection. No oro-pharyngeal erythema.  NECK: Supple. There is no jugular venous distention. No bruits, no lymphadenopathy, no thyromegaly.  HEART: Regular rate and rhythm,. No murmurs, no rubs, no clicks.  LUNGS: Decreased breath sounds bilaterally without any  rales rhonchi wheezing  ABDOMEN: Soft, flat, nontender, nondistended. Has good bowel sounds. No hepatosplenomegaly appreciated.  EXTREMITIES: No evidence of any cyanosis, clubbing, or peripheral edema.  +2 pedal and radial pulses bilaterally.  NEUROLOGIC: The patient is alert, awake, and oriented x3 with no focal motor or sensory deficits appreciated bilaterally.  SKIN: Moist and warm with no rashes appreciated.  Psych: Not anxious, depressed LN: No inguinal LN enlargement    Antibiotics   Anti-infectives    Start     Dose/Rate Route Frequency Ordered Stop   02/06/16 1400  levofloxacin (LEVAQUIN) IVPB 500 mg     500 mg 100 mL/hr over 60 Minutes Intravenous Every 24 hours 02/05/16 1927 02/12/16 2359   02/05/16 1430  levofloxacin (LEVAQUIN) IVPB 750 mg     750 mg 100 mL/hr over 90 Minutes Intravenous  Once 02/05/16 1416 02/05/16 1627      Medications   Scheduled Meds: . ALPRAZolam  0.25 mg Oral BID  . aspirin EC  325 mg Oral Daily  . docusate sodium  100 mg Oral BID  . enoxaparin (LOVENOX) injection  40 mg Subcutaneous Q24H  . feeding supplement (ENSURE ENLIVE)  237 mL Oral BID BM  . ipratropium-albuterol  3 mL Nebulization Q6H  . levofloxacin (LEVAQUIN) IV  500 mg Intravenous Q24H  . methylPREDNISolone (SOLU-MEDROL) injection  60 mg Intravenous Q6H  . multivitamin with minerals  1 tablet Oral Daily  . pantoprazole  40 mg Oral Daily  . zolpidem  5 mg Oral QHS    Continuous Infusions:  PRN Meds:.acetaminophen **OR** acetaminophen, albuterol, benzonatate, guaiFENesin-dextromethorphan, HYDROcodone-acetaminophen, HYDROmorphone (DILAUDID) injection, ipratropium-albuterol, LORazepam, menthol-cetylpyridinium, ondansetron **OR** ondansetron (ZOFRAN) IV   Data Review:   Micro Results Recent Results (from the past 240 hour(s))  Blood culture (routine x 2)     Status: None (Preliminary result)   Collection Time: 02/05/16  2:53 PM  Result Value Ref Range Status   Specimen Description BLOOD RIGHT HAND  Final   Special Requests BOTTLES DRAWN AEROBIC AND ANAEROBIC  5CC  Final   Culture NO GROWTH 2 DAYS  Final   Report Status PENDING  Incomplete  Blood culture (routine x 2)     Status: None (Preliminary result)   Collection Time: 02/05/16  2:53 PM  Result Value Ref Range Status   Specimen Description BLOOD RIGHT ASSIST CONTROL  Final   Special Requests BOTTLES DRAWN AEROBIC AND ANAEROBIC  5CC  Final   Culture NO GROWTH 2 DAYS  Final   Report Status PENDING  Incomplete  MRSA PCR Screening     Status: None   Collection Time: 02/06/16  8:06 AM  Result Value Ref Range Status   MRSA by PCR NEGATIVE NEGATIVE Final    Comment:        The GeneXpert MRSA Assay (FDA approved for NASAL specimens only), is one component of a comprehensive MRSA colonization surveillance program. It is not intended to diagnose MRSA infection nor to guide or monitor treatment for MRSA infections.     Radiology Reports Dg Chest 2 View  02/05/2016  CLINICAL DATA:  50 year old female with 1 week history of productive cough EXAM: CHEST  2 VIEW COMPARISON:  Recent chest CT 01/31/2016; prior chest x-ray 12/05/2015 FINDINGS: Chain sutures noted in the left upper lobe consistent with left upper lobe wedge resection. Biapical pleural parenchymal scarring greater on the right than the left is similar compared to prior imaging. Stable hyperinflation with mild central airway thickening  and interstitial prominence. No new focal  airspace consolidation, pulmonary edema, pleural effusion or pneumothorax. Cardiac and mediastinal contours remain within normal limits. Atherosclerotic calcifications in the transverse aorta. No acute osseous abnormality. IMPRESSION: Stable chest x-ray without evidence of acute cardiopulmonary process. Electronically Signed   By: Malachy Moan M.D.   On: 02/05/2016 10:33   Ct Chest W Contrast  01/31/2016  CLINICAL DATA:  Bloody sputum and cough.  History of COPD. EXAM: CT CHEST WITH CONTRAST TECHNIQUE: Multidetector CT imaging of the chest was performed during intravenous contrast administration. CONTRAST:  75mL ISOVUE-300 IOPAMIDOL (ISOVUE-300) INJECTION 61% COMPARISON:  06/25/2011 FINDINGS: THORACIC INLET/BODY WALL: No acute abnormality. MEDIASTINUM: Normal heart size. No pericardial effusion. Atherosclerosis, including calcifications seen along the proximal LAD. No acute vascular abnormality. No adenopathy. LUNG WINDOWS: There is biapical pleural based reticulation without nodular focus that is attributed to scarring. Apical paraseptal emphysema. Postsurgical changes with calcification along the upper left major fissure. There is diffuse airway thickening with mucoid impaction or debris noted in segmental and subsegmental airways in the right lower lobe primarily. Subpleural opacity laterally in the left lower lobe has a scar-like appearance and is mildly contracted since 2012. There is no edema, consolidation, effusion, or pneumothorax. No cavity or bronchiectasis. UPPER ABDOMEN: No acute findings. OSSEOUS: No acute fracture.  No suspicious lytic or blastic lesions. IMPRESSION: 1. Airway thickening and scattered mucoid impaction. No focal finding to explain hemoptysis. 2. Biapical pleural based scarring.  Remote left upper lobe surgery. 3. Atherosclerosis, including the coronary arteries. Electronically Signed   By: Marnee Spring M.D.   On: 01/31/2016 13:43      CBC  Recent Labs Lab 02/05/16 1010 02/06/16 0119 02/07/16 0519  WBC 21.4* 13.6* 20.2*  HGB 13.1 12.0 11.9*  HCT 39.8 36.6 36.7  PLT 298 273 275  MCV 85.8 86.3 86.5  MCH 28.3 28.2 28.2  MCHC 33.0 32.7 32.6  RDW 17.9* 18.0* 18.5*  LYMPHSABS 0.8*  --  0.8*  MONOABS 0.7  --  1.1*  EOSABS 0.1  --  0.0  BASOSABS 0.1  --  0.0    Chemistries   Recent Labs Lab 02/05/16 1010 02/06/16 0119  NA 142 139  K 4.1 3.9  CL 109 105  CO2 26 27  GLUCOSE 95 141*  BUN 16 20  CREATININE 0.66 0.67  CALCIUM 8.9 8.8*  AST  --  18  ALT  --  16  ALKPHOS  --  62  BILITOT  --  0.4   ------------------------------------------------------------------------------------------------------------------ estimated creatinine clearance is 56.1 mL/min (by C-G formula based on Cr of 0.67). ------------------------------------------------------------------------------------------------------------------ No results for input(s): HGBA1C in the last 72 hours. ------------------------------------------------------------------------------------------------------------------ No results for input(s): CHOL, HDL, LDLCALC, TRIG, CHOLHDL, LDLDIRECT in the last 72 hours. ------------------------------------------------------------------------------------------------------------------ No results for input(s): TSH, T4TOTAL, T3FREE, THYROIDAB in the last 72 hours.  Invalid input(s): FREET3 ------------------------------------------------------------------------------------------------------------------ No results for input(s): VITAMINB12, FOLATE, FERRITIN, TIBC, IRON, RETICCTPCT in the last 72 hours.  Coagulation profile No results for input(s): INR, PROTIME in the last 168 hours.  No results for input(s): DDIMER in the last 72 hours.  Cardiac Enzymes  Recent Labs Lab 02/05/16 1958 02/06/16 0119 02/06/16 0720  TROPONINI 0.04* <0.03 0.06*    ------------------------------------------------------------------------------------------------------------------ Invalid input(s): POCBNP    Assessment & Plan   Jaqulyn Morine is a 50 y.o. female with a known history of COPD, BOOP, coronary artery disease, anxiety, hyperlipidemia and multiple other medical problems is presenting to the ED with a chief complaint of worsening of cough and shortness of breath associated  with subjective fevers.  #Acute COPD exacerbation with acute bronchitis and underlying BOOP Continues to be symptomatic Continue IV  Solu-Medrol, DUOneb nebulizer treatments 1 more day IV level Floxin then oral Seen by pulmonary and appreciate input BiPAP as needed   #Sepsis with fever, leukocytosis and tachycardia secondary to acute bronchitis Continue IV levofloxacin Blood cultures negative   #Elevated troponin probably from demand ischemia Will continue close monitoring of the patient on telemetry and cycle cardiac biomarkers.  #Essential hypertension currently not on a blood pressure medications Blood pressure normal  #Generalized anxiety continue Ativan and Xanax   #Hyperlipidemia continue statin      Code Status Orders        Start     Ordered   02/05/16 1915  Full code   Continuous     02/05/16 1914    Code Status History    Date Active Date Inactive Code Status Order ID Comments User Context   12/05/2015 11:02 PM 12/09/2015  3:35 PM Full Code [id number]  Oralia Manisavid Willis, MD Inpatient    Advance Directive Documentation        Most Recent Value   Type of Advance Directive  Living will, Healthcare Power of Attorney   Pre-existing out of facility DNR order (yellow form or pink MOST form)     "MOST" Form in Place?             Consults  Pulmonary DVT Prophylaxis  Lovenox  Lab Results  Component Value Date   PLT 275 02/07/2016     Time Spent in minutes   32min Greater than 50% of time spent in care coordination and counseling patient  regarding the condition and plan of care.   Auburn BilberryPATEL, Ariea Rochin M.D on 02/07/2016 at 1:40 PM  Between 7am to 6pm - Pager - [phone]  After 6pm go to [email or web address] - password [email or web address] [facility]  [facility]   Office  [phone]

## 2016-02-07 NOTE — Telephone Encounter (Signed)
Spoke with Champ MungoSharika on the unit and gave hosp f/u appt for 03/04/16 @ 10am. Nothing further needed.

## 2016-02-07 NOTE — Progress Notes (Signed)
[facility] RN to place patient on Bipap per patient's request.  O2 sat 99% on room air, patient appears anxious, slurring her words.  Bipap placed on patient without complication, will continue to monitor.

## 2016-02-07 NOTE — Care Management Important Message (Signed)
Important Message  Patient Details  Name: Caitlyn Evans MRN: [id number] Date of Birth: [birth year]   Medicare Important Message Given:  Yes    Olegario MessierKathy A Dallas Scorsone 02/07/2016, 11:15 AM

## 2016-02-07 NOTE — Clinical Documentation Improvement (Signed)
Internal Medicine  Can the diagnosis of "difficulty breathing" be further specified?   Document Acuity - Acute, Chronic, Acute on Chronic  Document Inclusion Of - Hypoxia, Hypercapnia, Combination of Both  Document Tobacco - Use, Abuse, History Of  Other  Clinically Undetermined  Document any associated diagnoses/conditions. Please update your documentation within the medical record to reflect your response to this query. Thank you.  Supporting Information:(As per notes)  "LUNGS: Diffuse coarse bronchial breath sounds bilaterally, diffuse wheezing, with rales,rhonchi or crepitation. some use of accessory muscles of respiration" "past 2 months she has been feeling increased shortness of breath, increased cough and increased weakness" "Patient had CAT scan of the chest performed 4 days ago which has revealed mucous plugging #Acute COPD exacerbation with acute bronchitis and underlying BOOP We'll give IV Solu-Medrol, DUOneb nebulizer treatments IV levofloxacin, patient failed outpatient azithromycin 2 Pulmonology consult, BiPAP as needed"  Pt on BIPAP Labs: Arterial Blood Gas Component     Latest Ref Rng 02/05/2016  FIO2      35.00  Delivery systems      BILEVEL POSITIVE AIRWAY PRESSURE  LHR      10  Inspiratory PAP      16  Expiratory PAP      6  pH, Arterial     7.350 - 7.450 7.61 (HH)  pCO2 arterial     32.0 - 48.0 mmHg 25 (L)  pO2, Arterial     83.0 - 108.0 mmHg 206 (H)  Bicarbonate     21.0 - 28.0 mEq/L 25.1  Acid-Base Excess     0.0 - 3.0 mmol/L 4.7 (H)   Please exercise your independent, professional judgment when responding. A specific answer is not anticipated or expected.  Thank You, Nevin BloodgoodJoan B Wynston Romey, RN, BSN, CCDS,Clinical Documentation Specialist:  [phone]  [phone]=Cell [place]- Health Information Management

## 2016-02-07 NOTE — Progress Notes (Signed)
Pt was witnessed by dietary services with bags of a white powdery substance along with bags of a green grass like substance on her bedside table. Charge nurse entered the room after she was notified, but did not see the substances. Security was notified, they have notified the police department, awaiting their arrival for further instructions. MD is aware.

## 2016-02-07 NOTE — Plan of Care (Signed)
Problem: Discharge Progression Outcomes Goal: O2 sats > or equal 90% or at baseline Outcome: Progressing Pt on RA with O2 sats in the high 90's. Dyspnea on exertrion and at rest with coughing spells.  Goal: Independent ADLs or Home Health Care Outcome: Progressing Pt ambulates in the hall independently several times during the shift. Goal: Pain controlled with appropriate interventions Outcome: Progressing Pt c/o chronic headache and back pain. Morphine and norco given with minimal relief. Dilaudid ordered and given with improvement. Ativen givenx2 for anxiety with good effect.

## 2016-02-07 NOTE — Telephone Encounter (Signed)
-----   Message from Shane CrutchPradeep Ramachandran, MD sent at 02/06/2016  4:46 PM EDT ----- pls arrange post-discharge follow up with me in 2-4 weeks. Pt was called after last admission, but did not follow up.

## 2016-02-08 MED ORDER — FLUTICASONE-SALMETEROL 250-50 MCG/DOSE IN AEPB: 1.0000 | INHALATION_SPRAY | Freq: Two times a day (BID) | RESPIRATORY_TRACT | Status: DC

## 2016-02-08 MED ORDER — GUAIFENESIN-DM 100-10 MG/5ML PO SYRP: 5.0000 mL | ORAL_SOLUTION | ORAL | Status: DC | PRN

## 2016-02-08 MED ORDER — LORAZEPAM 1 MG PO TABS: 1.0000 mg | ORAL_TABLET | Freq: Three times a day (TID) | ORAL | Status: DC

## 2016-02-08 MED ORDER — TIOTROPIUM BROMIDE MONOHYDRATE 18 MCG IN CAPS: 18.0000 ug | ORAL_CAPSULE | Freq: Every day | RESPIRATORY_TRACT | Status: DC

## 2016-02-08 MED ORDER — PREDNISONE 10 MG (21) PO TBPK: 10.0000 mg | ORAL_TABLET | Freq: Every day | ORAL | Status: DC

## 2016-02-08 MED ORDER — LEVOFLOXACIN 500 MG PO TABS: 500.0000 mg | ORAL_TABLET | Freq: Every day | ORAL | Status: AC

## 2016-02-08 NOTE — Discharge Instructions (Signed)

## 2016-02-08 NOTE — Progress Notes (Addendum)
Alert. No resp distress. Pt for discharge home.  Instructions discussed with pt. presc given and discussed. Home meds discussed.  Diet / activity and f/u discussed. Verbalized understanding of all. Home at this time  With w/c w/o c/o. Sl d/cd.

## 2016-02-08 NOTE — Discharge Summary (Signed)
Caitlyn Evans, 50 y.o., DOB 1966-07-27, MRN [id number]. Admission date: 02/05/2016 Discharge Date 02/08/2016 Primary MD Corky Downs, MD Admitting Physician Ramonita Lab, MD  Admission Diagnosis  COPD exacerbation (HCC) [J44.1] Cough productive of purulent sputum [R05]  Discharge Diagnosis   Active Problems:   Acute on chronic COPD with acute exacerbation (HCC)   Acute bronchitis   Substance abuse  Chronic pain syndrome Elevated troponin due to demand ischemia Essential hypertension Generalized anxiety disorder Hyperlipidemia      Hospital Course Caitlyn Evans is a 50 y.o. female with a known history of COPD, BOOP, coronary artery disease, anxiety, hyperlipidemia and multiple other medical problems is presenting to the ED with a chief complaint of worsening of cough and shortness of breath associated with subjective fevers. Patient initially required BiPAP. She was treated with nebulizers steroids and antibiotics. She was also seen by pulmonary today strongly recommended she follow-up in their office. Patient during the hospitalization was also noticed to have cocaine and marijuana on her table as well. Security was called. Her breathing is improved.           Consults  pulmonary/intensive care  Significant Tests:  See full reports for all details    Dg Chest 2 View  02/05/2016  CLINICAL DATA:  50 year old female with 1 week history of productive cough EXAM: CHEST  2 VIEW COMPARISON:  Recent chest CT 01/31/2016; prior chest x-ray 12/05/2015 FINDINGS: Chain sutures noted in the left upper lobe consistent with left upper lobe wedge resection. Biapical pleural parenchymal scarring greater on the right than the left is similar compared to prior imaging. Stable hyperinflation with mild central airway thickening and interstitial prominence. No new focal airspace consolidation, pulmonary edema, pleural effusion or pneumothorax. Cardiac and mediastinal contours remain within normal  limits. Atherosclerotic calcifications in the transverse aorta. No acute osseous abnormality. IMPRESSION: Stable chest x-ray without evidence of acute cardiopulmonary process. Electronically Signed   By: Malachy Moan M.D.   On: 02/05/2016 10:33   Ct Chest W Contrast  01/31/2016  CLINICAL DATA:  Bloody sputum and cough.  History of COPD. EXAM: CT CHEST WITH CONTRAST TECHNIQUE: Multidetector CT imaging of the chest was performed during intravenous contrast administration. CONTRAST:  75mL ISOVUE-300 IOPAMIDOL (ISOVUE-300) INJECTION 61% COMPARISON:  06/25/2011 FINDINGS: THORACIC INLET/BODY WALL: No acute abnormality. MEDIASTINUM: Normal heart size. No pericardial effusion. Atherosclerosis, including calcifications seen along the proximal LAD. No acute vascular abnormality. No adenopathy. LUNG WINDOWS: There is biapical pleural based reticulation without nodular focus that is attributed to scarring. Apical paraseptal emphysema. Postsurgical changes with calcification along the upper left major fissure. There is diffuse airway thickening with mucoid impaction or debris noted in segmental and subsegmental airways in the right lower lobe primarily. Subpleural opacity laterally in the left lower lobe has a scar-like appearance and is mildly contracted since 2012. There is no edema, consolidation, effusion, or pneumothorax. No cavity or bronchiectasis. UPPER ABDOMEN: No acute findings. OSSEOUS: No acute fracture.  No suspicious lytic or blastic lesions. IMPRESSION: 1. Airway thickening and scattered mucoid impaction. No focal finding to explain hemoptysis. 2. Biapical pleural based scarring.  Remote left upper lobe surgery. 3. Atherosclerosis, including the coronary arteries. Electronically Signed   By: Marnee Spring M.D.   On: 01/31/2016 13:43       Today   Subjective:   Caitlyn Evans  feeling better shortness of breath improved  Objective:   Blood pressure 120/72, pulse 79, temperature 98 F (36.7  C), temperature source Oral, resp. rate 20,  height 4\' 11"  (1.499 m), weight 51.71 kg (114 lb), SpO2 97 %.  .  Intake/Output Summary (Last 24 hours) at 02/08/16 1443 Last data filed at 02/08/16 0900  Gross per 24 hour  Intake    800 ml  Output   1200 ml  Net   -400 ml    Exam VITAL SIGNS: Blood pressure 120/72, pulse 79, temperature 98 F (36.7 C), temperature source Oral, resp. rate 20, height 4\' 11"  (1.499 m), weight 51.71 kg (114 lb), SpO2 97 %.  GENERAL:  50 y.o.-year-old patient lying in the bed with no acute distress.  EYES: Pupils equal, round, reactive to light and accommodation. No scleral icterus. Extraocular muscles intact.  HEENT: Head atraumatic, normocephalic. Oropharynx and nasopharynx clear.  NECK:  Supple, no jugular venous distention. No thyroid enlargement, no tenderness.  LUNGS: Normal breath sounds bilaterally, no wheezing, rales,rhonchi or crepitation. No use of accessory muscles of respiration.  CARDIOVASCULAR: S1, S2 normal. No murmurs, rubs, or gallops.  ABDOMEN: Soft, nontender, nondistended. Bowel sounds present. No organomegaly or mass.  EXTREMITIES: No pedal edema, cyanosis, or clubbing.  NEUROLOGIC: Cranial nerves II through XII are intact. Muscle strength 5/5 in all extremities. Sensation intact. Gait not checked.  PSYCHIATRIC: The patient is alert and oriented x 3.  SKIN: No obvious rash, lesion, or ulcer.   Data Review     CBC w Diff: Lab Results  Component Value Date   WBC 20.2* 02/07/2016   WBC 10.6 06/22/2013   HGB 11.9* 02/07/2016   HGB 14.2 06/22/2013   HCT 36.7 02/07/2016   HCT 42.7 06/22/2013   PLT 275 02/07/2016   PLT 291 06/22/2013   LYMPHOPCT 4% 02/07/2016   LYMPHOPCT 6.8 07/23/2012   MONOPCT 6% 02/07/2016   MONOPCT 5.7 07/23/2012   MONOPCT 1 10/05/2011   EOSPCT 0% 02/07/2016   EOSPCT 0.0 07/23/2012   BASOPCT 0% 02/07/2016   BASOPCT 0.1 07/23/2012   CMP: Lab Results  Component Value Date   NA 139 02/06/2016   NA 140  06/22/2013   K 3.9 02/06/2016   K 3.5 06/22/2013   CL 105 02/06/2016   CL 110* 06/22/2013   CO2 27 02/06/2016   CO2 24 06/22/2013   BUN 20 02/06/2016   BUN 16 06/22/2013   CREATININE 0.67 02/06/2016   CREATININE 0.85 06/22/2013   PROT 6.5 02/06/2016   PROT 7.5 06/22/2013   ALBUMIN 3.5 02/06/2016   ALBUMIN 4.1 06/22/2013   BILITOT 0.4 02/06/2016   BILITOT 0.2 06/22/2013   ALKPHOS 62 02/06/2016   ALKPHOS 107 06/22/2013   AST 18 02/06/2016   AST 33 06/22/2013   ALT 16 02/06/2016   ALT 54 06/22/2013  .  Micro Results Recent Results (from the past 240 hour(s))  Blood culture (routine x 2)     Status: None (Preliminary result)   Collection Time: 02/05/16  2:53 PM  Result Value Ref Range Status   Specimen Description BLOOD RIGHT HAND  Final   Special Requests BOTTLES DRAWN AEROBIC AND ANAEROBIC  5CC  Final   Culture NO GROWTH 3 DAYS  Final   Report Status PENDING  Incomplete  Blood culture (routine x 2)     Status: None (Preliminary result)   Collection Time: 02/05/16  2:53 PM  Result Value Ref Range Status   Specimen Description BLOOD RIGHT ASSIST CONTROL  Final   Special Requests BOTTLES DRAWN AEROBIC AND ANAEROBIC  5CC  Final   Culture NO GROWTH 3 DAYS  Final   Report  Status PENDING  Incomplete  MRSA PCR Screening     Status: None   Collection Time: 02/06/16  8:06 AM  Result Value Ref Range Status   MRSA by PCR NEGATIVE NEGATIVE Final    Comment:        The GeneXpert MRSA Assay (FDA approved for NASAL specimens only), is one component of a comprehensive MRSA colonization surveillance program. It is not intended to diagnose MRSA infection nor to guide or monitor treatment for MRSA infections.         Code Status Orders        Start     Ordered   02/05/16 1915  Full code   Continuous     02/05/16 1914    Code Status History    Date Active Date Inactive Code Status Order ID Comments User Context   12/05/2015 11:02 PM 12/09/2015  3:35 PM Full Code  [id number]  Oralia Manisavid Willis, MD Inpatient    Advance Directive Documentation        Most Recent Value   Type of Advance Directive  Living will, Healthcare Power of Attorney   Pre-existing out of facility DNR order (yellow form or pink MOST form)     "MOST" Form in Place?            Follow-up Information    Follow up with Shane CrutchPradeep Ramachandran, MD. Go on 03/04/2016.   Specialty:  Pulmonary Disease   Why:  @ 10:00 A.M.   Contact information:   [address] [place] [address] [phone]       Follow up with Corky DownsMASOUD,JAVED, MD On 02/15/2016.   Specialty:  Internal Medicine   Why:  at 10:15   Contact information:   [address] [place] [address] [phone]       Discharge Medications     Medication List    STOP taking these medications        ALPRAZolam 0.25 MG tablet  Commonly known as:  XANAX     oseltamivir 75 MG capsule  Commonly known as:  TAMIFLU      TAKE these medications        ALKA-SELTZER ANTI-GAS PO  Take 2 each by mouth as needed (for indigestion/heartburn).     benzonatate 200 MG capsule  Commonly known as:  TESSALON  Take 1 capsule (200 mg total) by mouth 3 (three) times daily as needed for cough.     Fluticasone-Salmeterol 250-50 MCG/DOSE Aepb  Commonly known as:  ADVAIR DISKUS  Inhale 1 puff into the lungs 2 (two) times daily.     guaiFENesin-dextromethorphan 100-10 MG/5ML syrup  Commonly known as:  ROBITUSSIN DM  Take 5 mLs by mouth every 4 (four) hours as needed for cough.     HYDROcodone-acetaminophen 10-325 MG tablet  Commonly known as:  NORCO  Take 0.5 tablets by mouth 2 (two) times daily as needed for moderate pain.     ipratropium-albuterol 0.5-2.5 (3) MG/3ML Soln  Commonly known as:  DUONEB  Inhale 3 mLs into the lungs every 4 (four) hours as needed (for wheezing/shortness of breath).     levofloxacin 500 MG tablet  Commonly known as:  LEVAQUIN  Take 1 tablet (500 mg total) by mouth daily.     LORazepam  1 MG tablet  Commonly known as:  ATIVAN  Take 1 tablet (1 mg total) by mouth every 8 (eight) hours.     MULTIVITAMIN GUMMIES ADULT Chew  Chew 1 each by mouth daily.  pantoprazole 20 MG tablet  Commonly known as:  PROTONIX  Take 20 mg by mouth daily.     predniSONE 10 MG (21) Tbpk tablet  Commonly known as:  STERAPRED UNI-PAK 21 TAB  Take 1 tablet (10 mg total) by mouth daily. Start at 60mg  taper by 10mg  until complete     tiotropium 18 MCG inhalation capsule  Commonly known as:  SPIRIVA HANDIHALER  Place 1 capsule (18 mcg total) into inhaler and inhale daily.     zolpidem 10 MG tablet  Commonly known as:  AMBIEN  Take 10 mg by mouth at bedtime.           Total Time in preparing paper work, data evaluation and todays exam - 35 minutes  Auburn Bilberry M.D on 02/08/2016 at 2:43 PM  [facility]   Office  [phone]

## 2016-02-10 LAB — CULTURE, BLOOD (ROUTINE X 2)
CULTURE: NO GROWTH
Culture: NO GROWTH

## 2016-02-15 DIAGNOSIS — J439 Emphysema, unspecified: Secondary | ICD-10-CM | POA: Diagnosis not present

## 2016-02-15 DIAGNOSIS — I119 Hypertensive heart disease without heart failure: Secondary | ICD-10-CM | POA: Diagnosis not present

## 2016-02-15 DIAGNOSIS — I429 Cardiomyopathy, unspecified: Secondary | ICD-10-CM | POA: Diagnosis not present

## 2016-03-04 ENCOUNTER — Encounter: Payer: Medicare Other | Admitting: Internal Medicine

## 2016-03-04 ENCOUNTER — Encounter: Payer: Self-pay | Admitting: *Deleted

## 2016-03-04 NOTE — Progress Notes (Signed)
[facility]     Assessment and Plan:  COPD/emphysema.  Nicotine abuse.  Insomnia.  Anxiety.  Date: 03/04/2016  MRN# [id number] Caitlyn Evans 1966-06-17   Caitlyn Evans is a 50 y.o. old female seen in follow up for chief complaint of  No chief complaint on file.    HPI:   Patient's a 49 year old female, recently seen in the hospital due to COPD exacerbation. She also has a history of BOOP and tobacco abuse.   Medication:   Outpatient Encounter Prescriptions as of 03/04/2016  Medication Sig  . benzonatate (TESSALON) 200 MG capsule Take 1 capsule (200 mg total) by mouth 3 (three) times daily as needed for cough. (Patient not taking: Reported on 02/05/2016)  . Fluticasone-Salmeterol (ADVAIR DISKUS) 250-50 MCG/DOSE AEPB Inhale 1 puff into the lungs 2 (two) times daily.  Marland Kitchen guaiFENesin-dextromethorphan (ROBITUSSIN DM) 100-10 MG/5ML syrup Take 5 mLs by mouth every 4 (four) hours as needed for cough.  Marland Kitchen HYDROcodone-acetaminophen (NORCO) 10-325 MG tablet Take 0.5 tablets by mouth 2 (two) times daily as needed for moderate pain.   Marland Kitchen ipratropium-albuterol (DUONEB) 0.5-2.5 (3) MG/3ML SOLN Inhale 3 mLs into the lungs every 4 (four) hours as needed (for wheezing/shortness of breath).   . LORazepam (ATIVAN) 1 MG tablet Take 1 tablet (1 mg total) by mouth every 8 (eight) hours.  . Multiple Vitamins-Minerals (MULTIVITAMIN GUMMIES ADULT) CHEW Chew 1 each by mouth daily.  . pantoprazole (PROTONIX) 20 MG tablet Take 20 mg by mouth daily.  . predniSONE (STERAPRED UNI-PAK 21 TAB) 10 MG (21) TBPK tablet Take 1 tablet (10 mg total) by mouth daily. Start at  taper by  until complete  . Simethicone (ALKA-SELTZER ANTI-GAS PO) Take 2 each by mouth as needed (for indigestion/heartburn).  . tiotropium (SPIRIVA HANDIHALER) 18 MCG inhalation capsule Place 1 capsule (18 mcg total) into inhaler and inhale daily.  Marland Kitchen zolpidem (AMBIEN) 10 MG tablet Take 10 mg by mouth at bedtime.    No facility-administered encounter medications on file as of 03/04/2016.     Allergies:  Penicillins and Codeine  Review of Systems: Gen:  Denies  fever, sweats. HEENT: Denies blurred vision. Cvc:  No dizziness, chest pain or heaviness Resp:   Denies cough or sputum porduction. Gi: Denies swallowing difficulty, stomach pain. constipation, bowel incontinence Gu:  Denies bladder incontinence, burning urine Ext:   No Joint pain, stiffness. Skin: No skin rash, easy bruising. Endoc:  No polyuria, polydipsia. Psych: No depression, insomnia. Other:  All other systems were reviewed and found to be negative other than what is mentioned in the HPI.   Physical Examination:   VS: There were no vitals taken for this visit.  General Appearance: No distress  Neuro:without focal findings,  speech normal,  HEENT: PERRLA, EOM intact. Pulmonary: normal breath sounds, No wheezing.   CardiovascularNormal S1,S2.  No m/r/g.   Abdomen: Benign, Soft, non-tender. Renal:  No costovertebral tenderness  GU:  Not performed at this time. Endoc: No evident thyromegaly, no signs of acromegaly. Skin:   warm, no rash. Extremities: normal, no cyanosis, clubbing.   LABORATORY PANEL:   CBC No results for input(s): WBC, HGB, HCT, PLT in the last 168 hours. ------------------------------------------------------------------------------------------------------------------  Chemistries  No results for input(s): NA, K, CL, CO2, GLUCOSE, BUN, CREATININE, CALCIUM, MG, AST, ALT, ALKPHOS, BILITOT in the last 168 hours.  Invalid input(s): GFRCGP ------------------------------------------------------------------------------------------------------------------  Cardiac Enzymes No results for input(s): TROPONINI in the last 168 hours. ------------------------------------------------------------  RADIOLOGY:   No  results found for this or any previous visit. Results for orders placed during the hospital encounter  of 02/05/16  DG Chest 2 View   Narrative CLINICAL DATA:  50 year old female with 1 week history of productive cough  EXAM: CHEST  2 VIEW  COMPARISON:  Recent chest CT 01/31/2016; prior chest x-ray 12/05/2015  FINDINGS: Chain sutures noted in the left upper lobe consistent with left upper lobe wedge resection. Biapical pleural parenchymal scarring greater on the right than the left is similar compared to prior imaging. Stable hyperinflation with mild central airway thickening and interstitial prominence. No new focal airspace consolidation, pulmonary edema, pleural effusion or pneumothorax. Cardiac and mediastinal contours remain within normal limits. Atherosclerotic calcifications in the transverse aorta. No acute osseous abnormality.  IMPRESSION: Stable chest x-ray without evidence of acute cardiopulmonary process.   Electronically Signed   By: Malachy MoanHeath  McCullough M.D.   On: 02/05/2016 10:33    ------------------------------------------------------------------------------------------------------------------  Thank  you for allowing [facility] to assist in the care of your patient. Our recommendations are noted above.  Please contact us if we can be of further service.   Wells Guileseep Rether Rison, MD.  [facility] Office Number: [phone]  Santiago Gladavid Kasa, M.D.  Stephanie AcreVishal Mungal, M.D.  Billy Fischeravid Simonds, M.D  03/04/2016   This encounter was created in error - please disregard.

## 2016-03-20 ENCOUNTER — Encounter: Payer: Self-pay | Admitting: Internal Medicine

## 2016-03-20 ENCOUNTER — Ambulatory Visit (INDEPENDENT_AMBULATORY_CARE_PROVIDER_SITE_OTHER): Payer: Medicare Other | Admitting: Internal Medicine

## 2016-03-20 ENCOUNTER — Telehealth: Payer: Self-pay | Admitting: Internal Medicine

## 2016-03-20 VITALS — BP 144/86 | HR 101 | Ht 59.0 in | Wt 114.0 lb

## 2016-03-20 DIAGNOSIS — J441 Chronic obstructive pulmonary disease with (acute) exacerbation: Secondary | ICD-10-CM

## 2016-03-20 MED ORDER — THEOPHYLLINE ER 200 MG PO TB12: 200.0000 mg | ORAL_TABLET | Freq: Two times a day (BID) | ORAL | Status: DC

## 2016-03-20 MED ORDER — BUDESONIDE-FORMOTEROL FUMARATE 160-4.5 MCG/ACT IN AERO: 2.0000 | INHALATION_SPRAY | Freq: Every day | RESPIRATORY_TRACT | Status: DC

## 2016-03-20 MED ORDER — IPRATROPIUM-ALBUTEROL 0.5-2.5 (3) MG/3ML IN SOLN
3.0000 mL | Freq: Four times a day (QID) | RESPIRATORY_TRACT | Status: DC
  Administered 2016-03-20: 3 mL via RESPIRATORY_TRACT

## 2016-03-20 MED ORDER — DOXYCYCLINE HYCLATE 200 MG PO TBEC: 200.0000 mg | DELAYED_RELEASE_TABLET | Freq: Two times a day (BID) | ORAL | Status: DC

## 2016-03-20 MED ORDER — MOMETASONE FURO-FORMOTEROL FUM 200-5 MCG/ACT IN AERO: 2.0000 | INHALATION_SPRAY | Freq: Two times a day (BID) | RESPIRATORY_TRACT | Status: DC

## 2016-03-20 MED ORDER — PREDNISONE 10 MG PO TABS: ORAL_TABLET | ORAL | Status: DC

## 2016-03-20 NOTE — Patient Instructions (Addendum)
Theophylline 200 mg twice daily.   Doxycyline 200 mg bid for 5 days.   You must not use your nebulizer more than every 4 hours, if you are needing  it more than that you should go to the hospital.   Prescribe dulera 200-2 puffs bid, rinse mouth after each use. If not able to afford, we will give you samples every month as they are available.   You must stop smoking.   Prednisone 10 mg tabs x 42. Take 6 tabs per day for 2 days,  Take 5 tabs per day for 2 days.  Take 4  tabs per day for 2 days.  Take 3 tabs per day for 2 days.  Take 2 tabs per day for 2 days.  Take 1 tab per day for 2 days.  Then stop.

## 2016-03-20 NOTE — Telephone Encounter (Signed)
Pharmacy wanted clarify the Doxycycline dose, medication confirmed, nothing further needed.

## 2016-03-20 NOTE — Progress Notes (Signed)
[facility]* [facility]     Assessment and Plan:  50 years old female with history of COPD, BOOP, CAD, emphysema hypertension, left open upper lobe surgery now status post hospitalization for COPD exacerbation with bronchiolitis.  AECOPD, with acute bronchitis..  -The patient has diffuse expiratory wheezing on physical exam today, she has dyspnea but does not appear to be in respiratory distress.-The patient was administered a nebulizer treatment today -We discussed that that she is already on oral prednisone 30 mg daily, I'm not sure she will get better with another oral prednisone course. 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 -We'll start patient on theophylline 200 mg by mouth twice a day. -We'll prescribe Dulera, she is advised that if this cannot be afforded, we will try to provide her breathing samples is available. -We'll prescribe a 12 day prednisone taper, starting at 60 mg daily. -We'll prescribe the patient doxycycline for 5 day course. -Patient is at high risk of returning to the hospital due to her COPD exacerbation.  Nicotine abuse.  -Discussed importance of nicotine cessation, as the smoking is making her breathing worse.  CT lung cancer screening.  -The patient has been enrolled in CT lung cancer screening, we'll continue to monitor.  Date: 03/20/2016  MRN# [id number] Caitlyn Evans 1966/03/14   Caitlyn Evans is a 50 y.o. old female seen in follow up for chief complaint of  Chief Complaint  Patient presents with  . Hospitalization Follow-up    breathing has worsen since hosp. [facility] & feels cold has gotten into her chest. c/o sob, wheezing, non prod cough, chest tightness.      HPI:   She has been wheezing And having worsening dyspnea much more  for the past 2 weeks, she has run out symbicort 1 week ago. She has been using duonebs 8 times per day, 2 so far this am. She has dyspnea on mild to moderate exertion. She has having trouble affording her medications, her medications for inhalers are costing her $200 and she has not been able to fill the prescriptions. She is smoking about half ppd, she has 2 dogs in the home. No one at home smokes.  She is currently taking prednisone 20 mg in am and 10 mg in evening.      Medication:   Outpatient Encounter Prescriptions as of 03/20/2016  Medication Sig  . benzonatate (TESSALON) 200 MG capsule Take 1 capsule (200 mg total) by mouth 3 (three) times daily as needed for cough. (Patient not taking: Reported on 02/05/2016)  . Fluticasone-Salmeterol (ADVAIR DISKUS) 250-50 MCG/DOSE AEPB Inhale 1 puff into the lungs 2 (two) times daily.  Marland Kitchen. guaiFENesin-dextromethorphan (ROBITUSSIN DM) 100-10 MG/5ML syrup Take 5 mLs by mouth every 4 (four) hours as needed for cough.  Marland Kitchen. HYDROcodone-acetaminophen (NORCO) 10-325 MG tablet Take 0.5 tablets by mouth 2 (two) times daily as needed for moderate pain.   Marland Kitchen. ipratropium-albuterol (DUONEB) 0.5-2.5 (3) MG/3ML SOLN Inhale 3 mLs into the lungs every 4 (four) hours as needed (for wheezing/shortness of breath).   . LORazepam (ATIVAN) 1 MG tablet Take 1 tablet (1 mg total) by mouth every 8 (eight) hours.  . Multiple Vitamins-Minerals (MULTIVITAMIN GUMMIES ADULT) CHEW Chew 1 each by mouth daily.  . pantoprazole (  PROTONIX) 20 MG tablet Take 20 mg by mouth daily.  . predniSONE (STERAPRED UNI-PAK 21 TAB) 10 MG (21) TBPK tablet Take 1 tablet (10 mg total) by mouth daily. Start at 60mg  taper by 10mg  until complete  . Simethicone (ALKA-SELTZER ANTI-GAS PO) Take 2 each by mouth as needed (for indigestion/heartburn).  . tiotropium (SPIRIVA HANDIHALER) 18 MCG inhalation capsule Place 1 capsule (18 mcg total) into inhaler and inhale daily.  Marland Kitchen. zolpidem (AMBIEN) 10 MG tablet Take 10 mg by  mouth at bedtime.   No facility-administered encounter medications on file as of 03/20/2016.     Allergies:  Penicillins and Codeine  Review of Systems: Gen:  Denies  fever, sweats. HEENT: Denies blurred vision. Cvc:  No dizziness, chest pain or heaviness Resp:   Denies cough or sputum porduction. Gi: Denies swallowing difficulty, stomach pain. constipation, bowel incontinence Gu:  Denies bladder incontinence, burning urine Ext:   No Joint pain, stiffness. Skin: No skin rash, easy bruising. Endoc:  No polyuria, polydipsia. Psych: No depression, insomnia. Other:  All other systems were reviewed and found to be negative other than what is mentioned in the HPI.   Physical Examination:   VS: BP 144/86 mmHg  Pulse 101  Ht 4\' 11"  (1.499 m)  Wt 114 lb (51.71 kg)  BMI 23.01 kg/m2  SpO2 97%  General Appearance: Patient appears dyspneic. Neuro:without focal findings,  speech normal,  HEENT: PERRLA, EOM intact. Pulmonary: normal breath sounds, Diffuse bilateral expiratory wheezing.   CardiovascularNormal S1,S2.  No m/r/g.   Abdomen: Benign, Soft, non-tender. Renal:  No costovertebral tenderness  GU:  Not performed at this time. Endoc: No evident thyromegaly, no signs of acromegaly. Skin:   warm, no rash. Extremities: normal, no cyanosis, clubbing.   LABORATORY PANEL:   CBC No results for input(s): WBC, HGB, HCT, PLT in the last 168 hours. ------------------------------------------------------------------------------------------------------------------  Chemistries  No results for input(s): NA, K, CL, CO2, GLUCOSE, BUN, CREATININE, CALCIUM, MG, AST, ALT, ALKPHOS, BILITOT in the last 168 hours.  Invalid input(s): GFRCGP ------------------------------------------------------------------------------------------------------------------  Cardiac Enzymes No results for input(s): TROPONINI in the last 168  hours. ------------------------------------------------------------  RADIOLOGY:   No results found for this or any previous visit. Results for orders placed during the hospital encounter of 02/05/16  DG Chest 2 View   Narrative CLINICAL DATA:  50 year old female with 1 week history of productive cough  EXAM: CHEST  2 VIEW  COMPARISON:  Recent chest CT 01/31/2016; prior chest x-ray 12/05/2015  FINDINGS: Chain sutures noted in the left upper lobe consistent with left upper lobe wedge resection. Biapical pleural parenchymal scarring greater on the right than the left is similar compared to prior imaging. Stable hyperinflation with mild central airway thickening and interstitial prominence. No new focal airspace consolidation, pulmonary edema, pleural effusion or pneumothorax. Cardiac and mediastinal contours remain within normal limits. Atherosclerotic calcifications in the transverse aorta. No acute osseous abnormality.  IMPRESSION: Stable chest x-ray without evidence of acute cardiopulmonary process.   Electronically Signed   By: Malachy MoanHeath  McCullough M.D.   On: 02/05/2016 10:33    ------------------------------------------------------------------------------------------------------------------  [facility]  [facility] to assist in the care of your patient. Our recommendations are noted above.  Please contact us if we can be of further service.   Wells Guileseep Corrion Stirewalt, MD.  [facility] Office Number: [phone]  Santiago Gladavid Kasa, M.D.  Stephanie AcreVishal Mungal, M.D.  Billy Fischeravid Simonds, M.D  03/20/2016

## 2016-03-20 NOTE — Telephone Encounter (Signed)
[phone] is wanting to verify the Rx - Doxycycline Hyclate 200 MG TBEC twice a day. Please call.

## 2016-05-09 ENCOUNTER — Emergency Department: Payer: Medicare Other

## 2016-05-09 ENCOUNTER — Encounter: Payer: Self-pay | Admitting: Emergency Medicine

## 2016-05-09 ENCOUNTER — Inpatient Hospital Stay
Admission: EM | Admit: 2016-05-09 | Discharge: 2016-05-13 | DRG: 190 | Disposition: A | Discharge status: Home / Self Care | Payer: Medicare Other | Attending: Internal Medicine | Admitting: Internal Medicine

## 2016-05-09 DIAGNOSIS — J9621 Acute and chronic respiratory failure with hypoxia: Secondary | ICD-10-CM | POA: Diagnosis present

## 2016-05-09 DIAGNOSIS — Z8249 Family history of ischemic heart disease and other diseases of the circulatory system: Secondary | ICD-10-CM

## 2016-05-09 DIAGNOSIS — J9620 Acute and chronic respiratory failure, unspecified whether with hypoxia or hypercapnia: Secondary | ICD-10-CM | POA: Diagnosis present

## 2016-05-09 DIAGNOSIS — F17210 Nicotine dependence, cigarettes, uncomplicated: Secondary | ICD-10-CM | POA: Diagnosis present

## 2016-05-09 DIAGNOSIS — J9622 Acute and chronic respiratory failure with hypercapnia: Secondary | ICD-10-CM | POA: Diagnosis not present

## 2016-05-09 DIAGNOSIS — I252 Old myocardial infarction: Secondary | ICD-10-CM

## 2016-05-09 DIAGNOSIS — Z90710 Acquired absence of both cervix and uterus: Secondary | ICD-10-CM | POA: Diagnosis not present

## 2016-05-09 DIAGNOSIS — I2510 Atherosclerotic heart disease of native coronary artery without angina pectoris: Secondary | ICD-10-CM | POA: Diagnosis present

## 2016-05-09 DIAGNOSIS — Z825 Family history of asthma and other chronic lower respiratory diseases: Secondary | ICD-10-CM | POA: Diagnosis not present

## 2016-05-09 DIAGNOSIS — R0600 Dyspnea, unspecified: Secondary | ICD-10-CM

## 2016-05-09 DIAGNOSIS — Z9114 Patient's other noncompliance with medication regimen: Secondary | ICD-10-CM

## 2016-05-09 DIAGNOSIS — J441 Chronic obstructive pulmonary disease with (acute) exacerbation: Secondary | ICD-10-CM | POA: Diagnosis not present

## 2016-05-09 DIAGNOSIS — J440 Chronic obstructive pulmonary disease with acute lower respiratory infection: Principal | ICD-10-CM | POA: Diagnosis present

## 2016-05-09 DIAGNOSIS — Z823 Family history of stroke: Secondary | ICD-10-CM

## 2016-05-09 DIAGNOSIS — K5900 Constipation, unspecified: Secondary | ICD-10-CM | POA: Diagnosis present

## 2016-05-09 DIAGNOSIS — Z880 Allergy status to penicillin: Secondary | ICD-10-CM

## 2016-05-09 DIAGNOSIS — Z720 Tobacco use: Secondary | ICD-10-CM | POA: Diagnosis not present

## 2016-05-09 DIAGNOSIS — Z90721 Acquired absence of ovaries, unilateral: Secondary | ICD-10-CM

## 2016-05-09 DIAGNOSIS — J9601 Acute respiratory failure with hypoxia: Secondary | ICD-10-CM | POA: Diagnosis not present

## 2016-05-09 DIAGNOSIS — I10 Essential (primary) hypertension: Secondary | ICD-10-CM | POA: Diagnosis not present

## 2016-05-09 DIAGNOSIS — R51 Headache: Secondary | ICD-10-CM | POA: Diagnosis present

## 2016-05-09 DIAGNOSIS — Z9889 Other specified postprocedural states: Secondary | ICD-10-CM

## 2016-05-09 DIAGNOSIS — Z716 Tobacco abuse counseling: Secondary | ICD-10-CM | POA: Diagnosis not present

## 2016-05-09 DIAGNOSIS — R0602 Shortness of breath: Secondary | ICD-10-CM | POA: Diagnosis not present

## 2016-05-09 DIAGNOSIS — J209 Acute bronchitis, unspecified: Secondary | ICD-10-CM | POA: Diagnosis present

## 2016-05-09 DIAGNOSIS — Z7952 Long term (current) use of systemic steroids: Secondary | ICD-10-CM | POA: Diagnosis not present

## 2016-05-09 DIAGNOSIS — J8489 Other specified interstitial pulmonary diseases: Secondary | ICD-10-CM | POA: Diagnosis present

## 2016-05-09 DIAGNOSIS — Z91148 Patient's other noncompliance with medication regimen for other reason: Secondary | ICD-10-CM

## 2016-05-09 LAB — GLUCOSE, CAPILLARY
Glucose-Capillary: 138 mg/dL — ABNORMAL HIGH (ref 65–99)
Glucose-Capillary: 171 mg/dL — ABNORMAL HIGH (ref 65–99)

## 2016-05-09 LAB — CBC WITH DIFFERENTIAL/PLATELET
BASOS ABS: 0.1 10*3/uL (ref 0–0.1)
BASOS PCT: 1 %
EOS ABS: 0 10*3/uL (ref 0–0.7)
EOS PCT: 0 %
HCT: 43.4 % (ref 35.0–47.0)
Hemoglobin: 15 g/dL (ref 12.0–16.0)
Lymphocytes Relative: 11 %
Lymphs Abs: 1.4 10*3/uL (ref 1.0–3.6)
MCH: 29.9 pg (ref 26.0–34.0)
MCHC: 34.5 g/dL (ref 32.0–36.0)
MCV: 86.8 fL (ref 80.0–100.0)
MONO ABS: 0.8 10*3/uL (ref 0.2–0.9)
Monocytes Relative: 7 %
Neutro Abs: 9.9 10*3/uL — ABNORMAL HIGH (ref 1.4–6.5)
Neutrophils Relative %: 81 %
PLATELETS: 274 10*3/uL (ref 150–440)
RBC: 4.99 MIL/uL (ref 3.80–5.20)
RDW: 17.5 % — AB (ref 11.5–14.5)
WBC: 12.2 10*3/uL — ABNORMAL HIGH (ref 3.6–11.0)

## 2016-05-09 LAB — COMPREHENSIVE METABOLIC PANEL
ALBUMIN: 4.4 g/dL (ref 3.5–5.0)
ALK PHOS: 79 U/L (ref 38–126)
ALT: 18 U/L (ref 14–54)
ANION GAP: 10 (ref 5–15)
AST: 26 U/L (ref 15–41)
BILIRUBIN TOTAL: 0.5 mg/dL (ref 0.3–1.2)
BUN: 12 mg/dL (ref 6–20)
CALCIUM: 9.7 mg/dL (ref 8.9–10.3)
CO2: 24 mmol/L (ref 22–32)
CREATININE: 0.67 mg/dL (ref 0.44–1.00)
Chloride: 109 mmol/L (ref 101–111)
GFR calc Af Amer: >60 mL/min (ref >60)
GFR calc non Af Amer: >60 mL/min (ref >60)
GLUCOSE: 98 mg/dL (ref 65–99)
Potassium: 3.7 mmol/L (ref 3.5–5.1)
Sodium: 143 mmol/L (ref 135–145)
TOTAL PROTEIN: 7.5 g/dL (ref 6.5–8.1)

## 2016-05-09 LAB — LACTIC ACID, PLASMA
LACTIC ACID, VENOUS: 0.7 mmol/L (ref 0.5–1.9)
Lactic Acid, Venous: 2.3 mmol/L (ref 0.5–1.9)

## 2016-05-09 LAB — TROPONIN I: Troponin I: <0.03 ng/mL

## 2016-05-09 LAB — MRSA PCR SCREENING: MRSA BY PCR: NEGATIVE

## 2016-05-09 MED ORDER — SODIUM CHLORIDE 0.9% FLUSH
3.0000 mL | INTRAVENOUS | Status: DC | PRN
  Administered 2016-05-11: 3 mL via INTRAVENOUS
  Filled 2016-05-09: qty 3

## 2016-05-09 MED ORDER — ALBUTEROL SULFATE (2.5 MG/3ML) 0.083% IN NEBU
2.5000 mg | INHALATION_SOLUTION | RESPIRATORY_TRACT | Status: DC | PRN
  Administered 2016-05-09 – 2016-05-11 (×3): 2.5 mg via RESPIRATORY_TRACT
  Filled 2016-05-09 (×3): qty 3

## 2016-05-09 MED ORDER — IPRATROPIUM BROMIDE 0.02 % IN SOLN
0.5000 mg | Freq: Once | RESPIRATORY_TRACT | Status: AC
  Administered 2016-05-09: 0.5 mg via RESPIRATORY_TRACT

## 2016-05-09 MED ORDER — METHYLPREDNISOLONE SODIUM SUCC 125 MG IJ SOLR
INTRAMUSCULAR | Status: AC
  Administered 2016-05-09: 125 mg via INTRAVENOUS
  Filled 2016-05-09: qty 2

## 2016-05-09 MED ORDER — LORAZEPAM 2 MG/ML IJ SOLN
INTRAMUSCULAR | Status: AC
  Filled 2016-05-09: qty 1

## 2016-05-09 MED ORDER — LEVALBUTEROL HCL 1.25 MG/0.5ML IN NEBU
INHALATION_SOLUTION | RESPIRATORY_TRACT | Status: AC
  Filled 2016-05-09: qty 0.5

## 2016-05-09 MED ORDER — IPRATROPIUM-ALBUTEROL 0.5-2.5 (3) MG/3ML IN SOLN
3.0000 mL | Freq: Four times a day (QID) | RESPIRATORY_TRACT | Status: DC
  Administered 2016-05-09 – 2016-05-11 (×7): 3 mL via RESPIRATORY_TRACT
  Filled 2016-05-09 (×7): qty 3

## 2016-05-09 MED ORDER — METHYLPREDNISOLONE SODIUM SUCC 125 MG IJ SOLR
125.0000 mg | Freq: Once | INTRAMUSCULAR | Status: AC
  Administered 2016-05-09: 125 mg via INTRAVENOUS

## 2016-05-09 MED ORDER — LEVALBUTEROL HCL 0.63 MG/3ML IN NEBU
0.6300 mg | INHALATION_SOLUTION | Freq: Once | RESPIRATORY_TRACT | Status: AC
  Administered 2016-05-09: 0.63 mg via RESPIRATORY_TRACT

## 2016-05-09 MED ORDER — ALPRAZOLAM 0.25 MG PO TABS
0.2500 mg | ORAL_TABLET | Freq: Three times a day (TID) | ORAL | Status: DC | PRN
  Administered 2016-05-09: 0.25 mg via ORAL
  Filled 2016-05-09 (×3): qty 1

## 2016-05-09 MED ORDER — IOPAMIDOL (ISOVUE-370) INJECTION 76%
60.0000 mL | Freq: Once | INTRAVENOUS | Status: AC | PRN
  Administered 2016-05-09: 60 mL via INTRAVENOUS

## 2016-05-09 MED ORDER — LORAZEPAM 2 MG/ML IJ SOLN
INTRAMUSCULAR | Status: AC
  Administered 2016-05-09: 1 mg via INTRAVENOUS
  Filled 2016-05-09: qty 1

## 2016-05-09 MED ORDER — CHLORHEXIDINE GLUCONATE 0.12 % MT SOLN
15.0000 mL | Freq: Two times a day (BID) | OROMUCOSAL | Status: DC
  Administered 2016-05-11: 15 mL via OROMUCOSAL
  Filled 2016-05-09 (×2): qty 15

## 2016-05-09 MED ORDER — SODIUM CHLORIDE 0.9 % IV SOLN: 250.0000 mL | INTRAVENOUS | Status: DC | PRN

## 2016-05-09 MED ORDER — ACETAMINOPHEN 325 MG PO TABS
650.0000 mg | ORAL_TABLET | ORAL | Status: DC | PRN
  Administered 2016-05-09 – 2016-05-10 (×4): 650 mg via ORAL
  Filled 2016-05-09 (×4): qty 2

## 2016-05-09 MED ORDER — LORAZEPAM 2 MG/ML IJ SOLN
1.0000 mg | Freq: Once | INTRAMUSCULAR | Status: AC
  Administered 2016-05-09: 1 mg via INTRAVENOUS

## 2016-05-09 MED ORDER — METHYLPREDNISOLONE SODIUM SUCC 125 MG IJ SOLR
80.0000 mg | Freq: Two times a day (BID) | INTRAMUSCULAR | Status: DC
  Administered 2016-05-09 – 2016-05-13 (×8): 80 mg via INTRAVENOUS
  Filled 2016-05-09 (×7): qty 2

## 2016-05-09 MED ORDER — IPRATROPIUM-ALBUTEROL 0.5-2.5 (3) MG/3ML IN SOLN
3.0000 mL | Freq: Once | RESPIRATORY_TRACT | Status: AC
Start: 2016-05-09 — End: 2016-05-09

## 2016-05-09 MED ORDER — INSULIN ASPART 100 UNIT/ML ~~LOC~~ SOLN
0.0000 [IU] | Freq: Three times a day (TID) | SUBCUTANEOUS | Status: DC
  Administered 2016-05-09 – 2016-05-10 (×3): 2 [IU] via SUBCUTANEOUS
  Administered 2016-05-11: 3 [IU] via SUBCUTANEOUS
  Administered 2016-05-13: 2 [IU] via SUBCUTANEOUS
  Filled 2016-05-09 (×2): qty 2
  Filled 2016-05-09: qty 3
  Filled 2016-05-09 (×2): qty 2

## 2016-05-09 MED ORDER — ONDANSETRON HCL 4 MG/2ML IJ SOLN
4.0000 mg | Freq: Four times a day (QID) | INTRAMUSCULAR | Status: DC | PRN
  Administered 2016-05-12: 4 mg via INTRAVENOUS
  Filled 2016-05-09: qty 2

## 2016-05-09 MED ORDER — ALPRAZOLAM 0.25 MG PO TABS
0.2500 mg | ORAL_TABLET | Freq: Once | ORAL | Status: AC
  Administered 2016-05-09: 0.25 mg via ORAL
  Filled 2016-05-09: qty 1

## 2016-05-09 MED ORDER — AMLODIPINE BESYLATE 5 MG PO TABS
5.0000 mg | ORAL_TABLET | Freq: Every day | ORAL | Status: DC
  Administered 2016-05-10 – 2016-05-13 (×4): 5 mg via ORAL
  Filled 2016-05-09 (×4): qty 1

## 2016-05-09 MED ORDER — IPRATROPIUM-ALBUTEROL 0.5-2.5 (3) MG/3ML IN SOLN
RESPIRATORY_TRACT | Status: AC
  Filled 2016-05-09: qty 3

## 2016-05-09 MED ORDER — ENOXAPARIN SODIUM 40 MG/0.4ML ~~LOC~~ SOLN
40.0000 mg | SUBCUTANEOUS | Status: DC
  Filled 2016-05-09 (×3): qty 0.4

## 2016-05-09 MED ORDER — IPRATROPIUM BROMIDE 0.02 % IN SOLN
RESPIRATORY_TRACT | Status: AC
  Administered 2016-05-09: 0.5 mg via RESPIRATORY_TRACT
  Filled 2016-05-09: qty 2.5

## 2016-05-09 MED ORDER — SODIUM CHLORIDE 0.9% FLUSH
3.0000 mL | Freq: Two times a day (BID) | INTRAVENOUS | Status: DC
Start: 2016-05-09 — End: 2016-05-13
  Administered 2016-05-09 – 2016-05-13 (×9): 3 mL via INTRAVENOUS

## 2016-05-09 MED ORDER — DOXYCYCLINE HYCLATE 100 MG PO TABS
100.0000 mg | ORAL_TABLET | Freq: Two times a day (BID) | ORAL | Status: DC
  Administered 2016-05-09 – 2016-05-13 (×8): 100 mg via ORAL
  Filled 2016-05-09 (×8): qty 1

## 2016-05-09 MED ORDER — CETYLPYRIDINIUM CHLORIDE 0.05 % MT LIQD
7.0000 mL | Freq: Two times a day (BID) | OROMUCOSAL | Status: DC
  Administered 2016-05-09: 7 mL via OROMUCOSAL

## 2016-05-09 MED ORDER — BUDESONIDE 0.25 MG/2ML IN SUSP
0.2500 mg | Freq: Four times a day (QID) | RESPIRATORY_TRACT | Status: DC
  Administered 2016-05-09 – 2016-05-11 (×7): 0.25 mg via RESPIRATORY_TRACT
  Filled 2016-05-09 (×8): qty 2

## 2016-05-09 MED ORDER — PANTOPRAZOLE SODIUM 40 MG PO TBEC
40.0000 mg | DELAYED_RELEASE_TABLET | Freq: Every day | ORAL | Status: DC
  Administered 2016-05-09: 40 mg via ORAL
  Filled 2016-05-09: qty 1

## 2016-05-09 MED ORDER — BUDESONIDE 0.25 MG/2ML IN SUSP: 0.2500 mg | Freq: Four times a day (QID) | RESPIRATORY_TRACT | Status: DC

## 2016-05-09 NOTE — ED Provider Notes (Signed)
[facility] Emergency Department Provider Note   ____________________________________________   None    (approximate)  I have reviewed the triage vital signs and the nursing notes.   HISTORY  Chief Complaint Shortness of Breath    HPI Valeen Judie Petit Hara is a 50 y.o. female patient is a history of COPD and BOOP. Patient complains of several days of increasing shortness of breath and nonproductive cough. Patient is not running a fever. Patient reports she gets like this fairly frequently has to come in the hospital. Patient's been on BiPAP before. Patient is in respiratory distress to Make an tachycardic her O2 sats are 99% she is not on oxygen she reports she never gets hypoxic. She she says she can't breathe however.   Past Medical History:  Diagnosis Date  . Anxiety    panic attacks  . BOOP (bronchiolitis obliterans with organizing pneumonia) (HCC)   . Dysphagia, pharyngoesophageal phase 05/30/2015  . Emphysema lung (HCC) 05/30/2015  . Hypercholesterolemia   . Lung mass   . Migraines   . Motion sickness    all moving vehicles  . Myocardial infarction (HCC) 2012  . Seasonal allergies   . Shortness of breath dyspnea    1 flight-stairs    Patient Active Problem List   Diagnosis Date Noted  . COPD with acute exacerbation (HCC) 02/05/2016  . Acute exacerbation of chronic obstructive pulmonary disease (COPD) (HCC)   . Acute respiratory failure (HCC)   . Acute respiratory failure with hypoxia (HCC) 12/05/2015  . BOOP (bronchiolitis obliterans with organizing pneumonia) (HCC) 12/05/2015  . Depression, major, single episode, moderate (HCC) 07/14/2015  . Adjustment disorder with anxiety 07/14/2015  . COPD exacerbation (HCC) 07/14/2015  . Special screening for malignant neoplasms, colon   . Swallowing difficulty   . Loss of weight   . Esophagogastric ulcer   . Esophageal ulcer   . Hypertension 05/30/2015  . Emphysema lung (HCC) 05/30/2015  .  Dysphagia, pharyngoesophageal phase 05/30/2015    Past Surgical History:  Procedure Laterality Date  . COLONOSCOPY WITH PROPOFOL N/A 06/02/2015   Procedure: COLONOSCOPY WITH PROPOFOL;  Surgeon: Midge Minium, MD;  Location: [facility];  Service: Endoscopy;  Laterality: N/A;  . ESOPHAGOGASTRODUODENOSCOPY (EGD) WITH PROPOFOL N/A 06/02/2015   Procedure: ESOPHAGOGASTRODUODENOSCOPY (EGD) WITH PROPOFOL withdialation;  Surgeon: Midge Minium, MD;  Location: [facility];  Service: Endoscopy;  Laterality: N/A;  . LUNG SURGERY Left    Upper lobe removed  . OOPHORECTOMY Left   . VAGINAL HYSTERECTOMY      Prior to Admission medications   Medication Sig Start Date End Date Taking? Authorizing Provider  ALPRAZolam (XANAX) 0.25 MG tablet Take 0.25 mg by mouth 2 (two) times daily as needed for anxiety.   Yes Historical Provider, MD  budesonide-formoterol (SYMBICORT) 160-4.5 MCG/ACT inhaler Inhale 2 puffs into the lungs daily. 03/20/16 05/09/16 Yes Shane Crutch, MD  ipratropium-albuterol (DUONEB) 0.5-2.5 (3) MG/3ML SOLN Inhale 3 mLs into the lungs every 4 (four) hours as needed (for wheezing/shortness of breath).    Yes Historical Provider, MD  LORazepam (ATIVAN) 1 MG tablet Take 1 tablet (1 mg total) by mouth every 8 (eight) hours. 02/08/16  Yes Auburn Bilberry, MD  LORazepam (ATIVAN) 1 MG tablet Take 1 mg by mouth 2 (two) times daily.   Yes Historical Provider, MD  mometasone-formoterol (DULERA) 200-5 MCG/ACT AERO Inhale 2 puffs into the lungs 2 (two) times daily. 03/20/16 05/09/16 Yes Shane Crutch, MD  Multiple Vitamins-Minerals (MULTIVITAMIN GUMMIES ADULT) CHEW Dorna Bloom  1 each by mouth daily.   Yes Historical Provider, MD  pantoprazole (PROTONIX) 40 MG tablet Take 40 mg by mouth daily.   Yes Historical Provider, MD  predniSONE (DELTASONE) 10 MG tablet 6 tabs X 2 days, 5 tabs X 2 days, 4 tabs X 2 days, 3 tabs X 2 days, 2 tabs X 2 days, 1 tab X 2 days then stop 03/20/16  Yes Shane CrutchPradeep  Ramachandran, MD  amLODipine (NORVASC) 5 MG tablet  10/02/10   Historical Provider, MD  Doxycycline Hyclate 200 MG TBEC Take 200 mg by mouth 2 (two) times daily. Patient not taking: Reported on 05/09/2016 03/20/16   Shane CrutchPradeep Ramachandran, MD  pantoprazole (PROTONIX) 20 MG tablet Take 20 mg by mouth daily.    Historical Provider, MD  theophylline (THEODUR) 200 MG 12 hr tablet Take 1 tablet (200 mg total) by mouth 2 (two) times daily. Patient not taking: Reported on 05/09/2016 03/20/16   Shane CrutchPradeep Ramachandran, MD    Allergies Penicillins and Codeine  Family History  Problem Relation Age of Onset  . COPD Mother   . Heart attack Mother   . Stroke Father   . COPD Father     Social History Social History  Substance Use Topics  . Smoking status: Current Every Day Smoker    Packs/day: 1.00    Years: 30.00  . Smokeless tobacco: Never Used     Comment: quit some time in 2012  . Alcohol use No     Comment: rare consumption    Review of Systems Constitutional: No fever/chills Eyes: No visual changes. ENT: No sore throat. Cardiovascular: Denies chest pain. Respiratory: shortness of breath. Gastrointestinal: No abdominal pain.  No nausea, no vomiting.  No diarrhea.  No constipation. Genitourinary: Negative for dysuria. Musculoskeletal: Negative for back pain. Skin: Negative for rash. Neurological: Negative for headaches, focal weakness or numbness.  10-point ROS otherwise negative.  ____________________________________________   PHYSICAL EXAM:  VITAL SIGNS: ED Triage Vitals  Enc Vitals Group     BP 05/09/16 1007 (!) 171/115     Pulse Rate 05/09/16 1007 (!) 129     Resp 05/09/16 1007 (!) 27     Temp 05/09/16 1007 97.8 F (36.6 C)     Temp Source 05/09/16 1007 Oral     SpO2 05/09/16 1007 99 %     Weight 05/09/16 1008 114 lb (51.7 kg)     Height 05/09/16 1008 4\' 11"  (1.499 m)     Head Circumference --      Peak Flow --      Pain Score 05/09/16 1004 8     Pain Loc --      Pain  Edu? --      Excl. in GC? --     Constitutional: Alert and oriented. In respiratory distress Eyes: Conjunctivae are normal. PERRL. EOMI. Head: Atraumatic. Nose: No congestion/rhinnorhea. Mouth/Throat: Mucous membranes are moist.  Oropharynx non-erythematous. Neck: No stridor.   Cardiovascular: Tachycardic, regular rhythm. Grossly normal heart sounds.  Good peripheral circulation. Respiratory: Markedly increased respiratory rate with accessory muscle use and retractions. Lungs are tight Gastrointestinal: Soft and nontender. No distention. No abdominal bruits. No CVA tenderness. Musculoskeletal: No lower extremity tenderness nor edema.  No joint effusions. Neurologic:  Normal speech and language. No gross focal neurologic deficits are appreciated. No gait instability. Skin:  Skin is warm, dry and intact. No rash noted. Psychiatric: Mood and affect are normal. Speech and behavior are normal.  ____________________________________________   LABS (all labs ordered are  listed, but only abnormal results are displayed)  Labs Reviewed  CBC WITH DIFFERENTIAL/PLATELET - Abnormal; Notable for the following:       Result Value   WBC 12.2 (*)    RDW 17.5 (*)    Neutro Abs 9.9 (*)    All other components within normal limits  LACTIC ACID, PLASMA - Abnormal; Notable for the following:    Lactic Acid, Venous 2.3 (*)    All other components within normal limits  COMPREHENSIVE METABOLIC PANEL  TROPONIN I  LACTIC ACID, PLASMA   ____________________________________________  EKG  EKG read and interpreted by me shows sinus tachycardia rate of 123 normal axis no acute ST-T wave changes ____________________________________________  RADIOLOGY  Chest x-ray and CT angiogram did not show any obstruction or acute pathology. ____________________________________________   PROCEDURES  Procedure(s) performed: Patient taken off BiPAP immediately begins getting more tachycardic and wheezing although  she had been okay on the BiPAP Procedures  Critical Care performed: Critical care time half an hour including talking to patient repeatedly healing studies and working with the BiPAp  Clinical Course     ____________________________________________   FINAL CLINICAL IMPRESSION(S) / ED DIAGNOSES  Final diagnoses:  COPD exacerbation (HCC)  Dyspnea      NEW MEDICATIONS STARTED DURING THIS VISIT:  New Prescriptions   No medications on file     Note:  This document was prepared using Dragon voice recognition software and may include unintentional dictation errors.    Arnaldo NatalPaul F Aviela Blundell, MD 05/09/16 1344

## 2016-05-09 NOTE — ED Triage Notes (Signed)
Patient presents to the ED with increasing shortness of breath over the past 4 days.  Patient reports history of COPD and asthma.  Patient reports using four breathing treatments this morning.  Patient is having difficulty speaking in full sentences, obvious shortness of breath with tripoding posture.

## 2016-05-09 NOTE — Progress Notes (Signed)
MEDICATION RELATED CONSULT NOTE - INITIAL   Pharmacy Consult for antibiotic renal adjustment Indication: COPD  Allergies  Allergen Reactions  . Penicillins Hives and Other (See Comments)    Has patient had a PCN reaction causing immediate rash, facial/tongue/throat swelling, SOB or lightheadedness with hypotension: No Has patient had a PCN reaction causing severe rash involving mucus membranes or skin necrosis: No Has patient had a PCN reaction that required hospitalization No Has patient had a PCN reaction occurring within the last 10 years: No If all of the above answers are "NO", then may proceed with Cephalosporin use.  . Codeine Hives and Nausea And Vomiting    Patient Measurements: Height: 4\' 11"  (149.9 cm) Weight: 114 lb (51.7 kg) IBW/kg (Calculated) : 43.2   Vital Signs: Temp: 97.8 F (36.6 C) (08/24 1007) Temp Source: Oral (08/24 1007) BP: 119/71 (08/24 1600) Pulse Rate: 85 (08/24 1600) Intake/Output from previous day: No intake/output data recorded. Intake/Output from this shift: No intake/output data recorded.  Labs:  Recent Labs  05/09/16 1026  WBC 12.2*  HGB 15.0  HCT 43.4  PLT 274  CREATININE 0.67  ALBUMIN 4.4  PROT 7.5  AST 26  ALT 18  ALKPHOS 79  BILITOT 0.5   Estimated Creatinine Clearance: 56.1 mL/min (by C-G formula based on SCr of 0.8 mg/dL).   Microbiology: No results found for this or any previous visit (from the past 720 hour(s)).  Medical History: Past Medical History:  Diagnosis Date  . Anxiety    panic attacks  . BOOP (bronchiolitis obliterans with organizing pneumonia) (HCC)   . Dysphagia, pharyngoesophageal phase 05/30/2015  . Emphysema lung (HCC) 05/30/2015  . Hypercholesterolemia   . Lung mass   . Migraines   . Motion sickness    all moving vehicles  . Myocardial infarction (HCC) 2012  . Seasonal allergies   . Shortness of breath dyspnea    1 flight-stairs    Medications:  Scheduled:  . [START ON 05/10/2016]  amLODipine  5 mg Oral Daily  . budesonide (PULMICORT) nebulizer solution  0.25 mg Nebulization Q6H  . doxycycline  100 mg Oral Q12H  . enoxaparin (LOVENOX) injection  40 mg Subcutaneous Q24H  . insulin aspart  0-15 Units Subcutaneous TID WC  . ipratropium-albuterol  3 mL Nebulization Q6H  . levalbuterol      . LORazepam      . methylPREDNISolone (SOLU-MEDROL) injection  80 mg Intravenous Q12H  . pantoprazole  40 mg Oral Q1200  . sodium chloride flush  3 mL Intravenous Q12H   Infusions:   PRN: sodium chloride, sodium chloride, acetaminophen, albuterol, ALPRAZolam, ondansetron (ZOFRAN) IV, sodium chloride flush  Assessment: 50 yo female with COPD exacerbation given doxycycline PO.    Plan:  No dose adjustments are needed at this time. Continue with current therapy.   Tresa EndoKelly m Sherrica Niehaus 05/09/2016,5:09 PM

## 2016-05-09 NOTE — H&P (Signed)
PULMONARY ADMISSION NOTE   PT PROFILE: [age] F smoker followed as outpt by DR for COPD with frequent exacerbations and hx of BOOP. Admitted with acute on chronic respiratory failure due to AECOPD  HPI:  [age] F smoker with history of COPD and BOOP (biopsy proven 2012). She is followed by Dr Nicholos Johnsamachandran as outpt. She has a tendency for exacerbations of COPD with frequent hospitalizations and acute care visits. She presented to the ED on the day of admission with 4 days of worsening dyspnea that was progressive to point that she could no longer "take it". In the ED, she was found to be severely dyspneic and BiPAP was initiated as well as nebulized bronchodilators. She was tried off BiPAP and again developed distress so it was resumed and PCCM was called to admit. At the time of this evaluation, she was able to tolerate [place] O2. She denies significant sputum production, CP, fever, purulent sputum, hemoptysis, LE edema and calf tenderness. She does admit that she has not been using Symbicort andd theophylline as prescribed. She does use Duonebs 2-4 times per day and is maintained on prednisone 10 mg BID. She continues to smoke 1/2 - 1 ppd. She reports multiple life stressors contributing to her inability to quit.   Past Medical History:  Diagnosis Date  . Anxiety    panic attacks  . BOOP (bronchiolitis obliterans with organizing pneumonia) (HCC)   . Dysphagia, pharyngoesophageal phase 05/30/2015  . Emphysema lung (HCC) 05/30/2015  . Hypercholesterolemia   . Lung mass   . Migraines   . Motion sickness    all moving vehicles  . Myocardial infarction (HCC) 2012  . Seasonal allergies   . Shortness of breath dyspnea    1 flight-stairs    Past Surgical History:  Procedure Laterality Date  . COLONOSCOPY WITH PROPOFOL N/A 06/02/2015   Procedure: COLONOSCOPY WITH PROPOFOL;  Surgeon: Midge Miniumarren Wohl, MD;  [facility];  Service: Endoscopy;  Laterality: N/A;  . ESOPHAGOGASTRODUODENOSCOPY (EGD)  WITH PROPOFOL N/A 06/02/2015   Procedure: ESOPHAGOGASTRODUODENOSCOPY (EGD) WITH PROPOFOL withdialation;  Surgeon: Midge Miniumarren Wohl, MD;  [facility];  Service: Endoscopy;  Laterality: N/A;  . LUNG SURGERY Left    Upper lobe removed  . OOPHORECTOMY Left   . VAGINAL HYSTERECTOMY      MEDICATIONS: I have reviewed all medications and confirmed regimen as documented  Social History   Social History  . Marital status: Married    Spouse name: N/A  . Number of children: N/A  . Years of education: N/A   Occupational History  . Not on file.   Social History Main Topics  . Smoking status: Current Every Day Smoker    Packs/day: 1.00    Years: 30.00  . Smokeless tobacco: Never Used     Comment: quit some time in 2012  . Alcohol use No     Comment: rare consumption  . Drug use: No  . Sexual activity: Not on file   Other Topics Concern  . Not on file   Social History Narrative  . No narrative on file    Family History  Problem Relation Age of Onset  . COPD Mother   . Heart attack Mother   . Stroke Father   . COPD Father     ROS: No fever, myalgias/arthralgias, unexplained weight loss or weight gain No new focal weakness or sensory deficits No otalgia, hearing loss, visual changes, nasal and sinus symptoms, mouth and throat problems No neck pain or  adenopathy No abdominal pain, N/V/D, diarrhea, change in bowel pattern No dysuria, change in urinary pattern   Vitals:   05/09/16 1230 05/09/16 1330 05/09/16 1400 05/09/16 1430  BP: 116/81 116/79  105/72  Pulse: 95 90 94   Resp: 17 (!) 22 20 17   Temp:      TempSrc:      SpO2: 99% 100% 100%   Weight:      Height:         EXAM:  Gen: Appears older than true age, mild dyspena with speech  HEENT: NCAT, sclera white, oropharynx normal Neck: Supple without LAN, thyromegaly, JVD Lungs: breath sounds: very coarse wheezes throughout, percussion: normal Cardiovascular: Reg, no murmurs noted Abdomen: Soft,  nontender, normal BS Ext: without clubbing, cyanosis, edema Neuro: CNs grossly intact, motor and sensory intact Skin: Limited exam, no lesions noted  DATA:   BMP Latest Ref Rng & Units 05/09/2016 02/06/2016 02/05/2016  Glucose 65 - 99 mg/dL 98 161(W) 95  BUN 6 - 20 mg/dL 12 20 16   Creatinine 0.44 - 1.00 mg/dL 9.60 4.54 0.98  Sodium 135 - 145 mmol/L 143 139 142  Potassium 3.5 - 5.1 mmol/L 3.7 3.9 4.1  Chloride 101 - 111 mmol/L 109 105 109  CO2 22 - 32 mmol/L 24 27 26   Calcium 8.9 - 10.3 mg/dL 9.7 1.1(B) 8.9    CBC Latest Ref Rng & Units 05/09/2016 02/07/2016 02/06/2016  WBC 3.6 - 11.0 K/uL 12.2(H) 20.2(H) 13.6(H)  Hemoglobin 12.0 - 16.0 g/dL 14.7 11.9(L) 12.0  Hematocrit 35.0 - 47.0 % 43.4 36.7 36.6  Platelets 150 - 440 K/uL 274 275 273    CXR:  Hyperinflated, chronic changes, NACPD  CT chest: no PE, no acute infiltrates   IMPRESSION:   1) Acute on chronic respiratory failure 2) AECOPD 3) H/O BOOP - no evidence of infiltrates presently 4) recalcitrant smoker with little insight into relationship between this and her chronic pulmonary problems 5) Medical noncompliance 6) Severe PCN allergy    PLAN:  Admit to SDU PRN BiPAP Supplemental O2 Systemic steroids. Will cover with SSI while on high dose  Nebulized steroids and bronchodilators Empiric antibiotics - doxycycline Counseled re: smoking cessation    Billy Fischer, MD PCCM service Mobile [phone] Pager [phone] 05/09/2016

## 2016-05-09 NOTE — ED Notes (Signed)
Pt resting in bed, Tv turned on, pt tolerating bipap, pt updated on plan of care

## 2016-05-09 NOTE — Progress Notes (Signed)
RT attempted ABG prior to setup on Bipap, patient jerking arm during ABG attempt, unable to obtain ABG at this time.  Bipap started and Dr. Darnelle CatalanMalinda made aware that RT will attempt to draw ABG after patient calms down.

## 2016-05-09 NOTE — ED Notes (Signed)
Dr. Malinda notified of critical lactic acid.  

## 2016-05-09 NOTE — ED Notes (Signed)
Pt resting in bed, resp even and unlabored, pt tolerating bipap

## 2016-05-09 NOTE — ED Notes (Signed)
EDP at bedside  

## 2016-05-09 NOTE — Progress Notes (Signed)
eLink Physician-Brief Progress Note Patient Name: Caitlyn Evans M Frediani DOB: [birth year] MRN: [id number]   Date of Service  05/09/2016  HPI/Events of Note  Anxiety - Already on Xanax 0.25 mg PO TID.  eICU Interventions  Will order: 1. Xanax 0.25 mg PO X 1 as an extra dose.      Intervention Category Minor Interventions: Agitation / anxiety - evaluation and management  Lenell AntuSommer,Steven Eugene 05/09/2016, 10:38 PM

## 2016-05-09 NOTE — ED Notes (Signed)
RT at bedside for bipap placement.

## 2016-05-09 NOTE — ED Notes (Signed)
Pt anxious, states she "does not feel any better", EDP updated, pt updated on plan for CT scan, CT notified pt is currently on bipap

## 2016-05-09 NOTE — ED Notes (Signed)
Admitting MD at bedside.

## 2016-05-09 NOTE — ED Notes (Signed)
Pt taken off BIPAP, placed on 5L [place], pt tolerating well

## 2016-05-09 NOTE — Progress Notes (Signed)
Pt upset that alprazolam was not available to her when she requested it.  She was several hours away from her next prn dose.  Informed Dr Arsenio LoaderSommer of situation and Pt's request.  New orders received.  Will continue to monitor.

## 2016-05-10 LAB — GLUCOSE, CAPILLARY
Glucose-Capillary: 130 mg/dL — ABNORMAL HIGH (ref 65–99)
Glucose-Capillary: 123 mg/dL — ABNORMAL HIGH (ref 65–99)
Glucose-Capillary: 99 mg/dL (ref 65–99)
Glucose-Capillary: 127 mg/dL — ABNORMAL HIGH (ref 65–99)

## 2016-05-10 MED ORDER — PROMETHAZINE-CODEINE 6.25-10 MG/5ML PO SYRP: 5.0000 mL | ORAL_SOLUTION | Freq: Four times a day (QID) | ORAL | Status: DC | PRN

## 2016-05-10 MED ORDER — GUAIFENESIN ER 600 MG PO TB12
600.0000 mg | ORAL_TABLET | Freq: Two times a day (BID) | ORAL | Status: DC
  Administered 2016-05-10 – 2016-05-13 (×7): 600 mg via ORAL
  Filled 2016-05-10 (×7): qty 1

## 2016-05-10 MED ORDER — FAMOTIDINE 20 MG PO TABS
20.0000 mg | ORAL_TABLET | Freq: Two times a day (BID) | ORAL | Status: DC
  Administered 2016-05-10 – 2016-05-13 (×7): 20 mg via ORAL
  Filled 2016-05-10 (×7): qty 1

## 2016-05-10 MED ORDER — LORAZEPAM 2 MG/ML IJ SOLN
0.5000 mg | Freq: Four times a day (QID) | INTRAMUSCULAR | Status: DC | PRN
  Administered 2016-05-12 – 2016-05-13 (×2): 1 mg via INTRAVENOUS
  Filled 2016-05-10 (×2): qty 1

## 2016-05-10 MED ORDER — ORPHENADRINE CITRATE ER 100 MG PO TB12
100.0000 mg | ORAL_TABLET | Freq: Two times a day (BID) | ORAL | Status: DC
  Administered 2016-05-10 – 2016-05-11 (×3): 100 mg via ORAL
  Filled 2016-05-10 (×4): qty 1

## 2016-05-10 MED ORDER — ALPRAZOLAM 0.25 MG PO TABS
0.2500 mg | ORAL_TABLET | Freq: Three times a day (TID) | ORAL | Status: DC
  Administered 2016-05-10 – 2016-05-13 (×10): 0.25 mg via ORAL
  Filled 2016-05-10 (×9): qty 1

## 2016-05-10 MED ORDER — OXYCODONE-ACETAMINOPHEN 7.5-325 MG PO TABS
1.0000 | ORAL_TABLET | Freq: Two times a day (BID) | ORAL | Status: DC | PRN
  Administered 2016-05-10 – 2016-05-11 (×3): 1 via ORAL
  Filled 2016-05-10 (×3): qty 1

## 2016-05-10 NOTE — Progress Notes (Signed)
Order percocet for patient per Dr Nicholos Johnsamachandran.  Codeine is listed as an allergy, but patient states she is unsure "what type of codeine I'm allergic to, but I've been taking percocet for years.  I ran out of my medicine a month ago though and I couldn't get it because of money.  I think that's what brought all this on."

## 2016-05-10 NOTE — Progress Notes (Signed)
Tele d/c , (Dr. Winona LegatoVaickute telephone order)

## 2016-05-10 NOTE — Progress Notes (Addendum)
[facility]  Critical Care Medicine Progess Note    ASSESSMENT/PLAN   [age] F smoker followed as outpt with COPD with frequent exacerbations and hx of BOOP. Admitted with acute on chronic respiratory failure due to AECOPD  PULMONARY A:Acute exacerbation of COPD, with ongoing tobacco abuse. She appears to be doing better this morning, and is being weaned off BiPAP. -Noncompliance with medications, the patient has not been taking her Symbicort and theophylline as prescribed. -Severe anxiety/panic complicating her condition. -History of seen on biopsy in 2012 P:   -Wean off BiPAP, 2 nasal cannula. -Slow taper of steroids. -Continue duo nebs. -Discussed smoking cessation.  CARDIOVASCULAR A: History of myocardial infarction. P:  -We'll monitor on telemetry while in the ICU.  RENAL A: --  GASTROINTESTINAL A:  Will change Protonix to famotidine.  HEMATOLOGIC A:  --  INFECTIOUS A:  Acute bronchitis, continue doxycycline.  Micro/culture results:  BCx2 -- UC -- Sputum--  Antibiotics: Doxycycline 8/25>>  ENDOCRINE A:  Continue sliding scale insulin.  NEUROLOGIC/psych A:  Severe anxiety. P:   Scheduled Xanax,   MAJOR EVENTS/TEST RESULTS:   Best Practices  DVT Prophylaxis: Enoxaparin GI Prophylaxis: Famotidine   ---------------------------------------   ----------------------------------------   Name: Caitlyn Evans MRN: [id number] DOB: 05-16-1966    ADMISSION DATE:  05/09/2016     SUBJECTIVE:   Pt feels that her breathing has improved, she is wondering when she can get out of the ICU. She would like to take the BiPAP off.  Review of Systems:  Constitutional: Feels well. Cardiovascular: No chest pain.  Pulmonary: Denies chest pain The remainder of systems were reviewed and were found to be negative other than what is documented in the HPI.    VITAL SIGNS: Temp:  [97.8 F (36.6 C)-98.4 F (36.9 C)] 98.4 F (36.9 C) (08/24 1945) Pulse Rate:   [65-129] 81 (08/25 0740) Resp:  [15-32] 18 (08/25 0740) BP: (98-171)/(62-115) 109/62 (08/25 0740) SpO2:  [96 %-100 %] 98 % (08/25 0740) FiO2 (%):  [30 %] 30 % (08/25 0540) Weight:  [112 lb 3.4 oz (50.9 kg)-117 lb 11.6 oz (53.4 kg)] 117 lb 11.6 oz (53.4 kg) (08/25 0458) HEMODYNAMICS:   VENTILATOR SETTINGS: FiO2 (%):  [30 %] 30 % INTAKE / OUTPUT:  Intake/Output Summary (Last 24 hours) at 05/10/16 [id number] Last data filed at 05/09/16 1855  Gross per 24 hour  Intake              476 ml  Output              275 ml  Net              201 ml    PHYSICAL EXAMINATION: Physical Examination:   VS: BP 109/62   Pulse 81   Temp 98.4 F (36.9 C) (Oral)   Resp 18   Ht 4\' 11"  (1.499 m)   Wt 117 lb 11.6 oz (53.4 kg)   SpO2 98%   BMI 23.78 kg/m   General Appearance: No distress  Neuro:without focal findings, mental status normal. HEENT: PERRLA, EOM intact. Pulmonary: normal breath sounds   CardiovascularNormal S1,S2.  No m/r/g.   Abdomen: Benign, Soft, non-tender. Renal:  No costovertebral tenderness  GU:  Not performed at this time. Endocrine: No evident thyromegaly. Skin:   warm, no rashes, no ecchymosis  Extremities: normal, no cyanosis, clubbing.   LABS:   LABORATORY PANEL:   CBC  Recent Labs Lab 05/09/16 1026  WBC 12.2*  HGB 15.0  HCT 43.4  PLT 274    Chemistries   Recent Labs Lab 05/09/16 1026  NA 143  K 3.7  CL 109  CO2 24  GLUCOSE 98  BUN 12  CREATININE 0.67  CALCIUM 9.7  AST 26  ALT 18  ALKPHOS 79  BILITOT 0.5     Recent Labs Lab 05/09/16 1700 05/09/16 2203 05/10/16 0739  GLUCAP 138* 171* 130*   No results for input(s): PHART, PCO2ART, PO2ART in the last 168 hours.  Recent Labs Lab 05/09/16 1026  AST 26  ALT 18  ALKPHOS 79  BILITOT 0.5  ALBUMIN 4.4    Cardiac Enzymes  Recent Labs Lab 05/09/16 1026  TROPONINI <0.03    RADIOLOGY:  Ct Angio Chest Pe W And/or Wo Contrast  Result Date: 05/09/2016 CLINICAL DATA:  Shortness of  breath for 4 days EXAM: CT ANGIOGRAPHY CHEST WITH CONTRAST TECHNIQUE: Multidetector CT imaging of the chest was performed using the standard protocol during bolus administration of intravenous contrast. Multiplanar CT image reconstructions and MIPs were obtained to evaluate the vascular anatomy. CONTRAST:  60 mL Isovue 370 nonionic COMPARISON:  Chest CT Jan 31, 2016 and chest radiograph May 09, 2016 FINDINGS: Cardiovascular: There is no demonstrable pulmonary embolus. There is no thoracic aortic aneurysm or dissection. The visualized great vessels appear unremarkable with only minimal foci of calcification in the left subclavian artery. There are scattered foci of atherosclerotic calcification in the aorta. There are scattered foci of coronary artery calcification. The pericardium is not appreciably thickened. Mediastinum/Nodes: Thyroid appears unremarkable. There are scattered small mediastinal lymph nodes without adenopathy by size criteria. There is a small hiatal hernia. Lungs/Pleura: There is scarring in each lung apex, slightly more on the right than on the left. Postoperative change in the left upper lobe is stable. There are scattered areas of scarring elsewhere in both upper lobes as well as in the anterior left lung base region. In the area of left base scarring, there localize pleural thickening, stable. There is no appreciable edema or consolidation. There is a slight degree of upper and lower lobe bronchiectatic change bilaterally. There are no appreciable pleural effusions. Upper Abdomen: The visualized upper abdominal structures appear unremarkable. Musculoskeletal: There are no blastic or lytic bone lesions. Review of the MIP images confirms the above findings. IMPRESSION: There is no demonstrable pulmonary embolus. There are scattered foci of atherosclerotic calcification including scattered foci of coronary artery calcification. No thoracic aortic aneurysm or dissection evident. There are areas  of bronchiectatic change, relatively mild, bilaterally. There is scarring in both apices as well as in the anterior left base region. There is no edema or consolidation. Postoperative change left upper lobe, stable. No adenopathy.  Small hiatal hernia present. Electronically Signed   By: Bretta Bang III M.D.   On: 05/09/2016 13:22   Dg Chest Portable 1 View  Result Date: 05/09/2016 CLINICAL DATA:  Short of breath EXAM: PORTABLE CHEST 1 VIEW COMPARISON:  02/05/2016 FINDINGS: Heart size and vascularity normal. Negative for heart failure. Negative for pneumonia. Apical scarring bilaterally unchanged. No pleural effusion. IMPRESSION: Chronic lung disease with scarring in the apices. No acute abnormality. Electronically Signed   By: Marlan Palau M.D.   On: 05/09/2016 10:44       --Wells Guiles, MD.  ICU Pager: [phone] [facility] Office Number: [phone]  Santiago Glad, M.D.  Stephanie Acre, M.D.  Billy Fischer, M.D  05/10/2016   Critical Care Attestation.  I have personally obtained a history, examined the patient,  evaluated laboratory and imaging results, formulated the assessment and plan and placed orders. The Patient requires high complexity decision making for assessment and support, frequent evaluation and titration of therapies, application of advanced monitoring technologies and extensive interpretation of multiple databases. The patient has critical illness that could lead imminently to failure of 1 or more organ systems and requires the highest level of physician preparedness to intervene.  Critical Care Time devoted to patient care services described in this note is 35 minutes and is exclusive of time spent in procedures.

## 2016-05-10 NOTE — Progress Notes (Signed)
Pt transferred to 1A, room 149 by Clinical research associatewriter.  Report called to [employer].  Patient tolerating RA, but preferred O2 for comfort/anxiety.  Higher flow rates may be causing headache; rate turned down to 2L/min for trasfer.  O2 connected to wall at 2liters/min in room 149.  Patient resting quietly and comfortably in bed.  All belongings sent with her.  Ana informed patient in room.

## 2016-05-10 NOTE — Care Management (Signed)
Received patient from icu this afternoon.  Need for 02 is acute.  Discussed the need to assess for need of home oxygen with primary nurse and patient's exertional room air sats 94% .  Patient says she needs to discharge home no later than Sunday because she has to get her grand children to school on Monday.  Independent in all adls, denies issues accessing medical care, obtaining medications or with transportation.  Current with her PCP.  Says " I don't need anything but to go home."

## 2016-05-10 NOTE — Progress Notes (Addendum)
[facility] [place] at [facility]   PATIENT NAME: Caitlyn Evans    MR#:  [id number]  DATE OF BIRTH:  12-18-1965  SUBJECTIVE:  CHIEF COMPLAINT:   Chief Complaint  Patient presents with  . Shortness of Breath   Patient is a 50 year old Caucasian female with history of tobacco abuse, BOOP, emphysema, not on oxygen therapy at home, anxiety, lung mass, status post lobectomy, migraine headaches, coronary artery disease, who presents to the hospital with complaints of 50 history of shortness of breath, wheezing, dry cough. On arrival to the hospital patient was not hypoxic, but because of severe dyspnea, required BiPAP administration and was admitted to intensive care unit, now weaned off to 3 L of oxygen through nasal cannula, feels better, still admits of cough and severe headache around her head, continues to have wheezing and shortness of breath Review of Systems  Constitutional: Positive for malaise/fatigue. Negative for chills, fever and weight loss.  HENT: Negative for congestion.   Eyes: Negative for blurred vision and double vision.  Respiratory: Positive for cough, shortness of breath and wheezing. Negative for sputum production.   Cardiovascular: Positive for chest pain. Negative for palpitations, orthopnea, leg swelling and PND.  Gastrointestinal: Negative for abdominal pain, blood in stool, constipation, diarrhea, nausea and vomiting.  Genitourinary: Negative for dysuria, frequency, hematuria and urgency.  Musculoskeletal: Negative for falls.  Neurological: Positive for headaches. Negative for dizziness, tremors and focal weakness.  Endo/Heme/Allergies: Does not bruise/bleed easily.  Psychiatric/Behavioral: Negative for depression. The patient does not have insomnia.     VITAL SIGNS: Blood pressure 120/68, pulse 87, temperature 97.8 F (36.6 C), temperature source Axillary, resp. rate (!) 22, height 4\' 11"  (1.499 m), weight 53.4 kg (117 lb 11.6 oz), SpO2  96 %.  PHYSICAL EXAMINATION:   GENERAL:  50 y.o.-year-old patient lying in the bed in mild respiratory distress, remains dyspneic uncomfortable, especially whenever she moves around, or talks longer sentences.  EYES: Pupils equal, round, reactive to light and accommodation. No scleral icterus. Extraocular muscles intact.  HEENT: Head atraumatic, normocephalic. Oropharynx and nasopharynx clear.  NECK:  Supple, no jugular venous distention. No thyroid enlargement, no tenderness.  LUNGS: Markedly diminished breath sounds bilaterally, absent in upper left lobe, diffuse wheezing, few scattered rales,rhonchi , but no crepitations. Intermittent use of accessory muscles of respiration.  CARDIOVASCULAR: S1, S2 normal. No murmurs, rubs, or gallops.  ABDOMEN: Soft, nontender, nondistended. Bowel sounds present. No organomegaly or mass.  EXTREMITIES: No pedal edema, cyanosis, or clubbing.  NEUROLOGIC: Cranial nerves II through XII are intact. Muscle strength 5/5 in all extremities. Sensation intact. Gait not checked.  PSYCHIATRIC: The patient is alert and oriented x 3.  SKIN: No obvious rash, lesion, or ulcer.   ORDERS/RESULTS REVIEWED:   CBC  Recent Labs Lab 05/09/16 1026  WBC 12.2*  HGB 15.0  HCT 43.4  PLT 274  MCV 86.8  MCH 29.9  MCHC 34.5  RDW 17.5*  LYMPHSABS 1.4  MONOABS 0.8  EOSABS 0.0  BASOSABS 0.1   ------------------------------------------------------------------------------------------------------------------  Chemistries   Recent Labs Lab 05/09/16 1026  NA 143  K 3.7  CL 109  CO2 24  GLUCOSE 98  BUN 12  CREATININE 0.67  CALCIUM 9.7  AST 26  ALT 18  ALKPHOS 79  BILITOT 0.5   ------------------------------------------------------------------------------------------------------------------ estimated creatinine clearance is 61.4 mL/min (by C-G formula based on SCr of 0.8  mg/dL). ------------------------------------------------------------------------------------------------------------------ No results for input(s): TSH, T4TOTAL, T3FREE, THYROIDAB in the last 72 hours.  Invalid  input(s): FREET3  Cardiac Enzymes  Recent Labs Lab 05/09/16 1026  TROPONINI <0.03   ------------------------------------------------------------------------------------------------------------------ Invalid input(s): POCBNP ---------------------------------------------------------------------------------------------------------------  RADIOLOGY: Ct Angio Chest Pe W And/or Wo Contrast  Result Date: 05/09/2016 CLINICAL DATA:  Shortness of breath for 4 days EXAM: CT ANGIOGRAPHY CHEST WITH CONTRAST TECHNIQUE: Multidetector CT imaging of the chest was performed using the standard protocol during bolus administration of intravenous contrast. Multiplanar CT image reconstructions and MIPs were obtained to evaluate the vascular anatomy. CONTRAST:  60 mL Isovue 370 nonionic COMPARISON:  Chest CT Jan 31, 2016 and chest radiograph May 09, 2016 FINDINGS: Cardiovascular: There is no demonstrable pulmonary embolus. There is no thoracic aortic aneurysm or dissection. The visualized great vessels appear unremarkable with only minimal foci of calcification in the left subclavian artery. There are scattered foci of atherosclerotic calcification in the aorta. There are scattered foci of coronary artery calcification. The pericardium is not appreciably thickened. Mediastinum/Nodes: Thyroid appears unremarkable. There are scattered small mediastinal lymph nodes without adenopathy by size criteria. There is a small hiatal hernia. Lungs/Pleura: There is scarring in each lung apex, slightly more on the right than on the left. Postoperative change in the left upper lobe is stable. There are scattered areas of scarring elsewhere in both upper lobes as well as in the anterior left lung base region. In the area of  left base scarring, there localize pleural thickening, stable. There is no appreciable edema or consolidation. There is a slight degree of upper and lower lobe bronchiectatic change bilaterally. There are no appreciable pleural effusions. Upper Abdomen: The visualized upper abdominal structures appear unremarkable. Musculoskeletal: There are no blastic or lytic bone lesions. Review of the MIP images confirms the above findings. IMPRESSION: There is no demonstrable pulmonary embolus. There are scattered foci of atherosclerotic calcification including scattered foci of coronary artery calcification. No thoracic aortic aneurysm or dissection evident. There are areas of bronchiectatic change, relatively mild, bilaterally. There is scarring in both apices as well as in the anterior left base region. There is no edema or consolidation. Postoperative change left upper lobe, stable. No adenopathy.  Small hiatal hernia present. Electronically Signed   By: Bretta Bang III M.D.   On: 05/09/2016 13:22   Dg Chest Portable 1 View  Result Date: 05/09/2016 CLINICAL DATA:  Short of breath EXAM: PORTABLE CHEST 1 VIEW COMPARISON:  02/05/2016 FINDINGS: Heart size and vascularity normal. Negative for heart failure. Negative for pneumonia. Apical scarring bilaterally unchanged. No pleural effusion. IMPRESSION: Chronic lung disease with scarring in the apices. No acute abnormality. Electronically Signed   By: Marlan Palau M.D.   On: 05/09/2016 10:44    EKG:  Orders placed or performed during the hospital encounter of 05/09/16  . ED EKG within 10 minutes  . ED EKG within 10 minutes  EKG in the emergency room revealed sinus tachycardia at rate of 123 bpm, right atrial enlargement, and anterior infarct, old, minimal ST depressions diffusely, no acute ST-T changes CT angiogram revealed no pulmonary embolus, areas of bronchiectasis bilaterally was noted, as well as scarring in apices and anterior left base region. No edema  or consolidation.   ASSESSMENT AND PLAN:  Active Problems:   BOOP (bronchiolitis obliterans with organizing pneumonia) (HCC)   Acute exacerbation of chronic obstructive pulmonary disease (COPD) (HCC)   Acute on chronic respiratory failure (HCC)   Noncompliance with medication regimen #1. Acute respiratory failure with hypoxia, weaned off BiPAP, continue oxygen, wean off as tolerated #2. COPD exacerbation due to acute  bronchitis, continue doxycycline, steroids, Pulmicort, albuterol, DuoNeb nebs, get sputum cultures if possible, add codeine for cough suppression #3. Acute bronchitis, get sputum culture if possible, continue doxycycline #4. Headache, initiate patient on Norflex. Continue Percocet and codeine for cough suppression #5. Tobacco abuse. Counseling, discussed this patient for approximately 3 minutes, nicotine replacement therapy was refused  Management plans discussed with the patient, family and they are in agreement.   DRUG ALLERGIES:  Allergies  Allergen Reactions  . Penicillins Hives and Other (See Comments)    Has patient had a PCN reaction causing immediate rash, facial/tongue/throat swelling, SOB or lightheadedness with hypotension: No Has patient had a PCN reaction causing severe rash involving mucus membranes or skin necrosis: No Has patient had a PCN reaction that required hospitalization No Has patient had a PCN reaction occurring within the last 10 years: No If all of the above answers are "NO", then may proceed with Cephalosporin use.  . Codeine Hives and Nausea And Vomiting    CODE STATUS:     Code Status Orders        Start     Ordered   05/09/16 1436  Full code  Continuous     05/09/16 1440    Code Status History    Date Active Date Inactive Code Status Order ID Comments User Context   02/05/2016  7:14 PM 02/08/2016  2:52 PM Full Code [id number]  Ramonita LabAruna Gouru, MD Inpatient   12/05/2015 11:02 PM 12/09/2015  3:35 PM Full Code [id number]  Oralia Manisavid Willis, MD  Inpatient    Advance Directive Documentation   Flowsheet Row Most Recent Value  Type of Advance Directive  Living will  Pre-existing out of facility DNR order (yellow form or pink MOST form)  No data  "MOST" Form in Place?  No data      TOTAL TIME TAKING CARE OF THIS PATIENT: 35 minutes.    Katharina CaperVAICKUTE,Katelynne Revak M.D on 05/10/2016 at 11:08 AM  Between 7am to 6pm - Pager - [phone]  After 6pm go to [email or web address] - password [email or web address] [facility]  [facility]  Office  [phone]  CC: Primary care physician; Corky DownsMASOUD,JAVED, MD

## 2016-05-11 DIAGNOSIS — J9621 Acute and chronic respiratory failure with hypoxia: Secondary | ICD-10-CM

## 2016-05-11 DIAGNOSIS — J9622 Acute and chronic respiratory failure with hypercapnia: Secondary | ICD-10-CM

## 2016-05-11 LAB — BASIC METABOLIC PANEL
ANION GAP: 8 (ref 5–15)
BUN: 22 mg/dL — AB (ref 6–20)
CHLORIDE: 106 mmol/L (ref 101–111)
CO2: 27 mmol/L (ref 22–32)
Calcium: 9.2 mg/dL (ref 8.9–10.3)
Creatinine, Ser: 0.77 mg/dL (ref 0.44–1.00)
GFR calc Af Amer: >60 mL/min (ref >60)
GFR calc non Af Amer: >60 mL/min (ref >60)
Glucose, Bld: 144 mg/dL — ABNORMAL HIGH (ref 65–99)
POTASSIUM: 3.9 mmol/L (ref 3.5–5.1)
SODIUM: 141 mmol/L (ref 135–145)

## 2016-05-11 LAB — GLUCOSE, CAPILLARY
GLUCOSE-CAPILLARY: 111 mg/dL — AB (ref 65–99)
GLUCOSE-CAPILLARY: 110 mg/dL — AB (ref 65–99)
GLUCOSE-CAPILLARY: 157 mg/dL — AB (ref 65–99)
Glucose-Capillary: 158 mg/dL — ABNORMAL HIGH (ref 65–99)

## 2016-05-11 LAB — MAGNESIUM: Magnesium: 2.2 mg/dL (ref 1.7–2.4)

## 2016-05-11 LAB — CBC
HCT: 39.3 % (ref 35.0–47.0)
HEMOGLOBIN: 13.3 g/dL (ref 12.0–16.0)
MCH: 29.8 pg (ref 26.0–34.0)
MCHC: 33.8 g/dL (ref 32.0–36.0)
MCV: 88.3 fL (ref 80.0–100.0)
Platelets: 228 10*3/uL (ref 150–440)
RBC: 4.46 MIL/uL (ref 3.80–5.20)
RDW: 17.3 % — ABNORMAL HIGH (ref 11.5–14.5)
WBC: 16.9 10*3/uL — AB (ref 3.6–11.0)

## 2016-05-11 MED ORDER — PANTOPRAZOLE SODIUM 40 MG PO TBEC
40.0000 mg | DELAYED_RELEASE_TABLET | Freq: Every day | ORAL | Status: DC
Start: 2016-05-11 — End: 2016-05-13
  Administered 2016-05-11 – 2016-05-13 (×3): 40 mg via ORAL
  Filled 2016-05-11 (×3): qty 1

## 2016-05-11 MED ORDER — IPRATROPIUM-ALBUTEROL 0.5-2.5 (3) MG/3ML IN SOLN
3.0000 mL | RESPIRATORY_TRACT | Status: DC
  Administered 2016-05-11 – 2016-05-13 (×11): 3 mL via RESPIRATORY_TRACT
  Filled 2016-05-11 (×11): qty 3

## 2016-05-11 MED ORDER — PHENOL 1.4 % MT LIQD
1.0000 | OROMUCOSAL | Status: DC | PRN
  Administered 2016-05-11: 1 via OROMUCOSAL
  Filled 2016-05-11: qty 177

## 2016-05-11 MED ORDER — POLYETHYLENE GLYCOL 3350 17 G PO PACK
17.0000 g | PACK | Freq: Every day | ORAL | Status: DC | PRN
Start: 2016-05-11 — End: 2016-05-13
  Administered 2016-05-12 – 2016-05-13 (×2): 17 g via ORAL
  Filled 2016-05-11 (×2): qty 1

## 2016-05-11 MED ORDER — MOMETASONE FURO-FORMOTEROL FUM 200-5 MCG/ACT IN AERO
2.0000 | INHALATION_SPRAY | Freq: Two times a day (BID) | RESPIRATORY_TRACT | Status: DC
  Administered 2016-05-11 – 2016-05-13 (×4): 2 via RESPIRATORY_TRACT
  Filled 2016-05-11: qty 8.8

## 2016-05-11 MED ORDER — CYCLOBENZAPRINE HCL 10 MG PO TABS
10.0000 mg | ORAL_TABLET | Freq: Three times a day (TID) | ORAL | Status: DC | PRN
  Administered 2016-05-11: 10 mg via ORAL
  Filled 2016-05-11: qty 1

## 2016-05-11 MED ORDER — OXYCODONE-ACETAMINOPHEN 7.5-325 MG PO TABS
1.0000 | ORAL_TABLET | Freq: Three times a day (TID) | ORAL | Status: DC | PRN
  Administered 2016-05-11 – 2016-05-13 (×4): 1 via ORAL
  Filled 2016-05-11 (×4): qty 1

## 2016-05-11 MED ORDER — DOCUSATE SODIUM 100 MG PO CAPS
100.0000 mg | ORAL_CAPSULE | Freq: Two times a day (BID) | ORAL | Status: DC
  Administered 2016-05-11 – 2016-05-13 (×5): 100 mg via ORAL
  Filled 2016-05-11 (×5): qty 1

## 2016-05-11 MED ORDER — HYDROCOD POLST-CPM POLST ER 10-8 MG/5ML PO SUER
5.0000 mL | Freq: Two times a day (BID) | ORAL | Status: DC
  Administered 2016-05-11 – 2016-05-13 (×5): 5 mL via ORAL
  Filled 2016-05-11 (×5): qty 5

## 2016-05-11 NOTE — Progress Notes (Signed)
[facility] Medicine Progess Note    ASSESSMENT/PLAN   [age] F smoker followed as outpt with COPD with frequent exacerbations and hx of BOOP. Admitted with acute on chronic respiratory failure due to AECOPD  Acute exacerbation of COPD, with ongoing tobacco abuse. She appears to be doing better this morning, but continues to have wheezing, on RA. -Noncompliance with medications, the patient has not been taking her Symbicort and theophylline as prescribed. -Severe anxiety/panic complicating her condition. -History of organizing pneumonia seen on biopsy in 2012  -Weaned off BiPAP, now on RA.  -Slow taper of steroids. -Continue duo nebs. -Discussed smoking cessation. --Discussed the Importance of using her medications regularly.  Acute bronchitis. --continue doxycycline.  Severe anxiety. --Scheduled Xanax,  Nicotine abuse. -Discussed smoking cessation.  ---------------------------------------   ----------------------------------------   Name: Caitlyn Evans M Ly MRN: [id number] DOB: 1965/09/18    ADMISSION DATE:  05/09/2016     SUBJECTIVE:   She feels that her breathing is improved, but she continues to feel short winded, and does not feel ready to go home.  Review of Systems:  Constitutional: Feels well. Cardiovascular: No chest pain.  Pulmonary: Denies chest pain The remainder of systems were reviewed and were found to be negative other than what is documented in the HPI.    VITAL SIGNS: Temp:  [97.8 F (36.6 C)-98.8 F (37.1 C)] 98.4 F (36.9 C) (08/26 0715) Pulse Rate:  [71-104] 86 (08/26 0715) Resp:  [18-22] 22 (08/26 0715) BP: (113-148)/(66-75) 148/72 (08/26 0715) SpO2:  [93 %-100 %] 93 % (08/26 0715) HEMODYNAMICS:   VENTILATOR SETTINGS:   INTAKE / OUTPUT:  Intake/Output Summary (Last 24 hours) at 05/11/16 0924 Last data filed at 05/10/16 1300  Gross per 24 hour  Intake              240 ml  Output             1200 ml  Net              -960 ml    PHYSICAL EXAMINATION: Physical Examination:   VS: BP (!) 148/72 (BP Location: Right Arm)   Pulse 86 Comment: 86  Temp 98.4 F (36.9 C) (Oral)   Resp (!) 22 Comment: 22  Ht 4\' 11"  (1.499 m)   Wt 117 lb 11.6 oz (53.4 kg)   SpO2 93%   BMI 23.78 kg/m   General Appearance: No distress  Neuro:without focal findings, mental status normal. HEENT: PERRLA, EOM intact. Pulmonary: Bilateral diffuse wheezing. CardiovascularNormal S1,S2.  No m/r/g.   Abdomen: Benign, Soft, non-tender. Renal:  No costovertebral tenderness  GU:  Not performed at this time. Endocrine: No evident thyromegaly. Skin:   warm, no rashes, no ecchymosis  Extremities: normal, no cyanosis, clubbing.   LABS:   LABORATORY PANEL:   CBC  Recent Labs Lab 05/11/16 0429  WBC 16.9*  HGB 13.3  HCT 39.3  PLT 228    Chemistries   Recent Labs Lab 05/09/16 1026 05/11/16 0429  NA 143 141  K 3.7 3.9  CL 109 106  CO2 24 27  GLUCOSE 98 144*  BUN 12 22*  CREATININE 0.67 0.77  CALCIUM 9.7 9.2  MG  --  2.2  AST 26  --   ALT 18  --   ALKPHOS 79  --   BILITOT 0.5  --      Recent Labs Lab 05/09/16 2203 05/10/16 0739 05/10/16 1223 05/10/16 1706 05/10/16 2114 05/11/16 0721  GLUCAP 171* 130* 123* 99  127* 111*   No results for input(s): PHART, PCO2ART, PO2ART in the last 168 hours.  Recent Labs Lab 05/09/16 1026  AST 26  ALT 18  ALKPHOS 79  BILITOT 0.5  ALBUMIN 4.4    Cardiac Enzymes  Recent Labs Lab 05/09/16 1026  TROPONINI <0.03    RADIOLOGY:  Ct Angio Chest Pe W And/or Wo Contrast  Result Date: 05/09/2016 CLINICAL DATA:  Shortness of breath for 4 days EXAM: CT ANGIOGRAPHY CHEST WITH CONTRAST TECHNIQUE: Multidetector CT imaging of the chest was performed using the standard protocol during bolus administration of intravenous contrast. Multiplanar CT image reconstructions and MIPs were obtained to evaluate the vascular anatomy. CONTRAST:  60 mL Isovue 370 nonionic COMPARISON:   Chest CT Jan 31, 2016 and chest radiograph May 09, 2016 FINDINGS: Cardiovascular: There is no demonstrable pulmonary embolus. There is no thoracic aortic aneurysm or dissection. The visualized great vessels appear unremarkable with only minimal foci of calcification in the left subclavian artery. There are scattered foci of atherosclerotic calcification in the aorta. There are scattered foci of coronary artery calcification. The pericardium is not appreciably thickened. Mediastinum/Nodes: Thyroid appears unremarkable. There are scattered small mediastinal lymph nodes without adenopathy by size criteria. There is a small hiatal hernia. Lungs/Pleura: There is scarring in each lung apex, slightly more on the right than on the left. Postoperative change in the left upper lobe is stable. There are scattered areas of scarring elsewhere in both upper lobes as well as in the anterior left lung base region. In the area of left base scarring, there localize pleural thickening, stable. There is no appreciable edema or consolidation. There is a slight degree of upper and lower lobe bronchiectatic change bilaterally. There are no appreciable pleural effusions. Upper Abdomen: The visualized upper abdominal structures appear unremarkable. Musculoskeletal: There are no blastic or lytic bone lesions. Review of the MIP images confirms the above findings. IMPRESSION: There is no demonstrable pulmonary embolus. There are scattered foci of atherosclerotic calcification including scattered foci of coronary artery calcification. No thoracic aortic aneurysm or dissection evident. There are areas of bronchiectatic change, relatively mild, bilaterally. There is scarring in both apices as well as in the anterior left base region. There is no edema or consolidation. Postoperative change left upper lobe, stable. No adenopathy.  Small hiatal hernia present. Electronically Signed   By: Bretta Bang III M.D.   On: 05/09/2016 13:22   Dg  Chest Portable 1 View  Result Date: 05/09/2016 CLINICAL DATA:  Short of breath EXAM: PORTABLE CHEST 1 VIEW COMPARISON:  02/05/2016 FINDINGS: Heart size and vascularity normal. Negative for heart failure. Negative for pneumonia. Apical scarring bilaterally unchanged. No pleural effusion. IMPRESSION: Chronic lung disease with scarring in the apices. No acute abnormality. Electronically Signed   By: Marlan Palau M.D.   On: 05/09/2016 10:44       --Wells Guiles, MD.  ICU Pager: [phone] [facility] Office Number: [phone]  Santiago Glad, M.D.  Stephanie Acre, M.D.  Billy Fischer, M.D  05/11/2016

## 2016-05-11 NOTE — Progress Notes (Signed)
[facility] - [place] at [facility]   PATIENT NAME: Caitlyn Evans    MR#:  [id number]  DATE OF BIRTH:  1966/01/07  SUBJECTIVE:  CHIEF COMPLAINT:   Chief Complaint  Patient presents with  . Shortness of Breath   - H/o COPD and asthma, not on chronic home o2- admitted to ICU for Bipap- COPD exacerbation - now back on room air -continues to smoke at home, has been noncompliant with her medications - complains of cough, wheezing, back pain and constipation  REVIEW OF SYSTEMS:  Review of Systems  Constitutional: Positive for malaise/fatigue. Negative for chills and fever.  HENT: Negative for ear discharge, ear pain and nosebleeds.   Eyes: Negative for blurred vision and double vision.  Respiratory: Positive for cough, shortness of breath and wheezing.   Cardiovascular: Negative for chest pain, palpitations and leg swelling.  Gastrointestinal: Positive for constipation. Negative for abdominal pain, diarrhea, nausea and vomiting.  Genitourinary: Negative for dysuria.  Musculoskeletal: Positive for joint pain and myalgias.  Neurological: Negative for dizziness, sensory change, speech change, focal weakness, seizures and headaches.  Psychiatric/Behavioral: Negative for depression.    DRUG ALLERGIES:   Allergies  Allergen Reactions  . Penicillins Hives and Other (See Comments)    Has patient had a PCN reaction causing immediate rash, facial/tongue/throat swelling, SOB or lightheadedness with hypotension: No Has patient had a PCN reaction causing severe rash involving mucus membranes or skin necrosis: No Has patient had a PCN reaction that required hospitalization No Has patient had a PCN reaction occurring within the last 10 years: No If all of the above answers are "NO", then may proceed with Cephalosporin use.  . Codeine Hives and Nausea And Vomiting    VITALS:  Blood pressure (!) 148/72, pulse 86, temperature 98.4 F (36.9 C), temperature source Oral, resp.  rate (!) 22, height 4\' 11"  (1.499 m), weight 53.4 kg (117 lb 11.6 oz), SpO2 93 %.  PHYSICAL EXAMINATION:  Physical Exam  GENERAL:  50 y.o.-year-old patient sitting in the bed with no acute distress.  EYES: Pupils equal, round, reactive to light and accommodation. No scleral icterus. Extraocular muscles intact.  HEENT: Head atraumatic, normocephalic. Oropharynx and nasopharynx clear.  NECK:  Supple, no jugular venous distention. No thyroid enlargement, no tenderness.  LUNGS: Diffuse scattered wheezing all over the lung fields. No rales,rhonchi or crepitation. No use of accessory muscles of respiration.  CARDIOVASCULAR: S1, S2 normal. No murmurs, rubs, or gallops.  ABDOMEN: Soft, nontender, nondistended. Bowel sounds present. No organomegaly or mass.  EXTREMITIES: No pedal edema, cyanosis, or clubbing.  NEUROLOGIC: Cranial nerves II through XII are intact. Muscle strength 5/5 in all extremities. Sensation intact. Gait not checked.  PSYCHIATRIC: The patient is alert and oriented x 3.  SKIN: No obvious rash, lesion, or ulcer.    LABORATORY PANEL:   CBC  Recent Labs Lab 05/11/16 0429  WBC 16.9*  HGB 13.3  HCT 39.3  PLT 228   ------------------------------------------------------------------------------------------------------------------  Chemistries   Recent Labs Lab 05/09/16 1026 05/11/16 0429  NA 143 141  K 3.7 3.9  CL 109 106  CO2 24 27  GLUCOSE 98 144*  BUN 12 22*  CREATININE 0.67 0.77  CALCIUM 9.7 9.2  MG  --  2.2  AST 26  --   ALT 18  --   ALKPHOS 79  --   BILITOT 0.5  --    ------------------------------------------------------------------------------------------------------------------  Cardiac Enzymes  Recent Labs Lab 05/09/16 1026  TROPONINI <0.03   ------------------------------------------------------------------------------------------------------------------  RADIOLOGY:  Ct Angio Chest Pe W And/or Wo Contrast  Result Date:  05/09/2016 CLINICAL DATA:  Shortness of breath for 4 days EXAM: CT ANGIOGRAPHY CHEST WITH CONTRAST TECHNIQUE: Multidetector CT imaging of the chest was performed using the standard protocol during bolus administration of intravenous contrast. Multiplanar CT image reconstructions and MIPs were obtained to evaluate the vascular anatomy. CONTRAST:  60 mL Isovue 370 nonionic COMPARISON:  Chest CT Jan 31, 2016 and chest radiograph May 09, 2016 FINDINGS: Cardiovascular: There is no demonstrable pulmonary embolus. There is no thoracic aortic aneurysm or dissection. The visualized great vessels appear unremarkable with only minimal foci of calcification in the left subclavian artery. There are scattered foci of atherosclerotic calcification in the aorta. There are scattered foci of coronary artery calcification. The pericardium is not appreciably thickened. Mediastinum/Nodes: Thyroid appears unremarkable. There are scattered small mediastinal lymph nodes without adenopathy by size criteria. There is a small hiatal hernia. Lungs/Pleura: There is scarring in each lung apex, slightly more on the right than on the left. Postoperative change in the left upper lobe is stable. There are scattered areas of scarring elsewhere in both upper lobes as well as in the anterior left lung base region. In the area of left base scarring, there localize pleural thickening, stable. There is no appreciable edema or consolidation. There is a slight degree of upper and lower lobe bronchiectatic change bilaterally. There are no appreciable pleural effusions. Upper Abdomen: The visualized upper abdominal structures appear unremarkable. Musculoskeletal: There are no blastic or lytic bone lesions. Review of the MIP images confirms the above findings. IMPRESSION: There is no demonstrable pulmonary embolus. There are scattered foci of atherosclerotic calcification including scattered foci of coronary artery calcification. No thoracic aortic aneurysm  or dissection evident. There are areas of bronchiectatic change, relatively mild, bilaterally. There is scarring in both apices as well as in the anterior left base region. There is no edema or consolidation. Postoperative change left upper lobe, stable. No adenopathy.  Small hiatal hernia present. Electronically Signed   By: Bretta BangWilliam  Woodruff III M.D.   On: 05/09/2016 13:22    EKG:   Orders placed or performed during the hospital encounter of 05/09/16  . ED EKG within 10 minutes  . ED EKG within 10 minutes    ASSESSMENT AND PLAN:   50 y/o F with PMH of COPD, BOOP, Lung mass, CAD, ongoing smoking presents to the hospital secondary to worsening shortness of breath.  #1 acute on chronic respiratory failure-secondary to acute exacerbation of COPD. -Admitted to ICU for BiPAP, off BiPAP now, on room air. -Still has significant wheezing, continue high-dose steroids -Duonebs every 4 hours, adjusted inhalers -Appreciate pulmonary consult -CT of the chest reviewed. Continue doxycycline.  #2 hypertension-on Norvasc  #3 anxiety-on Ativan and Xanax  #4 DVT prophylaxis-on Lovenox   All the records are reviewed and case discussed with Care Management/Social Workerr. Management plans discussed with the patient, family and they are in agreement.  CODE STATUS: Full code  TOTAL TIME TAKING CARE OF THIS PATIENT: 37 minutes.   POSSIBLE D/C IN 2 DAYS, DEPENDING ON CLINICAL CONDITION.   Eligah Anello M.D on 05/11/2016 at 12:53 PM  Between 7am to 6pm - Pager - [phone]  After 6pm go to [email or web address] - Social research officer, governmentpassword EPAS [email or web address]  [facility] [place] [facility]  Office  [phone]  CC: Primary care physician; Corky DownsMASOUD,JAVED, MD

## 2016-05-12 LAB — GLUCOSE, CAPILLARY
GLUCOSE-CAPILLARY: 86 mg/dL (ref 65–99)
GLUCOSE-CAPILLARY: 97 mg/dL (ref 65–99)
GLUCOSE-CAPILLARY: 123 mg/dL — AB (ref 65–99)
Glucose-Capillary: 194 mg/dL — ABNORMAL HIGH (ref 65–99)

## 2016-05-12 NOTE — Progress Notes (Signed)
[employer] [facility] - [place] at [facility]   PATIENT NAME: Anastasha Ortez    MR#:  [id number]  DATE OF BIRTH:  07/30/1966  SUBJECTIVE:  CHIEF COMPLAINT:   Chief Complaint  Patient presents with  . Shortness of Breath   - H/o COPD and asthma, not on chronic home o2- admitted to ICU for Bipap- COPD exacerbation - now back on room air -continues to smoke at home, has been noncompliant with her medications - still wheezing, leaving the room to go outside. constipated  REVIEW OF SYSTEMS:  Review of Systems  Constitutional: Positive for malaise/fatigue. Negative for chills and fever.  HENT: Negative for ear discharge, ear pain and nosebleeds.   Eyes: Negative for blurred vision and double vision.  Respiratory: Positive for cough, shortness of breath and wheezing.   Cardiovascular: Negative for chest pain, palpitations and leg swelling.  Gastrointestinal: Positive for constipation. Negative for abdominal pain, diarrhea, nausea and vomiting.  Genitourinary: Negative for dysuria.  Musculoskeletal: Negative for joint pain and myalgias.  Neurological: Negative for dizziness, sensory change, speech change, focal weakness, seizures and headaches.  Psychiatric/Behavioral: Negative for depression.    DRUG ALLERGIES:   Allergies  Allergen Reactions  . Penicillins Hives and Other (See Comments)    Has patient had a PCN reaction causing immediate rash, facial/tongue/throat swelling, SOB or lightheadedness with hypotension: No Has patient had a PCN reaction causing severe rash involving mucus membranes or skin necrosis: No Has patient had a PCN reaction that required hospitalization No Has patient had a PCN reaction occurring within the last 10 years: No If all of the above answers are "NO", then may proceed with Cephalosporin use.  . Codeine Hives and Nausea And Vomiting    VITALS:  Blood pressure (!) 155/79, pulse 85, temperature 97.6 F (36.4 C), temperature source Oral,  resp. rate 19, height 4\' 11"  (1.499 m), weight 53.4 kg (117 lb 11.6 oz), SpO2 98 %.  PHYSICAL EXAMINATION:  Physical Exam  GENERAL:  50 y.o.-year-old patient sitting in the bed with no acute distress.  EYES: Pupils equal, round, reactive to light and accommodation. No scleral icterus. Extraocular muscles intact.  HEENT: Head atraumatic, normocephalic. Oropharynx and nasopharynx clear.  NECK:  Supple, no jugular venous distention. No thyroid enlargement, no tenderness.  LUNGS: Diffuse wheezing all over the lung fields. No rales,rhonchi or crepitation. No use of accessory muscles of respiration.  CARDIOVASCULAR: S1, S2 normal. No murmurs, rubs, or gallops.  ABDOMEN: Soft, nontender, nondistended. Bowel sounds present. No organomegaly or mass.  EXTREMITIES: No pedal edema, cyanosis, or clubbing.  NEUROLOGIC: Cranial nerves II through XII are intact. Muscle strength 5/5 in all extremities. Sensation intact. Gait not checked.  PSYCHIATRIC: The patient is alert and oriented x 3.  SKIN: No obvious rash, lesion, or ulcer.    LABORATORY PANEL:   CBC  Recent Labs Lab 05/11/16 0429  WBC 16.9*  HGB 13.3  HCT 39.3  PLT 228   ------------------------------------------------------------------------------------------------------------------  Chemistries   Recent Labs Lab 05/09/16 1026 05/11/16 0429  NA 143 141  K 3.7 3.9  CL 109 106  CO2 24 27  GLUCOSE 98 144*  BUN 12 22*  CREATININE 0.67 0.77  CALCIUM 9.7 9.2  MG  --  2.2  AST 26  --   ALT 18  --   ALKPHOS 79  --   BILITOT 0.5  --    ------------------------------------------------------------------------------------------------------------------  Cardiac Enzymes  Recent Labs Lab 05/09/16 1026  TROPONINI <0.03   ------------------------------------------------------------------------------------------------------------------  RADIOLOGY:  No results found.  EKG:   Orders placed or performed during the hospital  encounter of 05/09/16  . ED EKG within 10 minutes  . ED EKG within 10 minutes    ASSESSMENT AND PLAN:   50 y/o F with PMH of COPD, BOOP, Lung mass, CAD, ongoing smoking presents to the hospital secondary to worsening shortness of breath.  #1 acute on chronic respiratory failure-secondary to acute exacerbation of COPD. -Admitted to ICU for BiPAP, off BiPAP now, on room air. -Still has significant wheezing, continue high-dose steroids -Duonebs every 4 hours, adjusted inhalers - counseled about smoking. -Appreciate pulmonary consult -CT of the chest reviewed. Continue doxycycline.  #2 hypertension-on Norvasc  #3 anxiety-on Ativan and Xanax  #4 DVT prophylaxis-on Lovenox  Anticipate discharge tomorrow   All the records are reviewed and case discussed with Care Management/Social Workerr. Management plans discussed with the patient, family and they are in agreement.  CODE STATUS: Full code  TOTAL TIME TAKING CARE OF THIS PATIENT: 33 minutes.   POSSIBLE D/C TOMORROW, DEPENDING ON CLINICAL CONDITION.   Enid BaasKALISETTI,Chicquita Mendel M.D on 05/12/2016 at 9:54 AM  Between 7am to 6pm - Pager - [phone]  After 6pm go to [email or web address] - Social research officer, governmentpassword EPAS [email or web address]  [facility] [place] [facility]  Office  [phone]  CC: Primary care physician; Corky DownsMASOUD,JAVED, MD

## 2016-05-12 NOTE — Progress Notes (Signed)
[facility] Critical Care Medicine Progess Note    ASSESSMENT/PLAN   35 F smoker followed as outpt with COPD with frequent exacerbations and hx of BOOP. Admitted with acute on chronic respiratory failure due to AECOPD  A: Acute exacerbation of COPD, with ongoing tobacco abuse. She appears to be doing better this morning, wheezing improved, on RA. -Noncompliance with medications, the patient has not been taking her Symbicort and theophylline as prescribed. -Severe anxiety/panic complicating her condition. -History of organizing pneumonia seen on biopsy in 2012 P: -Weaned off BiPAP, now on RA.  -Slow taper of steroids. -Continue duo nebs. -Discussed smoking cessation. --Discussed the Importance of using her medications regularly. --Follow up outpt in 3-4 weeks.   Acute bronchitis. --continue doxycycline, favor short course of 5-7 days.   Severe anxiety. --Scheduled Xanax  Nicotine abuse. -Discussed smoking cessation.  Pulmonary service will sign off for now, please call if there any questions or concerns.   ---------------------------------------   ----------------------------------------   Name: Caitlyn Evans MRN: [id number] DOB: [birth year]    ADMISSION DATE:  05/09/2016     SUBJECTIVE:   She feels that her breathing is improved from yesterday.  Review of Systems:  Constitutional: Feels well. Cardiovascular: No chest pain.  Pulmonary: Denies chest pain The remainder of systems were reviewed and were found to be negative other than what is documented in the HPI.    VITAL SIGNS: Temp:  [97.6 F (36.4 C)-98.9 F (37.2 C)] 97.6 F (36.4 C) (08/27 0759) Pulse Rate:  [80-101] 85 (08/27 0759) Resp:  [18-20] 19 (08/27 0759) BP: (112-155)/(65-79) 155/79 (08/27 0759) SpO2:  [94 %-98 %] 98 % (08/27 0759) HEMODYNAMICS:   VENTILATOR SETTINGS:   INTAKE / OUTPUT:  Intake/Output Summary (Last 24 hours) at 05/12/16 1037 Last data filed at 05/11/16 2059  Gross per 24 hour  Intake              123 ml  Output                0 ml  Net              123 ml    PHYSICAL EXAMINATION: Physical Examination:   VS: BP (!) 155/79 (BP Location: Left Arm) Comment: pt just ambulated  Pulse 85   Temp 97.6 F (36.4 C) (Oral)   Resp 19   Ht 4\' 11"  (1.499 m)   Wt 117 lb 11.6 oz (53.4 kg)   SpO2 98%   BMI 23.78 kg/m   General Appearance: No distress  Neuro:without focal findings, mental status normal. HEENT: PERRLA, EOM intact. Pulmonary: Bilateral scattered wheezing --much better than yesterday. CardiovascularNormal S1,S2.  No m/r/g.   Abdomen: Benign, Soft, non-tender. Renal:  No costovertebral tenderness  GU:  Not performed at this time. Endocrine: No evident thyromegaly. Skin:   warm, no rashes, no ecchymosis  Extremities: normal, no cyanosis, clubbing.   LABS:   LABORATORY PANEL:   CBC  Recent Labs Lab 05/11/16 0429  WBC 16.9*  HGB 13.3  HCT 39.3  PLT 228    Chemistries   Recent Labs Lab 05/09/16 1026 05/11/16 0429  NA 143 141  K 3.7 3.9  CL 109 106  CO2 24 27  GLUCOSE 98 144*  BUN 12 22*  CREATININE 0.67 0.77  CALCIUM 9.7 9.2  MG  --  2.2  AST 26  --   ALT 18  --   ALKPHOS 79  --   BILITOT 0.5  --  Recent Labs Lab 05/10/16 2114 05/11/16 0721 05/11/16 1121 05/11/16 1652 05/11/16 2143 05/12/16 0821  GLUCAP 127* 111* 110* 157* 158* 86   No results for input(s): PHART, PCO2ART, PO2ART in the last 168 hours.  Recent Labs Lab 05/09/16 1026  AST 26  ALT 18  ALKPHOS 79  BILITOT 0.5  ALBUMIN 4.4    Cardiac Enzymes  Recent Labs Lab 05/09/16 1026  TROPONINI <0.03    RADIOLOGY:  No results found.     Wells Guiles--Deep Azile Minardi, MD.  ICU Pager: [phone] [facility] Office Number: [phone]  Santiago Gladavid Kasa, M.D.  Stephanie AcreVishal Mungal, M.D.  Billy Fischeravid Simonds, M.D  05/12/2016

## 2016-05-13 LAB — GLUCOSE, CAPILLARY: Glucose-Capillary: 125 mg/dL — ABNORMAL HIGH (ref 65–99)

## 2016-05-13 MED ORDER — THEOPHYLLINE ER 200 MG PO TB12: 200.0000 mg | ORAL_TABLET | Freq: Two times a day (BID) | ORAL | 2 refills | Status: DC

## 2016-05-13 MED ORDER — DOXYCYCLINE HYCLATE 100 MG PO TABS: 100.0000 mg | ORAL_TABLET | Freq: Two times a day (BID) | ORAL | 0 refills | Status: DC

## 2016-05-13 MED ORDER — OXYCODONE-ACETAMINOPHEN 7.5-325 MG PO TABS: 1.0000 | ORAL_TABLET | Freq: Three times a day (TID) | ORAL | 0 refills | Status: DC | PRN

## 2016-05-13 MED ORDER — BENZONATATE 100 MG PO CAPS: 200.0000 mg | ORAL_CAPSULE | Freq: Three times a day (TID) | ORAL | 0 refills | Status: DC

## 2016-05-13 MED ORDER — IPRATROPIUM-ALBUTEROL 0.5-2.5 (3) MG/3ML IN SOLN: 3.0000 mL | RESPIRATORY_TRACT | 2 refills | Status: AC | PRN

## 2016-05-13 MED ORDER — MOMETASONE FURO-FORMOTEROL FUM 200-5 MCG/ACT IN AERO: 2.0000 | INHALATION_SPRAY | Freq: Two times a day (BID) | RESPIRATORY_TRACT | 11 refills | Status: DC

## 2016-05-13 MED ORDER — GUAIFENESIN ER 600 MG PO TB12: 600.0000 mg | ORAL_TABLET | Freq: Two times a day (BID) | ORAL | 0 refills | Status: DC

## 2016-05-13 MED ORDER — AMLODIPINE BESYLATE 5 MG PO TABS: 5.0000 mg | ORAL_TABLET | Freq: Every day | ORAL | 2 refills | Status: DC

## 2016-05-13 MED ORDER — LORAZEPAM 1 MG PO TABS: 1.0000 mg | ORAL_TABLET | Freq: Three times a day (TID) | ORAL | 0 refills | Status: DC

## 2016-05-13 MED ORDER — PANTOPRAZOLE SODIUM 40 MG PO TBEC: 40.0000 mg | DELAYED_RELEASE_TABLET | Freq: Every day | ORAL | 2 refills | Status: DC

## 2016-05-13 MED ORDER — HYDROCOD POLST-CPM POLST ER 10-8 MG/5ML PO SUER: 5.0000 mL | Freq: Two times a day (BID) | ORAL | 0 refills | Status: DC

## 2016-05-13 MED ORDER — PREDNISONE 10 MG (21) PO TBPK: 10.0000 mg | ORAL_TABLET | Freq: Every day | ORAL | 0 refills | Status: DC

## 2016-05-13 NOTE — Discharge Summary (Signed)
[facility] - [place] [facility]   PATIENT NAME: Caitlyn Evans    MR#:  [id number]  DATE OF BIRTH:  1965-10-19  DATE OF ADMISSION:  05/09/2016   ADMITTING PHYSICIAN: Merwyn Katos, MD  DATE OF DISCHARGE: 05/13/2016 11:32 AM  PRIMARY CARE PHYSICIAN: MASOUD,JAVED, MD   ADMISSION DIAGNOSIS:   Dyspnea [R06.00] COPD exacerbation (HCC) [J44.1]  DISCHARGE DIAGNOSIS:   Active Problems:   BOOP (bronchiolitis obliterans with organizing pneumonia) (HCC)   Acute exacerbation of chronic obstructive pulmonary disease (COPD) (HCC)   Acute on chronic respiratory failure (HCC)   Noncompliance with medication regimen   SECONDARY DIAGNOSIS:   Past Medical History:  Diagnosis Date  . Anxiety    panic attacks  . BOOP (bronchiolitis obliterans with organizing pneumonia) (HCC)   . Dysphagia, pharyngoesophageal phase 05/30/2015  . Emphysema lung (HCC) 05/30/2015  . Hypercholesterolemia   . Lung mass   . Migraines   . Motion sickness    all moving vehicles  . Myocardial infarction (HCC) 2012  . Seasonal allergies   . Shortness of breath dyspnea    1 flight-stairs    HOSPITAL COURSE:   50 y/o F with PMH of COPD, BOOP, Lung mass, CAD, ongoing smoking presents to the hospital secondary to worsening shortness of breath.  #1 acute on chronic respiratory failure-secondary to acute exacerbation of COPD. -Admitted to ICU for BiPAP, was on room air at discharge. -Still has significant wheezing, his baseline wheezing. Discharge on 2 week prednisone taper. -As been noncompliant with her inhalers and medications as outpatient. Strongly counseled. Discharged on inhaler and nebulizer - counseled about smoking. -Appreciate pulmonary consult -CT of the chest reviewed. Continue doxycycline.  #2 hypertension-on Norvasc  #3 anxiety-on Ativan  #4 smoking-strongly counseled against smoking. Patient has been walking off the floor even while in the hospital.   Discharge  today  DISCHARGE CONDITIONS:   Guarded CONSULTS OBTAINED:  Pulmonary consultation by Dr.Ram   DRUG ALLERGIES:   Allergies  Allergen Reactions  . Penicillins Hives and Other (See Comments)    Has patient had a PCN reaction causing immediate rash, facial/tongue/throat swelling, SOB or lightheadedness with hypotension: No Has patient had a PCN reaction causing severe rash involving mucus membranes or skin necrosis: No Has patient had a PCN reaction that required hospitalization No Has patient had a PCN reaction occurring within the last 10 years: No If all of the above answers are "NO", then may proceed with Cephalosporin use.  . Codeine Hives and Nausea And Vomiting   DISCHARGE MEDICATIONS:     Medication List    STOP taking these medications   ALPRAZolam 0.25 MG tablet Commonly known as:  XANAX   budesonide-formoterol 160-4.5 MCG/ACT inhaler Commonly known as:  SYMBICORT   Doxycycline Hyclate 200 MG Tbec Replaced by:  doxycycline 100 MG tablet   predniSONE 10 MG tablet Commonly known as:  DELTASONE Replaced by:  predniSONE 10 MG (21) Tbpk tablet     TAKE these medications   amLODipine 5 MG tablet Commonly known as:  NORVASC Take 1 tablet (5 mg total) by mouth daily. What changed:  how much to take  how to take this  when to take this   benzonatate 100 MG capsule Commonly known as:  TESSALON PERLES Take 2 capsules (200 mg total) by mouth 3 (three) times daily.   chlorpheniramine-HYDROcodone 10-8 MG/5ML Suer Commonly known as:  TUSSIONEX Take 5 mLs by mouth every 12 (twelve) hours.   doxycycline 100 MG  tablet Commonly known as:  VIBRA-TABS Take 1 tablet (100 mg total) by mouth 2 (two) times daily. X 5 more days Replaces:  Doxycycline Hyclate 200 MG Tbec   guaiFENesin 600 MG 12 hr tablet Commonly known as:  MUCINEX Take 1 tablet (600 mg total) by mouth 2 (two) times daily.   ipratropium-albuterol 0.5-2.5 (3) MG/3ML Soln Commonly known as:   DUONEB Inhale 3 mLs into the lungs every 4 (four) hours as needed (for wheezing/shortness of breath).   LORazepam 1 MG tablet Commonly known as:  ATIVAN Take 1 tablet (1 mg total) by mouth every 8 (eight) hours. What changed:  Another medication with the same name was removed. Continue taking this medication, and follow the directions you see here.   mometasone-formoterol 200-5 MCG/ACT Aero Commonly known as:  DULERA Inhale 2 puffs into the lungs 2 (two) times daily.   MULTIVITAMIN GUMMIES ADULT Chew Chew 1 each by mouth daily.   oxyCODONE-acetaminophen 7.5-325 MG tablet Commonly known as:  PERCOCET Take 1 tablet by mouth every 8 (eight) hours as needed for moderate pain or severe pain.   pantoprazole 40 MG tablet Commonly known as:  PROTONIX Take 1 tablet (40 mg total) by mouth daily. What changed:  medication strength  how much to take  Another medication with the same name was removed. Continue taking this medication, and follow the directions you see here.   predniSONE 10 MG (21) Tbpk tablet Commonly known as:  STERAPRED UNI-PAK 21 TAB Take 1 tablet (10 mg total) by mouth daily. 6 tabs PO x 2 days 5 tabs PO x 2 days 4 tabs PO x 2 days 3 tabs PO x 2 days 2 tabs PO x 2 days 1 tab PO x 2 days and stop Replaces:  predniSONE 10 MG tablet   theophylline 200 MG 12 hr tablet Commonly known as:  THEODUR Take 1 tablet (200 mg total) by mouth 2 (two) times daily.        DISCHARGE INSTRUCTIONS:   1. Smoking cessation recommended 2. Pulmonary follow commands 2 weeks 3. PCP follow-up in 2 weeks  DIET:   Cardiac diet  ACTIVITY:   Activity as tolerated  OXYGEN:   Home Oxygen: No.  Oxygen Delivery: room air  DISCHARGE LOCATION:   home   If you experience worsening of your admission symptoms, develop shortness of breath, life threatening emergency, suicidal or homicidal thoughts you must seek medical attention immediately by calling 911 or calling your MD  immediately  if symptoms less severe.  You Must read complete instructions/literature along with all the possible adverse reactions/side effects for all the Medicines you take and that have been prescribed to you. Take any new Medicines after you have completely understood and accpet all the possible adverse reactions/side effects.   Please note  You were cared for by a hospitalist during your hospital stay. If you have any questions about your discharge medications or the care you received while you were in the hospital after you are discharged, you can call the unit and asked to speak with the hospitalist on call if the hospitalist that took care of you is not available. Once you are discharged, your primary care physician will handle any further medical issues. Please note that NO REFILLS for any discharge medications will be authorized once you are discharged, as it is imperative that you return to your primary care physician (or establish a relationship with a primary care physician if you do not have one) for your  aftercare needs so that they can reassess your need for medications and monitor your lab values.    On the day of Discharge:  VITAL SIGNS:   Blood pressure (!) 141/79, pulse (!) 102, temperature 98.4 F (36.9 C), temperature source Oral, resp. rate (!) 2, height 4\' 11"  (1.499 m), weight 53.5 kg (118 lb), SpO2 96 %.  PHYSICAL EXAMINATION:    GENERAL:  50 y.o.-year-old patient sitting in the bed with no acute distress.  EYES: Pupils equal, round, reactive to light and accommodation. No scleral icterus. Extraocular muscles intact.  HEENT: Head atraumatic, normocephalic. Oropharynx and nasopharynx clear.  NECK:  Supple, no jugular venous distention. No thyroid enlargement, no tenderness.  LUNGS: Diffuse, only expiratory wheezing all over the lung fields. No rales,rhonchi or crepitation. No use of accessory muscles of respiration.  CARDIOVASCULAR: S1, S2 normal. No murmurs, rubs,  or gallops.  ABDOMEN: Soft, nontender, nondistended. Bowel sounds present. No organomegaly or mass.  EXTREMITIES: No pedal edema, cyanosis, or clubbing.  NEUROLOGIC: Cranial nerves II through XII are intact. Muscle strength 5/5 in all extremities. Sensation intact. Gait not checked.  PSYCHIATRIC: The patient is alert and oriented x 3.  SKIN: No obvious rash, lesion, or ulcer.   DATA REVIEW:   CBC  Recent Labs Lab 05/11/16 0429  WBC 16.9*  HGB 13.3  HCT 39.3  PLT 228    Chemistries   Recent Labs Lab 05/09/16 1026 05/11/16 0429  NA 143 141  K 3.7 3.9  CL 109 106  CO2 24 27  GLUCOSE 98 144*  BUN 12 22*  CREATININE 0.67 0.77  CALCIUM 9.7 9.2  MG  --  2.2  AST 26  --   ALT 18  --   ALKPHOS 79  --   BILITOT 0.5  --      Microbiology Results  Results for orders placed or performed during the hospital encounter of 05/09/16  MRSA PCR Screening     Status: None   Collection Time: 05/09/16  5:43 PM  Result Value Ref Range Status   MRSA by PCR NEGATIVE NEGATIVE Final    Comment:        The GeneXpert MRSA Assay (FDA approved for NASAL specimens only), is one component of a comprehensive MRSA colonization surveillance program. It is not intended to diagnose MRSA infection nor to guide or monitor treatment for MRSA infections.     RADIOLOGY:  No results found.   Management plans discussed with the patient, family and they are in agreement.  CODE STATUS:  Code Status History    Date Active Date Inactive Code Status Order ID Comments User Context   05/09/2016  2:40 PM 05/13/2016  2:37 PM Full Code [id number]  Merwyn Katosavid B Simonds, MD ED   02/05/2016  7:14 PM 02/08/2016  2:52 PM Full Code [id number]  Ramonita LabAruna Gouru, MD Inpatient   12/05/2015 11:02 PM 12/09/2015  3:35 PM Full Code [id number]  Oralia Manisavid Willis, MD Inpatient    Advance Directive Documentation   Flowsheet Row Most Recent Value  Type of Advance Directive  Living will  Pre-existing out of facility DNR order (yellow  form or pink MOST form)  No data  "MOST" Form in Place?  No data      TOTAL TIME TAKING CARE OF THIS PATIENT: 38 minutes.    Torrance Frech M.D on 05/13/2016 at 3:06 PM  Between 7am to 6pm - Pager - [phone]  After 6pm go to [email or web address] - Therapist, nutritionalpassword [email or web address]  [facility] [place] [facility]  Office  [phone]  CC: Primary care physician; Corky Downs, MD   Note: This dictation was prepared with Dragon dictation along with smaller phrase technology. Any transcriptional errors that result from this process are unintentional.

## 2016-05-13 NOTE — Progress Notes (Signed)
Caitlyn Evans to be D/C'd Home per MD order.  Discussed with the patient and all questions fully answered.  VSS, Skin clean, dry and intact without evidence of skin break down, no evidence of skin tears noted. IV catheter discontinued intact. Site without signs and symptoms of complications. Dressing and pressure applied.  An After Visit Summary was printed and given to the patient. Patient received prescription.  D/c education completed with patient/family including follow up instructions, medication list, d/c activities limitations if indicated, with other d/c instructions as indicated by MD - patient able to verbalize understanding, all questions fully answered.   Patient instructed to return to ED, call 911, or call MD for any changes in condition.   Patient escorted via ambulation, and D/C home via private auto.  Harvie HeckMelanie Ahmoni Edge 05/13/2016 11:28 AM

## 2016-05-13 NOTE — Care Management Important Message (Signed)
Important Message  Patient Details  Name: Caitlyn Evans MRN: [id number] Date of Birth: [birth year]   Medicare Important Message Given:  Yes    Adonis HugueninBerkhead, Khara Renaud L, RN 05/13/2016, 10:46 AM

## 2016-05-21 DIAGNOSIS — I43 Cardiomyopathy in diseases classified elsewhere: Secondary | ICD-10-CM | POA: Diagnosis not present

## 2016-05-21 DIAGNOSIS — G43919 Migraine, unspecified, intractable, without status migrainosus: Secondary | ICD-10-CM | POA: Diagnosis not present

## 2016-05-21 DIAGNOSIS — K222 Esophageal obstruction: Secondary | ICD-10-CM | POA: Diagnosis not present

## 2016-05-21 DIAGNOSIS — I119 Hypertensive heart disease without heart failure: Secondary | ICD-10-CM | POA: Diagnosis not present

## 2016-06-19 DIAGNOSIS — R042 Hemoptysis: Secondary | ICD-10-CM | POA: Diagnosis not present

## 2016-06-19 DIAGNOSIS — J439 Emphysema, unspecified: Secondary | ICD-10-CM | POA: Diagnosis not present

## 2016-06-19 DIAGNOSIS — Z23 Encounter for immunization: Secondary | ICD-10-CM | POA: Diagnosis not present

## 2016-06-19 DIAGNOSIS — J449 Chronic obstructive pulmonary disease, unspecified: Secondary | ICD-10-CM | POA: Diagnosis not present

## 2016-07-16 ENCOUNTER — Encounter: Payer: Self-pay | Admitting: Emergency Medicine

## 2016-07-16 ENCOUNTER — Emergency Department
Admission: EM | Admit: 2016-07-16 | Discharge: 2016-07-16 | Disposition: A | Discharge status: Home / Self Care | Payer: Medicare Other | Source: Home / Self Care | Attending: Emergency Medicine | Admitting: Emergency Medicine

## 2016-07-16 ENCOUNTER — Emergency Department: Payer: Medicare Other

## 2016-07-16 DIAGNOSIS — J45909 Unspecified asthma, uncomplicated: Secondary | ICD-10-CM

## 2016-07-16 DIAGNOSIS — J441 Chronic obstructive pulmonary disease with (acute) exacerbation: Secondary | ICD-10-CM | POA: Insufficient documentation

## 2016-07-16 DIAGNOSIS — E7800 Pure hypercholesterolemia, unspecified: Secondary | ICD-10-CM | POA: Diagnosis not present

## 2016-07-16 DIAGNOSIS — I509 Heart failure, unspecified: Secondary | ICD-10-CM | POA: Insufficient documentation

## 2016-07-16 DIAGNOSIS — J9601 Acute respiratory failure with hypoxia: Secondary | ICD-10-CM | POA: Diagnosis not present

## 2016-07-16 DIAGNOSIS — Z8249 Family history of ischemic heart disease and other diseases of the circulatory system: Secondary | ICD-10-CM | POA: Diagnosis not present

## 2016-07-16 DIAGNOSIS — Z880 Allergy status to penicillin: Secondary | ICD-10-CM | POA: Diagnosis not present

## 2016-07-16 DIAGNOSIS — R0602 Shortness of breath: Secondary | ICD-10-CM | POA: Diagnosis not present

## 2016-07-16 DIAGNOSIS — Z885 Allergy status to narcotic agent status: Secondary | ICD-10-CM | POA: Diagnosis not present

## 2016-07-16 DIAGNOSIS — Z7952 Long term (current) use of systemic steroids: Secondary | ICD-10-CM | POA: Diagnosis not present

## 2016-07-16 DIAGNOSIS — I110 Hypertensive heart disease with heart failure: Secondary | ICD-10-CM | POA: Insufficient documentation

## 2016-07-16 DIAGNOSIS — R079 Chest pain, unspecified: Secondary | ICD-10-CM | POA: Diagnosis not present

## 2016-07-16 DIAGNOSIS — R1314 Dysphagia, pharyngoesophageal phase: Secondary | ICD-10-CM | POA: Diagnosis not present

## 2016-07-16 DIAGNOSIS — J8489 Other specified interstitial pulmonary diseases: Secondary | ICD-10-CM | POA: Diagnosis not present

## 2016-07-16 DIAGNOSIS — F17200 Nicotine dependence, unspecified, uncomplicated: Secondary | ICD-10-CM | POA: Insufficient documentation

## 2016-07-16 DIAGNOSIS — Z79899 Other long term (current) drug therapy: Secondary | ICD-10-CM

## 2016-07-16 DIAGNOSIS — I252 Old myocardial infarction: Secondary | ICD-10-CM | POA: Diagnosis not present

## 2016-07-16 DIAGNOSIS — J209 Acute bronchitis, unspecified: Secondary | ICD-10-CM | POA: Diagnosis not present

## 2016-07-16 DIAGNOSIS — K219 Gastro-esophageal reflux disease without esophagitis: Secondary | ICD-10-CM | POA: Diagnosis not present

## 2016-07-16 HISTORY — DX: Essential (primary) hypertension: I10

## 2016-07-16 HISTORY — DX: Heart failure, unspecified: I50.9

## 2016-07-16 HISTORY — DX: Unspecified asthma, uncomplicated: J45.909

## 2016-07-16 LAB — BASIC METABOLIC PANEL
Anion gap: 8 (ref 5–15)
BUN: 21 mg/dL — AB (ref 6–20)
CHLORIDE: 107 mmol/L (ref 101–111)
CO2: 25 mmol/L (ref 22–32)
CREATININE: 0.75 mg/dL (ref 0.44–1.00)
Calcium: 9.2 mg/dL (ref 8.9–10.3)
GFR calc Af Amer: >60 mL/min (ref >60)
GFR calc non Af Amer: >60 mL/min (ref >60)
GLUCOSE: 93 mg/dL (ref 65–99)
Potassium: 4 mmol/L (ref 3.5–5.1)
SODIUM: 140 mmol/L (ref 135–145)

## 2016-07-16 LAB — THEOPHYLLINE LEVEL

## 2016-07-16 LAB — BLOOD GAS, ARTERIAL
Acid-base deficit: 1.7 mmol/L (ref 0.0–2.0)
Bicarbonate: 22.1 mmol/L (ref 20.0–28.0)
FIO2: 0.21
O2 Saturation: 96.1 %
PH ART: 7.42 (ref 7.350–7.450)
Patient temperature: 37
pCO2 arterial: 34 mmHg (ref 32.0–48.0)
pO2, Arterial: 81 mmHg — ABNORMAL LOW (ref 83.0–108.0)

## 2016-07-16 LAB — CBC
HEMATOCRIT: 45.1 % (ref 35.0–47.0)
Hemoglobin: 14.8 g/dL (ref 12.0–16.0)
MCH: 29.5 pg (ref 26.0–34.0)
MCHC: 32.9 g/dL (ref 32.0–36.0)
MCV: 89.7 fL (ref 80.0–100.0)
PLATELETS: 227 10*3/uL (ref 150–440)
RBC: 5.02 MIL/uL (ref 3.80–5.20)
RDW: 16.5 % — AB (ref 11.5–14.5)
WBC: 9.2 10*3/uL (ref 3.6–11.0)

## 2016-07-16 LAB — HEPATIC FUNCTION PANEL
ALBUMIN: 4 g/dL (ref 3.5–5.0)
ALK PHOS: 61 U/L (ref 38–126)
ALT: 11 U/L — ABNORMAL LOW (ref 14–54)
AST: 20 U/L (ref 15–41)
Bilirubin, Direct: <0.1 mg/dL — ABNORMAL LOW (ref 0.1–0.5)
TOTAL PROTEIN: 7.1 g/dL (ref 6.5–8.1)
Total Bilirubin: 0.6 mg/dL (ref 0.3–1.2)

## 2016-07-16 LAB — TROPONIN I

## 2016-07-16 LAB — BRAIN NATRIURETIC PEPTIDE: B NATRIURETIC PEPTIDE 5: 15 pg/mL (ref 0.0–100.0)

## 2016-07-16 MED ORDER — AZITHROMYCIN 500 MG PO TABS
ORAL_TABLET | ORAL | Status: DC
Start: 2016-07-16 — End: 2016-07-16
  Filled 2016-07-16: qty 1

## 2016-07-16 MED ORDER — AZITHROMYCIN 250 MG PO TABS: ORAL_TABLET | ORAL | 0 refills | Status: DC

## 2016-07-16 MED ORDER — PREDNISONE 20 MG PO TABS
60.0000 mg | ORAL_TABLET | Freq: Once | ORAL | Status: AC
  Administered 2016-07-16: 60 mg via ORAL

## 2016-07-16 MED ORDER — IOPAMIDOL (ISOVUE-370) INJECTION 76%
75.0000 mL | Freq: Once | INTRAVENOUS | Status: AC | PRN
  Administered 2016-07-16: 75 mL via INTRAVENOUS

## 2016-07-16 MED ORDER — AZITHROMYCIN 500 MG PO TABS
500.0000 mg | ORAL_TABLET | Freq: Once | ORAL | Status: AC
  Administered 2016-07-16: 500 mg via ORAL

## 2016-07-16 MED ORDER — METHYLPREDNISOLONE SODIUM SUCC 125 MG IJ SOLR
125.0000 mg | Freq: Once | INTRAMUSCULAR | Status: AC
  Administered 2016-07-16: 125 mg via INTRAVENOUS
  Filled 2016-07-16: qty 2

## 2016-07-16 MED ORDER — IPRATROPIUM-ALBUTEROL 0.5-2.5 (3) MG/3ML IN SOLN
3.0000 mL | Freq: Once | RESPIRATORY_TRACT | Status: AC
  Administered 2016-07-16: 3 mL via RESPIRATORY_TRACT
  Filled 2016-07-16: qty 3

## 2016-07-16 MED ORDER — ALBUTEROL SULFATE (2.5 MG/3ML) 0.083% IN NEBU
5.0000 mg | INHALATION_SOLUTION | Freq: Once | RESPIRATORY_TRACT | Status: AC
  Administered 2016-07-16: 5 mg via RESPIRATORY_TRACT

## 2016-07-16 MED ORDER — PREDNISONE 20 MG PO TABS
ORAL_TABLET | ORAL | Status: AC
  Filled 2016-07-16: qty 3

## 2016-07-16 MED ORDER — PREDNISONE 10 MG PO TABS: ORAL_TABLET | ORAL | 0 refills | Status: DC

## 2016-07-16 MED ORDER — ALBUTEROL SULFATE (2.5 MG/3ML) 0.083% IN NEBU
INHALATION_SOLUTION | RESPIRATORY_TRACT | Status: AC
  Filled 2016-07-16: qty 6

## 2016-07-16 NOTE — ED Triage Notes (Signed)
Pt comes into the ED via POV c/o shortness of breath.  Pateint present with labored and shallow breathing.  States this has been going on for 1 week and that she has taken 10 neb treatments of albuterol and duoneb.  Patient states these have not helped at all.  Patient's PCP stated she should come straight to the hospital.  Audible wheezing noted on patient.  H/O asthma and COPD.  Known admissions for COPD exacerbations.

## 2016-07-16 NOTE — Discharge Instructions (Signed)
Take the Zithromax as directed. Take prednisone as directed. Start both of them tomorrow. Return if you feel worse. Remember the Zithromax sometimes will give you diarrhea. Return here for bad diarrhea. Follow-up with your doctor on Friday as planned. Return here sooner if you get fever increasing shortness of breath or any other worsening problems.

## 2016-07-16 NOTE — ED Notes (Signed)
EDP made aware of pt 

## 2016-07-16 NOTE — ED Provider Notes (Signed)
[facility] Emergency Department Provider Note   ____________________________________________   First MD Initiated Contact with Patient 07/16/16 1606     (approximate)  I have reviewed the triage vital signs and the nursing notes.   HISTORY  Chief Complaint Shortness of Breath   HPI Caitlyn Evans is a 50 y.o. female with a long medical history. She comes in reporting 1 week of increasing shortness of breath. She has a deep wet cough but is not productive. She's feeling hot and cold but doesn't remember running a fever. She complains of pain in her back and head. She is wheezing. She reports she took 12 breathing treatments today so far.   Past Medical History:  Diagnosis Date  . Anxiety    panic attacks  . Asthma   . BOOP (bronchiolitis obliterans with organizing pneumonia) (HCC)   . CHF (congestive heart failure) (HCC)   . Dysphagia, pharyngoesophageal phase 05/30/2015  . Emphysema lung (HCC) 05/30/2015  . Hypercholesterolemia   . Hypertension   . Lung mass   . Migraines   . Motion sickness    all moving vehicles  . Myocardial infarction 2012  . Seasonal allergies   . Shortness of breath dyspnea    1 flight-stairs    Patient Active Problem List   Diagnosis Date Noted  . Acute on chronic respiratory failure (HCC) 05/09/2016  . Noncompliance with medication regimen 05/09/2016  . COPD with acute exacerbation (HCC) 02/05/2016  . Acute exacerbation of chronic obstructive pulmonary disease (COPD) (HCC)   . Acute respiratory failure (HCC)   . Acute respiratory failure with hypoxia (HCC) 12/05/2015  . BOOP (bronchiolitis obliterans with organizing pneumonia) (HCC) 12/05/2015  . Depression, major, single episode, moderate (HCC) 07/14/2015  . Adjustment disorder with anxiety 07/14/2015  . COPD exacerbation (HCC) 07/14/2015  . Special screening for malignant neoplasms, colon   . Swallowing difficulty   . Loss of weight   . Esophagogastric ulcer    . Esophageal ulcer   . Hypertension 05/30/2015  . Emphysema lung (HCC) 05/30/2015  . Dysphagia, pharyngoesophageal phase 05/30/2015    Past Surgical History:  Procedure Laterality Date  . COLONOSCOPY WITH PROPOFOL N/A 06/02/2015   Procedure: COLONOSCOPY WITH PROPOFOL;  Surgeon: Midge Minium, MD;  Location: [facility];  Service: Endoscopy;  Laterality: N/A;  . ESOPHAGOGASTRODUODENOSCOPY (EGD) WITH PROPOFOL N/A 06/02/2015   Procedure: ESOPHAGOGASTRODUODENOSCOPY (EGD) WITH PROPOFOL withdialation;  Surgeon: Midge Minium, MD;  Location: [facility];  Service: Endoscopy;  Laterality: N/A;  . LUNG SURGERY Left    Upper lobe removed  . OOPHORECTOMY Left   . VAGINAL HYSTERECTOMY      Prior to Admission medications   Medication Sig Start Date End Date Taking? Authorizing Provider  amLODipine (NORVASC) 5 MG tablet Take 1 tablet (5 mg total) by mouth daily. 05/13/16   Enid Baas, MD  azithromycin (ZITHROMAX Z-PAK) 250 MG tablet Take 2 tablets (500 mg) on  Day 1,  followed by 1 tablet (250 mg) once daily on Days 2 through 5. 07/16/16 07/21/16  Arnaldo Natal, MD  benzonatate (TESSALON PERLES) 100 MG capsule Take 2 capsules (200 mg total) by mouth 3 (three) times daily. 05/13/16   Enid Baas, MD  chlorpheniramine-HYDROcodone (TUSSIONEX) 10-8 MG/5ML SUER Take 5 mLs by mouth every 12 (twelve) hours. 05/13/16   Enid Baas, MD  doxycycline (VIBRA-TABS) 100 MG tablet Take 1 tablet (100 mg total) by mouth 2 (two) times daily. X 5 more days 05/13/16   [place]  Nemiah Commander, MD  guaiFENesin (MUCINEX) 600 MG 12 hr tablet Take 1 tablet (600 mg total) by mouth 2 (two) times daily. 05/13/16   Enid Baas, MD  ipratropium-albuterol (DUONEB) 0.5-2.5 (3) MG/3ML SOLN Inhale 3 mLs into the lungs every 4 (four) hours as needed (for wheezing/shortness of breath). 05/13/16   Enid Baas, MD  LORazepam (ATIVAN) 1 MG tablet Take 1 tablet (1 mg total) by mouth every 8 (eight) hours.  05/13/16   Enid Baas, MD  mometasone-formoterol (DULERA) 200-5 MCG/ACT AERO Inhale 2 puffs into the lungs 2 (two) times daily. 05/13/16   Enid Baas, MD  Multiple Vitamins-Minerals (MULTIVITAMIN GUMMIES ADULT) CHEW Chew 1 each by mouth daily.    Historical Provider, MD  oxyCODONE-acetaminophen (PERCOCET) 7.5-325 MG tablet Take 1 tablet by mouth every 8 (eight) hours as needed for moderate pain or severe pain. 05/13/16   Enid Baas, MD  pantoprazole (PROTONIX) 40 MG tablet Take 1 tablet (40 mg total) by mouth daily. 05/13/16   Enid Baas, MD  predniSONE (DELTASONE) 10 MG tablet Take 4 pills tomorrow (07/17/16), then take 2 pills a day till you see your doctor. 07/16/16   Arnaldo Natal, MD  predniSONE (STERAPRED UNI-PAK 21 TAB) 10 MG (21) TBPK tablet Take 1 tablet (10 mg total) by mouth daily. 6 tabs PO x 2 days 5 tabs PO x 2 days 4 tabs PO x 2 days 3 tabs PO x 2 days 2 tabs PO x 2 days 1 tab PO x 2 days and stop 05/13/16   Enid Baas, MD  theophylline (THEODUR) 200 MG 12 hr tablet Take 1 tablet (200 mg total) by mouth 2 (two) times daily. 05/13/16   Enid Baas, MD    Allergies Penicillins and Codeine  Family History  Problem Relation Age of Onset  . COPD Mother   . Heart attack Mother   . Stroke Father   . COPD Father     Social History Social History  Substance Use Topics  . Smoking status: Current Every Day Smoker    Packs/day: 1.00    Years: 30.00  . Smokeless tobacco: Never Used     Comment: quit some time in 2012  . Alcohol use No     Comment: rare consumption    Review of Systems Constitutional: See history of present illness Eyes: No visual changes. ENT: No sore throat. Cardiovascular: Denies chest pain. Respiratory: See history of present illness Gastrointestinal: No abdominal pain.  No nausea, no vomiting.  No diarrhea.  No constipation. Genitourinary: Negative for dysuria. Musculoskeletal: See history of present illness back  pain his upper back Skin: Negative for rash. Neurological: Negative for headaches, focal weakness or numbness.  10-point ROS otherwise negative.  ____________________________________________   PHYSICAL EXAM:  VITAL SIGNS: ED Triage Vitals [07/16/16 1507]  Enc Vitals Group     BP 104/87     Pulse Rate (!) 120     Resp (!) 26     Temp 98 F (36.7 C)     Temp Source Oral     SpO2 97 %     Weight 114 lb (51.7 kg)     Height 4\' 11"  (1.499 m)     Head Circumference      Peak Flow      Pain Score      Pain Loc      Pain Edu?      Excl. in GC?    Constitutional: Alert and oriented. Ill-appearing appearing breathing hard Eyes: Conjunctivae are  normal. PERRL. EOMI. Head: Atraumatic. Nose: No congestion/rhinnorhea. Mouth/Throat: Mucous membranes are moist.  Oropharynx non-erythematous. Neck: No stridor. Cardiovascular: Normal rate, regular rhythm. Grossly normal heart sounds.  Good peripheral circulation. Respiratory: Increased respiratory effort. retractions. Use of accessory muscles Lungs diffuse wheezes some crackles Gastrointestinal: Soft and nontender. No distention. No abdominal bruits. No CVA tenderness. Musculoskeletal: No lower extremity tenderness nor edema.  No joint effusions. Neurologic:  Normal speech and language. No gross focal neurologic deficits are appreciated.. Skin:  Skin is warm, dry and intact. No rash noted.   ____________________________________________   LABS (all labs ordered are listed, but only abnormal results are displayed)  Labs Reviewed  BASIC METABOLIC PANEL - Abnormal; Notable for the following:       Result Value   BUN 21 (*)    All other components within normal limits  CBC - Abnormal; Notable for the following:    RDW 16.5 (*)    All other components within normal limits  HEPATIC FUNCTION PANEL - Abnormal; Notable for the following:    ALT 11 (*)    Bilirubin, Direct <0.1 (*)    All other components within normal limits  BLOOD  GAS, ARTERIAL - Abnormal; Notable for the following:    pO2, Arterial 81 (*)    All other components within normal limits  THEOPHYLLINE LEVEL - Abnormal; Notable for the following:    Theophylline Lvl <2 (*)    All other components within normal limits  BRAIN NATRIURETIC PEPTIDE  TROPONIN I   ____________________________________________  EKG   ____________________________________________  RADIOLOGY Study Result   CLINICAL DATA:  Shortness of breath, labored breathing for the past week unresponsive to nebulizer treatments history of asthma -COPD.  EXAM: PORTABLE CHEST 1 VIEW  COMPARISON:  Chest x-ray and chest CT scan of May 09, 2016  FINDINGS: The lungs remain hyperinflated. There is stable pleural thickening on the left. On the right the pleural thickening is slightly more conspicuous than on the August radiographic studies. There is no pleural effusion or pneumothorax. The heart and pulmonary vascularity are normal. The mediastinum is normal in width. The bony thorax exhibits no acute abnormality.  IMPRESSION: COPD, stable.  Slight increased prominence of soft tissue density in the right pulmonary apex. This had been stable between the May 2017 and August 2017 CT scans and chest x-rays. This increased conspicuity may reflect focal infection superimposed upon underlying chronic parenchymal changes.  Followup PA and lateral chest X-ray is recommended in 3-4 weeks following trial of antibiotic therapy to ensure resolution and exclude underlying malignancy.   Electronically Signed   By: David  SwazilandJordan M.D.   On: 07/16/2016 16:38   Study Result   CLINICAL DATA:  Shortness of breath  EXAM: CT ANGIOGRAPHY CHEST WITH CONTRAST  TECHNIQUE: Multidetector CT imaging of the chest was performed using the standard protocol during bolus administration of intravenous contrast. Multiplanar CT image reconstructions and MIPs were obtained to evaluate the  vascular anatomy.  CONTRAST:  75 mL Isovue 370 IV  COMPARISON:  Chest radiograph 07/16/2016, chest CT 01/31/2016  FINDINGS: Cardiovascular: There is satisfactory opacification of the pulmonary arteries to the segmental level. There is no pulmonary embolus. The main pulmonary artery is within normal limits for size, measuring 2.1 cm at the bifurcation. There is no CT evidence of acute right heart strain. There is mild aortic atherosclerotic calcification. There is a normal 3-vessel arch branching pattern. Heart size is normal, without pericardial effusion.  Mediastinum/Nodes: No mediastinal, hilar or axillary lymphadenopathy.  The visualized thyroid and thoracic esophageal course are unremarkable.  Lungs/Pleura: There is apical predominant paraseptal emphysema. There are multiple ground-glass opacities in the right upper lobe. There are postsurgical changes of the left upper lobe.  Upper Abdomen: Contrast bolus timing is not optimized for evaluation of the abdominal organs. Within this limitation, the visualized organs of the upper abdomen are normal.  Musculoskeletal: No bony spinal canal stenosis. No lytic or blastic lesions.  Review of the MIP images confirms the above findings.  IMPRESSION: 1. No pulmonary embolus. 2. Mild aortic atherosclerosis. 3. Right upper lobe ground-glass opacities, favored to be infectious or inflammatory. Non-contrast chest CT at 3-6 months is recommended. If nodules persist and are stable at that time, consider additional non-contrast chest CT examinations at 2 and 4 years. This recommendation follows the consensus statement: Guidelines for Management of Incidental Pulmonary Nodules Detected on CT Images: From the Fleischner Society 2017; Radiology 2017; 284:228-243.   Electronically Signed   By: Deatra RobinsonKevin  Herman M.D.   On: 07/16/2016 18:00     ____________________________________________   PROCEDURES  Procedure(s) performed:   Procedures  Critical Care performed:  ____________________________________________   INITIAL IMPRESSION / ASSESSMENT AND PLAN / ED COURSE  Pertinent labs & imaging results that were available during my care of the patient were reviewed by me and considered in my medical decision making (see chart for details).   Clinical Course  Patient much better after a site Medrol and a nebulizer. Patient reports she is out of her 20 mg a day prednisone. She feels much better she wants to go home. Her sats are 97 on room air. I will let her go home with some antibiotics and some more prednisone.   ____________________________________________   FINAL CLINICAL IMPRESSION(S) / ED DIAGNOSES  Final diagnoses:  COPD exacerbation (HCC)      NEW MEDICATIONS STARTED DURING THIS VISIT:  New Prescriptions   AZITHROMYCIN (ZITHROMAX Z-PAK) 250 MG TABLET    Take 2 tablets (500 mg) on  Day 1,  followed by 1 tablet (250 mg) once daily on Days 2 through 5.   PREDNISONE (DELTASONE) 10 MG TABLET    Take 4 pills tomorrow (07/17/16), then take 2 pills a day till you see your doctor.     Note:  This document was prepared using Dragon voice recognition software and may include unintentional dictation errors.    Arnaldo NatalPaul F Kavir Savoca, MD 07/16/16 [phone]

## 2016-07-19 ENCOUNTER — Inpatient Hospital Stay
Admission: EM | Admit: 2016-07-19 | Discharge: 2016-07-20 | DRG: 189 | Disposition: A | Discharge status: Home / Self Care | Payer: Medicare Other | Attending: Internal Medicine | Admitting: Internal Medicine

## 2016-07-19 ENCOUNTER — Emergency Department: Payer: Medicare Other

## 2016-07-19 ENCOUNTER — Encounter: Payer: Self-pay | Admitting: Emergency Medicine

## 2016-07-19 DIAGNOSIS — Z885 Allergy status to narcotic agent status: Secondary | ICD-10-CM | POA: Diagnosis not present

## 2016-07-19 DIAGNOSIS — Z880 Allergy status to penicillin: Secondary | ICD-10-CM

## 2016-07-19 DIAGNOSIS — I509 Heart failure, unspecified: Secondary | ICD-10-CM | POA: Clinically undetermined

## 2016-07-19 DIAGNOSIS — J441 Chronic obstructive pulmonary disease with (acute) exacerbation: Secondary | ICD-10-CM | POA: Diagnosis present

## 2016-07-19 DIAGNOSIS — Z8249 Family history of ischemic heart disease and other diseases of the circulatory system: Secondary | ICD-10-CM | POA: Diagnosis not present

## 2016-07-19 DIAGNOSIS — J9601 Acute respiratory failure with hypoxia: Secondary | ICD-10-CM | POA: Diagnosis present

## 2016-07-19 DIAGNOSIS — J449 Chronic obstructive pulmonary disease, unspecified: Secondary | ICD-10-CM | POA: Diagnosis present

## 2016-07-19 DIAGNOSIS — J8489 Other specified interstitial pulmonary diseases: Secondary | ICD-10-CM | POA: Diagnosis present

## 2016-07-19 DIAGNOSIS — E7800 Pure hypercholesterolemia, unspecified: Secondary | ICD-10-CM | POA: Diagnosis present

## 2016-07-19 DIAGNOSIS — J209 Acute bronchitis, unspecified: Secondary | ICD-10-CM | POA: Diagnosis present

## 2016-07-19 DIAGNOSIS — R1314 Dysphagia, pharyngoesophageal phase: Secondary | ICD-10-CM | POA: Diagnosis present

## 2016-07-19 DIAGNOSIS — I110 Hypertensive heart disease with heart failure: Secondary | ICD-10-CM | POA: Diagnosis present

## 2016-07-19 DIAGNOSIS — Z7952 Long term (current) use of systemic steroids: Secondary | ICD-10-CM | POA: Diagnosis not present

## 2016-07-19 DIAGNOSIS — Z79899 Other long term (current) drug therapy: Secondary | ICD-10-CM

## 2016-07-19 DIAGNOSIS — K219 Gastro-esophageal reflux disease without esophagitis: Secondary | ICD-10-CM | POA: Diagnosis present

## 2016-07-19 DIAGNOSIS — R0602 Shortness of breath: Secondary | ICD-10-CM | POA: Diagnosis present

## 2016-07-19 DIAGNOSIS — I252 Old myocardial infarction: Secondary | ICD-10-CM

## 2016-07-19 DIAGNOSIS — F17210 Nicotine dependence, cigarettes, uncomplicated: Secondary | ICD-10-CM | POA: Diagnosis present

## 2016-07-19 LAB — BASIC METABOLIC PANEL
ANION GAP: 8 (ref 5–15)
BUN: 14 mg/dL (ref 6–20)
CHLORIDE: 108 mmol/L (ref 101–111)
CO2: 26 mmol/L (ref 22–32)
Calcium: 9.2 mg/dL (ref 8.9–10.3)
Creatinine, Ser: 0.71 mg/dL (ref 0.44–1.00)
GFR calc Af Amer: >60 mL/min (ref >60)
GLUCOSE: 78 mg/dL (ref 65–99)
POTASSIUM: 4 mmol/L (ref 3.5–5.1)
Sodium: 142 mmol/L (ref 135–145)

## 2016-07-19 LAB — CBC WITH DIFFERENTIAL/PLATELET
BASOS ABS: 0.2 10*3/uL — AB (ref 0–0.1)
Basophils Relative: 2 %
Eosinophils Absolute: 0.2 10*3/uL (ref 0–0.7)
Eosinophils Relative: 2 %
HEMATOCRIT: 46.6 % (ref 35.0–47.0)
Hemoglobin: 15 g/dL (ref 12.0–16.0)
LYMPHS ABS: 3.2 10*3/uL (ref 1.0–3.6)
LYMPHS PCT: 31 %
MCH: 28.7 pg (ref 26.0–34.0)
MCHC: 32.2 g/dL (ref 32.0–36.0)
MCV: 89.4 fL (ref 80.0–100.0)
MONO ABS: 1.5 10*3/uL — AB (ref 0.2–0.9)
Monocytes Relative: 14 %
NEUTROS ABS: 5.3 10*3/uL (ref 1.4–6.5)
Neutrophils Relative %: 51 %
Platelets: 268 10*3/uL (ref 150–440)
RBC: 5.22 MIL/uL — ABNORMAL HIGH (ref 3.80–5.20)
RDW: 16.7 % — AB (ref 11.5–14.5)
WBC: 10.4 10*3/uL (ref 3.6–11.0)

## 2016-07-19 LAB — BLOOD GAS, VENOUS
Acid-Base Excess: 3.4 mmol/L — ABNORMAL HIGH (ref 0.0–2.0)
BICARBONATE: 29.1 mmol/L — AB (ref 20.0–28.0)
O2 Saturation: 67.3 %
PH VEN: 7.4 (ref 7.250–7.430)
Patient temperature: 37
pCO2, Ven: 47 mmHg (ref 44.0–60.0)
pO2, Ven: 35 mmHg (ref 32.0–45.0)

## 2016-07-19 LAB — TROPONIN I: Troponin I: <0.03 ng/mL (ref <0.03)

## 2016-07-19 MED ORDER — IPRATROPIUM-ALBUTEROL 0.5-2.5 (3) MG/3ML IN SOLN
3.0000 mL | RESPIRATORY_TRACT | Status: DC
  Administered 2016-07-19 – 2016-07-20 (×8): 3 mL via RESPIRATORY_TRACT
  Filled 2016-07-19 (×8): qty 3

## 2016-07-19 MED ORDER — IPRATROPIUM-ALBUTEROL 0.5-2.5 (3) MG/3ML IN SOLN
RESPIRATORY_TRACT | Status: AC
  Administered 2016-07-19: 3 mL via RESPIRATORY_TRACT
  Filled 2016-07-19: qty 3

## 2016-07-19 MED ORDER — ADULT MULTIVITAMIN W/MINERALS CH
1.0000 | ORAL_TABLET | Freq: Every day | ORAL | Status: DC
  Administered 2016-07-19: 1 via ORAL
  Filled 2016-07-19: qty 1

## 2016-07-19 MED ORDER — METHYLPREDNISOLONE SODIUM SUCC 125 MG IJ SOLR
60.0000 mg | Freq: Three times a day (TID) | INTRAMUSCULAR | Status: DC
  Administered 2016-07-19 – 2016-07-20 (×4): 60 mg via INTRAVENOUS
  Filled 2016-07-19 (×5): qty 2

## 2016-07-19 MED ORDER — MOMETASONE FURO-FORMOTEROL FUM 200-5 MCG/ACT IN AERO
2.0000 | INHALATION_SPRAY | Freq: Two times a day (BID) | RESPIRATORY_TRACT | Status: DC
  Administered 2016-07-19 – 2016-07-20 (×3): 2 via RESPIRATORY_TRACT
  Filled 2016-07-19: qty 8.8

## 2016-07-19 MED ORDER — HYDROCOD POLST-CPM POLST ER 10-8 MG/5ML PO SUER
5.0000 mL | Freq: Two times a day (BID) | ORAL | Status: DC
  Administered 2016-07-19 – 2016-07-20 (×3): 5 mL via ORAL
  Filled 2016-07-19 (×3): qty 5

## 2016-07-19 MED ORDER — LORAZEPAM 1 MG PO TABS
ORAL_TABLET | ORAL | Status: AC
  Administered 2016-07-19: 1 mg via ORAL
  Filled 2016-07-19: qty 1

## 2016-07-19 MED ORDER — ONDANSETRON HCL 4 MG/2ML IJ SOLN: 4.0000 mg | Freq: Four times a day (QID) | INTRAMUSCULAR | Status: DC | PRN

## 2016-07-19 MED ORDER — ALBUTEROL SULFATE (2.5 MG/3ML) 0.083% IN NEBU
5.0000 mg | INHALATION_SOLUTION | Freq: Once | RESPIRATORY_TRACT | Status: AC
Start: 2016-07-19 — End: 2016-07-19
  Administered 2016-07-19: 5 mg via RESPIRATORY_TRACT

## 2016-07-19 MED ORDER — IPRATROPIUM-ALBUTEROL 0.5-2.5 (3) MG/3ML IN SOLN: 3.0000 mL | RESPIRATORY_TRACT | Status: DC | PRN

## 2016-07-19 MED ORDER — ACETAMINOPHEN 500 MG PO TABS
1000.0000 mg | ORAL_TABLET | Freq: Once | ORAL | Status: AC
  Administered 2016-07-19: 1000 mg via ORAL
  Filled 2016-07-19: qty 2

## 2016-07-19 MED ORDER — SENNOSIDES-DOCUSATE SODIUM 8.6-50 MG PO TABS: 1.0000 | ORAL_TABLET | Freq: Every evening | ORAL | Status: DC | PRN

## 2016-07-19 MED ORDER — GUAIFENESIN ER 600 MG PO TB12
600.0000 mg | ORAL_TABLET | Freq: Two times a day (BID) | ORAL | Status: DC
  Administered 2016-07-19 – 2016-07-20 (×3): 600 mg via ORAL
  Filled 2016-07-19 (×3): qty 1

## 2016-07-19 MED ORDER — METHYLPREDNISOLONE SODIUM SUCC 125 MG IJ SOLR
125.0000 mg | Freq: Once | INTRAMUSCULAR | Status: AC
  Administered 2016-07-19: 125 mg via INTRAVENOUS

## 2016-07-19 MED ORDER — AMLODIPINE BESYLATE 5 MG PO TABS
5.0000 mg | ORAL_TABLET | Freq: Every day | ORAL | Status: DC
  Administered 2016-07-19 – 2016-07-20 (×2): 5 mg via ORAL
  Filled 2016-07-19 (×2): qty 1

## 2016-07-19 MED ORDER — ACETAMINOPHEN 650 MG RE SUPP: 650.0000 mg | Freq: Four times a day (QID) | RECTAL | Status: DC | PRN

## 2016-07-19 MED ORDER — BENZONATATE 100 MG PO CAPS
200.0000 mg | ORAL_CAPSULE | Freq: Three times a day (TID) | ORAL | Status: DC
  Administered 2016-07-19 – 2016-07-20 (×3): 200 mg via ORAL
  Filled 2016-07-19 (×3): qty 2

## 2016-07-19 MED ORDER — LEVOFLOXACIN 500 MG PO TABS
500.0000 mg | ORAL_TABLET | Freq: Every day | ORAL | Status: DC
  Administered 2016-07-20: 500 mg via ORAL
  Filled 2016-07-19: qty 1

## 2016-07-19 MED ORDER — IPRATROPIUM-ALBUTEROL 0.5-2.5 (3) MG/3ML IN SOLN
3.0000 mL | Freq: Once | RESPIRATORY_TRACT | Status: AC
  Administered 2016-07-19: 3 mL via RESPIRATORY_TRACT

## 2016-07-19 MED ORDER — ENOXAPARIN SODIUM 40 MG/0.4ML ~~LOC~~ SOLN: 40.0000 mg | SUBCUTANEOUS | Status: DC

## 2016-07-19 MED ORDER — PANTOPRAZOLE SODIUM 40 MG PO TBEC
40.0000 mg | DELAYED_RELEASE_TABLET | Freq: Every day | ORAL | Status: DC
  Administered 2016-07-19 – 2016-07-20 (×2): 40 mg via ORAL
  Filled 2016-07-19 (×2): qty 1

## 2016-07-19 MED ORDER — METHYLPREDNISOLONE SODIUM SUCC 125 MG IJ SOLR
INTRAMUSCULAR | Status: AC
  Administered 2016-07-19: 125 mg via INTRAVENOUS
  Filled 2016-07-19: qty 2

## 2016-07-19 MED ORDER — ALBUTEROL SULFATE (2.5 MG/3ML) 0.083% IN NEBU
INHALATION_SOLUTION | RESPIRATORY_TRACT | Status: AC
  Administered 2016-07-19: 5 mg via RESPIRATORY_TRACT
  Filled 2016-07-19: qty 6

## 2016-07-19 MED ORDER — ONDANSETRON HCL 4 MG PO TABS: 4.0000 mg | ORAL_TABLET | Freq: Four times a day (QID) | ORAL | Status: DC | PRN

## 2016-07-19 MED ORDER — LORAZEPAM 1 MG PO TABS
1.0000 mg | ORAL_TABLET | Freq: Two times a day (BID) | ORAL | Status: DC
  Administered 2016-07-19 – 2016-07-20 (×3): 1 mg via ORAL
  Filled 2016-07-19 (×2): qty 1

## 2016-07-19 MED ORDER — OXYCODONE-ACETAMINOPHEN 7.5-325 MG PO TABS
1.0000 | ORAL_TABLET | Freq: Three times a day (TID) | ORAL | Status: DC | PRN
  Administered 2016-07-19 – 2016-07-20 (×3): 1 via ORAL
  Filled 2016-07-19 (×4): qty 1

## 2016-07-19 MED ORDER — LEVOFLOXACIN 500 MG PO TABS
500.0000 mg | ORAL_TABLET | Freq: Once | ORAL | Status: AC
  Administered 2016-07-19: 500 mg via ORAL
  Filled 2016-07-19: qty 1

## 2016-07-19 MED ORDER — NICOTINE 21 MG/24HR TD PT24
21.0000 mg | MEDICATED_PATCH | Freq: Every day | TRANSDERMAL | Status: DC
  Administered 2016-07-19 – 2016-07-20 (×2): 21 mg via TRANSDERMAL
  Filled 2016-07-19 (×2): qty 1

## 2016-07-19 MED ORDER — ACETAMINOPHEN 325 MG PO TABS
650.0000 mg | ORAL_TABLET | Freq: Four times a day (QID) | ORAL | Status: DC | PRN
  Administered 2016-07-19 – 2016-07-20 (×2): 650 mg via ORAL
  Filled 2016-07-19 (×2): qty 2

## 2016-07-19 MED ORDER — THEOPHYLLINE ER 200 MG PO TB12
200.0000 mg | ORAL_TABLET | Freq: Two times a day (BID) | ORAL | Status: DC
  Administered 2016-07-19: 200 mg via ORAL
  Filled 2016-07-19 (×2): qty 1

## 2016-07-19 NOTE — Progress Notes (Signed)
Patient refused lovenox and scd's. States she is not a fall risk, she is steady on her feet and she ambulates and moves frequently. Pt educated on risk and verbalized understanding.

## 2016-07-19 NOTE — Progress Notes (Signed)
Pharmacy Antibiotic Note  Caitlyn Evans is a 50 y.o. female admitted on 07/19/2016 with COPD exaccerbation.  Pharmacy has been consulted for levofloxacin dosing.  Plan: Levofloxacin 500mg  PO daily  Changed MVI to nighttime administration to avoid coadministration with Levaquin.   Height: 4\' 11"  (149.9 cm) Weight: 111 lb 6.4 oz (50.5 kg) IBW/kg (Calculated) : 43.2  Temp (24hrs), Avg:98.2 F (36.8 C), Min:98.2 F (36.8 C), Max:98.2 F (36.8 C)   Recent Labs Lab 07/16/16 1514 07/19/16 0739  WBC 9.2 10.4  CREATININE 0.75 0.71    Estimated Creatinine Clearance: 56.1 mL/min (by C-G formula based on SCr of 0.71 mg/dL).    Allergies  Allergen Reactions  . Penicillins Hives and Other (See Comments)    Has patient had a PCN reaction causing immediate rash, facial/tongue/throat swelling, SOB or lightheadedness with hypotension: No Has patient had a PCN reaction causing severe rash involving mucus membranes or skin necrosis: No Has patient had a PCN reaction that required hospitalization No Has patient had a PCN reaction occurring within the last 10 years: No If all of the above answers are "NO", then may proceed with Cephalosporin use.  . Codeine Hives and Nausea And Vomiting    Antimicrobials this admission: 11/3 Levaquin >>  Thank you for allowing pharmacy to be a part of this patient's care.  Caitlyn Evans C 07/19/2016 2:37 PM

## 2016-07-19 NOTE — H&P (Addendum)
[facility] [place] [facility]   PATIENT NAME: Caitlyn Evans    MR#:  [id number]  DATE OF BIRTH:  [birth year]  DATE OF ADMISSION:  07/19/2016  PRIMARY CARE PHYSICIAN: Corky Downs, MD   REQUESTING/REFERRING PHYSICIAN: Dr Scotty Court  CHIEF COMPLAINT:   Shortness of breath HISTORY OF PRESENT ILLNESS:  Caitlyn Evans  is a 50 y.o. female with a known history of COPD and BOOP who presents with increasing shortness of breath, wheezing and chest tightness. Patient reports that she was in the emergency room a few days ago with shortness of breath. She underwent extensive workup including CT scan of the chest which was negative for PE and received bronchodilators. She responded well to this regimen and went home. However over the past 2 days she has had increasing shortness of breath and wheezing. In the emergency room she was found to have COPD exacerbation. She has received nebulizer treatments but still has diffuse wheezing and chest tightness.  PAST MEDICAL HISTORY:   Past Medical History:  Diagnosis Date  . Anxiety    panic attacks  . Asthma   . BOOP (bronchiolitis obliterans with organizing pneumonia) (HCC)   . CHF (congestive heart failure) (HCC)   . Dysphagia, pharyngoesophageal phase 05/30/2015  . Emphysema lung (HCC) 05/30/2015  . Hypercholesterolemia   . Hypertension   . Lung mass   . Migraines   . Motion sickness    all moving vehicles  . Myocardial infarction 2012  . Seasonal allergies   . Shortness of breath dyspnea    1 flight-stairs    PAST SURGICAL HISTORY:   Past Surgical History:  Procedure Laterality Date  . COLONOSCOPY WITH PROPOFOL N/A 06/02/2015   Procedure: COLONOSCOPY WITH PROPOFOL;  Surgeon: Midge Minium, MD;  Location: [facility];  Service: Endoscopy;  Laterality: N/A;  . ESOPHAGOGASTRODUODENOSCOPY (EGD) WITH PROPOFOL N/A 06/02/2015   Procedure: ESOPHAGOGASTRODUODENOSCOPY (EGD) WITH PROPOFOL withdialation;  Surgeon: Midge Minium,  MD;  Location: [facility];  Service: Endoscopy;  Laterality: N/A;  . LUNG SURGERY Left    Upper lobe removed  . OOPHORECTOMY Left   . VAGINAL HYSTERECTOMY      SOCIAL HISTORY:   Social History  Substance Use Topics  . Smoking status: Current Every Day Smoker    Packs/day: 1.00    Years: 30.00  . Smokeless tobacco: Never Used     Comment: quit some time in 2012  . Alcohol use No     Comment: rare consumption    FAMILY HISTORY:   Family History  Problem Relation Age of Onset  . COPD Mother   . Heart attack Mother   . Stroke Father   . COPD Father     DRUG ALLERGIES:   Allergies  Allergen Reactions  . Penicillins Hives and Other (See Comments)    Has patient had a PCN reaction causing immediate rash, facial/tongue/throat swelling, SOB or lightheadedness with hypotension: No Has patient had a PCN reaction causing severe rash involving mucus membranes or skin necrosis: No Has patient had a PCN reaction that required hospitalization No Has patient had a PCN reaction occurring within the last 10 years: No If all of the above answers are "NO", then may proceed with Cephalosporin use.  . Codeine Hives and Nausea And Vomiting    REVIEW OF SYSTEMS:   Review of Systems  Constitutional: Negative.  Negative for chills, fever and malaise/fatigue.  HENT: Negative.  Negative for ear discharge, ear pain, hearing loss, nosebleeds and sore throat.  Eyes: Negative.  Negative for blurred vision and pain.  Respiratory: Positive for cough, shortness of breath and wheezing. Negative for hemoptysis.   Cardiovascular: Negative.  Negative for chest pain, palpitations and leg swelling.       Chest tightness  Gastrointestinal: Negative.  Negative for abdominal pain, blood in stool, diarrhea, nausea and vomiting.  Genitourinary: Negative.  Negative for dysuria.  Musculoskeletal: Negative.  Negative for back pain.  Skin: Negative.   Neurological: Negative for dizziness, tremors,  speech change, focal weakness, seizures and headaches.  Endo/Heme/Allergies: Negative.  Does not bruise/bleed easily.  Psychiatric/Behavioral: Negative.  Negative for depression, hallucinations and suicidal ideas.    MEDICATIONS AT HOME:   Prior to Admission medications   Medication Sig Start Date End Date Taking? Authorizing Provider  amLODipine (NORVASC) 5 MG tablet Take 1 tablet (5 mg total) by mouth daily. 05/13/16  Yes Enid Baasadhika Kalisetti, MD  azithromycin (ZITHROMAX Z-PAK) 250 MG tablet Take 2 tablets (500 mg) on  Day 1,  followed by 1 tablet (250 mg) once daily on Days 2 through 5. 07/16/16 07/21/16 Yes Arnaldo NatalPaul F Malinda, MD  ipratropium-albuterol (DUONEB) 0.5-2.5 (3) MG/3ML SOLN Inhale 3 mLs into the lungs every 4 (four) hours as needed (for wheezing/shortness of breath). 05/13/16  Yes Enid Baasadhika Kalisetti, MD  LORazepam (ATIVAN) 1 MG tablet Take 1 tablet (1 mg total) by mouth every 8 (eight) hours. Patient taking differently: Take 1 mg by mouth 2 (two) times daily.  05/13/16  Yes Enid Baasadhika Kalisetti, MD  Multiple Vitamins-Minerals (MULTIVITAMIN GUMMIES ADULT) CHEW Chew 2 each by mouth daily.    Yes Historical Provider, MD  pantoprazole (PROTONIX) 40 MG tablet Take 1 tablet (40 mg total) by mouth daily. 05/13/16  Yes Enid Baasadhika Kalisetti, MD  predniSONE (DELTASONE) 10 MG tablet Take 4 pills tomorrow (07/17/16), then take 2 pills a day till you see your doctor. 07/16/16  Yes Arnaldo NatalPaul F Malinda, MD      VITAL SIGNS:  Blood pressure 140/88, pulse (!) 105, temperature 98.2 F (36.8 C), temperature source Oral, resp. rate (!) 25, height 4\' 11"  (1.499 m), weight 51.7 kg (114 lb), SpO2 100 %.  PHYSICAL EXAMINATION:   Physical Exam    LABORATORY PANEL:   CBC  Recent Labs Lab 07/19/16 0739  WBC 10.4  HGB 15.0  HCT 46.6  PLT 268   ------------------------------------------------------------------------------------------------------------------  Chemistries   Recent Labs Lab 07/16/16 1514  07/19/16 0739  NA 140 142  K 4.0 4.0  CL 107 108  CO2 25 26  GLUCOSE 93 78  BUN 21* 14  CREATININE 0.75 0.71  CALCIUM 9.2 9.2  AST 20  --   ALT 11*  --   ALKPHOS 61  --   BILITOT 0.6  --    ------------------------------------------------------------------------------------------------------------------  Cardiac Enzymes  Recent Labs Lab 07/19/16 0739  TROPONINI <0.03   ------------------------------------------------------------------------------------------------------------------  RADIOLOGY:  Dg Chest Portable 1 View  Result Date: 07/19/2016 CLINICAL DATA:  Shortness of breath and chest pain EXAM: PORTABLE CHEST 1 VIEW COMPARISON:  July 16, 2016 chest radiograph and chest CT July 16, 2016 FINDINGS: The lungs are mildly hyperexpanded. There is mild scarring in the left base. There is also scarring in the right apex. There is no appreciable edema or consolidation. There is postoperative change in the left upper lobe. Heart size and pulmonary vascularity are within normal limits. There is atherosclerotic calcification in the aorta. No adenopathy. No bone lesions. No pneumothorax. IMPRESSION: Stable areas of lung scarring. No edema or consolidation. Postoperative change  left upper lobe region. There is aortic atherosclerosis. Electronically Signed   By: Bretta BangWilliam  Woodruff III M.D.   On: 07/19/2016 08:07    EKG:   Sinus tachycardia poor R-wave progression in anterior leads. No ST elevation or depression IMPRESSION AND PLAN:   50 year old female with a history of COPD not requiring oxygen and BOOP who presents with acute hypoxic respiratory failure in the setting of COPD exacerbation.  1. Acute hypoxic respiratory failure: Patient will be treated for COPD exacerbation as outlined below. 2. COPD acute exacerbation with chest tightness: Patient will start on IV steroids, DuoNeb's and inhalers. Wean oxygen as tolerated.  3. History of BOOP: Patient would benefit from  outpatient pulmonary follow-up.  4. Anxiety: Patient has been out of Ativan for the past 3 days. Restart this medication.  5. GERD: Continue PPI  6. Essential hypertension: Continue Norvasc 7. Tobacco dependence: Patient still smokes. She wants to quit smoking. Patient counseled for 3 minutes. I will place nicotine patch.  All the records are reviewed and case discussed with ED provider. Management plans discussed with the patient and she in agreement  CODE STATUS: FULL  TOTAL TIME TAKING CARE OF THIS PATIENT: 45 minutes.    Colum Colt M.D on 07/19/2016 at 11:02 AM  Between 7am to 6pm - Pager - [phone]  After 6pm go to [email or web address] - Social research officer, governmentpassword [email or web address] [email or web address]  [facility]  Office  [phone]  CC: Primary care physician; Corky DownsMASOUD,JAVED, MD

## 2016-07-19 NOTE — ED Provider Notes (Signed)
[facility] Emergency Department Provider Note  ____________________________________________  Time seen: Approximately 8:22 AM  I have reviewed the triage vital signs and the nursing notes.   HISTORY  Chief Complaint Shortness of Breath and Chest Pain    HPI Caitlyn Evans is a 50 y.o. female who complains of worsening shortness of breath and chest pain over the last few days. She was seen in the ED 3 days ago complaining of shortness of breath, underwent an extensive workup with bronchodilators, steroids, chest x-ray, CT angiogram of the chest. Workup was negative for serious pathology and the patient was feeling better so she was discharged home with prednisone and azithromycin. However, she reports that she has continued to worsen over this time. And is still having the same symptoms. She gets more short of breath when she walks. She has diffuse chest tightness which is nonradiating and not associated with vomiting or diaphoresis and nonexertional.     Past Medical History:  Diagnosis Date  . Anxiety    panic attacks  . Asthma   . BOOP (bronchiolitis obliterans with organizing pneumonia) (HCC)   . CHF (congestive heart failure) (HCC)   . Dysphagia, pharyngoesophageal phase 05/30/2015  . Emphysema lung (HCC) 05/30/2015  . Hypercholesterolemia   . Hypertension   . Lung mass   . Migraines   . Motion sickness    all moving vehicles  . Myocardial infarction 2012  . Seasonal allergies   . Shortness of breath dyspnea    1 flight-stairs     Patient Active Problem List   Diagnosis Date Noted  . Acute on chronic respiratory failure (HCC) 05/09/2016  . Noncompliance with medication regimen 05/09/2016  . COPD with acute exacerbation (HCC) 02/05/2016  . Acute exacerbation of chronic obstructive pulmonary disease (COPD) (HCC)   . Acute respiratory failure (HCC)   . Acute respiratory failure with hypoxia (HCC) 12/05/2015  . BOOP (bronchiolitis obliterans  with organizing pneumonia) (HCC) 12/05/2015  . Depression, major, single episode, moderate (HCC) 07/14/2015  . Adjustment disorder with anxiety 07/14/2015  . COPD exacerbation (HCC) 07/14/2015  . Special screening for malignant neoplasms, colon   . Swallowing difficulty   . Loss of weight   . Esophagogastric ulcer   . Esophageal ulcer   . Hypertension 05/30/2015  . Emphysema lung (HCC) 05/30/2015  . Dysphagia, pharyngoesophageal phase 05/30/2015     Past Surgical History:  Procedure Laterality Date  . COLONOSCOPY WITH PROPOFOL N/A 06/02/2015   Procedure: COLONOSCOPY WITH PROPOFOL;  Surgeon: Midge Miniumarren Wohl, MD;  [facility];  Service: Endoscopy;  Laterality: N/A;  . ESOPHAGOGASTRODUODENOSCOPY (EGD) WITH PROPOFOL N/A 06/02/2015   Procedure: ESOPHAGOGASTRODUODENOSCOPY (EGD) WITH PROPOFOL withdialation;  Surgeon: Midge Miniumarren Wohl, MD;  [facility];  Service: Endoscopy;  Laterality: N/A;  . LUNG SURGERY Left    Upper lobe removed  . OOPHORECTOMY Left   . VAGINAL HYSTERECTOMY       Prior to Admission medications   Medication Sig Start Date End Date Taking? Authorizing Provider  amLODipine (NORVASC) 5 MG tablet Take 1 tablet (5 mg total) by mouth daily. 05/13/16   Enid Baasadhika Kalisetti, MD  azithromycin (ZITHROMAX Z-PAK) 250 MG tablet Take 2 tablets (500 mg) on  Day 1,  followed by 1 tablet (250 mg) once daily on Days 2 through 5. 07/16/16 07/21/16  Arnaldo NatalPaul F Malinda, MD  benzonatate (TESSALON PERLES) 100 MG capsule Take 2 capsules (200 mg total) by mouth 3 (three) times daily. 05/13/16   Enid Baasadhika Kalisetti,  MD  chlorpheniramine-HYDROcodone (TUSSIONEX) 10-8 MG/5ML SUER Take 5 mLs by mouth every 12 (twelve) hours. 05/13/16   Enid Baas, MD  doxycycline (VIBRA-TABS) 100 MG tablet Take 1 tablet (100 mg total) by mouth 2 (two) times daily. X 5 more days 05/13/16   Enid Baas, MD  guaiFENesin (MUCINEX) 600 MG 12 hr tablet Take 1 tablet (600 mg total) by mouth 2 (two)  times daily. 05/13/16   Enid Baas, MD  ipratropium-albuterol (DUONEB) 0.5-2.5 (3) MG/3ML SOLN Inhale 3 mLs into the lungs every 4 (four) hours as needed (for wheezing/shortness of breath). 05/13/16   Enid Baas, MD  LORazepam (ATIVAN) 1 MG tablet Take 1 tablet (1 mg total) by mouth every 8 (eight) hours. 05/13/16   Enid Baas, MD  mometasone-formoterol (DULERA) 200-5 MCG/ACT AERO Inhale 2 puffs into the lungs 2 (two) times daily. 05/13/16   Enid Baas, MD  Multiple Vitamins-Minerals (MULTIVITAMIN GUMMIES ADULT) CHEW Chew 1 each by mouth daily.    Historical Provider, MD  oxyCODONE-acetaminophen (PERCOCET) 7.5-325 MG tablet Take 1 tablet by mouth every 8 (eight) hours as needed for moderate pain or severe pain. 05/13/16   Enid Baas, MD  pantoprazole (PROTONIX) 40 MG tablet Take 1 tablet (40 mg total) by mouth daily. 05/13/16   Enid Baas, MD  predniSONE (DELTASONE) 10 MG tablet Take 4 pills tomorrow (07/17/16), then take 2 pills a day till you see your doctor. 07/16/16   Arnaldo Natal, MD  predniSONE (STERAPRED UNI-PAK 21 TAB) 10 MG (21) TBPK tablet Take 1 tablet (10 mg total) by mouth daily. 6 tabs PO x 2 days 5 tabs PO x 2 days 4 tabs PO x 2 days 3 tabs PO x 2 days 2 tabs PO x 2 days 1 tab PO x 2 days and stop 05/13/16   Enid Baas, MD  theophylline (THEODUR) 200 MG 12 hr tablet Take 1 tablet (200 mg total) by mouth 2 (two) times daily. 05/13/16   Enid Baas, MD     Allergies Penicillins and Codeine   Family History  Problem Relation Age of Onset  . COPD Mother   . Heart attack Mother   . Stroke Father   . COPD Father     Social History Social History  Substance Use Topics  . Smoking status: Current Every Day Smoker    Packs/day: 1.00    Years: 30.00  . Smokeless tobacco: Never Used     Comment: quit some time in 2012  . Alcohol use No     Comment: rare consumption    Review of Systems  Constitutional:   No fever or  chills.  ENT:   No sore throat. No rhinorrhea. Cardiovascular:   Positive as above chest pain. Respiratory:   Positive shortness of breath and cough nonproductive. Gastrointestinal:   Negative for abdominal pain, vomiting and diarrhea.   10-point ROS otherwise negative.  ____________________________________________   PHYSICAL EXAM:  VITAL SIGNS: ED Triage Vitals  Enc Vitals Group     BP 07/19/16 0754 (!) 172/108     Pulse Rate 07/19/16 0754 (!) 107     Resp 07/19/16 0739 (!) 32     Temp --      Temp src --      SpO2 07/19/16 0754 100 %     Weight 07/19/16 0737 114 lb (51.7 kg)     Height 07/19/16 0737 4\' 11"  (1.499 m)     Head Circumference --      Peak Flow --  Pain Score 07/19/16 0738 10     Pain Loc --      Pain Edu? --      Excl. in GC? --    Oxygen saturation 95% on room air.  Vital signs reviewed, nursing assessments reviewed.   Constitutional:   Alert and oriented. Moderate respiratory distress. Eyes:   No scleral icterus. No conjunctival pallor. PERRL. EOMI.  No nystagmus. ENT   Head:   Normocephalic and atraumatic.   Nose:   No congestion/rhinnorhea. No septal hematoma   Mouth/Throat:   MMM, no pharyngeal erythema. No peritonsillar mass.    Neck:   No stridor. No SubQ emphysema. No meningismus. Hematological/Lymphatic/Immunilogical:   No cervical lymphadenopathy. Cardiovascular:   Tachycardia heart rate 110-120. Symmetric bilateral radial and DP pulses.  No murmurs.  Respiratory:   Increased work of breathing, tripoding on the stretcher. Prolonged expiratory phase, diffuse expiratory wheezing. No focal crackles. Good air entry in all lung fields. Gastrointestinal:   Soft and nontender. Non distended. There is no CVA tenderness.  No rebound, rigidity, or guarding. Genitourinary:   deferred Musculoskeletal:   Nontender with normal range of motion in all extremities. No joint effusions.  No lower extremity tenderness.  No edema. Neurologic:    Normal speech and language.  CN 2-10 normal. Motor grossly intact. No gross focal neurologic deficits are appreciated.  Skin:    Skin is warm, dry and intact. No rash noted.  No petechiae, purpura, or bullae.  ____________________________________________    LABS (pertinent positives/negatives) (all labs ordered are listed, but only abnormal results are displayed) Labs Reviewed  CBC WITH DIFFERENTIAL/PLATELET - Abnormal; Notable for the following:       Result Value   RBC 5.22 (*)    RDW 16.7 (*)    Monocytes Absolute 1.5 (*)    Basophils Absolute 0.2 (*)    All other components within normal limits  BLOOD GAS, VENOUS - Abnormal; Notable for the following:    Bicarbonate 29.1 (*)    Acid-Base Excess 3.4 (*)    All other components within normal limits  BASIC METABOLIC PANEL  TROPONIN I   ____________________________________________   EKG  Interpreted by me Sinus tachycardia rate 106, normal axis intervals. Poor R-wave progression in anterior precordial leads. Normal ST segments and T waves.  ____________________________________________    RADIOLOGY  Chest x-ray unremarkable  ____________________________________________   PROCEDURES Procedures  ____________________________________________   INITIAL IMPRESSION / ASSESSMENT AND PLAN / ED COURSE  Pertinent labs & imaging results that were available during my care of the patient were reviewed by me and considered in my medical decision making (see chart for details).  Patient presents with continued shortness of breath and chest pain despite recent outpatient treatment. Given the extensive workup she underwent just 3 days ago, I highly doubt that she has PE or dissection, and that this is highly likely to be a COPD exacerbation with outpatient treatment failure. Check labs chest x-ray, give bronchodilators and Solu-Medrol.  ----------------------------------------- 8:25 AM on  07/19/2016 -----------------------------------------  Patient not exhibiting the same distresses before, but is still very symptomatic. Given that she is already failed outpatient treatment, we'll plan to discuss with hospitalists for further management even though she is not hypoxic. VBG does not demonstrate any significant CO2 retention. I'll start her on oral Levaquin as well.  Clinical Course   ____________________________________________   FINAL CLINICAL IMPRESSION(S) / ED DIAGNOSES  Final diagnoses:  COPD exacerbation (HCC)       Portions of this  note were generated with dragon dictation software. Dictation errors may occur despite best attempts at proofreading.    Sharman CheekPhillip Jessaca Philippi, MD 07/19/16 0830

## 2016-07-19 NOTE — ED Triage Notes (Signed)
Pt presents with SOB and chest pain. Pt with labored breathing and difficulty speaking.

## 2016-07-19 NOTE — Progress Notes (Addendum)
Family Meeting Note  Advance Directive:no  Today a meeting took place with the patient  The following clinical team members were present during this meeting:MD  The following were discussed:Patient's diagnosis: COPD with acute hypoxic respiratory failure with diffuse wheezing on examination increased work of breathing High risk of intubation , Patient's progosis: > 12 months and Goals for treatment: Full Code  Additional follow-up to be provided: patient will be treated for COPD exacerbation  Time spent during discussion:16 minutes   Niel Peretti, MD

## 2016-07-19 NOTE — Progress Notes (Signed)
CH visited Pt, but couldn't talk to the Pt because she was just about to have her lunch. CH told the Pt that he will visit her later.     07/19/16 1400  Clinical Encounter Type  Visited With Patient  Visit Type Initial  Referral From Nurse  Consult/Referral To Chaplain  Spiritual Encounters  Spiritual Needs Prayer

## 2016-07-19 NOTE — Progress Notes (Signed)
Patient admitted to unit with COPD exacerbation. Pt with dyspnea. Oxygen given for comfort. Pt reports pain to chest from coughing. Oriented patient to room and unit. Admission assessment completed. Fluids and nutrition offered. Skin assessment completed and witnessed by Asher MuirJamie, RN. Pt has scattered bruising to legs and arms. No other complaints at this time. Pt resting comfortably at present. Resp therapy in for breathing treatment. Will continue to assess patient.

## 2016-07-20 LAB — BASIC METABOLIC PANEL
ANION GAP: 7 (ref 5–15)
BUN: 23 mg/dL — ABNORMAL HIGH (ref 6–20)
CO2: 29 mmol/L (ref 22–32)
Calcium: 8.9 mg/dL (ref 8.9–10.3)
Chloride: 103 mmol/L (ref 101–111)
Creatinine, Ser: 0.71 mg/dL (ref 0.44–1.00)
GLUCOSE: 163 mg/dL — AB (ref 65–99)
POTASSIUM: 3.8 mmol/L (ref 3.5–5.1)
SODIUM: 139 mmol/L (ref 135–145)

## 2016-07-20 LAB — CBC
HEMATOCRIT: 38.7 % (ref 35.0–47.0)
HEMOGLOBIN: 13 g/dL (ref 12.0–16.0)
MCH: 29.6 pg (ref 26.0–34.0)
MCHC: 33.6 g/dL (ref 32.0–36.0)
MCV: 88 fL (ref 80.0–100.0)
Platelets: 216 10*3/uL (ref 150–440)
RBC: 4.4 MIL/uL (ref 3.80–5.20)
RDW: 16 % — ABNORMAL HIGH (ref 11.5–14.5)
WBC: 7.6 10*3/uL (ref 3.6–11.0)

## 2016-07-20 MED ORDER — PREDNISONE 10 MG PO TABS: 10.0000 mg | ORAL_TABLET | Freq: Every day | ORAL | 0 refills | Status: DC

## 2016-07-20 MED ORDER — LEVOFLOXACIN 500 MG PO TABS: 500.0000 mg | ORAL_TABLET | Freq: Every day | ORAL | 0 refills | Status: DC

## 2016-07-20 MED ORDER — NICOTINE 21 MG/24HR TD PT24: 21.0000 mg | MEDICATED_PATCH | Freq: Every day | TRANSDERMAL | 0 refills | Status: DC

## 2016-07-20 MED ORDER — LORAZEPAM 1 MG PO TABS: 1.0000 mg | ORAL_TABLET | Freq: Two times a day (BID) | ORAL | 0 refills | Status: DC

## 2016-07-20 NOTE — Progress Notes (Signed)
SATURATION QUALIFICATIONS: (This note is used to comply with regulatory documentation for home oxygen)  Patient Saturations on Room Air at Rest = 96%  Patient Saturations on Room Air while Ambulating = 85%  Patient Saturations on 2 Liters of oxygen while Ambulating = 94%  Please briefly explain why patient needs home oxygen:  Chronic COPD, Asthma, CHF, Lung mass.  Saturation Qualifications obtained and provided by Reva BoresMalka, Charity fundraiserN to Conservation officer, naturethis writer.

## 2016-07-20 NOTE — Discharge Summary (Signed)
[employer] [facility] [place] [facility]   PATIENT NAME: Caitlyn Evans    MR#:  [id number]  DATE OF BIRTH:  October 08, 1965  DATE OF ADMISSION:  07/19/2016 ADMITTING PHYSICIAN: Adrian Saran, MD  DATE OF DISCHARGE: 07/20/2016  PRIMARY CARE PHYSICIAN: MASOUD,JAVED, MD    ADMISSION DIAGNOSIS:  COPD exacerbation (HCC) [J44.1]  DISCHARGE DIAGNOSIS:  Active Problems:   COPD (chronic obstructive pulmonary disease) (HCC)   SECONDARY DIAGNOSIS:   Past Medical History:  Diagnosis Date  . Anxiety    panic attacks  . Asthma   . BOOP (bronchiolitis obliterans with organizing pneumonia) (HCC)   . CHF (congestive heart failure) (HCC)   . Dysphagia, pharyngoesophageal phase 05/30/2015  . Emphysema lung (HCC) 05/30/2015  . Hypercholesterolemia   . Hypertension   . Lung mass   . Migraines   . Motion sickness    all moving vehicles  . Myocardial infarction 2012  . Seasonal allergies   . Shortness of breath dyspnea    1 flight-stairs    HOSPITAL COURSE:   50 year old female with a history of COPD not on oxygen and BOOP who presents with acute hypoxic respiratory failure.  1. Acute hypoxic respiratory failure in the setting of COPD exacerbation: Patient was treated with IV steroids, DuoNeb's and inhalers. She has done quite well with IV steroids. At discharge she has some minimal wheezing however she was ambulating around the floor without shortness of breath or hypoxic, however when she sat down at her bed she was 88% on room air. She will be discharged with home oxygen She will be discharged with prednisone taper and Levaquin for acute bronchitis/COPD exacerbation.  2. Anxiety: She ran out of her Xanax. I have refilled 16 pills and she'll follow-up with her primary care physician in one week.  3.BOOP: She will need outpatient follow-up with a pulmonologist.  4. GERD: Continue PPI    DISCHARGE CONDITIONS AND DIET:   Stable condition on regular diet  CONSULTS OBTAINED:     DRUG ALLERGIES:   Allergies  Allergen Reactions  . Penicillins Hives and Other (See Comments)    Has patient had a PCN reaction causing immediate rash, facial/tongue/throat swelling, SOB or lightheadedness with hypotension: No Has patient had a PCN reaction causing severe rash involving mucus membranes or skin necrosis: No Has patient had a PCN reaction that required hospitalization No Has patient had a PCN reaction occurring within the last 10 years: No If all of the above answers are "NO", then may proceed with Cephalosporin use.  . Codeine Hives and Nausea And Vomiting    DISCHARGE MEDICATIONS:   Current Discharge Medication List    START taking these medications   Details  levofloxacin (LEVAQUIN) 500 MG tablet Take 1 tablet (500 mg total) by mouth daily. Qty: 3 tablet, Refills: 0    nicotine (NICODERM CQ - DOSED IN MG/24 HOURS) 21 mg/24hr patch Place 1 patch (21 mg total) onto the skin daily. Qty: 28 patch, Refills: 0      CONTINUE these medications which have CHANGED   Details  LORazepam (ATIVAN) 1 MG tablet Take 1 tablet (1 mg total) by mouth 2 (two) times daily. Qty: 16 tablet, Refills: 0    predniSONE (DELTASONE) 10 MG tablet Take 1 tablet (10 mg total) by mouth daily with breakfast. 60 mg PO (ORAL) x 2 days 50 mg PO (ORAL)  x 2 days 40 mg PO (ORAL)  x 2 days 30 mg PO  (ORAL)  x 2 days 20  mg PO  (ORAL) x 2 days 10 mg PO  (ORAL) x 2 days then stop Qty: 42 tablet, Refills: 0      CONTINUE these medications which have NOT CHANGED   Details  amLODipine (NORVASC) 5 MG tablet Take 1 tablet (5 mg total) by mouth daily. Qty: 30 tablet, Refills: 2    ipratropium-albuterol (DUONEB) 0.5-2.5 (3) MG/3ML SOLN Inhale 3 mLs into the lungs every 4 (four) hours as needed (for wheezing/shortness of breath). Qty: 360 mL, Refills: 2    Multiple Vitamins-Minerals (MULTIVITAMIN GUMMIES ADULT) CHEW Chew 2 each by mouth daily.     pantoprazole (PROTONIX) 40 MG tablet Take 1  tablet (40 mg total) by mouth daily. Qty: 30 tablet, Refills: 2      STOP taking these medications     azithromycin (ZITHROMAX Z-PAK) 250 MG tablet      benzonatate (TESSALON PERLES) 100 MG capsule      chlorpheniramine-HYDROcodone (TUSSIONEX) 10-8 MG/5ML SUER      guaiFENesin (MUCINEX) 600 MG 12 hr tablet      mometasone-formoterol (DULERA) 200-5 MCG/ACT AERO      oxyCODONE-acetaminophen (PERCOCET) 7.5-325 MG tablet               Today   CHIEF COMPLAINT:  Patient is doing much better. She reports no shortness of breath or wheezing. She ambulated without feeling short of breath however was found to have hypoxia after ambulation   VITAL SIGNS:  Blood pressure 123/68, pulse 92, temperature 97.9 F (36.6 C), temperature source Oral, resp. rate 18, height 4\' 11"  (1.499 m), weight 50.5 kg (111 lb 6.4 oz), SpO2 92 %.   REVIEW OF SYSTEMS:  Review of Systems  Constitutional: Negative.  Negative for chills, fever and malaise/fatigue.  HENT: Negative.  Negative for ear discharge, ear pain, hearing loss, nosebleeds and sore throat.   Eyes: Negative.  Negative for blurred vision and pain.  Respiratory: Negative.  Negative for cough, hemoptysis, shortness of breath and wheezing.   Cardiovascular: Negative.  Negative for chest pain, palpitations and leg swelling.  Gastrointestinal: Negative.  Negative for abdominal pain, blood in stool, diarrhea, nausea and vomiting.  Genitourinary: Negative.  Negative for dysuria.  Musculoskeletal: Negative.  Negative for back pain.  Skin: Negative.   Neurological: Positive for tremors. Negative for dizziness, speech change, focal weakness, seizures and headaches.  Endo/Heme/Allergies: Negative.  Does not bruise/bleed easily.  Psychiatric/Behavioral: Negative for depression, hallucinations and suicidal ideas. The patient is nervous/anxious.      PHYSICAL EXAMINATION:  GENERAL:  50 y.o.-year-old patient lying in the bed with no acute  distress.  NECK:  Supple, no jugular venous distention. No thyroid enlargement, no tenderness.  LUNGS: Minimal bilateral wheezing with good air movement no rales,rhonchi  No use of accessory muscles of respiration.  CARDIOVASCULAR: S1, S2 normal. No murmurs, rubs, or gallops.  ABDOMEN: Soft, non-tender, non-distended. Bowel sounds present. No organomegaly or mass.  EXTREMITIES: No pedal edema, cyanosis, or clubbing.  PSYCHIATRIC: The patient is alert and oriented x 3.  SKIN: No obvious rash, lesion, or ulcer.   DATA REVIEW:   CBC  Recent Labs Lab 07/20/16 0248  WBC 7.6  HGB 13.0  HCT 38.7  PLT 216    Chemistries   Recent Labs Lab 07/16/16 1514  07/20/16 0248  NA 140  < > 139  K 4.0  < > 3.8  CL 107  < > 103  CO2 25  < > 29  GLUCOSE 93  < > 163*  BUN 21*  < > 23*  CREATININE 0.75  < > 0.71  CALCIUM 9.2  < > 8.9  AST 20  --   --   ALT 11*  --   --   ALKPHOS 61  --   --   BILITOT 0.6  --   --   < > = values in this interval not displayed.  Cardiac Enzymes  Recent Labs Lab 07/16/16 1514 07/19/16 0739  TROPONINI <0.03 <0.03    Microbiology Results  @[email or web address] @  RADIOLOGY:  Dg Chest Portable 1 View  Result Date: 07/19/2016 CLINICAL DATA:  Shortness of breath and chest pain EXAM: PORTABLE CHEST 1 VIEW COMPARISON:  July 16, 2016 chest radiograph and chest CT July 16, 2016 FINDINGS: The lungs are mildly hyperexpanded. There is mild scarring in the left base. There is also scarring in the right apex. There is no appreciable edema or consolidation. There is postoperative change in the left upper lobe. Heart size and pulmonary vascularity are within normal limits. There is atherosclerotic calcification in the aorta. No adenopathy. No bone lesions. No pneumothorax. IMPRESSION: Stable areas of lung scarring. No edema or consolidation. Postoperative change left upper lobe region. There is aortic atherosclerosis. Electronically Signed   By: Bretta BangWilliam  Woodruff III M.D.    On: 07/19/2016 08:07      Management plans discussed with the patient and she is in agreement. Stable for discharge   Patient should follow up with pcp  CODE STATUS:     Code Status Orders        Start     Ordered   07/19/16 1148  Full code  Continuous     07/19/16 1147    Code Status History    Date Active Date Inactive Code Status Order ID Comments User Context   05/09/2016  2:40 PM 05/13/2016  2:37 PM Full Code [id number]  Merwyn Katosavid B Simonds, MD ED   02/05/2016  7:14 PM 02/08/2016  2:52 PM Full Code [id number]  Ramonita LabAruna Gouru, MD Inpatient   12/05/2015 11:02 PM 12/09/2015  3:35 PM Full Code [id number]  Oralia Manisavid Willis, MD Inpatient    Advance Directive Documentation   Flowsheet Row Most Recent Value  Type of Advance Directive  Out of facility DNR (pink MOST or yellow form)  Pre-existing out of facility DNR order (yellow form or pink MOST form)  No data  "MOST" Form in Place?  No data      TOTAL TIME TAKING CARE OF THIS PATIENT: 36 minutes.    Note: This dictation was prepared with Dragon dictation along with smaller phrase technology. Any transcriptional errors that result from this process are unintentional.  Jada Fass M.D on 07/20/2016 at 10:23 AM  Between 7am to 6pm - Pager - [phone] After 6pm go to [email or web address] - Social research officer, governmentpassword [email or web address]  [facility]  Office  [phone]  CC: Primary care physician; Corky DownsMASOUD,JAVED, MD

## 2016-07-20 NOTE — Evaluation (Signed)
Physical Therapy Evaluation Patient Details Name: Caitlyn Evans MRN: [id number] DOB: 06-12-1966 Today's Date: 07/20/2016   History of Present Illness  50 yo female with onset of SOB and onset CAP, has PMHx:  BOOP, MI, lung mass, CHF, acute respiratory failure,   Clinical Impression  Pt was able to walk with O2 off and no LOB but numbers were as follow:  SATURATION QUALIFICATIONS: (This note is used to comply with regulatory documentation for home oxygen)  Patient Saturations on Room Air at Rest = 96%  Patient Saturations on Room Air while Ambulating = 85%, 95%  Patient Saturations on 2 Liters of oxygen while Ambulating = 95%  Please briefly explain why patient needs home oxygen:  Pt has to control walk to very slow pace to maintain sats with O2 removed.  Her plan is to continue with walking at home on no O2 so may not agree to stay at home with it.  Will not need any further therapy so discharge PT for now.    Follow Up Recommendations No PT follow up    Equipment Recommendations  None recommended by PT    Recommendations for Other Services       Precautions / Restrictions Precautions Precautions: Fall (telemetry) Restrictions Weight Bearing Restrictions: No      Mobility  Bed Mobility Overal bed mobility: Modified Independent                Transfers Overall transfer level: Modified independent Equipment used: 1 person hand held assist (min guard for safety)             General transfer comment: using the bed or rail to stand up with security  Ambulation/Gait Ambulation/Gait assistance: Min guard Ambulation Distance (Feet): 150 Feet (x 3) Assistive device: 1 person hand held assist (min guard) Gait Pattern/deviations: Decreased stride length;Step-through pattern;Trunk flexed;Narrow base of support;Drifts right/left Gait velocity: reduced Gait velocity interpretation: Below normal speed for age/gender General Gait Details: safe turns with controlled  speed  Stairs            Wheelchair Mobility    Modified Rankin (Stroke Patients Only)       Balance Overall balance assessment: Needs assistance Sitting-balance support: Feet supported Sitting balance-Leahy Scale: Good     Standing balance support: No upper extremity supported Standing balance-Leahy Scale: Fair                               Pertinent Vitals/Pain Pain Assessment: No/denies pain    Home Living Family/patient expects to be discharged to:: Private residence Living Arrangements: Spouse/significant other;Other relatives Available Help at Discharge: Family Type of Home: House Home Access: Stairs to enter Entrance Stairs-Rails: Can reach both Entrance Stairs-Number of Steps: 3 Home Layout: One level Home Equipment: None      Prior Function Level of Independence: Independent         Comments: reports no previous use of O2 chronically at home     Hand Dominance   Dominant Hand: Right    Extremity/Trunk Assessment   Upper Extremity Assessment: Overall WFL for tasks assessed           Lower Extremity Assessment: Overall WFL for tasks assessed      Cervical / Trunk Assessment: Normal  Communication   Communication: No difficulties  Cognition Arousal/Alertness: Lethargic Behavior During Therapy: Flat affect Overall Cognitive Status: Within Functional Limits for tasks assessed  General Comments General comments (skin integrity, edema, etc.): Pt is somewhat low in endurance but at PLOF from that standpoint    Exercises     Assessment/Plan    PT Assessment Patent does not need any further PT services  PT Problem List Cardiopulmonary status limiting activity          PT Treatment Interventions      PT Goals (Current goals can be found in the Care Plan section)  Acute Rehab PT Goals Patient Stated Goal: to avoid using a walker and O2 PT Goal Formulation: With patient/family Time For  Goal Achievement: 07/20/16    Frequency     Barriers to discharge Other (comment) (pt has family at home to assist)      Co-evaluation               End of Session Equipment Utilized During Treatment: Gait belt;Other (comment) (removed O2 to assess sats and pulse) Activity Tolerance: Patient limited by fatigue;Other (comment) (limited by vitals on first walk) Patient left: in bed;with call bell/phone within reach;with family/visitor present;with nursing/sitter in room Nurse Communication: Mobility status;Other (comment) (O2 sats with all mobility)         Time: [id number] PT Time Calculation (min) (ACUTE ONLY): 24 min   Charges:   PT Evaluation $PT Eval Moderate Complexity: 1 Procedure PT Treatments $Gait Training: 8-22 mins   PT G Codes:        Ivar DrapeStout, Thalia Turkington E 07/20/2016, 1:30 PM    Samul Dadauth Chastity Noland, PT MS Acute Rehab Dept. Number: [facility] [phone] and [facility] [phone]

## 2016-07-20 NOTE — Progress Notes (Signed)
Pt has been discharged home. O2 tank given. Pt refuses to applied Banks and verbalized that she needs it only for ambulation. O2 sats 95% on RA.  Discharge papers given and explained to pt. Pt verbalized understanding. meds and f/u appointment reviewed with pt. RX given. Pt escorted in a wheelchair.

## 2016-07-24 DIAGNOSIS — K222 Esophageal obstruction: Secondary | ICD-10-CM | POA: Diagnosis not present

## 2016-07-24 DIAGNOSIS — I119 Hypertensive heart disease without heart failure: Secondary | ICD-10-CM | POA: Diagnosis not present

## 2016-07-24 DIAGNOSIS — J449 Chronic obstructive pulmonary disease, unspecified: Secondary | ICD-10-CM | POA: Diagnosis not present

## 2016-07-24 DIAGNOSIS — J42 Unspecified chronic bronchitis: Secondary | ICD-10-CM | POA: Diagnosis not present

## 2016-08-06 DIAGNOSIS — B0221 Postherpetic geniculate ganglionitis: Secondary | ICD-10-CM | POA: Diagnosis not present

## 2016-08-06 DIAGNOSIS — G43919 Migraine, unspecified, intractable, without status migrainosus: Secondary | ICD-10-CM | POA: Diagnosis not present

## 2016-08-06 DIAGNOSIS — J9610 Chronic respiratory failure, unspecified whether with hypoxia or hypercapnia: Secondary | ICD-10-CM | POA: Diagnosis not present

## 2016-08-06 DIAGNOSIS — K222 Esophageal obstruction: Secondary | ICD-10-CM | POA: Diagnosis not present

## 2016-08-12 DIAGNOSIS — J42 Unspecified chronic bronchitis: Secondary | ICD-10-CM | POA: Diagnosis not present

## 2016-08-12 DIAGNOSIS — B0223 Postherpetic polyneuropathy: Secondary | ICD-10-CM | POA: Diagnosis not present

## 2016-08-12 DIAGNOSIS — I43 Cardiomyopathy in diseases classified elsewhere: Secondary | ICD-10-CM | POA: Diagnosis not present

## 2016-08-12 DIAGNOSIS — I119 Hypertensive heart disease without heart failure: Secondary | ICD-10-CM | POA: Diagnosis not present

## 2016-08-19 DIAGNOSIS — J441 Chronic obstructive pulmonary disease with (acute) exacerbation: Secondary | ICD-10-CM | POA: Diagnosis not present

## 2016-09-19 DIAGNOSIS — J441 Chronic obstructive pulmonary disease with (acute) exacerbation: Secondary | ICD-10-CM | POA: Diagnosis not present

## 2016-09-27 DIAGNOSIS — I43 Cardiomyopathy in diseases classified elsewhere: Secondary | ICD-10-CM | POA: Diagnosis not present

## 2016-09-27 DIAGNOSIS — I119 Hypertensive heart disease without heart failure: Secondary | ICD-10-CM | POA: Diagnosis not present

## 2016-09-27 DIAGNOSIS — K2900 Acute gastritis without bleeding: Secondary | ICD-10-CM | POA: Diagnosis not present

## 2016-09-27 DIAGNOSIS — J42 Unspecified chronic bronchitis: Secondary | ICD-10-CM | POA: Diagnosis not present

## 2016-10-08 DIAGNOSIS — K2900 Acute gastritis without bleeding: Secondary | ICD-10-CM | POA: Diagnosis not present

## 2016-10-08 DIAGNOSIS — J439 Emphysema, unspecified: Secondary | ICD-10-CM | POA: Diagnosis not present

## 2016-10-08 DIAGNOSIS — J42 Unspecified chronic bronchitis: Secondary | ICD-10-CM | POA: Diagnosis not present

## 2016-10-20 DIAGNOSIS — J441 Chronic obstructive pulmonary disease with (acute) exacerbation: Secondary | ICD-10-CM | POA: Diagnosis not present

## 2016-11-11 DIAGNOSIS — J218 Acute bronchiolitis due to other specified organisms: Secondary | ICD-10-CM | POA: Diagnosis not present

## 2016-11-11 DIAGNOSIS — J441 Chronic obstructive pulmonary disease with (acute) exacerbation: Secondary | ICD-10-CM | POA: Diagnosis not present

## 2016-11-11 DIAGNOSIS — J449 Chronic obstructive pulmonary disease, unspecified: Secondary | ICD-10-CM | POA: Diagnosis not present

## 2016-11-11 DIAGNOSIS — J208 Acute bronchitis due to other specified organisms: Secondary | ICD-10-CM | POA: Diagnosis not present

## 2016-11-17 DIAGNOSIS — J441 Chronic obstructive pulmonary disease with (acute) exacerbation: Secondary | ICD-10-CM | POA: Diagnosis not present

## 2016-11-27 DIAGNOSIS — K222 Esophageal obstruction: Secondary | ICD-10-CM | POA: Diagnosis not present

## 2016-11-27 DIAGNOSIS — J439 Emphysema, unspecified: Secondary | ICD-10-CM | POA: Diagnosis not present

## 2016-11-27 DIAGNOSIS — J42 Unspecified chronic bronchitis: Secondary | ICD-10-CM | POA: Diagnosis not present

## 2016-11-27 DIAGNOSIS — J449 Chronic obstructive pulmonary disease, unspecified: Secondary | ICD-10-CM | POA: Diagnosis not present

## 2016-12-18 DIAGNOSIS — J441 Chronic obstructive pulmonary disease with (acute) exacerbation: Secondary | ICD-10-CM | POA: Diagnosis not present

## 2016-12-25 DIAGNOSIS — J449 Chronic obstructive pulmonary disease, unspecified: Secondary | ICD-10-CM | POA: Diagnosis not present

## 2016-12-25 DIAGNOSIS — J439 Emphysema, unspecified: Secondary | ICD-10-CM | POA: Diagnosis not present

## 2017-01-17 DIAGNOSIS — J441 Chronic obstructive pulmonary disease with (acute) exacerbation: Secondary | ICD-10-CM | POA: Diagnosis not present

## 2017-01-24 DIAGNOSIS — I119 Hypertensive heart disease without heart failure: Secondary | ICD-10-CM | POA: Diagnosis not present

## 2017-01-24 DIAGNOSIS — K222 Esophageal obstruction: Secondary | ICD-10-CM | POA: Diagnosis not present

## 2017-01-24 DIAGNOSIS — K2900 Acute gastritis without bleeding: Secondary | ICD-10-CM | POA: Diagnosis not present

## 2017-01-24 DIAGNOSIS — J42 Unspecified chronic bronchitis: Secondary | ICD-10-CM | POA: Diagnosis not present

## 2017-04-11 ENCOUNTER — Other Ambulatory Visit: Payer: Self-pay | Admitting: Specialist

## 2017-04-11 DIAGNOSIS — J449 Chronic obstructive pulmonary disease, unspecified: Secondary | ICD-10-CM

## 2017-04-11 DIAGNOSIS — R0602 Shortness of breath: Secondary | ICD-10-CM

## 2017-04-15 ENCOUNTER — Ambulatory Visit
Admission: RE | Admit: 2017-04-15 | Discharge: 2017-04-15 | Disposition: A | Discharge status: Home / Self Care | Payer: Medicare Other | Source: Ambulatory Visit | Attending: Specialist | Admitting: Specialist

## 2017-04-15 DIAGNOSIS — J449 Chronic obstructive pulmonary disease, unspecified: Secondary | ICD-10-CM | POA: Diagnosis not present

## 2017-04-15 DIAGNOSIS — I700 Atherosclerosis of aorta: Secondary | ICD-10-CM | POA: Insufficient documentation

## 2017-04-15 DIAGNOSIS — R0602 Shortness of breath: Secondary | ICD-10-CM | POA: Diagnosis not present

## 2017-04-15 DIAGNOSIS — R918 Other nonspecific abnormal finding of lung field: Secondary | ICD-10-CM | POA: Insufficient documentation

## 2017-04-15 DIAGNOSIS — J984 Other disorders of lung: Secondary | ICD-10-CM | POA: Diagnosis not present

## 2017-04-19 DIAGNOSIS — J441 Chronic obstructive pulmonary disease with (acute) exacerbation: Secondary | ICD-10-CM | POA: Diagnosis not present

## 2017-05-13 DIAGNOSIS — J9610 Chronic respiratory failure, unspecified whether with hypoxia or hypercapnia: Secondary | ICD-10-CM | POA: Diagnosis not present

## 2017-05-13 DIAGNOSIS — J439 Emphysema, unspecified: Secondary | ICD-10-CM | POA: Diagnosis not present

## 2017-05-13 DIAGNOSIS — G43919 Migraine, unspecified, intractable, without status migrainosus: Secondary | ICD-10-CM | POA: Diagnosis not present

## 2017-05-15 DIAGNOSIS — R918 Other nonspecific abnormal finding of lung field: Secondary | ICD-10-CM | POA: Diagnosis not present

## 2017-05-15 DIAGNOSIS — J439 Emphysema, unspecified: Secondary | ICD-10-CM | POA: Diagnosis not present

## 2017-05-15 DIAGNOSIS — R05 Cough: Secondary | ICD-10-CM | POA: Diagnosis not present

## 2017-05-15 DIAGNOSIS — R0609 Other forms of dyspnea: Secondary | ICD-10-CM | POA: Diagnosis not present

## 2017-05-20 DIAGNOSIS — J441 Chronic obstructive pulmonary disease with (acute) exacerbation: Secondary | ICD-10-CM | POA: Diagnosis not present

## 2017-06-19 DIAGNOSIS — J441 Chronic obstructive pulmonary disease with (acute) exacerbation: Secondary | ICD-10-CM | POA: Diagnosis not present

## 2017-06-23 ENCOUNTER — Encounter: Payer: Self-pay | Admitting: Emergency Medicine

## 2017-06-23 ENCOUNTER — Emergency Department
Admission: EM | Admit: 2017-06-23 | Discharge: 2017-06-23 | Disposition: A | Discharge status: Home / Self Care | Payer: Medicare Other | Attending: Emergency Medicine | Admitting: Emergency Medicine

## 2017-06-23 ENCOUNTER — Emergency Department: Payer: Medicare Other

## 2017-06-23 DIAGNOSIS — I252 Old myocardial infarction: Secondary | ICD-10-CM | POA: Diagnosis not present

## 2017-06-23 DIAGNOSIS — I110 Hypertensive heart disease with heart failure: Secondary | ICD-10-CM | POA: Insufficient documentation

## 2017-06-23 DIAGNOSIS — J45909 Unspecified asthma, uncomplicated: Secondary | ICD-10-CM | POA: Insufficient documentation

## 2017-06-23 DIAGNOSIS — G43919 Migraine, unspecified, intractable, without status migrainosus: Secondary | ICD-10-CM | POA: Diagnosis not present

## 2017-06-23 DIAGNOSIS — J441 Chronic obstructive pulmonary disease with (acute) exacerbation: Secondary | ICD-10-CM

## 2017-06-23 DIAGNOSIS — I509 Heart failure, unspecified: Secondary | ICD-10-CM | POA: Insufficient documentation

## 2017-06-23 DIAGNOSIS — R0602 Shortness of breath: Secondary | ICD-10-CM | POA: Diagnosis not present

## 2017-06-23 DIAGNOSIS — J9610 Chronic respiratory failure, unspecified whether with hypoxia or hypercapnia: Secondary | ICD-10-CM | POA: Diagnosis not present

## 2017-06-23 DIAGNOSIS — F17200 Nicotine dependence, unspecified, uncomplicated: Secondary | ICD-10-CM | POA: Diagnosis not present

## 2017-06-23 DIAGNOSIS — J219 Acute bronchiolitis, unspecified: Secondary | ICD-10-CM | POA: Diagnosis not present

## 2017-06-23 LAB — BASIC METABOLIC PANEL
Anion gap: 9 (ref 5–15)
BUN: 20 mg/dL (ref 6–20)
CALCIUM: 9 mg/dL (ref 8.9–10.3)
CHLORIDE: 110 mmol/L (ref 101–111)
CO2: 25 mmol/L (ref 22–32)
Creatinine, Ser: 0.85 mg/dL (ref 0.44–1.00)
GFR calc non Af Amer: >60 mL/min (ref >60)
Glucose, Bld: 86 mg/dL (ref 65–99)
Potassium: 3.7 mmol/L (ref 3.5–5.1)
Sodium: 144 mmol/L (ref 135–145)

## 2017-06-23 LAB — CBC
HCT: 40.7 % (ref 35.0–47.0)
HEMOGLOBIN: 13.8 g/dL (ref 12.0–16.0)
MCH: 29.9 pg (ref 26.0–34.0)
MCHC: 33.8 g/dL (ref 32.0–36.0)
MCV: 88.5 fL (ref 80.0–100.0)
PLATELETS: 267 10*3/uL (ref 150–440)
RBC: 4.6 MIL/uL (ref 3.80–5.20)
RDW: 16 % — ABNORMAL HIGH (ref 11.5–14.5)
WBC: 9.7 10*3/uL (ref 3.6–11.0)

## 2017-06-23 MED ORDER — IPRATROPIUM-ALBUTEROL 0.5-2.5 (3) MG/3ML IN SOLN
3.0000 mL | Freq: Once | RESPIRATORY_TRACT | Status: AC
  Administered 2017-06-23: 3 mL via RESPIRATORY_TRACT

## 2017-06-23 MED ORDER — PREDNISONE 10 MG (21) PO TBPK: ORAL_TABLET | Freq: Every day | ORAL | 0 refills | Status: DC

## 2017-06-23 MED ORDER — IPRATROPIUM-ALBUTEROL 0.5-2.5 (3) MG/3ML IN SOLN
RESPIRATORY_TRACT | Status: AC
  Filled 2017-06-23: qty 9

## 2017-06-23 MED ORDER — LEVOFLOXACIN 500 MG PO TABS: 500.0000 mg | ORAL_TABLET | Freq: Every day | ORAL | 0 refills | Status: DC

## 2017-06-23 MED ORDER — LEVOFLOXACIN 750 MG PO TABS
ORAL_TABLET | ORAL | Status: AC
  Administered 2017-06-23: 750 mg
  Filled 2017-06-23: qty 1

## 2017-06-23 MED ORDER — METHYLPREDNISOLONE SODIUM SUCC 125 MG IJ SOLR
125.0000 mg | Freq: Once | INTRAMUSCULAR | Status: AC
  Administered 2017-06-23: 125 mg via INTRAVENOUS

## 2017-06-23 MED ORDER — METHYLPREDNISOLONE SODIUM SUCC 125 MG IJ SOLR
INTRAMUSCULAR | Status: AC
  Filled 2017-06-23: qty 2

## 2017-06-23 MED ORDER — LORAZEPAM 2 MG/ML IJ SOLN
1.0000 mg | Freq: Once | INTRAMUSCULAR | Status: AC
  Administered 2017-06-23: 1 mg via INTRAVENOUS

## 2017-06-23 MED ORDER — LORAZEPAM 2 MG/ML IJ SOLN
INTRAMUSCULAR | Status: AC
  Administered 2017-06-23: 1 mg via INTRAVENOUS
  Filled 2017-06-23: qty 1

## 2017-06-23 NOTE — ED Triage Notes (Signed)
Sent her eby dr Juel Burrow for sob.  She normally takes daily prednisone and he gave her an extra 60 mg at the office.  Normally on oxygen at home 2 liters.

## 2017-06-23 NOTE — ED Notes (Signed)
Respirations less labored and dyspnea at rest resolving.

## 2017-06-23 NOTE — ED Notes (Signed)
Patient denies pain and is resting comfortably.  

## 2017-06-23 NOTE — ED Provider Notes (Signed)
[facility] Emergency Department Provider Note       Time seen: ----------------------------------------- 1:34 PM on 06/23/2017 -----------------------------------------     I have reviewed the triage vital signs and the nursing notes.   HISTORY   Chief Complaint Shortness of Breath    HPI Caitlyn Evans is a 51 y.o. female with a history of CHF, COPD and bronchiolitis obliterans who presents to the ED for shortness of breath. Patient was sent here by her primary care doctor Dr. Juel Burrow for shortness of breath. Patient normally takes daily prednisone and he gave her an extra 60 mg at the office today. She is currently on 2 L of oxygen at home. she states she took 2 breathing treatments prior to arrival without significant improvement in her symptoms. She denies recent illness or other complaints.  Past Medical History:  Diagnosis Date  . Anxiety    panic attacks  . Asthma   . BOOP (bronchiolitis obliterans with organizing pneumonia) (HCC)   . CHF (congestive heart failure) (HCC)   . Dysphagia, pharyngoesophageal phase 05/30/2015  . Emphysema lung (HCC) 05/30/2015  . Hypercholesterolemia   . Hypertension   . Lung mass   . Migraines   . Motion sickness    all moving vehicles  . Myocardial infarction (HCC) 2012  . Seasonal allergies   . Shortness of breath dyspnea    1 flight-stairs    Patient Active Problem List   Diagnosis Date Noted  . COPD (chronic obstructive pulmonary disease) (HCC) 07/19/2016  . Acute on chronic respiratory failure (HCC) 05/09/2016  . Noncompliance with medication regimen 05/09/2016  . COPD with acute exacerbation (HCC) 02/05/2016  . Acute exacerbation of chronic obstructive pulmonary disease (COPD) (HCC)   . Acute respiratory failure (HCC)   . Acute respiratory failure with hypoxia (HCC) 12/05/2015  . BOOP (bronchiolitis obliterans with organizing pneumonia) (HCC) 12/05/2015  . Depression, major, single episode,  moderate (HCC) 07/14/2015  . Adjustment disorder with anxiety 07/14/2015  . COPD exacerbation (HCC) 07/14/2015  . Special screening for malignant neoplasms, colon   . Swallowing difficulty   . Loss of weight   . Esophagogastric ulcer   . Esophageal ulcer   . Hypertension 05/30/2015  . Emphysema lung (HCC) 05/30/2015  . Dysphagia, pharyngoesophageal phase 05/30/2015    Past Surgical History:  Procedure Laterality Date  . COLONOSCOPY WITH PROPOFOL N/A 06/02/2015   Procedure: COLONOSCOPY WITH PROPOFOL;  Surgeon: Midge Minium, MD;  Location: [facility];  Service: Endoscopy;  Laterality: N/A;  . ESOPHAGOGASTRODUODENOSCOPY (EGD) WITH PROPOFOL N/A 06/02/2015   Procedure: ESOPHAGOGASTRODUODENOSCOPY (EGD) WITH PROPOFOL withdialation;  Surgeon: Midge Minium, MD;  Location[facility];  Service: Endoscopy;  Laterality: N/A;  . LUNG SURGERY Left    Upper lobe removed  . OOPHORECTOMY Left   . VAGINAL HYSTERECTOMY      Allergies Penicillins and Codeine  Social History Social History  Substance Use Topics  . Smoking status: Current Every Day Smoker    Packs/day: 1.00    Years: 30.00  . Smokeless tobacco: Never Used     Comment: quit some time in 2012  . Alcohol use No     Comment: rare consumption    Review of Systems Constitutional: Negative for fever. Cardiovascular: Negative for chest pain. Respiratory: positive for shortness of breath and cough Gastrointestinal: Negative for abdominal pain, vomiting and diarrhea. Genitourinary: Negative for dysuria. Musculoskeletal: Negative for back pain. Skin: Negative for rash. Neurological: Negative for headaches, focal weakness or numbness.  All systems negative/normal/unremarkable except as stated in the HPI  ____________________________________________   PHYSICAL EXAM:  VITAL SIGNS: ED Triage Vitals  Enc Vitals Group     BP 06/23/17 1159 (!) 124/58     Pulse Rate 06/23/17 1159 (!) 105     Resp 06/23/17 1159 (!)  26     Temp 06/23/17 1159 98 F (36.7 C)     Temp Source 06/23/17 1159 Oral     SpO2 06/23/17 1159 96 %     Weight 06/23/17 1200 112 lb (50.8 kg)     Height 06/23/17 1200  (1.473 m)     Head Circumference --      Peak Flow --      Pain Score 06/23/17 1158 8     Pain Loc --      Pain Edu? --      Excl. in GC? --    Constitutional: Alert and oriented. mild distress Eyes: Conjunctivae are normal. Normal extraocular movements. ENT   Head: Normocephalic and atraumatic.   Nose: No congestion/rhinnorhea.   Mouth/Throat: Mucous membranes are moist.   Neck: No stridor. Cardiovascular: rapid rate, regular rhythm. No murmurs, rubs, or gallops. Respiratory: tachypnea with wheezing and rhonchi bilaterally Gastrointestinal: Soft and nontender. Normal bowel sounds Musculoskeletal: Nontender with normal range of motion in extremities. No lower extremity tenderness nor edema. Neurologic:  Normal speech and language. No gross focal neurologic deficits are appreciated.  Skin:  Skin is warm, dry and intact. No rash noted. Psychiatric: Mood and affect are normal. Speech and behavior are normal.  ____________________________________________  EKG: Interpreted by me.sinus rhythm throughout 100 bpm, normal PR interval, normal QRS, normal QT.  ____________________________________________  ED COURSE:  Pertinent labs & imaging results that were available during my care of the patient were reviewed by me and considered in my medical decision making (see chart for details). Patient presents for shortness of breath, we will assess with labs and imaging as indicated. Clinical Course as of Jun 23 1441  Mon Jun 23, 2017  1420 patient's lungs sound mostly improved and are clearing.  [JW]    Clinical Course User Index [JW] Emily Filbert, MD   Procedures ____________________________________________   LABS (pertinent positives/negatives)  Labs Reviewed  CBC - Abnormal; Notable  for the following:       Result Value   RDW 16.0 (*)    All other components within normal limits  BASIC METABOLIC PANEL    RADIOLOGY Images were viewed by me  chest x-ray IMPRESSION: Chronic bronchitic changes, stable. No acute pneumonia nor other acute cardiopulmonary abnormality. ____________________________________________  DIFFERENTIAL DIAGNOSIS   COPD exacerbation, pneumonia, bronchiolitis obliterans, PE, pneumothorax   FINAL ASSESSMENT AND PLAN  COPD exacerbation   Plan: Patient had presented for shortness of breath and cough. Patients labs reassuring. Patients imaging did not reveal any focal pneumonia or infectious process. She has received instructions to receive antibiotics by her primary care doctor. I will place her on a course of Levaquin and encouraged her to take steroids as directed and continue her inhalers. Overall she is improved and stable for outpatient follow-up.   Emily Filbert, MD   Note: This note was generated in part or whole with voice recognition software. Voice recognition is usually quite accurate but there are transcription errors that can and very often do occur. I apologize for any typographical errors that were not detected and corrected.     Emily Filbert, MD 06/23/17 [id number]

## 2017-06-23 NOTE — ED Notes (Signed)
AAOx3.  Skin warm and dry.  NAD 

## 2017-07-01 ENCOUNTER — Emergency Department: Payer: Medicare Other

## 2017-07-01 ENCOUNTER — Inpatient Hospital Stay
Admission: EM | Admit: 2017-07-01 | Discharge: 2017-07-04 | DRG: 208 | Disposition: A | Discharge status: Home / Self Care | Payer: Medicare Other | Attending: Internal Medicine | Admitting: Internal Medicine

## 2017-07-01 ENCOUNTER — Inpatient Hospital Stay: Payer: Medicare Other

## 2017-07-01 DIAGNOSIS — Z823 Family history of stroke: Secondary | ICD-10-CM

## 2017-07-01 DIAGNOSIS — J9620 Acute and chronic respiratory failure, unspecified whether with hypoxia or hypercapnia: Secondary | ICD-10-CM | POA: Diagnosis not present

## 2017-07-01 DIAGNOSIS — Z888 Allergy status to other drugs, medicaments and biological substances status: Secondary | ICD-10-CM

## 2017-07-01 DIAGNOSIS — F17210 Nicotine dependence, cigarettes, uncomplicated: Secondary | ICD-10-CM | POA: Diagnosis present

## 2017-07-01 DIAGNOSIS — Z90721 Acquired absence of ovaries, unilateral: Secondary | ICD-10-CM | POA: Diagnosis not present

## 2017-07-01 DIAGNOSIS — Z0189 Encounter for other specified special examinations: Secondary | ICD-10-CM

## 2017-07-01 DIAGNOSIS — J441 Chronic obstructive pulmonary disease with (acute) exacerbation: Secondary | ICD-10-CM | POA: Diagnosis present

## 2017-07-01 DIAGNOSIS — Z880 Allergy status to penicillin: Secondary | ICD-10-CM

## 2017-07-01 DIAGNOSIS — J9601 Acute respiratory failure with hypoxia: Secondary | ICD-10-CM | POA: Diagnosis not present

## 2017-07-01 DIAGNOSIS — Z8249 Family history of ischemic heart disease and other diseases of the circulatory system: Secondary | ICD-10-CM

## 2017-07-01 DIAGNOSIS — I252 Old myocardial infarction: Secondary | ICD-10-CM | POA: Diagnosis not present

## 2017-07-01 DIAGNOSIS — Z9889 Other specified postprocedural states: Secondary | ICD-10-CM | POA: Diagnosis not present

## 2017-07-01 DIAGNOSIS — R0602 Shortness of breath: Secondary | ICD-10-CM | POA: Diagnosis not present

## 2017-07-01 DIAGNOSIS — J209 Acute bronchitis, unspecified: Secondary | ICD-10-CM | POA: Diagnosis present

## 2017-07-01 DIAGNOSIS — J9602 Acute respiratory failure with hypercapnia: Secondary | ICD-10-CM | POA: Diagnosis not present

## 2017-07-01 DIAGNOSIS — Z90710 Acquired absence of both cervix and uterus: Secondary | ICD-10-CM | POA: Diagnosis not present

## 2017-07-01 DIAGNOSIS — J440 Chronic obstructive pulmonary disease with acute lower respiratory infection: Secondary | ICD-10-CM | POA: Diagnosis not present

## 2017-07-01 DIAGNOSIS — Z825 Family history of asthma and other chronic lower respiratory diseases: Secondary | ICD-10-CM

## 2017-07-01 DIAGNOSIS — J9622 Acute and chronic respiratory failure with hypercapnia: Secondary | ICD-10-CM | POA: Diagnosis not present

## 2017-07-01 DIAGNOSIS — Z79899 Other long term (current) drug therapy: Secondary | ICD-10-CM | POA: Diagnosis not present

## 2017-07-01 DIAGNOSIS — I509 Heart failure, unspecified: Secondary | ICD-10-CM | POA: Diagnosis present

## 2017-07-01 DIAGNOSIS — Z4682 Encounter for fitting and adjustment of non-vascular catheter: Secondary | ICD-10-CM | POA: Diagnosis not present

## 2017-07-01 DIAGNOSIS — I110 Hypertensive heart disease with heart failure: Secondary | ICD-10-CM | POA: Diagnosis present

## 2017-07-01 DIAGNOSIS — J9621 Acute and chronic respiratory failure with hypoxia: Secondary | ICD-10-CM | POA: Diagnosis not present

## 2017-07-01 DIAGNOSIS — J9600 Acute respiratory failure, unspecified whether with hypoxia or hypercapnia: Secondary | ICD-10-CM | POA: Diagnosis present

## 2017-07-01 DIAGNOSIS — F17200 Nicotine dependence, unspecified, uncomplicated: Secondary | ICD-10-CM | POA: Diagnosis not present

## 2017-07-01 LAB — CBC WITH DIFFERENTIAL/PLATELET
BASOS PCT: 0 %
Basophils Absolute: 0 10*3/uL (ref 0–0.1)
EOS ABS: 0 10*3/uL (ref 0–0.7)
EOS PCT: 0 %
HCT: 44.2 % (ref 35.0–47.0)
Hemoglobin: 14.5 g/dL (ref 12.0–16.0)
LYMPHS ABS: 0.9 10*3/uL — AB (ref 1.0–3.6)
Lymphocytes Relative: 9 %
MCH: 29.7 pg (ref 26.0–34.0)
MCHC: 32.9 g/dL (ref 32.0–36.0)
MCV: 90.5 fL (ref 80.0–100.0)
Monocytes Absolute: 1 10*3/uL — ABNORMAL HIGH (ref 0.2–0.9)
Monocytes Relative: 10 %
Neutro Abs: 8.2 10*3/uL — ABNORMAL HIGH (ref 1.4–6.5)
Neutrophils Relative %: 81 %
PLATELETS: 271 10*3/uL (ref 150–440)
RBC: 4.88 MIL/uL (ref 3.80–5.20)
RDW: 16.5 % — ABNORMAL HIGH (ref 11.5–14.5)
WBC: 10.1 10*3/uL (ref 3.6–11.0)

## 2017-07-01 LAB — URINALYSIS, COMPLETE (UACMP) WITH MICROSCOPIC
BILIRUBIN URINE: NEGATIVE
Bacteria, UA: NONE SEEN
Glucose, UA: NEGATIVE mg/dL
Ketones, ur: NEGATIVE mg/dL
LEUKOCYTES UA: NEGATIVE
Nitrite: NEGATIVE
PH: 6 (ref 5.0–8.0)
Protein, ur: NEGATIVE mg/dL
SPECIFIC GRAVITY, URINE: 1.01 (ref 1.005–1.030)

## 2017-07-01 LAB — TROPONIN I

## 2017-07-01 LAB — BASIC METABOLIC PANEL
Anion gap: 14 (ref 5–15)
BUN: 17 mg/dL (ref 6–20)
CALCIUM: 9 mg/dL (ref 8.9–10.3)
CO2: 27 mmol/L (ref 22–32)
CREATININE: 0.65 mg/dL (ref 0.44–1.00)
Chloride: 103 mmol/L (ref 101–111)
Glucose, Bld: 101 mg/dL — ABNORMAL HIGH (ref 65–99)
Potassium: 4.3 mmol/L (ref 3.5–5.1)
SODIUM: 144 mmol/L (ref 135–145)

## 2017-07-01 LAB — GLUCOSE, CAPILLARY: Glucose-Capillary: 117 mg/dL — ABNORMAL HIGH (ref 65–99)

## 2017-07-01 LAB — MRSA PCR SCREENING: MRSA by PCR: NEGATIVE

## 2017-07-01 LAB — TRIGLYCERIDES: TRIGLYCERIDES: 72 mg/dL (ref <150)

## 2017-07-01 MED ORDER — METHYLPREDNISOLONE SODIUM SUCC 125 MG IJ SOLR
INTRAMUSCULAR | Status: AC
  Administered 2017-07-01: 125 mg via INTRAVENOUS
  Filled 2017-07-01: qty 2

## 2017-07-01 MED ORDER — TIOTROPIUM BROMIDE MONOHYDRATE 18 MCG IN CAPS
18.0000 ug | ORAL_CAPSULE | Freq: Every day | RESPIRATORY_TRACT | Status: DC
  Filled 2017-07-01: qty 5

## 2017-07-01 MED ORDER — BUDESONIDE 0.25 MG/2ML IN SUSP
0.2500 mg | Freq: Two times a day (BID) | RESPIRATORY_TRACT | Status: DC
  Administered 2017-07-01 – 2017-07-02 (×2): 0.25 mg via RESPIRATORY_TRACT
  Filled 2017-07-01 (×2): qty 2

## 2017-07-01 MED ORDER — PANTOPRAZOLE SODIUM 40 MG IV SOLR: 40.0000 mg | INTRAVENOUS | Status: DC

## 2017-07-01 MED ORDER — FENTANYL 2500MCG IN NS 250ML (10MCG/ML) PREMIX INFUSION
25.0000 ug/h | INTRAVENOUS | Status: DC
  Administered 2017-07-01: 25 ug/h via INTRAVENOUS
  Filled 2017-07-01: qty 250

## 2017-07-01 MED ORDER — HEPARIN SODIUM (PORCINE) 5000 UNIT/ML IJ SOLN
5000.0000 [IU] | Freq: Three times a day (TID) | INTRAMUSCULAR | Status: DC
  Administered 2017-07-01 – 2017-07-02 (×2): 5000 [IU] via SUBCUTANEOUS
  Filled 2017-07-01 (×2): qty 1

## 2017-07-01 MED ORDER — LORAZEPAM 2 MG/ML IJ SOLN
INTRAMUSCULAR | Status: AC
  Filled 2017-07-01: qty 1

## 2017-07-01 MED ORDER — ORAL CARE MOUTH RINSE
15.0000 mL | OROMUCOSAL | Status: DC
  Administered 2017-07-01 – 2017-07-02 (×5): 15 mL via OROMUCOSAL

## 2017-07-01 MED ORDER — PROPOFOL 1000 MG/100ML IV EMUL
1.0000 ug/kg/min | Freq: Once | INTRAVENOUS | Status: DC
  Filled 2017-07-01: qty 100

## 2017-07-01 MED ORDER — FENTANYL BOLUS VIA INFUSION: 50.0000 ug | INTRAVENOUS | Status: DC | PRN

## 2017-07-01 MED ORDER — PROPOFOL 1000 MG/100ML IV EMUL
INTRAVENOUS | Status: AC
  Filled 2017-07-01: qty 100

## 2017-07-01 MED ORDER — FENTANYL CITRATE (PF) 100 MCG/2ML IJ SOLN
INTRAMUSCULAR | Status: AC
  Filled 2017-07-01: qty 2

## 2017-07-01 MED ORDER — MAGNESIUM SULFATE 2 GM/50ML IV SOLN
2.0000 g | Freq: Once | INTRAVENOUS | Status: AC
  Administered 2017-07-01: 2 g via INTRAVENOUS
  Filled 2017-07-01: qty 50

## 2017-07-01 MED ORDER — LORAZEPAM 2 MG/ML IJ SOLN
2.0000 mg | Freq: Once | INTRAMUSCULAR | Status: AC
  Administered 2017-07-01: 2 mg via INTRAVENOUS

## 2017-07-01 MED ORDER — FENTANYL CITRATE (PF) 100 MCG/2ML IJ SOLN
100.0000 ug | Freq: Once | INTRAMUSCULAR | Status: AC
  Administered 2017-07-01: 100 ug via INTRAVENOUS

## 2017-07-01 MED ORDER — IPRATROPIUM-ALBUTEROL 0.5-2.5 (3) MG/3ML IN SOLN
3.0000 mL | Freq: Once | RESPIRATORY_TRACT | Status: AC
  Administered 2017-07-01: 3 mL via RESPIRATORY_TRACT

## 2017-07-01 MED ORDER — IPRATROPIUM-ALBUTEROL 0.5-2.5 (3) MG/3ML IN SOLN
RESPIRATORY_TRACT | Status: AC
  Administered 2017-07-01: 3 mL via RESPIRATORY_TRACT
  Filled 2017-07-01: qty 9

## 2017-07-01 MED ORDER — LEVOFLOXACIN IN D5W 500 MG/100ML IV SOLN
500.0000 mg | INTRAVENOUS | Status: DC
  Administered 2017-07-01 – 2017-07-02 (×2): 500 mg via INTRAVENOUS
  Filled 2017-07-01 (×3): qty 100

## 2017-07-01 MED ORDER — ALBUTEROL SULFATE (2.5 MG/3ML) 0.083% IN NEBU
2.5000 mg | INHALATION_SOLUTION | Freq: Once | RESPIRATORY_TRACT | Status: AC
  Administered 2017-07-01: 2.5 mg via RESPIRATORY_TRACT

## 2017-07-01 MED ORDER — PROPOFOL 1000 MG/100ML IV EMUL
0.0000 ug/kg/min | INTRAVENOUS | Status: DC
  Administered 2017-07-01: 50 ug/kg/min via INTRAVENOUS

## 2017-07-01 MED ORDER — FENTANYL 2500MCG IN NS 250ML (10MCG/ML) PREMIX INFUSION: 25.0000 ug/h | INTRAVENOUS | Status: DC

## 2017-07-01 MED ORDER — DEXMEDETOMIDINE HCL IN NACL 400 MCG/100ML IV SOLN: 0.4000 ug/kg/h | INTRAVENOUS | Status: DC

## 2017-07-01 MED ORDER — DOCUSATE SODIUM 100 MG PO CAPS: 100.0000 mg | ORAL_CAPSULE | Freq: Two times a day (BID) | ORAL | Status: DC | PRN

## 2017-07-01 MED ORDER — ETOMIDATE 2 MG/ML IV SOLN
INTRAVENOUS | Status: AC | PRN
  Administered 2017-07-01: 20 mg via INTRAVENOUS

## 2017-07-01 MED ORDER — METHYLPREDNISOLONE SODIUM SUCC 125 MG IJ SOLR
60.0000 mg | Freq: Four times a day (QID) | INTRAMUSCULAR | Status: DC
  Administered 2017-07-01 – 2017-07-02 (×2): 60 mg via INTRAVENOUS
  Filled 2017-07-01 (×2): qty 2

## 2017-07-01 MED ORDER — LORAZEPAM 2 MG/ML IJ SOLN
1.0000 mg | INTRAMUSCULAR | Status: DC | PRN
  Administered 2017-07-01: 1 mg via INTRAVENOUS

## 2017-07-01 MED ORDER — FENTANYL CITRATE (PF) 100 MCG/2ML IJ SOLN: 50.0000 ug | Freq: Once | INTRAMUSCULAR | Status: DC

## 2017-07-01 MED ORDER — PROPOFOL 10 MG/ML IV BOLUS
50.0000 mg | Freq: Once | INTRAVENOUS | Status: AC
Start: 2017-07-01 — End: 2017-07-01
  Administered 2017-07-01: 50 mg via INTRAVENOUS
  Filled 2017-07-01: qty 20

## 2017-07-01 MED ORDER — PROPOFOL 10 MG/ML IV BOLUS: INTRAVENOUS | Status: AC | PRN

## 2017-07-01 MED ORDER — ALBUTEROL SULFATE (2.5 MG/3ML) 0.083% IN NEBU
INHALATION_SOLUTION | RESPIRATORY_TRACT | Status: AC
  Administered 2017-07-01: 2.5 mg via RESPIRATORY_TRACT
  Filled 2017-07-01: qty 9

## 2017-07-01 MED ORDER — PROPOFOL 10 MG/ML IV BOLUS
50.0000 mg | Freq: Once | INTRAVENOUS | Status: AC
  Administered 2017-07-01: 50 mg via INTRAVENOUS
  Filled 2017-07-01: qty 20

## 2017-07-01 MED ORDER — SUCCINYLCHOLINE CHLORIDE 20 MG/ML IJ SOLN
INTRAMUSCULAR | Status: AC | PRN
  Administered 2017-07-01: 100 mg via INTRAVENOUS

## 2017-07-01 MED ORDER — PROPOFOL 1000 MG/100ML IV EMUL
INTRAVENOUS | Status: AC | PRN
  Administered 2017-07-01: 20 mg/h via INTRAVENOUS
  Administered 2017-07-01: 1 ug/kg/min via INTRAVENOUS

## 2017-07-01 MED ORDER — IPRATROPIUM-ALBUTEROL 0.5-2.5 (3) MG/3ML IN SOLN
3.0000 mL | RESPIRATORY_TRACT | Status: DC
  Administered 2017-07-01 – 2017-07-02 (×4): 3 mL via RESPIRATORY_TRACT
  Filled 2017-07-01 (×4): qty 3

## 2017-07-01 MED ORDER — METHYLPREDNISOLONE SODIUM SUCC 125 MG IJ SOLR
125.0000 mg | Freq: Once | INTRAMUSCULAR | Status: AC
  Administered 2017-07-01: 125 mg via INTRAVENOUS

## 2017-07-01 MED ORDER — SODIUM CHLORIDE 0.9 % IV SOLN: 25.0000 ug/h | INTRAVENOUS | Status: DC

## 2017-07-01 MED ORDER — IPRATROPIUM-ALBUTEROL 0.5-2.5 (3) MG/3ML IN SOLN: 3.0000 mL | RESPIRATORY_TRACT | Status: DC | PRN

## 2017-07-01 MED ORDER — FENTANYL CITRATE (PF) 100 MCG/2ML IJ SOLN
INTRAMUSCULAR | Status: AC
Start: 2017-07-01 — End: 2017-07-02
  Filled 2017-07-01: qty 2

## 2017-07-01 MED ORDER — CHLORHEXIDINE GLUCONATE 0.12% ORAL RINSE (MEDLINE KIT)
15.0000 mL | Freq: Two times a day (BID) | OROMUCOSAL | Status: DC
  Administered 2017-07-02: 15 mL via OROMUCOSAL

## 2017-07-01 NOTE — ED Notes (Signed)
X-ray at bedside

## 2017-07-01 NOTE — Code Documentation (Signed)
Patient preoxygenated with Bipap at this time.

## 2017-07-01 NOTE — Consult Note (Signed)
PULMONARY / CRITICAL CARE MEDICINE   Name: Caitlyn Evans MRN: [id number] DOB: [birth year]    ADMISSION DATE:  07/01/2017 CONSULTATION DATE:  07/01/17  REFERRING MD:  Dr. Elisabeth Pigeon  CHIEF COMPLAINT:  Respiratory Distress  HISTORY OF PRESENT ILLNESS: Caitlyn Evans is a 51 year old female with known history of Anxiety,Asthma, CHF, Tobacco Abuse,MI, Hypertension and Lung mass.  Patient presented to ED on 10/8 with shortness of breath.  She was given prednisone and levaquin and instructed to follow up as outpatient.  However patient presented to ED on 10/16 with worsening shortness of breath.  She was tried on Bipap but failed and was in severe respiratory distress therefore patient was intubated. Patient was admitted to the ICU. CCM team was consulted for further management.    PAST MEDICAL HISTORY :  She  has a past medical history of Anxiety; Asthma; BOOP (bronchiolitis obliterans with organizing pneumonia) (HCC); CHF (congestive heart failure) (HCC); Dysphagia, pharyngoesophageal phase (05/30/2015); Emphysema lung (HCC) (05/30/2015); Hypercholesterolemia; Hypertension; Lung mass; Migraines; Motion sickness; Myocardial infarction [facility]) (2012); Seasonal allergies; and Shortness of breath dyspnea.  PAST SURGICAL HISTORY: She  has a past surgical history that includes Vaginal hysterectomy; Lung surgery (Left); Oophorectomy (Left); Colonoscopy with propofol (N/A, 06/02/2015); and Esophagogastroduodenoscopy (egd) with propofol (N/A, 06/02/2015).  Allergies  Allergen Reactions  . Penicillins Hives and Other (See Comments)    Has patient had a PCN reaction causing immediate rash, facial/tongue/throat swelling, SOB or lightheadedness with hypotension: No Has patient had a PCN reaction causing severe rash involving mucus membranes or skin necrosis: No Has patient had a PCN reaction that required hospitalization No Has patient had a PCN reaction occurring within the last 10 years: No If all of the above  answers are "NO", then may proceed with Cephalosporin use.  . Codeine Hives and Nausea And Vomiting    No current facility-administered medications on file prior to encounter.    Current Outpatient Prescriptions on File Prior to Encounter  Medication Sig  . ALPRAZolam (XANAX) 0.25 MG tablet Take 0.25 mg by mouth 2 (two) times daily.   Marland Kitchen donepezil (ARICEPT) 10 MG tablet Take 10 mg by mouth daily.   Marland Kitchen ipratropium-albuterol (DUONEB) 0.5-2.5 (3) MG/3ML SOLN Inhale 3 mLs into the lungs every 4 (four) hours as needed (for wheezing/shortness of breath). (Patient taking differently: Inhale 3 mLs into the lungs every 4 (four) hours as needed. For wheezing/shortness of breath)  . levofloxacin (LEVAQUIN) 500 MG tablet Take 1 tablet (500 mg total) by mouth daily.  . Multiple Vitamins-Minerals (MULTIVITAMIN GUMMIES ADULT) CHEW Chew 2 each by mouth daily.   . pantoprazole (PROTONIX) 40 MG tablet Take 1 tablet (40 mg total) by mouth daily.  . predniSONE (STERAPRED UNI-PAK 21 TAB) 10 MG (21) TBPK tablet Take by mouth daily. Dispense steroid taper pak as directed    FAMILY HISTORY:  Her indicated that her mother is deceased. She indicated that her father is alive.    SOCIAL HISTORY: She  reports that she has been smoking.  She has a 30.00 pack-year smoking history. She has never used smokeless tobacco. She reports that she does not drink alcohol or use drugs.  REVIEW OF SYSTEMS:   Unable to assess  SUBJECTIVE:  Unable to assess  VITAL SIGNS: BP 102/78   Pulse (!) 105   Temp 98.3 F (36.8 C)   Resp (!) 21   SpO2 100%   HEMODYNAMICS:    VENTILATOR SETTINGS: Vent Mode: AC FiO2 (%):  [35 %-50 %]  35 % Set Rate:  [20 bmp] 20 bmp Vt Set:  [400 mL] 400 mL PEEP:  [5 cmH20] 5 cmH20  INTAKE / OUTPUT: I/O last 3 completed shifts: In: 72 [IV Piggyback:50] Out: -   PHYSICAL EXAMINATION: General:  51 year old female intubated and on mechanical ventilation. Neuro:  Sedated HEENT: AT,[place], No  jvd Cardiovascular:  S1S2,regular, no m/r/g Lungs:  Diminished  Bibasilar, no wheezes,crackles and rhonchi Abdomen:  Soft,NT,ND Musculoskeletal:  No edema/cyanosis Skin: warm,dry and Intact  LABS:  BMET  Recent Labs Lab 07/01/17 1719  NA 144  K 4.3  CL 103  CO2 27  BUN 17  CREATININE 0.65  GLUCOSE 101*    Electrolytes  Recent Labs Lab 07/01/17 1719  CALCIUM 9.0    CBC  Recent Labs Lab 07/01/17 1719  WBC 10.1  HGB 14.5  HCT 44.2  PLT 271    Coag's No results for input(s): APTT, INR in the last 168 hours.  Sepsis Markers No results for input(s): LATICACIDVEN, PROCALCITON, O2SATVEN in the last 168 hours.  ABG No results for input(s): PHART, PCO2ART, PO2ART in the last 168 hours.  Liver Enzymes No results for input(s): AST, ALT, ALKPHOS, BILITOT, ALBUMIN in the last 168 hours.  Cardiac Enzymes  Recent Labs Lab 07/01/17 1719  TROPONINI <0.03    Glucose No results for input(s): GLUCAP in the last 168 hours.  Imaging Dg Abdomen 1 View  Result Date: 07/01/2017 CLINICAL DATA:  OG tube placement EXAM: ABDOMEN - 1 VIEW COMPARISON:  None. FINDINGS: Esophageal tube tip projects over the mid stomach, side-port projects over gastric fundus. Nonobstructed bowel-gas pattern with moderate stool. IMPRESSION: Esophageal tube tip and side-port project over the mid to proximal stomach. Non obstructed gas pattern. Electronically Signed   By: Jasmine Pang M.D.   On: 07/01/2017 19:34   Dg Chest Portable 1 View  Result Date: 07/01/2017 CLINICAL DATA:  Post intubation EXAM: PORTABLE CHEST 1 VIEW COMPARISON:  07/01/2017, 04/15/2017 FINDINGS: Endotracheal tube tip is about 3.2 cm superior to the carina. Esophageal tube tip is below the diaphragm. Biapical pleural and parenchymal scarring. No acute infiltrate or effusion. Normal cardiomediastinal silhouette with atherosclerosis. No pneumothorax. IMPRESSION: 1. Endotracheal tube tip about 3.2 cm superior to the carina 2.  Negative for edema or infiltrate. Electronically Signed   By: Jasmine Pang M.D.   On: 07/01/2017 19:36   Dg Chest Portable 1 View  Result Date: 07/01/2017 CLINICAL DATA:  Short of breath EXAM: PORTABLE CHEST 1 VIEW COMPARISON:  06/23/2017 FINDINGS: Heart size within normal limits. Minimal atherosclerosis. No acute consolidation or effusion. Negative for a pneumothorax. IMPRESSION: No active disease. Electronically Signed   By: Jasmine Pang M.D.   On: 07/01/2017 17:38     STUDIES:  none  CULTURES: 07/01/17 Blood Culture>> 07/01/17 Sputum Culture  ANTIBIOTICS: levaquin  SIGNIFICANT EVENTS: 10/16 Patient presented with respiratory distress>> Failed BiPAP>> Intubated  LINES/TUBES: 07/01/17 ET tube   ASSESSMENT / PLAN:  PULMONARY A: Acute On Chronic Respiratory failure Secondary to COPD Exacerbation Acute Bronchitis Tobacco Abuse P:   Vent setting reviewed Continue Levaquin Continue Bronchodilators Continue Steroids,taper SBT trials in am CXR in am  CARDIOVASCULAR A:  Hx of CHF/ MI/ Hypertension P:  Normotensive currently  RENAL A:   No active issues P:   Monitor I/O Follow BMET intermittently  GASTROINTESTINAL A:   No active issues P:   Protonix for GIP  HEMATOLOGIC A:   No active issues P:  Transfuse if Hgb<7 Heparin for  DVT prophylaxis  INFECTIOUS A:   Acute bronchitis P:   Continue Levaquin Follow Cultures Monitor Fever,CBC  ENDOCRINE A:   NO active issues P:   Monitor Blood glucose intermittently with BMP  NEUROLOGIC A:  Anxiety Vent Associated Discomfort P:   RASS goal: 0 to -1 PAD Protocol implemented    FAMILY  - Updates: No family present at the bedside.    Bincy Varughese,AG-ACNP Pulmonary and Critical Care Medicine [facility]   07/01/2017, 8:08 PM   Billy Fischer, MD PCCM service Mobile [phone] Pager [phone] 07/02/2017 2:21 PM

## 2017-07-01 NOTE — Progress Notes (Signed)
ANTIBIOTIC CONSULT NOTE - INITIAL  Pharmacy Consult for Levaquin  Indication: COPD exacerbation  Allergies  Allergen Reactions  . Penicillins Hives and Other (See Comments)    Has patient had a PCN reaction causing immediate rash, facial/tongue/throat swelling, SOB or lightheadedness with hypotension: No Has patient had a PCN reaction causing severe rash involving mucus membranes or skin necrosis: No Has patient had a PCN reaction that required hospitalization No Has patient had a PCN reaction occurring within the last 10 years: No If all of the above answers are "NO", then may proceed with Cephalosporin use.  . Codeine Hives and Nausea And Vomiting    Patient Measurements:   Adjusted Body Weight:   Vital Signs: Temp: 98.7 F (37.1 C) (10/16 2040) BP: 154/90 (10/16 2040) Pulse Rate: 122 (10/16 2040) Intake/Output from previous day: No intake/output data recorded. Intake/Output from this shift: No intake/output data recorded.  Labs:  Recent Labs  07/01/17 1719  WBC 10.1  HGB 14.5  PLT 271  CREATININE 0.65   Estimated Creatinine Clearance: 57.6 mL/min (by C-G formula based on SCr of 0.65 mg/dL). No results for input(s): VANCOTROUGH, VANCOPEAK, VANCORANDOM, GENTTROUGH, GENTPEAK, GENTRANDOM, TOBRATROUGH, TOBRAPEAK, TOBRARND, AMIKACINPEAK, AMIKACINTROU, AMIKACIN in the last 72 hours.   Microbiology: No results found for this or any previous visit (from the past 720 hour(s)).  Medical History: Past Medical History:  Diagnosis Date  . Anxiety    panic attacks  . Asthma   . BOOP (bronchiolitis obliterans with organizing pneumonia) (HCC)   . CHF (congestive heart failure) (HCC)   . Dysphagia, pharyngoesophageal phase 05/30/2015  . Emphysema lung (HCC) 05/30/2015  . Hypercholesterolemia   . Hypertension   . Lung mass   . Migraines   . Motion sickness    all moving vehicles  . Myocardial infarction (HCC) 2012  . Seasonal allergies   . Shortness of breath dyspnea    1 flight-stairs    Medications:   (Not in a hospital admission) Assessment: CrCl = 57.6 ml/min   Goal of Therapy:  resolution of infection   Plan:  Expected duration 7 days with resolution of temperature and/or normalization of WBC   Will start levaquin 500 mg IV Q24H on 10/16 @ 21:00.   Sanaia Jasso D 07/01/2017,8:48 PM

## 2017-07-01 NOTE — ED Notes (Signed)
Vent alarming. RT called. Patient sitting up in bed, attempting to pull ET tube, coughing.

## 2017-07-01 NOTE — ED Provider Notes (Signed)
[facility] Emergency Department Provider Note ____________________________________________   First MD Initiated Contact with Patient 07/01/17 1722     (approximate)  I have reviewed the triage vital signs and the nursing notes.   HISTORY  Chief Complaint Respiratory Distress  HPI severely limited due to acute respiratory distress  HPI Caitlyn Evans is a 51 y.o. female with a history of CHF and COPD who presents with worsening shortness of breath over the last week, gradual onset, associated with cough, and not relieved by nebulizers at home.  Past Medical History:  Diagnosis Date  . Anxiety    panic attacks  . Asthma   . BOOP (bronchiolitis obliterans with organizing pneumonia) (HCC)   . CHF (congestive heart failure) (HCC)   . Dysphagia, pharyngoesophageal phase 05/30/2015  . Emphysema lung (HCC) 05/30/2015  . Hypercholesterolemia   . Hypertension   . Lung mass   . Migraines   . Motion sickness    all moving vehicles  . Myocardial infarction (HCC) 2012  . Seasonal allergies   . Shortness of breath dyspnea    1 flight-stairs    Patient Active Problem List   Diagnosis Date Noted  . COPD (chronic obstructive pulmonary disease) (HCC) 07/19/2016  . Acute on chronic respiratory failure (HCC) 05/09/2016  . Noncompliance with medication regimen 05/09/2016  . COPD with acute exacerbation (HCC) 02/05/2016  . Acute exacerbation of chronic obstructive pulmonary disease (COPD) (HCC)   . Acute respiratory failure (HCC)   . Acute respiratory failure with hypoxia (HCC) 12/05/2015  . BOOP (bronchiolitis obliterans with organizing pneumonia) (HCC) 12/05/2015  . Depression, major, single episode, moderate (HCC) 07/14/2015  . Adjustment disorder with anxiety 07/14/2015  . COPD exacerbation (HCC) 07/14/2015  . Special screening for malignant neoplasms, colon   . Swallowing difficulty   . Loss of weight   . Esophagogastric ulcer   . Esophageal ulcer   .  Hypertension 05/30/2015  . Emphysema lung (HCC) 05/30/2015  . Dysphagia, pharyngoesophageal phase 05/30/2015    Past Surgical History:  Procedure Laterality Date  . COLONOSCOPY WITH PROPOFOL N/A 06/02/2015   Procedure: COLONOSCOPY WITH PROPOFOL;  Surgeon: Midge Minium, MD;  Location: [facility];  Service: Endoscopy;  Laterality: N/A;  . ESOPHAGOGASTRODUODENOSCOPY (EGD) WITH PROPOFOL N/A 06/02/2015   Procedure: ESOPHAGOGASTRODUODENOSCOPY (EGD) WITH PROPOFOL withdialation;  Surgeon: Midge Minium, MD;  Location: [facility];  Service: Endoscopy;  Laterality: N/A;  . LUNG SURGERY Left    Upper lobe removed  . OOPHORECTOMY Left   . VAGINAL HYSTERECTOMY      Prior to Admission medications   Medication Sig Start Date End Date Taking? Authorizing Provider  ALPRAZolam Prudy Feeler) 0.25 MG tablet Take 1 tablet by mouth 2 (two) times daily. 06/13/17   [provider]  amLODipine (NORVASC) 5 MG tablet Take 1 tablet (5 mg total) by mouth daily. Patient not taking: Reported on 06/23/2017 05/13/16   Enid Baas, MD  donepezil (ARICEPT) 10 MG tablet Take 1 tablet by mouth daily. 06/13/17   [provider]  ipratropium-albuterol (DUONEB) 0.5-2.5 (3) MG/3ML SOLN Inhale 3 mLs into the lungs every 4 (four) hours as needed (for wheezing/shortness of breath). 05/13/16   Enid Baas, MD  levofloxacin (LEVAQUIN) 500 MG tablet Take 1 tablet (500 mg total) by mouth daily. 07/20/16   Adrian Saran, MD  levofloxacin (LEVAQUIN) 500 MG tablet Take 1 tablet (500 mg total) by mouth daily. 06/23/17 07/03/17  Emily Filbert, MD  LORazepam (ATIVAN) 1 MG tablet Take  1 tablet (1 mg total) by mouth 2 (two) times daily. Patient not taking: Reported on 06/23/2017 07/20/16   Adrian Saran, MD  Multiple Vitamins-Minerals (MULTIVITAMIN GUMMIES ADULT) CHEW Chew 2 each by mouth daily.     [provider]  nicotine (NICODERM CQ - DOSED IN MG/24 HOURS) 21 mg/24hr patch Place 1 patch (21 mg  total) onto the skin daily. Patient not taking: Reported on 06/23/2017 07/20/16   Adrian Saran, MD  pantoprazole (PROTONIX) 40 MG tablet Take 1 tablet (40 mg total) by mouth daily. 05/13/16   Enid Baas, MD  predniSONE (DELTASONE) 10 MG tablet Take 1 tablet (10 mg total) by mouth daily with breakfast. 60 mg PO (ORAL) x 2 days 50 mg PO (ORAL)  x 2 days 40 mg PO (ORAL)  x 2 days 30 mg PO  (ORAL)  x 2 days 20 mg PO  (ORAL) x 2 days 10 mg PO  (ORAL) x 2 days then stop 07/20/16   Adrian Saran, MD  predniSONE (STERAPRED UNI-PAK 21 TAB) 10 MG (21) TBPK tablet Take by mouth daily. Dispense steroid taper pak as directed 06/23/17   Emily Filbert, MD    Allergies Penicillins and Codeine  Family History  Problem Relation Age of Onset  . COPD Mother   . Heart attack Mother   . Stroke Father   . COPD Father     Social History Social History  Substance Use Topics  . Smoking status: Current Every Day Smoker    Packs/day: 1.00    Years: 30.00  . Smokeless tobacco: Never Used     Comment: quit some time in 2012  . Alcohol use No     Comment: rare consumption    Review of Systems Level V caveat: unable to obtain review of symptoms due to acute respiratory distress    ____________________________________________   PHYSICAL EXAM:  VITAL SIGNS: ED Triage Vitals  Enc Vitals Group     BP 07/01/17 1738 (!) 162/112     Pulse Rate 07/01/17 1725 (!) 116     Resp 07/01/17 1726 (!) 36     Temp --      Temp src --      SpO2 07/01/17 1725 100 %     Weight --      Height --      Head Circumference --      Peak Flow --      Pain Score --      Pain Loc --      Pain Edu? --      Excl. in GC? --     Constitutional: Alert and oriented. Severe respiratory distress, anxious appearing. Eyes: Conjunctivae are normal.  Head: Atraumatic. Nose: No congestion/rhinnorhea. Mouth/Throat: Mucous membranes are moist.   Neck: Normal range of motion.  Cardiovascular: Tachycardic, regular  rhythm. Grossly normal heart sounds.  Good peripheral circulation. Respiratory: Respiratory distress, bilat wheeze.  Gastrointestinal:  No distention.  Genitourinary: No CVA tenderness. Musculoskeletal: No lower extremity edema.  Extremities warm and well perfused.  Neurologic:  Normal speech and language. No gross focal neurologic deficits are appreciated.  Skin:  Skin is warm and dry. No rash noted. Psychiatric: Mood and affect are normal. Speech and behavior are normal.  ____________________________________________   LABS (all labs ordered are listed, but only abnormal results are displayed)  Labs Reviewed  BASIC METABOLIC PANEL - Abnormal; Notable for the following:       Result Value   Glucose, Bld 101 (*)  All other components within normal limits  CBC WITH DIFFERENTIAL/PLATELET - Abnormal; Notable for the following:    RDW 16.5 (*)    Neutro Abs 8.2 (*)    Lymphs Abs 0.9 (*)    Monocytes Absolute 1.0 (*)    All other components within normal limits  URINALYSIS, COMPLETE (UACMP) WITH MICROSCOPIC - Abnormal; Notable for the following:    Color, Urine STRAW (*)    APPearance CLEAR (*)    Hgb urine dipstick MODERATE (*)    Squamous Epithelial / LPF 0-5 (*)    All other components within normal limits  BLOOD GAS, VENOUS - Abnormal; Notable for the following:    pCO2, Ven 64 (*)    Bicarbonate 31.5 (*)    Acid-Base Excess 2.9 (*)    All other components within normal limits  CULTURE, BLOOD (ROUTINE X 2)  CULTURE, BLOOD (ROUTINE X 2)  TROPONIN I   ____________________________________________  EKG  ED ECG REPORT I, Dionne Bucy, the attending physician, personally viewed and interpreted this ECG.  Date: 07/01/2017 EKG Time: 1732 Rate: 131 Rhythm: sinus tachycardia QRS Axis: normal Intervals: normal ST/T Wave abnormalities: normal Narrative Interpretation: poor baseline, difficult to interpret; no evidence of acute  ischemia  ____________________________________________  RADIOLOGY  CXR: no focal infiltrate, no pulmonary edema.   ____________________________________________   PROCEDURES  Procedure(s) performed: Yes  INTUBATION Performed by: Dionne Bucy  Required items: required blood products, implants, devices, and special equipment available Patient identity confirmed: provided demographic data and hospital-assigned identification number Time out: Immediately prior to procedure a "time out" was called to verify the correct patient, procedure, equipment, support staff and site/side marked as required.  Indications: Respiratory failure  Intubation method: Glidescope Laryngoscopy   Preoxygenation: BiPAP  Sedatives:  Etomidate Paralytic:  Succinylcholine  Tube Size: 7.5 cuffed  Post-procedure assessment: chest rise and ETCO2 monitor Breath sounds: equal and absent over the epigastrium Tube secured with: ETT holder Chest x-ray interpreted by radiologist and me.  Chest x-ray findings: endotracheal tube in appropriate position  Patient tolerated the procedure well with no immediate complications.      Critical Care performed: Yes  CRITICAL CARE Performed by: Dionne Bucy   Total critical care time: 60 minutes  Critical care time was exclusive of separately billable procedures and treating other patients.  Critical care was necessary to treat or prevent imminent or life-threatening deterioration.  Critical care was time spent personally by me on the following activities: development of treatment plan with patient and/or surrogate as well as nursing, discussions with consultants, evaluation of patient's response to treatment, examination of patient, obtaining history from patient or surrogate, ordering and performing treatments and interventions, ordering and review of laboratory studies, ordering and review of radiographic studies, pulse oximetry and  re-evaluation of patient's condition.  ____________________________________________   INITIAL IMPRESSION / ASSESSMENT AND PLAN / ED COURSE  Pertinent labs & imaging results that were available during my care of the patient were reviewed by me and considered in my medical decision making (see chart for details).  51 year old female with history of COPD and CHF presents with worsening shortness of breath over the last several days and acute respiratory distress. On exam, patient is anxious appearing, very tachypneic and tachycardic, diaphoretic and lung exam reveals bilateral wheeze. No peripheral edema or evidence of fluid overload. On review of patient's past medical records in Epic, she had a visit 1 week ago for sob and likely COPD exacerbation but improved and was discharged.  Differential includes COPD exacerbation most likely, less likely CHF, or pneumonia. Patient immediately placed on BiPAP however there has been no immediate improvement in her symptoms. Patient's CO2 is elevated. She remains in some respiratory distress despite nebulizer, steroids, and magnesium. We'll continue to monitor closely however anticipate patient may need intubation for respiratory failure.    ----------------------------------------- 6:46 PM on 07/01/2017 -----------------------------------------  Patient remained in persistent respiratory distress with tachypnea to the high 30s, and stated her symptoms were not improving. Patient appeared more tired. Required intubation for respiratory failure. Intubated successfully on first attempt. We Will plan for admission.  ----------------------------------------- 7:02 PM on 07/01/2017 -----------------------------------------  Patient signed out to hospitalist. Now stable on ventilator.  ____________________________________________   FINAL CLINICAL IMPRESSION(S) / ED DIAGNOSES  Final diagnoses:  Acute respiratory failure with hypercapnia (HCC)  COPD  exacerbation (HCC)      NEW MEDICATIONS STARTED DURING THIS VISIT:  New Prescriptions   No medications on file     Note:  This document was prepared using Dragon voice recognition software and may include unintentional dictation errors.   ____________________________________________   FINAL CLINICAL IMPRESSION(S) / ED DIAGNOSES  Final diagnoses:  Acute respiratory failure with hypercapnia (HCC)  COPD exacerbation (HCC)      NEW MEDICATIONS STARTED DURING THIS VISIT:  New Prescriptions   No medications on file     Note:  This document was prepared using Dragon voice recognition software and may include unintentional dictation errors.    Dionne Bucy, MD 07/01/17 1902

## 2017-07-01 NOTE — ED Notes (Signed)
RT at bedside.

## 2017-07-01 NOTE — ED Notes (Signed)
Patient transported to ICU on zoll monitor by Clinton Sawyer, RN and RT.

## 2017-07-01 NOTE — ED Triage Notes (Signed)
Patient presents to ED via POV from home with c/o SOB. Patient with inspiratory and expiratory wheezes noted. Patient in tripod position. Face is red, patient with non productive cough. Patient wheeled back to room 2 by triage RN, stephanie. MD notified and at bedside. A&O x4.

## 2017-07-01 NOTE — H&P (Signed)
[facility] [place] [facility]   PATIENT NAME: Caitlyn Evans    MR#:  [id number]  DATE OF BIRTH:  30-May-1966  DATE OF ADMISSION:  07/01/2017  PRIMARY CARE PHYSICIAN: Corky Downs, MD   REQUESTING/REFERRING PHYSICIAN: [email or web address]  CHIEF COMPLAINT:   Chief Complaint  Patient presents with  . Respiratory Distress    HISTORY OF PRESENT ILLNESS: Caitlyn Evans  is a 51 y.o. female with a known history of anxiety, asthma, CHF, hypertension, COPD, hypertension, current smoker, chronic, oxygen use- was in emergency room last week for her worsening shortness of breath and COPD, sent home with oral steroid and nebulizer prescriptions. In spite of using that she continued to feel worsening in her shortness of breath. Today she was not able to breathe at all and so brought to the emergency room by family, noted to be in acute respiratory distress and started on BiPAP initially still continue to have tachypnea and respiratory distress so finally intubated by ER physician and given to hospitalist team for further management. Family denies any history of fever and chest x-ray does not show any pneumonia.  PAST MEDICAL HISTORY:   Past Medical History:  Diagnosis Date  . Anxiety    panic attacks  . Asthma   . BOOP (bronchiolitis obliterans with organizing pneumonia) (HCC)   . CHF (congestive heart failure) (HCC)   . Dysphagia, pharyngoesophageal phase 05/30/2015  . Emphysema lung (HCC) 05/30/2015  . Hypercholesterolemia   . Hypertension   . Lung mass   . Migraines   . Motion sickness    all moving vehicles  . Myocardial infarction (HCC) 2012  . Seasonal allergies   . Shortness of breath dyspnea    1 flight-stairs    PAST SURGICAL HISTORY: Past Surgical History:  Procedure Laterality Date  . COLONOSCOPY WITH PROPOFOL N/A 06/02/2015   Procedure: COLONOSCOPY WITH PROPOFOL;  Surgeon: Midge Minium, MD;  Location: [facility];  Service: Endoscopy;  Laterality: N/A;  .  ESOPHAGOGASTRODUODENOSCOPY (EGD) WITH PROPOFOL N/A 06/02/2015   Procedure: ESOPHAGOGASTRODUODENOSCOPY (EGD) WITH PROPOFOL withdialation;  Surgeon: Midge Minium, MD;  Location: [facility];  Service: Endoscopy;  Laterality: N/A;  . LUNG SURGERY Left    Upper lobe removed  . OOPHORECTOMY Left   . VAGINAL HYSTERECTOMY      SOCIAL HISTORY:  Social History  Substance Use Topics  . Smoking status: Current Every Day Smoker    Packs/day: 1.00    Years: 30.00  . Smokeless tobacco: Never Used     Comment: quit some time in 2012  . Alcohol use No     Comment: rare consumption    FAMILY HISTORY:  Family History  Problem Relation Age of Onset  . COPD Mother   . Heart attack Mother   . Stroke Father   . COPD Father     DRUG ALLERGIES:  Allergies  Allergen Reactions  . Penicillins Hives and Other (See Comments)    Has patient had a PCN reaction causing immediate rash, facial/tongue/throat swelling, SOB or lightheadedness with hypotension: No Has patient had a PCN reaction causing severe rash involving mucus membranes or skin necrosis: No Has patient had a PCN reaction that required hospitalization No Has patient had a PCN reaction occurring within the last 10 years: No If all of the above answers are "NO", then may proceed with Cephalosporin use.  . Codeine Hives and Nausea And Vomiting    REVIEW OF SYSTEMS:   Patient is intubated currently and cannot give  review of system.  MEDICATIONS AT HOME:  Prior to Admission medications   Medication Sig Start Date End Date Taking? Authorizing Provider  ALPRAZolam (XANAX) 0.25 MG tablet Take 0.25 mg by mouth 2 (two) times daily.    Yes [provider]  donepezil (ARICEPT) 10 MG tablet Take 10 mg by mouth daily.    Yes [provider]  ipratropium-albuterol (DUONEB) 0.5-2.5 (3) MG/3ML SOLN Inhale 3 mLs into the lungs every 4 (four) hours as needed (for wheezing/shortness of breath). Patient taking differently: Inhale 3  mLs into the lungs every 4 (four) hours as needed. For wheezing/shortness of breath 05/13/16  Yes Enid Baas, MD  levofloxacin (LEVAQUIN) 500 MG tablet Take 1 tablet (500 mg total) by mouth daily. 06/23/17 07/03/17 Yes Emily Filbert, MD  Multiple Vitamins-Minerals (MULTIVITAMIN GUMMIES ADULT) CHEW Chew 2 each by mouth daily.    Yes [provider]  pantoprazole (PROTONIX) 40 MG tablet Take 1 tablet (40 mg total) by mouth daily. 05/13/16  Yes Enid Baas, MD  predniSONE (STERAPRED UNI-PAK 21 TAB) 10 MG (21) TBPK tablet Take by mouth daily. Dispense steroid taper pak as directed 06/23/17  Yes Emily Filbert, MD      PHYSICAL EXAMINATION:   VITAL SIGNS: Blood pressure 113/72, pulse (!) 103, temperature 99 F (37.2 C), resp. rate (!) 27, SpO2 100 %.  GENERAL:  51 y.o.-year-old patient lying in the bed with  acute distress, Completely sedated currently, still moving her arms and legs.Marland Kitchen  EYES: Pupils equal, round, reactive to light and accommodation. No scleral icterus. Extraocular muscles intact.  HEENT: Head atraumatic, normocephalic. Oropharynx and nasopharynx clear. Endotracheal tube in place. NECK:  Supple, no jugular venous distention. No thyroid enlargement, no tenderness.  LUNGS: Normal breath sounds bilaterally, bilateral wheezing, no crepitation. No use of accessory muscles of respiration. On ventilatory support. CARDIOVASCULAR: S1, S2 normal. No murmurs, rubs, or gallops.  ABDOMEN: Soft, nontender, nondistended. Bowel sounds present. No organomegaly or mass.  EXTREMITIES: No pedal edema, cyanosis, or clubbing.  NEUROLOGIC: on ventilatory support, currently moving all limbs has not completely sedated. PSYCHIATRIC: The patient is intubated.  SKIN: No obvious rash, lesion, or ulcer.   LABORATORY PANEL:   CBC  Recent Labs Lab 07/01/17 1719  WBC 10.1  HGB 14.5  HCT 44.2  PLT 271  MCV 90.5  MCH 29.7  MCHC 32.9  RDW 16.5*  LYMPHSABS 0.9*   MONOABS 1.0*  EOSABS 0.0  BASOSABS 0.0   ------------------------------------------------------------------------------------------------------------------  Chemistries   Recent Labs Lab 07/01/17 1719  NA 144  K 4.3  CL 103  CO2 27  GLUCOSE 101*  BUN 17  CREATININE 0.65  CALCIUM 9.0   ------------------------------------------------------------------------------------------------------------------ estimated creatinine clearance is 57.6 mL/min (by C-G formula based on SCr of 0.65 mg/dL). ------------------------------------------------------------------------------------------------------------------ No results for input(s): TSH, T4TOTAL, T3FREE, THYROIDAB in the last 72 hours.  Invalid input(s): FREET3   Coagulation profile No results for input(s): INR, PROTIME in the last 168 hours. ------------------------------------------------------------------------------------------------------------------- No results for input(s): DDIMER in the last 72 hours. -------------------------------------------------------------------------------------------------------------------  Cardiac Enzymes  Recent Labs Lab 07/01/17 1719  TROPONINI <0.03   ------------------------------------------------------------------------------------------------------------------ Invalid input(s): POCBNP  ---------------------------------------------------------------------------------------------------------------  Urinalysis    Component Value Date/Time   COLORURINE STRAW (A) 07/01/2017 1721   APPEARANCEUR CLEAR (A) 07/01/2017 1721   APPEARANCEUR Hazy 10/13/2011 2052   LABSPEC 1.010 07/01/2017 1721   LABSPEC 1.010 10/13/2011 2052   PHURINE 6.0 07/01/2017 1721   GLUCOSEU NEGATIVE 07/01/2017 1721   GLUCOSEU Negative 10/13/2011 2052  HGBUR MODERATE (A) 07/01/2017 1721   BILIRUBINUR NEGATIVE 07/01/2017 1721   BILIRUBINUR Negative 10/13/2011 2052   KETONESUR NEGATIVE 07/01/2017 1721    PROTEINUR NEGATIVE 07/01/2017 1721   NITRITE NEGATIVE 07/01/2017 1721   LEUKOCYTESUR NEGATIVE 07/01/2017 1721   LEUKOCYTESUR Negative 10/13/2011 2052     RADIOLOGY: Dg Chest Portable 1 View  Result Date: 07/01/2017 CLINICAL DATA:  Short of breath EXAM: PORTABLE CHEST 1 VIEW COMPARISON:  06/23/2017 FINDINGS: Heart size within normal limits. Minimal atherosclerosis. No acute consolidation or effusion. Negative for a pneumothorax. IMPRESSION: No active disease. Electronically Signed   By: Jasmine Pang M.D.   On: 07/01/2017 17:38    EKG: Orders placed or performed during the hospital encounter of 07/01/17  . ED EKG  . ED EKG  . EKG 12-Lead  . EKG 12-Lead    IMPRESSION AND PLAN:  * acute on chronic respiratory failure   Acute exacerbation of COPD   Acute bronchitis    Intubated, ventilatory support.   Intensivist to help management.   iV steroid, inhaled steroid, nebulized bronchodilator.   IV antibiotics.  * anxiety   Currently on sedation medication, IV Ativan.  All the records are reviewed and case discussed with ED provider. Management plans discussed with the patient, family and they are in agreement.  CODE STATUS: full. Code Status History    Date Active Date Inactive Code Status Order ID Comments User Context   07/19/2016 11:48 AM 07/20/2016  8:10 PM Full Code [id number]  Adrian Saran, MD Inpatient   05/09/2016  2:40 PM 05/13/2016  2:37 PM Full Code [id number]  Merwyn Katos, MD ED   02/05/2016  7:14 PM 02/08/2016  2:52 PM Full Code [id number]  Ramonita Lab, MD Inpatient   12/05/2015 11:02 PM 12/09/2015  3:35 PM Full Code [id number]  Oralia Manis, MD Inpatient       TOTAL TIME TAKING CARE OF THIS PATIENT: 50 critical care minutes.   Past with her daughter and mother-in-law in the room.  Altamese Dilling M.D on 07/01/2017   Between 7am to 6pm - Pager - [phone]  After 6pm go to [email or web address] - password [email or web address]  [facility]  Office   [phone]  CC: Primary care physician; Corky Downs, MD   Note: This dictation was prepared with Dragon dictation along with smaller phrase technology. Any transcriptional errors that result from this process are unintentional.

## 2017-07-01 NOTE — Code Documentation (Signed)
Family walked to family consult room by this RN.

## 2017-07-01 NOTE — ED Notes (Signed)
Patient noted to have involuntary muscle twitches to lower extremities. MD notified and at bedside.

## 2017-07-01 NOTE — ED Notes (Signed)
Pharmacy called and notified of need of fentanyl drip. States they are going to walk it down now.

## 2017-07-02 DIAGNOSIS — J441 Chronic obstructive pulmonary disease with (acute) exacerbation: Secondary | ICD-10-CM

## 2017-07-02 LAB — CBC
HCT: 37.6 % (ref 35.0–47.0)
Hemoglobin: 12.1 g/dL (ref 12.0–16.0)
MCH: 29.4 pg (ref 26.0–34.0)
MCHC: 32.2 g/dL (ref 32.0–36.0)
MCV: 91.3 fL (ref 80.0–100.0)
PLATELETS: 236 10*3/uL (ref 150–440)
RBC: 4.12 MIL/uL (ref 3.80–5.20)
RDW: 16.5 % — AB (ref 11.5–14.5)
WBC: 16.3 10*3/uL — ABNORMAL HIGH (ref 3.6–11.0)

## 2017-07-02 LAB — BASIC METABOLIC PANEL
Anion gap: 7 (ref 5–15)
BUN: 23 mg/dL — AB (ref 6–20)
CALCIUM: 8.5 mg/dL — AB (ref 8.9–10.3)
CO2: 29 mmol/L (ref 22–32)
CREATININE: 0.77 mg/dL (ref 0.44–1.00)
Chloride: 105 mmol/L (ref 101–111)
GFR calc Af Amer: >60 mL/min (ref >60)
GFR calc non Af Amer: >60 mL/min (ref >60)
GLUCOSE: 144 mg/dL — AB (ref 65–99)
Potassium: 4.7 mmol/L (ref 3.5–5.1)
Sodium: 141 mmol/L (ref 135–145)

## 2017-07-02 LAB — GLUCOSE, CAPILLARY
Glucose-Capillary: 128 mg/dL — ABNORMAL HIGH (ref 65–99)
Glucose-Capillary: 141 mg/dL — ABNORMAL HIGH (ref 65–99)
Glucose-Capillary: 126 mg/dL — ABNORMAL HIGH (ref 65–99)

## 2017-07-02 LAB — PROCALCITONIN: Procalcitonin: <0.1 ng/mL

## 2017-07-02 MED ORDER — BUDESONIDE 0.25 MG/2ML IN SUSP
0.2500 mg | Freq: Four times a day (QID) | RESPIRATORY_TRACT | Status: DC
  Administered 2017-07-02 – 2017-07-04 (×8): 0.25 mg via RESPIRATORY_TRACT
  Filled 2017-07-02 (×9): qty 2

## 2017-07-02 MED ORDER — LORAZEPAM 2 MG/ML IJ SOLN
0.5000 mg | INTRAMUSCULAR | Status: DC | PRN
  Administered 2017-07-02: 0.5 mg via INTRAVENOUS
  Filled 2017-07-02: qty 1

## 2017-07-02 MED ORDER — SUMATRIPTAN SUCCINATE 25 MG PO TABS
25.0000 mg | ORAL_TABLET | Freq: Once | ORAL | Status: DC
  Filled 2017-07-02: qty 1

## 2017-07-02 MED ORDER — ENOXAPARIN SODIUM 40 MG/0.4ML ~~LOC~~ SOLN
40.0000 mg | SUBCUTANEOUS | Status: DC
  Filled 2017-07-02: qty 0.4

## 2017-07-02 MED ORDER — ALBUTEROL SULFATE (2.5 MG/3ML) 0.083% IN NEBU
2.5000 mg | INHALATION_SOLUTION | RESPIRATORY_TRACT | Status: DC | PRN
  Administered 2017-07-02 – 2017-07-03 (×2): 2.5 mg via RESPIRATORY_TRACT
  Filled 2017-07-02 (×3): qty 3

## 2017-07-02 MED ORDER — METOCLOPRAMIDE HCL 5 MG/ML IJ SOLN
5.0000 mg | INTRAMUSCULAR | Status: AC
  Administered 2017-07-02: 5 mg via INTRAVENOUS
  Filled 2017-07-02: qty 2

## 2017-07-02 MED ORDER — LORAZEPAM 2 MG/ML IJ SOLN
0.5000 mg | INTRAMUSCULAR | Status: AC
  Administered 2017-07-02: 0.5 mg via INTRAVENOUS

## 2017-07-02 MED ORDER — IPRATROPIUM-ALBUTEROL 0.5-2.5 (3) MG/3ML IN SOLN
3.0000 mL | Freq: Four times a day (QID) | RESPIRATORY_TRACT | Status: DC
  Administered 2017-07-02 – 2017-07-04 (×8): 3 mL via RESPIRATORY_TRACT
  Filled 2017-07-02 (×9): qty 3

## 2017-07-02 MED ORDER — ONDANSETRON HCL 4 MG/2ML IJ SOLN
4.0000 mg | INTRAMUSCULAR | Status: DC | PRN
  Administered 2017-07-02 (×2): 4 mg via INTRAVENOUS
  Filled 2017-07-02 (×2): qty 2

## 2017-07-02 MED ORDER — ACETAMINOPHEN 325 MG PO TABS
650.0000 mg | ORAL_TABLET | Freq: Four times a day (QID) | ORAL | Status: DC | PRN
  Administered 2017-07-02 – 2017-07-03 (×2): 650 mg via ORAL
  Filled 2017-07-02 (×3): qty 2

## 2017-07-02 MED ORDER — LORAZEPAM 2 MG/ML IJ SOLN
INTRAMUSCULAR | Status: AC
  Administered 2017-07-02: 0.5 mg via INTRAVENOUS
  Filled 2017-07-02: qty 1

## 2017-07-02 MED ORDER — METHYLPREDNISOLONE SODIUM SUCC 40 MG IJ SOLR
40.0000 mg | Freq: Two times a day (BID) | INTRAMUSCULAR | Status: AC
  Administered 2017-07-02 – 2017-07-03 (×3): 40 mg via INTRAVENOUS
  Filled 2017-07-02 (×3): qty 1

## 2017-07-02 MED ORDER — DEXTROSE-NACL 5-0.45 % IV SOLN
INTRAVENOUS | Status: AC
  Administered 2017-07-02: 10:00:00 via INTRAVENOUS

## 2017-07-02 MED ORDER — ORAL CARE MOUTH RINSE
15.0000 mL | Freq: Two times a day (BID) | OROMUCOSAL | Status: DC
  Administered 2017-07-04: 15 mL via OROMUCOSAL

## 2017-07-02 MED ORDER — KETOROLAC TROMETHAMINE 15 MG/ML IJ SOLN
15.0000 mg | INTRAMUSCULAR | Status: AC
  Administered 2017-07-02: 15 mg via INTRAVENOUS
  Filled 2017-07-02: qty 1

## 2017-07-02 MED ORDER — FENTANYL CITRATE (PF) 100 MCG/2ML IJ SOLN
12.5000 ug | Freq: Once | INTRAMUSCULAR | Status: AC
  Administered 2017-07-02: 12.5 ug via INTRAVENOUS
  Filled 2017-07-02: qty 2

## 2017-07-02 NOTE — Progress Notes (Signed)
PULMONARY / CRITICAL CARE MEDICINE   Name: Caitlyn Evans MRN: [id number] DOB: 1966-04-27    ADMISSION DATE:  07/01/2017  REFERRING MD:  Dr. Elisabeth PigeonVachhani  CHIEF COMPLAINT:  Respiratory Distress  PT PROFILE:  [age] F smoker with history COPD, BOOP followed as outpt by Dr Nicholos Johnsamachandran admitted 10/16 via ED with respiratory distress, failed BiPAP and was intubated.   MAJOR EVENTS/TEST RESULTS: 10/16 Intubated for COPD exacerbation 10/17 extubated  INDWELLING DEVICES:: ETT 10/16 >> 10/17  MICRO DATA: MRSA PCR 10/16 >> NEG Resp 10/17 >>  Blood 10/17 >>   ANTIMICROBIALS:  Levofloxacin 10/16 >>     SUBJECTIVE:  Passed SBT. Awake and alert. + F/C.   VITAL SIGNS: BP 105/68 (BP Location: Right Arm)   Pulse 83   Temp 98.8 F (37.1 C) (Core (Comment))   Resp 12   Ht 5' (1.524 m)   Wt 54 kg (119 lb 0.8 oz)   SpO2 98%   BMI 23.25 kg/m   HEMODYNAMICS:    VENTILATOR SETTINGS: Vent Mode: PRVC FiO2 (%):  [30 %-50 %] 30 % Set Rate:  [20 bmp] 20 bmp Vt Set:  [400 mL] 400 mL PEEP:  [5 cmH20] 5 cmH20 Plateau Pressure:  [12 cmH20-13 cmH20] 13 cmH20  INTAKE / OUTPUT: I/O last 3 completed shifts: In: 50 [IV Piggyback:50] Out: 250 [Urine:250]  PHYSICAL EXAMINATION: General: intubated, RASS 0, + F/C. Neuro: no focal deficits HEENT: NCAT, sclerae white Cardiovascular: tachycardia, regular, no M Lungs: diffuse coarse wheezes Abdomen: soft, NABS Extremities: warm, no edema  LABS:  BMET  Recent Labs Lab 07/01/17 1719 07/02/17 0311  NA 144 141  K 4.3 4.7  CL 103 105  CO2 27 29  BUN 17 23*  CREATININE 0.65 0.77  GLUCOSE 101* 144*    Electrolytes  Recent Labs Lab 07/01/17 1719 07/02/17 0311  CALCIUM 9.0 8.5*    CBC  Recent Labs Lab 07/01/17 1719 07/02/17 0311  WBC 10.1 16.3*  HGB 14.5 12.1  HCT 44.2 37.6  PLT 271 236    Coag's No results for input(s): APTT, INR in the last 168 hours.  Sepsis Markers  Recent Labs Lab 07/02/17 0311   PROCALCITON <0.10    ABG No results for input(s): PHART, PCO2ART, PO2ART in the last 168 hours.  Liver Enzymes No results for input(s): AST, ALT, ALKPHOS, BILITOT, ALBUMIN in the last 168 hours.  Cardiac Enzymes  Recent Labs Lab 07/01/17 1719  TROPONINI <0.03    Glucose  Recent Labs Lab 07/01/17 2121 07/02/17 0744 07/02/17 1140  GLUCAP 117* 128* 141*    CXR: no acute findings    ASSESSMENT / PLAN:  PULMONARY A: Acute On Chronic Respiratory failure COPD Exacerbation smoker P:   Extubated this morning under my direction Supplemental oxygen to maintain SPO2 greater than 92% Continue nebulized steroids and bronchodilators Continue systemic steroids Continue empiric antibiotics Monitor in ICU/SDU through today  CARDIOVASCULAR A:  Hx of CHF/ MI/ Hypertension P:  monitor  RENAL A:   No active issues P:   Monitor BMET intermittently Monitor I/Os Correct electrolytes as indicated   GASTROINTESTINAL A:   No active issues P:   SUP: N/I post extubation Begin diet later today if tolerating extubation  HEMATOLOGIC A:   No active issues P:  DVT px: enoxaparin Monitor CBC intermittently Transfuse per usual guidelines   INFECTIOUS A:   Acute bronchitis P:   Monitor temp, WBC count Micro and abx as above   ENDOCRINE A:   No active  issues P:   Monitor Blood glucose intermittently with BMP  NEUROLOGIC A:  No current issues P:   RASS goal: 0 Monitor status   Billy Fischer, MD PCCM service Mobile [phone] Pager [phone] 07/02/2017 2:21 PM

## 2017-07-02 NOTE — Progress Notes (Signed)
Patient is placed on high fowlers position, cuff deflated suctioned orally and endotracheally and then extubated to 3 lpm o2 

## 2017-07-02 NOTE — Progress Notes (Signed)
Pt extubated 0909 AM to 4 liters, pt had extensive upper body tremors and appeared quite anxious after extubation, although she stated she was very relieved to have ETT out.  Headache reported, and mild regurgitation with small amt blood.  PT sustained and ate most of her lunch, but afterward she experienced more pronounced regurgitation and was very anxious with tremors and migraine.  Administered alb breathing tx, toradol, and ativan.  Pt reported she felt a little better but still felt SOB.  O2 sats during this time were >95%.  When asked re BiPap she responded she would like to try for a while.  BiPap started at 30%, she requested more pressure; RT increased to 14 over 5, although O2 sats were WDL.  After a couple of hours writer entered room and spouse was placing her back on [place].  She stated she felt ok but did not want to eat at this time, requested water.     Writer observed spouse emptying bedpan and flushing contents; Spouse instructed to leave urine in bedpan so staff can measure.

## 2017-07-02 NOTE — Progress Notes (Signed)
Pharmacy Antibiotic Note  Caitlyn Evans is a 51 y.o. female admitted on 07/01/2017 with COPD exacerbation.  Pharmacy has been consulted for levofloxacin dosing.  Plan: Continue levofloxacin 500 mg IV Q 24 hours. Continue to monitor and adjust per cultures.  Height: 5' (152.4 cm) Weight: 119 lb 0.8 oz (54 kg) IBW/kg (Calculated) : 45.5  Temp (24hrs), Avg:98.4 F (36.9 C), Min:96.2 F (35.7 C), Max:99.5 F (37.5 C)   Recent Labs Lab 07/01/17 1719 07/02/17 0311  WBC 10.1 16.3*  CREATININE 0.65 0.77    Estimated Creatinine Clearance: 58.4 mL/min (by C-G formula based on SCr of 0.77 mg/dL).    Allergies  Allergen Reactions  . Penicillins Hives and Other (See Comments)    Has patient had a PCN reaction causing immediate rash, facial/tongue/throat swelling, SOB or lightheadedness with hypotension: No Has patient had a PCN reaction causing severe rash involving mucus membranes or skin necrosis: No Has patient had a PCN reaction that required hospitalization No Has patient had a PCN reaction occurring within the last 10 years: No If all of the above answers are "NO", then may proceed with Cephalosporin use.  . Codeine Hives and Nausea And Vomiting    Antimicrobials this admission: Levofloxacin 10/16 >>    Dose adjustments this admission:   Microbiology results: 10/16 BCx: no growth <24 hours 10/16 MRSA PCR: negative  Thank you for allowing pharmacy to be a part of this patient's care.  Greer PickerelJessica Majestic Molony 07/02/2017 12:26 PM

## 2017-07-03 DIAGNOSIS — F17200 Nicotine dependence, unspecified, uncomplicated: Secondary | ICD-10-CM

## 2017-07-03 DIAGNOSIS — J9622 Acute and chronic respiratory failure with hypercapnia: Secondary | ICD-10-CM

## 2017-07-03 DIAGNOSIS — J9621 Acute and chronic respiratory failure with hypoxia: Secondary | ICD-10-CM

## 2017-07-03 LAB — HIV ANTIBODY (ROUTINE TESTING W REFLEX): HIV SCREEN 4TH GENERATION: NONREACTIVE

## 2017-07-03 MED ORDER — DOXYCYCLINE HYCLATE 100 MG PO TABS
100.0000 mg | ORAL_TABLET | Freq: Two times a day (BID) | ORAL | Status: DC
  Administered 2017-07-03 – 2017-07-04 (×3): 100 mg via ORAL
  Filled 2017-07-03 (×3): qty 1

## 2017-07-03 MED ORDER — CHLORHEXIDINE GLUCONATE 0.12 % MT SOLN: 15.0000 mL | Freq: Two times a day (BID) | OROMUCOSAL | Status: DC

## 2017-07-03 MED ORDER — FENTANYL CITRATE (PF) 100 MCG/2ML IJ SOLN
12.5000 ug | INTRAMUSCULAR | Status: AC
  Administered 2017-07-03: 12.5 ug via INTRAVENOUS
  Filled 2017-07-03: qty 2

## 2017-07-03 MED ORDER — PHENOL 1.4 % MT LIQD
1.0000 | OROMUCOSAL | Status: DC | PRN
  Administered 2017-07-03: 1 via OROMUCOSAL
  Filled 2017-07-03: qty 177

## 2017-07-03 MED ORDER — HYDROCOD POLST-CPM POLST ER 10-8 MG/5ML PO SUER
5.0000 mL | Freq: Two times a day (BID) | ORAL | Status: DC | PRN
  Administered 2017-07-03 – 2017-07-04 (×3): 5 mL via ORAL
  Filled 2017-07-03 (×6): qty 5

## 2017-07-03 MED ORDER — ALPRAZOLAM 0.25 MG PO TABS
0.2500 mg | ORAL_TABLET | Freq: Three times a day (TID) | ORAL | Status: DC | PRN
  Administered 2017-07-03 – 2017-07-04 (×3): 0.25 mg via ORAL
  Filled 2017-07-03 (×3): qty 1

## 2017-07-03 MED ORDER — ORAL CARE MOUTH RINSE: 15.0000 mL | Freq: Two times a day (BID) | OROMUCOSAL | Status: DC

## 2017-07-03 MED ORDER — PREDNISONE 20 MG PO TABS
40.0000 mg | ORAL_TABLET | Freq: Every day | ORAL | Status: DC
Start: 2017-07-04 — End: 2017-07-04
  Administered 2017-07-04: 40 mg via ORAL
  Filled 2017-07-03: qty 2

## 2017-07-03 NOTE — Progress Notes (Signed)
[facility] [place] [facility]   PATIENT NAME: Caitlyn Evans    MR#:  [id number]  DATE OF BIRTH:  16-Aug-1966  SUBJECTIVE:  CHIEF COMPLAINT:   Chief Complaint  Patient presents with  . Respiratory Distress     Brought with respi distress, intubated in ER- have been extubated 07/02/17 morning, on nasal canula, some distress.  REVIEW OF SYSTEMS:  CONSTITUTIONAL: No fever, fatigue or weakness.  EYES: No blurred or double vision.  EARS, NOSE, AND THROAT: No tinnitus or ear pain.  RESPIRATORY: No cough, positive for shortness of breath, wheezing , no hemoptysis.  CARDIOVASCULAR: No chest pain, orthopnea, edema.  GASTROINTESTINAL: No nausea, vomiting, diarrhea or abdominal pain.  GENITOURINARY: No dysuria, hematuria.  ENDOCRINE: No polyuria, nocturia,  HEMATOLOGY: No anemia, easy bruising or bleeding SKIN: No rash or lesion. MUSCULOSKELETAL: No joint pain or arthritis.   NEUROLOGIC: No tingling, numbness, weakness.  PSYCHIATRY: No anxiety or depression.   ROS  DRUG ALLERGIES:   Allergies  Allergen Reactions  . Penicillins Hives and Other (See Comments)    Has patient had a PCN reaction causing immediate rash, facial/tongue/throat swelling, SOB or lightheadedness with hypotension: No Has patient had a PCN reaction causing severe rash involving mucus membranes or skin necrosis: No Has patient had a PCN reaction that required hospitalization No Has patient had a PCN reaction occurring within the last 10 years: No If all of the above answers are "NO", then may proceed with Cephalosporin use.  . Codeine Hives and Nausea And Vomiting    VITALS:  Blood pressure (!) 92/55, pulse (!) 115, temperature 97.8 F (36.6 C), temperature source Axillary, resp. rate (!) 27, height 5' (1.524 m), weight 54 kg (119 lb 0.8 oz), SpO2 96 %.  PHYSICAL EXAMINATION:  GENERAL:  51 y.o.-year-old patient lying in the bed with no acute distress.  EYES: Pupils equal, round, reactive  to light and accommodation. No scleral icterus. Extraocular muscles intact.  HEENT: Head atraumatic, normocephalic. Oropharynx and nasopharynx clear.  NECK:  Supple, no jugular venous distention. No thyroid enlargement, no tenderness.  LUNGS: Normal breath sounds bilaterally, have wheezing, no crepitation. Positive for use of accessory muscles of respiration.  CARDIOVASCULAR: S1, S2 normal. No murmurs, rubs, or gallops.  ABDOMEN: Soft, nontender, nondistended. Bowel sounds present. No organomegaly or mass.  EXTREMITIES: No pedal edema, cyanosis, or clubbing.  NEUROLOGIC: Cranial nerves II through XII are intact. Muscle strength 5/5 in all extremities. Sensation intact. Gait not checked.  PSYCHIATRIC: The patient is alert and oriented x 3.  SKIN: No obvious rash, lesion, or ulcer.   Physical Exam LABORATORY PANEL:   CBC  Recent Labs Lab 07/02/17 0311  WBC 16.3*  HGB 12.1  HCT 37.6  PLT 236   ------------------------------------------------------------------------------------------------------------------  Chemistries   Recent Labs Lab 07/02/17 0311  NA 141  K 4.7  CL 105  CO2 29  GLUCOSE 144*  BUN 23*  CREATININE 0.77  CALCIUM 8.5*   ------------------------------------------------------------------------------------------------------------------  Cardiac Enzymes  Recent Labs Lab 07/01/17 1719  TROPONINI <0.03   ------------------------------------------------------------------------------------------------------------------  RADIOLOGY:  Dg Abd 1 View  Result Date: 07/01/2017 CLINICAL DATA:  OG tube placement EXAM: ABDOMEN - 1 VIEW COMPARISON:  07/01/2017 FINDINGS: Enteric tube tip is in the left mid abdomen consistent with location in the distal stomach. Visualized bowel gas pattern is unremarkable with scattered stool in the colon. IMPRESSION: Enteric tube tip is in the left mid abdomen consistent with location in the distal stomach. Electronically Signed  By:  Burman Nieves M.D.   On: 07/01/2017 22:18   Dg Abdomen 1 View  Result Date: 07/01/2017 CLINICAL DATA:  OG tube placement EXAM: ABDOMEN - 1 VIEW COMPARISON:  None. FINDINGS: Esophageal tube tip projects over the mid stomach, side-port projects over gastric fundus. Nonobstructed bowel-gas pattern with moderate stool. IMPRESSION: Esophageal tube tip and side-port project over the mid to proximal stomach. Non obstructed gas pattern. Electronically Signed   By: Jasmine Pang M.D.   On: 07/01/2017 19:34   Dg Chest Portable 1 View  Result Date: 07/01/2017 CLINICAL DATA:  Post intubation EXAM: PORTABLE CHEST 1 VIEW COMPARISON:  07/01/2017, 04/15/2017 FINDINGS: Endotracheal tube tip is about 3.2 cm superior to the carina. Esophageal tube tip is below the diaphragm. Biapical pleural and parenchymal scarring. No acute infiltrate or effusion. Normal cardiomediastinal silhouette with atherosclerosis. No pneumothorax. IMPRESSION: 1. Endotracheal tube tip about 3.2 cm superior to the carina 2. Negative for edema or infiltrate. Electronically Signed   By: Jasmine Pang M.D.   On: 07/01/2017 19:36   Dg Chest Portable 1 View  Result Date: 07/01/2017 CLINICAL DATA:  Short of breath EXAM: PORTABLE CHEST 1 VIEW COMPARISON:  06/23/2017 FINDINGS: Heart size within normal limits. Minimal atherosclerosis. No acute consolidation or effusion. Negative for a pneumothorax. IMPRESSION: No active disease. Electronically Signed   By: Jasmine Pang M.D.   On: 07/01/2017 17:38    ASSESSMENT AND PLAN:   Principal Problem:   Acute on chronic respiratory failure (HCC) Active Problems:   Acute exacerbation of chronic obstructive pulmonary disease (COPD) (HCC)   Acute respiratory failure (HCC)  * acute on chronic respiratory failure   Acute exacerbation of COPD   Acute bronchitis    Intubated, ventilatory support.   Intensivist to help management.   Extubated on 07/02/17.   iV steroid, inhaled steroid, nebulized  bronchodilator.   IV antibiotics.   Monitor in ICU today, as may require bipap.  * anxiety   As needed IV Ativan.   All the records are reviewed and case discussed with Care Management/Social Workerr. Management plans discussed with the patient, family and they are in agreement.  CODE STATUS: Full.  TOTAL TIME TAKING CARE OF THIS PATIENT: 35 minutes.     POSSIBLE D/C IN 1-2 DAYS, DEPENDING ON CLINICAL CONDITION.   Altamese Dilling M.D on 07/03/2017   Between 7am to 6pm - Pager - [phone]  After 6pm go to [email or web address] - password [employer]  [facility]  Office  [phone]  CC: Primary care physician; Corky Downs, MD  Note: This dictation was prepared with Dragon dictation along with smaller phrase technology. Any transcriptional errors that result from this process are unintentional.

## 2017-07-03 NOTE — Progress Notes (Signed)
PULMONARY / CRITICAL CARE MEDICINE   Name: Caitlyn Evans MRN: [id number] DOB: [birth year]    ADMISSION DATE:  07/01/2017  REFERRING MD:  Dr. Elisabeth PigeonVachhani  CHIEF COMPLAINT:  Respiratory Distress  PT PROFILE:  [age] F smoker with history COPD, BOOP followed as outpt by Dr Nicholos Johnsamachandran admitted 10/16 via ED with respiratory distress, failed BiPAP and was intubated.   MAJOR EVENTS/TEST RESULTS: 10/16 Intubated for COPD exacerbation 10/17 extubated  INDWELLING DEVICES:: ETT 10/16 >> 10/17  MICRO DATA: MRSA PCR 10/16 >> NEG Resp 10/17 >>  Blood 10/17 >>   ANTIMICROBIALS:  Levofloxacin 10/16 >> 10/18 Doxycycline 10/18 >>  Days planned   SUBJECTIVE:  No distress. No new complaints. Has not yet been out of bed.  VITAL SIGNS: BP 105/68 (BP Location: Right Arm)   Pulse 83   Temp 98.8 F (37.1 C) (Core (Comment))   Resp 12   Ht 5' (1.524 m)   Wt 54 kg (119 lb 0.8 oz)   SpO2 98%   BMI 23.25 kg/m   HEMODYNAMICS:    VENTILATOR SETTINGS: Vent Mode: PRVC FiO2 (%):  [30 %-50 %] 30 % Set Rate:  [20 bmp] 20 bmp Vt Set:  [400 mL] 400 mL PEEP:  [5 cmH20] 5 cmH20 Plateau Pressure:  [12 cmH20-13 cmH20] 13 cmH20  INTAKE / OUTPUT: I/O last 3 completed shifts: In: 150 [IV Piggyback:50] Out: 250 [Urine:250]  PHYSICAL EXAMINATION: General: NAD, cognition intact Neuro: no focal deficits HEENT: NCAT, sclerae white Cardiovascular: Regular, no M Lungs: scattered wheezes Abdomen: soft, NABS Extremities: warm, no edema  BMP Latest Ref Rng & Units 07/02/2017 07/01/2017 06/23/2017  Glucose 65 - 99 mg/dL 914(N144(H) 829(F101(H) 86  BUN 6 - 20 mg/dL 62(Z23(H) 17 20  Creatinine 0.44 - 1.00 mg/dL 3.080.77 6.570.65 8.460.85  Sodium 135 - 145 mmol/L 141 144 144  Potassium 3.5 - 5.1 mmol/L 4.7 4.3 3.7  Chloride 101 - 111 mmol/L 105 103 110  CO2 22 - 32 mmol/L 29 27 25   Calcium 8.9 - 10.3 mg/dL 9.6(E8.5(L) 9.0 9.0   CBC Latest Ref Rng & Units 07/02/2017 07/01/2017 06/23/2017  WBC 3.6 - 11.0 K/uL 16.3(H) 10.1 9.7   Hemoglobin 12.0 - 16.0 g/dL 95.212.1 84.114.5 32.413.8  Hematocrit 35.0 - 47.0 % 37.6 44.2 40.7  Platelets 150 - 440 K/uL 236 271 267      CXR: no new film   ASSESSMENT / PLAN: Acute On Chronic Respiratory failure COPD Exacerbation Recalcitrant smoker P:   Continue supplemental oxygen Continue nebulized steroids and bronchodilators Continue methylprednisolone at current dose through today, then initiate prednisone 10/19 Change antibiotics to doxycycline - 5 days ordered [employer] Counseled extensively regarding smoking cessation including strategies  Recommended nicotine replacement therapy Transfer to MedSurg floor Likely DC home 10/19  As an outpatient, she is followed by Dr. Meredeth IdeFleming  After transfer, PCCM will sign off. Please call if we can be of further assistance   Billy Fischeravid Simonds, MD PCCM service Mobile [phone] Pager [phone] 07/03/2017 11:23 AM

## 2017-07-03 NOTE — Progress Notes (Signed)
[facility] -  [facility]   PATIENT NAME: Caitlyn Evans    MR#:  [id number]  DATE OF BIRTH:  07/16/1966  SUBJECTIVE:  CHIEF COMPLAINT:   Chief Complaint  Patient presents with  . Respiratory Distress     Brought with respi distress, intubated in ER- have been extubated 07/02/17 morning, on nasal canula, no distress.  REVIEW OF SYSTEMS:  CONSTITUTIONAL: No fever, fatigue or weakness.  EYES: No blurred or double vision.  EARS, NOSE, AND THROAT: No tinnitus or ear pain.  RESPIRATORY: No cough, positive for shortness of breath, wheezing , no hemoptysis.  CARDIOVASCULAR: No chest pain, orthopnea, edema.  GASTROINTESTINAL: No nausea, vomiting, diarrhea or abdominal pain.  GENITOURINARY: No dysuria, hematuria.  ENDOCRINE: No polyuria, nocturia,  HEMATOLOGY: No anemia, easy bruising or bleeding SKIN: No rash or lesion. MUSCULOSKELETAL: No joint pain or arthritis.   NEUROLOGIC: No tingling, numbness, weakness.  PSYCHIATRY: No anxiety or depression.   ROS  DRUG ALLERGIES:   Allergies  Allergen Reactions  . Penicillins Hives and Other (See Comments)    Has patient had a PCN reaction causing immediate rash, facial/tongue/throat swelling, SOB or lightheadedness with hypotension: No Has patient had a PCN reaction causing severe rash involving mucus membranes or skin necrosis: No Has patient had a PCN reaction that required hospitalization No Has patient had a PCN reaction occurring within the last 10 years: No If all of the above answers are "NO", then may proceed with Cephalosporin use.  . Codeine Hives and Nausea And Vomiting    VITALS:  Blood pressure 108/65, pulse 87, temperature 98.9 F (37.2 C), temperature source Oral, resp. rate 18, height 5' (1.524 m), weight 54 kg (119 lb 0.8 oz), SpO2 100 %.  PHYSICAL EXAMINATION:  GENERAL:  51 y.o.-year-old patient lying in the bed with no acute distress.  EYES: Pupils equal, round, reactive to light and  accommodation. No scleral icterus. Extraocular muscles intact.  HEENT: Head atraumatic, normocephalic. Oropharynx and nasopharynx clear.  NECK:  Supple, no jugular venous distention. No thyroid enlargement, no tenderness.  LUNGS: Normal breath sounds bilaterally, have some wheezing, no crepitation. no use of accessory muscles of respiration.  CARDIOVASCULAR: S1, S2 normal. No murmurs, rubs, or gallops.  ABDOMEN: Soft, nontender, nondistended. Bowel sounds present. No organomegaly or mass.  EXTREMITIES: No pedal edema, cyanosis, or clubbing.  NEUROLOGIC: Cranial nerves II through XII are intact. Muscle strength 5/5 in all extremities. Sensation intact. Gait not checked.  PSYCHIATRIC: The patient is alert and oriented x 3.  SKIN: No obvious rash, lesion, or ulcer.   Physical Exam LABORATORY PANEL:   CBC  Recent Labs Lab 07/02/17 0311  WBC 16.3*  HGB 12.1  HCT 37.6  PLT 236   ------------------------------------------------------------------------------------------------------------------  Chemistries   Recent Labs Lab 07/02/17 0311  NA 141  K 4.7  CL 105  CO2 29  GLUCOSE 144*  BUN 23*  CREATININE 0.77  CALCIUM 8.5*   ------------------------------------------------------------------------------------------------------------------  Cardiac Enzymes  Recent Labs Lab 07/01/17 1719  TROPONINI <0.03   ------------------------------------------------------------------------------------------------------------------  RADIOLOGY:  Dg Abd 1 View  Result Date: 07/01/2017 CLINICAL DATA:  OG tube placement EXAM: ABDOMEN - 1 VIEW COMPARISON:  07/01/2017 FINDINGS: Enteric tube tip is in the left mid abdomen consistent with location in the distal stomach. Visualized bowel gas pattern is unremarkable with scattered stool in the colon. IMPRESSION: Enteric tube tip is in the left mid abdomen consistent with location in the distal stomach. Electronically Signed   By: Chrissie NoaWilliam  Andria Meuse  M.D.   On: 07/01/2017 22:18    ASSESSMENT AND PLAN:   Principal Problem:   Acute on chronic respiratory failure (HCC) Active Problems:   Acute exacerbation of chronic obstructive pulmonary disease (COPD) (HCC)   Acute respiratory failure (HCC)  * acute on chronic respiratory failure   Acute exacerbation of COPD   Acute bronchitis    Intubated, ventilatory support.   Intensivist to help management.   Extubated on 07/02/17.   iV steroid, inhaled steroid, nebulized bronchodilator.   IV antibiotics.   Transfer out of ICU, cont current treatment.   Encourage to ambulate, will try to taper oxygen.  * anxiety   As needed IV Ativan.   All the records are reviewed and case discussed with Care Management/Social Workerr. Management plans discussed with the patient, family and they are in agreement.  CODE STATUS: Full.  TOTAL TIME TAKING CARE OF THIS PATIENT: 35 minutes.    POSSIBLE D/C IN 1-2 DAYS, DEPENDING ON CLINICAL CONDITION.   Altamese Dilling M.D on 07/03/2017   Between 7am to 6pm - Pager - [phone]  After 6pm go to [email or web address] - password [employer]  [facility]  Office  [phone]  CC: Primary care physician; Corky Downs, MD  Note: This dictation was prepared with Dragon dictation along with smaller phrase technology. Any transcriptional errors that result from this process are unintentional.

## 2017-07-04 MED ORDER — BUDESONIDE 0.25 MG/2ML IN SUSP: 0.2500 mg | Freq: Four times a day (QID) | RESPIRATORY_TRACT | 0 refills | Status: DC

## 2017-07-04 MED ORDER — DOXYCYCLINE HYCLATE 100 MG PO TABS: 100.0000 mg | ORAL_TABLET | Freq: Two times a day (BID) | ORAL | 0 refills | Status: AC

## 2017-07-04 MED ORDER — SUMATRIPTAN SUCCINATE 50 MG PO TABS
50.0000 mg | ORAL_TABLET | Freq: Once | ORAL | Status: AC
  Administered 2017-07-04: 50 mg via ORAL
  Filled 2017-07-04: qty 1

## 2017-07-04 MED ORDER — HYDROCOD POLST-CPM POLST ER 10-8 MG/5ML PO SUER
5.0000 mL | Freq: Once | ORAL | Status: AC
  Administered 2017-07-04: 5 mL via ORAL

## 2017-07-04 MED ORDER — PREDNISONE 10 MG (21) PO TBPK: ORAL_TABLET | ORAL | 0 refills | Status: DC

## 2017-07-04 NOTE — Progress Notes (Signed)
Caitlyn Evans to be D/C'd homeper MD order.  Discussed prescriptions and follow up appointments with the patient. Prescriptions given to patient, medication list explained in detail. Pt verbalized understanding. Pt requesting one more dose of Immitrex and Tussionex as her cough and HA have worsened, orders received from MD  Allergies as of 07/04/2017      Reactions   Penicillins Hives, Other (See Comments)   Has patient had a PCN reaction causing immediate rash, facial/tongue/throat swelling, SOB or lightheadedness with hypotension: No Has patient had a PCN reaction causing severe rash involving mucus membranes or skin necrosis: No Has patient had a PCN reaction that required hospitalization No Has patient had a PCN reaction occurring within the last 10 years: No If all of the above answers are "NO", then may proceed with Cephalosporin use.   Codeine Hives, Nausea And Vomiting      Medication List    STOP taking these medications   levofloxacin 500 MG tablet Commonly known as:  LEVAQUIN     TAKE these medications   ALPRAZolam 0.25 MG tablet Commonly known as:  XANAX Take 0.25 mg by mouth 2 (two) times daily.   budesonide 0.25 MG/2ML nebulizer solution Commonly known as:  PULMICORT Take 2 mLs (0.25 mg total) by nebulization every 6 (six) hours.   donepezil 10 MG tablet Commonly known as:  ARICEPT Take 10 mg by mouth daily.   doxycycline 100 MG tablet Commonly known as:  VIBRA-TABS Take 1 tablet (100 mg total) by mouth every 12 (twelve) hours.   ipratropium-albuterol 0.5-2.5 (3) MG/3ML Soln Commonly known as:  DUONEB Inhale 3 mLs into the lungs every 4 (four) hours as needed (for wheezing/shortness of breath). What changed:  reasons to take this  additional instructions   MULTIVITAMIN GUMMIES ADULT Chew Chew 2 each by mouth daily.   pantoprazole 40 MG tablet Commonly known as:  PROTONIX Take 1 tablet (40 mg total) by mouth daily.   predniSONE 10 MG (21) Tbpk  tablet Commonly known as:  STERAPRED UNI-PAK 21 TAB Take 6 tabs first day, 5 tab on day 2, then 4 on day 3rd, 3 tabs on day 4th , 2 tab on day 5th, and 1 tab on 6th day. What changed:  how to take this  when to take this  additional instructions       Vitals:   07/04/17 0457 07/04/17 0743  BP: (!) 96/49   Pulse: 67   Resp: 20   Temp: 98.1 F (36.7 C)   SpO2: 99% 99%    Skin clean, dry and intact without evidence of skin break down, no evidence of skin tears noted. IV catheter discontinued intact. Site without signs and symptoms of complications. Dressing and pressure applied. Pt denies pain at this time. No complaints noted.  An After Visit Summary was printed and given to the patient. Patient escorted via WC, and D/C home via private auto.  Caitlyn Evans A

## 2017-07-04 NOTE — Care Management (Signed)
Patient to discharge home today.  Per nursing patient has her portable O2 tank for transport.

## 2017-07-04 NOTE — Care Management Important Message (Signed)
Important Message  Patient Details  Name: Caitlyn Evans MRN: [id number] Date of Birth: 22-Jun-1966   Medicare Important Message Given:  Yes    Chapman FitchBOWEN, Keval Nam T, RN 07/04/2017, 12:15 PM

## 2017-07-05 NOTE — Discharge Summary (Signed)
[facility] [place] at [facility]   PATIENT NAME: Caitlyn Evans    MR#:  [id number]  DATE OF BIRTH:  June 27, 1966  DATE OF ADMISSION:  07/01/2017 ADMITTING PHYSICIAN: Altamese Dilling, MD  DATE OF DISCHARGE: 07/04/2017  1:43 PM  PRIMARY CARE PHYSICIAN: Corky Downs, MD    ADMISSION DIAGNOSIS:  COPD exacerbation (HCC) [J44.1] Acute respiratory failure with hypercapnia (HCC) [J96.02]  DISCHARGE DIAGNOSIS:  Principal Problem:   Acute on chronic respiratory failure (HCC) Active Problems:   Acute exacerbation of chronic obstructive pulmonary disease (COPD) (HCC)   Acute respiratory failure (HCC)   SECONDARY DIAGNOSIS:   Past Medical History:  Diagnosis Date  . Anxiety    panic attacks  . Asthma   . BOOP (bronchiolitis obliterans with organizing pneumonia) (HCC)   . CHF (congestive heart failure) (HCC)   . Dysphagia, pharyngoesophageal phase 05/30/2015  . Emphysema lung (HCC) 05/30/2015  . Hypercholesterolemia   . Hypertension   . Lung mass   . Migraines   . Motion sickness    all moving vehicles  . Myocardial infarction (HCC) 2012  . Seasonal allergies   . Shortness of breath dyspnea    1 flight-stairs    HOSPITAL COURSE:   * acute on chronic respiratory failure Acute exacerbation of COPD Acute bronchitis  Intubated, ventilatory support. Intensivist to help management.   Extubated on 07/02/17. iV steroid, inhaled steroid, nebulized bronchodilator. IV antibiotics.   Transfer out of ICU, cont current treatment.   Encourage to ambulate, will try to taper oxygen.   Felt much better next day, able to walk , no hypoxia.  * anxiety  As needed IV Ativan.  * Tobaccou abuse   Counceleld to quit smoking for 4 min by me. DISCHARGE CONDITIONS:   Stable.  CONSULTS OBTAINED:    DRUG ALLERGIES:   Allergies  Allergen Reactions  . Penicillins Hives and Other (See Comments)    Has patient had a PCN reaction causing  immediate rash, facial/tongue/throat swelling, SOB or lightheadedness with hypotension: No Has patient had a PCN reaction causing severe rash involving mucus membranes or skin necrosis: No Has patient had a PCN reaction that required hospitalization No Has patient had a PCN reaction occurring within the last 10 years: No If all of the above answers are "NO", then may proceed with Cephalosporin use.  . Codeine Hives and Nausea And Vomiting    DISCHARGE MEDICATIONS:   Discharge Medication List as of 07/04/2017 11:52 AM    START taking these medications   Details  budesonide (PULMICORT) 0.25 MG/2ML nebulizer solution Take 2 mLs (0.25 mg total) by nebulization every 6 (six) hours., Starting Fri 07/04/2017, Print    doxycycline (VIBRA-TABS) 100 MG tablet Take 1 tablet (100 mg total) by mouth every 12 (twelve) hours., Starting Fri 07/04/2017, Until Tue 07/08/2017, Print      CONTINUE these medications which have CHANGED   Details  predniSONE (STERAPRED UNI-PAK 21 TAB) 10 MG (21) TBPK tablet Take 6 tabs first day, 5 tab on day 2, then 4 on day 3rd, 3 tabs on day 4th , 2 tab on day 5th, and 1 tab on 6th day., Print      CONTINUE these medications which have NOT CHANGED   Details  ALPRAZolam (XANAX) 0.25 MG tablet Take 0.25 mg by mouth 2 (two) times daily. , Historical Med    donepezil (ARICEPT) 10 MG tablet Take 10 mg by mouth daily. , Historical Med    ipratropium-albuterol (DUONEB) 0.5-2.5 (3)  MG/3ML SOLN Inhale 3 mLs into the lungs every 4 (four) hours as needed (for wheezing/shortness of breath)., Starting Mon 05/13/2016, Normal    Multiple Vitamins-Minerals (MULTIVITAMIN GUMMIES ADULT) CHEW Chew 2 each by mouth daily. , Historical Med    pantoprazole (PROTONIX) 40 MG tablet Take 1 tablet (40 mg total) by mouth daily., Starting Mon 05/13/2016, Normal      STOP taking these medications     levofloxacin (LEVAQUIN) 500 MG tablet          DISCHARGE INSTRUCTIONS:    Follow with  PMD in 1-2 weeks.  If you experience worsening of your admission symptoms, develop shortness of breath, life threatening emergency, suicidal or homicidal thoughts you must seek medical attention immediately by calling 911 or calling your MD immediately  if symptoms less severe.  You Must read complete instructions/literature along with all the possible adverse reactions/side effects for all the Medicines you take and that have been prescribed to you. Take any new Medicines after you have completely understood and accept all the possible adverse reactions/side effects.   Please note  You were cared for by a hospitalist during your hospital stay. If you have any questions about your discharge medications or the care you received while you were in the hospital after you are discharged, you can call the unit and asked to speak with the hospitalist on call if the hospitalist that took care of you is not available. Once you are discharged, your primary care physician will handle any further medical issues. Please note that NO REFILLS for any discharge medications will be authorized once you are discharged, as it is imperative that you return to your primary care physician (or establish a relationship with a primary care physician if you do not have one) for your aftercare needs so that they can reassess your need for medications and monitor your lab values.    Today   CHIEF COMPLAINT:   Chief Complaint  Patient presents with  . Respiratory Distress    HISTORY OF PRESENT ILLNESS:  Caitlyn Evans  is a 51 y.o. female with a known history of anxiety, asthma, CHF, hypertension, COPD, hypertension, current smoker, chronic, oxygen use- was in emergency room last week for her worsening shortness of breath and COPD, sent home with oral steroid and nebulizer prescriptions. In spite of using that she continued to feel worsening in her shortness of breath. Today she was not able to breathe at all and so brought  to the emergency room by family, noted to be in acute respiratory distress and started on BiPAP initially still continue to have tachypnea and respiratory distress so finally intubated by ER physician and given to hospitalist team for further management. Family denies any history of fever and chest x-ray does not show any pneumonia.   VITAL SIGNS:  Blood pressure (!) 96/49, pulse 67, temperature 98.1 F (36.7 C), temperature source Oral, resp. rate 20, height 5' (1.524 m), weight 54 kg (119 lb 0.8 oz), SpO2 99 %.  I/O:  No intake or output data in the 24 hours ending 07/05/17 1450  PHYSICAL EXAMINATION:  GENERAL:  51 y.o.-year-old patient lying in the bed with no acute distress.  EYES: Pupils equal, round, reactive to light and accommodation. No scleral icterus. Extraocular muscles intact.  HEENT: Head atraumatic, normocephalic. Oropharynx and nasopharynx clear.  NECK:  Supple, no jugular venous distention. No thyroid enlargement, no tenderness.  LUNGS: Normal breath sounds bilaterally, some wheezing, no crepitation. No use of accessory muscles  of respiration.  CARDIOVASCULAR: S1, S2 normal. No murmurs, rubs, or gallops.  ABDOMEN: Soft, non-tender, non-distended. Bowel sounds present. No organomegaly or mass.  EXTREMITIES: No pedal edema, cyanosis, or clubbing.  NEUROLOGIC: Cranial nerves II through XII are intact. Muscle strength 5/5 in all extremities. Sensation intact. Gait not checked.  PSYCHIATRIC: The patient is alert and oriented x 3.  SKIN: No obvious rash, lesion, or ulcer.   DATA REVIEW:   CBC  Recent Labs Lab 07/02/17 0311  WBC 16.3*  HGB 12.1  HCT 37.6  PLT 236    Chemistries   Recent Labs Lab 07/02/17 0311  NA 141  K 4.7  CL 105  CO2 29  GLUCOSE 144*  BUN 23*  CREATININE 0.77  CALCIUM 8.5*    Cardiac Enzymes  Recent Labs Lab 07/01/17 1719  TROPONINI <0.03    Microbiology Results  Results for orders placed or performed during the hospital  encounter of 07/01/17  Culture, blood (routine x 2)     Status: None (Preliminary result)   Collection Time: 07/01/17  5:19 PM  Result Value Ref Range Status   Specimen Description BLOOD RIGHT ANTECUBITAL  Final   Special Requests   Final    BOTTLES DRAWN AEROBIC AND ANAEROBIC Blood Culture adequate volume   Culture NO GROWTH 4 DAYS  Final   Report Status PENDING  Incomplete  Culture, blood (routine x 2)     Status: None (Preliminary result)   Collection Time: 07/01/17  5:19 PM  Result Value Ref Range Status   Specimen Description BLOOD LEFT ANTECUBITAL  Final   Special Requests   Final    BOTTLES DRAWN AEROBIC AND ANAEROBIC Blood Culture results may not be optimal due to an excessive volume of blood received in culture bottles   Culture NO GROWTH 4 DAYS  Final   Report Status PENDING  Incomplete  MRSA PCR Screening     Status: None   Collection Time: 07/01/17  8:56 PM  Result Value Ref Range Status   MRSA by PCR NEGATIVE NEGATIVE Final    Comment:        The GeneXpert MRSA Assay (FDA approved for NASAL specimens only), is one component of a comprehensive MRSA colonization surveillance program. It is not intended to diagnose MRSA infection nor to guide or monitor treatment for MRSA infections.     RADIOLOGY:  No results found.  EKG:   Orders placed or performed during the hospital encounter of 07/01/17  . ED EKG  . ED EKG  . EKG 12-Lead  . EKG 12-Lead      Management plans discussed with the patient, family and they are in agreement.  CODE STATUS:  Code Status History    Date Active Date Inactive Code Status Order ID Comments User Context   07/01/2017  8:23 PM 07/04/2017  4:48 PM Full Code [id number]  Altamese Dilling, MD ED   07/19/2016 11:48 AM 07/20/2016  8:10 PM Full Code [id number]  Adrian Saran, MD Inpatient   05/09/2016  2:40 PM 05/13/2016  2:37 PM Full Code [id number]  Merwyn Katos, MD ED   02/05/2016  7:14 PM 02/08/2016  2:52 PM Full Code [id number]   Ramonita Lab, MD Inpatient   12/05/2015 11:02 PM 12/09/2015  3:35 PM Full Code [id number]  Oralia Manis, MD Inpatient      TOTAL TIME TAKING CARE OF THIS PATIENT: 35 minutes.    Altamese Dilling M.D on 07/05/2017 at 2:50 PM  Between 7am to 6pm - Pager -  [phone]  After 6pm go to [email or web address] - password [email or web address]  [facility]  Office  [phone]  CC: Primary care physician; Corky DownsMasoud, Javed, MD   Note: This dictation was prepared with Dragon dictation along with smaller phrase technology. Any transcriptional errors that result from this process are unintentional.

## 2017-07-06 LAB — CULTURE, BLOOD (ROUTINE X 2)
CULTURE: NO GROWTH
Culture: NO GROWTH
Special Requests: ADEQUATE

## 2017-07-08 DIAGNOSIS — I119 Hypertensive heart disease without heart failure: Secondary | ICD-10-CM | POA: Diagnosis not present

## 2017-07-08 DIAGNOSIS — J42 Unspecified chronic bronchitis: Secondary | ICD-10-CM | POA: Diagnosis not present

## 2017-07-08 DIAGNOSIS — J439 Emphysema, unspecified: Secondary | ICD-10-CM | POA: Diagnosis not present

## 2017-07-08 DIAGNOSIS — J9610 Chronic respiratory failure, unspecified whether with hypoxia or hypercapnia: Secondary | ICD-10-CM | POA: Diagnosis not present

## 2017-07-10 LAB — BLOOD GAS, VENOUS
ACID-BASE EXCESS: 2.9 mmol/L — AB (ref 0.0–2.0)
BICARBONATE: 31.5 mmol/L — AB (ref 20.0–28.0)
O2 Saturation: 51.8 %
Patient temperature: 37
pCO2, Ven: 64 mmHg — ABNORMAL HIGH (ref 44.0–60.0)
pH, Ven: 7.3 (ref 7.250–7.430)

## 2017-07-18 DIAGNOSIS — J439 Emphysema, unspecified: Secondary | ICD-10-CM | POA: Diagnosis not present

## 2017-07-18 DIAGNOSIS — J42 Unspecified chronic bronchitis: Secondary | ICD-10-CM | POA: Diagnosis not present

## 2017-07-18 DIAGNOSIS — J9610 Chronic respiratory failure, unspecified whether with hypoxia or hypercapnia: Secondary | ICD-10-CM | POA: Diagnosis not present

## 2017-07-18 DIAGNOSIS — Z880 Allergy status to penicillin: Secondary | ICD-10-CM | POA: Diagnosis not present

## 2017-07-20 DIAGNOSIS — J441 Chronic obstructive pulmonary disease with (acute) exacerbation: Secondary | ICD-10-CM | POA: Diagnosis not present

## 2017-08-14 DIAGNOSIS — J439 Emphysema, unspecified: Secondary | ICD-10-CM | POA: Diagnosis not present

## 2017-08-14 DIAGNOSIS — J9610 Chronic respiratory failure, unspecified whether with hypoxia or hypercapnia: Secondary | ICD-10-CM | POA: Diagnosis not present

## 2017-08-14 DIAGNOSIS — J449 Chronic obstructive pulmonary disease, unspecified: Secondary | ICD-10-CM | POA: Diagnosis not present

## 2017-08-14 DIAGNOSIS — K222 Esophageal obstruction: Secondary | ICD-10-CM | POA: Diagnosis not present

## 2017-08-19 DIAGNOSIS — J441 Chronic obstructive pulmonary disease with (acute) exacerbation: Secondary | ICD-10-CM | POA: Diagnosis not present

## 2017-09-19 DIAGNOSIS — J441 Chronic obstructive pulmonary disease with (acute) exacerbation: Secondary | ICD-10-CM | POA: Diagnosis not present

## 2017-10-02 DIAGNOSIS — J9610 Chronic respiratory failure, unspecified whether with hypoxia or hypercapnia: Secondary | ICD-10-CM | POA: Diagnosis not present

## 2017-10-02 DIAGNOSIS — I43 Cardiomyopathy in diseases classified elsewhere: Secondary | ICD-10-CM | POA: Diagnosis not present

## 2017-10-02 DIAGNOSIS — I119 Hypertensive heart disease without heart failure: Secondary | ICD-10-CM | POA: Diagnosis not present

## 2017-10-02 DIAGNOSIS — Z880 Allergy status to penicillin: Secondary | ICD-10-CM | POA: Diagnosis not present

## 2017-10-09 ENCOUNTER — Ambulatory Visit
Admission: RE | Admit: 2017-10-09 | Discharge: 2017-10-09 | Disposition: A | Discharge status: Home / Self Care | Payer: Medicare Other | Source: Ambulatory Visit | Attending: Internal Medicine | Admitting: Internal Medicine

## 2017-10-09 ENCOUNTER — Ambulatory Visit
Admission: RE | Admit: 2017-10-09 | Discharge: 2017-10-09 | Disposition: A | Discharge status: Home / Self Care | Payer: Medicare Other | Source: Ambulatory Visit | Attending: Cardiology | Admitting: Cardiology

## 2017-10-09 ENCOUNTER — Other Ambulatory Visit: Payer: Self-pay | Admitting: Cardiology

## 2017-10-09 DIAGNOSIS — R062 Wheezing: Secondary | ICD-10-CM

## 2017-10-09 DIAGNOSIS — G43919 Migraine, unspecified, intractable, without status migrainosus: Secondary | ICD-10-CM | POA: Diagnosis not present

## 2017-10-09 DIAGNOSIS — I700 Atherosclerosis of aorta: Secondary | ICD-10-CM | POA: Diagnosis not present

## 2017-10-09 DIAGNOSIS — R05 Cough: Secondary | ICD-10-CM

## 2017-10-09 DIAGNOSIS — R918 Other nonspecific abnormal finding of lung field: Secondary | ICD-10-CM | POA: Insufficient documentation

## 2017-10-09 DIAGNOSIS — R059 Cough, unspecified: Secondary | ICD-10-CM

## 2017-10-09 DIAGNOSIS — I119 Hypertensive heart disease without heart failure: Secondary | ICD-10-CM | POA: Diagnosis not present

## 2017-10-09 DIAGNOSIS — J449 Chronic obstructive pulmonary disease, unspecified: Secondary | ICD-10-CM | POA: Insufficient documentation

## 2017-10-09 DIAGNOSIS — R079 Chest pain, unspecified: Secondary | ICD-10-CM | POA: Diagnosis not present

## 2017-10-09 DIAGNOSIS — R5381 Other malaise: Secondary | ICD-10-CM | POA: Diagnosis not present

## 2017-10-09 DIAGNOSIS — I10 Essential (primary) hypertension: Secondary | ICD-10-CM | POA: Diagnosis not present

## 2017-10-09 DIAGNOSIS — K2900 Acute gastritis without bleeding: Secondary | ICD-10-CM | POA: Diagnosis not present

## 2017-10-10 DIAGNOSIS — Z880 Allergy status to penicillin: Secondary | ICD-10-CM | POA: Diagnosis not present

## 2017-10-10 DIAGNOSIS — I43 Cardiomyopathy in diseases classified elsewhere: Secondary | ICD-10-CM | POA: Diagnosis not present

## 2017-10-10 DIAGNOSIS — I119 Hypertensive heart disease without heart failure: Secondary | ICD-10-CM | POA: Diagnosis not present

## 2017-10-10 DIAGNOSIS — G43919 Migraine, unspecified, intractable, without status migrainosus: Secondary | ICD-10-CM | POA: Diagnosis not present

## 2017-10-15 ENCOUNTER — Ambulatory Visit: Payer: Medicare Other | Admitting: Gastroenterology

## 2017-10-20 DIAGNOSIS — J441 Chronic obstructive pulmonary disease with (acute) exacerbation: Secondary | ICD-10-CM | POA: Diagnosis not present

## 2017-10-22 ENCOUNTER — Other Ambulatory Visit: Payer: Self-pay

## 2017-10-22 ENCOUNTER — Encounter: Payer: Self-pay | Admitting: Gastroenterology

## 2017-10-22 ENCOUNTER — Ambulatory Visit (INDEPENDENT_AMBULATORY_CARE_PROVIDER_SITE_OTHER): Payer: Medicare Other | Admitting: Gastroenterology

## 2017-10-22 VITALS — BP 152/86 | HR 93 | Ht 59.0 in | Wt 119.2 lb

## 2017-10-22 DIAGNOSIS — K222 Esophageal obstruction: Secondary | ICD-10-CM

## 2017-10-22 DIAGNOSIS — R1319 Other dysphagia: Secondary | ICD-10-CM

## 2017-10-22 DIAGNOSIS — K219 Gastro-esophageal reflux disease without esophagitis: Secondary | ICD-10-CM

## 2017-10-22 DIAGNOSIS — R1310 Dysphagia, unspecified: Secondary | ICD-10-CM | POA: Diagnosis not present

## 2017-10-22 MED ORDER — PANTOPRAZOLE SODIUM 40 MG PO TBEC: 40.0000 mg | DELAYED_RELEASE_TABLET | Freq: Two times a day (BID) | ORAL | 1 refills | Status: DC

## 2017-10-22 NOTE — Progress Notes (Signed)
Caitlyn Evans [address]  [address]  [place], [place] [address]  Main: [phone]  Fax: [phone]   Gastroenterology Consultation  Referring Provider:     Corky DownsMasoud, Javed, MD Primary Care Physician:  Corky DownsMasoud, Javed, MD Primary Gastroenterologist:  Dr. Melodie BouillonVarnita Ledon Evans Reason for Consultation:     Dysphagia        HPI:   Caitlyn Myrtha MantisM Evans is a 52 y.o. y/o female referred for consultation & management  by Dr. Corky DownsMasoud, Javed, MD.  Patient reports 5946-month history of dysphagia to solids.  Is unable to eat meats or breads as it gets stuck in her mid chest and she has to bring it back up.  We will try to eat a hamburger 2 days ago and had to bring it back up.  No ER visits for food impaction.  No weight loss or abdominal pain.  No nausea or vomiting.  No blood in stool or altered bowel habits.  [facility] every day along with Advil PM every day.  Is also a current smoker.  Has COPD and BOOP, and has been advised to quit smoking previously.  Is also on steroids chronically.  States as far as her breathing goes, now is the time that she feels at her best since last 3 months.  Patient previously had an EGD and colonoscopy in September 2016 by Dr. Servando SnareWohl.  Colonoscopy was for colorectal cancer screening and was normal and repeat was recommended in 10 years.  EGD was done for dysphagia and showed a stenosis at the GE junction which was dilated with a balloon dilator to 18mm. Esophageal and gastric ulcers were seen.  Biopsies showed gastropathy and reflux esophagitis.  Pathology report stated that this pattern can be due to NSAIDs.  Patient states after her EGD and dilation her symptoms improved and she did not have dysphagia until 2 months ago.  Past Medical History:  Diagnosis Date  . Anxiety    panic attacks  . Asthma   . BOOP (bronchiolitis obliterans with organizing pneumonia) (HCC)   . CHF (congestive heart failure) (HCC)   . Dysphagia, pharyngoesophageal phase  05/30/2015  . Emphysema lung (HCC) 05/30/2015  . Hypercholesterolemia   . Hypertension   . Lung mass   . Migraines   . Motion sickness    all moving vehicles  . Myocardial infarction (HCC) 2012  . Seasonal allergies   . Shortness of breath dyspnea    1 flight-stairs    Past Surgical History:  Procedure Laterality Date  . COLONOSCOPY WITH PROPOFOL N/A 06/02/2015   Procedure: COLONOSCOPY WITH PROPOFOL;  Surgeon: Midge Miniumarren Wohl, MD;  [facility];  Service: Endoscopy;  Laterality: N/A;  . ESOPHAGOGASTRODUODENOSCOPY (EGD) WITH PROPOFOL N/A 06/02/2015   Procedure: ESOPHAGOGASTRODUODENOSCOPY (EGD) WITH PROPOFOL withdialation;  Surgeon: Midge Miniumarren Wohl, MD;  [facility];  Service: Endoscopy;  Laterality: N/A;  . LUNG SURGERY Left    Upper lobe removed  . OOPHORECTOMY Left   . VAGINAL HYSTERECTOMY      Prior to Admission medications   Medication Sig Start Date End Date Taking? Authorizing Provider  ALPRAZolam (XANAX) 0.25 MG tablet Take 0.25 mg by mouth 2 (two) times daily.     [provider]  budesonide (PULMICORT) 0.25 MG/2ML nebulizer solution Take 2 mLs (0.25 mg total) by nebulization every 6 (six) hours. 07/04/17   Altamese DillingVachhani, Vaibhavkumar, MD  donepezil (ARICEPT) 10 MG tablet Take 10 mg by mouth daily.     [provider]  ipratropium-albuterol (DUONEB) 0.5-2.5 (3) MG/3ML SOLN Inhale 3 mLs into the lungs every 4 (four) hours as needed (for wheezing/shortness of breath). Patient taking differently: Inhale 3 mLs into the lungs every 4 (four) hours as needed. For wheezing/shortness of breath 05/13/16   Enid Baas, MD  Multiple Vitamins-Minerals (MULTIVITAMIN GUMMIES ADULT) CHEW Chew 2 each by mouth daily.     [provider]  pantoprazole (PROTONIX) 40 MG tablet Take 1 tablet (40 mg total) by mouth daily. 05/13/16   Enid Baas, MD  predniSONE (STERAPRED UNI-PAK 21 TAB) 10 MG (21) TBPK tablet Take 6 tabs first day, 5 tab on  day 2, then 4 on day 3rd, 3 tabs on day 4th , 2 tab on day 5th, and 1 tab on 6th day. 07/04/17   Altamese Dilling, MD    Family History  Problem Relation Age of Onset  . COPD Mother   . Heart attack Mother   . Stroke Father   . COPD Father      Social History   Tobacco Use  . Smoking status: Current Every Day Smoker    Packs/day: 1.00    Years: 30.00    Pack years: 30.00    Types: Cigarettes  . Smokeless tobacco: Never Used  . Tobacco comment: quit some time in 2012  Substance Use Topics  . Alcohol use: No    Alcohol/week: 0.0 oz    Comment: rare consumption  . Drug use: No    Allergies as of 10/22/2017 - Review Complete 07/01/2017  Allergen Reaction Noted  . Penicillins Hives and Other (See Comments) 05/30/2015  . Codeine Hives and Nausea And Vomiting 05/30/2015    Review of Systems:    All systems reviewed and negative except where noted in HPI.   Physical Exam:  No LMP recorded. Patient has had a hysterectomy.  Vitals reviewed Psych:  Alert and cooperative. Normal mood and affect. General:   Alert,  Well-developed, well-nourished, pleasant and cooperative in NAD Head:  Normocephalic and atraumatic. Eyes:  Sclera clear, no icterus.   Conjunctiva pink. Ears:  Normal auditory acuity. Nose:  No deformity, discharge, or lesions. Mouth:  No deformity or lesions,oropharynx pink & moist. Neck:  Supple; no masses or thyromegaly. Lungs:  Respirations even and unlabored.  Clear throughout to auscultation.   No wheezes, crackles, or rhonchi. No acute distress. Heart:  Regular rate and rhythm; no murmurs, clicks, rubs, or gallops. Abdomen:  Normal bowel sounds.  No bruits.  Soft, non-tender and non-distended without masses, hepatosplenomegaly or hernias noted.  No guarding or rebound tenderness.    Msk:  Symmetrical without gross deformities. Good, equal movement & strength bilaterally. Pulses:  Normal pulses noted. Extremities:  No clubbing or edema.  No  cyanosis. Neurologic:  Alert and oriented x3;  grossly normal neurologically. Skin:  Intact without significant lesions or rashes. No jaundice. Lymph Nodes:  No significant cervical adenopathy. Psych:  Alert and cooperative. Normal mood and affect.   Labs: CBC    Component Value Date/Time   WBC 16.3 (H) 07/02/2017 0311   RBC 4.12 07/02/2017 0311   HGB 12.1 07/02/2017 0311   HGB 14.2 06/22/2013 1253   HCT 37.6 07/02/2017 0311   HCT 42.7 06/22/2013 1253   PLT 236 07/02/2017 0311   PLT 291 06/22/2013 1253   MCV 91.3 07/02/2017 0311   MCV 89 06/22/2013 1253   MCH 29.4 07/02/2017 0311   MCHC 32.2 07/02/2017 0311   RDW 16.5 (H) 07/02/2017 0311   RDW 15.1 (  H) 06/22/2013 1253   LYMPHSABS 0.9 (L) 07/01/2017 1719   LYMPHSABS 0.9 (L) 07/23/2012 0555   MONOABS 1.0 (H) 07/01/2017 1719   MONOABS 0.7 07/23/2012 0555   EOSABS 0.0 07/01/2017 1719   EOSABS 0.0 07/23/2012 0555   BASOSABS 0.0 07/01/2017 1719   BASOSABS 0.0 07/23/2012 0555   CMP     Component Value Date/Time   NA 141 07/02/2017 0311   NA 140 06/22/2013 1253   K 4.7 07/02/2017 0311   K 3.5 06/22/2013 1253   CL 105 07/02/2017 0311   CL 110 (H) 06/22/2013 1253   CO2 29 07/02/2017 0311   CO2 24 06/22/2013 1253   GLUCOSE 144 (H) 07/02/2017 0311   GLUCOSE 149 (H) 06/22/2013 1253   BUN 23 (H) 07/02/2017 0311   BUN 16 06/22/2013 1253   CREATININE 0.77 07/02/2017 0311   CREATININE 0.85 06/22/2013 1253   CALCIUM 8.5 (L) 07/02/2017 0311   CALCIUM 8.8 06/22/2013 1253   PROT 7.1 07/16/2016 1514   PROT 7.5 06/22/2013 1253   ALBUMIN 4.0 07/16/2016 1514   ALBUMIN 4.1 06/22/2013 1253   AST 20 07/16/2016 1514   AST 33 06/22/2013 1253   ALT 11 (L) 07/16/2016 1514   ALT 54 06/22/2013 1253   ALKPHOS 61 07/16/2016 1514   ALKPHOS 107 06/22/2013 1253   BILITOT 0.6 07/16/2016 1514   BILITOT 0.2 06/22/2013 1253   GFRNONAA >60 07/02/2017 0311   GFRNONAA >60 06/22/2013 1253   GFRAA >60 07/02/2017 0311   GFRAA >60 06/22/2013 1253     Imaging Studies: Dg Chest 2 View  Result Date: 10/09/2017 CLINICAL DATA:  Mid chest pain and nonproductive cough for the past 2-3 months. History of loop, CHF, emphysema, previous MI, current smoker. Previous left upper lobectomy. EXAM: CHEST  2 VIEW COMPARISON:  Portable chest x-ray of July 01, 2017 and chest CT scan of April 15, 2017. FINDINGS: The lungs are adequately inflated. The interstitial markings are coarse. There is stable biapical pleural scarring. The heart and pulmonary vascularity are normal. The mediastinum is normal in width. There calcification in the wall of the aortic arch. The bony thorax exhibits no acute abnormality. IMPRESSION: COPD. Stable biapical pleural scarring. No evidence of pneumonia nor CHF. Thoracic aortic atherosclerosis. Electronically Signed   By: David  [place] M.D.   On: 10/09/2017 16:34    Assessment and Plan:   Caitlyn Evans is a 52 y.o. y/o female has been referred for 66-month history of dysphagia, with EGD with dilation in September 2016 leading to improvement in symptoms symptoms at that time  Recurrence of symptoms is likely due to peptic stricture Patient is on pantoprazole daily which has improved her symptoms somewhat but there is still present. Due to the previously seen esophageal ulcers on her EGD in 2016, and since her symptoms are still present we will increase PPI to twice daily.  This would allow for any underlying esophagitis or esophageal ulcers to heal before the EGD, to allow for a safer dilation (if needed).   Extensively discussed risk of the medication and extensively discussed lifestyle modifications. Risks of PPI use were discussed with patient including bone loss, C. Diff diarrhea, pneumonia, infections, CKD, electrolyte abnormalities. Pt. Verbalizes understanding and chooses to continue the medication.   Patient continues to smoke despite her COPD and BOOP, and use NSAIDs daily. Risks of ulcerations with ongoing NSAID use and  steroids was discussed extensively.  Patient was asked to abstain from NSAIDs. Acid reflux lifestyle modification discussed extensively  as well.  Patient was asked to not eat 3 hours before bedtime.  Patient was asked to buy a bed wedge and prescription given for the same.  She states she will get it from the medical supply store she goes to. Patient was asked to follow dysphagia diet until the EGD.  Handout given for the same.  She was asked to chop her food well, chew well, small bites followed by liquids, and stay upright for 3 hours after eating.  No signs of food impaction present at this time to indicate emergent EGD  She was also asked to quit smoking, and the risks of nonhealing gastric and esophageal ulcers with ongoing smoking, in addition to worsening her lung disease, was extensively discussed as well.  If patient does not modify lifestyle habits recommended above, including quitting smoking, quitting NSAID use, acid reflux lifestyle modification, this will affect her response to treatment despite any PPIs or any interventions including dilation.  This was discussed with her extensively as well.  Colonoscopy up-to-date, next one due in 2026.  No new or alarm symptoms present at this time.  Dr Caitlyn Bouillon

## 2017-10-22 NOTE — Patient Instructions (Addendum)
F/U 3 months Bed wedge   Dysphagia Dysphagia is trouble swallowing. This condition occurs when solids and liquids stick in a person's throat on the way down to the stomach, or when food takes longer to get to the stomach. You may have problems swallowing food, liquids, or both. You may also have pain while trying to swallow. It may take you more time and effort to swallow something. What are the causes? This condition is caused by:  Problems with the muscles. They may make it difficult for you to move food and liquids through the tube that connects your mouth to your stomach (esophagus). You may have ulcers, scar tissue, or inflammation that blocks the normal passage of food and liquids. Causes of these problems include: ? Acid reflux from your stomach into your esophagus (gastroesophageal reflux). ? Infections. ? Radiation treatment for cancer. ? Medicines taken without enough fluids to wash them down into your stomach.  Nerve problems. These prevent signals from being sent to the muscles of your esophagus to squeeze (contract) and move what you swallow down to your stomach.  Globus pharyngeus. This is a common problem that involves feeling like something is stuck in the throat or a sense of trouble with swallowing even though nothing is wrong with the swallowing passages.  Stroke. This can affect the nerves and make it difficult to swallow.  Certain conditions, such as cerebral palsy or Parkinson disease.  What are the signs or symptoms? Common symptoms of this condition include:  A feeling that solids or liquids are stuck in your throat on the way down to the stomach.  Food taking too long to get to the stomach.  Other symptoms include:  Food moving back from your stomach to your mouth (regurgitation).  Noises coming from your throat.  Chest discomfort with swallowing.  A feeling of fullness when swallowing.  Drooling, especially when the throat is blocked.  Pain while  swallowing.  Heartburn.  Coughing or gagging while trying to swallow.  How is this diagnosed? This condition is diagnosed by:  Barium X-ray. In this test, you swallow a white substance (contrast medium)that sticks to the inside of your esophagus. X-ray images are then taken.  Endoscopy. In this test, a flexible telescope is inserted down your throat to look at your esophagus and your stomach.  CT scans and MRI.  How is this treated? Treatment for dysphagia depends on the cause of the condition:  If the dysphagia is caused by acid reflux or infection, medicines may be used. They may include antibiotics and heartburn medicines.  If the dysphagia is caused by problems with your muscles, swallowing therapy may be used to help you strengthen your swallowing muscles. You may have to do specific exercises to strengthen the muscles or stretch them.  If the dysphagia is caused by a blockage or mass, procedures to remove the blockage may be done. You may need surgery and a feeding tube.  You may need to make diet changes. Ask your health care provider for specific instructions. Follow these instructions at home: Eating and drinking  Try to eat soft food that is easier to swallow.  Follow any diet changes as told by your health care provider.  Cut your food into small pieces and eat slowly.  Eat and drink only when you are sitting upright.  Do not drink alcohol or caffeine. If you need help quitting, ask your health care provider. General instructions  Check your weight every day to make sure you  are not losing weight.  Take over-the-counter and prescription medicines only as told by your health care provider.  If you were prescribed an antibiotic medicine, take it as told by your health care provider. Do not stop taking the antibiotic even if you start to feel better.  Do not use any products that contain nicotine or tobacco, such as cigarettes and e-cigarettes. If you need help  quitting, ask your health care provider.  Keep all follow-up visits as told by your health care provider. This is important. Contact a health care provider if:  You lose weight because you cannot swallow.  You cough when you drink liquids (aspiration).  You cough up partially digested food. Get help right away if:  You cannot swallow your saliva.  You have shortness of breath or a fever, or both.  You have a hoarse voice and also have trouble swallowing. Summary  Dysphagia is trouble swallowing. This condition occurs when solids and liquids stick in a person's throat on the way down to the stomach, or when food takes longer to get to the stomach.  Dysphagia has many possible causes and symptoms.  Treatment for dysphagia depends on the cause of the condition. This information is not intended to replace advice given to you by your health care provider. Make sure you discuss any questions you have with your health care provider. Document Released: 08/30/2000 Document Revised: 08/22/2016 Document Reviewed: 08/22/2016 Elsevier Interactive Patient Education  2017 Elsevier Inc.  Gastroesophageal Reflux Disease, Adult Normally, food travels down the esophagus and stays in the stomach to be digested. If a person has gastroesophageal reflux disease (GERD), food and stomach acid move back up into the esophagus. When this happens, the esophagus becomes sore and swollen (inflamed). Over time, GERD can make small holes (ulcers) in the lining of the esophagus. Follow these instructions at home: Diet  Follow a diet as told by your doctor. You may need to avoid foods and drinks such as: ? Coffee and tea (with or without caffeine). ? Drinks that contain alcohol. ? Energy drinks and sports drinks. ? Carbonated drinks or sodas. ? Chocolate and cocoa. ? Peppermint and mint flavorings. ? Garlic and onions. ? Horseradish. ? Spicy and acidic foods, such as peppers, chili powder, curry powder,  vinegar, hot sauces, and BBQ sauce. ? Citrus fruit juices and citrus fruits, such as oranges, lemons, and limes. ? Tomato-based foods, such as red sauce, chili, salsa, and pizza with red sauce. ? Fried and fatty foods, such as donuts, french fries, potato chips, and high-fat dressings. ? High-fat meats, such as hot dogs, rib eye steak, sausage, ham, and bacon. ? High-fat dairy items, such as whole milk, butter, and cream cheese.  Eat small meals often. Avoid eating large meals.  Avoid drinking large amounts of liquid with your meals.  Avoid eating meals during the 2-3 hours before bedtime.  Avoid lying down right after you eat.  Do not exercise right after you eat. General instructions  Pay attention to any changes in your symptoms.  Take over-the-counter and prescription medicines only as told by your doctor. Do not take aspirin, ibuprofen, or other NSAIDs unless your doctor says it is okay.  Do not use any tobacco products, including cigarettes, chewing tobacco, and e-cigarettes. If you need help quitting, ask your doctor.  Wear loose clothes. Do not wear anything tight around your waist.  Raise (elevate) the head of your bed about 6 inches (15 cm).  Try to lower your stress. If  you need help doing this, ask your doctor.  If you are overweight, lose an amount of weight that is healthy for you. Ask your doctor about a safe weight loss goal.  Keep all follow-up visits as told by your doctor. This is important. Contact a doctor if:  You have new symptoms.  You lose weight and you do not know why it is happening.  You have trouble swallowing, or it hurts to swallow.  You have wheezing or a cough that keeps happening.  Your symptoms do not get better with treatment.  You have a hoarse voice. Get help right away if:  You have pain in your arms, neck, jaw, teeth, or back.  You feel sweaty, dizzy, or light-headed.  You have chest pain or shortness of breath.  You throw  up (vomit) and your throw up looks like blood or coffee grounds.  You pass out (faint).  Your poop (stool) is bloody or black.  You cannot swallow, drink, or eat. This information is not intended to replace advice given to you by your health care provider. Make sure you discuss any questions you have with your health care provider. Document Released: 02/19/2008 Document Revised: 02/08/2016 Document Reviewed: 12/28/2014 Elsevier Interactive Patient Education  2018 [employer].  Coping with Quitting Smoking Quitting smoking is a physical and mental challenge. You will face cravings, withdrawal symptoms, and temptation. Before quitting, work with your health care provider to make a plan that can help you cope. Preparation can help you quit and keep you from giving in. How can I cope with cravings? Cravings usually last for 5-10 minutes. If you get through it, the craving will pass. Consider taking the following actions to help you cope with cravings: Keep your mouth busy: Chew sugar-free gum. Suck on hard candies or a straw. Brush your teeth. Keep your hands and body busy: Immediately change to a different activity when you feel a craving. Squeeze or play with a ball. Do an activity or a hobby, like making bead jewelry, practicing needlepoint, or working with wood. Mix up your normal routine. Take a short exercise break. Go for a quick walk or run up and down stairs. Spend time in public places where smoking is not allowed. Focus on doing something kind or helpful for someone else. Call a friend or family member to talk during a craving. Join a support group. Call a quit line, such as 1-800-QUIT-NOW. Talk with your health care provider about medicines that might help you cope with cravings and make quitting easier for you.  How can I deal with withdrawal symptoms? Your body may experience negative effects as it tries to get used to not having nicotine in the system. These effects are  called withdrawal symptoms. They may include: Feeling hungrier than normal. Trouble concentrating. Irritability. Trouble sleeping. Feeling depressed. Restlessness and agitation. Craving a cigarette.  To manage withdrawal symptoms: Avoid places, people, and activities that trigger your cravings. Remember why you want to quit. Get plenty of sleep. Avoid coffee and other caffeinated drinks. These may worsen some of your symptoms.  How can I handle social situations? Social situations can be difficult when you are quitting smoking, especially in the first few weeks. To manage this, you can: Avoid parties, bars, and other social situations where people might be smoking. Avoid alcohol. Leave right away if you have the urge to smoke. Explain to your family and friends that you are quitting smoking. Ask for understanding and support. Plan activities with  friends or family where smoking is not an option.  What are some ways I can cope with stress? Wanting to smoke may cause stress, and stress can make you want to smoke. Find ways to manage your stress. Relaxation techniques can help. For example: Breathe slowly and deeply, in through your nose and out through your mouth. Listen to soothing, relaxing music. Talk with a family member or friend about your stress. Light a candle. Soak in a bath or take a shower. Think about a peaceful place.  What are some ways I can prevent weight gain? Be aware that many people gain weight after they quit smoking. However, not everyone does. To keep from gaining weight, have a plan in place before you quit and stick to the plan after you quit. Your plan should include: Having healthy snacks. When you have a craving, it may help to: Eat plain popcorn, crunchy carrots, celery, or other cut vegetables. Chew sugar-free gum. Changing how you eat: Eat small portion sizes at meals. Eat 4-6 small meals throughout the day instead of 1-2 large meals a day. Be  mindful when you eat. Do not watch television or do other things that might distract you as you eat. Exercising regularly: Make time to exercise each day. If you do not have time for a long workout, do short bouts of exercise for 5-10 minutes several times a day. Do some form of strengthening exercise, like weight lifting, and some form of aerobic exercise, like running or swimming. Drinking plenty of water or other low-calorie or no-calorie drinks. Drink 6-8 glasses of water daily, or as much as instructed by your health care provider.  Summary Quitting smoking is a physical and mental challenge. You will face cravings, withdrawal symptoms, and temptation to smoke again. Preparation can help you as you go through these challenges. You can cope with cravings by keeping your mouth busy (such as by chewing gum), keeping your body and hands busy, and making calls to family, friends, or a helpline for people who want to quit smoking. You can cope with withdrawal symptoms by avoiding places where people smoke, avoiding drinks with caffeine, and getting plenty of rest. Ask your health care provider about the different ways to prevent weight gain, avoid stress, and handle social situations. This information is not intended to replace advice given to you by your health care provider. Make sure you discuss any questions you have with your health care provider. Document Released: 08/30/2016 Document Revised: 08/30/2016 Document Reviewed: 08/30/2016 Elsevier Interactive Patient Education  2018 [employer].  Steps to Quit Smoking Smoking tobacco can be bad for your health. It can also affect almost every organ in your body. Smoking puts you and people around you at risk for many serious long-lasting (chronic) diseases. Quitting smoking is hard, but it is one of the best things that you can do for your health. It is never too late to quit. What are the benefits of quitting smoking? When you quit smoking, you  lower your risk for getting serious diseases and conditions. They can include:  Lung cancer or lung disease.  Heart disease.  Stroke.  Heart attack.  Not being able to have children (infertility).  Weak bones (osteoporosis) and broken bones (fractures).  If you have coughing, wheezing, and shortness of breath, those symptoms may get better when you quit. You may also get sick less often. If you are pregnant, quitting smoking can help to lower your chances of having a baby of low  birth weight. What can I do to help me quit smoking? Talk with your doctor about what can help you quit smoking. Some things you can do (strategies) include:  Quitting smoking totally, instead of slowly cutting back how much you smoke over a period of time.  Going to in-person counseling. You are more likely to quit if you go to many counseling sessions.  Using resources and support systems, such as: ? Agricultural engineernline chats with a Veterinary surgeoncounselor. ? Phone quitlines. ? Automotive engineerrinted self-help materials. ? Support groups or group counseling. ? Text messaging programs. ? Mobile phone apps or applications.  Taking medicines. Some of these medicines may have nicotine in them. If you are pregnant or breastfeeding, do not take any medicines to quit smoking unless your doctor says it is okay. Talk with your doctor about counseling or other things that can help you.  Talk with your doctor about using more than one strategy at the same time, such as taking medicines while you are also going to in-person counseling. This can help make quitting easier. What things can I do to make it easier to quit? Quitting smoking might feel very hard at first, but there is a lot that you can do to make it easier. Take these steps:  Talk to your family and friends. Ask them to support and encourage you.  Call phone quitlines, reach out to support groups, or work with a Veterinary surgeoncounselor.  Ask people who smoke to not smoke around you.  Avoid places that  make you want (trigger) to smoke, such as: ? Bars. ? Parties. ? Smoke-break areas at work.  Spend time with people who do not smoke.  Lower the stress in your life. Stress can make you want to smoke. Try these things to help your stress: ? Getting regular exercise. ? Deep-breathing exercises. ? Yoga. ? Meditating. ? Doing a body scan. To do this, close your eyes, focus on one area of your body at a time from head to toe, and notice which parts of your body are tense. Try to relax the muscles in those areas.  Download or buy apps on your mobile phone or tablet that can help you stick to your quit plan. There are many free apps, such as QuitGuide from the [employer] developer(Centers for Disease Control and Prevention). You can find more support from smokefree.gov and other websites.  This information is not intended to replace advice given to you by your health care provider. Make sure you discuss any questions you have with your health care provider. Document Released: 06/29/2009 Document Revised: 04/30/2016 Document Reviewed: 01/17/2015 Elsevier Interactive Patient Education  2018 [employer]

## 2017-10-27 ENCOUNTER — Other Ambulatory Visit: Payer: Self-pay

## 2017-11-05 ENCOUNTER — Ambulatory Visit
Admission: RE | Admit: 2017-11-05 | Discharge: 2017-11-05 | Disposition: A | Discharge status: Home / Self Care | Payer: Medicare Other | Source: Ambulatory Visit | Attending: Gastroenterology | Admitting: Gastroenterology

## 2017-11-05 ENCOUNTER — Ambulatory Visit: Payer: Medicare Other | Admitting: Anesthesiology

## 2017-11-05 ENCOUNTER — Encounter
Admission: RE | Disposition: A | Discharge status: Home / Self Care | Payer: Self-pay | Source: Ambulatory Visit | Attending: Gastroenterology

## 2017-11-05 ENCOUNTER — Other Ambulatory Visit: Payer: Self-pay | Admitting: Gastroenterology

## 2017-11-05 DIAGNOSIS — F418 Other specified anxiety disorders: Secondary | ICD-10-CM | POA: Insufficient documentation

## 2017-11-05 DIAGNOSIS — Z823 Family history of stroke: Secondary | ICD-10-CM | POA: Insufficient documentation

## 2017-11-05 DIAGNOSIS — K2289 Other specified disease of esophagus: Secondary | ICD-10-CM

## 2017-11-05 DIAGNOSIS — K210 Gastro-esophageal reflux disease with esophagitis: Secondary | ICD-10-CM | POA: Diagnosis not present

## 2017-11-05 DIAGNOSIS — K2210 Ulcer of esophagus without bleeding: Secondary | ICD-10-CM | POA: Diagnosis not present

## 2017-11-05 DIAGNOSIS — K3189 Other diseases of stomach and duodenum: Secondary | ICD-10-CM

## 2017-11-05 DIAGNOSIS — Z90710 Acquired absence of both cervix and uterus: Secondary | ICD-10-CM | POA: Insufficient documentation

## 2017-11-05 DIAGNOSIS — Z79899 Other long term (current) drug therapy: Secondary | ICD-10-CM | POA: Diagnosis not present

## 2017-11-05 DIAGNOSIS — K279 Peptic ulcer, site unspecified, unspecified as acute or chronic, without hemorrhage or perforation: Secondary | ICD-10-CM | POA: Diagnosis not present

## 2017-11-05 DIAGNOSIS — I509 Heart failure, unspecified: Secondary | ICD-10-CM | POA: Insufficient documentation

## 2017-11-05 DIAGNOSIS — J439 Emphysema, unspecified: Secondary | ICD-10-CM | POA: Insufficient documentation

## 2017-11-05 DIAGNOSIS — K222 Esophageal obstruction: Secondary | ICD-10-CM | POA: Diagnosis not present

## 2017-11-05 DIAGNOSIS — F419 Anxiety disorder, unspecified: Secondary | ICD-10-CM | POA: Insufficient documentation

## 2017-11-05 DIAGNOSIS — Z880 Allergy status to penicillin: Secondary | ICD-10-CM | POA: Diagnosis not present

## 2017-11-05 DIAGNOSIS — F17210 Nicotine dependence, cigarettes, uncomplicated: Secondary | ICD-10-CM | POA: Diagnosis not present

## 2017-11-05 DIAGNOSIS — R0600 Dyspnea, unspecified: Secondary | ICD-10-CM | POA: Insufficient documentation

## 2017-11-05 DIAGNOSIS — Z885 Allergy status to narcotic agent status: Secondary | ICD-10-CM | POA: Diagnosis not present

## 2017-11-05 DIAGNOSIS — E7800 Pure hypercholesterolemia, unspecified: Secondary | ICD-10-CM | POA: Diagnosis not present

## 2017-11-05 DIAGNOSIS — K228 Other specified diseases of esophagus: Secondary | ICD-10-CM | POA: Diagnosis not present

## 2017-11-05 DIAGNOSIS — I252 Old myocardial infarction: Secondary | ICD-10-CM | POA: Insufficient documentation

## 2017-11-05 DIAGNOSIS — K208 Other esophagitis: Secondary | ICD-10-CM | POA: Diagnosis not present

## 2017-11-05 DIAGNOSIS — Z8249 Family history of ischemic heart disease and other diseases of the circulatory system: Secondary | ICD-10-CM | POA: Diagnosis not present

## 2017-11-05 DIAGNOSIS — R1319 Other dysphagia: Secondary | ICD-10-CM

## 2017-11-05 DIAGNOSIS — G43909 Migraine, unspecified, not intractable, without status migrainosus: Secondary | ICD-10-CM | POA: Diagnosis not present

## 2017-11-05 DIAGNOSIS — I110 Hypertensive heart disease with heart failure: Secondary | ICD-10-CM | POA: Insufficient documentation

## 2017-11-05 DIAGNOSIS — J45909 Unspecified asthma, uncomplicated: Secondary | ICD-10-CM | POA: Insufficient documentation

## 2017-11-05 DIAGNOSIS — Z825 Family history of asthma and other chronic lower respiratory diseases: Secondary | ICD-10-CM | POA: Diagnosis not present

## 2017-11-05 DIAGNOSIS — K449 Diaphragmatic hernia without obstruction or gangrene: Secondary | ICD-10-CM | POA: Diagnosis not present

## 2017-11-05 DIAGNOSIS — K209 Esophagitis, unspecified without bleeding: Secondary | ICD-10-CM

## 2017-11-05 DIAGNOSIS — K219 Gastro-esophageal reflux disease without esophagitis: Secondary | ICD-10-CM

## 2017-11-05 DIAGNOSIS — R1314 Dysphagia, pharyngoesophageal phase: Secondary | ICD-10-CM | POA: Diagnosis not present

## 2017-11-05 DIAGNOSIS — R1310 Dysphagia, unspecified: Secondary | ICD-10-CM

## 2017-11-05 HISTORY — PX: ESOPHAGOGASTRODUODENOSCOPY (EGD) WITH PROPOFOL: SHX5813

## 2017-11-05 LAB — KOH PREP: Special Requests: NORMAL

## 2017-11-05 SURGERY — ESOPHAGOGASTRODUODENOSCOPY (EGD) WITH PROPOFOL
Anesthesia: General

## 2017-11-05 MED ORDER — FLUCONAZOLE 200 MG PO TABS: ORAL_TABLET | ORAL | 0 refills | Status: DC

## 2017-11-05 MED ORDER — SODIUM CHLORIDE 0.9 % IV SOLN
INTRAVENOUS | Status: DC
  Administered 2017-11-05: 09:00:00 via INTRAVENOUS
  Administered 2017-11-05: 1000 mL via INTRAVENOUS

## 2017-11-05 MED ORDER — PROPOFOL 500 MG/50ML IV EMUL
INTRAVENOUS | Status: DC | PRN
  Administered 2017-11-05: 50 ug/kg/min via INTRAVENOUS

## 2017-11-05 NOTE — Op Note (Addendum)
[facility] Patient Name: Caitlyn Evans Procedure Date: 11/05/2017 8:49 AM MRN: [id number] Account #: [id number] Date of Birth: 11-Dec-1965 Admit Type: Outpatient Age: 52 Room: [facility] ENDO ROOM 3 Gender: Female Note Status: Finalized Procedure:            Upper GI endoscopy Indications:          Dysphagia Providers:            Jquan Egelston B. Maximino Greenland MD, MD Referring MD:         Corky Downs, MD (Referring MD) Medicines:            Monitored Anesthesia Care Complications:        No immediate complications. Procedure:            Pre-Anesthesia Assessment:                       - The risks and benefits of the procedure and the                        sedation options and risks were discussed with the                        patient. All questions were answered and informed                        consent was obtained.                       - Patient identification and proposed procedure were                        verified prior to the procedure.                       - ASA Grade Assessment: III - A patient with severe                        systemic disease.                       After obtaining informed consent, the endoscope was                        passed under direct vision. Throughout the procedure,                        the patient's blood pressure, pulse, and oxygen                        saturations were monitored continuously. The Endoscope                        was introduced through the mouth, and advanced to the                        second part of duodenum. The upper GI endoscopy was                        accomplished with ease. The patient tolerated the  procedure well. Findings:      One superficial esophageal ulcer with no bleeding and no stigmata of       recent bleeding was found 35 cm from the incisors. The lesion was 4 mm       in largest dimension. Biopsies were taken with a cold forceps for   histology.      Esophagitis with no bleeding was found in the distal esophagus.      White nummular lesions were noted in the mid esophagus. Biopsies were       taken with a cold forceps for histology. Cells for cytology were       obtained by brushing.      There were no strictures in the esophagus. The site of the ulcer was       causing a slight narrowing in the area, but there was no resistance to       passage of the adult EGD scope at any time. Dilation was not done due to       the ulceration and esophagitis in the area, as that would entail a       higher risk of perforation in the setting of ulceration and esophagitis.      A small hiatal hernia was present. Diagphragmatic pinch at 37 cm, and       Top of gastric folds and GE junction at 35 cm      Patchy mildly erythematous mucosa without bleeding was found in the       gastric antrum. Biopsies were taken with a cold forceps for histology.      The examined duodenum was normal. Impression:           - Non-bleeding esophageal ulcer. Biopsied.                       - Esophagitis.                       - White nummular lesions in esophageal mucosa.                        Biopsied. Cells for cytology obtained.                       - There were no strictures in the esophagus. The site                        of the ulcer was causing a slight narrowing in the                        area, but there was no resistance to passage of the                        adult EGD scope at any time. Dilation was not done due                        to the ulceration and esophagitis in the area, as that                        would entail a higher risk of perforation in the  setting of ulceration and esophagitis.                       - Small hiatal hernia.                       - Erythematous mucosa in the antrum. Biopsied.                       - Normal examined duodenum. Recommendation:       - Await pathology results.                        - Follow an antireflux regimen.                       - Continue present medications.                       - Return to GI clinic in 4 weeks.                       - To allow for esophagitis and esophageal ulceration to                        heal patient is recommended to quit smoking                       - Return to primary care physician in 2 weeks.                       - The findings and recommendations were discussed with                        the patient. Procedure Code(s):    --- Professional ---                       [id number], Esophagogastroduodenoscopy, flexible, transoral;                        with biopsy, single or multiple Diagnosis Code(s):    --- Professional ---                       K22.10, Ulcer of esophagus without bleeding                       K20.9, Esophagitis, unspecified                       K22.8, Other specified diseases of esophagus                       K44.9, Diaphragmatic hernia without obstruction or                        gangrene                       K31.89, Other diseases of stomach and duodenum                       R13.10, Dysphagia, unspecified CPT copyright 2016 American Medical Association. All rights reserved. The codes documented in this report  are preliminary and upon coder review may  be revised to meet current compliance requirements.  Melodie BouillonVarnita Ostin Mathey, MD Michel BickersVarnita B. Maximino Greenlandahiliani MD, MD 11/05/2017 9:24:45 AM This report has been signed electronically. Number of Addenda: 0 Note Initiated On: 11/05/2017 8:49 AM      [facility]

## 2017-11-05 NOTE — Anesthesia Post-op Follow-up Note (Signed)
Anesthesia QCDR form completed.        

## 2017-11-05 NOTE — Anesthesia Preprocedure Evaluation (Signed)
Anesthesia Evaluation  Patient identified by MRN, date of birth, ID band Patient awake    Reviewed: Allergy & Precautions, H&P , NPO status , reviewed documented beta blocker date and time   History of Anesthesia Complications (+) POST - OP SPINAL HEADACHE  Airway Mallampati: III  TM Distance: >3 FB     Dental  (+) Missing   Pulmonary shortness of breath, with exertion and Long-Term Oxygen Therapy, asthma , pneumonia, resolved, COPD, Current Smoker,    Pulmonary exam normal        Cardiovascular hypertension, + Past MI and +CHF  Normal cardiovascular exam     Neuro/Psych  Headaches, PSYCHIATRIC DISORDERS Anxiety Depression    GI/Hepatic PUD,   Endo/Other    Renal/GU      Musculoskeletal   Abdominal   Peds  Hematology   Anesthesia Other Findings O2 at night, pulmonary status at baseline   Reproductive/Obstetrics                            Anesthesia Physical Anesthesia Plan  ASA: III  Anesthesia Plan: General   Post-op Pain Management:    Induction:   PONV Risk Score and Plan: 2 and Propofol infusion  Airway Management Planned:   Additional Equipment:   Intra-op Plan:   Post-operative Plan:   Informed Consent: I have reviewed the patients History and Physical, chart, labs and discussed the procedure including the risks, benefits and alternatives for the proposed anesthesia with the patient or authorized representative who has indicated his/her understanding and acceptance.   Dental Advisory Given  Plan Discussed with: CRNA  Anesthesia Plan Comments:         Anesthesia Quick Evaluation

## 2017-11-05 NOTE — H&P (Signed)
Caitlyn BouillonVarnita Tahiliani, MD [address], [address], [place], [place], [id number] [address], [address], [place], [place], [address] Phone: [phone]  Fax: [phone]  Primary Care Physician:  Corky DownsMasoud, Javed, MD   Pre-Procedure History & Physical: HPI:  Caitlyn Myrtha MantisM Anselmo is a 52 y.o. female is here for EGD.   Past Medical History:  Diagnosis Date  . Anxiety    panic attacks  . Asthma   . BOOP (bronchiolitis obliterans with organizing pneumonia) (HCC)   . CHF (congestive heart failure) (HCC)   . Dysphagia, pharyngoesophageal phase 05/30/2015  . Emphysema lung (HCC) 05/30/2015  . Hypercholesterolemia   . Hypertension   . Lung mass   . Migraines   . Motion sickness    all moving vehicles  . Myocardial infarction (HCC) 2012  . Seasonal allergies   . Shortness of breath dyspnea    1 flight-stairs    Past Surgical History:  Procedure Laterality Date  . COLONOSCOPY WITH PROPOFOL N/A 06/02/2015   Procedure: COLONOSCOPY WITH PROPOFOL;  Surgeon: Midge Miniumarren Wohl, MD;  [facility];  Service: Endoscopy;  Laterality: N/A;  . ESOPHAGOGASTRODUODENOSCOPY (EGD) WITH PROPOFOL N/A 06/02/2015   Procedure: ESOPHAGOGASTRODUODENOSCOPY (EGD) WITH PROPOFOL withdialation;  Surgeon: Midge Miniumarren Wohl, MD;  [facility];  Service: Endoscopy;  Laterality: N/A;  . LUNG SURGERY Left    Upper lobe removed  . OOPHORECTOMY Left   . VAGINAL HYSTERECTOMY      Prior to Admission medications   Medication Sig Start Date End Date Taking? Authorizing Provider  ALPRAZolam (XANAX) 0.25 MG tablet Take 0.25 mg by mouth 2 (two) times daily.    Yes [provider]  budesonide (PULMICORT) 0.25 MG/2ML nebulizer solution Take 2 mLs (0.25 mg total) by nebulization every 6 (six) hours. 07/04/17  Yes Altamese DillingVachhani, Vaibhavkumar, MD  cholecalciferol (VITAMIN D) 1000 units tablet Take 2,000 Units by mouth daily.   Yes [provider]  donepezil (ARICEPT) 10 MG tablet Take 10 mg by mouth  daily.    Yes [provider]  ipratropium-albuterol (DUONEB) 0.5-2.5 (3) MG/3ML SOLN Inhale 3 mLs into the lungs every 4 (four) hours as needed (for wheezing/shortness of breath). Patient taking differently: Inhale 3 mLs into the lungs every 4 (four) hours as needed. For wheezing/shortness of breath 05/13/16  Yes Enid BaasKalisetti, Radhika, MD  Multiple Vitamins-Minerals (MULTIVITAMIN GUMMIES ADULT) CHEW Chew 2 each by mouth daily.    Yes [provider]  pantoprazole (PROTONIX) 40 MG tablet Take 1 tablet (40 mg total) by mouth 2 (two) times daily. 10/22/17  Yes Pasty Spillersahiliani, Varnita B, MD  predniSONE (STERAPRED UNI-PAK 21 TAB) 10 MG (21) TBPK tablet Take 6 tabs first day, 5 tab on day 2, then 4 on day 3rd, 3 tabs on day 4th , 2 tab on day 5th, and 1 tab on 6th day. 07/04/17  Yes Altamese DillingVachhani, Vaibhavkumar, MD  rosuvastatin (CRESTOR) 10 MG tablet  10/10/17   [provider]    Allergies as of 10/22/2017 - Review Complete 10/22/2017  Allergen Reaction Noted  . Penicillins Hives and Other (See Comments) 05/30/2015  . Codeine Hives, Nausea And Vomiting, and Nausea Only 05/30/2015    Family History  Problem Relation Age of Onset  . COPD Mother   . Heart attack Mother   . Stroke Father   . COPD Father     Social History   Socioeconomic History  . Marital status: Married    Spouse name: Not on file  . Number of children: Not on  file  . Years of education: Not on file  . Highest education level: Not on file  Social Needs  . Financial resource strain: Not on file  . Food insecurity - worry: Not on file  . Food insecurity - inability: Not on file  . Transportation needs - medical: Not on file  . Transportation needs - non-medical: Not on file  Occupational History  . Not on file  Tobacco Use  . Smoking status: Current Every Day Smoker    Packs/day: 1.00    Years: 30.00    Pack years: 30.00    Types: Cigarettes  . Smokeless tobacco: Never Used  . Tobacco comment: quit  some time in 2012  Substance and Sexual Activity  . Alcohol use: No    Alcohol/week: 0.0 oz    Comment: rare consumption  . Drug use: No  . Sexual activity: Not Currently    Partners: Male  Other Topics Concern  . Not on file  Social History Narrative  . Not on file    Review of Systems: See HPI, otherwise negative ROS  Physical Exam: BP 136/86   Pulse 90   Temp 98.6 F (37 C) (Tympanic)   Resp 16   Ht 4\' 10"  (1.473 m)   Wt 120 lb (54.4 kg)   SpO2 98%   BMI 25.08 kg/m  General:   Alert,  pleasant and cooperative in NAD Head:  Normocephalic and atraumatic. Neck:  Supple; no masses or thyromegaly. Lungs:  Clear throughout to auscultation, normal respiratory effort.    Heart:  +S1, +S2, Regular rate and rhythm, No edema. Abdomen:  Soft, nontender and nondistended. Normal bowel sounds, without guarding, and without rebound.   Neurologic:  Alert and  oriented x4;  grossly normal neurologically.  Impression/Plan: Caitlyn Evans is here for a EGD for dysphagia.  Risks, benefits, limitations, and alternatives regarding  colonoscopy have been reviewed with the patient.  Questions have been answered.  All parties agreeable.   Pasty Spillers, MD  11/05/2017, 8:39 AM

## 2017-11-05 NOTE — Progress Notes (Signed)
l °

## 2017-11-05 NOTE — Anesthesia Postprocedure Evaluation (Signed)
Anesthesia Post Note  Patient: Caitlyn Evans  Procedure(s) Performed: ESOPHAGOGASTRODUODENOSCOPY (EGD) WITH PROPOFOL (N/A )  Patient location during evaluation: Endoscopy Anesthesia Type: General Level of consciousness: awake and alert Pain management: pain level controlled Vital Signs Assessment: post-procedure vital signs reviewed and stable Respiratory status: spontaneous breathing, nonlabored ventilation, respiratory function stable and patient connected to nasal cannula oxygen Cardiovascular status: blood pressure returned to baseline and stable Postop Assessment: no apparent nausea or vomiting Anesthetic complications: no     Last Vitals:  Vitals:   11/05/17 0935 11/05/17 0945  BP: (!) 156/91 (!) 144/90  Pulse:    Resp:    Temp:    SpO2:      Last Pain:  Vitals:   11/05/17 0915  TempSrc: Tympanic                 Anihya Tuma Garry Heater Carrick Rijos

## 2017-11-05 NOTE — Anesthesia Procedure Notes (Signed)
Performed by: Jequan Shahin, CRNA Oxygen Delivery Method: Nasal cannula       

## 2017-11-05 NOTE — Transfer of Care (Signed)
Immediate Anesthesia Transfer of Care Note  Patient: Caitlyn Evans  Procedure(s) Performed: ESOPHAGOGASTRODUODENOSCOPY (EGD) WITH PROPOFOL (N/A )  Patient Location: PACU  Anesthesia Type:General  Level of Consciousness: awake  Airway & Oxygen Therapy: Patient Spontanous Breathing  Post-op Assessment: Report given to RN  Post vital signs: Reviewed and stable  Last Vitals:  Vitals:   11/05/17 0826  BP: 136/86  Pulse: 90  Resp: 16  Temp: 37 C  SpO2: 98%    Last Pain:  Vitals:   11/05/17 0826  TempSrc: Tympanic         Complications: No apparent anesthesia complications

## 2017-11-06 ENCOUNTER — Encounter: Payer: Self-pay | Admitting: Gastroenterology

## 2017-11-10 LAB — SURGICAL PATHOLOGY

## 2017-11-14 ENCOUNTER — Encounter: Payer: Self-pay | Admitting: Gastroenterology

## 2017-11-17 DIAGNOSIS — J441 Chronic obstructive pulmonary disease with (acute) exacerbation: Secondary | ICD-10-CM | POA: Diagnosis not present

## 2017-11-17 DIAGNOSIS — I43 Cardiomyopathy in diseases classified elsewhere: Secondary | ICD-10-CM | POA: Diagnosis not present

## 2017-11-17 DIAGNOSIS — R042 Hemoptysis: Secondary | ICD-10-CM | POA: Diagnosis not present

## 2017-11-17 DIAGNOSIS — J9610 Chronic respiratory failure, unspecified whether with hypoxia or hypercapnia: Secondary | ICD-10-CM | POA: Diagnosis not present

## 2017-11-17 DIAGNOSIS — I119 Hypertensive heart disease without heart failure: Secondary | ICD-10-CM | POA: Diagnosis not present

## 2017-11-18 ENCOUNTER — Emergency Department: Payer: Medicare Other

## 2017-11-18 ENCOUNTER — Other Ambulatory Visit: Payer: Self-pay

## 2017-11-18 ENCOUNTER — Inpatient Hospital Stay
Admission: EM | Admit: 2017-11-18 | Discharge: 2017-11-25 | DRG: 208 | Disposition: A | Discharge status: Home / Self Care | Payer: Medicare Other | Attending: Internal Medicine | Admitting: Internal Medicine

## 2017-11-18 DIAGNOSIS — J1008 Influenza due to other identified influenza virus with other specified pneumonia: Secondary | ICD-10-CM | POA: Diagnosis present

## 2017-11-18 DIAGNOSIS — I5032 Chronic diastolic (congestive) heart failure: Secondary | ICD-10-CM | POA: Diagnosis not present

## 2017-11-18 DIAGNOSIS — F17210 Nicotine dependence, cigarettes, uncomplicated: Secondary | ICD-10-CM | POA: Diagnosis present

## 2017-11-18 DIAGNOSIS — R0600 Dyspnea, unspecified: Secondary | ICD-10-CM | POA: Diagnosis not present

## 2017-11-18 DIAGNOSIS — Z79899 Other long term (current) drug therapy: Secondary | ICD-10-CM

## 2017-11-18 DIAGNOSIS — Z9981 Dependence on supplemental oxygen: Secondary | ICD-10-CM | POA: Diagnosis not present

## 2017-11-18 DIAGNOSIS — I110 Hypertensive heart disease with heart failure: Secondary | ICD-10-CM | POA: Diagnosis present

## 2017-11-18 DIAGNOSIS — J441 Chronic obstructive pulmonary disease with (acute) exacerbation: Secondary | ICD-10-CM | POA: Diagnosis not present

## 2017-11-18 DIAGNOSIS — E7800 Pure hypercholesterolemia, unspecified: Secondary | ICD-10-CM | POA: Diagnosis present

## 2017-11-18 DIAGNOSIS — J9621 Acute and chronic respiratory failure with hypoxia: Secondary | ICD-10-CM | POA: Diagnosis not present

## 2017-11-18 DIAGNOSIS — J189 Pneumonia, unspecified organism: Secondary | ICD-10-CM

## 2017-11-18 DIAGNOSIS — J440 Chronic obstructive pulmonary disease with acute lower respiratory infection: Secondary | ICD-10-CM | POA: Diagnosis present

## 2017-11-18 DIAGNOSIS — T380X5A Adverse effect of glucocorticoids and synthetic analogues, initial encounter: Secondary | ICD-10-CM | POA: Diagnosis present

## 2017-11-18 DIAGNOSIS — K219 Gastro-esophageal reflux disease without esophagitis: Secondary | ICD-10-CM | POA: Diagnosis present

## 2017-11-18 DIAGNOSIS — I252 Old myocardial infarction: Secondary | ICD-10-CM | POA: Diagnosis not present

## 2017-11-18 DIAGNOSIS — I248 Other forms of acute ischemic heart disease: Secondary | ICD-10-CM | POA: Diagnosis present

## 2017-11-18 DIAGNOSIS — Z4659 Encounter for fitting and adjustment of other gastrointestinal appliance and device: Secondary | ICD-10-CM

## 2017-11-18 DIAGNOSIS — R739 Hyperglycemia, unspecified: Secondary | ICD-10-CM | POA: Diagnosis present

## 2017-11-18 DIAGNOSIS — J9601 Acute respiratory failure with hypoxia: Secondary | ICD-10-CM | POA: Diagnosis not present

## 2017-11-18 DIAGNOSIS — I10 Essential (primary) hypertension: Secondary | ICD-10-CM | POA: Diagnosis not present

## 2017-11-18 DIAGNOSIS — F410 Panic disorder [episodic paroxysmal anxiety] without agoraphobia: Secondary | ICD-10-CM | POA: Diagnosis present

## 2017-11-18 DIAGNOSIS — R339 Retention of urine, unspecified: Secondary | ICD-10-CM | POA: Diagnosis not present

## 2017-11-18 DIAGNOSIS — Z4682 Encounter for fitting and adjustment of non-vascular catheter: Secondary | ICD-10-CM | POA: Diagnosis not present

## 2017-11-18 DIAGNOSIS — R110 Nausea: Secondary | ICD-10-CM

## 2017-11-18 DIAGNOSIS — I509 Heart failure, unspecified: Secondary | ICD-10-CM | POA: Diagnosis not present

## 2017-11-18 DIAGNOSIS — R9431 Abnormal electrocardiogram [ECG] [EKG]: Secondary | ICD-10-CM | POA: Diagnosis not present

## 2017-11-18 DIAGNOSIS — J9600 Acute respiratory failure, unspecified whether with hypoxia or hypercapnia: Secondary | ICD-10-CM | POA: Diagnosis not present

## 2017-11-18 DIAGNOSIS — R0602 Shortness of breath: Secondary | ICD-10-CM | POA: Diagnosis not present

## 2017-11-18 LAB — GLUCOSE, CAPILLARY: Glucose-Capillary: 114 mg/dL — ABNORMAL HIGH (ref 65–99)

## 2017-11-18 LAB — PROCALCITONIN: Procalcitonin: <0.1 ng/mL

## 2017-11-18 LAB — DIFFERENTIAL
BASOS PCT: 0 %
Basophils Absolute: 0 10*3/uL (ref 0–0.1)
EOS PCT: 0 %
Eosinophils Absolute: 0 10*3/uL (ref 0–0.7)
LYMPHS PCT: 2 %
Lymphs Abs: 0.4 10*3/uL — ABNORMAL LOW (ref 1.0–3.6)
Monocytes Absolute: 1.3 10*3/uL — ABNORMAL HIGH (ref 0.2–0.9)
Monocytes Relative: 7 %
NEUTROS PCT: 91 %
Neutro Abs: 15.7 10*3/uL — ABNORMAL HIGH (ref 1.4–6.5)

## 2017-11-18 LAB — TROPONIN I: TROPONIN I: 0.04 ng/mL — AB (ref <0.03)

## 2017-11-18 LAB — CBC
HCT: 44.8 % (ref 35.0–47.0)
Hemoglobin: 14.8 g/dL (ref 12.0–16.0)
MCH: 28.5 pg (ref 26.0–34.0)
MCHC: 33.1 g/dL (ref 32.0–36.0)
MCV: 86.1 fL (ref 80.0–100.0)
Platelets: 324 10*3/uL (ref 150–440)
RBC: 5.2 MIL/uL (ref 3.80–5.20)
RDW: 17.4 % — ABNORMAL HIGH (ref 11.5–14.5)
WBC: 17.2 10*3/uL — ABNORMAL HIGH (ref 3.6–11.0)

## 2017-11-18 LAB — BASIC METABOLIC PANEL
ANION GAP: 15 (ref 5–15)
BUN: 17 mg/dL (ref 6–20)
CALCIUM: 8.8 mg/dL — AB (ref 8.9–10.3)
CO2: 23 mmol/L (ref 22–32)
Chloride: 103 mmol/L (ref 101–111)
Creatinine, Ser: 0.74 mg/dL (ref 0.44–1.00)
GFR calc Af Amer: >60 mL/min (ref >60)
GFR calc non Af Amer: >60 mL/min (ref >60)
Glucose, Bld: 116 mg/dL — ABNORMAL HIGH (ref 65–99)
POTASSIUM: 4 mmol/L (ref 3.5–5.1)
SODIUM: 141 mmol/L (ref 135–145)

## 2017-11-18 LAB — LACTIC ACID, PLASMA
Lactic Acid, Venous: 1.7 mmol/L (ref 0.5–1.9)
Lactic Acid, Venous: 1.1 mmol/L (ref 0.5–1.9)

## 2017-11-18 LAB — BRAIN NATRIURETIC PEPTIDE: B NATRIURETIC PEPTIDE 5: 52 pg/mL (ref 0.0–100.0)

## 2017-11-18 LAB — INFLUENZA PANEL BY PCR (TYPE A & B)
INFLAPCR: POSITIVE — AB
Influenza B By PCR: NEGATIVE

## 2017-11-18 LAB — PROTIME-INR
INR: 0.94
Prothrombin Time: 12.5 seconds (ref 11.4–15.2)

## 2017-11-18 LAB — MRSA PCR SCREENING: MRSA by PCR: NEGATIVE

## 2017-11-18 MED ORDER — ALPRAZOLAM 0.5 MG PO TABS
0.5000 mg | ORAL_TABLET | Freq: Three times a day (TID) | ORAL | Status: DC
  Administered 2017-11-18: 0.5 mg via ORAL
  Filled 2017-11-18: qty 1

## 2017-11-18 MED ORDER — DONEPEZIL HCL 5 MG PO TABS
10.0000 mg | ORAL_TABLET | Freq: Every day | ORAL | Status: DC
  Administered 2017-11-18: 10 mg via ORAL
  Filled 2017-11-18 (×3): qty 2

## 2017-11-18 MED ORDER — LEVOFLOXACIN IN D5W 750 MG/150ML IV SOLN
750.0000 mg | Freq: Once | INTRAVENOUS | Status: AC
Start: 2017-11-18 — End: 2017-11-18
  Administered 2017-11-18: 750 mg via INTRAVENOUS
  Filled 2017-11-18: qty 150

## 2017-11-18 MED ORDER — MAGNESIUM SULFATE 2 GM/50ML IV SOLN
INTRAVENOUS | Status: AC
  Filled 2017-11-18: qty 50

## 2017-11-18 MED ORDER — ENOXAPARIN SODIUM 40 MG/0.4ML ~~LOC~~ SOLN
40.0000 mg | SUBCUTANEOUS | Status: DC
  Administered 2017-11-19 – 2017-11-21 (×3): 40 mg via SUBCUTANEOUS
  Filled 2017-11-18 (×7): qty 0.4

## 2017-11-18 MED ORDER — SODIUM CHLORIDE 0.9% FLUSH: 3.0000 mL | INTRAVENOUS | Status: DC | PRN

## 2017-11-18 MED ORDER — LORAZEPAM 2 MG/ML IJ SOLN
1.0000 mg | INTRAMUSCULAR | Status: DC | PRN
  Administered 2017-11-18: 2 mg via INTRAVENOUS
  Filled 2017-11-18: qty 1

## 2017-11-18 MED ORDER — SENNOSIDES-DOCUSATE SODIUM 8.6-50 MG PO TABS: 1.0000 | ORAL_TABLET | Freq: Every evening | ORAL | Status: DC | PRN

## 2017-11-18 MED ORDER — LORAZEPAM 2 MG/ML IJ SOLN
1.0000 mg | Freq: Once | INTRAMUSCULAR | Status: AC
  Administered 2017-11-18: 1 mg via INTRAVENOUS

## 2017-11-18 MED ORDER — IPRATROPIUM-ALBUTEROL 0.5-2.5 (3) MG/3ML IN SOLN
3.0000 mL | Freq: Four times a day (QID) | RESPIRATORY_TRACT | Status: DC
  Administered 2017-11-18: 3 mL via RESPIRATORY_TRACT
  Filled 2017-11-18: qty 3

## 2017-11-18 MED ORDER — IPRATROPIUM-ALBUTEROL 0.5-2.5 (3) MG/3ML IN SOLN
3.0000 mL | RESPIRATORY_TRACT | Status: DC
  Administered 2017-11-19 – 2017-11-23 (×26): 3 mL via RESPIRATORY_TRACT
  Filled 2017-11-18 (×25): qty 3

## 2017-11-18 MED ORDER — IPRATROPIUM-ALBUTEROL 0.5-2.5 (3) MG/3ML IN SOLN: 3.0000 mL | Freq: Four times a day (QID) | RESPIRATORY_TRACT | Status: DC | PRN

## 2017-11-18 MED ORDER — ONDANSETRON HCL 4 MG/2ML IJ SOLN
4.0000 mg | Freq: Four times a day (QID) | INTRAMUSCULAR | Status: DC | PRN
  Administered 2017-11-22 – 2017-11-23 (×2): 4 mg via INTRAVENOUS
  Filled 2017-11-18 (×2): qty 2

## 2017-11-18 MED ORDER — LORAZEPAM 2 MG/ML IJ SOLN
INTRAMUSCULAR | Status: AC
  Filled 2017-11-18: qty 1

## 2017-11-18 MED ORDER — SODIUM CHLORIDE 0.9 % IV SOLN: 250.0000 mL | INTRAVENOUS | Status: DC | PRN

## 2017-11-18 MED ORDER — ROSUVASTATIN CALCIUM 10 MG PO TABS
10.0000 mg | ORAL_TABLET | Freq: Every day | ORAL | Status: DC
  Administered 2017-11-18: 10 mg via ORAL
  Filled 2017-11-18: qty 1

## 2017-11-18 MED ORDER — LORAZEPAM 2 MG/ML IJ SOLN
INTRAMUSCULAR | Status: AC
Start: 2017-11-18 — End: 2017-11-19
  Filled 2017-11-18: qty 1

## 2017-11-18 MED ORDER — SODIUM CHLORIDE 0.9% FLUSH
3.0000 mL | Freq: Two times a day (BID) | INTRAVENOUS | Status: DC
  Administered 2017-11-18 – 2017-11-25 (×14): 3 mL via INTRAVENOUS

## 2017-11-18 MED ORDER — ACETAMINOPHEN 650 MG RE SUPP: 650.0000 mg | Freq: Four times a day (QID) | RECTAL | Status: DC | PRN

## 2017-11-18 MED ORDER — METHYLPREDNISOLONE SODIUM SUCC 125 MG IJ SOLR
60.0000 mg | Freq: Four times a day (QID) | INTRAMUSCULAR | Status: DC
  Administered 2017-11-18 – 2017-11-25 (×27): 60 mg via INTRAVENOUS
  Filled 2017-11-18 (×27): qty 2

## 2017-11-18 MED ORDER — ACETAMINOPHEN 325 MG PO TABS
650.0000 mg | ORAL_TABLET | Freq: Four times a day (QID) | ORAL | Status: DC | PRN
  Administered 2017-11-22 – 2017-11-23 (×2): 650 mg via ORAL
  Filled 2017-11-18 (×2): qty 2

## 2017-11-18 MED ORDER — LEVOFLOXACIN 500 MG PO TABS: 500.0000 mg | ORAL_TABLET | Freq: Every day | ORAL | Status: DC

## 2017-11-18 MED ORDER — BUDESONIDE 0.25 MG/2ML IN SUSP
0.2500 mg | Freq: Two times a day (BID) | RESPIRATORY_TRACT | Status: DC
  Administered 2017-11-19 – 2017-11-25 (×13): 0.25 mg via RESPIRATORY_TRACT
  Filled 2017-11-18 (×13): qty 2

## 2017-11-18 MED ORDER — ONDANSETRON HCL 4 MG PO TABS: 4.0000 mg | ORAL_TABLET | Freq: Four times a day (QID) | ORAL | Status: DC | PRN

## 2017-11-18 MED ORDER — LEVOFLOXACIN IN D5W 500 MG/100ML IV SOLN
500.0000 mg | INTRAVENOUS | Status: DC
  Administered 2017-11-19: 500 mg via INTRAVENOUS
  Filled 2017-11-18 (×2): qty 100

## 2017-11-18 MED ORDER — PANTOPRAZOLE SODIUM 40 MG PO TBEC
40.0000 mg | DELAYED_RELEASE_TABLET | Freq: Two times a day (BID) | ORAL | Status: DC
  Filled 2017-11-18: qty 1

## 2017-11-18 MED ORDER — MAGNESIUM SULFATE 2 GM/50ML IV SOLN
2.0000 g | Freq: Once | INTRAVENOUS | Status: AC
  Administered 2017-11-18: 2 g via INTRAVENOUS

## 2017-11-18 MED ORDER — NICOTINE 21 MG/24HR TD PT24
21.0000 mg | MEDICATED_PATCH | Freq: Every day | TRANSDERMAL | Status: DC
  Administered 2017-11-18: 21 mg via TRANSDERMAL
  Filled 2017-11-18: qty 1

## 2017-11-18 NOTE — ED Notes (Signed)
Date and time results received: 11/18/17 1829   Test: troponin Critical Value: 0.04  Name of Provider Notified: Dr. Darnelle CatalanMalinda

## 2017-11-18 NOTE — ED Notes (Signed)
Admitting at bedside 

## 2017-11-18 NOTE — Consult Note (Addendum)
Pharmacy Antibiotic Note  Caitlyn Evans is a 52 y.o. female admitted on 11/18/2017 with AECOPD. Pharmacy has been consulted for levofloxacin dosing. Patient received levofloxacin 750mg  IV x 1 dose in ED.  Plan: Start Levofloxacin 500mg  every 24 hours.  PCT ordered.  Height: 4\' 10"  (147.3 cm) Weight: 121 lb 3.2 oz (55 kg) IBW/kg (Calculated) : 40.9  Temp (24hrs), Avg:96.1 F (35.6 C), Min:96.1 F (35.6 C), Max:96.1 F (35.6 C)  Recent Labs  Lab 11/18/17 1729 11/18/17 1731  WBC  --  17.2*  CREATININE  --  0.74  LATICACIDVEN 1.7  --     Estimated Creatinine Clearance: 59.7 mL/min (by C-G formula based on SCr of 0.74 mg/dL).    Allergies  Allergen Reactions  . Penicillins Hives and Other (See Comments)    Has patient had a PCN reaction causing immediate rash, facial/tongue/throat swelling, SOB or lightheadedness with hypotension: No Has patient had a PCN reaction causing severe rash involving mucus membranes or skin necrosis: No Has patient had a PCN reaction that required hospitalization No Has patient had a PCN reaction occurring within the last 10 years: No If all of the above answers are "NO", then may proceed with Cephalosporin use. Other reaction(s): Other (See Comments) Has patient had a PCN reaction causing immediate rash, facial/tongue/throat swelling, SOB or lightheadedness with hypotension: No Has patient had a PCN reaction causing severe rash involving mucus membranes or skin necrosis: No Has patient had a PCN reaction that required hospitalization No Has patient had a PCN reaction occurring within the last 10 years: No If all of the above answers are "NO", then may proceed with Cephalosporin use.  . Codeine Hives, Nausea And Vomiting and Nausea Only    Antimicrobials this admission: 3/5 levofloxacin >>   Dose adjustments this admission:  Microbiology results: 3/5  BCx: pending  Thank you for allowing pharmacy to be a part of this patient's care.  Gardner CandleSheema  M Christan Ciccarelli, PharmD, BCPS Clinical Pharmacist 11/18/2017 7:47 PM

## 2017-11-18 NOTE — Progress Notes (Signed)
Pt transported to ICU 17 on Bipap without incident. Report given to ICU RT.

## 2017-11-18 NOTE — Consult Note (Signed)
Name: Caitlyn Evans MRN: [id number] DOB: [birth year]    ADMISSION DATE:  11/18/2017 CONSULTATION DATE: 11/18/2017  REFERRING MD : Dr. Tobi Bastos   CHIEF COMPLAINT: Shortness of Breath   BRIEF PATIENT DESCRIPTION: 52 yo female admitted with acute on chronic respiratory failure secondary to AECOPD requiring Bipap   SIGNIFICANT EVENTS  03/5-Pt admitted to stepdown unit   STUDIES:  None   HISTORY OF PRESENT ILLNESS:   This is a 52 yo female with a PMH of Seasonal Allergies, MI, Lung Mass, HTN, Hypercholesterolemia, COPD, Dysphagia, CHF, BOOP, Asthma, Current Everyday Smoker, and Anxiety. She presented to [facility] ER 03/5 with shortness of breath and cough onset of symptoms 4 days prior to presentation.  Per ER notes pt has used a total of 15 breathing treatments over the past few days without relief of symptoms.  In the ER she was in the tripod position in severe respiratory distress on CPAP, therefore she was transitioned to Bipap.  CXR revealed stable COPD and BNP 52.  She was subsequently admitted to the stepdown unit for further workup and treatment.   PAST MEDICAL HISTORY :   has a past medical history of Anxiety, Asthma, BOOP (bronchiolitis obliterans with organizing pneumonia) (HCC), CHF (congestive heart failure) (HCC), Dysphagia, pharyngoesophageal phase (05/30/2015), Emphysema lung (HCC) (05/30/2015), Hypercholesterolemia, Hypertension, Lung mass, Migraines, Motion sickness, Myocardial infarction (HCC) (2012), Seasonal allergies, and Shortness of breath dyspnea.  has a past surgical history that includes Vaginal hysterectomy; Lung surgery (Left); Oophorectomy (Left); Colonoscopy with propofol (N/A, 06/02/2015); Esophagogastroduodenoscopy (egd) with propofol (N/A, 06/02/2015); and Esophagogastroduodenoscopy (egd) with propofol (N/A, 11/05/2017). Prior to Admission medications   Medication Sig Start Date End Date Taking? Authorizing Provider  ALPRAZolam Prudy Feeler) 0.5 MG tablet Take 0.5 mg by mouth  3 (three) times daily.    Yes [provider]  donepezil (ARICEPT) 10 MG tablet Take 10 mg by mouth daily.    Yes [provider]  fluconazole (DIFLUCAN) 200 MG tablet Take 2 tablets (400 mg total) by mouth daily for 1 day, THEN 1 tablet (200 mg total) daily for 14 days. 11/05/17 11/20/17 Yes Tahiliani, Dolphus Jenny, MD  ipratropium-albuterol (DUONEB) 0.5-2.5 (3) MG/3ML SOLN Inhale 3 mLs into the lungs every 4 (four) hours as needed (for wheezing/shortness of breath). Patient taking differently: Inhale 3 mLs into the lungs every 4 (four) hours as needed. For wheezing/shortness of breath 05/13/16  Yes Enid Baas, MD  pantoprazole (PROTONIX) 40 MG tablet Take 1 tablet (40 mg total) by mouth 2 (two) times daily. 10/22/17  Yes Pasty Spillers, MD  predniSONE (DELTASONE) 10 MG tablet Take 10 mg by mouth 3 (three) times daily.   Yes [provider]  rosuvastatin (CRESTOR) 10 MG tablet Take 10 mg by mouth daily.  10/10/17  Yes [provider]  budesonide (PULMICORT) 0.25 MG/2ML nebulizer solution Take 2 mLs (0.25 mg total) by nebulization every 6 (six) hours. Patient not taking: Reported on 11/18/2017 07/04/17   Altamese Dilling, MD   Allergies  Allergen Reactions  . Penicillins Hives and Other (See Comments)    Has patient had a PCN reaction causing immediate rash, facial/tongue/throat swelling, SOB or lightheadedness with hypotension: No Has patient had a PCN reaction causing severe rash involving mucus membranes or skin necrosis: No Has patient had a PCN reaction that required hospitalization No Has patient had a PCN reaction occurring within the last 10 years: No If all of the above answers are "NO", then may proceed with Cephalosporin use. Other reaction(s):  Other (See Comments) Has patient had a PCN reaction causing immediate rash, facial/tongue/throat swelling, SOB or lightheadedness with hypotension: No Has patient had a PCN reaction causing severe  rash involving mucus membranes or skin necrosis: No Has patient had a PCN reaction that required hospitalization No Has patient had a PCN reaction occurring within the last 10 years: No If all of the above answers are "NO", then may proceed with Cephalosporin use.  . Codeine Hives, Nausea And Vomiting and Nausea Only    FAMILY HISTORY:  family history includes COPD in her father and mother; Heart attack in her mother; Stroke in her father. SOCIAL HISTORY:  reports that she has been smoking cigarettes.  She has a 30.00 pack-year smoking history. she has never used smokeless tobacco. She reports that she does not drink alcohol or use drugs.  REVIEW OF SYSTEMS:   Unable to assess pt in respiratory distress on Bipap   SUBJECTIVE:  Unable to assess pt in respiratory distress on Bipap   VITAL SIGNS: Temp:  [96.1 F (35.6 C)-97.7 F (36.5 C)] 97.7 F (36.5 C) (03/05 2000) Pulse Rate:  [103-129] 103 (03/05 2049) Resp:  [18-45] 18 (03/05 2049) BP: (101-158)/(75-134) 143/83 (03/05 2000) SpO2:  [96 %-99 %] 97 % (03/05 2054) Weight:  [54.4 kg (119 lb 14.9 oz)-55 kg (121 lb 3.2 oz)] 54.4 kg (119 lb 14.9 oz) (03/05 2000)  PHYSICAL EXAMINATION: General: acutely ill appearing female, on Bipap  Neuro: alert and oriented, follows commands  HEENT: supple, no JVD  Cardiovascular: nsr, s1s2, no M/R/G  Lungs: diffuse wheezes throughout, slightly labored and tachypneic  Abdomen: +BS x4, soft, non tender, non distended  Musculoskeletal: normal bulk and tone, no edema  Skin: intact no rashes or lesions  Recent Labs  Lab 11/18/17 1731  NA 141  K 4.0  CL 103  CO2 23  BUN 17  CREATININE 0.74  GLUCOSE 116*   Recent Labs  Lab 11/18/17 1731  HGB 14.8  HCT 44.8  WBC 17.2*  PLT 324   Dg Chest Portable 1 View  Result Date: 11/18/2017 CLINICAL DATA:  Worsening shortness of breath for 4 days. History of COPD, smoker, LEFT upper lobectomy. EXAM: PORTABLE CHEST 1 VIEW COMPARISON:  Chest  radiograph October 09, 2017 FINDINGS: Cardiomediastinal silhouette is normal, calcified aortic knob. Increased lung volumes with similar biapical pleuroparenchymal scarring. LEFT upper lung surgical staple line. No pleural effusion. No pneumothorax. Soft tissue planes and included osseous structures are non suspicious IMPRESSION: Stable COPD with biapical pleuroparenchymal scarring. Postsurgical changes LEFT upper lobe. Aortic Atherosclerosis (ICD10-I70.0). Electronically Signed   By: Awilda Metroourtnay  Bloomer M.D.   On: 11/18/2017 17:49    ASSESSMENT / PLAN: Acute on chronic respiratory failure secondary to AECOPD  Mildly elevated troponin's likely secondary to demand ischemia in setting of respiratory failure  Current everyday smoker Hx: MI, HTN, Lung Mass, Dysphagia, Asthma, BOOP, and Anxiety P: Prn Bipap for dyspnea and/or hypoxia Continue scheduled and prn bronchodilator therapy  Nebulized and iv steroids  Prn CXR  Continuous telemetry monitoring  Trend troponin's  Trend WBC and monitor fever curve Trend PCT  Follow cultures r/o Influenza   Continue abx Trend BMP  Replace electrolytes as indicated  Monitor UOP  Subq lovenox for VTE prophylaxis  Trend CBC Monitor for s/sx of bleeding and transfuse for hgb <7 Nicotine patch and smoking cessation counseling provided  Prn ativan and scheduled xanax for anxiety  HIGH RISK FOR INTUBATION   Sonda Rumbleana Alissah Redmon, AGNP  Pulmonary/Critical Care Pager  [phone] (please enter 7 digits) PCCM Consult Pager [phone] (please enter 7 digits)

## 2017-11-18 NOTE — ED Triage Notes (Signed)
Pt arrives emergency traffic via [facility] from home. Hx COPD. Tripoding with accessory muscle use upon arrival. 15 breathing tx at home. Ems gave  duonebs and 125 solumedrol. Pt keeps repeating "I can't breath." arrived on cpap, changed to bipap. Diminished lung sounds. Coughing. Denies fever. Diaphoretic. States breathing difficulty x 4 days.

## 2017-11-18 NOTE — ED Provider Notes (Signed)
[facility] Emergency Department Provider Note   ____________________________________________   First MD Initiated Contact with Patient 11/18/17 1735     (approximate)  I have reviewed the triage vital signs and the nursing notes.   HISTORY  Chief Complaint Respiratory Distress    HPI Caitlyn Evans is a 52 y.o. female With history of COPD who has had 4 days of increasing shortness of breath with cough. She is used 15 breathing treatments in the last few days. She is able to talk in3-4 word sentences she is tripoding while on CPAP.   Past Medical History:  Diagnosis Date  . Anxiety    panic attacks  . Asthma   . BOOP (bronchiolitis obliterans with organizing pneumonia) (HCC)   . CHF (congestive heart failure) (HCC)   . Dysphagia, pharyngoesophageal phase 05/30/2015  . Emphysema lung (HCC) 05/30/2015  . Hypercholesterolemia   . Hypertension   . Lung mass   . Migraines   . Motion sickness    all moving vehicles  . Myocardial infarction (HCC) 2012  . Seasonal allergies   . Shortness of breath dyspnea    1 flight-stairs    Patient Active Problem List   Diagnosis Date Noted  . Esophagitis, unspecified   . Stomach irritation   . Hiatal hernia   . COPD (chronic obstructive pulmonary disease) (HCC) 07/19/2016  . Acute on chronic respiratory failure (HCC) 05/09/2016  . Noncompliance with medication regimen 05/09/2016  . COPD with acute exacerbation (HCC) 02/05/2016  . Acute exacerbation of chronic obstructive pulmonary disease (COPD) (HCC)   . Acute respiratory failure (HCC)   . Acute respiratory failure with hypoxia (HCC) 12/05/2015  . BOOP (bronchiolitis obliterans with organizing pneumonia) (HCC) 12/05/2015  . Depression, major, single episode, moderate (HCC) 07/14/2015  . Adjustment disorder with anxiety 07/14/2015  . COPD exacerbation (HCC) 07/14/2015  . Special screening for malignant neoplasms, colon   . Swallowing difficulty   .  Loss of weight   . Esophagogastric ulcer   . Feline esophagus   . Hypertension 05/30/2015  . Emphysema lung (HCC) 05/30/2015  . Dysphagia, pharyngoesophageal phase 05/30/2015    Past Surgical History:  Procedure Laterality Date  . COLONOSCOPY WITH PROPOFOL N/A 06/02/2015   Procedure: COLONOSCOPY WITH PROPOFOL;  Surgeon: Midge Minium, MD;  Location: [facility];  Service: Endoscopy;  Laterality: N/A;  . ESOPHAGOGASTRODUODENOSCOPY (EGD) WITH PROPOFOL N/A 06/02/2015   Procedure: ESOPHAGOGASTRODUODENOSCOPY (EGD) WITH PROPOFOL withdialation;  Surgeon: Midge Minium, MD;  Location: [facility];  Service: Endoscopy;  Laterality: N/A;  . ESOPHAGOGASTRODUODENOSCOPY (EGD) WITH PROPOFOL N/A 11/05/2017   Procedure: ESOPHAGOGASTRODUODENOSCOPY (EGD) WITH PROPOFOL;  Surgeon: Pasty Spillers, MD;  Location: [facility];  Service: Endoscopy;  Laterality: N/A;  . LUNG SURGERY Left    Upper lobe removed  . OOPHORECTOMY Left   . VAGINAL HYSTERECTOMY      Prior to Admission medications   Medication Sig Start Date End Date Taking? Authorizing Provider  ALPRAZolam (XANAX) 0.25 MG tablet Take 0.25 mg by mouth 2 (two) times daily.     [provider]  budesonide (PULMICORT) 0.25 MG/2ML nebulizer solution Take 2 mLs (0.25 mg total) by nebulization every 6 (six) hours. 07/04/17   Altamese Dilling, MD  cholecalciferol (VITAMIN D) 1000 units tablet Take 2,000 Units by mouth daily.    [provider]  donepezil (ARICEPT) 10 MG tablet Take 10 mg by mouth daily.     [provider]  fluconazole (DIFLUCAN) 200 MG tablet Take  2 tablets (400 mg total) by mouth daily for 1 day, THEN 1 tablet (200 mg total) daily for 14 days. 11/05/17 11/20/17  Pasty Spillersahiliani, Varnita B, MD  ipratropium-albuterol (DUONEB) 0.5-2.5 (3) MG/3ML SOLN Inhale 3 mLs into the lungs every 4 (four) hours as needed (for wheezing/shortness of breath). Patient taking differently: Inhale 3 mLs into the lungs  every 4 (four) hours as needed. For wheezing/shortness of breath 05/13/16   Enid BaasKalisetti, Radhika, MD  Multiple Vitamins-Minerals (MULTIVITAMIN GUMMIES ADULT) CHEW Chew 2 each by mouth daily.     [provider]  pantoprazole (PROTONIX) 40 MG tablet Take 1 tablet (40 mg total) by mouth 2 (two) times daily. 10/22/17   Pasty Spillersahiliani, Varnita B, MD  predniSONE (STERAPRED UNI-PAK 21 TAB) 10 MG (21) TBPK tablet Take 6 tabs first day, 5 tab on day 2, then 4 on day 3rd, 3 tabs on day 4th , 2 tab on day 5th, and 1 tab on 6th day. 07/04/17   Altamese DillingVachhani, Vaibhavkumar, MD  rosuvastatin (CRESTOR) 10 MG tablet  10/10/17   [provider]    Allergies Penicillins and Codeine  Family History  Problem Relation Age of Onset  . COPD Mother   . Heart attack Mother   . Stroke Father   . COPD Father     Social History Social History   Tobacco Use  . Smoking status: Current Every Day Smoker    Packs/day: 1.00    Years: 30.00    Pack years: 30.00    Types: Cigarettes  . Smokeless tobacco: Never Used  . Tobacco comment: quit some time in 2012  Substance Use Topics  . Alcohol use: No    Alcohol/week: 0.0 oz    Comment: rare consumption  . Drug use: No    Review of Systems  Constitutional: No fever/chills Eyes: No visual changes. ENT: No sore throat. Cardiovascular: Denies chest pain. Respiratory:  shortness of breath. Gastrointestinal: No abdominal pain.  No nausea, no vomiting.  No diarrhea.  No constipation. Genitourinary: Negative for dysuria. Musculoskeletal: Negative for back pain. Skin: Negative for rash. Neurological: Negative for headaches, focal weakness   ____________________________________________   PHYSICAL EXAM:  VITAL SIGNS: ED Triage Vitals [11/18/17 1729]  Enc Vitals Group     BP      Pulse Rate (!) 128     Resp (!) 27     Temp      Temp src      SpO2 99 %     Weight 121 lb 3.2 oz (55 kg)     Height 4\' 10"  (1.473 m)     Head Circumference      Peak Flow       Pain Score      Pain Loc      Pain Edu?      Excl. in GC?     Constitutional: Alert and oriented. anxious and in respiratory distress. Eyes: Conjunctivae are normal.  Head: Atraumatic. Nose: No congestion/rhinnorhea. Mouth/Throat: Mucous membranes are moist.   Neck: No stridor   Cardiovascular:rapidrate, regular rhythm. Grossly normal heart sounds.  Good peripheral circulation. Respiratory: increased respiratory effort.   retractions. Lungs tight with wheezes Gastrointestinal: Soft and nontender. No distention. No abdominal bruits. No CVA tenderness. Musculoskeletal: No lower extremity tenderness nor edema.  No joint effusions. Neurologic:  Normal speech and language. No gross focal neurologic deficits are appreciated. . Skin:  Skin is warm, dry and intact. No rash noted. Psychiatric: very anxious Speech and behavior are normal.  ____________________________________________   LABS (all labs ordered are listed, but only abnormal results are displayed)  Labs Reviewed  BASIC METABOLIC PANEL - Abnormal; Notable for the following components:      Result Value   Glucose, Bld 116 (*)    Calcium 8.8 (*)    All other components within normal limits  CBC - Abnormal; Notable for the following components:   WBC 17.2 (*)    RDW 17.4 (*)    All other components within normal limits  CULTURE, BLOOD (ROUTINE X 2)  CULTURE, BLOOD (ROUTINE X 2)  BRAIN NATRIURETIC PEPTIDE  TROPONIN I  DIFFERENTIAL  LACTIC ACID, PLASMA  LACTIC ACID, PLASMA  BLOOD GAS, VENOUS  PROTIME-INR  PROCALCITONIN   ____________________________________________  EKG  EKG read and interpreted by me shows tongue sinus tachycardia rate of 128 normal axis probable right atrial enlargement ____________________________________________  RADIOLOGY  ED MD interpretation: chest x-ray reviewed by me shows hyperinflation there is some haziness at the left base  Official radiology report(s): No results  found.  ____________________________________________   PROCEDURES  Procedure(s) performed:   Procedures  Critical Care performed:   ____________________________________________   INITIAL IMPRESSION / ASSESSMENT AND PLAN / ED COURSE  patient had a high white count. She sweating. There is haziness on the chest x-ray. She has pneumonia. She is allergic to penicillin and will give her some Levaquin.     Clinical Course as of Nov 19 1747  Tue Nov 18, 2017  1746 Troponin I [PM]    Clinical Course User Index [PM] Arnaldo Natal, MD     ____________________________________________   FINAL CLINICAL IMPRESSION(S) / ED DIAGNOSES  Final diagnoses:  COPD exacerbation (HCC)  Healthcare-associated pneumonia     ED Discharge Orders    None       Note:  This document was prepared using Dragon voice recognition software and may include unintentional dictation errors.    Arnaldo Natal, MD 11/18/17 1750

## 2017-11-18 NOTE — H&P (Addendum)
[facility] [place] at [facility]   PATIENT NAME: Caitlyn Evans    MR#:  [id number]  DATE OF BIRTH:  [birth year]  DATE OF ADMISSION:  11/18/2017  PRIMARY CARE PHYSICIAN: Corky Downs, MD   REQUESTING/REFERRING PHYSICIAN:   CHIEF COMPLAINT:   Chief Complaint  Patient presents with  . Respiratory Distress    HISTORY OF PRESENT ILLNESS: Caitlyn Evans  is a 52 y.o. female with a known history of emphysema, congestive heart failure, boop, hyperlipidemia, hypertension presented to the emergency room with increased shortness of breath for the last couple of days.  She is on home oxygen at 3 L.  Patient was given multiple nebulization treatments she continued to wheeze and was put on BiPAP and stabilized.  Patient states she has stage III COPD and also had pneumonectomy in the left lung in the past.  Has bilateral wheezing and increased anxiety.  No complaints of any chest pain.  No fever and chills.  PAST MEDICAL HISTORY:   Past Medical History:  Diagnosis Date  . Anxiety    panic attacks  . Asthma   . BOOP (bronchiolitis obliterans with organizing pneumonia) (HCC)   . CHF (congestive heart failure) (HCC)   . Dysphagia, pharyngoesophageal phase 05/30/2015  . Emphysema lung (HCC) 05/30/2015  . Hypercholesterolemia   . Hypertension   . Lung mass   . Migraines   . Motion sickness    all moving vehicles  . Myocardial infarction (HCC) 2012  . Seasonal allergies   . Shortness of breath dyspnea    1 flight-stairs    PAST SURGICAL HISTORY:  Past Surgical History:  Procedure Laterality Date  . COLONOSCOPY WITH PROPOFOL N/A 06/02/2015   Procedure: COLONOSCOPY WITH PROPOFOL;  Surgeon: Midge Minium, MD;  Location: [facility];  Service: Endoscopy;  Laterality: N/A;  . ESOPHAGOGASTRODUODENOSCOPY (EGD) WITH PROPOFOL N/A 06/02/2015   Procedure: ESOPHAGOGASTRODUODENOSCOPY (EGD) WITH PROPOFOL withdialation;  Surgeon: Midge Minium, MD;  Location: [facility];  Service: Endoscopy;  Laterality: N/A;  . ESOPHAGOGASTRODUODENOSCOPY (EGD) WITH PROPOFOL N/A 11/05/2017   Procedure: ESOPHAGOGASTRODUODENOSCOPY (EGD) WITH PROPOFOL;  Surgeon: Pasty Spillers, MD;  Location: [facility];  Service: Endoscopy;  Laterality: N/A;  . LUNG SURGERY Left    Upper lobe removed  . OOPHORECTOMY Left   . VAGINAL HYSTERECTOMY      SOCIAL HISTORY:  Social History   Tobacco Use  . Smoking status: Current Every Day Smoker    Packs/day: 1.00    Years: 30.00    Pack years: 30.00    Types: Cigarettes  . Smokeless tobacco: Never Used  . Tobacco comment: quit some time in 2012  Substance Use Topics  . Alcohol use: No    Alcohol/week: 0.0 oz    Comment: rare consumption    FAMILY HISTORY:  Family History  Problem Relation Age of Onset  . COPD Mother   . Heart attack Mother   . Stroke Father   . COPD Father     DRUG ALLERGIES:  Allergies  Allergen Reactions  . Penicillins Hives and Other (See Comments)    Has patient had a PCN reaction causing immediate rash, facial/tongue/throat swelling, SOB or lightheadedness with hypotension: No Has patient had a PCN reaction causing severe rash involving mucus membranes or skin necrosis: No Has patient had a PCN reaction that required hospitalization No Has patient had a PCN reaction occurring within the last 10 years: No If all of the above answers are "NO", then may proceed  with Cephalosporin use. Other reaction(s): Other (See Comments) Has patient had a PCN reaction causing immediate rash, facial/tongue/throat swelling, SOB or lightheadedness with hypotension: No Has patient had a PCN reaction causing severe rash involving mucus membranes or skin necrosis: No Has patient had a PCN reaction that required hospitalization No Has patient had a PCN reaction occurring within the last 10 years: No If all of the above answers are "NO", then may proceed with Cephalosporin use.  . Codeine Hives, Nausea And  Vomiting and Nausea Only    REVIEW OF SYSTEMS:   CONSTITUTIONAL: No fever, fatigue or weakness.  EYES: No blurred or double vision.  EARS, NOSE, AND THROAT: No tinnitus or ear pain.  RESPIRATORY: Has cough, shortness of breath, wheezing  No hemoptysis.  CARDIOVASCULAR: No chest pain, orthopnea, edema.  GASTROINTESTINAL: No nausea, vomiting, diarrhea or abdominal pain.  GENITOURINARY: No dysuria, hematuria.  ENDOCRINE: No polyuria, nocturia,  HEMATOLOGY: No anemia, easy bruising or bleeding SKIN: No rash or lesion. MUSCULOSKELETAL: No joint pain or arthritis.   NEUROLOGIC: No tingling, numbness, weakness.  PSYCHIATRY: No anxiety or depression.   MEDICATIONS AT HOME:  Prior to Admission medications   Medication Sig Start Date End Date Taking? Authorizing Provider  ALPRAZolam Prudy Feeler(XANAX) 0.5 MG tablet Take 0.5 mg by mouth 3 (three) times daily.    Yes [provider]  donepezil (ARICEPT) 10 MG tablet Take 10 mg by mouth daily.    Yes [provider]  fluconazole (DIFLUCAN) 200 MG tablet Take 2 tablets (400 mg total) by mouth daily for 1 day, THEN 1 tablet (200 mg total) daily for 14 days. 11/05/17 11/20/17 Yes Tahiliani, Dolphus JennyVarnita B, MD  ipratropium-albuterol (DUONEB) 0.5-2.5 (3) MG/3ML SOLN Inhale 3 mLs into the lungs every 4 (four) hours as needed (for wheezing/shortness of breath). Patient taking differently: Inhale 3 mLs into the lungs every 4 (four) hours as needed. For wheezing/shortness of breath 05/13/16  Yes Enid BaasKalisetti, Radhika, MD  pantoprazole (PROTONIX) 40 MG tablet Take 1 tablet (40 mg total) by mouth 2 (two) times daily. 10/22/17  Yes Pasty Spillersahiliani, Varnita B, MD  predniSONE (DELTASONE) 10 MG tablet Take 10 mg by mouth 3 (three) times daily.   Yes [provider]  rosuvastatin (CRESTOR) 10 MG tablet Take 10 mg by mouth daily.  10/10/17  Yes [provider]  budesonide (PULMICORT) 0.25 MG/2ML nebulizer solution Take 2 mLs (0.25 mg total) by nebulization  every 6 (six) hours. Patient not taking: Reported on 11/18/2017 07/04/17   Altamese DillingVachhani, Vaibhavkumar, MD      PHYSICAL EXAMINATION:   VITAL SIGNS: Blood pressure 116/83, pulse (!) 112, temperature (!) 96.1 F (35.6 C), temperature source Axillary, resp. rate (!) 29, height 4\' 10"  (1.473 m), weight 55 kg (121 lb 3.2 oz), SpO2 99 %.  GENERAL:  52 y.o.-year-old patient lying in the bed with no acute distress.  EYES: Pupils equal, round, reactive to light and accommodation. No scleral icterus. Extraocular muscles intact.  HEENT: Head atraumatic, normocephalic. Oropharynx and nasopharynx clear.  NECK:  Supple, no jugular venous distention. No thyroid enlargement, no tenderness.  LUNGS: Decereased breath sounds bilaterally, bilateral wheezing.  Scattered rhonchi in both lungs On BIPAP IPAP : 15 EPAP : 5 Rate : 8 FIO2 : 30% CARDIOVASCULAR: S1, S2 tachycardia. No murmurs, rubs, or gallops.  ABDOMEN: Soft, nontender, nondistended. Bowel sounds present. No organomegaly or mass.  EXTREMITIES: No pedal edema, cyanosis, or clubbing.  NEUROLOGIC: Cranial nerves II through XII are intact. Muscle strength 5/5 in all extremities.  Sensation intact. Gait not checked.  PSYCHIATRIC: The patient is alert and oriented x 3.  SKIN: No obvious rash, lesion, or ulcer.   LABORATORY PANEL:   CBC Recent Labs  Lab 11/18/17 1731  WBC 17.2*  HGB 14.8  HCT 44.8  PLT 324  MCV 86.1  MCH 28.5  MCHC 33.1  RDW 17.4*  LYMPHSABS 0.4*  MONOABS 1.3*  EOSABS 0.0  BASOSABS 0.0   ------------------------------------------------------------------------------------------------------------------  Chemistries  Recent Labs  Lab 11/18/17 1731  NA 141  K 4.0  CL 103  CO2 23  GLUCOSE 116*  BUN 17  CREATININE 0.74  CALCIUM 8.8*   ------------------------------------------------------------------------------------------------------------------ estimated creatinine clearance is 59.7 mL/min (by C-G formula based on  SCr of 0.74 mg/dL). ------------------------------------------------------------------------------------------------------------------ No results for input(s): TSH, T4TOTAL, T3FREE, THYROIDAB in the last 72 hours.  Invalid input(s): FREET3   Coagulation profile Recent Labs  Lab 11/18/17 1729  INR 0.94   ------------------------------------------------------------------------------------------------------------------- No results for input(s): DDIMER in the last 72 hours. -------------------------------------------------------------------------------------------------------------------  Cardiac Enzymes Recent Labs  Lab 11/18/17 1731  TROPONINI 0.04*   ------------------------------------------------------------------------------------------------------------------ Invalid input(s): POCBNP  ---------------------------------------------------------------------------------------------------------------  Urinalysis    Component Value Date/Time   COLORURINE STRAW (A) 07/01/2017 1721   APPEARANCEUR CLEAR (A) 07/01/2017 1721   APPEARANCEUR Hazy 10/13/2011 2052   LABSPEC 1.010 07/01/2017 1721   LABSPEC 1.010 10/13/2011 2052   PHURINE 6.0 07/01/2017 1721   GLUCOSEU NEGATIVE 07/01/2017 1721   GLUCOSEU Negative 10/13/2011 2052   HGBUR MODERATE (A) 07/01/2017 1721   BILIRUBINUR NEGATIVE 07/01/2017 1721   BILIRUBINUR Negative 10/13/2011 2052   KETONESUR NEGATIVE 07/01/2017 1721   PROTEINUR NEGATIVE 07/01/2017 1721   NITRITE NEGATIVE 07/01/2017 1721   LEUKOCYTESUR NEGATIVE 07/01/2017 1721   LEUKOCYTESUR Negative 10/13/2011 2052     RADIOLOGY: Dg Chest Portable 1 View  Result Date: 11/18/2017 CLINICAL DATA:  Worsening shortness of breath for 4 days. History of COPD, smoker, LEFT upper lobectomy. EXAM: PORTABLE CHEST 1 VIEW COMPARISON:  Chest radiograph October 09, 2017 FINDINGS: Cardiomediastinal silhouette is normal, calcified aortic knob. Increased lung volumes with similar  biapical pleuroparenchymal scarring. LEFT upper lung surgical staple line. No pleural effusion. No pneumothorax. Soft tissue planes and included osseous structures are non suspicious IMPRESSION: Stable COPD with biapical pleuroparenchymal scarring. Postsurgical changes LEFT upper lobe. Aortic Atherosclerosis (ICD10-I70.0). Electronically Signed   By: Awilda Metro M.D.   On: 11/18/2017 17:49    EKG: Orders placed or performed during the hospital encounter of 11/18/17  . ED EKG  . ED EKG  . ED EKG  . ED EKG  . EKG 12-Lead  . EKG 12-Lead    IMPRESSION AND PLAN: 52 year old female patient with history of emphysema,boop, congestive heart failure, hypertension, hyperlipidemia presented to the emergency room with increased shortness of breath and wheezing.  Admitting diagnosis 1.  Acute respiratory failure with hypoxia  2.  Acute COPD exacerbation 3.  Hypertension 4.  Chronic congestive heart failure Treatment plan Admit patient to stepdown unit Continue BiPAP for respiratory failure IV Solu-Medrol 60 mg every 6 hourly  nebulization treatments IV Levaquin antibiotic 500 mg daily Intensivist consultation   All the records are reviewed and case discussed with ED provider. Management plans discussed with the patient, family and they are in agreement.  CODE STATUS:FULL CODE Code Status History    Date Active Date Inactive Code Status Order ID Comments User Context   07/01/2017 20:23 07/04/2017 16:48 Full Code [id number]  Altamese Dilling, MD ED   07/19/2016 11:48 07/20/2016 20:10 Full Code  [id number]  Adrian Saran, MD Inpatient   05/09/2016 14:40 05/13/2016 14:37 Full Code [id number]  Merwyn Katos, MD ED   02/05/2016 19:14 02/08/2016 14:52 Full Code [id number]  Ramonita Lab, MD Inpatient   12/05/2015 23:02 12/09/2015 15:35 Full Code [id number]  Oralia Manis, MD Inpatient    Advance Directive Documentation     Most Recent Value  Type of Advance Directive  Living will  Pre-existing  out of facility DNR order (yellow form or pink MOST form)  No data  "MOST" Form in Place?  No data       TOTAL CRITICAL CARE TIME TAKING CARE OF THIS PATIENT: 52 minutes.    Ihor Austin M.D on 11/18/2017 at 7:42 PM  Between 7am to 6pm - Pager - [phone]  After 6pm go to [email or web address] - password [email or web address] [facility]  [facility]  Office  [phone]  CC: Primary care physician; Corky Downs, MD

## 2017-11-19 ENCOUNTER — Inpatient Hospital Stay: Payer: Medicare Other

## 2017-11-19 DIAGNOSIS — J9621 Acute and chronic respiratory failure with hypoxia: Secondary | ICD-10-CM

## 2017-11-19 LAB — GLUCOSE, CAPILLARY
GLUCOSE-CAPILLARY: 152 mg/dL — AB (ref 65–99)
GLUCOSE-CAPILLARY: 111 mg/dL — AB (ref 65–99)
Glucose-Capillary: 117 mg/dL — ABNORMAL HIGH (ref 65–99)
Glucose-Capillary: 157 mg/dL — ABNORMAL HIGH (ref 65–99)

## 2017-11-19 LAB — CBC WITH DIFFERENTIAL/PLATELET
BASOS ABS: 0 10*3/uL (ref 0–0.1)
Basophils Relative: 0 %
EOS ABS: 0 10*3/uL (ref 0–0.7)
Eosinophils Relative: 0 %
HCT: 41.1 % (ref 35.0–47.0)
HEMOGLOBIN: 13 g/dL (ref 12.0–16.0)
LYMPHS ABS: 0.1 10*3/uL — AB (ref 1.0–3.6)
LYMPHS PCT: 1 %
MCH: 28 pg (ref 26.0–34.0)
MCHC: 31.6 g/dL — ABNORMAL LOW (ref 32.0–36.0)
MCV: 88.7 fL (ref 80.0–100.0)
Monocytes Absolute: 0.2 10*3/uL (ref 0.2–0.9)
Monocytes Relative: 2 %
NEUTROS PCT: 97 %
Neutro Abs: 10.3 10*3/uL — ABNORMAL HIGH (ref 1.4–6.5)
PLATELETS: 290 10*3/uL (ref 150–440)
RBC: 4.64 MIL/uL (ref 3.80–5.20)
RDW: 17.7 % — ABNORMAL HIGH (ref 11.5–14.5)
WBC: 10.7 10*3/uL (ref 3.6–11.0)

## 2017-11-19 LAB — BASIC METABOLIC PANEL
Anion gap: 9 (ref 5–15)
BUN: 23 mg/dL — ABNORMAL HIGH (ref 6–20)
CHLORIDE: 103 mmol/L (ref 101–111)
CO2: 25 mmol/L (ref 22–32)
CREATININE: 0.81 mg/dL (ref 0.44–1.00)
Calcium: 8.2 mg/dL — ABNORMAL LOW (ref 8.9–10.3)
Glucose, Bld: 147 mg/dL — ABNORMAL HIGH (ref 65–99)
POTASSIUM: 4 mmol/L (ref 3.5–5.1)
SODIUM: 137 mmol/L (ref 135–145)

## 2017-11-19 LAB — PHOSPHORUS
PHOSPHORUS: 3.7 mg/dL (ref 2.5–4.6)
Phosphorus: 5.4 mg/dL — ABNORMAL HIGH (ref 2.5–4.6)

## 2017-11-19 LAB — TRIGLYCERIDES: Triglycerides: 97 mg/dL (ref <150)

## 2017-11-19 LAB — PROCALCITONIN: Procalcitonin: <0.1 ng/mL

## 2017-11-19 LAB — TROPONIN I
TROPONIN I: 0.03 ng/mL — AB (ref <0.03)
Troponin I: 0.03 ng/mL (ref <0.03)

## 2017-11-19 LAB — MAGNESIUM
MAGNESIUM: 2.5 mg/dL — AB (ref 1.7–2.4)
Magnesium: 2.6 mg/dL — ABNORMAL HIGH (ref 1.7–2.4)

## 2017-11-19 MED ORDER — PANTOPRAZOLE SODIUM 40 MG IV SOLR
40.0000 mg | Freq: Two times a day (BID) | INTRAVENOUS | Status: DC
  Administered 2017-11-19 – 2017-11-23 (×10): 40 mg via INTRAVENOUS
  Filled 2017-11-19 (×10): qty 40

## 2017-11-19 MED ORDER — ROCURONIUM BROMIDE 50 MG/5ML IV SOLN
50.0000 mg | Freq: Once | INTRAVENOUS | Status: AC
  Administered 2017-11-19: 50 mg via INTRAVENOUS

## 2017-11-19 MED ORDER — STERILE WATER FOR INJECTION IJ SOLN
INTRAMUSCULAR | Status: AC
  Administered 2017-11-19: 10 mL
  Filled 2017-11-19: qty 10

## 2017-11-19 MED ORDER — VITAL HIGH PROTEIN PO LIQD
1000.0000 mL | ORAL | Status: DC
Start: 2017-11-19 — End: 2017-11-22
  Administered 2017-11-19 – 2017-11-21 (×3): 1000 mL

## 2017-11-19 MED ORDER — OSELTAMIVIR PHOSPHATE 75 MG PO CAPS: 75.0000 mg | ORAL_CAPSULE | Freq: Two times a day (BID) | ORAL | Status: DC

## 2017-11-19 MED ORDER — SENNOSIDES 8.8 MG/5ML PO SYRP
5.0000 mL | ORAL_SOLUTION | Freq: Every evening | ORAL | Status: DC | PRN
  Filled 2017-11-19: qty 5

## 2017-11-19 MED ORDER — FENTANYL CITRATE (PF) 100 MCG/2ML IJ SOLN
INTRAMUSCULAR | Status: AC
  Filled 2017-11-19: qty 2

## 2017-11-19 MED ORDER — FENTANYL CITRATE (PF) 100 MCG/2ML IJ SOLN
50.0000 ug | Freq: Once | INTRAMUSCULAR | Status: AC
  Administered 2017-11-19: 50 ug via INTRAVENOUS

## 2017-11-19 MED ORDER — FENTANYL 2500MCG IN NS 250ML (10MCG/ML) PREMIX INFUSION
25.0000 ug/h | INTRAVENOUS | Status: DC
  Administered 2017-11-19: 350 ug/h via INTRAVENOUS
  Administered 2017-11-19: 400 ug/h via INTRAVENOUS
  Administered 2017-11-19: 200 ug/h via INTRAVENOUS
  Administered 2017-11-19: 375 ug/h via INTRAVENOUS
  Administered 2017-11-20: 400 ug/h via INTRAVENOUS
  Administered 2017-11-20: 375 ug/h via INTRAVENOUS
  Administered 2017-11-20 (×2): 400 ug/h via INTRAVENOUS
  Administered 2017-11-21: 50 ug/h via INTRAVENOUS
  Administered 2017-11-21 (×2): 400 ug/h via INTRAVENOUS
  Filled 2017-11-19 (×10): qty 250

## 2017-11-19 MED ORDER — MIDAZOLAM HCL 2 MG/2ML IJ SOLN
2.0000 mg | INTRAMUSCULAR | Status: DC | PRN
  Administered 2017-11-20: 2 mg via INTRAVENOUS
  Filled 2017-11-19 (×2): qty 2

## 2017-11-19 MED ORDER — FENTANYL 2500MCG IN NS 250ML (10MCG/ML) PREMIX INFUSION
INTRAVENOUS | Status: AC
  Filled 2017-11-19: qty 250

## 2017-11-19 MED ORDER — ADULT MULTIVITAMIN LIQUID CH
15.0000 mL | Freq: Every day | ORAL | Status: DC
  Administered 2017-11-19 – 2017-11-22 (×4): 15 mL
  Filled 2017-11-19 (×6): qty 15

## 2017-11-19 MED ORDER — PRO-STAT SUGAR FREE PO LIQD
30.0000 mL | Freq: Two times a day (BID) | ORAL | Status: DC
  Administered 2017-11-19 (×2): 30 mL

## 2017-11-19 MED ORDER — FENTANYL BOLUS VIA INFUSION
50.0000 ug | INTRAVENOUS | Status: DC | PRN
  Filled 2017-11-19: qty 50

## 2017-11-19 MED ORDER — OSELTAMIVIR PHOSPHATE 30 MG PO CAPS
30.0000 mg | ORAL_CAPSULE | Freq: Two times a day (BID) | ORAL | Status: AC
  Administered 2017-11-19 – 2017-11-23 (×10): 30 mg via ORAL
  Filled 2017-11-19 (×10): qty 1

## 2017-11-19 MED ORDER — MIDAZOLAM HCL 2 MG/2ML IJ SOLN
INTRAMUSCULAR | Status: AC
  Filled 2017-11-19: qty 2

## 2017-11-19 MED ORDER — ROSUVASTATIN CALCIUM 10 MG PO TABS
10.0000 mg | ORAL_TABLET | Freq: Every day | ORAL | Status: DC
  Administered 2017-11-19 – 2017-11-22 (×4): 10 mg
  Filled 2017-11-19 (×4): qty 1

## 2017-11-19 MED ORDER — MIDAZOLAM HCL 2 MG/2ML IJ SOLN
2.0000 mg | INTRAMUSCULAR | Status: DC | PRN
  Administered 2017-11-21 – 2017-11-22 (×2): 2 mg via INTRAVENOUS
  Filled 2017-11-19: qty 2

## 2017-11-19 MED ORDER — ETOMIDATE 2 MG/ML IV SOLN
INTRAVENOUS | Status: AC
  Filled 2017-11-19: qty 30

## 2017-11-19 MED ORDER — PROPOFOL 1000 MG/100ML IV EMUL
INTRAVENOUS | Status: AC
  Administered 2017-11-19: 40 ug/kg/min via INTRAVENOUS
  Filled 2017-11-19: qty 100

## 2017-11-19 MED ORDER — SODIUM CHLORIDE 0.9 % IV SOLN
0.0000 ug/min | INTRAVENOUS | Status: DC
  Administered 2017-11-19: 30 ug/min via INTRAVENOUS
  Administered 2017-11-19 – 2017-11-20 (×2): 20 ug/min via INTRAVENOUS
  Administered 2017-11-20: 15 ug/min via INTRAVENOUS
  Administered 2017-11-21: 18 ug/min via INTRAVENOUS
  Filled 2017-11-19 (×2): qty 10
  Filled 2017-11-19 (×2): qty 1
  Filled 2017-11-19 (×3): qty 10

## 2017-11-19 MED ORDER — PROPOFOL 1000 MG/100ML IV EMUL
5.0000 ug/kg/min | INTRAVENOUS | Status: DC
  Administered 2017-11-19: 40 ug/kg/min via INTRAVENOUS
  Administered 2017-11-19: 45 ug/kg/min via INTRAVENOUS
  Administered 2017-11-19: 50 ug/kg/min via INTRAVENOUS
  Administered 2017-11-20 (×2): 60 ug/kg/min via INTRAVENOUS
  Administered 2017-11-20: 50 ug/kg/min via INTRAVENOUS
  Administered 2017-11-20: 60 ug/kg/min via INTRAVENOUS
  Administered 2017-11-21 (×2): 75 ug/kg/min via INTRAVENOUS
  Administered 2017-11-21: 60 ug/kg/min via INTRAVENOUS
  Administered 2017-11-22: 7 ug/kg/min via INTRAVENOUS
  Filled 2017-11-19 (×14): qty 100

## 2017-11-19 MED ORDER — CHLORHEXIDINE GLUCONATE 0.12% ORAL RINSE (MEDLINE KIT)
15.0000 mL | Freq: Two times a day (BID) | OROMUCOSAL | Status: DC
  Administered 2017-11-19 – 2017-11-25 (×12): 15 mL via OROMUCOSAL

## 2017-11-19 MED ORDER — ALPRAZOLAM 0.5 MG PO TABS
0.5000 mg | ORAL_TABLET | Freq: Three times a day (TID) | ORAL | Status: DC
  Administered 2017-11-19 – 2017-11-22 (×10): 0.5 mg
  Filled 2017-11-19 (×10): qty 1

## 2017-11-19 MED ORDER — DONEPEZIL HCL 10 MG PO TABS
10.0000 mg | ORAL_TABLET | Freq: Every day | ORAL | Status: DC
  Administered 2017-11-19 – 2017-11-25 (×7): 10 mg
  Filled 2017-11-19 (×3): qty 2
  Filled 2017-11-19 (×2): qty 1
  Filled 2017-11-19 (×2): qty 2

## 2017-11-19 MED ORDER — ETOMIDATE 2 MG/ML IV SOLN
20.0000 mg | Freq: Once | INTRAVENOUS | Status: AC
  Administered 2017-11-19: 20 mg via INTRAVENOUS

## 2017-11-19 MED ORDER — VITAL HIGH PROTEIN PO LIQD
1000.0000 mL | ORAL | Status: DC
  Administered 2017-11-19: 1000 mL

## 2017-11-19 MED ORDER — HYDRALAZINE HCL 20 MG/ML IJ SOLN
10.0000 mg | INTRAMUSCULAR | Status: DC | PRN
  Administered 2017-11-19 – 2017-11-23 (×4): 10 mg via INTRAVENOUS
  Administered 2017-11-24: 20 mg via INTRAVENOUS
  Filled 2017-11-19 (×3): qty 1

## 2017-11-19 MED ORDER — ORAL CARE MOUTH RINSE
15.0000 mL | Freq: Four times a day (QID) | OROMUCOSAL | Status: DC
  Administered 2017-11-19 – 2017-11-22 (×14): 15 mL via OROMUCOSAL

## 2017-11-19 MED ORDER — FENTANYL CITRATE (PF) 100 MCG/2ML IJ SOLN: 100.0000 ug | INTRAMUSCULAR | Status: AC

## 2017-11-19 MED ORDER — ROCURONIUM BROMIDE 50 MG/5ML IV SOLN
INTRAVENOUS | Status: AC
  Filled 2017-11-19: qty 1

## 2017-11-19 MED ORDER — HYDRALAZINE HCL 20 MG/ML IJ SOLN
INTRAMUSCULAR | Status: AC
  Filled 2017-11-19: qty 1

## 2017-11-19 NOTE — Procedures (Signed)
Endotracheal Intubation Procedure Note  Indication for endotracheal intubation: respiratory failure. Airway Assessment: Mallampati Class: II (hard and soft palate, upper portion of tonsils anduvula visible). Sedation: etomidate and fentanyl. Paralytic: rocuronium. Lidocaine: no. Atropine: no. Equipment: glidescope utilized . Cricoid Pressure: no. Number of attempts: 1. ETT location confirmed by by auscultation and ETCO2 monitor.  Chest xray pending to verify ETT placement   Sonda Rumbleana Keyshla Tunison, AGNP  Pulmonary/Critical Care Pager [phone] (please enter 7 digits) PCCM Consult Pager [phone] (please enter 7 digits)

## 2017-11-19 NOTE — Progress Notes (Signed)
Initial Nutrition Assessment  DOCUMENTATION CODES:   Not applicable  INTERVENTION:  Provide Vital High Protein at 30 mL/hr (720 mL goal daily volume) via OGT. Provides 720 kcal, 63 grams of protein, 605 mL H2O daily. With current propofol rate provides 1108 kcal.  Provide liquid MVI daily per tube as goal TF rate does not meet 100% RDIs for vitamins/minerals.  Will discontinue Pro-Stat as it is not needed to meet calorie/protein needs at this time.  NUTRITION DIAGNOSIS:   Inadequate oral intake related to inability to eat as evidenced by NPO status.  GOAL:   Provide needs based on ASPEN/SCCM guidelines  MONITOR:   Vent status, Labs, Weight trends, TF tolerance, I & O's  REASON FOR ASSESSMENT:   Ventilator, Consult Enteral/tube feeding initiation and management  ASSESSMENT:   52 year old female with PMHx of migraines, hypercholesterolemia, emphysema, lung mass, anxiety, hx MI 2012, BOOP, asthma, HTN, CHF, hx pharyngoesophageal phase dysphagia in 2016 who is admitted with acute on chronic respiratory failure secondary to acute exacerbation of COPD and flu and was emergently intubated early AM of 3/6.   Patient intubated and sedated. No family members at bedside at time of RD assessment. Per chart she was 119.2 lbs on 10/22/2017 and has lost 5.7 lbs (4.8% body weight) over the past month, which is not quite significant for time frame. Also weights earlier on admission were closer to 119 lbs so will continue to monitor. Abdomen soft on NFPE.  Access: 16 Fr. OGT placed 3/6; terminates in mid-stomach per abdominal x-ray 3/6; 55 cm at corner of mouth  MAP: 60-89 mmHg  TF: pt was started on TF protocol with VHP at 40 mL/hr and Pro-Stat 30 mL BID early this morning; tolerating per RN  Patient is currently intubated on ventilator support MV: 6.7 L/min Temp (24hrs), Avg:96.9 F (36.1 C), Min:96.1 F (35.6 C), Max:97.7 F (36.5 C)  Propofol: 14.7 ml/hr (388 kcal  daily)  Medications reviewed and include: Xanax TID, Solu-Medrol 60 mg Q6hrs, Tamiflu, pantoprazole, fentanyl gtt, Levaquin, phenylephrine gtt at 30 mcg/min, propofol gtt.  Labs reviewed: CBG 111-152, BUN 23, Phosphorus 5.4, Magnesium 2.5.  I/O: 360 mL UOP yesterday evening  Patient does not meet criteria for malnutrition at this time.  Discussed with RN and on rounds.  NUTRITION - FOCUSED PHYSICAL EXAM:    Most Recent Value  Orbital Region  No depletion  Upper Arm Region  No depletion  Thoracic and Lumbar Region  No depletion  Buccal Region  Unable to assess  Temple Region  No depletion  Clavicle Bone Region  No depletion  Clavicle and Acromion Bone Region  No depletion  Scapular Bone Region  Unable to assess  Dorsal Hand  No depletion  Patellar Region  No depletion  Anterior Thigh Region  No depletion  Posterior Calf Region  No depletion  Edema (RD Assessment)  None  Hair  Reviewed  Eyes  Unable to assess  Mouth  Unable to assess  Skin  Reviewed  Nails  Reviewed     Diet Order:  Diet NPO time specified  EDUCATION NEEDS:   No education needs have been identified at this time  Skin:  Skin Assessment: Reviewed RN Assessment(ecchymosis to buttocks and legs)  Last BM:  11/18/2017  Height:   Ht Readings from Last 1 Encounters:  11/18/17 4\' 10"  (1.473 m)    Weight:   Wt Readings from Last 1 Encounters:  11/19/17 113 lb 8.6 oz (51.5 kg)    Ideal  Body Weight:  43.9 kg  BMI:  Body mass index is 23.73 kg/m.  Estimated Nutritional Needs:   Kcal:  1064 (PSU 2003b w/ MSJ 1013, Ve 6.7, Tmax 36.5)  Protein:  62-77 grams (1.2-1.5 grams/kg)  Fluid:  1.5-1.8 L/day (30-35 mL/kg)  Helane Rima, MS, RD, LDN Office: [phone] Pager: [phone] After Hours/Weekend Pager: [phone]

## 2017-11-19 NOTE — Progress Notes (Signed)
[employer] [facility] [place] at [facility]   PATIENT NAME: Caitlyn Evans    MR#:  [id number]  DATE OF BIRTH:  07/28/1966  SUBJECTIVE:  CHIEF COMPLAINT:   Chief Complaint  Patient presents with  . Respiratory Distress  required intubation early morning REVIEW OF SYSTEMS:  Review of Systems  Unable to perform ROS: Intubated    DRUG ALLERGIES:   Allergies  Allergen Reactions  . Penicillins Hives and Other (See Comments)    Has patient had a PCN reaction causing immediate rash, facial/tongue/throat swelling, SOB or lightheadedness with hypotension: No Has patient had a PCN reaction causing severe rash involving mucus membranes or skin necrosis: No Has patient had a PCN reaction that required hospitalization No Has patient had a PCN reaction occurring within the last 10 years: No If all of the above answers are "NO", then may proceed with Cephalosporin use. Other reaction(s): Other (See Comments) Has patient had a PCN reaction causing immediate rash, facial/tongue/throat swelling, SOB or lightheadedness with hypotension: No Has patient had a PCN reaction causing severe rash involving mucus membranes or skin necrosis: No Has patient had a PCN reaction that required hospitalization No Has patient had a PCN reaction occurring within the last 10 years: No If all of the above answers are "NO", then may proceed with Cephalosporin use.  . Codeine Hives, Nausea And Vomiting and Nausea Only   VITALS:  Blood pressure 111/62, pulse 69, temperature 98 F (36.7 C), temperature source Axillary, resp. rate 15, height 4\' 10"  (1.473 m), weight 51.5 kg (113 lb 8.6 oz), SpO2 99 %. PHYSICAL EXAMINATION:  Physical Exam  Constitutional: She is well-developed, well-nourished, and in no distress.  HENT:  Head: Normocephalic and atraumatic.  ETT in place  Eyes: Conjunctivae and EOM are normal. Pupils are equal, round, and reactive to light.  Neck: Normal range of motion. Neck supple. No  tracheal deviation present. No thyromegaly present.  Cardiovascular: Normal rate, regular rhythm and normal heart sounds.  Pulmonary/Chest: Effort normal and breath sounds normal. No respiratory distress. She has no wheezes. She exhibits no tenderness.  Abdominal: Soft. Bowel sounds are normal. She exhibits no distension. There is no tenderness.  Musculoskeletal: Normal range of motion.  Neurological: No cranial nerve deficit.  Sedated on vent  Skin: Skin is warm and dry. No rash noted.  Psychiatric:  Sedated on vent   LABORATORY PANEL:  Female CBC Recent Labs  Lab 11/19/17 0432  WBC 10.7  HGB 13.0  HCT 41.1  PLT 290   ------------------------------------------------------------------------------------------------------------------ Chemistries  Recent Labs  Lab 11/19/17 0432  NA 137  K 4.0  CL 103  CO2 25  GLUCOSE 147*  BUN 23*  CREATININE 0.81  CALCIUM 8.2*  MG 2.5*   RADIOLOGY:  Dg Abd 1 View  Result Date: 11/19/2017 CLINICAL DATA:  OG tube placement EXAM: ABDOMEN - 1 VIEW COMPARISON:  07/01/2017 FINDINGS: Esophageal tube tip projects over mid stomach, side-port over the gastric fundus. Visible gas pattern is nonobstructed. IMPRESSION: Esophageal tube tip positioned over the mid stomach. Electronically Signed   By: Jasmine PangKim  Fujinaga M.D.   On: 11/19/2017 02:31   Dg Chest Port 1 View  Result Date: 11/19/2017 CLINICAL DATA:  Post intubation EXAM: PORTABLE CHEST 1 VIEW COMPARISON:  11/18/2017, 10/09/2017 FINDINGS: Endotracheal tube tip is about 1.8 cm superior to the carina. Esophageal tube tip is below the diaphragm but not included. Hyperinflation with emphysematous changes. Biapical pleuroparenchymal scarring with postsurgical changes in the left upper lobe. Stable  cardiomediastinal silhouette with aortic atherosclerosis. No pneumothorax. IMPRESSION: Endotracheal tube tip about 1.8 cm superior to carina. Hyperinflation and biapical scarring and left upper lobe surgical changes.  Electronically Signed   By: Jasmine Pang M.D.   On: 11/19/2017 02:31   Dg Chest Portable 1 View  Result Date: 11/18/2017 CLINICAL DATA:  Worsening shortness of breath for 4 days. History of COPD, smoker, LEFT upper lobectomy. EXAM: PORTABLE CHEST 1 VIEW COMPARISON:  Chest radiograph October 09, 2017 FINDINGS: Cardiomediastinal silhouette is normal, calcified aortic knob. Increased lung volumes with similar biapical pleuroparenchymal scarring. LEFT upper lung surgical staple line. No pleural effusion. No pneumothorax. Soft tissue planes and included osseous structures are non suspicious IMPRESSION: Stable COPD with biapical pleuroparenchymal scarring. Postsurgical changes LEFT upper lobe. Aortic Atherosclerosis (ICD10-I70.0). Electronically Signed   By: Awilda Metro M.D.   On: 11/18/2017 17:49   ASSESSMENT AND PLAN:  52 year old female patient with history of emphysema,boop, congestive heart failure, hypertension, hyperlipidemia admitted with increased shortness of breath and wheezing.  1.  Acute respiratory failure with hypoxia - intubated on vent, further mgmt per PCCM 2.  Acute COPD exacerbation: steroids, nebs, Abx 3.  Hypertension: well controlled 4.  Chronic congestive heart failure: well compensated at this time       All the records are reviewed and case discussed with Care Management/Social Worker. Management plans discussed with the patient, family and they are in agreement.  CODE STATUS: Full Code  TOTAL TIME TAKING CARE OF THIS PATIENT: 15 minutes.   More than 50% of the time was spent in counseling/coordination of care: YES     Delfino Lovett M.D on 11/19/2017 at 5:13 PM  Between 7am to 6pm - Pager - [phone]  After 6pm go to [email or web address] - Social research officer, government  [facility] [place] [facility]  Office  [phone]  CC: Primary care physician; Corky Downs, MD  Note: This dictation was prepared with Dragon dictation along with smaller phrase  technology. Any transcriptional errors that result from this process are unintentional.

## 2017-11-19 NOTE — Care Management (Signed)
Presents from home with severe respiratory distress due to copd.  Admitted to icu stepdown due to need for bipap but has since required intubation.  She is positive for A flu.

## 2017-11-20 DIAGNOSIS — J441 Chronic obstructive pulmonary disease with (acute) exacerbation: Principal | ICD-10-CM

## 2017-11-20 DIAGNOSIS — J9601 Acute respiratory failure with hypoxia: Secondary | ICD-10-CM

## 2017-11-20 LAB — BLOOD GAS, ARTERIAL
Acid-Base Excess: 0 mmol/L (ref 0.0–2.0)
Acid-Base Excess: 6.9 mmol/L — ABNORMAL HIGH (ref 0.0–2.0)
BICARBONATE: 27.6 mmol/L (ref 20.0–28.0)
Bicarbonate: 35.8 mmol/L — ABNORMAL HIGH (ref 20.0–28.0)
FIO2: 100
FIO2: 0.35
LHR: 15 {breaths}/min
MECHVT: 450 mL
Mechanical Rate: 15
O2 SAT: 93.9 %
O2 Saturation: 100 %
PCO2 ART: 78 mmHg — AB (ref 32.0–48.0)
PEEP: 5 cmH2O
PEEP: 5 cmH2O
PH ART: 7.3 — AB (ref 7.350–7.450)
Patient temperature: 37
Patient temperature: 37
VT: 450 mL
pCO2 arterial: 56 mmHg — ABNORMAL HIGH (ref 32.0–48.0)
pH, Arterial: 7.27 — ABNORMAL LOW (ref 7.350–7.450)
pO2, Arterial: 525 mmHg — ABNORMAL HIGH (ref 83.0–108.0)
pO2, Arterial: 80 mmHg — ABNORMAL LOW (ref 83.0–108.0)

## 2017-11-20 LAB — BASIC METABOLIC PANEL
ANION GAP: 4 — AB (ref 5–15)
ANION GAP: 7 (ref 5–15)
BUN: 28 mg/dL — ABNORMAL HIGH (ref 6–20)
BUN: 22 mg/dL — ABNORMAL HIGH (ref 6–20)
CALCIUM: 8 mg/dL — AB (ref 8.9–10.3)
CHLORIDE: 101 mmol/L (ref 101–111)
CO2: 28 mmol/L (ref 22–32)
CO2: 31 mmol/L (ref 22–32)
CREATININE: 0.49 mg/dL (ref 0.44–1.00)
CREATININE: 0.51 mg/dL (ref 0.44–1.00)
Calcium: 8.2 mg/dL — ABNORMAL LOW (ref 8.9–10.3)
Chloride: 99 mmol/L — ABNORMAL LOW (ref 101–111)
GFR calc Af Amer: >60 mL/min (ref >60)
GFR calc non Af Amer: >60 mL/min (ref >60)
GFR calc non Af Amer: >60 mL/min (ref >60)
GLUCOSE: 141 mg/dL — AB (ref 65–99)
Glucose, Bld: 171 mg/dL — ABNORMAL HIGH (ref 65–99)
Potassium: 5 mmol/L (ref 3.5–5.1)
Potassium: 4.4 mmol/L (ref 3.5–5.1)
SODIUM: 139 mmol/L (ref 135–145)
Sodium: 131 mmol/L — ABNORMAL LOW (ref 135–145)

## 2017-11-20 LAB — BLOOD GAS, VENOUS
ACID-BASE DEFICIT: 0.5 mmol/L (ref 0.0–2.0)
BICARBONATE: 24.2 mmol/L (ref 20.0–28.0)
Delivery systems: POSITIVE
FIO2: 0.3
O2 SAT: 93.3 %
PCO2 VEN: 39 mmHg — AB (ref 44.0–60.0)
PO2 VEN: 68 mmHg — AB (ref 32.0–45.0)
Patient temperature: 37
pH, Ven: 7.4 (ref 7.250–7.430)

## 2017-11-20 LAB — PHOSPHORUS
Phosphorus: 2.9 mg/dL (ref 2.5–4.6)
Phosphorus: 2.5 mg/dL (ref 2.5–4.6)

## 2017-11-20 LAB — GLUCOSE, CAPILLARY
GLUCOSE-CAPILLARY: 144 mg/dL — AB (ref 65–99)
GLUCOSE-CAPILLARY: 167 mg/dL — AB (ref 65–99)
Glucose-Capillary: 158 mg/dL — ABNORMAL HIGH (ref 65–99)
Glucose-Capillary: 173 mg/dL — ABNORMAL HIGH (ref 65–99)
Glucose-Capillary: 134 mg/dL — ABNORMAL HIGH (ref 65–99)
Glucose-Capillary: 154 mg/dL — ABNORMAL HIGH (ref 65–99)

## 2017-11-20 LAB — MAGNESIUM
MAGNESIUM: 2.4 mg/dL (ref 1.7–2.4)
Magnesium: 2.1 mg/dL (ref 1.7–2.4)

## 2017-11-20 LAB — PROCALCITONIN

## 2017-11-20 MED ORDER — SENNOSIDES 8.8 MG/5ML PO SYRP
5.0000 mL | ORAL_SOLUTION | Freq: Two times a day (BID) | ORAL | Status: DC
  Administered 2017-11-20 – 2017-11-22 (×6): 5 mL via ORAL
  Filled 2017-11-20 (×7): qty 5

## 2017-11-20 MED ORDER — DOCUSATE SODIUM 50 MG/5ML PO LIQD
100.0000 mg | Freq: Two times a day (BID) | ORAL | Status: DC
  Administered 2017-11-20 – 2017-11-22 (×4): 100 mg
  Filled 2017-11-20 (×4): qty 10

## 2017-11-20 NOTE — Progress Notes (Signed)
EKG changes noted and E-Link MD, Dr. Warrick Parisiangan, notified. New orders received for EKG 12 lead and STAT labs. Will notify MD when results are complete.

## 2017-11-20 NOTE — Consult Note (Signed)
   Name: Caitlyn Evans MRN: [id number] DOB: 04-13-1966    ADMISSION DATE:  11/18/2017 CONSULTATION DATE: 11/18/2017  REFERRING MD : Dr. Tobi BastosPyreddy   CHIEF COMPLAINT: Shortness of Breath   BRIEF PATIENT DESCRIPTION: 52 yo female admitted with acute on chronic respiratory failure secondary to AECOPD requiring Bipap   SIGNIFICANT EVENTS  03/5-Pt admitted to stepdown unit  03/07 remains on vent, COPD exacerbation  STUDIES:  None   HISTORY OF PRESENT ILLNESS:   Patient remains intubated,sedated On full vent support Still wheezing Critically ill  REVIEW OF SYSTEMS:   Unable to assess pt in respiratory distress on Bipap     VITAL SIGNS: Temp:  [97.9 F (36.6 C)-98.3 F (36.8 C)] 98.3 F (36.8 C) (03/07 0700) Pulse Rate:  [64-106] 87 (03/07 0800) Resp:  [14-23] 15 (03/07 0800) BP: (101-122)/(51-65) 103/51 (03/07 0800) SpO2:  [93 %-100 %] 95 % (03/07 0800) FiO2 (%):  [35 %-100 %] 95 % (03/07 0343)  PHYSICAL EXAMINATION: General: acutely ill appearing female, intubated,sedated Neuro: alert and oriented, follows commands  HEENT: supple, no JVD  Cardiovascular: nsr, s1s2, no M/R/G  Lungs: diffuse wheezes throughout, slightly labored and tachypneic  Abdomen: +BS x4, soft, non tender, non distended  Musculoskeletal: normal bulk and tone, no edema  Skin: intact no rashes or lesions gcs<8T  Recent Labs  Lab 11/18/17 1731 11/19/17 0432 11/20/17 0545  NA 141 137 131*  K 4.0 4.0 5.0  CL 103 103 99*  CO2 23 25 28   BUN 17 23* 28*  CREATININE 0.74 0.81 0.49  GLUCOSE 116* 147* 141*   Recent Labs  Lab 11/18/17 1731 11/19/17 0432  HGB 14.8 13.0  HCT 44.8 41.1  WBC 17.2* 10.7  PLT 324 290    ASSESSMENT / PLAN: 52 yo admitted to ICU for severe COPD exacerbation with severe resp failure from viral pneumonia faikure to wean from vent due to severe COPD   Acute on chronic respiratory failure secondary to AECOPD  Mildly elevated troponin's likely secondary to demand  ischemia in setting of respiratory failure  Current everyday smoker Hx: MI, HTN, Lung Mass, Dysphagia, Asthma, BOOP, and Anxiety P: Vent support-wean as tolerated Continue scheduled and prn bronchodilator therapy  Nebulized and iv steroids  Continuous telemetry monitoring  Follow cultures Influenza  A Continue abx Trend BMP  Replace electrolytes as indicated  Monitor UOP  Subq lovenox for VTE prophylaxis  Trend CBC Monitor for s/sx of bleeding and transfuse for hgb <7 Prn ativan and scheduled xanax for anxiety  RASS goal -1  Unable to wean from vent due to severe COPD    Critical Care Time devoted to patient care services described in this note is 32 minutes.   Overall, patient is critically ill, prognosis is guarded.  Patient with Multiorgan failure and at high risk for cardiac arrest and death.    Lucie LeatherKurian David Jaycee Mckellips, M.D.  [facility]  Medical Director [facility] Medical Director [facility]

## 2017-11-20 NOTE — Progress Notes (Signed)
E-Link MD notified of results as per his request. No further orders received.

## 2017-11-20 NOTE — Progress Notes (Signed)
   11/20/17 1344  Clinical Encounter Type  Visited With Patient not available  Visit Type Initial  Spiritual Encounters  Spiritual Needs Prayer   While rounding, chaplain stopped to offer silent prayer for patient.  Family/friends not present to engage.

## 2017-11-20 NOTE — Progress Notes (Signed)
[employer] - [place] at [facility]   PATIENT NAME: Caitlyn Evans    MR#:  [id number]  DATE OF BIRTH:  May 22, 1966  SUBJECTIVE:  CHIEF COMPLAINT:   Chief Complaint  Patient presents with  . Respiratory Distress   Patient on full ventilatory support and sedated.  Admitted for COPD and influenza.  Intubated on 11/19/2017.  REVIEW OF SYSTEMS:  Review of Systems  Unable to perform ROS: Intubated    DRUG ALLERGIES:   Allergies  Allergen Reactions  . Penicillins Hives and Other (See Comments)    Has patient had a PCN reaction causing immediate rash, facial/tongue/throat swelling, SOB or lightheadedness with hypotension: No Has patient had a PCN reaction causing severe rash involving mucus membranes or skin necrosis: No Has patient had a PCN reaction that required hospitalization No Has patient had a PCN reaction occurring within the last 10 years: No If all of the above answers are "NO", then may proceed with Cephalosporin use. Other reaction(s): Other (See Comments) Has patient had a PCN reaction causing immediate rash, facial/tongue/throat swelling, SOB or lightheadedness with hypotension: No Has patient had a PCN reaction causing severe rash involving mucus membranes or skin necrosis: No Has patient had a PCN reaction that required hospitalization No Has patient had a PCN reaction occurring within the last 10 years: No If all of the above answers are "NO", then may proceed with Cephalosporin use.  . Codeine Hives, Nausea And Vomiting and Nausea Only   VITALS:  Blood pressure (!) 95/54, pulse 84, temperature 98.3 F (36.8 C), temperature source Axillary, resp. rate 15, height 4\' 10"  (1.473 m), weight 51.5 kg (113 lb 8.6 oz), SpO2 95 %. PHYSICAL EXAMINATION:  Physical Exam  Constitutional: She is well-developed, well-nourished, and in no distress.  HENT:  Head: Normocephalic and atraumatic.  ETT in place  Eyes: Conjunctivae and EOM are normal. Pupils are  equal, round, and reactive to light.  Neck: Normal range of motion. Neck supple. No tracheal deviation present. No thyromegaly present.  Cardiovascular: Normal rate, regular rhythm and normal heart sounds.  Pulmonary/Chest: No respiratory distress. She has wheezes. She exhibits no tenderness.  Abdominal: Soft. Bowel sounds are normal. She exhibits no distension. There is no tenderness.  Musculoskeletal: Normal range of motion.  Neurological: No cranial nerve deficit.  Sedated on vent  Skin: Skin is warm and dry. No rash noted.  Psychiatric:  Sedated on vent   LABORATORY PANEL:  Female CBC Recent Labs  Lab 11/19/17 0432  WBC 10.7  HGB 13.0  HCT 41.1  PLT 290   ------------------------------------------------------------------------------------------------------------------ Chemistries  Recent Labs  Lab 11/20/17 0545  NA 131*  K 5.0  CL 99*  CO2 28  GLUCOSE 141*  BUN 28*  CREATININE 0.49  CALCIUM 8.0*  MG 2.4   RADIOLOGY:  No results found. ASSESSMENT AND PLAN:  52 year old female patient with history of emphysema,boop, congestive heart failure, hypertension, hyperlipidemia admitted with increased shortness of breath and wheezing.  *Acute hypoxic respiratory failure secondary to COPD exacerbation and influenza A -IV steroids, Antibiotics - Scheduled Nebulizers - Inhalers -Continue full ventilatory support -Tamiflu -Appreciate pulmonary help  *Hypertension.  Well controlled  *Chronic diastolic congestive heart failure Well compensated.  All the records are reviewed and case discussed with Care Management/Social Worker. Management plans discussed with the patient, family and they are in agreement.  CODE STATUS: Full Code  TOTAL TIME TAKING CARE OF THIS PATIENT: 25 minutes.   Orie Fisherman M.D on 11/20/2017 at  11:01 AM  Between 7am to 6pm - Pager - [phone]  After 6pm go to [email or web address] - Social research officer, governmentpassword [facility]AS [facility]  [facility] [place] [facility]    Office  [phone]  CC: Primary care physician; Corky DownsMasoud, Javed, MD  Note: This dictation was prepared with Dragon dictation along with smaller phrase technology. Any transcriptional errors that result from this process are unintentional.

## 2017-11-21 LAB — GLUCOSE, CAPILLARY
Glucose-Capillary: 112 mg/dL — ABNORMAL HIGH (ref 65–99)
Glucose-Capillary: 126 mg/dL — ABNORMAL HIGH (ref 65–99)
Glucose-Capillary: 132 mg/dL — ABNORMAL HIGH (ref 65–99)
Glucose-Capillary: 155 mg/dL — ABNORMAL HIGH (ref 65–99)
Glucose-Capillary: 157 mg/dL — ABNORMAL HIGH (ref 65–99)
Glucose-Capillary: 169 mg/dL — ABNORMAL HIGH (ref 65–99)

## 2017-11-21 MED ORDER — INSULIN ASPART 100 UNIT/ML ~~LOC~~ SOLN
0.0000 [IU] | SUBCUTANEOUS | Status: DC
  Administered 2017-11-21 (×2): 2 [IU] via SUBCUTANEOUS
  Administered 2017-11-22: 3 [IU] via SUBCUTANEOUS
  Administered 2017-11-22 (×3): 2 [IU] via SUBCUTANEOUS
  Filled 2017-11-21 (×6): qty 1

## 2017-11-21 MED ORDER — POTASSIUM CHLORIDE 10 MEQ/100ML IV SOLN
10.0000 meq | INTRAVENOUS | Status: DC
  Filled 2017-11-21 (×2): qty 100

## 2017-11-21 MED ORDER — DEXMEDETOMIDINE HCL IN NACL 400 MCG/100ML IV SOLN
0.4000 ug/kg/h | INTRAVENOUS | Status: DC
  Administered 2017-11-21: 0.8 ug/kg/h via INTRAVENOUS
  Administered 2017-11-22: 0.946 ug/kg/h via INTRAVENOUS
  Administered 2017-11-22: 1 ug/kg/h via INTRAVENOUS
  Filled 2017-11-21 (×5): qty 100

## 2017-11-21 NOTE — Progress Notes (Signed)
[facility] Medicine Progess Note    SYNOPSIS   52 yo female admitted with acute on chronic respiratory failure secondary to AECOPD initially required Bipap, then subsequently intubated on mechanical ventilation  ASSESSMENT/PLAN   Acute on chronic respiratory failure secondary to AECOPD. Patient is influenza A positive. Agree with albuterol, Atrovent, Solu-Medrol, Tamiflu. Was on Levaquin, has been DC'd. Will obtain arterial blood gas this morning if pH is above 7.3 ill perform spontaneous awakening and breathing trial after weaning sedationplus minus Precedex.  Mildly elevated troponin's likely secondary to demand ischemia in setting of respiratory failure   Hyperglycemia. Last glucose 171  Prerenal azotemia with BUN of 22 over creatinine 0.5.  Critical care time 40 minutes   VENTILATOR SETTINGS: Vent Mode: PRVC FiO2 (%):  [35 %] 35 % Set Rate:  [5 bmp-15 bmp] 5 bmp Vt Set:  [450 mL-500 mL] 500 mL PEEP:  [5 cmH20] 5 cmH20 Plateau Pressure:  [22 cmH20] 22 cmH20  INTAKE / OUTPUT:  Intake/Output Summary (Last 24 hours) at 11/21/2017 0858 Last data filed at 11/21/2017 0700 Gross per 24 hour  Intake 2891.85 ml  Output 1800 ml  Net 1091.85 ml    Name: Caitlyn Evans MRN: [id number] DOB: June 06, 1966    ADMISSION DATE:  11/18/2017  SUBJECTIVE:   Pt currently on the ventilator, can not provide history or review of systems.   VITAL SIGNS: Temp:  [97.7 F (36.5 C)-98.2 F (36.8 C)] 98 F (36.7 C) (03/08 0700) Pulse Rate:  [67-104] 104 (03/08 0800) Resp:  [14-17] 17 (03/08 0800) BP: (95-124)/(47-67) 101/67 (03/08 0800) SpO2:  [93 %-98 %] 95 % (03/08 0809) FiO2 (%):  [35 %] 35 % (03/08 0809) Weight:  [57.5 kg (126 lb 12.2 oz)] 57.5 kg (126 lb 12.2 oz) (03/08 0500)  PHYSICAL EXAMINATION: Physical Examination:   VS: BP 101/67   Pulse (!) 104   Temp 98 F (36.7 C) (Axillary)   Resp 17   Ht 4\' 10"  (1.473 m)   Wt 57.5 kg (126 lb 12.2 oz)   SpO2 95%   BMI  26.49 kg/m   General Appearance: No distress sedated on propofol and fentanyl Neuro:without focal findings, Limited exam secondary to sedation HEENT: patient orally intubated, no evidence of stridor or accessory muscle utilization Pulmonary: prolonged expiratory phase with diminished breath sounds appreciated   CardiovascularNormal S1,S2.  No m/r/g.   Abdomen: Benign, Soft, non-tender. Skin:   warm, no rashes, no ecchymosis  Extremities: normal, no cyanosis, clubbing.  LABORATORY PANEL:   CBC Recent Labs  Lab 11/19/17 0432  WBC 10.7  HGB 13.0  HCT 41.1  PLT 290    Chemistries  Recent Labs  Lab 11/20/17 1733 11/20/17 2239  NA  --  139  K  --  4.4  CL  --  101  CO2  --  31  GLUCOSE  --  171*  BUN  --  22*  CREATININE  --  0.51  CALCIUM  --  8.2*  MG 2.1  --   PHOS 2.5  --     Recent Labs  Lab 11/20/17 1132 11/20/17 1815 11/20/17 2031 11/21/17 0004 11/21/17 0330 11/21/17 0745  GLUCAP 134* 167* 154* 155* 157* 169*   Recent Labs  Lab 11/19/17 0316 11/20/17 1144  PHART 7.30* 7.27*  PCO2ART 56* 78*  PO2ART 525* 80*   No results for input(s): AST, ALT, ALKPHOS, BILITOT, ALBUMIN in the last 168 hours.  Cardiac Enzymes Recent Labs  Lab 11/19/17 [phone]  TROPONINI 0.03*  0.03*    RADIOLOGY:  No results found.   Tora Kindred, DO  11/21/2017

## 2017-11-21 NOTE — Progress Notes (Addendum)
[facility] - [place] at [facility]   PATIENT NAME: Caitlyn Evans    MR#:  [id number]  DATE OF BIRTH:  1966/09/02  SUBJECTIVE:  CHIEF COMPLAINT:   Chief Complaint  Patient presents with  . Respiratory Distress   Patient on full ventilatory support and on awakening trail.  Admitted for COPD and influenza.  Intubated on 11/19/2017.  REVIEW OF SYSTEMS:  Review of Systems  Unable to perform ROS: Intubated    DRUG ALLERGIES:   Allergies  Allergen Reactions  . Penicillins Hives and Other (See Comments)    Has patient had a PCN reaction causing immediate rash, facial/tongue/throat swelling, SOB or lightheadedness with hypotension: No Has patient had a PCN reaction causing severe rash involving mucus membranes or skin necrosis: No Has patient had a PCN reaction that required hospitalization No Has patient had a PCN reaction occurring within the last 10 years: No If all of the above answers are "NO", then may proceed with Cephalosporin use. Other reaction(s): Other (See Comments) Has patient had a PCN reaction causing immediate rash, facial/tongue/throat swelling, SOB or lightheadedness with hypotension: No Has patient had a PCN reaction causing severe rash involving mucus membranes or skin necrosis: No Has patient had a PCN reaction that required hospitalization No Has patient had a PCN reaction occurring within the last 10 years: No If all of the above answers are "NO", then may proceed with Cephalosporin use.  . Codeine Hives, Nausea And Vomiting and Nausea Only   VITALS:  Blood pressure 127/69, pulse 82, temperature 97.7 F (36.5 C), temperature source Axillary, resp. rate 14, height 4\' 10"  (1.473 m), weight 57.5 kg (126 lb 12.2 oz), SpO2 97 %. PHYSICAL EXAMINATION:  Physical Exam  Constitutional: She is well-developed, well-nourished, and in no distress.  HENT:  Head: Normocephalic and atraumatic.  ETT in place  Eyes: Conjunctivae and EOM are normal.  Pupils are equal, round, and reactive to light.  Neck: Normal range of motion. Neck supple. No tracheal deviation present. No thyromegaly present.  Cardiovascular: Normal rate, regular rhythm and normal heart sounds.  Pulmonary/Chest: No respiratory distress. She has wheezes. She exhibits no tenderness.  Abdominal: Soft. Bowel sounds are normal. She exhibits no distension. There is no tenderness.  Musculoskeletal: Normal range of motion.  Neurological: No cranial nerve deficit.  Sedated on vent  Skin: Skin is warm and dry. No rash noted.  Psychiatric:  Sedated on vent   LABORATORY PANEL:  Female CBC Recent Labs  Lab 11/19/17 0432  WBC 10.7  HGB 13.0  HCT 41.1  PLT 290   ------------------------------------------------------------------------------------------------------------------ Chemistries  Recent Labs  Lab 11/20/17 1733 11/20/17 2239  NA  --  139  K  --  4.4  CL  --  101  CO2  --  31  GLUCOSE  --  171*  BUN  --  22*  CREATININE  --  0.51  CALCIUM  --  8.2*  MG 2.1  --    RADIOLOGY:  No results found. ASSESSMENT AND PLAN:  52 year old female patient with history of emphysema,boop, congestive heart failure, hypertension, hyperlipidemia admitted with increased shortness of breath and wheezing.  *Acute hypoxic respiratory failure secondary to COPD exacerbation and influenza A -IV steroids,off Antibiotics - Scheduled Nebulizers - Inhalers -weaning trials -Tamiflu -Appreciate pulmonary and critical care support  *Hypertension.  Well controlled  *Chronic diastolic congestive heart failure Well compensated.  All the records are reviewed and case discussed with Care Management/Social Worker. Management plans discussed with the  patient, family and they are in agreement.  CODE STATUS: Full Code  TOTAL TIME TAKING CARE OF THIS PATIENT: 31 minutes.   Ihor Austin M.D on 11/21/2017 at 1:23 PM  Between 7am to 6pm - Pager - [phone]  After 6pm go to  [email or web address] - Social research officer, government  [facility] [place] [facility]  Office  [phone]  CC: Primary care physician; Corky Downs, MD  Note: This dictation was prepared with Dragon dictation along with smaller phrase technology. Any transcriptional errors that result from this process are unintentional.

## 2017-11-22 ENCOUNTER — Inpatient Hospital Stay: Payer: Medicare Other

## 2017-11-22 LAB — BASIC METABOLIC PANEL
Anion gap: 6 (ref 5–15)
BUN: 25 mg/dL — AB (ref 6–20)
CHLORIDE: 100 mmol/L — AB (ref 101–111)
CO2: 34 mmol/L — AB (ref 22–32)
Calcium: 8.3 mg/dL — ABNORMAL LOW (ref 8.9–10.3)
Creatinine, Ser: 0.42 mg/dL — ABNORMAL LOW (ref 0.44–1.00)
GFR calc non Af Amer: >60 mL/min (ref >60)
Glucose, Bld: 130 mg/dL — ABNORMAL HIGH (ref 65–99)
Potassium: 4.3 mmol/L (ref 3.5–5.1)
Sodium: 140 mmol/L (ref 135–145)

## 2017-11-22 LAB — BLOOD GAS, ARTERIAL
ACID-BASE EXCESS: 11.5 mmol/L — AB (ref 0.0–2.0)
Bicarbonate: 39.3 mmol/L — ABNORMAL HIGH (ref 20.0–28.0)
FIO2: 0.35
LHR: 15 {breaths}/min
O2 Saturation: 95.4 %
PEEP: 5 cmH2O
PH ART: 7.37 (ref 7.350–7.450)
PO2 ART: 80 mmHg — AB (ref 83.0–108.0)
Patient temperature: 37
VT: 500 mL

## 2017-11-22 LAB — GLUCOSE, CAPILLARY
GLUCOSE-CAPILLARY: 176 mg/dL — AB (ref 65–99)
Glucose-Capillary: 127 mg/dL — ABNORMAL HIGH (ref 65–99)
Glucose-Capillary: 140 mg/dL — ABNORMAL HIGH (ref 65–99)
Glucose-Capillary: 144 mg/dL — ABNORMAL HIGH (ref 65–99)
Glucose-Capillary: 147 mg/dL — ABNORMAL HIGH (ref 65–99)

## 2017-11-22 LAB — MAGNESIUM: Magnesium: 2.2 mg/dL (ref 1.7–2.4)

## 2017-11-22 LAB — PHOSPHORUS: PHOSPHORUS: 2.8 mg/dL (ref 2.5–4.6)

## 2017-11-22 LAB — TRIGLYCERIDES: Triglycerides: 103 mg/dL (ref <150)

## 2017-11-22 MED ORDER — PROMETHAZINE HCL 25 MG/ML IJ SOLN
25.0000 mg | Freq: Once | INTRAMUSCULAR | Status: AC
  Administered 2017-11-22: 25 mg via INTRAVENOUS
  Filled 2017-11-22: qty 1

## 2017-11-22 MED ORDER — ADULT MULTIVITAMIN LIQUID CH
15.0000 mL | Freq: Every day | ORAL | Status: DC
  Filled 2017-11-22: qty 15

## 2017-11-22 MED ORDER — ALPRAZOLAM 0.5 MG PO TABS
0.5000 mg | ORAL_TABLET | Freq: Three times a day (TID) | ORAL | Status: DC
  Administered 2017-11-22 – 2017-11-25 (×9): 0.5 mg via ORAL
  Filled 2017-11-22 (×9): qty 1

## 2017-11-22 MED ORDER — DOCUSATE SODIUM 50 MG/5ML PO LIQD
100.0000 mg | Freq: Two times a day (BID) | ORAL | Status: DC
  Administered 2017-11-22: 100 mg via ORAL
  Filled 2017-11-22 (×2): qty 10

## 2017-11-22 MED ORDER — ROSUVASTATIN CALCIUM 10 MG PO TABS
10.0000 mg | ORAL_TABLET | Freq: Every day | ORAL | Status: DC
  Administered 2017-11-23 – 2017-11-25 (×3): 10 mg via ORAL
  Filled 2017-11-22 (×3): qty 1

## 2017-11-22 MED ORDER — PHENOL 1.4 % MT LIQD
1.0000 | OROMUCOSAL | Status: DC | PRN
  Filled 2017-11-22: qty 177

## 2017-11-22 NOTE — Progress Notes (Signed)
[facility] - [place] at [facility]   PATIENT NAME: Caitlyn Evans    MR#:  [id number]  DATE OF BIRTH:  1965/10/20  SUBJECTIVE:  CHIEF COMPLAINT:   Chief Complaint  Patient presents with  . Respiratory Distress   -Remains intubated, for COPD exacerbation. On sedation but still able to open eyes and follow commands.  REVIEW OF SYSTEMS:  Review of Systems  Unable to perform ROS: Critical illness    DRUG ALLERGIES:   Allergies  Allergen Reactions  . Penicillins Hives and Other (See Comments)    Has patient had a PCN reaction causing immediate rash, facial/tongue/throat swelling, SOB or lightheadedness with hypotension: No Has patient had a PCN reaction causing severe rash involving mucus membranes or skin necrosis: No Has patient had a PCN reaction that required hospitalization No Has patient had a PCN reaction occurring within the last 10 years: No If all of the above answers are "NO", then may proceed with Cephalosporin use. Other reaction(s): Other (See Comments) Has patient had a PCN reaction causing immediate rash, facial/tongue/throat swelling, SOB or lightheadedness with hypotension: No Has patient had a PCN reaction causing severe rash involving mucus membranes or skin necrosis: No Has patient had a PCN reaction that required hospitalization No Has patient had a PCN reaction occurring within the last 10 years: No If all of the above answers are "NO", then may proceed with Cephalosporin use.  . Codeine Hives, Nausea And Vomiting and Nausea Only    VITALS:  Blood pressure 123/74, pulse 84, temperature 99.6 F (37.6 C), temperature source Oral, resp. rate 15, height 4\' 10"  (1.473 m), weight 54.1 kg (119 lb 4.3 oz), SpO2 94 %.  PHYSICAL EXAMINATION:  Physical Exam  GENERAL:  52 y.o.-year-old patient lying in the bed, ill appearing. EYES: Pupils equal, round, reactive to light and accommodation. No scleral icterus. Extraocular muscles intact.  HEENT:  Head atraumatic, normocephalic. Oropharynx and nasopharynx clear.  NECK:  Supple, no jugular venous distention. No thyroid enlargement, no tenderness.  LUNGS: Normal breath sounds bilaterally, no wheezing, rales,rhonchi or crepitation. No use of accessory muscles of respiration.  CARDIOVASCULAR: S1, S2 normal. No murmurs, rubs, or gallops.  ABDOMEN: Soft, nontender, nondistended. Bowel sounds present. No organomegaly or mass.  EXTREMITIES: No pedal edema, cyanosis, or clubbing.  NEUROLOGIC: On sedation, cranial nerves intact. Following commands. Nodding yes or no to questions. Sensation is intact PSYCHIATRIC: The patient is on sedation but following commands  SKIN: No obvious rash, lesion, or ulcer.    LABORATORY PANEL:   CBC Recent Labs  Lab 11/19/17 0432  WBC 10.7  HGB 13.0  HCT 41.1  PLT 290   ------------------------------------------------------------------------------------------------------------------  Chemistries  Recent Labs  Lab 11/22/17 0319  NA 140  K 4.3  CL 100*  CO2 34*  GLUCOSE 130*  BUN 25*  CREATININE 0.42*  CALCIUM 8.3*  MG 2.2   ------------------------------------------------------------------------------------------------------------------  Cardiac Enzymes Recent Labs  Lab 11/19/17 0432  TROPONINI 0.03*  0.03*   ------------------------------------------------------------------------------------------------------------------  RADIOLOGY:  Dg Chest Port 1 View  Result Date: 11/22/2017 CLINICAL DATA:  Acute respiratory failure EXAM: PORTABLE CHEST 1 VIEW COMPARISON:  November 19, 2017 FINDINGS: The ETT is in good position. The NG tube terminates below today's film. Scarring in the apices, right greater than left, persists. Postsurgical changes in the left apex. The heart, hila, mediastinum, lungs, and pleura are otherwise normal. IMPRESSION: No active disease. Electronically Signed   By: Gerome Sam III M.D   On: 11/22/2017 07:46  EKG:    Orders placed or performed during the hospital encounter of 11/18/17  . ED EKG  . ED EKG  . ED EKG  . ED EKG  . EKG 12-Lead  . EKG 12-Lead  . EKG 12-Lead  . EKG 12-Lead    ASSESSMENT AND PLAN:   52 year old female with past medical history significant for COPD, ongoing smoking, diastolic CHF, history of BOOP and hypertension presents for hospital secondary to worsening shortness of breath and wheezing  1. Acute hypoxic respiratory failure-secondary to COPD exacerbation and also influenza illness. -Remains intubated, ventilated with minimal vent settings.  -likely is plan for spontaneous breathing trial and extubation today. -Continue nebulizers, steroids -Appreciate pulmonary consult. -On Tamiflu. Off antibiotics  2. Hypertension-on when necessary hydralazine for now.  3. Chronic diastolic congestive heart failure-well compensated. Not on diuretics at this time. Monitor  4. Hyperlipidemia-on Crestor  5. DVT prophylaxis-on Lovenox   All the records are reviewed and case discussed with Care Management/Social Workerr. Management plans discussed with the patient, family and they are in agreement.  CODE STATUS: Full code  TOTAL TIME TAKING CARE OF THIS PATIENT: 37 minutes.   POSSIBLE D/C IN 1-2 DAYS, DEPENDING ON CLINICAL CONDITION.   Enid BaasKALISETTI,Harlo Jaso M.D on 11/22/2017 at 8:10 AM  Between 7am to 6pm - Pager - [phone]  After 6pm go to [email or web address] - Social research officer, governmentpassword EPAS [facility]  [facility] [place] [facility]  Office  [phone]  CC: Primary care physician; Corky DownsMasoud, Javed, MD

## 2017-11-22 NOTE — Progress Notes (Signed)
[facility] Critical Care Medicine Progess Note    SYNOPSIS   52 yo female admitted with acute on chronic respiratory failure secondary to AECOPD initially required Bipap, then subsequently intubated on mechanical ventilation  ASSESSMENT/PLAN   Acute on chronic respiratory failure secondary to AECOPD. Patient is influenza A positive. Agree with albuterol, Atrovent, Solu-Medrol, Tamiflu. Was on Levaquin, has been DC'd. atient doing well this morning, wl try spontaneous awakening and breathing trial and evaluate for extubation  Mildly elevated troponin's likely secondary to demand ischemia in setting of respiratory failure   Hyperglycemia. Last glucose 144  Prerenal azotemia with BUN of 22 over creatinine 0.5.  Critical care time 35 minutes   VENTILATOR SETTINGS: Vent Mode: PSV FiO2 (%):  [28 %-35 %] 28 % Set Rate:  [15 bmp] 15 bmp Vt Set:  [500 mL] 500 mL PEEP:  [5 cmH20] 5 cmH20 Pressure Support:  [5 cmH20] 5 cmH20  INTAKE / OUTPUT:  Intake/Output Summary (Last 24 hours) at 11/22/2017 0930 Last data filed at 11/22/2017 1610 Gross per 24 hour  Intake 720 ml  Output 1650 ml  Net -930 ml    Name: Caitlyn Evans MRN: [id number] DOB: [birth year]    ADMISSION DATE:  11/18/2017  SUBJECTIVE:   Pt currently on the ventilator, can not provide history or review of systems.   VITAL SIGNS: Temp:  [97.5 F (36.4 C)-99.6 F (37.6 C)] 99.6 F (37.6 C) (03/09 0400) Pulse Rate:  [79-93] 84 (03/09 0600) Resp:  [14-21] 15 (03/09 0600) BP: (83-145)/(53-82) 123/74 (03/09 0600) SpO2:  [94 %-98 %] 94 % (03/09 0800) FiO2 (%):  [28 %-35 %] 28 % (03/09 0800) Weight:  [54.1 kg (119 lb 4.3 oz)] 54.1 kg (119 lb 4.3 oz) (03/09 0500)  PHYSICAL EXAMINATION: Physical Examination:   VS: BP 123/74   Pulse 84   Temp 99.6 F (37.6 C) (Oral)   Resp 15   Ht 4\' 10"  (1.473 m)   Wt 54.1 kg (119 lb 4.3 oz)   SpO2 94%   BMI 24.93 kg/m   General Appearance: No distress sedated on propofol and  fentanyl Neuro:without focal findings, Limited exam secondary to sedation HEENT: patient orally intubated, no evidence of stridor or accessory muscle utilization Pulmonary: prolonged expiratory phase with diminished breath sounds appreciated   CardiovascularNormal S1,S2.  No m/r/g.   Abdomen: Benign, Soft, non-tender. Skin:   warm, no rashes, no ecchymosis  Extremities: normal, no cyanosis, clubbing.  LABORATORY PANEL:   CBC Recent Labs  Lab 11/19/17 0432  WBC 10.7  HGB 13.0  HCT 41.1  PLT 290    Chemistries  Recent Labs  Lab 11/22/17 0319  NA 140  K 4.3  CL 100*  CO2 34*  GLUCOSE 130*  BUN 25*  CREATININE 0.42*  CALCIUM 8.3*  MG 2.2  PHOS 2.8    Recent Labs  Lab 11/21/17 1602 11/21/17 2021 11/21/17 2357 11/22/17 0417 11/22/17 0748 11/22/17 0816  GLUCAP 112* 126* 140* 147* 144* 176*   Recent Labs  Lab 11/19/17 0316 11/20/17 1144 11/21/17 0930  PHART 7.30* 7.27* 7.37  PCO2ART 56* 78*  --   PO2ART 525* 80* 80*   No results for input(s): AST, ALT, ALKPHOS, BILITOT, ALBUMIN in the last 168 hours.  Cardiac Enzymes Recent Labs  Lab 11/19/17 0432  TROPONINI 0.03*  0.03*    RADIOLOGY:  Dg Chest Port 1 View  Result Date: 11/22/2017 CLINICAL DATA:  Acute respiratory failure EXAM: PORTABLE CHEST 1 VIEW COMPARISON:  November 19, 2017  FINDINGS: The ETT is in good position. The NG tube terminates below today's film. Scarring in the apices, right greater than left, persists. Postsurgical changes in the left apex. The heart, hila, mediastinum, lungs, and pleura are otherwise normal. IMPRESSION: No active disease. Electronically Signed   By: Gerome Samavid  Williams III M.D   On: 11/22/2017 07:46     Tora KindredJohn Jacqlyn Marolf, DO  11/22/2017

## 2017-11-22 NOTE — Progress Notes (Signed)
Pt. Extubated and placed on 3lnc,no apparent distress at this time.

## 2017-11-23 ENCOUNTER — Inpatient Hospital Stay: Payer: Medicare Other

## 2017-11-23 LAB — CULTURE, BLOOD (ROUTINE X 2)
Culture: NO GROWTH
Culture: NO GROWTH
SPECIAL REQUESTS: ADEQUATE
Special Requests: ADEQUATE

## 2017-11-23 LAB — PHOSPHORUS: Phosphorus: 2.8 mg/dL (ref 2.5–4.6)

## 2017-11-23 LAB — BASIC METABOLIC PANEL
ANION GAP: 12 (ref 5–15)
BUN: 17 mg/dL (ref 6–20)
CALCIUM: 9.1 mg/dL (ref 8.9–10.3)
CO2: 27 mmol/L (ref 22–32)
Chloride: 101 mmol/L (ref 101–111)
Creatinine, Ser: 0.46 mg/dL (ref 0.44–1.00)
Glucose, Bld: 117 mg/dL — ABNORMAL HIGH (ref 65–99)
POTASSIUM: 3.8 mmol/L (ref 3.5–5.1)
Sodium: 140 mmol/L (ref 135–145)

## 2017-11-23 LAB — HEMOGLOBIN A1C
Hgb A1c MFr Bld: 6.9 % — ABNORMAL HIGH (ref 4.8–5.6)
MEAN PLASMA GLUCOSE: 151.33 mg/dL

## 2017-11-23 LAB — MAGNESIUM: MAGNESIUM: 2.2 mg/dL (ref 1.7–2.4)

## 2017-11-23 MED ORDER — FUROSEMIDE 10 MG/ML IJ SOLN
40.0000 mg | Freq: Once | INTRAMUSCULAR | Status: AC
  Administered 2017-11-23: 40 mg via INTRAVENOUS
  Filled 2017-11-23: qty 4

## 2017-11-23 MED ORDER — SENNA 8.6 MG PO TABS
1.0000 | ORAL_TABLET | Freq: Two times a day (BID) | ORAL | Status: DC
  Administered 2017-11-23 – 2017-11-25 (×5): 8.6 mg via ORAL
  Filled 2017-11-23 (×5): qty 1

## 2017-11-23 MED ORDER — INSULIN ASPART 100 UNIT/ML ~~LOC~~ SOLN: 0.0000 [IU] | Freq: Three times a day (TID) | SUBCUTANEOUS | Status: DC

## 2017-11-23 MED ORDER — METOPROLOL TARTRATE 25 MG PO TABS
25.0000 mg | ORAL_TABLET | Freq: Two times a day (BID) | ORAL | Status: DC
  Administered 2017-11-23 – 2017-11-25 (×5): 25 mg via ORAL
  Filled 2017-11-23 (×4): qty 1

## 2017-11-23 MED ORDER — PANTOPRAZOLE SODIUM 40 MG PO TBEC
40.0000 mg | DELAYED_RELEASE_TABLET | Freq: Two times a day (BID) | ORAL | Status: DC
  Administered 2017-11-23 – 2017-11-25 (×4): 40 mg via ORAL
  Filled 2017-11-23 (×4): qty 1

## 2017-11-23 MED ORDER — IPRATROPIUM-ALBUTEROL 0.5-2.5 (3) MG/3ML IN SOLN
3.0000 mL | Freq: Four times a day (QID) | RESPIRATORY_TRACT | Status: DC
  Administered 2017-11-23 – 2017-11-25 (×9): 3 mL via RESPIRATORY_TRACT
  Filled 2017-11-23 (×10): qty 3

## 2017-11-23 MED ORDER — DOCUSATE SODIUM 100 MG PO CAPS
100.0000 mg | ORAL_CAPSULE | Freq: Two times a day (BID) | ORAL | Status: DC
  Administered 2017-11-23 – 2017-11-25 (×5): 100 mg via ORAL
  Filled 2017-11-23 (×5): qty 1

## 2017-11-23 MED ORDER — TAMSULOSIN HCL 0.4 MG PO CAPS
0.4000 mg | ORAL_CAPSULE | Freq: Every day | ORAL | Status: DC
  Administered 2017-11-23 – 2017-11-25 (×3): 0.4 mg via ORAL
  Filled 2017-11-23 (×3): qty 1

## 2017-11-23 NOTE — Progress Notes (Addendum)
[facility] Critical Care Medicine Progess Note    SYNOPSIS   52 yo female admitted with acute on chronic respiratory failure secondary to AECOPD initially required Bipap, then subsequently intubated on mechanical ventilation  ASSESSMENT/PLAN   Acute on chronic respiratory failure secondary to AECOPD. Patient is influenza A positive. Agree with albuterol, Atrovent, Solu-Medrol, Tamiflu.  -Doing well post-extubation -Continue neds, and IV steroids  -Continue tamiflu -Continue supplemental O2 -IS  Acute urinary retention  -Start flomax -Ambulate as tolerated  Mildly elevated troponin's likely secondary to demand ischemia in setting of respiratory failure   Hyperglycemia 2/2 steroids.  -Blood glucose monitoring with sliding scale insulin coverage   Prerenal azotemia with BUN of 22 over creatinine 0.5. -Resolving   Patient updated on current treatment plan.  Ok to transfer yo med-surg   INTAKE / OUTPUT:  Intake/Output Summary (Last 24 hours) at 11/23/2017 0734 Last data filed at 11/23/2017 0600 Gross per 24 hour  Intake 6 ml  Output 2890 ml  Net -2884 ml    Name: Caitlyn Evans MRN: [id number] DOB: 12-Feb-1966    ADMISSION DATE:  11/18/2017  SUBJECTIVE:   Pt currently on the ventilator, can not provide history or review of systems.   VITAL SIGNS: Temp:  [98.2 F (36.8 C)-98.9 F (37.2 C)] 98.8 F (37.1 C) (03/10 0300) Pulse Rate:  [78-129] 108 (03/10 0700) Resp:  [10-24] 16 (03/10 0700) BP: (122-195)/(68-127) 162/89 (03/10 0700) SpO2:  [85 %-98 %] 96 % (03/10 0700) FiO2 (%):  [28 %] 28 % (03/09 0800) Weight:  [117 lb 8.1 oz (53.3 kg)] 117 lb 8.1 oz (53.3 kg) (03/10 0500)  PHYSICAL EXAMINATION: Physical Examination:   VS: BP (!) 162/89   Pulse (!) 108   Temp 98.8 F (37.1 C) (Axillary)   Resp 16   Ht 4\' 10"  (1.473 m)   Wt 117 lb 8.1 oz (53.3 kg)   SpO2 96%   BMI 24.56 kg/m   General Appearance: Awake, pleasant Neuro:AAOX 4, no focal  deficits HEENT:PERRLA, oral mucosa moist, trachea midline Pulmonary: prolonged expiratory phase with diminished breath sounds; no wheezing  Cardiovascular: Normal S1,S2.  No m/r/g.   Abdomen: Benign, Soft, non-tender. Skin:   warm, no rashes, no ecchymosis  Extremities: normal, no cyanosis, clubbing.  LABORATORY PANEL:   CBC Recent Labs  Lab 11/19/17 0432  WBC 10.7  HGB 13.0  HCT 41.1  PLT 290    Chemistries  Recent Labs  Lab 11/23/17 0546  NA 140  K 3.8  CL 101  CO2 27  GLUCOSE 117*  BUN 17  CREATININE 0.46  CALCIUM 9.1  MG 2.2  PHOS 2.8    Recent Labs  Lab 11/21/17 2021 11/21/17 2357 11/22/17 0417 11/22/17 0748 11/22/17 0816 11/22/17 1329  GLUCAP 126* 140* 147* 144* 176* 127*   Recent Labs  Lab 11/19/17 0316 11/20/17 1144 11/21/17 0930  PHART 7.30* 7.27* 7.37  PCO2ART 56* 78*  --   PO2ART 525* 80* 80*   No results for input(s): AST, ALT, ALKPHOS, BILITOT, ALBUMIN in the last 168 hours.  Cardiac Enzymes Recent Labs  Lab 11/19/17 0432  TROPONINI 0.03*  0.03*    RADIOLOGY:  Dg Chest Port 1 View  Result Date: 11/22/2017 CLINICAL DATA:  Acute respiratory failure EXAM: PORTABLE CHEST 1 VIEW COMPARISON:  November 19, 2017 FINDINGS: The ETT is in good position. The NG tube terminates below today's film. Scarring in the apices, right greater than left, persists. Postsurgical changes in the left apex. The heart,  hila, mediastinum, lungs, and pleura are otherwise normal. IMPRESSION: No active disease. Electronically Signed   By: Gerome Samavid  Williams III M.D   On: 11/22/2017 07:46     Magdalene S. [facility] ANP-BC Pulmonary and Critical Care Medicine [facility] Pager [phone] or [phone]  NB: This document was prepared using Dragon voice recognition software and may include unintentional dictation errors.  Pulmonary/critical care attending  I have personally seen and examined patient, reviewed revising confirm nurse practitioner's note.  I  independently performed history, physical, reviewed all imaging and laboratory studies.  In short patient doing well, status post extubation, oxygen saturation is stable on 4-5 L, decreased bronchospasm on exam.  Stable for floor transfer.  Tora KindredJohn Vishwa Dais, DO  11/23/2017

## 2017-11-23 NOTE — Progress Notes (Signed)
[facility] - [place] at [facility]   PATIENT NAME: Caitlyn Evans    MR#:  [id number]  DATE OF BIRTH:  [birth year]  SUBJECTIVE:  CHIEF COMPLAINT:   Chief Complaint  Patient presents with  . Respiratory Distress   -Extubated on 11/22/2017. Feels fatigued, still short of breath. Admitted for COPD exacerbation and influenza illness. Chronically on 3 L oxygen  REVIEW OF SYSTEMS:  Review of Systems  Constitutional: Positive for malaise/fatigue. Negative for chills and fever.  Eyes: Negative for blurred vision, double vision and photophobia.  Respiratory: Positive for cough and shortness of breath. Negative for wheezing.   Cardiovascular: Negative for chest pain, palpitations and leg swelling.  Gastrointestinal: Negative for abdominal pain, constipation, diarrhea, nausea and vomiting.  Genitourinary: Negative for dysuria.  Musculoskeletal: Positive for myalgias.  Neurological: Negative for dizziness, speech change, focal weakness, seizures and headaches.  Psychiatric/Behavioral: Negative for depression.    DRUG ALLERGIES:   Allergies  Allergen Reactions  . Penicillins Hives and Other (See Comments)    Has patient had a PCN reaction causing immediate rash, facial/tongue/throat swelling, SOB or lightheadedness with hypotension: No Has patient had a PCN reaction causing severe rash involving mucus membranes or skin necrosis: No Has patient had a PCN reaction that required hospitalization No Has patient had a PCN reaction occurring within the last 10 years: No If all of the above answers are "NO", then may proceed with Cephalosporin use. Other reaction(s): Other (See Comments) Has patient had a PCN reaction causing immediate rash, facial/tongue/throat swelling, SOB or lightheadedness with hypotension: No Has patient had a PCN reaction causing severe rash involving mucus membranes or skin necrosis: No Has patient had a PCN reaction that required hospitalization  No Has patient had a PCN reaction occurring within the last 10 years: No If all of the above answers are "NO", then may proceed with Cephalosporin use.  . Codeine Hives, Nausea And Vomiting and Nausea Only    VITALS:  Blood pressure (!) 162/89, pulse (!) 108, temperature 98.8 F (37.1 C), temperature source Axillary, resp. rate 16, height 4\' 10"  (1.473 m), weight 53.3 kg (117 lb 8.1 oz), SpO2 96 %.  PHYSICAL EXAMINATION:  Physical Exam  GENERAL:  52 y.o.-year-old patient lying in the bed, ill appearing. EYES: Pupils equal, round, reactive to light and accommodation. No scleral icterus. Extraocular muscles intact.  HEENT: Head atraumatic, normocephalic. Oropharynx and nasopharynx clear.  NECK:  Supple, no jugular venous distention. No thyroid enlargement, no tenderness.  LUNGS: Normal breath sounds bilaterally, no wheezing, rales,rhonchi or crepitation. No use of accessory muscles of respiration.  CARDIOVASCULAR: S1, S2 normal. No murmurs, rubs, or gallops.  ABDOMEN: Soft, nontender, nondistended. Bowel sounds present. No organomegaly or mass.  EXTREMITIES: No pedal edema, cyanosis, or clubbing.  NEUROLOGIC: Cranial nerves intact. No focal motor or sensory deficits. Global weakness noted. Following simple commands. Gait not tested PSYCHIATRIC: The patient is alert and oriented 3  SKIN: No obvious rash, lesion, or ulcer.    LABORATORY PANEL:   CBC Recent Labs  Lab 11/19/17 0432  WBC 10.7  HGB 13.0  HCT 41.1  PLT 290   ------------------------------------------------------------------------------------------------------------------  Chemistries  Recent Labs  Lab 11/23/17 0546  NA 140  K 3.8  CL 101  CO2 27  GLUCOSE 117*  BUN 17  CREATININE 0.46  CALCIUM 9.1  MG 2.2   ------------------------------------------------------------------------------------------------------------------  Cardiac Enzymes Recent Labs  Lab 11/19/17 0432  TROPONINI 0.03*  0.03*    ------------------------------------------------------------------------------------------------------------------  RADIOLOGY:  Dg Chest Port 1 View  Result Date: 11/22/2017 CLINICAL DATA:  Acute respiratory failure EXAM: PORTABLE CHEST 1 VIEW COMPARISON:  November 19, 2017 FINDINGS: The ETT is in good position. The NG tube terminates below today's film. Scarring in the apices, right greater than left, persists. Postsurgical changes in the left apex. The heart, hila, mediastinum, lungs, and pleura are otherwise normal. IMPRESSION: No active disease. Electronically Signed   By: Gerome Sam III M.D   On: 11/22/2017 07:46    EKG:   Orders placed or performed during the hospital encounter of 11/18/17  . ED EKG  . ED EKG  . ED EKG  . ED EKG  . EKG 12-Lead  . EKG 12-Lead  . EKG 12-Lead  . EKG 12-Lead    ASSESSMENT AND PLAN:   52 year old female with past medical history significant for COPD, ongoing smoking, diastolic CHF, history of BOOP and hypertension presents for hospital secondary to worsening shortness of breath and wheezing  1. Acute hypoxic respiratory failure-secondary to COPD exacerbation and also influenza illness. -Extubated on 11/22/2017. Currently on 3 L oxygen via nasal cannula which is her chronic home O2 requirement. -Continue nebulizers, steroids -Appreciate pulmonary consult. -On Tamiflu. Off antibiotics  2. Hypertension-on when necessary hydralazine for now. Noted to be tachycardic as well. Can start oral metoprolol  3. Chronic diastolic congestive heart failure-well compensated. Not on diuretics at this time. Monitor  4. Hyperlipidemia-on Crestor  5. DVT prophylaxis-on Lovenox  Due to weakness, physical therapy consult and encourage ambulation   All the records are reviewed and case discussed with Care Management/Social Workerr. Management plans discussed with the patient, family and they are in agreement.  CODE STATUS: Full code  TOTAL TIME TAKING  CARE OF THIS PATIENT: 37 minutes.   POSSIBLE D/C IN 1-2 DAYS, DEPENDING ON CLINICAL CONDITION.   Enid Baas M.D on 11/23/2017 at 9:14 AM  Between 7am to 6pm - Pager - [phone]  After 6pm go to [email or web address] - Social research officer, government  [facility] [place] [facility]  Office  [phone]  CC: Primary care physician; Corky Downs, MD

## 2017-11-23 NOTE — Evaluation (Signed)
Physical Therapy Evaluation Patient Details Name: Caitlyn Evans MRN: [id number] DOB: Sep 11, 1966 Today's Date: 11/23/2017   History of Present Illness  52 y.o. female with a known history of emphysema, congestive heart failure, boop, hyperlipidemia, hypertension presented to the emergency room with increased shortness of breath.  Clinical Impression  Pt showed good effort with PT exam and was willing to try all she could.  She had issues, however, with becoming fatigued very quickly with minimal exertion and despite 2 attempts at standing/walking was only able to manage walking 2-3 feet with severe fatigue, some anxiety and HR increasing quickly to the 130s with any effort.  Pt still hoping to be able to go home but agrees that at this time she would not be safe and would need STR.      Follow Up Recommendations SNF(pt hoping to signficantly improve and go home)    Equipment Recommendations  Rolling walker with 5" wheels    Recommendations for Other Services       Precautions / Restrictions Precautions Precautions: Fall Restrictions Weight Bearing Restrictions: No      Mobility  Bed Mobility Overal bed mobility: Needs Assistance Bed Mobility: Supine to Sit;Sit to Supine     Supine to sit: Min assist Sit to supine: Min assist   General bed mobility comments: Pt needed only light assist to get to/from sitting, fatigued with the minimal effort  Transfers Overall transfer level: Modified independent Equipment used: Rolling walker (2 wheeled)             General transfer comment: Pt was able to rise to standing X2 w/o direct assist, cautious with some unsteadiness  Ambulation/Gait Ambulation/Gait assistance: Min assist Ambulation Distance (Feet): 3 Feet Assistive device: Rolling walker (2 wheeled)       General Gait Details: Pt very anxious and unsteady with 2 attempts at ambulation, her HR increased quickly to the 130s each time and she became anxious and unsteady.   Pt reliant on the walker and struggled to take even a few very small steps at [employer]  Stairs            Wheelchair Mobility    Modified Rankin (Stroke Patients Only)       Balance Overall balance assessment: Needs assistance   Sitting balance-Leahy Scale: Good     Standing balance support: Bilateral upper extremity supported Standing balance-Leahy Scale: Fair                               Pertinent Vitals/Pain Pain Assessment: No/denies pain    Home Living Family/patient expects to be discharged to:: Private residence Living Arrangements: Spouse/significant other Available Help at Discharge: Family                  Prior Function Level of Independence: Independent         Comments: Pt only rarely out of the home, on 2 liters,      Hand Dominance        Extremity/Trunk Assessment   Upper Extremity Assessment Upper Extremity Assessment: Generalized weakness;Overall [facility]FL for tasks assessed    Lower Extremity Assessment Lower Extremity Assessment: Generalized weakness;Overall WFL for tasks assessed       Communication   Communication: No difficulties  Cognition Arousal/Alertness: Awake/alert Behavior During Therapy: WFL for tasks assessed/performed Overall Cognitive Status: Within Functional Limits for tasks assessed  General Comments      Exercises     Assessment/Plan    PT Assessment Patient needs continued PT services  PT Problem List Decreased activity tolerance;Decreased strength;Decreased range of motion;Decreased mobility;Decreased balance       PT Treatment Interventions DME instruction;Gait training;Stair training;Functional mobility training;Therapeutic activities;Therapeutic exercise;Balance training;Neuromuscular re-education;Patient/family education    PT Goals (Current goals can be found in the Care Plan section)  Acute Rehab PT Goals Patient Stated Goal:  go home PT Goal Formulation: With patient Time For Goal Achievement: 12/07/17 Potential to Achieve Goals: Good    Frequency Min 2X/week   Barriers to discharge        Co-evaluation               AM-PAC PT "6 Clicks" Daily Activity  Outcome Measure Difficulty turning over in bed (including adjusting bedclothes, sheets and blankets)?: A Little Difficulty moving from lying on back to sitting on the side of the bed? : Unable Difficulty sitting down on and standing up from a chair with arms (e.g., wheelchair, bedside commode, etc,.)?: A Little Help needed moving to and from a bed to chair (including a wheelchair)?: A Lot Help needed walking in hospital room?: A Lot Help needed climbing 3-5 steps with a railing? : A Lot 6 Click Score: 13    End of Session Equipment Utilized During Treatment: Gait belt;Oxygen(2L, sats stayed in 90s t/o session (drop w/ activity)) Activity Tolerance: Patient limited by fatigue Patient left: in bed;with call bell/phone within reach;with nursing/sitter in room Nurse Communication: Mobility status PT Visit Diagnosis: Muscle weakness (generalized) (M62.81);Difficulty in walking, not elsewhere classified (R26.2)    Time: 4098-1191 PT Time Calculation (min) (ACUTE ONLY): 25 min   Charges:   PT Evaluation $PT Eval Low Complexity: 1 Low     PT G CodesMalachi Pro, DPT 11/23/2017, 3:04 PM

## 2017-11-23 NOTE — Progress Notes (Signed)
Patient became tachycardic in the 140s stating that she "had to pee but couldn't. Bladder scanned for 409. Order received to in and out cath patient. In and out cathed for 390. Patient resting much more comfortably at this time.

## 2017-11-23 NOTE — Progress Notes (Signed)
Caitlyn Evans is alert and oriented. Given hydralazine for systolic pressure above 160 at 0100. Pt complained of abdominal discomfort. Bladder scanned 700 Ml. Pt unable to void on her own. Performed in and out cath patient. Removed 690 ML. Pt is now resting comfortably.

## 2017-11-23 NOTE — Progress Notes (Signed)
PHARMACIST - PHYSICIAN COMMUNICATION   CONCERNING: IV to Oral Route Change Policy  RECOMMENDATION: This patient is receiving PROTONIX by the intravenous route.  Based on criteria approved by the Pharmacy and Therapeutics Committee, the intravenous medication(s) is/are being converted to the equivalent oral dose form(s).   DESCRIPTION: These criteria include:  The patient is eating (either orally or via tube) and/or has been taking other orally administered medications for a least 24 hours  The patient has no evidence of active gastrointestinal bleeding or impaired GI absorption (gastrectomy, short bowel, patient on TNA or NPO).  If you have questions about this conversion, please contact the Pharmacy Department  []   [phone]( [phone] )  [facility] [x]   [phone]( [phone] )  [facility] []   [phone]( [phone] )  [facility] []   [phone]( [phone] )  [facility] []   [phone]( [phone] )  [facility]   Lola Czerwonka A, [facility] 11/23/2017 3:15 PM

## 2017-11-24 LAB — BASIC METABOLIC PANEL
Anion gap: 14 (ref 5–15)
BUN: 42 mg/dL — AB (ref 6–20)
CALCIUM: 9.3 mg/dL (ref 8.9–10.3)
CO2: 29 mmol/L (ref 22–32)
CREATININE: 0.73 mg/dL (ref 0.44–1.00)
Chloride: 95 mmol/L — ABNORMAL LOW (ref 101–111)
GFR calc Af Amer: >60 mL/min (ref >60)
GFR calc non Af Amer: >60 mL/min (ref >60)
Glucose, Bld: 122 mg/dL — ABNORMAL HIGH (ref 65–99)
Potassium: 3.6 mmol/L (ref 3.5–5.1)
SODIUM: 138 mmol/L (ref 135–145)

## 2017-11-24 MED ORDER — GLUCERNA SHAKE PO LIQD
237.0000 mL | Freq: Three times a day (TID) | ORAL | Status: DC
  Administered 2017-11-24 (×3): 237 mL via ORAL

## 2017-11-24 MED ORDER — DILTIAZEM HCL ER COATED BEADS 180 MG PO CP24
180.0000 mg | ORAL_CAPSULE | Freq: Every day | ORAL | Status: DC
  Administered 2017-11-24 – 2017-11-25 (×2): 180 mg via ORAL
  Filled 2017-11-24 (×2): qty 1

## 2017-11-24 MED ORDER — FUROSEMIDE 20 MG PO TABS
20.0000 mg | ORAL_TABLET | Freq: Every day | ORAL | Status: DC
  Administered 2017-11-24 – 2017-11-25 (×2): 20 mg via ORAL
  Filled 2017-11-24 (×2): qty 1

## 2017-11-24 MED ORDER — GUAIFENESIN-DM 100-10 MG/5ML PO SYRP
5.0000 mL | ORAL_SOLUTION | ORAL | Status: DC | PRN
  Administered 2017-11-24 – 2017-11-25 (×4): 5 mL via ORAL
  Filled 2017-11-24 (×4): qty 5

## 2017-11-24 NOTE — Clinical Social Work Note (Signed)
Clinical Social Work Assessment  Patient Details  Name: Caitlyn Evans MRN: [id number] Date of Birth: 04-May-1966  Date of referral:  11/24/17               Reason for consult:  Facility Placement                Permission sought to share information with:    Permission granted to share information::     Name::        Agency::     Relationship::     Contact Information:     Housing/Transportation Living arrangements for the past 2 months:  Single Family Home Source of Information:  Patient Patient Interpreter Needed:  None Criminal Activity/Legal Involvement Pertinent to Current Situation/Hospitalization:  No - Comment as needed Significant Relationships:  Adult Children, Spouse Lives with:  Adult Children, Spouse Do you feel safe going back to the place where you live?  Yes Need for family participation in patient care:  Yes (Comment)  Care giving concerns:  Patient lives in [place] with her husband Caitlyn Evans and 3 grandchildren ages 41, 58 and 43.    Social Worker assessment / plan:  Holiday representative (CSW) received SNF consult. PT is recommending SNF. CSW met with patient alone at bedside to address consult. Patient was alert and oriented X4 and was sitting up on the chair at bedside. CSW introduced self and explained role of CSW department. Patient reported that she lives in [place] with her husband and grandchildren. CSW explained SNF process and that [facility] will have to approve it. Patient refused SNF and stated that she is feeling much better today. Patient reported that she wants to go home with home health. RN case manager aware of above. CSW will continue to follow and assist as needed.    Employment status:  Retired Nurse, adult PT Recommendations:  [place] / Referral to community resources:  Other (Comment Required)(Patient refused SNF. )  Patient/Family's Response to care:  Patient refused SNF.    Patient/Family's Understanding of and Emotional Response to Diagnosis, Current Treatment, and Prognosis:  Patient was very pleasant and thanked CSW for visit.   Emotional Assessment Appearance:  Appears stated age Attitude/Demeanor/Rapport:    Affect (typically observed):  Accepting, Adaptable Orientation:  Oriented to Self, Oriented to Place, Oriented to  Time, Oriented to Situation Alcohol / Substance use:  Not Applicable Psych involvement (Current and /or in the community):  No (Comment)  Discharge Needs  Concerns to be addressed:  Discharge Planning Concerns Readmission within the last 30 days:  No Current discharge risk:  Dependent with Mobility, Chronically ill Barriers to Discharge:  Continued Medical Work up   [employer], Veronia Beets, LCSW 11/24/2017, 3:51 PM

## 2017-11-24 NOTE — Progress Notes (Signed)
[facility] [place] [facility]   PATIENT NAME: Caitlyn Rilesammy Evans    MR#:  [id number]  DATE OF BIRTH:  20-Feb-1966  SUBJECTIVE:  CHIEF COMPLAINT:   Chief Complaint  Patient presents with  . Respiratory Distress   -Extubated on 11/22/2017. Breathing is improving. Heart rate and blood pressure have been elevated yesterday. -PT recommended rehabilitation  REVIEW OF SYSTEMS:  Review of Systems  Constitutional: Positive for malaise/fatigue. Negative for chills and fever.  Eyes: Negative for blurred vision, double vision and photophobia.  Respiratory: Positive for cough and shortness of breath. Negative for wheezing.   Cardiovascular: Negative for chest pain, palpitations and leg swelling.  Gastrointestinal: Negative for abdominal pain, constipation, diarrhea, nausea and vomiting.  Genitourinary: Negative for dysuria.  Musculoskeletal: Positive for myalgias.  Neurological: Negative for dizziness, speech change, focal weakness, seizures and headaches.  Psychiatric/Behavioral: Negative for depression.    DRUG ALLERGIES:   Allergies  Allergen Reactions  . Penicillins Hives and Other (See Comments)    Has patient had a PCN reaction causing immediate rash, facial/tongue/throat swelling, SOB or lightheadedness with hypotension: No Has patient had a PCN reaction causing severe rash involving mucus membranes or skin necrosis: No Has patient had a PCN reaction that required hospitalization No Has patient had a PCN reaction occurring within the last 10 years: No If all of the above answers are "NO", then may proceed with Cephalosporin use. Other reaction(s): Other (See Comments) Has patient had a PCN reaction causing immediate rash, facial/tongue/throat swelling, SOB or lightheadedness with hypotension: No Has patient had a PCN reaction causing severe rash involving mucus membranes or skin necrosis: No Has patient had a PCN reaction that required hospitalization No Has  patient had a PCN reaction occurring within the last 10 years: No If all of the above answers are "NO", then may proceed with Cephalosporin use.  . Codeine Hives, Nausea And Vomiting and Nausea Only    VITALS:  Blood pressure (!) 184/84, pulse (!) 103, temperature 98.8 F (37.1 C), temperature source Oral, resp. rate 19, height 4\' 10"  (1.473 m), weight 49.1 kg (108 lb 4.8 oz), SpO2 95 %.  PHYSICAL EXAMINATION:  Physical Exam  GENERAL:  52 y.o.-year-old patient lying in the bed, ill appearing. EYES: Pupils equal, round, reactive to light and accommodation. No scleral icterus. Extraocular muscles intact.  HEENT: Head atraumatic, normocephalic. Oropharynx and nasopharynx clear.  NECK:  Supple, no jugular venous distention. No thyroid enlargement, no tenderness.  LUNGS: still has diffuse scattered wheezing all over the lung fields today, no rales,rhonchi or crepitation. No use of accessory muscles of respiration.  CARDIOVASCULAR: S1, S2 normal. No murmurs, rubs, or gallops.  ABDOMEN: Soft, nontender, nondistended. Bowel sounds present. No organomegaly or mass.  EXTREMITIES: No pedal edema, cyanosis, or clubbing.  NEUROLOGIC: Cranial nerves intact. No focal motor or sensory deficits. Global weakness noted. Following simple commands. Gait not tested, global weakness noted PSYCHIATRIC: The patient is alert and oriented 3  SKIN: No obvious rash, lesion, or ulcer.    LABORATORY PANEL:   CBC Recent Labs  Lab 11/19/17 0432  WBC 10.7  HGB 13.0  HCT 41.1  PLT 290   ------------------------------------------------------------------------------------------------------------------  Chemistries  Recent Labs  Lab 11/23/17 0546 11/24/17 0400  NA 140 138  K 3.8 3.6  CL 101 95*  CO2 27 29  GLUCOSE 117* 122*  BUN 17 42*  CREATININE 0.46 0.73  CALCIUM 9.1 9.3  MG 2.2  --     ------------------------------------------------------------------------------------------------------------------  Cardiac Enzymes Recent Labs  Lab 11/19/17 0432  TROPONINI 0.03*  0.03*   ------------------------------------------------------------------------------------------------------------------  RADIOLOGY:  Dg Abd 1 View  Result Date: 11/23/2017 CLINICAL DATA:  Recent onset nausea, fatigue, shortness of breath. Recent hospitalization for COPD exacerbation and influenza. EXAM: ABDOMEN - 1 VIEW COMPARISON:  Plain films of the abdomen dated 11/20/2007 and 07/01/2017 FINDINGS: Bowel gas pattern is nonobstructive. No evidence of soft tissue mass or abnormal fluid collection. No evidence of free intraperitoneal air. No pathologic appearing calcifications. Lung bases are clear.  No acute or suspicious osseous finding. IMPRESSION: No acute findings.  Nonobstructive bowel gas pattern. Electronically Signed   By: Bary Richard M.D.   On: 11/23/2017 10:44    EKG:   Orders placed or performed during the hospital encounter of 11/18/17  . ED EKG  . ED EKG  . ED EKG  . ED EKG  . EKG 12-Lead  . EKG 12-Lead  . EKG 12-Lead  . EKG 12-Lead    ASSESSMENT AND PLAN:   52 year old female with past medical history significant for COPD, ongoing smoking, diastolic CHF, history of BOOP and hypertension presents for hospital secondary to worsening shortness of breath and wheezing  1. Acute hypoxic respiratory failure-secondary to COPD exacerbation and also influenza illness. -Extubated on 11/22/2017. Currently on 3 L oxygen via nasal cannula which is her chronic home O2 requirement. -Continue nebulizers, steroids- due to significant wheezing today, will not reduce the steroids -Appreciate pulmonary consult. -finished Tamiflu. Off antibiotics  2. Hypertension- on metoprolol and Cardizem added today. -on when necessary hydralazine for now. Noted to be tachycardic as well.  -started on  metoprolol and oral Cardizem.  3. Chronic diastolic congestive heart failure-well compensated. Not on diuretics at this time. Monitor -added low-dose Lasix  4. Hyperlipidemia-on Crestor  5. DVT prophylaxis-on Lovenox  Due to weakness, physical therapy consult and encourage ambulation- recommended rehabilitation at this time   All the records are reviewed and case discussed with Care Management/Social Workerr. Management plans discussed with the patient, family and they are in agreement.  CODE STATUS: Full code  TOTAL TIME TAKING CARE OF THIS PATIENT: 36 minutes.   POSSIBLE D/C IN 1-2 DAYS, DEPENDING ON CLINICAL CONDITION.   Enid Baas M.D on 11/24/2017 at 9:44 AM  Between 7am to 6pm - Pager - [phone]  After 6pm go to [email or web address] - Social research officer, government  [facility] [place]  Office  [phone]  CC: Primary care physician; Corky Downs, MD

## 2017-11-24 NOTE — NC FL2 (Signed)
[place] LEVEL OF CARE SCREENING TOOL     IDENTIFICATION  Patient Name: Caitlyn Evans Birthdate: [birth year] Sex: female Admission Date (Current Location): 11/18/2017  [place] and [place] Number:  Engineering geologist and Address:  [facility], [address] [place], [place] [address]      Provider Number: [id number]  Attending Physician Name and Address:  Gladstone Lighter, MD  Relative Name and Phone Number:       Current Level of Care: Hospital Recommended Level of Care: [place] Prior Approval Number:    Date Approved/Denied:   PASRR Number: [id number])  Discharge Plan: SNF    Current Diagnoses: Patient Active Problem List   Diagnosis Date Noted  . Esophagitis, unspecified   . Stomach irritation   . Hiatal hernia   . COPD (chronic obstructive pulmonary disease) ([place]) 07/19/2016  . Acute on chronic respiratory failure ([place]) 05/09/2016  . Noncompliance with medication regimen 05/09/2016  . COPD with acute exacerbation ([place]) 02/05/2016  . Acute exacerbation of chronic obstructive pulmonary disease (COPD) ([place])   . Acute respiratory failure ([place])   . Acute respiratory failure with hypoxia ([place]) 12/05/2015  . BOOP (bronchiolitis obliterans with organizing pneumonia) ([place]) 12/05/2015  . Depression, major, single episode, moderate ([place]) 07/14/2015  . Adjustment disorder with anxiety 07/14/2015  . COPD exacerbation ([place]) 07/14/2015  . Special screening for malignant neoplasms, colon   . Swallowing difficulty   . Loss of weight   . Esophagogastric ulcer   . Feline esophagus   . Hypertension 05/30/2015  . Emphysema lung ([place]) 05/30/2015  . Dysphagia, pharyngoesophageal phase 05/30/2015    Orientation RESPIRATION BLADDER Height & Weight     Self, Time, Situation, Place  O2(3 Liters Oxygen. ) Continent Weight: 108 lb 4.8 oz (49.1 kg) Height:  '4\' 10"'  (147.3 cm)  BEHAVIORAL SYMPTOMS/MOOD NEUROLOGICAL BOWEL  NUTRITION STATUS      Continent Diet(Regular Diet. )  AMBULATORY STATUS COMMUNICATION OF NEEDS Skin   Extensive Assist Verbally Normal                       Personal Care Assistance Level of Assistance  Bathing, Feeding, Dressing Bathing Assistance: Limited assistance Feeding assistance: Independent Dressing Assistance: Limited assistance     Functional Limitations Info  Sight, Hearing, Speech Sight Info: Adequate Hearing Info: Adequate Speech Info: Adequate    SPECIAL CARE FACTORS FREQUENCY  PT (By licensed PT), OT (By licensed OT)     PT Frequency: (5) OT Frequency: (5)            Contractures      Additional Factors Info  Code Status, Allergies, Isolation Precautions Code Status Info: (Full Code. ) Allergies Info: (Penicillins, Codeine)     Isolation Precautions Info: (Isolation for Flu)     Current Medications (11/24/2017):  This is the current hospital active medication list Current Facility-Administered Medications  Medication Dose Route Frequency Provider Last Rate Last Dose  . 0.9 %  sodium chloride infusion  250 mL Intravenous PRN Pyreddy, Reatha Harps, MD      . acetaminophen (TYLENOL) tablet 650 mg  650 mg Oral Q6H PRN Saundra Shelling, MD   650 mg at 11/23/17 2310   Or  . acetaminophen (TYLENOL) suppository 650 mg  650 mg Rectal Q6H PRN Saundra Shelling, MD      . ALPRAZolam Duanne Moron) tablet 0.5 mg  0.5 mg Oral TID Conforti, John, DO   0.5 mg at 11/24/17 0826  .  budesonide (PULMICORT) nebulizer solution 0.25 mg  0.25 mg Nebulization BID Awilda Bill, NP   0.25 mg at 11/24/17 0727  . chlorhexidine gluconate (MEDLINE KIT) (PERIDEX) 0.12 % solution 15 mL  15 mL Mouth Rinse BID Flora Lipps, MD   15 mL at 11/24/17 0828  . docusate sodium (COLACE) capsule 100 mg  100 mg Oral BID Conforti, John, DO   100 mg at 11/24/17 0827  . donepezil (ARICEPT) tablet 10 mg  10 mg Per Tube Daily Awilda Bill, NP   10 mg at 11/24/17 0825  . enoxaparin (LOVENOX) injection 40  mg  40 mg Subcutaneous Q24H Pyreddy, Pavan, MD   40 mg at 11/21/17 2213  . guaiFENesin-dextromethorphan (ROBITUSSIN DM) 100-10 MG/5ML syrup 5 mL  5 mL Oral Q4H PRN Gladstone Lighter, MD   5 mL at 11/24/17 0236  . hydrALAZINE (APRESOLINE) injection 10-20 mg  10-20 mg Intravenous Q4H PRN Awilda Bill, NP   10 mg at 11/23/17 0051  . ipratropium-albuterol (DUONEB) 0.5-2.5 (3) MG/3ML nebulizer solution 3 mL  3 mL Nebulization Q6H PRN Awilda Bill, NP      . ipratropium-albuterol (DUONEB) 0.5-2.5 (3) MG/3ML nebulizer solution 3 mL  3 mL Nebulization Q6H Gladstone Lighter, MD   3 mL at 11/24/17 0727  . methylPREDNISolone sodium succinate (SOLU-MEDROL) 125 mg/2 mL injection 60 mg  60 mg Intravenous Q6H Pyreddy, Reatha Harps, MD   60 mg at 11/24/17 0826  . metoprolol tartrate (LOPRESSOR) tablet 25 mg  25 mg Oral BID Dorene Sorrow S, NP   25 mg at 11/24/17 0827  . ondansetron (ZOFRAN) tablet 4 mg  4 mg Oral Q6H PRN Saundra Shelling, MD       Or  . ondansetron (ZOFRAN) injection 4 mg  4 mg Intravenous Q6H PRN Saundra Shelling, MD   4 mg at 11/23/17 0927  . pantoprazole (PROTONIX) EC tablet 40 mg  40 mg Oral BID AC Gladstone Lighter, MD   40 mg at 11/24/17 0827  . phenol (CHLORASEPTIC) mouth spray 1 spray  1 spray Mouth/Throat PRN Dorene Sorrow S, NP      . rosuvastatin (CRESTOR) tablet 10 mg  10 mg Oral Daily Conforti, John, DO   10 mg at 11/24/17 0826  . senna (SENOKOT) tablet 8.6 mg  1 tablet Oral BID Conforti, John, DO   8.6 mg at 11/24/17 0826  . sennosides (SENOKOT) 8.8 MG/5ML syrup 5 mL  5 mL Oral QHS PRN Awilda Bill, NP      . sodium chloride flush (NS) 0.9 % injection 3 mL  3 mL Intravenous Q12H Saundra Shelling, MD   3 mL at 11/24/17 0829  . sodium chloride flush (NS) 0.9 % injection 3 mL  3 mL Intravenous PRN Pyreddy, Reatha Harps, MD      . tamsulosin (FLOMAX) capsule 0.4 mg  0.4 mg Oral Daily Dorene Sorrow S, NP   0.4 mg at 11/24/17 0827     Discharge Medications: Please see discharge  summary for a list of discharge medications.  Relevant Imaging Results:  Relevant Lab Results:   Additional Information (SSN: [id number])  Cari Burgo, Veronia Beets, LCSW

## 2017-11-24 NOTE — Care Management Note (Signed)
Case Management Note  Patient Details  Name: Caitlyn Evans MRN: [id number] Date of Birth: 09/16/1966  Subjective/Objective:   Met with patient at bedside to discuss discharge planning. Discuss PT recommendations. Patient adamantly refuses SNF. Discussed RN and PT services. She refused that as well. She states she has dogs that will bite and she doesn't want the liability. She has chronic O2 @ 3L through [facility]. She has a walker.  Patient lives with her spouse and 3 grandchildren. Encouraged her to reconsider home health before discharge. She again refused.                   Action/Plan:   Expected Discharge Date:                  Expected Discharge Plan:  Home/Self Care  In-House Referral:     Discharge planning Services  CM Consult  Post Acute Care Choice:  Home Health Choice offered to:  Patient  DME Arranged:    DME Agency:     HH Arranged:  Patient Refused [place] Agency:     Status of Service:  Completed, signed off  If discussed at [employer] of Stay Meetings, dates discussed:    Additional Comments:  Jolly Mango, RN 11/24/2017, 3:20 PM

## 2017-11-24 NOTE — Progress Notes (Signed)
Nutrition Follow-up  INTERVENTION:   - Glucerna Shake po TID, each supplement provides 220 kcal and 10 grams of protein  NUTRITION DIAGNOSIS:   Inadequate oral intake related to inability to eat as evidenced by NPO status.  Progressing as pt is now on a regular diet  GOAL:   Patient will meet greater than or equal to 90% of their needs  Porgressing  MONITOR:   PO intake, Supplement acceptance, Weight trends, I & O's, Labs  REASON FOR ASSESSMENT:   Ventilator, Consult Enteral/tube feeding initiation and management  ASSESSMENT:   52 year old female with PMHx of migraines, hypercholesterolemia, emphysema, lung mass, anxiety, hx MI 2012, BOOP, asthma, HTN, CHF, hx pharyngoesophageal phase dysphagia in 2016 who is admitted with acute on chronic respiratory failure secondary to acute exacerbation of COPD and flu and was emergently intubated early AM of 3/6.  11/22/17 - pt extubated, OGT removed and TF d/c  Spoke with pt at bedside who reports appetite is slightly improving. Pt states she did not eat anything yesterday but had "a little bit" for fruit this morning for breakfast. No meal completion listed in chart at time of visit. Pt requested RD order lunch. RD ordered pot roast, creamed potatoes, and gravy per pt request.  Pt agreeable to receiving oral nutrition supplement during hospital stay. RD ordered Glucerna given pt's high CBG's and hemoglobin A1C of 6.9 on 11/23/17.  RD noted pt on several stool softeners and without a BM since 11/18/17. RD also noted weight loss during current hospitalization. Per chart, pt weighed 55 kg upon admission and now weighs 49.1 kg. This is a 10.7% weight loss. RD unsure of accuracy of most recent weight as it would indicate a 4.2 kg weight loss in 1 day. RD suspects some weight loss due to fluid loss.   I/O's: -3.4 L since admission  Medications reviewed and include: 100 mg Colace BID, 40 mg Protonix BID before meals, 8.6 mg [place] BID, 20 mg  Lasix daily  Labs reviewed: chloride 95 (L), BUN 42 (H), hemoglobin A1C 6.9 CBG's: 127, 176, 144, 147 on 11/22/17   Diet Order:  Diet regular Room service appropriate? Yes with Assist; Fluid consistency: Thin  EDUCATION NEEDS:   No education needs have been identified at this time  Skin:  Skin Assessment: Reviewed RN Assessment(ecchymosis to buttocks and legs)  Last BM:  11/18/17  Height:   Ht Readings from Last 1 Encounters:  11/18/17 4\' 10"  (1.473 m)    Weight:   Wt Readings from Last 1 Encounters:  11/24/17 108 lb 4.8 oz (49.1 kg)    Ideal Body Weight:  43.9 kg  BMI:  Body mass index is 22.63 kg/m.  Estimated Nutritional Needs:   Kcal:  1300-1500 kcal/day  Protein:  62-77 grams (1.2-1.5 grams/kg)  Fluid:  1.5-1.8 L/day (30-35 mL/kg)    Earma ReadingKate Jablonski Malvern Kadlec, MS, RD, LDN Pager: [phone] Weekend/After Hours: [phone]

## 2017-11-25 LAB — BASIC METABOLIC PANEL
ANION GAP: 11 (ref 5–15)
BUN: 35 mg/dL — AB (ref 6–20)
CO2: 30 mmol/L (ref 22–32)
Calcium: 8.9 mg/dL (ref 8.9–10.3)
Chloride: 96 mmol/L — ABNORMAL LOW (ref 101–111)
Creatinine, Ser: 0.37 mg/dL — ABNORMAL LOW (ref 0.44–1.00)
GFR calc Af Amer: >60 mL/min (ref >60)
GFR calc non Af Amer: >60 mL/min (ref >60)
GLUCOSE: 120 mg/dL — AB (ref 65–99)
POTASSIUM: 3.5 mmol/L (ref 3.5–5.1)
Sodium: 137 mmol/L (ref 135–145)

## 2017-11-25 LAB — TRIGLYCERIDES: Triglycerides: 107 mg/dL (ref <150)

## 2017-11-25 MED ORDER — PREDNISONE 10 MG PO TABS: 10.0000 mg | ORAL_TABLET | Freq: Three times a day (TID) | ORAL | 2 refills | Status: DC

## 2017-11-25 MED ORDER — FUROSEMIDE 20 MG PO TABS: 20.0000 mg | ORAL_TABLET | Freq: Every day | ORAL | 2 refills | Status: DC

## 2017-11-25 MED ORDER — DILTIAZEM HCL ER COATED BEADS 180 MG PO CP24: 180.0000 mg | ORAL_CAPSULE | Freq: Every day | ORAL | 2 refills | Status: DC

## 2017-11-25 MED ORDER — PREDNISONE 10 MG (21) PO TBPK: ORAL_TABLET | ORAL | 0 refills | Status: DC

## 2017-11-25 MED ORDER — TAMSULOSIN HCL 0.4 MG PO CAPS: 0.4000 mg | ORAL_CAPSULE | Freq: Every day | ORAL | 2 refills | Status: DC

## 2017-11-25 MED ORDER — METOPROLOL TARTRATE 25 MG PO TABS: 25.0000 mg | ORAL_TABLET | Freq: Two times a day (BID) | ORAL | 2 refills | Status: DC

## 2017-11-25 NOTE — Discharge Summary (Signed)
[facility] [place] [facility]   PATIENT NAME: Caitlyn Evans    MR#:  [id number]  DATE OF BIRTH:  [birth year]  DATE OF ADMISSION:  11/18/2017   ADMITTING PHYSICIAN: Ihor AustinPavan Pyreddy, MD  DATE OF DISCHARGE: 11/25/17  PRIMARY CARE PHYSICIAN: Corky DownsMasoud, Javed, MD   ADMISSION DIAGNOSIS:   Healthcare-associated pneumonia [J18.9] COPD exacerbation (HCC) [J44.1]  DISCHARGE DIAGNOSIS:   Active Problems:   COPD exacerbation (HCC)   SECONDARY DIAGNOSIS:   Past Medical History:  Diagnosis Date  . Anxiety    panic attacks  . Asthma   . BOOP (bronchiolitis obliterans with organizing pneumonia) (HCC)   . CHF (congestive heart failure) (HCC)   . Dysphagia, pharyngoesophageal phase 05/30/2015  . Emphysema lung (HCC) 05/30/2015  . Hypercholesterolemia   . Hypertension   . Lung mass   . Migraines   . Motion sickness    all moving vehicles  . Myocardial infarction (HCC) 2012  . Seasonal allergies   . Shortness of breath dyspnea    1 flight-stairs    HOSPITAL COURSE:   52 year old female with past medical history significant for COPD, ongoing smoking, diastolic CHF, history of BOOP and hypertension presents for hospital secondary to worsening shortness of breath and wheezing  1. Acute hypoxic respiratory failure-secondary to COPD exacerbation and also influenza illness. -Extubated on 11/22/2017. on 3 L oxygen via nasal cannula which is her chronic home O2 requirement. -received nebulizers, IV steroids-much improved today and will discharge on prednisone taper -Appreciate pulmonary consult. -finished Tamiflu. Off antibiotics  2. Hypertension- on metoprolol and Cardizem . - tachycardia improved.   3. Chronic diastolic congestive heart failure-well compensated.  - low dose lasix at discharge  4. Hyperlipidemia-on Crestor  5. GERD- protonix  When physical therapy last evaluated, they have recommended rehabilitation. However patient has significantly  improved. She has been ambulating in the room. Once to go home. Will be discharged home,patient refused home health.   DISCHARGE CONDITIONS:   Guarded  CONSULTS OBTAINED:   None  DRUG ALLERGIES:   Allergies  Allergen Reactions  . Penicillins Hives and Other (See Comments)    Has patient had a PCN reaction causing immediate rash, facial/tongue/throat swelling, SOB or lightheadedness with hypotension: No Has patient had a PCN reaction causing severe rash involving mucus membranes or skin necrosis: No Has patient had a PCN reaction that required hospitalization No Has patient had a PCN reaction occurring within the last 10 years: No If all of the above answers are "NO", then may proceed with Cephalosporin use. Other reaction(s): Other (See Comments) Has patient had a PCN reaction causing immediate rash, facial/tongue/throat swelling, SOB or lightheadedness with hypotension: No Has patient had a PCN reaction causing severe rash involving mucus membranes or skin necrosis: No Has patient had a PCN reaction that required hospitalization No Has patient had a PCN reaction occurring within the last 10 years: No If all of the above answers are "NO", then may proceed with Cephalosporin use.  . Codeine Hives, Nausea And Vomiting and Nausea Only   DISCHARGE MEDICATIONS:   Allergies as of 11/25/2017      Reactions   Penicillins Hives, Other (See Comments)   Has patient had a PCN reaction causing immediate rash, facial/tongue/throat swelling, SOB or lightheadedness with hypotension: No Has patient had a PCN reaction causing severe rash involving mucus membranes or skin necrosis: No Has patient had a PCN reaction that required hospitalization No Has patient had a PCN reaction occurring within the last  10 years: No If all of the above answers are "NO", then may proceed with Cephalosporin use. Other reaction(s): Other (See Comments) Has patient had a PCN reaction causing immediate rash,  facial/tongue/throat swelling, SOB or lightheadedness with hypotension: No Has patient had a PCN reaction causing severe rash involving mucus membranes or skin necrosis: No Has patient had a PCN reaction that required hospitalization No Has patient had a PCN reaction occurring within the last 10 years: No If all of the above answers are "NO", then may proceed with Cephalosporin use.   Codeine Hives, Nausea And Vomiting, Nausea Only      Medication List    STOP taking these medications   fluconazole 200 MG tablet Commonly known as:  DIFLUCAN     TAKE these medications   ALPRAZolam 0.5 MG tablet Commonly known as:  XANAX Take 0.5 mg by mouth 3 (three) times daily.   budesonide 0.25 MG/2ML nebulizer solution Commonly known as:  PULMICORT Take 2 mLs (0.25 mg total) by nebulization every 6 (six) hours.   diltiazem 180 MG 24 hr capsule Commonly known as:  CARDIZEM CD Take 1 capsule (180 mg total) by mouth daily. Start taking on:  11/26/2017   donepezil 10 MG tablet Commonly known as:  ARICEPT Take 10 mg by mouth daily.   furosemide 20 MG tablet Commonly known as:  LASIX Take 1 tablet (20 mg total) by mouth daily. Start taking on:  11/26/2017   ipratropium-albuterol 0.5-2.5 (3) MG/3ML Soln Commonly known as:  DUONEB Inhale 3 mLs into the lungs every 4 (four) hours as needed (for wheezing/shortness of breath). What changed:    reasons to take this  additional instructions   metoprolol tartrate 25 MG tablet Commonly known as:  LOPRESSOR Take 1 tablet (25 mg total) by mouth 2 (two) times daily.   pantoprazole 40 MG tablet Commonly known as:  PROTONIX Take 1 tablet (40 mg total) by mouth 2 (two) times daily.   predniSONE 10 MG (21) Tbpk tablet Commonly known as:  STERAPRED UNI-PAK 21 TAB 6 tabs PO x 1 day 5 tabs PO x 1 day 4 tabs PO x 1 day 3 tabs PO x 1 day 2 tabs PO x 1 day 1 tab PO x 1 day and stop What changed:  You were already taking a medication with the same  name, and this prescription was added. Make sure you understand how and when to take each.   predniSONE 10 MG tablet Commonly known as:  DELTASONE Take 1 tablet (10 mg total) by mouth 3 (three) times daily. Start taking on:  11/28/2017 What changed:  These instructions start on 11/28/2017. If you are unsure what to do until then, ask your doctor or other care provider.   rosuvastatin 10 MG tablet Commonly known as:  CRESTOR Take 10 mg by mouth daily.   tamsulosin 0.4 MG Caps capsule Commonly known as:  FLOMAX Take 1 capsule (0.4 mg total) by mouth daily. Start taking on:  11/26/2017        DISCHARGE INSTRUCTIONS:   1. PCP follow-up in 1-2 weeks 2. Pulmonary  Follow-up in 1-2 weeks  DIET:   Cardiac diet  ACTIVITY:   Activity as tolerated  OXYGEN:   Home Oxygen: Yes.    Oxygen Delivery: 3 liters/min via Patient connected to nasal cannula oxygen  DISCHARGE LOCATION:   home   If you experience worsening of your admission symptoms, develop shortness of breath, life threatening emergency, suicidal or homicidal thoughts you must  seek medical attention immediately by calling 911 or calling your MD immediately  if symptoms less severe.  You Must read complete instructions/literature along with all the possible adverse reactions/side effects for all the Medicines you take and that have been prescribed to you. Take any new Medicines after you have completely understood and accpet all the possible adverse reactions/side effects.   Please note  You were cared for by a hospitalist during your hospital stay. If you have any questions about your discharge medications or the care you received while you were in the hospital after you are discharged, you can call the unit and asked to speak with the hospitalist on call if the hospitalist that took care of you is not available. Once you are discharged, your primary care physician will handle any further medical issues. Please note that NO  REFILLS for any discharge medications will be authorized once you are discharged, as it is imperative that you return to your primary care physician (or establish a relationship with a primary care physician if you do not have one) for your aftercare needs so that they can reassess your need for medications and monitor your lab values.    On the day of Discharge:  VITAL SIGNS:   Blood pressure (!) 141/79, pulse 97, temperature 97.6 F (36.4 C), temperature source Oral, resp. rate (!) 25, height 4\' 10"  (1.473 m), weight 50.9 kg (112 lb 3.4 oz), SpO2 96 %.  PHYSICAL EXAMINATION:    GENERAL:  52 y.o.-year-old patient lying in the bed, not in any acute distress EYES: Pupils equal, round, reactive to light and accommodation. No scleral icterus. Extraocular muscles intact.  HEENT: Head atraumatic, normocephalic. Oropharynx and nasopharynx clear.  NECK:  Supple, no jugular venous distention. No thyroid enlargement, no tenderness.  LUNGS: scant breath sounds but moving air bilaterally, minimal scattered wheezing today, no rales,rhonchi or crepitation. No use of accessory muscles of respiration.  CARDIOVASCULAR: S1, S2 normal. No murmurs, rubs, or gallops.  ABDOMEN: Soft, nontender, nondistended. Bowel sounds present. No organomegaly or mass.  EXTREMITIES: No pedal edema, cyanosis, or clubbing.  NEUROLOGIC: Cranial nerves intact. No focal motor or sensory deficits. Global weakness noted. Following simple commands. Gait not tested, global weakness noted PSYCHIATRIC: The patient is alert and oriented 3  SKIN: No obvious rash, lesion, or ulcer.   DATA REVIEW:   CBC Recent Labs  Lab 11/19/17 0432  WBC 10.7  HGB 13.0  HCT 41.1  PLT 290    Chemistries  Recent Labs  Lab 11/23/17 0546  11/25/17 0412  NA 140   < > 137  K 3.8   < > 3.5  CL 101   < > 96*  CO2 27   < > 30  GLUCOSE 117*   < > 120*  BUN 17   < > 35*  CREATININE 0.46   < > 0.37*  CALCIUM 9.1   < > 8.9  MG 2.2  --   --      < > = values in this interval not displayed.     Microbiology Results  Results for orders placed or performed during the hospital encounter of 11/18/17  Culture, blood (Routine x 2)     Status: None   Collection Time: 11/18/17  5:52 PM  Result Value Ref Range Status   Specimen Description BLOOD RAC  Final   Special Requests   Final    BOTTLES DRAWN AEROBIC AND ANAEROBIC Blood Culture adequate volume   Culture  Final    NO GROWTH 5 DAYS Performed at [facility], [address] [place], [place] [address]    Report Status 11/23/2017 FINAL  Final  Culture, blood (Routine x 2)     Status: None   Collection Time: 11/18/17  5:53 PM  Result Value Ref Range Status   Specimen Description BLOOD LEFT HAND  Final   Special Requests   Final    BOTTLES DRAWN AEROBIC AND ANAEROBIC Blood Culture adequate volume   Culture   Final    NO GROWTH 5 DAYS Performed at [facility], [address] [place], [place] [address]    Report Status 11/23/2017 FINAL  Final  MRSA PCR Screening     Status: None   Collection Time: 11/18/17  8:21 PM  Result Value Ref Range Status   MRSA by PCR NEGATIVE NEGATIVE Final    Comment:        The GeneXpert MRSA Assay (FDA approved for NASAL specimens only), is one component of a comprehensive MRSA colonization surveillance program. It is not intended to diagnose MRSA infection nor to guide or monitor treatment for MRSA infections. Performed at [facility], [address], [place], [place] [address]     RADIOLOGY:  No results found.   Management plans discussed with the patient, family and they are in agreement.  CODE STATUS:     Code Status Orders  (From admission, onward)        Start     Ordered   11/18/17 2001  Full code  Continuous     11/18/17 2000    Code Status History    Date Active Date Inactive Code Status Order ID Comments User Context   07/01/2017 20:23 07/04/2017 16:48 Full Code [id number]   Altamese Dilling, MD ED   07/19/2016 11:48 07/20/2016 20:10 Full Code [id number]  Adrian Saran, MD Inpatient   05/09/2016 14:40 05/13/2016 14:37 Full Code [id number]  Merwyn Katos, MD ED   02/05/2016 19:14 02/08/2016 14:52 Full Code [id number]  Ramonita Lab, MD Inpatient   12/05/2015 23:02 12/09/2015 15:35 Full Code [id number]  Oralia Manis, MD Inpatient    Advance Directive Documentation     Most Recent Value  Type of Advance Directive  Living will  Pre-existing out of facility DNR order (yellow form or pink MOST form)  No data  "MOST" Form in Place?  No data      TOTAL TIME TAKING CARE OF THIS PATIENT: 38 minutes.    Enid Baas M.D on 11/25/2017 at 12:54 PM  Between 7am to 6pm - Pager - [phone]  After 6pm go to [email or web address] - Social research officer, government  [facility]  Office  [phone]  CC: Primary care physician; Corky Downs, MD   Note: This dictation was prepared with Dragon dictation along with smaller phrase technology. Any transcriptional errors that result from this process are unintentional.

## 2017-11-25 NOTE — Progress Notes (Signed)
Pt is in no acute distress. VSS. Discharge instructions explained to pt and family member. Pt verbalized understanding. Pt removed IV herself prior to RN entering room. RN held pressure to site and covered with gauze and tape. Pt wheeled to visitors entrance and assisted into car by NT.

## 2017-12-01 DIAGNOSIS — J9610 Chronic respiratory failure, unspecified whether with hypoxia or hypercapnia: Secondary | ICD-10-CM | POA: Diagnosis not present

## 2017-12-01 DIAGNOSIS — I43 Cardiomyopathy in diseases classified elsewhere: Secondary | ICD-10-CM | POA: Diagnosis not present

## 2017-12-01 DIAGNOSIS — R042 Hemoptysis: Secondary | ICD-10-CM | POA: Diagnosis not present

## 2017-12-01 DIAGNOSIS — I119 Hypertensive heart disease without heart failure: Secondary | ICD-10-CM | POA: Diagnosis not present

## 2017-12-11 DIAGNOSIS — I119 Hypertensive heart disease without heart failure: Secondary | ICD-10-CM | POA: Diagnosis not present

## 2017-12-11 DIAGNOSIS — K222 Esophageal obstruction: Secondary | ICD-10-CM | POA: Diagnosis not present

## 2017-12-11 DIAGNOSIS — J439 Emphysema, unspecified: Secondary | ICD-10-CM | POA: Diagnosis not present

## 2017-12-11 DIAGNOSIS — J449 Chronic obstructive pulmonary disease, unspecified: Secondary | ICD-10-CM | POA: Diagnosis not present

## 2017-12-17 DIAGNOSIS — H25813 Combined forms of age-related cataract, bilateral: Secondary | ICD-10-CM | POA: Diagnosis not present

## 2017-12-18 DIAGNOSIS — J441 Chronic obstructive pulmonary disease with (acute) exacerbation: Secondary | ICD-10-CM | POA: Diagnosis not present

## 2017-12-27 ENCOUNTER — Encounter: Payer: Self-pay | Admitting: Emergency Medicine

## 2017-12-27 ENCOUNTER — Emergency Department
Admission: EM | Admit: 2017-12-27 | Discharge: 2017-12-27 | Disposition: A | Discharge status: Home / Self Care | Payer: Medicare Other | Attending: Emergency Medicine | Admitting: Emergency Medicine

## 2017-12-27 ENCOUNTER — Other Ambulatory Visit: Payer: Self-pay

## 2017-12-27 DIAGNOSIS — J449 Chronic obstructive pulmonary disease, unspecified: Secondary | ICD-10-CM | POA: Diagnosis not present

## 2017-12-27 DIAGNOSIS — I509 Heart failure, unspecified: Secondary | ICD-10-CM | POA: Insufficient documentation

## 2017-12-27 DIAGNOSIS — Z23 Encounter for immunization: Secondary | ICD-10-CM | POA: Insufficient documentation

## 2017-12-27 DIAGNOSIS — J439 Emphysema, unspecified: Secondary | ICD-10-CM | POA: Insufficient documentation

## 2017-12-27 DIAGNOSIS — Z79899 Other long term (current) drug therapy: Secondary | ICD-10-CM | POA: Diagnosis not present

## 2017-12-27 DIAGNOSIS — F17210 Nicotine dependence, cigarettes, uncomplicated: Secondary | ICD-10-CM | POA: Diagnosis not present

## 2017-12-27 DIAGNOSIS — S51841A Puncture wound with foreign body of right forearm, initial encounter: Secondary | ICD-10-CM | POA: Diagnosis not present

## 2017-12-27 DIAGNOSIS — M795 Residual foreign body in soft tissue: Secondary | ICD-10-CM | POA: Diagnosis not present

## 2017-12-27 DIAGNOSIS — I110 Hypertensive heart disease with heart failure: Secondary | ICD-10-CM | POA: Diagnosis not present

## 2017-12-27 MED ORDER — TETANUS-DIPHTH-ACELL PERTUSSIS 5-2.5-18.5 LF-MCG/0.5 IM SUSP
0.5000 mL | Freq: Once | INTRAMUSCULAR | Status: AC
  Administered 2017-12-27: 0.5 mL via INTRAMUSCULAR
  Filled 2017-12-27: qty 0.5

## 2017-12-27 NOTE — ED Triage Notes (Signed)
Fish hook in right arm

## 2017-12-27 NOTE — ED Provider Notes (Signed)
[facility] Emergency Department Provider Note ____________________________________________  Time seen: 1640  I have reviewed the triage vital signs and the nursing notes.  HISTORY  Chief Complaint  Foreign Body in Skin   HPI Caitlyn Evans is a 52 y.o. female presents to the ER with c/o a fish hook stuck in her arm. She reports this occurred about 1 hour ago. She was putting her mail away and a fish hook that was hanging on her [place] cabinet stuck in her arm. She did not try to remove it. It only bled minimally. She has not tried anything OTC for pain and did not cleanse the area prior to arrival. She is unsure the last time she had a tetanus injection.  Past Medical History:  Diagnosis Date  . Anxiety    panic attacks  . Asthma   . BOOP (bronchiolitis obliterans with organizing pneumonia) (HCC)   . CHF (congestive heart failure) (HCC)   . Dysphagia, pharyngoesophageal phase 05/30/2015  . Emphysema lung (HCC) 05/30/2015  . Hypercholesterolemia   . Hypertension   . Lung mass   . Migraines   . Motion sickness    all moving vehicles  . Myocardial infarction (HCC) 2012  . Seasonal allergies   . Shortness of breath dyspnea    1 flight-stairs    Patient Active Problem List   Diagnosis Date Noted  . Esophagitis, unspecified   . Stomach irritation   . Hiatal hernia   . COPD (chronic obstructive pulmonary disease) (HCC) 07/19/2016  . Acute on chronic respiratory failure (HCC) 05/09/2016  . Noncompliance with medication regimen 05/09/2016  . COPD with acute exacerbation (HCC) 02/05/2016  . Acute exacerbation of chronic obstructive pulmonary disease (COPD) (HCC)   . Acute respiratory failure (HCC)   . Acute respiratory failure with hypoxia (HCC) 12/05/2015  . BOOP (bronchiolitis obliterans with organizing pneumonia) (HCC) 12/05/2015  . Depression, major, single episode, moderate (HCC) 07/14/2015  . Adjustment disorder with anxiety 07/14/2015  . COPD  exacerbation (HCC) 07/14/2015  . Special screening for malignant neoplasms, colon   . Swallowing difficulty   . Loss of weight   . Esophagogastric ulcer   . Feline esophagus   . Hypertension 05/30/2015  . Emphysema lung (HCC) 05/30/2015  . Dysphagia, pharyngoesophageal phase 05/30/2015    Past Surgical History:  Procedure Laterality Date  . COLONOSCOPY WITH PROPOFOL N/A 06/02/2015   Procedure: COLONOSCOPY WITH PROPOFOL;  Surgeon: Midge Miniumarren Wohl, MD;  [facility];  Service: Endoscopy;  Laterality: N/A;  . ESOPHAGOGASTRODUODENOSCOPY (EGD) WITH PROPOFOL N/A 06/02/2015   Procedure: ESOPHAGOGASTRODUODENOSCOPY (EGD) WITH PROPOFOL withdialation;  Surgeon: Midge Miniumarren Wohl, MD;  [facility];  Service: Endoscopy;  Laterality: N/A;  . ESOPHAGOGASTRODUODENOSCOPY (EGD) WITH PROPOFOL N/A 11/05/2017   Procedure: ESOPHAGOGASTRODUODENOSCOPY (EGD) WITH PROPOFOL;  Surgeon: Pasty Spillersahiliani, Varnita B, MD;  Location: [facility] ENDOSCOPY;  Service: Endoscopy;  Laterality: N/A;  . LUNG SURGERY Left    Upper lobe removed  . OOPHORECTOMY Left   . VAGINAL HYSTERECTOMY      Prior to Admission medications   Medication Sig Start Date End Date Taking? Authorizing Provider  ALPRAZolam Prudy Feeler(XANAX) 0.5 MG tablet Take 0.5 mg by mouth 3 (three) times daily.     [provider]  budesonide (PULMICORT) 0.25 MG/2ML nebulizer solution Take 2 mLs (0.25 mg total) by nebulization every 6 (six) hours. Patient not taking: Reported on 11/18/2017 07/04/17   Altamese DillingVachhani, Vaibhavkumar, MD  diltiazem (CARDIZEM CD) 180 MG 24 hr capsule Take 1 capsule (180  mg total) by mouth daily. 11/26/17   Enid Baas, MD  donepezil (ARICEPT) 10 MG tablet Take 10 mg by mouth daily.     [provider]  furosemide (LASIX) 20 MG tablet Take 1 tablet (20 mg total) by mouth daily. 11/26/17   Enid Baas, MD  ipratropium-albuterol (DUONEB) 0.5-2.5 (3) MG/3ML SOLN Inhale 3 mLs into the lungs every 4 (four) hours as  needed (for wheezing/shortness of breath). Patient taking differently: Inhale 3 mLs into the lungs every 4 (four) hours as needed. For wheezing/shortness of breath 05/13/16   Enid Baas, MD  metoprolol tartrate (LOPRESSOR) 25 MG tablet Take 1 tablet (25 mg total) by mouth 2 (two) times daily. 11/25/17   Enid Baas, MD  pantoprazole (PROTONIX) 40 MG tablet Take 1 tablet (40 mg total) by mouth 2 (two) times daily. 10/22/17   Pasty Spillers, MD  predniSONE (DELTASONE) 10 MG tablet Take 1 tablet (10 mg total) by mouth 3 (three) times daily. 11/28/17   Enid Baas, MD  predniSONE (STERAPRED UNI-PAK 21 TAB) 10 MG (21) TBPK tablet 6 tabs PO x 1 day 5 tabs PO x 1 day 4 tabs PO x 1 day 3 tabs PO x 1 day 2 tabs PO x 1 day 1 tab PO x 1 day and stop 11/25/17   Enid Baas, MD  rosuvastatin (CRESTOR) 10 MG tablet Take 10 mg by mouth daily.  10/10/17   [provider]  tamsulosin (FLOMAX) 0.4 MG CAPS capsule Take 1 capsule (0.4 mg total) by mouth daily. 11/26/17   Enid Baas, MD    Allergies Penicillins and Codeine  Family History  Problem Relation Age of Onset  . COPD Mother   . Heart attack Mother   . Stroke Father   . COPD Father     Social History Social History   Tobacco Use  . Smoking status: Current Every Day Smoker    Packs/day: 1.00    Years: 30.00    Pack years: 30.00    Types: Cigarettes  . Smokeless tobacco: Never Used  . Tobacco comment: quit some time in 2012  Substance Use Topics  . Alcohol use: No    Alcohol/week: 0.0 oz    Comment: rare consumption  . Drug use: No    Review of Systems  Constitutional: Negative for fever. Cardiovascular: Negative for chest pain. Respiratory: Negative for shortness of breath. Skin: Positive for foreign body, right arm. Neurological: Negative for headaches, focal weakness or numbness. ____________________________________________  PHYSICAL EXAM:  VITAL SIGNS: ED Triage Vitals  Enc  Vitals Group     BP 12/27/17 1613 130/72     Pulse Rate 12/27/17 1613 92     Resp 12/27/17 1613 20     Temp 12/27/17 1613 99 F (37.2 C)     Temp Source 12/27/17 1613 Oral     SpO2 12/27/17 1613 98 %     Weight --      Height --      Head Circumference --      Peak Flow --      Pain Score 12/27/17 1610 6     Pain Loc --      Pain Edu? --      Excl. in GC? --     Constitutional: Alert and oriented. Well appearing and in no distress. Cardiovascular: Normal rate, regular rhythm. Radial pulses 2+ bilaterally. Respiratory: Normal respiratory effort. No wheezes/rales/rhonchi. Neurologic:  Sensation intact to RUE. Skin:  Fish hook is through and through  right forearm. No active bleeding, redness or drainage noted. ____________________________________________  PROCEDURES  .Foreign Body Removal Date/Time: 12/27/2017 5:26 PM Performed by: Lorre Munroe, NP Authorized by: Lorre Munroe, NP  Consent: Verbal consent obtained. Consent given by: patient Patient identity confirmed: verbally with patient Body area: skin General location: upper extremity Location details: right forearm  Sedation: Patient sedated: no  Patient restrained: no Localization method: visualized Removal mechanism: ring cutter. Dressing: antibiotic ointment and dressing applied Tendon involvement: none Depth: subcutaneous Complexity: simple 1 objects recovered. Objects recovered: fish hook Post-procedure assessment: foreign body removed Patient tolerance: Patient tolerated the procedure well with no immediate complications    ____________________________________________  INITIAL IMPRESSION / ASSESSMENT AND PLAN / ED COURSE  52 yo female with foreign body to right forearm. Tdap given. Fish hook removed- see procedure note. No indication for prophylactic antibiotics. Follow up with PCP as needed for pain, swelling, redness or drainage. Return precautions  discussed. ____________________________________________  FINAL CLINICAL IMPRESSION(S) / ED DIAGNOSES  Final diagnoses:  Foreign body (FB) in soft tissue      Lorre Munroe, NP 12/27/17 1729    Merrily Brittle, MD 12/27/17 [phone]

## 2017-12-27 NOTE — Discharge Instructions (Addendum)
You were given a Tdap today. We removed the fish hook from your skin. Take the dressing off in 1 hour. Wash with soap and warm water daily. Cover with Neosporin and Bandaid until healed.

## 2017-12-29 ENCOUNTER — Other Ambulatory Visit: Payer: Self-pay

## 2017-12-30 DIAGNOSIS — I119 Hypertensive heart disease without heart failure: Secondary | ICD-10-CM | POA: Diagnosis not present

## 2017-12-30 DIAGNOSIS — I43 Cardiomyopathy in diseases classified elsewhere: Secondary | ICD-10-CM | POA: Diagnosis not present

## 2017-12-30 DIAGNOSIS — J439 Emphysema, unspecified: Secondary | ICD-10-CM | POA: Diagnosis not present

## 2017-12-30 DIAGNOSIS — J449 Chronic obstructive pulmonary disease, unspecified: Secondary | ICD-10-CM | POA: Diagnosis not present

## 2018-01-01 ENCOUNTER — Other Ambulatory Visit: Payer: Self-pay

## 2018-01-01 NOTE — Patient Outreach (Signed)
[facility] Care Management  01/01/2018  Caitlyn Evans [birth year] [id number]   Medication Adherence call to [name] patient is showing past due under [facility]on Rosuvastatin  10 mg spoke with patient she said she has been in the hospital for the past month and did not know if the doctor was going to keep her on this medication,she ask if we can contact doctor to see if they still want her on rosuvastatin.left a message at doctor's office.   Lillia AbedAna Ollison-Moran CPhT Pharmacy Technician [facility] Care Management Direct Dial [phone]  Fax [phone] [email or web address]

## 2018-01-17 DIAGNOSIS — J441 Chronic obstructive pulmonary disease with (acute) exacerbation: Secondary | ICD-10-CM | POA: Diagnosis not present

## 2018-01-21 ENCOUNTER — Ambulatory Visit: Payer: Medicare Other | Admitting: Gastroenterology

## 2018-02-13 DIAGNOSIS — J9610 Chronic respiratory failure, unspecified whether with hypoxia or hypercapnia: Secondary | ICD-10-CM | POA: Diagnosis not present

## 2018-02-13 DIAGNOSIS — J180 Bronchopneumonia, unspecified organism: Secondary | ICD-10-CM | POA: Diagnosis not present

## 2018-02-13 DIAGNOSIS — K2900 Acute gastritis without bleeding: Secondary | ICD-10-CM | POA: Diagnosis not present

## 2018-02-17 DIAGNOSIS — J441 Chronic obstructive pulmonary disease with (acute) exacerbation: Secondary | ICD-10-CM | POA: Diagnosis not present

## 2018-03-19 DIAGNOSIS — J441 Chronic obstructive pulmonary disease with (acute) exacerbation: Secondary | ICD-10-CM | POA: Diagnosis not present

## 2018-03-23 ENCOUNTER — Other Ambulatory Visit: Payer: Self-pay | Admitting: Pharmacist

## 2018-03-23 NOTE — Patient Outreach (Signed)
[facility] Care Management  03/23/2018  [name] 22-Apr-1966 [id number]   Incoming call from [name] in response to the [facility] Medication Adherence Campaign. Speak with patient. HIPAA identifiers verified and verbal consent received.  Mrs. Caitlyn Evans reports that she takes her rosuvastatin once daily as directed. Reports that she rarely misses a dose and denies any barriers to adherence. Reports that she has mild dementia and for this reason has developed a consistent routine for taking her medication every day to make sure that she does not forget. Reports that her husband also helps to make sure that she does not forget.   Counsel patient about the importance of medication adherence. Counsel about the benefit of using a weekly pillbox as and additional adherence tool. Mrs. Caitlyn Evans reports that she does have one, does not currently use it, but will consider using it in the future. Counsel patient about the cost savings of using mail order pharmacy. Ms. Caitlyn Evans reports that she does not use mail order because she has to get her alprazolam from a local pharmacy and does not wish to use two pharmacies. Reports that she does use the [employer] over the counter benefit.  Reports that she just [facility]ies [facility] to [facility] (Epic record updated accordingly) because [facility] closed.  Mrs. Caitlyn Evans denies any further medication questions/concerns at this time. Provide the patient with my phone number.  Will close pharmacy episode at this time.  Duanne MoronElisabeth Jakye Mullens, PharmD, [facility] Clinical Pharmacist [facility] Care Management [phone]

## 2018-04-03 ENCOUNTER — Other Ambulatory Visit: Payer: Self-pay | Admitting: Gastroenterology

## 2018-04-19 DIAGNOSIS — J441 Chronic obstructive pulmonary disease with (acute) exacerbation: Secondary | ICD-10-CM | POA: Diagnosis not present

## 2018-04-25 ENCOUNTER — Emergency Department: Payer: Medicare Other

## 2018-04-25 ENCOUNTER — Other Ambulatory Visit: Payer: Self-pay

## 2018-04-25 ENCOUNTER — Emergency Department
Admission: EM | Admit: 2018-04-25 | Discharge: 2018-04-25 | Disposition: A | Discharge status: Home / Self Care | Payer: Medicare Other | Attending: Emergency Medicine | Admitting: Emergency Medicine

## 2018-04-25 ENCOUNTER — Encounter: Payer: Self-pay | Admitting: Emergency Medicine

## 2018-04-25 DIAGNOSIS — J449 Chronic obstructive pulmonary disease, unspecified: Secondary | ICD-10-CM | POA: Diagnosis not present

## 2018-04-25 DIAGNOSIS — Y999 Unspecified external cause status: Secondary | ICD-10-CM | POA: Insufficient documentation

## 2018-04-25 DIAGNOSIS — S2220XA Unspecified fracture of sternum, initial encounter for closed fracture: Secondary | ICD-10-CM | POA: Insufficient documentation

## 2018-04-25 DIAGNOSIS — S299XXA Unspecified injury of thorax, initial encounter: Secondary | ICD-10-CM | POA: Diagnosis present

## 2018-04-25 DIAGNOSIS — I509 Heart failure, unspecified: Secondary | ICD-10-CM | POA: Diagnosis not present

## 2018-04-25 DIAGNOSIS — Y9389 Activity, other specified: Secondary | ICD-10-CM | POA: Insufficient documentation

## 2018-04-25 DIAGNOSIS — Z87891 Personal history of nicotine dependence: Secondary | ICD-10-CM | POA: Diagnosis not present

## 2018-04-25 DIAGNOSIS — R079 Chest pain, unspecified: Secondary | ICD-10-CM | POA: Diagnosis not present

## 2018-04-25 DIAGNOSIS — I110 Hypertensive heart disease with heart failure: Secondary | ICD-10-CM | POA: Insufficient documentation

## 2018-04-25 DIAGNOSIS — Y92410 Unspecified street and highway as the place of occurrence of the external cause: Secondary | ICD-10-CM | POA: Insufficient documentation

## 2018-04-25 DIAGNOSIS — Z79899 Other long term (current) drug therapy: Secondary | ICD-10-CM | POA: Diagnosis not present

## 2018-04-25 DIAGNOSIS — M542 Cervicalgia: Secondary | ICD-10-CM | POA: Diagnosis not present

## 2018-04-25 DIAGNOSIS — J439 Emphysema, unspecified: Secondary | ICD-10-CM | POA: Diagnosis not present

## 2018-04-25 LAB — BASIC METABOLIC PANEL
Anion gap: 9 (ref 5–15)
BUN: 11 mg/dL (ref 6–20)
CALCIUM: 8.9 mg/dL (ref 8.9–10.3)
CO2: 25 mmol/L (ref 22–32)
CREATININE: 0.66 mg/dL (ref 0.44–1.00)
Chloride: 109 mmol/L (ref 98–111)
GFR calc non Af Amer: >60 mL/min (ref >60)
GLUCOSE: 132 mg/dL — AB (ref 70–99)
Potassium: 4 mmol/L (ref 3.5–5.1)
Sodium: 143 mmol/L (ref 135–145)

## 2018-04-25 LAB — CBC
HEMATOCRIT: 38.1 % (ref 35.0–47.0)
Hemoglobin: 12.5 g/dL (ref 12.0–16.0)
MCH: 28 pg (ref 26.0–34.0)
MCHC: 33 g/dL (ref 32.0–36.0)
MCV: 85 fL (ref 80.0–100.0)
Platelets: 303 10*3/uL (ref 150–440)
RBC: 4.48 MIL/uL (ref 3.80–5.20)
RDW: 17 % — AB (ref 11.5–14.5)
WBC: 8.4 10*3/uL (ref 3.6–11.0)

## 2018-04-25 MED ORDER — IOHEXOL 300 MG/ML  SOLN
75.0000 mL | Freq: Once | INTRAMUSCULAR | Status: AC | PRN
  Administered 2018-04-25: 75 mL via INTRAVENOUS
  Filled 2018-04-25: qty 75

## 2018-04-25 MED ORDER — OXYCODONE-ACETAMINOPHEN 5-325 MG PO TABS: 1.0000 | ORAL_TABLET | Freq: Four times a day (QID) | ORAL | 0 refills | Status: AC | PRN

## 2018-04-25 MED ORDER — TRAMADOL HCL 50 MG PO TABS
50.0000 mg | ORAL_TABLET | Freq: Once | ORAL | Status: AC
Start: 2018-04-25 — End: 2018-04-25
  Administered 2018-04-25: 50 mg via ORAL
  Filled 2018-04-25: qty 1

## 2018-04-25 MED ORDER — OXYCODONE-ACETAMINOPHEN 5-325 MG PO TABS
1.0000 | ORAL_TABLET | Freq: Once | ORAL | Status: AC
Start: 2018-04-25 — End: 2018-04-25
  Administered 2018-04-25: 1 via ORAL
  Filled 2018-04-25: qty 1

## 2018-04-25 NOTE — ED Notes (Signed)
Patient declined repeat of vital signs.
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
## 2018-04-27 DIAGNOSIS — J439 Emphysema, unspecified: Secondary | ICD-10-CM | POA: Diagnosis not present

## 2018-04-27 DIAGNOSIS — J9610 Chronic respiratory failure, unspecified whether with hypoxia or hypercapnia: Secondary | ICD-10-CM | POA: Diagnosis not present

## 2018-04-27 DIAGNOSIS — R042 Hemoptysis: Secondary | ICD-10-CM | POA: Diagnosis not present

## 2018-04-27 DIAGNOSIS — Z9189 Other specified personal risk factors, not elsewhere classified: Secondary | ICD-10-CM | POA: Diagnosis not present

## 2018-04-27 DIAGNOSIS — G43919 Migraine, unspecified, intractable, without status migrainosus: Secondary | ICD-10-CM | POA: Diagnosis not present

## 2018-04-27 DIAGNOSIS — J9611 Chronic respiratory failure with hypoxia: Secondary | ICD-10-CM | POA: Diagnosis not present

## 2018-05-01 DIAGNOSIS — J439 Emphysema, unspecified: Secondary | ICD-10-CM | POA: Diagnosis not present

## 2018-05-01 DIAGNOSIS — S2220XA Unspecified fracture of sternum, initial encounter for closed fracture: Secondary | ICD-10-CM | POA: Diagnosis not present

## 2018-05-01 DIAGNOSIS — I43 Cardiomyopathy in diseases classified elsewhere: Secondary | ICD-10-CM | POA: Diagnosis not present

## 2018-05-01 DIAGNOSIS — I119 Hypertensive heart disease without heart failure: Secondary | ICD-10-CM | POA: Diagnosis not present

## 2018-05-03 DIAGNOSIS — R0789 Other chest pain: Secondary | ICD-10-CM | POA: Diagnosis not present

## 2018-05-03 DIAGNOSIS — R600 Localized edema: Secondary | ICD-10-CM | POA: Diagnosis not present

## 2018-05-03 DIAGNOSIS — R112 Nausea with vomiting, unspecified: Secondary | ICD-10-CM | POA: Diagnosis not present

## 2018-05-03 DIAGNOSIS — S2220XA Unspecified fracture of sternum, initial encounter for closed fracture: Secondary | ICD-10-CM | POA: Diagnosis not present

## 2018-05-03 DIAGNOSIS — I252 Old myocardial infarction: Secondary | ICD-10-CM | POA: Diagnosis not present

## 2018-05-03 DIAGNOSIS — D72829 Elevated white blood cell count, unspecified: Secondary | ICD-10-CM | POA: Diagnosis not present

## 2018-05-03 DIAGNOSIS — S5011XA Contusion of right forearm, initial encounter: Secondary | ICD-10-CM | POA: Diagnosis not present

## 2018-05-03 DIAGNOSIS — R51 Headache: Secondary | ICD-10-CM | POA: Diagnosis not present

## 2018-05-03 DIAGNOSIS — S5012XA Contusion of left forearm, initial encounter: Secondary | ICD-10-CM | POA: Diagnosis not present

## 2018-05-03 DIAGNOSIS — R918 Other nonspecific abnormal finding of lung field: Secondary | ICD-10-CM | POA: Diagnosis not present

## 2018-05-03 DIAGNOSIS — J449 Chronic obstructive pulmonary disease, unspecified: Secondary | ICD-10-CM | POA: Diagnosis not present

## 2018-05-03 DIAGNOSIS — R0602 Shortness of breath: Secondary | ICD-10-CM | POA: Diagnosis not present

## 2018-05-07 DIAGNOSIS — K2900 Acute gastritis without bleeding: Secondary | ICD-10-CM | POA: Diagnosis not present

## 2018-05-07 DIAGNOSIS — J9610 Chronic respiratory failure, unspecified whether with hypoxia or hypercapnia: Secondary | ICD-10-CM | POA: Diagnosis not present

## 2018-05-07 DIAGNOSIS — S2220XA Unspecified fracture of sternum, initial encounter for closed fracture: Secondary | ICD-10-CM | POA: Diagnosis not present

## 2018-05-07 DIAGNOSIS — Z880 Allergy status to penicillin: Secondary | ICD-10-CM | POA: Diagnosis not present

## 2018-05-20 DIAGNOSIS — J441 Chronic obstructive pulmonary disease with (acute) exacerbation: Secondary | ICD-10-CM | POA: Diagnosis not present

## 2018-06-15 DIAGNOSIS — I2510 Atherosclerotic heart disease of native coronary artery without angina pectoris: Secondary | ICD-10-CM | POA: Diagnosis not present

## 2018-06-15 DIAGNOSIS — I43 Cardiomyopathy in diseases classified elsewhere: Secondary | ICD-10-CM | POA: Diagnosis not present

## 2018-06-15 DIAGNOSIS — I119 Hypertensive heart disease without heart failure: Secondary | ICD-10-CM | POA: Diagnosis not present

## 2018-06-15 DIAGNOSIS — I2584 Coronary atherosclerosis due to calcified coronary lesion: Secondary | ICD-10-CM | POA: Diagnosis not present

## 2018-06-15 DIAGNOSIS — Z23 Encounter for immunization: Secondary | ICD-10-CM | POA: Diagnosis not present

## 2018-06-18 DIAGNOSIS — J9611 Chronic respiratory failure with hypoxia: Secondary | ICD-10-CM | POA: Diagnosis not present

## 2018-06-18 DIAGNOSIS — R0602 Shortness of breath: Secondary | ICD-10-CM | POA: Diagnosis not present

## 2018-06-18 DIAGNOSIS — I43 Cardiomyopathy in diseases classified elsewhere: Secondary | ICD-10-CM | POA: Diagnosis not present

## 2018-06-18 DIAGNOSIS — Z9189 Other specified personal risk factors, not elsewhere classified: Secondary | ICD-10-CM | POA: Diagnosis not present

## 2018-06-18 DIAGNOSIS — I119 Hypertensive heart disease without heart failure: Secondary | ICD-10-CM | POA: Diagnosis not present

## 2018-06-19 DIAGNOSIS — R079 Chest pain, unspecified: Secondary | ICD-10-CM | POA: Diagnosis not present

## 2018-06-19 DIAGNOSIS — K222 Esophageal obstruction: Secondary | ICD-10-CM | POA: Diagnosis not present

## 2018-06-19 DIAGNOSIS — J9611 Chronic respiratory failure with hypoxia: Secondary | ICD-10-CM | POA: Diagnosis not present

## 2018-06-19 DIAGNOSIS — J441 Chronic obstructive pulmonary disease with (acute) exacerbation: Secondary | ICD-10-CM | POA: Diagnosis not present

## 2018-06-19 DIAGNOSIS — J449 Chronic obstructive pulmonary disease, unspecified: Secondary | ICD-10-CM | POA: Diagnosis not present

## 2018-06-19 DIAGNOSIS — G43919 Migraine, unspecified, intractable, without status migrainosus: Secondary | ICD-10-CM | POA: Diagnosis not present

## 2018-07-20 DIAGNOSIS — J441 Chronic obstructive pulmonary disease with (acute) exacerbation: Secondary | ICD-10-CM | POA: Diagnosis not present

## 2018-07-24 ENCOUNTER — Other Ambulatory Visit: Payer: Self-pay | Admitting: Internal Medicine

## 2018-07-24 DIAGNOSIS — M8580 Other specified disorders of bone density and structure, unspecified site: Secondary | ICD-10-CM

## 2018-08-16 DIAGNOSIS — J9692 Respiratory failure, unspecified with hypercapnia: Secondary | ICD-10-CM

## 2018-08-16 HISTORY — DX: Respiratory failure, unspecified with hypercapnia: J96.92

## 2018-08-17 DIAGNOSIS — J449 Chronic obstructive pulmonary disease, unspecified: Secondary | ICD-10-CM | POA: Diagnosis not present

## 2018-08-17 DIAGNOSIS — I119 Hypertensive heart disease without heart failure: Secondary | ICD-10-CM | POA: Diagnosis not present

## 2018-08-17 DIAGNOSIS — G43919 Migraine, unspecified, intractable, without status migrainosus: Secondary | ICD-10-CM | POA: Diagnosis not present

## 2018-08-17 DIAGNOSIS — J9611 Chronic respiratory failure with hypoxia: Secondary | ICD-10-CM | POA: Diagnosis not present

## 2018-08-19 DIAGNOSIS — J441 Chronic obstructive pulmonary disease with (acute) exacerbation: Secondary | ICD-10-CM | POA: Diagnosis not present

## 2018-08-31 ENCOUNTER — Encounter: Payer: Self-pay | Admitting: Emergency Medicine

## 2018-08-31 ENCOUNTER — Other Ambulatory Visit: Payer: Self-pay

## 2018-08-31 ENCOUNTER — Inpatient Hospital Stay
Admission: EM | Admit: 2018-08-31 | Discharge: 2018-09-02 | DRG: 193 | Disposition: A | Discharge status: Home / Self Care | Payer: Medicare Other | Attending: Internal Medicine | Admitting: Internal Medicine

## 2018-08-31 ENCOUNTER — Emergency Department: Payer: Medicare Other

## 2018-08-31 DIAGNOSIS — I509 Heart failure, unspecified: Secondary | ICD-10-CM | POA: Diagnosis not present

## 2018-08-31 DIAGNOSIS — Z7951 Long term (current) use of inhaled steroids: Secondary | ICD-10-CM | POA: Diagnosis not present

## 2018-08-31 DIAGNOSIS — Z880 Allergy status to penicillin: Secondary | ICD-10-CM

## 2018-08-31 DIAGNOSIS — J40 Bronchitis, not specified as acute or chronic: Secondary | ICD-10-CM | POA: Diagnosis not present

## 2018-08-31 DIAGNOSIS — Z789 Other specified health status: Secondary | ICD-10-CM

## 2018-08-31 DIAGNOSIS — I110 Hypertensive heart disease with heart failure: Secondary | ICD-10-CM | POA: Diagnosis not present

## 2018-08-31 DIAGNOSIS — J440 Chronic obstructive pulmonary disease with acute lower respiratory infection: Secondary | ICD-10-CM | POA: Diagnosis not present

## 2018-08-31 DIAGNOSIS — J302 Other seasonal allergic rhinitis: Secondary | ICD-10-CM | POA: Diagnosis present

## 2018-08-31 DIAGNOSIS — J189 Pneumonia, unspecified organism: Principal | ICD-10-CM | POA: Diagnosis present

## 2018-08-31 DIAGNOSIS — Z9981 Dependence on supplemental oxygen: Secondary | ICD-10-CM | POA: Diagnosis not present

## 2018-08-31 DIAGNOSIS — Z902 Acquired absence of lung [part of]: Secondary | ICD-10-CM

## 2018-08-31 DIAGNOSIS — Z79899 Other long term (current) drug therapy: Secondary | ICD-10-CM

## 2018-08-31 DIAGNOSIS — Z7952 Long term (current) use of systemic steroids: Secondary | ICD-10-CM

## 2018-08-31 DIAGNOSIS — Z885 Allergy status to narcotic agent status: Secondary | ICD-10-CM

## 2018-08-31 DIAGNOSIS — Z823 Family history of stroke: Secondary | ICD-10-CM | POA: Diagnosis not present

## 2018-08-31 DIAGNOSIS — I10 Essential (primary) hypertension: Secondary | ICD-10-CM | POA: Diagnosis not present

## 2018-08-31 DIAGNOSIS — I5032 Chronic diastolic (congestive) heart failure: Secondary | ICD-10-CM | POA: Diagnosis present

## 2018-08-31 DIAGNOSIS — J9601 Acute respiratory failure with hypoxia: Secondary | ICD-10-CM | POA: Diagnosis not present

## 2018-08-31 DIAGNOSIS — J9611 Chronic respiratory failure with hypoxia: Secondary | ICD-10-CM | POA: Diagnosis not present

## 2018-08-31 DIAGNOSIS — F410 Panic disorder [episodic paroxysmal anxiety] without agoraphobia: Secondary | ICD-10-CM | POA: Diagnosis present

## 2018-08-31 DIAGNOSIS — Z87891 Personal history of nicotine dependence: Secondary | ICD-10-CM

## 2018-08-31 DIAGNOSIS — Z825 Family history of asthma and other chronic lower respiratory diseases: Secondary | ICD-10-CM

## 2018-08-31 DIAGNOSIS — E7800 Pure hypercholesterolemia, unspecified: Secondary | ICD-10-CM | POA: Diagnosis not present

## 2018-08-31 DIAGNOSIS — I252 Old myocardial infarction: Secondary | ICD-10-CM

## 2018-08-31 DIAGNOSIS — Z539 Procedure and treatment not carried out, unspecified reason: Secondary | ICD-10-CM | POA: Diagnosis not present

## 2018-08-31 DIAGNOSIS — Y95 Nosocomial condition: Secondary | ICD-10-CM | POA: Diagnosis present

## 2018-08-31 DIAGNOSIS — J8489 Other specified interstitial pulmonary diseases: Secondary | ICD-10-CM | POA: Diagnosis present

## 2018-08-31 DIAGNOSIS — Z8249 Family history of ischemic heart disease and other diseases of the circulatory system: Secondary | ICD-10-CM

## 2018-08-31 DIAGNOSIS — J441 Chronic obstructive pulmonary disease with (acute) exacerbation: Secondary | ICD-10-CM | POA: Diagnosis present

## 2018-08-31 DIAGNOSIS — E785 Hyperlipidemia, unspecified: Secondary | ICD-10-CM | POA: Diagnosis not present

## 2018-08-31 DIAGNOSIS — J9621 Acute and chronic respiratory failure with hypoxia: Secondary | ICD-10-CM | POA: Diagnosis present

## 2018-08-31 DIAGNOSIS — Z90722 Acquired absence of ovaries, bilateral: Secondary | ICD-10-CM | POA: Diagnosis not present

## 2018-08-31 DIAGNOSIS — J9622 Acute and chronic respiratory failure with hypercapnia: Secondary | ICD-10-CM | POA: Diagnosis present

## 2018-08-31 DIAGNOSIS — Z90710 Acquired absence of both cervix and uterus: Secondary | ICD-10-CM

## 2018-08-31 DIAGNOSIS — R0603 Acute respiratory distress: Secondary | ICD-10-CM | POA: Diagnosis not present

## 2018-08-31 DIAGNOSIS — G43919 Migraine, unspecified, intractable, without status migrainosus: Secondary | ICD-10-CM | POA: Diagnosis not present

## 2018-08-31 DIAGNOSIS — J439 Emphysema, unspecified: Secondary | ICD-10-CM | POA: Diagnosis not present

## 2018-08-31 LAB — CBC
HCT: 43.2 % (ref 36.0–46.0)
HEMOGLOBIN: 13.3 g/dL (ref 12.0–15.0)
MCH: 27 pg (ref 26.0–34.0)
MCHC: 30.8 g/dL (ref 30.0–36.0)
MCV: 87.6 fL (ref 80.0–100.0)
PLATELETS: 330 10*3/uL (ref 150–400)
RBC: 4.93 MIL/uL (ref 3.87–5.11)
RDW: 16.4 % — AB (ref 11.5–15.5)
WBC: 20.1 10*3/uL — AB (ref 4.0–10.5)
nRBC: 0 % (ref 0.0–0.2)

## 2018-08-31 LAB — MRSA PCR SCREENING: MRSA by PCR: NEGATIVE

## 2018-08-31 LAB — BLOOD GAS, VENOUS
ACID-BASE EXCESS: 5.6 mmol/L — AB (ref 0.0–2.0)
Bicarbonate: 30.6 mmol/L — ABNORMAL HIGH (ref 20.0–28.0)
O2 Saturation: 86.5 %
PCO2 VEN: 45 mmHg (ref 44.0–60.0)
PH VEN: 7.44 — AB (ref 7.250–7.430)
Patient temperature: 37
pO2, Ven: 50 mmHg — ABNORMAL HIGH (ref 32.0–45.0)

## 2018-08-31 LAB — BASIC METABOLIC PANEL
Anion gap: 11 (ref 5–15)
BUN: 19 mg/dL (ref 6–20)
CALCIUM: 9 mg/dL (ref 8.9–10.3)
CO2: 28 mmol/L (ref 22–32)
Chloride: 100 mmol/L (ref 98–111)
Creatinine, Ser: 0.72 mg/dL (ref 0.44–1.00)
GFR calc Af Amer: >60 mL/min (ref >60)
Glucose, Bld: 105 mg/dL — ABNORMAL HIGH (ref 70–99)
POTASSIUM: 4.3 mmol/L (ref 3.5–5.1)
SODIUM: 139 mmol/L (ref 135–145)

## 2018-08-31 LAB — INFLUENZA PANEL BY PCR (TYPE A & B)
Influenza A By PCR: NEGATIVE
Influenza B By PCR: NEGATIVE

## 2018-08-31 LAB — PROCALCITONIN

## 2018-08-31 MED ORDER — LORAZEPAM 2 MG/ML IJ SOLN
1.0000 mg | Freq: Once | INTRAMUSCULAR | Status: AC
  Administered 2018-08-31: 1 mg via INTRAVENOUS
  Filled 2018-08-31: qty 1

## 2018-08-31 MED ORDER — ALBUTEROL SULFATE (2.5 MG/3ML) 0.083% IN NEBU
5.0000 mg | INHALATION_SOLUTION | Freq: Once | RESPIRATORY_TRACT | Status: DC
  Filled 2018-08-31: qty 6

## 2018-08-31 MED ORDER — PANTOPRAZOLE SODIUM 40 MG PO TBEC
40.0000 mg | DELAYED_RELEASE_TABLET | Freq: Two times a day (BID) | ORAL | Status: DC
  Administered 2018-09-01 – 2018-09-02 (×3): 40 mg via ORAL
  Filled 2018-08-31 (×3): qty 1

## 2018-08-31 MED ORDER — VANCOMYCIN HCL IN DEXTROSE 750-5 MG/150ML-% IV SOLN
750.0000 mg | INTRAVENOUS | Status: DC
  Filled 2018-08-31: qty 150

## 2018-08-31 MED ORDER — ALPRAZOLAM 0.5 MG PO TABS: 0.5000 mg | ORAL_TABLET | Freq: Three times a day (TID) | ORAL | Status: DC

## 2018-08-31 MED ORDER — IPRATROPIUM-ALBUTEROL 0.5-2.5 (3) MG/3ML IN SOLN
3.0000 mL | Freq: Four times a day (QID) | RESPIRATORY_TRACT | Status: DC
  Administered 2018-08-31 – 2018-09-02 (×8): 3 mL via RESPIRATORY_TRACT
  Filled 2018-08-31 (×6): qty 3

## 2018-08-31 MED ORDER — SODIUM CHLORIDE 0.9% FLUSH: 3.0000 mL | INTRAVENOUS | Status: DC | PRN

## 2018-08-31 MED ORDER — METHYLPREDNISOLONE SODIUM SUCC 125 MG IJ SOLR: 60.0000 mg | Freq: Four times a day (QID) | INTRAMUSCULAR | Status: DC

## 2018-08-31 MED ORDER — METOPROLOL TARTRATE 25 MG PO TABS
25.0000 mg | ORAL_TABLET | Freq: Two times a day (BID) | ORAL | Status: DC
  Administered 2018-08-31 – 2018-09-02 (×3): 25 mg via ORAL
  Filled 2018-08-31 (×3): qty 1

## 2018-08-31 MED ORDER — SODIUM CHLORIDE 0.9% FLUSH
3.0000 mL | Freq: Two times a day (BID) | INTRAVENOUS | Status: DC
  Administered 2018-08-31 – 2018-09-02 (×5): 3 mL via INTRAVENOUS

## 2018-08-31 MED ORDER — VANCOMYCIN HCL IN DEXTROSE 1-5 GM/200ML-% IV SOLN
1000.0000 mg | Freq: Once | INTRAVENOUS | Status: AC
  Administered 2018-08-31: 1000 mg via INTRAVENOUS
  Filled 2018-08-31: qty 200

## 2018-08-31 MED ORDER — ONDANSETRON HCL 4 MG/2ML IJ SOLN: 4.0000 mg | Freq: Four times a day (QID) | INTRAMUSCULAR | Status: DC | PRN

## 2018-08-31 MED ORDER — ACETAMINOPHEN 325 MG PO TABS
650.0000 mg | ORAL_TABLET | Freq: Four times a day (QID) | ORAL | Status: DC | PRN
  Administered 2018-09-02: 07:00:00 650 mg via ORAL
  Filled 2018-08-31: qty 2

## 2018-08-31 MED ORDER — METHYLPREDNISOLONE SODIUM SUCC 40 MG IJ SOLR
40.0000 mg | Freq: Four times a day (QID) | INTRAMUSCULAR | Status: DC
  Administered 2018-08-31 – 2018-09-02 (×8): 40 mg via INTRAVENOUS
  Filled 2018-08-31 (×7): qty 1

## 2018-08-31 MED ORDER — ENOXAPARIN SODIUM 40 MG/0.4ML ~~LOC~~ SOLN
40.0000 mg | SUBCUTANEOUS | Status: DC
  Administered 2018-09-01: 40 mg via SUBCUTANEOUS
  Filled 2018-08-31 (×2): qty 0.4

## 2018-08-31 MED ORDER — TAMSULOSIN HCL 0.4 MG PO CAPS
0.4000 mg | ORAL_CAPSULE | Freq: Every day | ORAL | Status: DC
  Filled 2018-08-31: qty 1

## 2018-08-31 MED ORDER — FUROSEMIDE 20 MG PO TABS
20.0000 mg | ORAL_TABLET | Freq: Every day | ORAL | Status: DC
  Administered 2018-09-01 – 2018-09-02 (×2): 20 mg via ORAL
  Filled 2018-08-31 (×2): qty 1

## 2018-08-31 MED ORDER — DONEPEZIL HCL 5 MG PO TABS
10.0000 mg | ORAL_TABLET | Freq: Every day | ORAL | Status: DC
  Administered 2018-09-01 – 2018-09-02 (×2): 10 mg via ORAL
  Filled 2018-08-31 (×2): qty 2

## 2018-08-31 MED ORDER — IPRATROPIUM-ALBUTEROL 0.5-2.5 (3) MG/3ML IN SOLN
3.0000 mL | Freq: Once | RESPIRATORY_TRACT | Status: AC
  Administered 2018-08-31: 3 mL via RESPIRATORY_TRACT
  Filled 2018-08-31: qty 3

## 2018-08-31 MED ORDER — SENNOSIDES-DOCUSATE SODIUM 8.6-50 MG PO TABS: 1.0000 | ORAL_TABLET | Freq: Every evening | ORAL | Status: DC | PRN

## 2018-08-31 MED ORDER — SODIUM CHLORIDE 0.9 % IV SOLN
2.0000 g | Freq: Three times a day (TID) | INTRAVENOUS | Status: DC
  Filled 2018-08-31 (×3): qty 2

## 2018-08-31 MED ORDER — SODIUM CHLORIDE 0.9 % IV SOLN: 250.0000 mL | INTRAVENOUS | Status: DC | PRN

## 2018-08-31 MED ORDER — DILTIAZEM HCL ER COATED BEADS 180 MG PO CP24
180.0000 mg | ORAL_CAPSULE | Freq: Every day | ORAL | Status: DC
  Administered 2018-09-02: 09:00:00 180 mg via ORAL
  Filled 2018-08-31 (×2): qty 1

## 2018-08-31 MED ORDER — LORAZEPAM 2 MG/ML IJ SOLN
1.0000 mg | INTRAMUSCULAR | Status: DC | PRN
  Administered 2018-08-31 (×2): 1 mg via INTRAVENOUS
  Filled 2018-08-31 (×2): qty 1

## 2018-08-31 MED ORDER — ONDANSETRON HCL 4 MG PO TABS: 4.0000 mg | ORAL_TABLET | Freq: Four times a day (QID) | ORAL | Status: DC | PRN

## 2018-08-31 MED ORDER — ROSUVASTATIN CALCIUM 10 MG PO TABS
10.0000 mg | ORAL_TABLET | Freq: Every day | ORAL | Status: DC
  Administered 2018-09-01 – 2018-09-02 (×2): 10 mg via ORAL
  Filled 2018-08-31 (×2): qty 1

## 2018-08-31 MED ORDER — ALBUTEROL SULFATE (2.5 MG/3ML) 0.083% IN NEBU
5.0000 mg | INHALATION_SOLUTION | Freq: Once | RESPIRATORY_TRACT | Status: AC
  Administered 2018-08-31: 5 mg via RESPIRATORY_TRACT
  Filled 2018-08-31: qty 6

## 2018-08-31 MED ORDER — ACETAMINOPHEN 650 MG RE SUPP: 650.0000 mg | Freq: Four times a day (QID) | RECTAL | Status: DC | PRN

## 2018-08-31 MED ORDER — MAGNESIUM SULFATE 2 GM/50ML IV SOLN
2.0000 g | INTRAVENOUS | Status: AC
  Administered 2018-08-31: 2 g via INTRAVENOUS
  Filled 2018-08-31: qty 50

## 2018-08-31 MED ORDER — SODIUM CHLORIDE 0.9 % IV SOLN
2.0000 g | Freq: Once | INTRAVENOUS | Status: AC
  Administered 2018-08-31: 2 g via INTRAVENOUS
  Filled 2018-08-31: qty 2

## 2018-08-31 NOTE — ED Notes (Addendum)
Attempted to call report, RN unavailable at this time.

## 2018-08-31 NOTE — ED Triage Notes (Signed)
Sent from dr office for short of breath.  Says they gave her a steroid shot in office and she has done 3 nebulizer treatments.  Says she was told she needs bipap.  She is alert.  Breathing labored and audible wheezing, but she is able to speak in sentences.  Ox is 96% on room air.

## 2018-08-31 NOTE — ED Notes (Signed)
RT to bedside to place patient on BiPap 

## 2018-08-31 NOTE — Consult Note (Signed)
Name: Caitlyn Evans M Stagliano MRN: [id number] DOB: 05/05/1966     CONSULTATION DATE: 08/31/2018  REFERRING MD :  pyreddy  CHIEF COMPLAINT:  SOB    HISTORY OF PRESENT ILLNESS:  52 yo female with end stage COPD with multiple admission for recurrent bouts of COPD exacerbation, several intubations the past  She has h/o CHF, BOOP, COPD On chronic oxygen therapy  Was seen in ER to be hypoxic and wheezing and increased WOB Placed on biPAP, treated with IV abx, IV steroids and NEB therapy  Admitted to [facility] for further evaluation    PAST MEDICAL HISTORY :   has a past medical history of Anxiety, Asthma, BOOP (bronchiolitis obliterans with organizing pneumonia) (HCC), CHF (congestive heart failure) (HCC), Dysphagia, pharyngoesophageal phase (05/30/2015), Emphysema lung (HCC) (05/30/2015), Hypercholesterolemia, Hypertension, Lung mass, Migraines, Motion sickness, Myocardial infarction (HCC) (2012), Seasonal allergies, and Shortness of breath dyspnea.  has a past surgical history that includes Vaginal hysterectomy; Lung surgery (Left); Oophorectomy (Left); Colonoscopy with propofol (N/A, 06/02/2015); Esophagogastroduodenoscopy (egd) with propofol (N/A, 06/02/2015); and Esophagogastroduodenoscopy (egd) with propofol (N/A, 11/05/2017). Prior to Admission medications   Medication Sig Start Date End Date Taking? Authorizing Provider  ALPRAZolam Prudy Feeler(XANAX) 0.5 MG tablet Take 0.5 mg by mouth 3 (three) times daily.    Yes [provider]  diltiazem (CARDIZEM CD) 180 MG 24 hr capsule Take 1 capsule (180 mg total) by mouth daily. 11/26/17  Yes Enid BaasKalisetti, Radhika, MD  donepezil (ARICEPT) 10 MG tablet Take 10 mg by mouth daily.    Yes [provider]  furosemide (LASIX) 20 MG tablet Take 1 tablet (20 mg total) by mouth daily. 11/26/17  Yes Enid BaasKalisetti, Radhika, MD  ipratropium-albuterol (DUONEB) 0.5-2.5 (3) MG/3ML SOLN Inhale 3 mLs into the lungs every 4 (four) hours as needed (for wheezing/shortness of  breath). Patient taking differently: Inhale 3 mLs into the lungs every 4 (four) hours as needed. For wheezing/shortness of breath 05/13/16  Yes Enid BaasKalisetti, Radhika, MD  metoprolol tartrate (LOPRESSOR) 25 MG tablet Take 1 tablet (25 mg total) by mouth 2 (two) times daily. 11/25/17  Yes Enid BaasKalisetti, Radhika, MD  pantoprazole (PROTONIX) 40 MG tablet TAKE ONE TABLET BY MOUTH TWICE DAILY 04/03/18  Yes Melodie Bouillonahiliani, Varnita B, MD  predniSONE (DELTASONE) 10 MG tablet Take 1 tablet (10 mg total) by mouth 3 (three) times daily. Patient taking differently: Take 10 mg by mouth 2 (two) times daily with a meal.  11/28/17  Yes Enid BaasKalisetti, Radhika, MD  rosuvastatin (CRESTOR) 10 MG tablet Take 10 mg by mouth daily.  10/10/17  Yes [provider]  tamsulosin (FLOMAX) 0.4 MG CAPS capsule Take 1 capsule (0.4 mg total) by mouth daily. 11/26/17  Yes Enid BaasKalisetti, Radhika, MD   Allergies  Allergen Reactions  . Penicillins Hives and Other (See Comments)    Has patient had a PCN reaction causing immediate rash, facial/tongue/throat swelling, SOB or lightheadedness with hypotension: No Has patient had a PCN reaction causing severe rash involving mucus membranes or skin necrosis: No Has patient had a PCN reaction that required hospitalization No Has patient had a PCN reaction occurring within the last 10 years: No If all of the above answers are "NO", then may proceed with Cephalosporin use. Other reaction(s): Other (See Comments) Has patient had a PCN reaction causing immediate rash, facial/tongue/throat swelling, SOB or lightheadedness with hypotension: No Has patient had a PCN reaction causing severe rash involving mucus membranes or skin necrosis: No Has patient had a PCN reaction that required hospitalization No Has patient had  a PCN reaction occurring within the last 10 years: No If all of the above answers are "NO", then may proceed with Cephalosporin use.  . Codeine Hives, Nausea And Vomiting and Nausea Only     FAMILY HISTORY:  family history includes COPD in her father and mother; Heart attack in her mother; Stroke in her father. SOCIAL HISTORY:  reports that she has quit smoking. Her smoking use included cigarettes. She has a 30.00 pack-year smoking history. She has never used smokeless tobacco. She reports that she does not drink alcohol or use drugs.  REVIEW OF SYSTEMS:   Unable to obtain due to critical illness   VITAL SIGNS: Temp:  [97.5 F (36.4 C)-97.8 F (36.6 C)] 97.8 F (36.6 C) (12/16 1500) Pulse Rate:  [67-102] 95 (12/16 1500) Resp:  [19-31] 19 (12/16 1430) BP: (100-179)/(61-117) 179/117 (12/16 1500) SpO2:  [96 %-100 %] 100 % (12/16 1500) Weight:  [55.3 kg-59 kg] 55.3 kg (12/16 1500)  Physical Examination:  GENERAL:critically ill appearing, +resp distress HEAD: Normocephalic, atraumatic.  EYES: Pupils equal, round, reactive to light.  No scleral icterus.  MOUTH: Moist mucosal membrane. NECK: Supple. No JVD.  PULMONARY: +rhonchi, +wheezing CARDIOVASCULAR: S1 and S2. Regular rate and rhythm. No murmurs, rubs, or gallops.  GASTROINTESTINAL: Soft, nontender, -distended. No masses. Positive bowel sounds. No hepatosplenomegaly.  MUSCULOSKELETAL: No swelling, clubbing, or edema.  NEUROLOGIC:lethargic but arousable SKIN:intact,warm,dry    ASSESSMENT / PLAN:  SEVERE COPD EXACERBATION -continue IV steroids as prescribed -continue NEB THERAPY as prescribed -morphine as needed -wean fio2 as needed and tolerated    Severe Hypoxic and Hypercapnic Respiratory Failure -continue biPAP wean as tolerated -continue Bronchodilator Therapy -Wean Fio2    ELECTROLYTES -follow labs as needed -replace as needed -pharmacy consultation and following     DVT/GI PRX ordered TRANSFUSIONS AS NEEDED MONITOR FSBS ASSESS the need for LABS as needed   High risk for intubation, check UDS  Critical Care Time devoted to patient care services described in this note is 36  minutes.      Lucie Leather, M.D.  [facility]  Medical Director [facility] Medical Director [facility] Cardio-Pulmonary Department

## 2018-08-31 NOTE — ED Notes (Signed)
Radiology to bedside. 

## 2018-08-31 NOTE — ED Provider Notes (Signed)
[facility] Emergency Department Provider Note  ____________________________________________  Time seen: Approximately 11:56 AM  I have reviewed the triage vital signs and the nursing notes.   HISTORY  Chief Complaint Shortness of Breath  Level 5 Caveat: Portions of the History and Physical including HPI and review of systems are unable to be completely obtained due to patient being a poor historian due to acute critical illness/respiratory distress   HPI Caitlyn Evans is a 52 y.o. female with a history of emphysema complicated by Boop, CHF, hypertension sent to the ED   by her primary care doctor due to worsening shortness of breath over the past month.  She reports completing multiple rounds of antibiotics and steroids including azithromycin and Levaquin without any improvement.  She is having increased cough.  Her primary care doctor gave her steroids and 3 nebulizer treatments with the patient was still symptomatically in distress so I sent her to the ED.  Patient denied any recent travel trauma hospitalization or surgery.  Denies any history of DVT or PE.     Past Medical History:  Diagnosis Date  . Anxiety    panic attacks  . Asthma   . BOOP (bronchiolitis obliterans with organizing pneumonia) (HCC)   . CHF (congestive heart failure) (HCC)   . Dysphagia, pharyngoesophageal phase 05/30/2015  . Emphysema lung (HCC) 05/30/2015  . Hypercholesterolemia   . Hypertension   . Lung mass   . Migraines   . Motion sickness    all moving vehicles  . Myocardial infarction (HCC) 2012  . Seasonal allergies   . Shortness of breath dyspnea    1 flight-stairs     Patient Active Problem List   Diagnosis Date Noted  . Esophagitis, unspecified   . Stomach irritation   . Hiatal hernia   . COPD (chronic obstructive pulmonary disease) (HCC) 07/19/2016  . Acute on chronic respiratory failure (HCC) 05/09/2016  . Noncompliance with medication regimen 05/09/2016   . COPD with acute exacerbation (HCC) 02/05/2016  . Acute exacerbation of chronic obstructive pulmonary disease (COPD) (HCC)   . Acute respiratory failure (HCC)   . Acute respiratory failure with hypoxia (HCC) 12/05/2015  . BOOP (bronchiolitis obliterans with organizing pneumonia) (HCC) 12/05/2015  . Depression, major, single episode, moderate (HCC) 07/14/2015  . Adjustment disorder with anxiety 07/14/2015  . COPD exacerbation (HCC) 07/14/2015  . Special screening for malignant neoplasms, colon   . Swallowing difficulty   . Loss of weight   . Esophagogastric ulcer   . Feline esophagus   . Hypertension 05/30/2015  . Emphysema lung (HCC) 05/30/2015  . Dysphagia, pharyngoesophageal phase 05/30/2015     Past Surgical History:  Procedure Laterality Date  . COLONOSCOPY WITH PROPOFOL N/A 06/02/2015   Procedure: COLONOSCOPY WITH PROPOFOL;  Surgeon: Midge Minium, MD;  Location: [facility];  Service: Endoscopy;  Laterality: N/A;  . ESOPHAGOGASTRODUODENOSCOPY (EGD) WITH PROPOFOL N/A 06/02/2015   Procedure: ESOPHAGOGASTRODUODENOSCOPY (EGD) WITH PROPOFOL withdialation;  Surgeon: Midge Minium, MD;  Location: [facility];  Service: Endoscopy;  Laterality: N/A;  . ESOPHAGOGASTRODUODENOSCOPY (EGD) WITH PROPOFOL N/A 11/05/2017   Procedure: ESOPHAGOGASTRODUODENOSCOPY (EGD) WITH PROPOFOL;  Surgeon: Pasty Spillers, MD;  Location: [facility];  Service: Endoscopy;  Laterality: N/A;  . LUNG SURGERY Left    Upper lobe removed  . OOPHORECTOMY Left   . VAGINAL HYSTERECTOMY       Prior to Admission medications   Medication Sig Start Date End Date Taking? Authorizing Provider  ALPRAZolam Prudy Feeler) 0.5 MG  tablet Take 0.5 mg by mouth 3 (three) times daily.     [provider]  budesonide (PULMICORT) 0.25 MG/2ML nebulizer solution Take 2 mLs (0.25 mg total) by nebulization every 6 (six) hours. Patient not taking: Reported on 11/18/2017 07/04/17   Altamese Dilling, MD   diltiazem (CARDIZEM CD) 180 MG 24 hr capsule Take 1 capsule (180 mg total) by mouth daily. 11/26/17   Enid Baas, MD  donepezil (ARICEPT) 10 MG tablet Take 10 mg by mouth daily.     [provider]  furosemide (LASIX) 20 MG tablet Take 1 tablet (20 mg total) by mouth daily. 11/26/17   Enid Baas, MD  ipratropium-albuterol (DUONEB) 0.5-2.5 (3) MG/3ML SOLN Inhale 3 mLs into the lungs every 4 (four) hours as needed (for wheezing/shortness of breath). Patient taking differently: Inhale 3 mLs into the lungs every 4 (four) hours as needed. For wheezing/shortness of breath 05/13/16   Enid Baas, MD  metoprolol tartrate (LOPRESSOR) 25 MG tablet Take 1 tablet (25 mg total) by mouth 2 (two) times daily. 11/25/17   Enid Baas, MD  pantoprazole (PROTONIX) 40 MG tablet TAKE ONE TABLET BY MOUTH TWICE DAILY 04/03/18   Pasty Spillers, MD  predniSONE (DELTASONE) 10 MG tablet Take 1 tablet (10 mg total) by mouth 3 (three) times daily. 11/28/17   Enid Baas, MD  predniSONE (STERAPRED UNI-PAK 21 TAB) 10 MG (21) TBPK tablet 6 tabs PO x 1 day 5 tabs PO x 1 day 4 tabs PO x 1 day 3 tabs PO x 1 day 2 tabs PO x 1 day 1 tab PO x 1 day and stop 11/25/17   Enid Baas, MD  rosuvastatin (CRESTOR) 10 MG tablet Take 10 mg by mouth daily.  10/10/17   [provider]  tamsulosin (FLOMAX) 0.4 MG CAPS capsule Take 1 capsule (0.4 mg total) by mouth daily. 11/26/17   Enid Baas, MD     Allergies Penicillins and Codeine   Family History  Problem Relation Age of Onset  . COPD Mother   . Heart attack Mother   . Stroke Father   . COPD Father     Social History Social History   Tobacco Use  . Smoking status: Former Smoker    Packs/day: 1.00    Years: 30.00    Pack years: 30.00    Types: Cigarettes  . Smokeless tobacco: Never Used  . Tobacco comment: quit some time in 2012  Substance Use Topics  . Alcohol use: No    Alcohol/week: 0.0 standard  drinks    Comment: rare consumption  . Drug use: No    Review of Systems  Constitutional:   No fever or chills.  ENT:   No sore throat. No rhinorrhea. Cardiovascular:   Positive as above chest pain without syncope. Respiratory:   Positive shortness of breath and cough. Gastrointestinal:   Negative for abdominal pain, vomiting and diarrhea.  Musculoskeletal:   Negative for focal pain or swelling All other systems reviewed and are negative except as documented above in ROS and HPI.  ____________________________________________   PHYSICAL EXAM:  VITAL SIGNS: ED Triage Vitals [08/31/18 1110]  Enc Vitals Group     BP (!) 162/94     Pulse Rate (!) 102     Resp (!) 24     Temp (!) 97.5 F (36.4 C)     Temp Source Axillary     SpO2 96 %     Weight 130 lb (59 kg)  Height 4\' 10"  (1.473 m)     Head Circumference      Peak Flow      Pain Score 8     Pain Loc      Pain Edu?      Excl. in GC?     Vital signs reviewed, nursing assessments reviewed.   Constitutional:   Alert and oriented.  Ill-appearing.  Respiratory distress. Eyes:   Conjunctivae are normal. EOMI. PERRL. ENT      Head:   Normocephalic and atraumatic.      Nose:   No congestion/rhinnorhea.       Mouth/Throat:   MMM, no pharyngeal erythema. No peritonsillar mass.       Neck:   No meningismus. Full ROM. Hematological/Lymphatic/Immunilogical:   No cervical lymphadenopathy. Cardiovascular:   Tachycardia heart rate 100. Symmetric bilateral radial and DP pulses.  No murmurs. Cap refill less than 2 seconds. Respiratory:   Increased work of breathing, diffuse expiratory wheezing and prolonged expiratory phase.  Patient is tripoding.  There is symmetric air entry in all lung fields.  No focal crackles Gastrointestinal:   Soft and nontender. Non distended. There is no CVA tenderness.  No rebound, rigidity, or guarding.  Musculoskeletal:   Normal range of motion in all extremities. No joint effusions.  No lower  extremity tenderness.  No edema. Neurologic:   Normal speech and language.  Speaks only 1-2 words at a time. Motor grossly intact. No acute focal neurologic deficits are appreciated.  Skin:    Skin is warm, dry and intact. No rash noted.  No petechiae, purpura, or bullae.  ____________________________________________    LABS (pertinent positives/negatives) (all labs ordered are listed, but only abnormal results are displayed) Labs Reviewed  BASIC METABOLIC PANEL  CBC  BLOOD GAS, VENOUS   ____________________________________________   EKG    ____________________________________________    RADIOLOGY  No results found.  ____________________________________________   PROCEDURES .Critical Care Performed by: Sharman Cheek, MD Authorized by: Sharman Cheek, MD   Critical care provider statement:    Critical care time (minutes):  35   Critical care time was exclusive of:  Separately billable procedures and treating other patients   Critical care was necessary to treat or prevent imminent or life-threatening deterioration of the following conditions:  Respiratory failure   Critical care was time spent personally by me on the following activities:  Development of treatment plan with patient or surrogate, discussions with consultants, evaluation of patient's response to treatment, examination of patient, obtaining history from patient or surrogate, ordering and performing treatments and interventions, ordering and review of laboratory studies, ordering and review of radiographic studies, pulse oximetry, re-evaluation of patient's condition and review of old charts    ____________________________________________  DIFFERENTIAL DIAGNOSIS   COPD exacerbation, pneumothorax, pneumonia, pulmonary embolism  CLINICAL IMPRESSION / ASSESSMENT AND PLAN / ED COURSE  Pertinent labs & imaging results that were available during my care of the patient were reviewed by me and  considered in my medical decision making (see chart for details).    Patient presents with respiratory distress.  Exam is highly consistent with COPD exacerbation and outpatient treatment failure.  If she is not responding to further bronchodilators and respiratory treatments here in the ED, she may need a CT of her chest to rule out PE.  Doubt ACS dissection or pericarditis.  Clinical Course as of Aug 31 1242  Mon Aug 31, 2018  1216 Patient much improved on BiPAP.  VBG unremarkable.  CBC is  normal except for pronounced leukocytosis which is likely due to steroid use.  I have ordered the patient antibiotics to cover for healthcare associated pneumonia given her failure to improve with previous antibiotic treatments.   [PS]    Clinical Course User Index [PS] Sharman CheekStafford, Kimi Kroft, MD     ____________________________________________   FINAL CLINICAL IMPRESSION(S) / ED DIAGNOSES    Final diagnoses:  Chronic respiratory failure with hypoxia (HCC)  COPD exacerbation (HCC)  HCAP (healthcare-associated pneumonia)  Failure of outpatient treatment     ED Discharge Orders    None      Portions of this note were generated with dragon dictation software. Dictation errors may occur despite best attempts at proofreading.   Sharman CheekStafford, Alison Kubicki, MD 08/31/18 [phone]

## 2018-08-31 NOTE — H&P (Signed)
[facility] [place] [facility]   PATIENT NAME: Caitlyn Evans    MR#:  [id number]  DATE OF BIRTH:  07-Oct-1965  DATE OF ADMISSION:  08/31/2018  PRIMARY CARE PHYSICIAN: Corky DownsMasoud, Javed, MD   REQUESTING/REFERRING PHYSICIAN:   CHIEF COMPLAINT:   Chief Complaint  Patient presents with  . Shortness of Breath    HISTORY OF PRESENT ILLNESS: Caitlyn Evans  is a 52 y.o. female with a known history of congestive heart failure, Boop, COPD, hyperlipidemia, hypertension presented to the emergency room for shortness of breath and wheezing for the last few days.  Patient uses oxygen at 3 L via nasal cannula at home.  She was intubated in the past for respiratory failure.  When she presented to the emergency room she was hypoxic tachypneic and wheezing.  She was put on BiPAP and stabilized.  Patient was given nebulization treatments.  She was given broad-spectrum IV vancomycin and cefepime antibiotic.  Patient tried antibiotics Levaquin and Zithromax as outpatient.  No complaints of any chest pain.  Has wheezing and shortness of breath.  PAST MEDICAL HISTORY:   Past Medical History:  Diagnosis Date  . Anxiety    panic attacks  . Asthma   . BOOP (bronchiolitis obliterans with organizing pneumonia) (HCC)   . CHF (congestive heart failure) (HCC)   . Dysphagia, pharyngoesophageal phase 05/30/2015  . Emphysema lung (HCC) 05/30/2015  . Hypercholesterolemia   . Hypertension   . Lung mass   . Migraines   . Motion sickness    all moving vehicles  . Myocardial infarction (HCC) 2012  . Seasonal allergies   . Shortness of breath dyspnea    1 flight-stairs    PAST SURGICAL HISTORY:  Past Surgical History:  Procedure Laterality Date  . COLONOSCOPY WITH PROPOFOL N/A 06/02/2015   Procedure: COLONOSCOPY WITH PROPOFOL;  Surgeon: Midge Miniumarren Wohl, MD;  [facility];  Service: Endoscopy;  Laterality: N/A;  . ESOPHAGOGASTRODUODENOSCOPY (EGD) WITH PROPOFOL N/A 06/02/2015    Procedure: ESOPHAGOGASTRODUODENOSCOPY (EGD) WITH PROPOFOL withdialation;  Surgeon: Midge Miniumarren Wohl, MD;  [facility];  Service: Endoscopy;  Laterality: N/A;  . ESOPHAGOGASTRODUODENOSCOPY (EGD) WITH PROPOFOL N/A 11/05/2017   Procedure: ESOPHAGOGASTRODUODENOSCOPY (EGD) WITH PROPOFOL;  Surgeon: Pasty Spillersahiliani, Varnita B, MD;  Location: [facility];  Service: Endoscopy;  Laterality: N/A;  . LUNG SURGERY Left    Upper lobe removed  . OOPHORECTOMY Left   . VAGINAL HYSTERECTOMY      SOCIAL HISTORY:  Social History   Tobacco Use  . Smoking status: Former Smoker    Packs/day: 1.00    Years: 30.00    Pack years: 30.00    Types: Cigarettes  . Smokeless tobacco: Never Used  . Tobacco comment: quit some time in 2012  Substance Use Topics  . Alcohol use: No    Alcohol/week: 0.0 standard drinks    Comment: rare consumption    FAMILY HISTORY:  Family History  Problem Relation Age of Onset  . COPD Mother   . Heart attack Mother   . Stroke Father   . COPD Father     DRUG ALLERGIES:  Allergies  Allergen Reactions  . Penicillins Hives and Other (See Comments)    Has patient had a PCN reaction causing immediate rash, facial/tongue/throat swelling, SOB or lightheadedness with hypotension: No Has patient had a PCN reaction causing severe rash involving mucus membranes or skin necrosis: No Has patient had a PCN reaction that required hospitalization No Has patient had a PCN reaction  occurring within the last 10 years: No If all of the above answers are "NO", then may proceed with Cephalosporin use. Other reaction(s): Other (See Comments) Has patient had a PCN reaction causing immediate rash, facial/tongue/throat swelling, SOB or lightheadedness with hypotension: No Has patient had a PCN reaction causing severe rash involving mucus membranes or skin necrosis: No Has patient had a PCN reaction that required hospitalization No Has patient had a PCN reaction occurring within the  last 10 years: No If all of the above answers are "NO", then may proceed with Cephalosporin use.  . Codeine Hives, Nausea And Vomiting and Nausea Only    REVIEW OF SYSTEMS:   CONSTITUTIONAL: No fever, fatigue or weakness.  EYES: No blurred or double vision.  EARS, NOSE, AND THROAT: No tinnitus or ear pain.  RESPIRATORY: Has cough, shortness of breath, wheezing  No  hemoptysis.  CARDIOVASCULAR: No chest pain, orthopnea, edema.  GASTROINTESTINAL: No nausea, vomiting, diarrhea or abdominal pain.  GENITOURINARY: No dysuria, hematuria.  ENDOCRINE: No polyuria, nocturia,  HEMATOLOGY: No anemia, easy bruising or bleeding SKIN: No rash or lesion. MUSCULOSKELETAL: No joint pain or arthritis.   NEUROLOGIC: No tingling, numbness, weakness.  PSYCHIATRY: No anxiety or depression.   MEDICATIONS AT HOME:  Prior to Admission medications   Medication Sig Start Date End Date Taking? Authorizing Provider  ALPRAZolam Prudy Feeler) 0.5 MG tablet Take 0.5 mg by mouth 3 (three) times daily.    Yes [provider]  diltiazem (CARDIZEM CD) 180 MG 24 hr capsule Take 1 capsule (180 mg total) by mouth daily. 11/26/17  Yes Enid Baas, MD  donepezil (ARICEPT) 10 MG tablet Take 10 mg by mouth daily.    Yes [provider]  furosemide (LASIX) 20 MG tablet Take 1 tablet (20 mg total) by mouth daily. 11/26/17  Yes Enid Baas, MD  ipratropium-albuterol (DUONEB) 0.5-2.5 (3) MG/3ML SOLN Inhale 3 mLs into the lungs every 4 (four) hours as needed (for wheezing/shortness of breath). Patient taking differently: Inhale 3 mLs into the lungs every 4 (four) hours as needed. For wheezing/shortness of breath 05/13/16  Yes Enid Baas, MD  metoprolol tartrate (LOPRESSOR) 25 MG tablet Take 1 tablet (25 mg total) by mouth 2 (two) times daily. 11/25/17  Yes Enid Baas, MD  pantoprazole (PROTONIX) 40 MG tablet TAKE ONE TABLET BY MOUTH TWICE DAILY 04/03/18  Yes Melodie Bouillon B, MD  predniSONE  (DELTASONE) 10 MG tablet Take 1 tablet (10 mg total) by mouth 3 (three) times daily. Patient taking differently: Take 10 mg by mouth 2 (two) times daily with a meal.  11/28/17  Yes Enid Baas, MD  rosuvastatin (CRESTOR) 10 MG tablet Take 10 mg by mouth daily.  10/10/17  Yes [provider]  tamsulosin (FLOMAX) 0.4 MG CAPS capsule Take 1 capsule (0.4 mg total) by mouth daily. 11/26/17  Yes Enid Baas, MD      PHYSICAL EXAMINATION:   VITAL SIGNS: Blood pressure (!) 162/94, pulse 80, temperature (!) 97.5 F (36.4 C), temperature source Axillary, resp. rate (!) 24, height 4\' 10"  (1.473 m), weight 59 kg, SpO2 100 %.  GENERAL:  52 y.o.-year-old patient lying in the bed in moderate respiratory distress EYES: Pupils equal, round, reactive to light and accommodation. No scleral icterus. Extraocular muscles intact.  HEENT: Head atraumatic, normocephalic. Oropharynx and nasopharynx clear.  NECK:  Supple, no jugular venous distention. No thyroid enlargement, no tenderness.  LUNGS: Decreased breath sounds bilaterally, bilateral wheezing On BiPAP IPAP 16 EPAP 8 Rate 16 FiO2 100%  CARDIOVASCULAR: S1, S2 normal. No murmurs, rubs, or gallops.  ABDOMEN: Soft, nontender, nondistended. Bowel sounds present. No organomegaly or mass.  EXTREMITIES: No pedal edema, cyanosis, or clubbing.  NEUROLOGIC: Cranial nerves II through XII are intact. Muscle strength 5/5 in all extremities. Sensation intact. Gait not checked.  PSYCHIATRIC: The patient is alert and oriented x 3.  SKIN: No obvious rash, lesion, or ulcer.   LABORATORY PANEL:   CBC Recent Labs  Lab 08/31/18 1157  WBC 20.1*  HGB 13.3  HCT 43.2  PLT 330  MCV 87.6  MCH 27.0  MCHC 30.8  RDW 16.4*   ------------------------------------------------------------------------------------------------------------------  Chemistries  Recent Labs  Lab 08/31/18 1157  NA 139  K 4.3  CL 100  CO2 28  GLUCOSE 105*  BUN 19   CREATININE 0.72  CALCIUM 9.0   ------------------------------------------------------------------------------------------------------------------ estimated creatinine clearance is 61 mL/min (by C-G formula based on SCr of 0.72 mg/dL). ------------------------------------------------------------------------------------------------------------------ No results for input(s): TSH, T4TOTAL, T3FREE, THYROIDAB in the last 72 hours.  Invalid input(s): FREET3   Coagulation profile No results for input(s): INR, PROTIME in the last 168 hours. ------------------------------------------------------------------------------------------------------------------- No results for input(s): DDIMER in the last 72 hours. -------------------------------------------------------------------------------------------------------------------  Cardiac Enzymes No results for input(s): CKMB, TROPONINI, MYOGLOBIN in the last 168 hours.  Invalid input(s): CK ------------------------------------------------------------------------------------------------------------------ Invalid input(s): POCBNP  ---------------------------------------------------------------------------------------------------------------  Urinalysis    Component Value Date/Time   COLORURINE STRAW (A) 07/01/2017 1721   APPEARANCEUR CLEAR (A) 07/01/2017 1721   APPEARANCEUR Hazy 10/13/2011 2052   LABSPEC 1.010 07/01/2017 1721   LABSPEC 1.010 10/13/2011 2052   PHURINE 6.0 07/01/2017 1721   GLUCOSEU NEGATIVE 07/01/2017 1721   GLUCOSEU Negative 10/13/2011 2052   HGBUR MODERATE (A) 07/01/2017 1721   BILIRUBINUR NEGATIVE 07/01/2017 1721   BILIRUBINUR Negative 10/13/2011 2052   KETONESUR NEGATIVE 07/01/2017 1721   PROTEINUR NEGATIVE 07/01/2017 1721   NITRITE NEGATIVE 07/01/2017 1721   LEUKOCYTESUR NEGATIVE 07/01/2017 1721   LEUKOCYTESUR Negative 10/13/2011 2052     RADIOLOGY: Dg Chest Portable 1 View  Result Date: 08/31/2018 CLINICAL  DATA:  Respiratory distress. EXAM: PORTABLE CHEST 1 VIEW COMPARISON:  Radiographs of April 25, 2018. FINDINGS: The heart size and mediastinal contours are within normal limits. No pneumothorax or pleural effusion is noted. Stable right apical scarring is noted. No acute pulmonary disease is noted. The visualized skeletal structures are unremarkable. IMPRESSION: No acute cardiopulmonary abnormality seen. Electronically Signed   By: Lupita Raider, M.D.   On: 08/31/2018 12:12    EKG: Orders placed or performed during the hospital encounter of 08/31/18  . ED EKG  . ED EKG  . ED EKG  . ED EKG    IMPRESSION AND PLAN:  52 year old female patient with history of COPD, Boop, chronic congestive heart failure, hyperlipidemia, hypertension presented to the emergency room for wheezing and shortness of breath  -Acute on chronic hypoxic hypercapnic respiratory failure Start patient on BiPAP IV Solu-Medrol 60 MDQ 6 hourly Aggressive nebulization treatments Empirical antibiotics that is vancomycin and cefepime Patient failed outpatient Levaquin and Zithromax Intensivist consultation  -Chronic congestive heart failure Stable Medical management  -History of Boop IV steroids  -DVT prophylaxis subcu Lovenox daily  -Anxiety disorder Anxiolytics to continue  All the records are reviewed and case discussed with ED provider. Management plans discussed with the patient, family and they are in agreement.  CODE STATUS: Full code Code Status History    Date Active Date Inactive Code Status Order ID Comments User Context   11/18/2017 2000 11/25/2017  1802 Full Code [id number]  Ihor Austin, MD ED   07/01/2017 2023 07/04/2017 1648 Full Code [id number]  Altamese Dilling, MD ED   07/19/2016 1148 07/20/2016 2010 Full Code [id number]  Adrian Saran, MD Inpatient   05/09/2016 1440 05/13/2016 1437 Full Code [id number]  Merwyn Katos, MD ED   02/05/2016 1914 02/08/2016 1452 Full Code [id number]  Ramonita Lab, MD  Inpatient   12/05/2015 2302 12/09/2015 1535 Full Code [id number]  Oralia Manis, MD Inpatient       TOTAL CRITICAL CARE TIME TAKING CARE OF THIS PATIENT: 58 minutes.    Ihor Austin M.D on 08/31/2018 at 1:03 PM  Between 7am to 6pm - Pager - [phone]  After 6pm go to [email or web address] - password [email or web address] [facility]  [facility]  Office  [phone]  CC: Primary care physician; Corky Downs, MD

## 2018-08-31 NOTE — Consult Note (Signed)
Pharmacy Antibiotic Note  Caitlyn Evans is a 52 y.o. female admitted on 08/31/2018 with pneumonia.  Pharmacy has been consulted for cefepime and vancomycin dosing.  Plan: Vancomycin 1000 mg IV once followed by 11 hour stacked dosing vancomycin 750 mg every 18 hours.  Goal trough 15-20 mcg/mL.  Will draw trough prior to the fifth dose.  Cefepime 2 gm IV every 8 hours.  Will monitor CrCl for possible need to dose adjust as patient is currently borderline for adjustment.  Height: 4\' 10"  (147.3 cm) Weight: 130 lb (59 kg) IBW/kg (Calculated) : 40.9  Temp (24hrs), Avg:97.5 F (36.4 C), Min:97.5 F (36.4 C), Max:97.5 F (36.4 C)  Recent Labs  Lab 08/31/18 1157  WBC 20.1*  CREATININE 0.72    Estimated Creatinine Clearance: 61 mL/min (by C-G formula based on SCr of 0.72 mg/dL).    Allergies  Allergen Reactions  . Penicillins Hives and Other (See Comments)    Has patient had a PCN reaction causing immediate rash, facial/tongue/throat swelling, SOB or lightheadedness with hypotension: No Has patient had a PCN reaction causing severe rash involving mucus membranes or skin necrosis: No Has patient had a PCN reaction that required hospitalization No Has patient had a PCN reaction occurring within the last 10 years: No If all of the above answers are "NO", then may proceed with Cephalosporin use. Other reaction(s): Other (See Comments) Has patient had a PCN reaction causing immediate rash, facial/tongue/throat swelling, SOB or lightheadedness with hypotension: No Has patient had a PCN reaction causing severe rash involving mucus membranes or skin necrosis: No Has patient had a PCN reaction that required hospitalization No Has patient had a PCN reaction occurring within the last 10 years: No If all of the above answers are "NO", then may proceed with Cephalosporin use.  . Codeine Hives, Nausea And Vomiting and Nausea Only    Antimicrobials this admission: Cefepime 12/16 >>  Vanco 12/16  >>   Dose adjustments this admission:   Microbiology results:  BCx:   UCx:    Sputum:    MRSA PCR:   Thank you for allowing pharmacy to be a part of this patient's care.  Orinda Kennerhris A Judia Arnott, PharmD Clinical Pharmacist 08/31/2018 1:08 PM

## 2018-08-31 NOTE — ED Notes (Signed)
Hospitalist to bedside at this time 

## 2018-09-01 LAB — CBC
HCT: 36.6 % (ref 36.0–46.0)
HEMOGLOBIN: 11.2 g/dL — AB (ref 12.0–15.0)
MCH: 26.5 pg (ref 26.0–34.0)
MCHC: 30.6 g/dL (ref 30.0–36.0)
MCV: 86.7 fL (ref 80.0–100.0)
Platelets: 279 10*3/uL (ref 150–400)
RBC: 4.22 MIL/uL (ref 3.87–5.11)
RDW: 16.1 % — ABNORMAL HIGH (ref 11.5–15.5)
WBC: 10.5 10*3/uL (ref 4.0–10.5)
nRBC: 0 % (ref 0.0–0.2)

## 2018-09-01 LAB — BASIC METABOLIC PANEL
Anion gap: 7 (ref 5–15)
BUN: 30 mg/dL — ABNORMAL HIGH (ref 6–20)
CO2: 28 mmol/L (ref 22–32)
Calcium: 8.2 mg/dL — ABNORMAL LOW (ref 8.9–10.3)
Chloride: 103 mmol/L (ref 98–111)
Creatinine, Ser: 0.83 mg/dL (ref 0.44–1.00)
GFR calc Af Amer: >60 mL/min (ref >60)
GFR calc non Af Amer: >60 mL/min (ref >60)
GLUCOSE: 115 mg/dL — AB (ref 70–99)
POTASSIUM: 4.2 mmol/L (ref 3.5–5.1)
Sodium: 138 mmol/L (ref 135–145)

## 2018-09-01 LAB — HIV ANTIBODY (ROUTINE TESTING W REFLEX): HIV Screen 4th Generation wRfx: NONREACTIVE

## 2018-09-01 LAB — GLUCOSE, CAPILLARY: Glucose-Capillary: 98 mg/dL (ref 70–99)

## 2018-09-01 MED ORDER — OXYBUTYNIN CHLORIDE ER 5 MG PO TB24
5.0000 mg | ORAL_TABLET | Freq: Every day | ORAL | Status: DC
  Administered 2018-09-01: 5 mg via ORAL
  Filled 2018-09-01 (×2): qty 1

## 2018-09-01 MED ORDER — ALPRAZOLAM 0.5 MG PO TABS
0.5000 mg | ORAL_TABLET | Freq: Three times a day (TID) | ORAL | Status: DC | PRN
  Administered 2018-09-01 (×2): 0.5 mg via ORAL
  Filled 2018-09-01 (×2): qty 1

## 2018-09-01 MED ORDER — ALBUTEROL SULFATE (2.5 MG/3ML) 0.083% IN NEBU
2.5000 mg | INHALATION_SOLUTION | RESPIRATORY_TRACT | Status: DC | PRN
  Administered 2018-09-01: 2.5 mg via RESPIRATORY_TRACT
  Filled 2018-09-01 (×3): qty 3

## 2018-09-01 NOTE — Progress Notes (Signed)
Report given to Deidre, RN- patient being transferred to 1C room 125.

## 2018-09-01 NOTE — Consult Note (Signed)
   Name: Caitlyn Evans MRN: [id number] DOB: [birth year]     CONSULTATION DATE: 08/31/2018  REFERRING MD :  pyreddy  CHIEF COMPLAINT:  Follow up SOB    HISTORY OF PRESENT ILLNESS:  Wheezing persistent SOB slightly improved On biPAP last night      Review of Systems:  Gen:  Denies  fever, sweats, chills weigh loss  HEENT: Denies blurred vision, double vision, ear pain, eye pain, hearing loss, nose bleeds, sore throat Cardiac:  No dizziness, chest pain or heaviness, chest tightness,edema, No JVD Resp:   +shortness of breath,+wheezing, -hemoptysis,  Other:  All other systems negative     VITAL SIGNS: Temp:  [97.5 F (36.4 C)-98.3 F (36.8 C)] 98 F (36.7 C) (12/17 0400) Pulse Rate:  [50-102] 55 (12/17 0600) Resp:  [12-31] 17 (12/17 0600) BP: (89-179)/(45-117) 101/63 (12/17 0600) SpO2:  [96 %-100 %] 100 % (12/17 0600) FiO2 (%):  [45 %] 45 % (12/17 0234) Weight:  [55.3 kg-59 kg] 55.3 kg (12/16 1500)  Physical Examination:   GENERAL:NAD, no fevers, chills, no weakness no fatigue HEAD: Normocephalic, atraumatic.  EYES: Pupils equal, round, reactive to light. Extraocular muscles intact. No scleral icterus.  MOUTH: Moist mucosal membrane. Dentition intact. No abscess noted.  EAR, NOSE, THROAT: Clear without exudates. No external lesions.  NECK: Supple. No thyromegaly. No nodules. No JVD.  PULMONARY: CTA B/L + wheezing, CARDIOVASCULAR: S1 and S2. Regular rate and rhythm. No murmurs, rubs, or gallops. No edema. Pedal pulses 2+ bilaterally.  GASTROINTESTINAL: Soft, nontender, nondistended. No masses. Positive bowel sounds. No hepatosplenomegaly.  MUSCULOSKELETAL: No swelling, clubbing, or edema. Range of motion full in all extremities.  NEUROLOGIC: Cranial nerves II through XII are intact. No gross focal neurological deficits. Sensation intact. Reflexes intact.  SKIN: No ulceration, lesions, rashes, or cyanosis. Skin warm and dry. Turgor intact.  PSYCHIATRIC: Mood,  affect within normal limits. The patient is awake, alert and oriented x 3. Insight, judgment intact.  ALL OTHER ROS ARE NEGATIVE        ASSESSMENT / PLAN:  SEVERE COPD EXACERBATION-slowly improving -continue IV steroids as prescribed -continue NEB THERAPY as prescribed -morphine as needed -wean fio2 as needed and tolerated    Severe Hypoxic and Hypercapnic Respiratory Failure Wean off biPAP  Ok to transfer to gen med floor    Tyrin Herbers Santiago Gladavid Tayvia Faughnan, M.D.  [facility]  Medical Director [facility] Medical Director [facility]

## 2018-09-01 NOTE — Progress Notes (Signed)
[employer] - [place] [facility]   PATIENT NAME: Caitlyn Evans    MR#:  [id number]  DATE OF BIRTH:  June 03, 1966  SUBJECTIVE:  CHIEF COMPLAINT:   Chief Complaint  Patient presents with  . Shortness of Breath   Still has SOB, cough, congestion  REVIEW OF SYSTEMS:    Review of Systems  Constitutional: Positive for malaise/fatigue. Negative for chills and fever.  HENT: Negative for sore throat.   Eyes: Negative for blurred vision, double vision and pain.  Respiratory: Positive for cough, shortness of breath and wheezing. Negative for hemoptysis.   Cardiovascular: Negative for chest pain, palpitations, orthopnea and leg swelling.  Gastrointestinal: Negative for abdominal pain, constipation, diarrhea, heartburn, nausea and vomiting.  Genitourinary: Negative for dysuria and hematuria.  Musculoskeletal: Negative for back pain and joint pain.  Skin: Negative for rash.  Neurological: Negative for sensory change, speech change, focal weakness and headaches.  Endo/Heme/Allergies: Does not bruise/bleed easily.  Psychiatric/Behavioral: Negative for depression. The patient is not nervous/anxious.     DRUG ALLERGIES:   Allergies  Allergen Reactions  . Penicillins Hives and Other (See Comments)    Has patient had a PCN reaction causing immediate rash, facial/tongue/throat swelling, SOB or lightheadedness with hypotension: No Has patient had a PCN reaction causing severe rash involving mucus membranes or skin necrosis: No Has patient had a PCN reaction that required hospitalization No Has patient had a PCN reaction occurring within the last 10 years: No If all of the above answers are "NO", then may proceed with Cephalosporin use. Other reaction(s): Other (See Comments) Has patient had a PCN reaction causing immediate rash, facial/tongue/throat swelling, SOB or lightheadedness with hypotension: No Has patient had a PCN reaction causing severe rash involving mucus membranes  or skin necrosis: No Has patient had a PCN reaction that required hospitalization No Has patient had a PCN reaction occurring within the last 10 years: No If all of the above answers are "NO", then may proceed with Cephalosporin use.  . Codeine Hives, Nausea And Vomiting and Nausea Only    VITALS:  Blood pressure 117/70, pulse 69, temperature 98.9 F (37.2 C), temperature source Oral, resp. rate 20, height 4\' 10"  (1.473 m), weight 55.3 kg, SpO2 98 %.  PHYSICAL EXAMINATION:   Physical Exam  GENERAL:  52 y.o.-year-old patient lying in the bed with resp distress EYES: Pupils equal, round, reactive to light and accommodation. No scleral icterus. Extraocular muscles intact.  HEENT: Head atraumatic, normocephalic. Oropharynx and nasopharynx clear.  NECK:  Supple, no jugular venous distention. No thyroid enlargement, no tenderness.  LUNGS: Bilateral wheezing and decreased air entry CARDIOVASCULAR: S1, S2 normal. No murmurs, rubs, or gallops.  ABDOMEN: Soft, nontender, nondistended. Bowel sounds present. No organomegaly or mass.  EXTREMITIES: No cyanosis, clubbing or edema b/l.    NEUROLOGIC: Cranial nerves II through XII are intact. No focal Motor or sensory deficits b/l.   PSYCHIATRIC: The patient is alert and oriented x 3.  SKIN: No obvious rash, lesion, or ulcer.   LABORATORY PANEL:   CBC Recent Labs  Lab 09/01/18 0428  WBC 10.5  HGB 11.2*  HCT 36.6  PLT 279   ------------------------------------------------------------------------------------------------------------------ Chemistries  Recent Labs  Lab 09/01/18 0428  NA 138  K 4.2  CL 103  CO2 28  GLUCOSE 115*  BUN 30*  CREATININE 0.83  CALCIUM 8.2*   ------------------------------------------------------------------------------------------------------------------  Cardiac Enzymes No results for input(s): TROPONINI in the last 168  hours. ------------------------------------------------------------------------------------------------------------------  RADIOLOGY:  Dg Chest Portable  1 View  Result Date: 08/31/2018 CLINICAL DATA:  Respiratory distress. EXAM: PORTABLE CHEST 1 VIEW COMPARISON:  Radiographs of April 25, 2018. FINDINGS: The heart size and mediastinal contours are within normal limits. No pneumothorax or pleural effusion is noted. Stable right apical scarring is noted. No acute pulmonary disease is noted. The visualized skeletal structures are unremarkable. IMPRESSION: No acute cardiopulmonary abnormality seen. Electronically Signed   By: Lupita RaiderJames  Green Jr, M.D.   On: 08/31/2018 12:12     ASSESSMENT AND PLAN:   52 year old female patient with history of COPD, Boop, chronic congestive heart failure, hyperlipidemia, hypertension presented to the emergency room for wheezing and shortness of breath  -Acute on chronic hypoxic and hypercapnic respiratory failure Off Bipap today and on Lynn IV Solu-Medrol 60 mg Aggressive nebulization treatments Empirical antibiotics that is vancomycin and cefepime Intensivist consultation. Discussed with Dr. Belia HemanKasa  -Chronic congestive heart failure Stable Medical management  -History of BOOP IV steroids  -DVT prophylaxis subcu Lovenox daily  -Anxiety disorder Anxiolytics to continue  All the records are reviewed and case discussed with Care Management/Social Worker Management plans discussed with the patient, family and they are in agreement.  CODE STATUS: FULL CODE  DVT Prophylaxis: SCDs  TOTAL TIME TAKING CARE OF THIS PATIENT: 30 minutes   POSSIBLE D/C IN 2-3 DAYS, DEPENDING ON CLINICAL CONDITION.  Molinda BailiffSrikar R Chanie Soucek M.D on 09/01/2018 at 12:51 PM  Between 7am to 6pm - Pager - [phone]  After 6pm go to [email or web address] - password [email or web address] [facility]  [facility] [place] [facility]  Office  [phone]  CC: Primary care physician; Corky DownsMasoud, Javed, MD  Note: This  dictation was prepared with Dragon dictation along with smaller phrase technology. Any transcriptional errors that result from this process are unintentional.

## 2018-09-02 LAB — BASIC METABOLIC PANEL
Anion gap: 6 (ref 5–15)
BUN: 40 mg/dL — ABNORMAL HIGH (ref 6–20)
CO2: 33 mmol/L — ABNORMAL HIGH (ref 22–32)
CREATININE: 0.84 mg/dL (ref 0.44–1.00)
Calcium: 8.9 mg/dL (ref 8.9–10.3)
Chloride: 99 mmol/L (ref 98–111)
GFR calc Af Amer: >60 mL/min (ref >60)
GFR calc non Af Amer: >60 mL/min (ref >60)
GLUCOSE: 133 mg/dL — AB (ref 70–99)
Potassium: 4.2 mmol/L (ref 3.5–5.1)
Sodium: 138 mmol/L (ref 135–145)

## 2018-09-02 MED ORDER — FLUCONAZOLE 50 MG PO TABS
150.0000 mg | ORAL_TABLET | Freq: Once | ORAL | Status: AC
  Administered 2018-09-02: 150 mg via ORAL
  Filled 2018-09-02: qty 1

## 2018-09-02 MED ORDER — PREDNISONE 50 MG PO TABS: 50.0000 mg | ORAL_TABLET | Freq: Every day | ORAL | 0 refills | Status: AC

## 2018-09-02 NOTE — Discharge Instructions (Signed)
Resume diet and activity as before ° ° °

## 2018-09-02 NOTE — Progress Notes (Signed)
Patient discharged home per MD orders. All discharge instructions given and all questions answered. 

## 2018-09-02 NOTE — Progress Notes (Signed)
Advance care planning  Purpose of Encounter COPD  Parties in Attendance Patient  Patients Decisional capacity Alert and oriented  Discussed with patient regarding her COPD.  Patient tells me that she has been intubated multiple times.  She had a bad experience the last time where it was a painful experience.  But would like to be intubated and resuscitated if needed.  She understands she has a progressively worsening disease and would have further complications.   CODE STATUS changed and full code orders entered  Time spent - 20 minutes

## 2018-09-09 ENCOUNTER — Emergency Department: Payer: Medicare Other

## 2018-09-09 ENCOUNTER — Inpatient Hospital Stay
Admission: EM | Admit: 2018-09-09 | Discharge: 2018-09-10 | DRG: 190 | Disposition: A | Discharge status: Home / Self Care | Payer: Medicare Other | Attending: Internal Medicine | Admitting: Internal Medicine

## 2018-09-09 ENCOUNTER — Other Ambulatory Visit: Payer: Self-pay

## 2018-09-09 DIAGNOSIS — Z823 Family history of stroke: Secondary | ICD-10-CM

## 2018-09-09 DIAGNOSIS — Z90710 Acquired absence of both cervix and uterus: Secondary | ICD-10-CM

## 2018-09-09 DIAGNOSIS — R0602 Shortness of breath: Secondary | ICD-10-CM | POA: Diagnosis not present

## 2018-09-09 DIAGNOSIS — Z9981 Dependence on supplemental oxygen: Secondary | ICD-10-CM

## 2018-09-09 DIAGNOSIS — I252 Old myocardial infarction: Secondary | ICD-10-CM

## 2018-09-09 DIAGNOSIS — G300 Alzheimer's disease with early onset: Secondary | ICD-10-CM | POA: Diagnosis not present

## 2018-09-09 DIAGNOSIS — I10 Essential (primary) hypertension: Secondary | ICD-10-CM | POA: Diagnosis not present

## 2018-09-09 DIAGNOSIS — J9621 Acute and chronic respiratory failure with hypoxia: Secondary | ICD-10-CM | POA: Diagnosis not present

## 2018-09-09 DIAGNOSIS — E785 Hyperlipidemia, unspecified: Secondary | ICD-10-CM | POA: Diagnosis not present

## 2018-09-09 DIAGNOSIS — T380X5A Adverse effect of glucocorticoids and synthetic analogues, initial encounter: Secondary | ICD-10-CM | POA: Diagnosis present

## 2018-09-09 DIAGNOSIS — D72829 Elevated white blood cell count, unspecified: Secondary | ICD-10-CM | POA: Diagnosis not present

## 2018-09-09 DIAGNOSIS — Z79899 Other long term (current) drug therapy: Secondary | ICD-10-CM

## 2018-09-09 DIAGNOSIS — I5032 Chronic diastolic (congestive) heart failure: Secondary | ICD-10-CM | POA: Diagnosis not present

## 2018-09-09 DIAGNOSIS — Z885 Allergy status to narcotic agent status: Secondary | ICD-10-CM | POA: Diagnosis not present

## 2018-09-09 DIAGNOSIS — D649 Anemia, unspecified: Secondary | ICD-10-CM | POA: Diagnosis present

## 2018-09-09 DIAGNOSIS — Z8249 Family history of ischemic heart disease and other diseases of the circulatory system: Secondary | ICD-10-CM

## 2018-09-09 DIAGNOSIS — M549 Dorsalgia, unspecified: Secondary | ICD-10-CM | POA: Diagnosis present

## 2018-09-09 DIAGNOSIS — Z90721 Acquired absence of ovaries, unilateral: Secondary | ICD-10-CM

## 2018-09-09 DIAGNOSIS — G8929 Other chronic pain: Secondary | ICD-10-CM | POA: Diagnosis present

## 2018-09-09 DIAGNOSIS — Z87891 Personal history of nicotine dependence: Secondary | ICD-10-CM | POA: Diagnosis not present

## 2018-09-09 DIAGNOSIS — Z880 Allergy status to penicillin: Secondary | ICD-10-CM | POA: Diagnosis not present

## 2018-09-09 DIAGNOSIS — E7800 Pure hypercholesterolemia, unspecified: Secondary | ICD-10-CM | POA: Diagnosis not present

## 2018-09-09 DIAGNOSIS — F419 Anxiety disorder, unspecified: Secondary | ICD-10-CM

## 2018-09-09 DIAGNOSIS — Z7952 Long term (current) use of systemic steroids: Secondary | ICD-10-CM

## 2018-09-09 DIAGNOSIS — J441 Chronic obstructive pulmonary disease with (acute) exacerbation: Principal | ICD-10-CM | POA: Diagnosis present

## 2018-09-09 DIAGNOSIS — F410 Panic disorder [episodic paroxysmal anxiety] without agoraphobia: Secondary | ICD-10-CM | POA: Diagnosis not present

## 2018-09-09 DIAGNOSIS — Z825 Family history of asthma and other chronic lower respiratory diseases: Secondary | ICD-10-CM

## 2018-09-09 DIAGNOSIS — I110 Hypertensive heart disease with heart failure: Secondary | ICD-10-CM | POA: Diagnosis not present

## 2018-09-09 DIAGNOSIS — J449 Chronic obstructive pulmonary disease, unspecified: Secondary | ICD-10-CM | POA: Diagnosis not present

## 2018-09-09 LAB — PROCALCITONIN: Procalcitonin: <0.1 ng/mL

## 2018-09-09 LAB — CBC WITH DIFFERENTIAL/PLATELET
Abs Immature Granulocytes: 0.18 10*3/uL — ABNORMAL HIGH (ref 0.00–0.07)
BASOS PCT: 0 %
Basophils Absolute: 0 10*3/uL (ref 0.0–0.1)
Eosinophils Absolute: 0.1 10*3/uL (ref 0.0–0.5)
Eosinophils Relative: 0 %
HCT: 38.2 % (ref 36.0–46.0)
Hemoglobin: 11.8 g/dL — ABNORMAL LOW (ref 12.0–15.0)
Immature Granulocytes: 1 %
Lymphocytes Relative: 9 %
Lymphs Abs: 1.3 10*3/uL (ref 0.7–4.0)
MCH: 26.6 pg (ref 26.0–34.0)
MCHC: 30.9 g/dL (ref 30.0–36.0)
MCV: 86.2 fL (ref 80.0–100.0)
Monocytes Absolute: 1.2 10*3/uL — ABNORMAL HIGH (ref 0.1–1.0)
Monocytes Relative: 8 %
Neutro Abs: 12 10*3/uL — ABNORMAL HIGH (ref 1.7–7.7)
Neutrophils Relative %: 82 %
PLATELETS: 240 10*3/uL (ref 150–400)
RBC: 4.43 MIL/uL (ref 3.87–5.11)
RDW: 16.1 % — ABNORMAL HIGH (ref 11.5–15.5)
WBC: 14.7 10*3/uL — ABNORMAL HIGH (ref 4.0–10.5)
nRBC: 0 % (ref 0.0–0.2)

## 2018-09-09 LAB — INFLUENZA PANEL BY PCR (TYPE A & B)
Influenza A By PCR: NEGATIVE
Influenza B By PCR: NEGATIVE

## 2018-09-09 LAB — BASIC METABOLIC PANEL
ANION GAP: 10 (ref 5–15)
BUN: 30 mg/dL — ABNORMAL HIGH (ref 6–20)
CO2: 26 mmol/L (ref 22–32)
Calcium: 8.8 mg/dL — ABNORMAL LOW (ref 8.9–10.3)
Chloride: 104 mmol/L (ref 98–111)
Creatinine, Ser: 0.93 mg/dL (ref 0.44–1.00)
GFR calc Af Amer: >60 mL/min (ref >60)
GFR calc non Af Amer: >60 mL/min (ref >60)
Glucose, Bld: 102 mg/dL — ABNORMAL HIGH (ref 70–99)
Potassium: 3.8 mmol/L (ref 3.5–5.1)
Sodium: 140 mmol/L (ref 135–145)

## 2018-09-09 LAB — GLUCOSE, CAPILLARY: Glucose-Capillary: 123 mg/dL — ABNORMAL HIGH (ref 70–99)

## 2018-09-09 LAB — BLOOD GAS, VENOUS
Acid-Base Excess: 5.3 mmol/L — ABNORMAL HIGH (ref 0.0–2.0)
Bicarbonate: 31.1 mmol/L — ABNORMAL HIGH (ref 20.0–28.0)
O2 Saturation: 68 %
Patient temperature: 37
pCO2, Ven: 49 mmHg (ref 44.0–60.0)
pH, Ven: 7.41 (ref 7.250–7.430)
pO2, Ven: 35 mmHg (ref 32.0–45.0)

## 2018-09-09 LAB — MRSA PCR SCREENING: MRSA by PCR: NEGATIVE

## 2018-09-09 LAB — TROPONIN I

## 2018-09-09 MED ORDER — LORAZEPAM 2 MG/ML IJ SOLN
2.0000 mg | Freq: Once | INTRAMUSCULAR | Status: DC
  Filled 2018-09-09: qty 1

## 2018-09-09 MED ORDER — SODIUM CHLORIDE 0.9 % IV SOLN
500.0000 mg | Freq: Once | INTRAVENOUS | Status: AC
  Administered 2018-09-09: 500 mg via INTRAVENOUS
  Filled 2018-09-09: qty 500

## 2018-09-09 MED ORDER — MORPHINE SULFATE (PF) 2 MG/ML IV SOLN
1.0000 mg | INTRAVENOUS | Status: DC | PRN
  Administered 2018-09-09 – 2018-09-10 (×2): 2 mg via INTRAVENOUS
  Filled 2018-09-09 (×2): qty 1

## 2018-09-09 MED ORDER — ENOXAPARIN SODIUM 40 MG/0.4ML ~~LOC~~ SOLN
40.0000 mg | SUBCUTANEOUS | Status: DC
  Filled 2018-09-09: qty 0.4

## 2018-09-09 MED ORDER — MAGNESIUM SULFATE 2 GM/50ML IV SOLN
2.0000 g | INTRAVENOUS | Status: AC
  Administered 2018-09-09: 2 g via INTRAVENOUS
  Filled 2018-09-09: qty 50

## 2018-09-09 MED ORDER — SODIUM CHLORIDE 0.9 % IV SOLN
2.0000 g | Freq: Once | INTRAVENOUS | Status: AC
  Administered 2018-09-09: 2 g via INTRAVENOUS
  Filled 2018-09-09: qty 20

## 2018-09-09 MED ORDER — LORAZEPAM 2 MG/ML IJ SOLN
1.0000 mg | Freq: Once | INTRAMUSCULAR | Status: AC
  Administered 2018-09-09: 1 mg via INTRAVENOUS

## 2018-09-09 MED ORDER — AZITHROMYCIN 500 MG PO TABS
250.0000 mg | ORAL_TABLET | Freq: Every day | ORAL | Status: DC
  Filled 2018-09-09: qty 0.5

## 2018-09-09 MED ORDER — METHYLPREDNISOLONE SODIUM SUCC 125 MG IJ SOLR
60.0000 mg | Freq: Three times a day (TID) | INTRAMUSCULAR | Status: DC
  Administered 2018-09-09 – 2018-09-10 (×2): 60 mg via INTRAVENOUS
  Filled 2018-09-09 (×2): qty 2

## 2018-09-09 MED ORDER — ALBUTEROL SULFATE (2.5 MG/3ML) 0.083% IN NEBU: 2.5000 mg | INHALATION_SOLUTION | RESPIRATORY_TRACT | Status: DC | PRN

## 2018-09-09 MED ORDER — ALPRAZOLAM 0.5 MG PO TABS
0.5000 mg | ORAL_TABLET | Freq: Three times a day (TID) | ORAL | Status: DC
  Administered 2018-09-09 – 2018-09-10 (×3): 0.5 mg via ORAL
  Filled 2018-09-09 (×3): qty 1

## 2018-09-09 MED ORDER — IPRATROPIUM-ALBUTEROL 0.5-2.5 (3) MG/3ML IN SOLN
3.0000 mL | Freq: Four times a day (QID) | RESPIRATORY_TRACT | Status: DC
  Administered 2018-09-10 (×2): 3 mL via RESPIRATORY_TRACT
  Filled 2018-09-09 (×2): qty 3

## 2018-09-09 MED ORDER — ORAL CARE MOUTH RINSE: 15.0000 mL | Freq: Two times a day (BID) | OROMUCOSAL | Status: DC

## 2018-09-09 MED ORDER — ACETAMINOPHEN 325 MG PO TABS
650.0000 mg | ORAL_TABLET | Freq: Four times a day (QID) | ORAL | Status: DC | PRN
  Filled 2018-09-09: qty 2

## 2018-09-09 MED ORDER — IPRATROPIUM-ALBUTEROL 0.5-2.5 (3) MG/3ML IN SOLN
9.0000 mL | Freq: Once | RESPIRATORY_TRACT | Status: AC
  Administered 2018-09-09: 9 mL via RESPIRATORY_TRACT
  Filled 2018-09-09: qty 9

## 2018-09-09 MED ORDER — PANTOPRAZOLE SODIUM 40 MG PO TBEC
40.0000 mg | DELAYED_RELEASE_TABLET | Freq: Two times a day (BID) | ORAL | Status: DC
  Administered 2018-09-09 – 2018-09-10 (×2): 40 mg via ORAL
  Filled 2018-09-09 (×2): qty 1

## 2018-09-09 MED ORDER — DOCUSATE SODIUM 100 MG PO CAPS
100.0000 mg | ORAL_CAPSULE | Freq: Every day | ORAL | Status: DC
  Administered 2018-09-09 – 2018-09-10 (×2): 100 mg via ORAL
  Filled 2018-09-09 (×2): qty 1

## 2018-09-09 MED ORDER — SENNA 8.6 MG PO TABS
1.0000 | ORAL_TABLET | Freq: Every day | ORAL | Status: DC
  Administered 2018-09-09 – 2018-09-10 (×2): 8.6 mg via ORAL
  Filled 2018-09-09 (×2): qty 1

## 2018-09-09 MED ORDER — TAMSULOSIN HCL 0.4 MG PO CAPS
0.4000 mg | ORAL_CAPSULE | Freq: Every day | ORAL | Status: DC
  Administered 2018-09-10: 0.4 mg via ORAL
  Filled 2018-09-09: qty 1

## 2018-09-09 MED ORDER — DONEPEZIL HCL 5 MG PO TABS
10.0000 mg | ORAL_TABLET | Freq: Every day | ORAL | Status: DC
  Administered 2018-09-10: 10 mg via ORAL
  Filled 2018-09-09: qty 2

## 2018-09-09 MED ORDER — METOPROLOL TARTRATE 25 MG PO TABS
25.0000 mg | ORAL_TABLET | Freq: Two times a day (BID) | ORAL | Status: DC
  Administered 2018-09-09 – 2018-09-10 (×2): 25 mg via ORAL
  Filled 2018-09-09 (×2): qty 1

## 2018-09-09 MED ORDER — ACETAMINOPHEN 650 MG RE SUPP: 650.0000 mg | Freq: Four times a day (QID) | RECTAL | Status: DC | PRN

## 2018-09-09 MED ORDER — ROSUVASTATIN CALCIUM 10 MG PO TABS
10.0000 mg | ORAL_TABLET | Freq: Every day | ORAL | Status: DC
  Administered 2018-09-10: 10 mg via ORAL
  Filled 2018-09-09: qty 1

## 2018-09-09 MED ORDER — POLYETHYLENE GLYCOL 3350 17 G PO PACK: 17.0000 g | PACK | Freq: Every day | ORAL | Status: DC | PRN

## 2018-09-09 MED ORDER — FUROSEMIDE 40 MG PO TABS
20.0000 mg | ORAL_TABLET | Freq: Every day | ORAL | Status: DC
  Administered 2018-09-10: 20 mg via ORAL
  Filled 2018-09-09: qty 1
  Filled 2018-09-09: qty 0.5

## 2018-09-09 MED ORDER — DILTIAZEM HCL ER COATED BEADS 180 MG PO CP24
180.0000 mg | ORAL_CAPSULE | Freq: Every day | ORAL | Status: DC
  Administered 2018-09-10: 180 mg via ORAL
  Filled 2018-09-09: qty 1

## 2018-09-09 MED ORDER — ONDANSETRON HCL 4 MG/2ML IJ SOLN: 4.0000 mg | Freq: Four times a day (QID) | INTRAMUSCULAR | Status: DC | PRN

## 2018-09-09 MED ORDER — ONDANSETRON HCL 4 MG PO TABS: 4.0000 mg | ORAL_TABLET | Freq: Four times a day (QID) | ORAL | Status: DC | PRN

## 2018-09-09 MED ORDER — METHYLPREDNISOLONE SODIUM SUCC 125 MG IJ SOLR
125.0000 mg | Freq: Once | INTRAMUSCULAR | Status: AC
  Administered 2018-09-09: 125 mg via INTRAVENOUS
  Filled 2018-09-09: qty 2

## 2018-09-09 MED ORDER — CHLORHEXIDINE GLUCONATE 0.12 % MT SOLN
15.0000 mL | Freq: Two times a day (BID) | OROMUCOSAL | Status: DC
  Administered 2018-09-10: 15 mL via OROMUCOSAL
  Filled 2018-09-09 (×2): qty 15

## 2018-09-09 MED ORDER — LORAZEPAM 2 MG/ML IJ SOLN
INTRAMUSCULAR | Status: AC
  Administered 2018-09-09: 1 mg via INTRAVENOUS
  Filled 2018-09-09: qty 1

## 2018-09-09 NOTE — ED Provider Notes (Signed)
[facility] Emergency Department Provider Note  ____________________________________________  Time seen: Approximately 6:30 PM  I have reviewed the triage vital signs and the nursing notes.   HISTORY  Chief Complaint Shortness of Breath  Level 5 Caveat: Portions of the History and Physical including HPI and review of systems are unable to be completely obtained due to patient being a poor historian    HPI Caitlyn Evans is a 52 y.o. female with a history of anxiety, asthma, bronchiolitis obliterans, CHF, emphysema, hypertension who complains of worsening shortness of breath since about December 20, a day after she last left the hospital.  Gradual onset, worsening.  Not alleviated by her nebulizer treatments at home.  Denies productive cough fever or chills.  Denies any significant chest pain.      Past Medical History:  Diagnosis Date  . Anxiety    panic attacks  . Asthma   . BOOP (bronchiolitis obliterans with organizing pneumonia) (HCC)   . CHF (congestive heart failure) (HCC)   . Dysphagia, pharyngoesophageal phase 05/30/2015  . Emphysema lung (HCC) 05/30/2015  . Hypercholesterolemia   . Hypertension   . Lung mass   . Migraines   . Motion sickness    all moving vehicles  . Myocardial infarction (HCC) 2012  . Seasonal allergies   . Shortness of breath dyspnea    1 flight-stairs     Patient Active Problem List   Diagnosis Date Noted  . Esophagitis, unspecified   . Stomach irritation   . Hiatal hernia   . COPD (chronic obstructive pulmonary disease) (HCC) 07/19/2016  . Acute on chronic respiratory failure (HCC) 05/09/2016  . Noncompliance with medication regimen 05/09/2016  . COPD with acute exacerbation (HCC) 02/05/2016  . Acute exacerbation of chronic obstructive pulmonary disease (COPD) (HCC)   . Acute respiratory failure (HCC)   . Acute respiratory failure with hypoxia (HCC) 12/05/2015  . 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 Depression, major, single episode, moderate (HCC) 07/14/2015  . Adjustment disorder with anxiety 07/14/2015  . COPD exacerbation (HCC) 07/14/2015  . Special screening for malignant neoplasms, colon   . Swallowing difficulty   . Loss of weight   . Esophagogastric ulcer   . Feline esophagus   . Hypertension 05/30/2015  . Emphysema lung (HCC) 05/30/2015  . Dysphagia, pharyngoesophageal phase 05/30/2015     Past Surgical History:  Procedure Laterality Date  . COLONOSCOPY WITH PROPOFOL N/A 06/02/2015   Procedure: COLONOSCOPY WITH PROPOFOL;  Surgeon: Midge Miniumarren Wohl, MD;  [facility];  Service: Endoscopy;  Laterality: N/A;  . ESOPHAGOGASTRODUODENOSCOPY (EGD) WITH PROPOFOL N/A 06/02/2015   Procedure: ESOPHAGOGASTRODUODENOSCOPY (EGD) WITH PROPOFOL withdialation;  Surgeon: Midge Miniumarren Wohl, MD;  [facility];  Service: Endoscopy;  Laterality: N/A;  . ESOPHAGOGASTRODUODENOSCOPY (EGD) WITH PROPOFOL N/A 11/05/2017   Procedure: ESOPHAGOGASTRODUODENOSCOPY (EGD) WITH PROPOFOL;  Surgeon: Pasty Spillersahiliani, Varnita B, MD;  Location: [facility];  Service: Endoscopy;  Laterality: N/A;  . LUNG SURGERY Left    Upper lobe removed  . OOPHORECTOMY Left   . VAGINAL HYSTERECTOMY       Prior to Admission medications   Medication Sig Start Date End Date Taking? Authorizing Provider  ALPRAZolam Prudy Feeler(XANAX) 0.5 MG tablet Take 0.5 mg by mouth 3 (three) times daily.    Yes [provider]  diltiazem (CARDIZEM CD) 180 MG 24 hr capsule Take 1 capsule (180 mg total) by mouth daily. 11/26/17  Yes Enid BaasKalisetti, Radhika, MD  donepezil (ARICEPT) 10 MG tablet  Take 10 mg by mouth daily.    Yes [provider]  furosemide (LASIX) 20 MG tablet Take 1 tablet (20 mg total) by mouth daily. 11/26/17  Yes Enid BaasKalisetti, Radhika, MD  ipratropium-albuterol (DUONEB) 0.5-2.5 (3) MG/3ML SOLN Inhale 3 mLs into the lungs every 4 (four) hours as needed (for wheezing/shortness of  breath). Patient taking differently: Inhale 3 mLs into the lungs every 4 (four) hours as needed. For wheezing/shortness of breath 05/13/16  Yes Enid BaasKalisetti, Radhika, MD  metoprolol tartrate (LOPRESSOR) 25 MG tablet Take 1 tablet (25 mg total) by mouth 2 (two) times daily. 11/25/17  Yes Enid BaasKalisetti, Radhika, MD  pantoprazole (PROTONIX) 40 MG tablet TAKE ONE TABLET BY MOUTH TWICE DAILY Patient taking differently: Take 40 mg by mouth 2 (two) times daily.  04/03/18  Yes Pasty Spillersahiliani, Varnita B, MD  predniSONE (DELTASONE) 10 MG tablet Take 1 tablet (10 mg total) by mouth 3 (three) times daily. Patient taking differently: Take 10 mg by mouth 2 (two) times daily with a meal.  11/28/17  Yes Enid BaasKalisetti, Radhika, MD  rosuvastatin (CRESTOR) 10 MG tablet Take 10 mg by mouth daily.  10/10/17  Yes [provider]  tamsulosin (FLOMAX) 0.4 MG CAPS capsule Take 1 capsule (0.4 mg total) by mouth daily. 11/26/17  Yes Enid BaasKalisetti, Radhika, MD     Allergies Penicillins and Codeine   Family History  Problem Relation Age of Onset  . COPD Mother   . Heart attack Mother   . Stroke Father   . COPD Father     Social History Social History   Tobacco Use  . Smoking status: Former Smoker    Packs/day: 1.00    Years: 30.00    Pack years: 30.00    Types: Cigarettes  . Smokeless tobacco: Never Used  . Tobacco comment: quit some time in 2012  Substance Use Topics  . Alcohol use: No    Alcohol/week: 0.0 standard drinks    Comment: rare consumption  . Drug use: No    Review of Systems  Constitutional:   No fever or chills.  ENT:   No sore throat. No rhinorrhea. Cardiovascular:   No chest pain or syncope. Respiratory: Positive shortness of breath and nonproductive cough. Gastrointestinal:   Negative for abdominal pain, vomiting and diarrhea.  Musculoskeletal:   Negative for focal pain or swelling All other systems reviewed and are negative except as documented above in ROS and  HPI.  ____________________________________________   PHYSICAL EXAM:  VITAL SIGNS: ED Triage Vitals  Enc Vitals Group     BP 09/09/18 1655 (!) 166/96     Pulse Rate 09/09/18 1655 94     Resp 09/09/18 1655 (!) 31     Temp 09/09/18 1655 98.4 F (36.9 C)     Temp Source 09/09/18 1655 Oral     SpO2 09/09/18 1655 100 %     Weight 09/09/18 1656 121 lb 14.6 oz (55.3 kg)     Height 09/09/18 1656 4\' 10"  (1.473 m)     Head Circumference --      Peak Flow --      Pain Score 09/09/18 1656 9     Pain Loc --      Pain Edu? --      Excl. in GC? --     Vital signs reviewed, nursing assessments reviewed.   Constitutional:   Alert and oriented. Non-toxic appearance. Eyes:   Conjunctivae are normal. EOMI. PERRL. ENT      Head:   Normocephalic and  atraumatic.      Nose:   No congestion/rhinnorhea.       Mouth/Throat:   MMM, no pharyngeal erythema. No peritonsillar mass.       Neck:   No meningismus. Full ROM.  Trachea midline Hematological/Lymphatic/Immunilogical:   No cervical lymphadenopathy. Cardiovascular:   RRR. Symmetric bilateral radial and DP pulses.  No murmurs. Cap refill less than 2 seconds. Respiratory:   Tachypnea and accessory muscle use.  Severely prolonged expiratory phase and diffuse expiratory wheezing.  Symmetric air entry in all lung fields.  No focal crackles. Gastrointestinal:   Soft and nontender. Non distended. There is no CVA tenderness.  No rebound, rigidity, or guarding. Musculoskeletal:   Normal range of motion in all extremities. No joint effusions.  No lower extremity tenderness.  No edema. Neurologic:   Normal speech and language.  Motor grossly intact. No acute focal neurologic deficits are appreciated.  Skin:    Skin is warm, dry and intact. No rash noted.  No petechiae, purpura, or bullae.  ____________________________________________    LABS (pertinent positives/negatives) (all labs ordered are listed, but only abnormal results are displayed) Labs  Reviewed  BASIC METABOLIC PANEL - Abnormal; Notable for the following components:      Result Value   Glucose, Bld 102 (*)    BUN 30 (*)    Calcium 8.8 (*)    All other components within normal limits  CBC WITH DIFFERENTIAL/PLATELET - Abnormal; Notable for the following components:   WBC 14.7 (*)    Hemoglobin 11.8 (*)    RDW 16.1 (*)    Neutro Abs 12.0 (*)    Monocytes Absolute 1.2 (*)    Abs Immature Granulocytes 0.18 (*)    All other components within normal limits  BLOOD GAS, VENOUS - Abnormal; Notable for the following components:   Bicarbonate 31.1 (*)    Acid-Base Excess 5.3 (*)    All other components within normal limits  TROPONIN I   ____________________________________________   EKG    ____________________________________________    RADIOLOGY  Dg Chest Portable 1 View  Result Date: 09/09/2018 CLINICAL DATA:  Stage IV COPD with dyspnea EXAM: PORTABLE CHEST 1 VIEW COMPARISON:  08/31/2018 FINDINGS: Mild pulmonary hyperinflation. Normal heart size and mediastinal contours with minimal aortic atherosclerosis at the arch. Vascular calcifications project over the right lung apex as well. No thoracic aortic aneurysm. No effusion nor pneumothorax. No acute osseous abnormality. IMPRESSION: Mild pulmonary hyperinflation without active pulmonary disease. Electronically Signed   By: Tollie Eth M.D.   On: 09/09/2018 17:14    ____________________________________________   PROCEDURES .Critical Care Performed by: Sharman Cheek, MD Authorized by: Sharman Cheek, MD   Critical care provider statement:    Critical care time (minutes):  35   Critical care time was exclusive of:  Separately billable procedures and treating other patients   Critical care was necessary to treat or prevent imminent or life-threatening deterioration of the following conditions:  Respiratory failure   Critical care was time spent personally by me on the following activities:  Development of  treatment plan with patient or surrogate, discussions with consultants, evaluation of patient's response to treatment, examination of patient, obtaining history from patient or surrogate, ordering and performing treatments and interventions, ordering and review of laboratory studies, ordering and review of radiographic studies, pulse oximetry, re-evaluation of patient's condition and review of old charts    ____________________________________________    CLINICAL IMPRESSION / ASSESSMENT AND PLAN / ED COURSE  Pertinent labs & imaging  results that were available during my care of the patient were reviewed by me and considered in my medical decision making (see chart for details).    Patient presents with wheezing and respiratory distress consistent with COPD exacerbation.Considering the patient's symptoms, medical history, and physical examination today, I have low suspicion for ACS, PE, TAD, pneumothorax, carditis, mediastinitis, pneumonia, CHF, or sepsis.  I think there is likely an anxiety component to her symptoms so patient is given a milligram of IV Ativan as well while starting her on BiPAP, giving duo nebs magnesium and Solu-Medrol.  Clinical Course as of Sep 09 1829  Wed Sep 09, 2018  1745 Chest x-ray shows no evidence of pneumonia.  Patient is afebrile, white blood cell count is only mildly elevated.  PCO2 is normal.  Doubt HCAP.  I will give Levaquin due to COPD exacerbation.   [PS]    Clinical Course User Index [PS] Sharman Cheek, MD     ----------------------------------------- 6:33 PM on 09/09/2018 -----------------------------------------  Patient improved but still anxious appearing, second milligram of IV Ativan given.  Case discussed with the hospitalist for further management.  I will trial the patient off of BiPAP since her oxygenation and PCO2 are both normal and her chest x-ray is nonacute.  Decided to give the patient ceftriaxone and azithromycin due to the  black box warning of Levaquin.  ____________________________________________   FINAL CLINICAL IMPRESSION(S) / ED DIAGNOSES    Final diagnoses:  COPD exacerbation (HCC)  Anxiety     ED Discharge Orders    None      Portions of this note were generated with dragon dictation software. Dictation errors may occur despite best attempts at proofreading.   Sharman Cheek, MD 09/09/18 [id number]

## 2018-09-09 NOTE — ED Triage Notes (Signed)
Pt  To ED via POV in respiratory distress. Pt reports stage 4 COPD and recent hospitalization. Pt has been SOB for the past two days with a cough. No fevers.

## 2018-09-09 NOTE — Consult Note (Signed)
Name: Caitlyn Evans MRN: [id number] DOB: 1966/07/01    ADMISSION DATE:  09/09/2018 CONSULTATION DATE: 09/09/2018  REFERRING MD : Dr. Nancy Marus   CHIEF COMPLAINT: Shortness of Breath   BRIEF PATIENT DESCRIPTION:  52 yo female admitted with acute on chronic respiratory failure secondary to AECOPD requiring Bipap   SIGNIFICANT EVENTS  12/25-Pt admitted to the stepdown unit on Bipap   HISTORY OF PRESENT ILLNESS:   This is a 52 yo female with a PMH of Seasonal Allergies, Myocardial Infarction, Migraines, Lung Mass, HTN, Hypercholesterolemia, BOOP, End Stage COPD, Several Intubations, Early Onset Alzheimer's Dementia, CHF, Asthma, and Anxiety.  She presented to [facility] ER on 12/25 with shortness of breath and cough onset of symptoms 2 days prior to presentation.  She was recently admitted to [facility] 08/31/18-09/02/18 due to AECOPD.  During current presentation pt placed on Bipap.  She received 2 mg iv ativan, azithromycin, ceftriaxone, 2 g magnesium, and 125 mg solumedrol.  Lab results revealed troponin <0.03, wbc 14.7, hgb 11.8, and vbg pH 7.41/pCO2 48.  CXR negative for pneumonia.  She was subsequently admitted to the stepdown unit for additional workup and treatment.   PAST MEDICAL HISTORY :   has a past medical history of Anxiety, Asthma, BOOP (bronchiolitis obliterans with organizing pneumonia) (HCC), CHF (congestive heart failure) (HCC), Dysphagia, pharyngoesophageal phase (05/30/2015), Emphysema lung (HCC) (05/30/2015), Hypercholesterolemia, Hypertension, Lung mass, Migraines, Motion sickness, Myocardial infarction (HCC) (2012), Seasonal allergies, and Shortness of breath dyspnea.  has a past surgical history that includes Vaginal hysterectomy; Lung surgery (Left); Oophorectomy (Left); Colonoscopy with propofol (N/A, 06/02/2015); Esophagogastroduodenoscopy (egd) with propofol (N/A, 06/02/2015); and Esophagogastroduodenoscopy (egd) with propofol (N/A, 11/05/2017). Prior to Admission medications     Medication Sig Start Date End Date Taking? Authorizing Provider  ALPRAZolam Prudy Feeler) 0.5 MG tablet Take 0.5 mg by mouth 3 (three) times daily.    Yes [provider]  diltiazem (CARDIZEM CD) 180 MG 24 hr capsule Take 1 capsule (180 mg total) by mouth daily. 11/26/17  Yes Enid Baas, MD  donepezil (ARICEPT) 10 MG tablet Take 10 mg by mouth daily.    Yes [provider]  furosemide (LASIX) 20 MG tablet Take 1 tablet (20 mg total) by mouth daily. 11/26/17  Yes Enid Baas, MD  ipratropium-albuterol (DUONEB) 0.5-2.5 (3) MG/3ML SOLN Inhale 3 mLs into the lungs every 4 (four) hours as needed (for wheezing/shortness of breath). Patient taking differently: Inhale 3 mLs into the lungs every 4 (four) hours as needed. For wheezing/shortness of breath 05/13/16  Yes Enid Baas, MD  metoprolol tartrate (LOPRESSOR) 25 MG tablet Take 1 tablet (25 mg total) by mouth 2 (two) times daily. 11/25/17  Yes Enid Baas, MD  pantoprazole (PROTONIX) 40 MG tablet TAKE ONE TABLET BY MOUTH TWICE DAILY Patient taking differently: Take 40 mg by mouth 2 (two) times daily.  04/03/18  Yes Pasty Spillers, MD  predniSONE (DELTASONE) 10 MG tablet Take 1 tablet (10 mg total) by mouth 3 (three) times daily. Patient taking differently: Take 10 mg by mouth 2 (two) times daily with a meal.  11/28/17  Yes Enid Baas, MD  rosuvastatin (CRESTOR) 10 MG tablet Take 10 mg by mouth daily.  10/10/17  Yes [provider]  tamsulosin (FLOMAX) 0.4 MG CAPS capsule Take 1 capsule (0.4 mg total) by mouth daily. 11/26/17  Yes Enid Baas, MD   Allergies  Allergen Reactions  . Penicillins Hives and Other (See Comments)    Has patient had a PCN reaction causing immediate  rash, facial/tongue/throat swelling, SOB or lightheadedness with hypotension: No Has patient had a PCN reaction causing severe rash involving mucus membranes or skin necrosis: No Has patient had a PCN reaction that  required hospitalization No Has patient had a PCN reaction occurring within the last 10 years: No If all of the above answers are "NO", then may proceed with Cephalosporin use. Other reaction(s): Other (See Comments) Has patient had a PCN reaction causing immediate rash, facial/tongue/throat swelling, SOB or lightheadedness with hypotension: No Has patient had a PCN reaction causing severe rash involving mucus membranes or skin necrosis: No Has patient had a PCN reaction that required hospitalization No Has patient had a PCN reaction occurring within the last 10 years: No If all of the above answers are "NO", then may proceed with Cephalosporin use.  . Codeine Hives, Nausea And Vomiting and Nausea Only    FAMILY HISTORY:  family history includes COPD in her father and mother; Heart attack in her mother; Stroke in her father. SOCIAL HISTORY:  reports that she has quit smoking. Her smoking use included cigarettes. She has a 30.00 pack-year smoking history. She has never used smokeless tobacco. She reports that she does not drink alcohol or use drugs.  REVIEW OF SYSTEMS: Positives in BOLD Constitutional: Negative for fever, chills, weight loss, malaise/fatigue and diaphoresis.  HENT: Negative for hearing loss, ear pain, nosebleeds, congestion, sore throat, neck pain, tinnitus and ear discharge.   Eyes: Negative for blurred vision, double vision, photophobia, pain, discharge and redness.  Respiratory: cough, hemoptysis, sputum production, shortness of breath, wheezing and stridor.   Cardiovascular: Negative for chest pain, palpitations, orthopnea, claudication, leg swelling and PND.  Gastrointestinal: Negative for heartburn, nausea, vomiting, abdominal pain, diarrhea, constipation, blood in stool and melena.  Genitourinary: Negative for dysuria, urgency, frequency, hematuria and flank pain.  Musculoskeletal: Negative for myalgias, back pain, joint pain and falls.  Skin: Negative for itching and  rash.  Neurological: Negative for dizziness, tingling, tremors, sensory change, speech change, focal weakness, seizures, loss of consciousness, weakness and headaches.  Endo/Heme/Allergies: Negative for environmental allergies and polydipsia. Does not bruise/bleed easily.  SUBJECTIVE:  No complaints at this time pt currently on Bipap   VITAL SIGNS: Temp:  [98.4 F (36.9 C)] 98.4 F (36.9 C) (12/25 1655) Pulse Rate:  [78-96] 89 (12/25 2030) Resp:  [16-36] 18 (12/25 2030) BP: (95-166)/(67-96) 117/69 (12/25 2030) SpO2:  [100 %] 100 % (12/25 2030) FiO2 (%):  [50 %] 50 % (12/25 1722) Weight:  [55.3 kg] 55.3 kg (12/25 1656)  PHYSICAL EXAMINATION: General: chronically ill appearing female, NAD on Bipap Neuro: alert and oriented, follows commands  HEENT: supple, no JVD  Cardiovascular: nsr, rrr, no R/G Lungs: diminished throughout, even, non labored  Abdomen: hypoactive BS x4, soft, tender, non distended  Musculoskeletal: normal bulk and tone, no edema  Skin: scattered ecchymosis   Recent Labs  Lab 09/09/18 1657  NA 140  K 3.8  CL 104  CO2 26  BUN 30*  CREATININE 0.93  GLUCOSE 102*   Recent Labs  Lab 09/09/18 1657  HGB 11.8*  HCT 38.2  WBC 14.7*  PLT 240   Dg Chest Portable 1 View  Result Date: 09/09/2018 CLINICAL DATA:  Stage IV COPD with dyspnea EXAM: PORTABLE CHEST 1 VIEW COMPARISON:  08/31/2018 FINDINGS: Mild pulmonary hyperinflation. Normal heart size and mediastinal contours with minimal aortic atherosclerosis at the arch. Vascular calcifications project over the right lung apex as well. No thoracic aortic aneurysm. No effusion nor pneumothorax.  No acute osseous abnormality. IMPRESSION: Mild pulmonary hyperinflation without active pulmonary disease. Electronically Signed   By: Tollie Ethavid  Kwon M.D.   On: 09/09/2018 17:14    ASSESSMENT / PLAN:  Acute on chronic respiratory failure secondary to AECOPD  Hx: Asthma, BOOP, and Lung Mass  Prn Bipap for dyspnea and/or  hypoxia Scheduled and prn bronchodilator therapy  IV and nebulized steroids  Influenza PCR results pending  Trend WBC and monitor fever curve Continue azithromycin   Hypertension  Hx: CHF, Myocardial Infarction, and Hypercholesterolemia  Continuous telemetry monitoring  Continue outpatient cardiac medications   Anemia without obvious acute blood loss  VTE px: subq lovenox Trend CBC  Monitor for s/sx of bleeding and transfuse for hgb <7  Anxiety Prn xanax   Sonda Rumbleana Ashtyn Freilich, AGNP  Pulmonary/Critical Care Pager [phone] (please enter 7 digits) PCCM Consult Pager [phone] (please enter 7 digits)

## 2018-09-09 NOTE — ED Notes (Signed)
Wasted Ativan 1mg  in waste container with Alberteen SpindleJohn RN (order was changed after pulling medication)

## 2018-09-09 NOTE — H&P (Addendum)
[facility] [place] [facility]   PATIENT NAME: Caitlyn Evans    MR#:  [id number]  DATE OF BIRTH:  19-Jan-1966  DATE OF ADMISSION:  09/09/2018  PRIMARY CARE PHYSICIAN: Corky DownsMasoud, Javed, MD   REQUESTING/REFERRING PHYSICIAN: Sharman CheekPhillip Stafford, MD  CHIEF COMPLAINT:   Chief Complaint  Patient presents with  . Shortness of Breath    HISTORY OF PRESENT ILLNESS:  Caitlyn Evans  is a 52 y.o. female with a known history of hypertension, hyperlipidemia, BOOP, diastolic heart failure, COPD who presented to the ED with shortness of breath over the last week.  He was recently admitted from 08/31/2018-09/02/2018 with a COPD exacerbation.  She states that she has had continued shortness of breath at home.  She was previously using oxygen only at night, but has had to use 3 L O2 continuously since discharge from the hospital.  She endorses cough productive of clear sputum.  She denies any fevers but endorses chills and "cold sweats".  Denies any chest pain.  In the ED, she was tachypneic to 25.  She was placed on BiPAP for increased work of breathing.  ABG with PCO2 49/PO2 35/bicarb 31.  WBC is elevated to 14.7.  Chest x-ray without any infiltrates.  Hospitalists were called for admission.  PAST MEDICAL HISTORY:   Past Medical History:  Diagnosis Date  . Anxiety    panic attacks  . Asthma   . BOOP (bronchiolitis obliterans with organizing pneumonia) (HCC)   . CHF (congestive heart failure) (HCC)   . Dysphagia, pharyngoesophageal phase 05/30/2015  . Emphysema lung (HCC) 05/30/2015  . Hypercholesterolemia   . Hypertension   . Lung mass   . Migraines   . Motion sickness    all moving vehicles  . Myocardial infarction (HCC) 2012  . Seasonal allergies   . Shortness of breath dyspnea    1 flight-stairs    PAST SURGICAL HISTORY:   Past Surgical History:  Procedure Laterality Date  . COLONOSCOPY WITH PROPOFOL N/A 06/02/2015   Procedure: COLONOSCOPY WITH PROPOFOL;   Surgeon: Midge Miniumarren Wohl, MD;  [facility];  Service: Endoscopy;  Laterality: N/A;  . ESOPHAGOGASTRODUODENOSCOPY (EGD) WITH PROPOFOL N/A 06/02/2015   Procedure: ESOPHAGOGASTRODUODENOSCOPY (EGD) WITH PROPOFOL withdialation;  Surgeon: Midge Miniumarren Wohl, MD;  [facility];  Service: Endoscopy;  Laterality: N/A;  . ESOPHAGOGASTRODUODENOSCOPY (EGD) WITH PROPOFOL N/A 11/05/2017   Procedure: ESOPHAGOGASTRODUODENOSCOPY (EGD) WITH PROPOFOL;  Surgeon: Pasty Spillersahiliani, Varnita B, MD;  Location: [facility] END[facility];  Service: Endoscopy;  Laterality: N/A;  . LUNG SURGERY Left    Upper lobe removed  . OOPHORECTOMY Left   . VAGINAL HYSTERECTOMY      SOCIAL HISTORY:   Social History   Tobacco Use  . Smoking status: Former Smoker    Packs/day: 1.00    Years: 30.00    Pack years: 30.00    Types: Cigarettes  . Smokeless tobacco: Never Used  . Tobacco comment: quit some time in 2012  Substance Use Topics  . Alcohol use: No    Alcohol/week: 0.0 standard drinks    Comment: rare consumption    FAMILY HISTORY:   Family History  Problem Relation Age of Onset  . COPD Mother   . Heart attack Mother   . Stroke Father   . COPD Father     DRUG ALLERGIES:   Allergies  Allergen Reactions  . Penicillins Hives and Other (See Comments)    Has patient had a PCN reaction causing immediate rash, facial/tongue/throat swelling, SOB  or lightheadedness with hypotension: No Has patient had a PCN reaction causing severe rash involving mucus membranes or skin necrosis: No Has patient had a PCN reaction that required hospitalization No Has patient had a PCN reaction occurring within the last 10 years: No If all of the above answers are "NO", then may proceed with Cephalosporin use. Other reaction(s): Other (See Comments) Has patient had a PCN reaction causing immediate rash, facial/tongue/throat swelling, SOB or lightheadedness with hypotension: No Has patient had a PCN reaction causing severe rash  involving mucus membranes or skin necrosis: No Has patient had a PCN reaction that required hospitalization No Has patient had a PCN reaction occurring within the last 10 years: No If all of the above answers are "NO", then may proceed with Cephalosporin use.  . Codeine Hives, Nausea And Vomiting and Nausea Only    REVIEW OF SYSTEMS:   Review of Systems  Constitutional: Positive for malaise/fatigue. Negative for chills and fever.  HENT: Negative for congestion and sore throat.   Eyes: Negative for blurred vision and double vision.  Respiratory: Positive for cough, sputum production, shortness of breath and wheezing.   Cardiovascular: Negative for chest pain, palpitations and leg swelling.  Gastrointestinal: Negative for abdominal pain, nausea and vomiting.  Genitourinary: Negative for dysuria and urgency.  Musculoskeletal: Negative for myalgias and neck pain.  Neurological: Negative for dizziness and headaches.  Psychiatric/Behavioral: Negative for depression. The patient is not nervous/anxious.     MEDICATIONS AT HOME:   Prior to Admission medications   Medication Sig Start Date End Date Taking? Authorizing Provider  ALPRAZolam Prudy Feeler(XANAX) 0.5 MG tablet Take 0.5 mg by mouth 3 (three) times daily.    Yes [provider]  diltiazem (CARDIZEM CD) 180 MG 24 hr capsule Take 1 capsule (180 mg total) by mouth daily. 11/26/17  Yes Enid BaasKalisetti, Radhika, MD  donepezil (ARICEPT) 10 MG tablet Take 10 mg by mouth daily.    Yes [provider]  furosemide (LASIX) 20 MG tablet Take 1 tablet (20 mg total) by mouth daily. 11/26/17  Yes Enid BaasKalisetti, Radhika, MD  ipratropium-albuterol (DUONEB) 0.5-2.5 (3) MG/3ML SOLN Inhale 3 mLs into the lungs every 4 (four) hours as needed (for wheezing/shortness of breath). Patient taking differently: Inhale 3 mLs into the lungs every 4 (four) hours as needed. For wheezing/shortness of breath 05/13/16  Yes Enid BaasKalisetti, Radhika, MD  metoprolol tartrate  (LOPRESSOR) 25 MG tablet Take 1 tablet (25 mg total) by mouth 2 (two) times daily. 11/25/17  Yes Enid BaasKalisetti, Radhika, MD  pantoprazole (PROTONIX) 40 MG tablet TAKE ONE TABLET BY MOUTH TWICE DAILY Patient taking differently: Take 40 mg by mouth 2 (two) times daily.  04/03/18  Yes Pasty Spillersahiliani, Varnita B, MD  predniSONE (DELTASONE) 10 MG tablet Take 1 tablet (10 mg total) by mouth 3 (three) times daily. Patient taking differently: Take 10 mg by mouth 2 (two) times daily with a meal.  11/28/17  Yes Enid BaasKalisetti, Radhika, MD  rosuvastatin (CRESTOR) 10 MG tablet Take 10 mg by mouth daily.  10/10/17  Yes [provider]  tamsulosin (FLOMAX) 0.4 MG CAPS capsule Take 1 capsule (0.4 mg total) by mouth daily. 11/26/17  Yes Enid BaasKalisetti, Radhika, MD      VITAL SIGNS:  Blood pressure 126/72, pulse 86, temperature 98.4 F (36.9 C), temperature source Oral, resp. rate 19, height 4\' 10"  (1.473 m), weight 55.3 kg, SpO2 100 %.  PHYSICAL EXAMINATION:  Physical Exam  GENERAL:  52 y.o.-year-old patient lying in the bed with no acute distress.  BiPAP in place. EYES: Pupils equal, round, reactive to light and accommodation. No scleral icterus. Extraocular muscles intact.  HEENT: Head atraumatic, normocephalic. Oropharynx and nasopharynx clear.  NECK:  Supple, no jugular venous distention. No thyroid enlargement, no tenderness.  LUNGS: + Diminished breath sounds in the lung bases bilaterally, diffuse expiratory wheezing throughout all lung fields, mildly increased work of breathing.  BiPAP in place. CARDIOVASCULAR: RRR, S1, S2 normal. No murmurs, rubs, or gallops.  ABDOMEN: Soft, nontender, nondistended. Bowel sounds present. No organomegaly or mass.  EXTREMITIES: No pedal edema, cyanosis, or clubbing.  NEUROLOGIC: Cranial nerves II through XII are intact. Muscle strength 5/5 in all extremities. Sensation intact. Gait not checked.  PSYCHIATRIC: The patient is alert and oriented x 3.  SKIN: No obvious rash, lesion, or  ulcer.   LABORATORY PANEL:   CBC Recent Labs  Lab 09/09/18 1657  WBC 14.7*  HGB 11.8*  HCT 38.2  PLT 240   ------------------------------------------------------------------------------------------------------------------  Chemistries  Recent Labs  Lab 09/09/18 1657  NA 140  K 3.8  CL 104  CO2 26  GLUCOSE 102*  BUN 30*  CREATININE 0.93  CALCIUM 8.8*   ------------------------------------------------------------------------------------------------------------------  Cardiac Enzymes Recent Labs  Lab 09/09/18 1657  TROPONINI <0.03   ------------------------------------------------------------------------------------------------------------------  RADIOLOGY:  Dg Chest Portable 1 View  Result Date: 09/09/2018 CLINICAL DATA:  Stage IV COPD with dyspnea EXAM: PORTABLE CHEST 1 VIEW COMPARISON:  08/31/2018 FINDINGS: Mild pulmonary hyperinflation. Normal heart size and mediastinal contours with minimal aortic atherosclerosis at the arch. Vascular calcifications project over the right lung apex as well. No thoracic aortic aneurysm. No effusion nor pneumothorax. No acute osseous abnormality. IMPRESSION: Mild pulmonary hyperinflation without active pulmonary disease. Electronically Signed   By: Tollie Eth M.D.   On: 09/09/2018 17:14      IMPRESSION AND PLAN:   Acute on chronic hypoxic respiratory failure- likely COPD exacerbation.  Requiring BiPAP in the ED for increased work of breathing. Uses 3 L O2 at home. ABG without hypercarbia. -Continue BiPAP, wean to nasal cannula as able -Continue IV steroids and nebulizers as needed -Continue home inhalers -Check procalcitonin and flu  Leukocytosis- likely related to recent steroid use. CXR without signs of infection. -Check procalcitonin -Recheck CXR in the morning -Hold on antibiotics for now  Hypertension- normotensive in the ED -Continue home metoprolol  Chronic diastolic heart failure-stable, no signs of volume  overload -Continue home Lasix and metoprolol  Anxiety- appears anxious in the ED -Continue home xanax  All the records are reviewed and case discussed with ED provider. Management plans discussed with the patient, family and they are in agreement.  CODE STATUS: Full  TOTAL TIME TAKING CARE OF THIS PATIENT: 45 minutes.    Jinny Blossom  M.D on 09/09/2018 at 7:51 PM  Between 7am to 6pm - Pager - [phone]  After 6pm go to [email or web address] - Social research officer, government  [facility] [place] [facility]  Office  [phone]  CC: Primary care physician; Corky Downs, MD   Note: This dictation was prepared with Dragon dictation along with smaller phrase technology. Any transcriptional errors that result from this process are unintentional.

## 2018-09-10 ENCOUNTER — Inpatient Hospital Stay: Payer: Medicare Other

## 2018-09-10 DIAGNOSIS — I10 Essential (primary) hypertension: Secondary | ICD-10-CM | POA: Diagnosis not present

## 2018-09-10 DIAGNOSIS — I110 Hypertensive heart disease with heart failure: Secondary | ICD-10-CM | POA: Diagnosis not present

## 2018-09-10 DIAGNOSIS — G300 Alzheimer's disease with early onset: Secondary | ICD-10-CM | POA: Diagnosis not present

## 2018-09-10 DIAGNOSIS — R0602 Shortness of breath: Secondary | ICD-10-CM | POA: Diagnosis not present

## 2018-09-10 DIAGNOSIS — D72829 Elevated white blood cell count, unspecified: Secondary | ICD-10-CM | POA: Diagnosis not present

## 2018-09-10 DIAGNOSIS — M549 Dorsalgia, unspecified: Secondary | ICD-10-CM | POA: Diagnosis not present

## 2018-09-10 DIAGNOSIS — J9621 Acute and chronic respiratory failure with hypoxia: Secondary | ICD-10-CM | POA: Diagnosis not present

## 2018-09-10 DIAGNOSIS — T380X5A Adverse effect of glucocorticoids and synthetic analogues, initial encounter: Secondary | ICD-10-CM | POA: Diagnosis not present

## 2018-09-10 DIAGNOSIS — D649 Anemia, unspecified: Secondary | ICD-10-CM | POA: Diagnosis not present

## 2018-09-10 DIAGNOSIS — I252 Old myocardial infarction: Secondary | ICD-10-CM | POA: Diagnosis not present

## 2018-09-10 DIAGNOSIS — E785 Hyperlipidemia, unspecified: Secondary | ICD-10-CM | POA: Diagnosis not present

## 2018-09-10 DIAGNOSIS — Z87891 Personal history of nicotine dependence: Secondary | ICD-10-CM | POA: Diagnosis not present

## 2018-09-10 DIAGNOSIS — Z880 Allergy status to penicillin: Secondary | ICD-10-CM | POA: Diagnosis not present

## 2018-09-10 DIAGNOSIS — G8929 Other chronic pain: Secondary | ICD-10-CM | POA: Diagnosis not present

## 2018-09-10 DIAGNOSIS — J441 Chronic obstructive pulmonary disease with (acute) exacerbation: Secondary | ICD-10-CM | POA: Diagnosis not present

## 2018-09-10 DIAGNOSIS — Z885 Allergy status to narcotic agent status: Secondary | ICD-10-CM | POA: Diagnosis not present

## 2018-09-10 DIAGNOSIS — I5032 Chronic diastolic (congestive) heart failure: Secondary | ICD-10-CM | POA: Diagnosis not present

## 2018-09-10 DIAGNOSIS — E7800 Pure hypercholesterolemia, unspecified: Secondary | ICD-10-CM | POA: Diagnosis not present

## 2018-09-10 LAB — CBC
HCT: 33.3 % — ABNORMAL LOW (ref 36.0–46.0)
Hemoglobin: 10.1 g/dL — ABNORMAL LOW (ref 12.0–15.0)
MCH: 26.4 pg (ref 26.0–34.0)
MCHC: 30.3 g/dL (ref 30.0–36.0)
MCV: 87.2 fL (ref 80.0–100.0)
Platelets: 208 10*3/uL (ref 150–400)
RBC: 3.82 MIL/uL — ABNORMAL LOW (ref 3.87–5.11)
RDW: 16.3 % — ABNORMAL HIGH (ref 11.5–15.5)
WBC: 10.8 10*3/uL — ABNORMAL HIGH (ref 4.0–10.5)
nRBC: 0 % (ref 0.0–0.2)

## 2018-09-10 LAB — BASIC METABOLIC PANEL
Anion gap: 7 (ref 5–15)
BUN: 27 mg/dL — ABNORMAL HIGH (ref 6–20)
CO2: 28 mmol/L (ref 22–32)
Calcium: 8.1 mg/dL — ABNORMAL LOW (ref 8.9–10.3)
Chloride: 105 mmol/L (ref 98–111)
Creatinine, Ser: 0.59 mg/dL (ref 0.44–1.00)
GFR calc Af Amer: >60 mL/min (ref >60)
GFR calc non Af Amer: >60 mL/min (ref >60)
Glucose, Bld: 131 mg/dL — ABNORMAL HIGH (ref 70–99)
POTASSIUM: 4.2 mmol/L (ref 3.5–5.1)
Sodium: 140 mmol/L (ref 135–145)

## 2018-09-10 LAB — PROCALCITONIN: Procalcitonin: <0.1 ng/mL

## 2018-09-10 MED ORDER — AZITHROMYCIN 250 MG PO TABS: 250.0000 mg | ORAL_TABLET | Freq: Every day | ORAL | 0 refills | Status: DC

## 2018-09-10 MED ORDER — METHYLPREDNISOLONE SODIUM SUCC 125 MG IJ SOLR
60.0000 mg | Freq: Three times a day (TID) | INTRAMUSCULAR | Status: DC
  Administered 2018-09-10: 14:00:00 via INTRAVENOUS
  Filled 2018-09-10: qty 2

## 2018-09-10 MED ORDER — PREDNISONE 10 MG PO TABS: 40.0000 mg | ORAL_TABLET | Freq: Every day | ORAL | Status: DC

## 2018-09-10 MED ORDER — TRAMADOL HCL 50 MG PO TABS: 50.0000 mg | ORAL_TABLET | Freq: Four times a day (QID) | ORAL | Status: DC | PRN

## 2018-09-10 MED ORDER — PREDNISONE 10 MG PO TABS: 40.0000 mg | ORAL_TABLET | Freq: Every day | ORAL | 0 refills | Status: DC

## 2018-09-10 NOTE — Progress Notes (Signed)
She is much improved this morning and comfortable on [place] O2.  Complaining of diffuse pain.  Asking for morphine.  I have ordered transfer to MedSurg floor.  After transfer, PCCM will sign off. Please call if we can be of further assistance   Billy Fischeravid Nanette Wirsing, MD PCCM service Mobile [phone] Pager [phone] 09/10/2018 1:04 PM

## 2018-09-10 NOTE — Discharge Instructions (Signed)
Advised to use oxygen during daytime and nighttime. Use inhalers nebulizers as before.

## 2018-09-10 NOTE — Progress Notes (Signed)
Patient arrived to CCU at approximately 2230 on BiPAP. Patient on BiPAP since arrival, VSS and in no distress. Will continue to monitor and assess.

## 2018-09-10 NOTE — Discharge Summary (Signed)
[facility] [place] [facility]   PATIENT NAME: Caitlyn Evans Bachus    MR#:  [id number]  DATE OF BIRTH:  1966/06/09  DATE OF ADMISSION:  09/09/2018 ADMITTING PHYSICIAN: Campbell StallKaty Dodd Mayo, MD  DATE OF DISCHARGE:09/10/2018  PRIMARY CARE PHYSICIAN: Corky DownsMasoud, Javed, MD    ADMISSION DIAGNOSIS:  Shortness of breath [R06.02] Anxiety [F41.9] COPD exacerbation (HCC) [J44.1]  DISCHARGE DIAGNOSIS:  acute on chronic rest although secondary to COPD exacerbation on chronic home oxygen chronic anxiety  SECONDARY DIAGNOSIS:   Past Medical History:  Diagnosis Date  . Anxiety    panic attacks  . Asthma   . BOOP (bronchiolitis obliterans with organizing pneumonia) (HCC)   . CHF (congestive heart failure) (HCC)   . Dysphagia, pharyngoesophageal phase 05/30/2015  . Emphysema lung (HCC) 05/30/2015  . Hypercholesterolemia   . Hypertension   . Lung mass   . Migraines   . Motion sickness    all moving vehicles  . Myocardial infarction (HCC) 2012  . Seasonal allergies   . Shortness of breath dyspnea    1 flight-stairs    HOSPITAL COURSE:   Caitlyn Evans Mennen  is a 52 y.o. female with a known history of hypertension, hyperlipidemia, BOOP, diastolic heart failure, COPD who presented to the ED with shortness of breath over the last week.  He was recently admitted from 08/31/2018-09/02/2018 with a COPD exacerbation.  *Acute on chronic hypoxic respiratory failure- likely COPD exacerbation.  Requiring BiPAP in the ED for increased work of breathing. Uses 3 L O2 at home. ABG without hypercarbia. -Now off BiPAP, wean to nasal cannula as able -Continue IV steroids and nebulizers as needed-- to oral steroid and oral antibiotic -Continue home inhalers -patient feels a lot better. Sats are hundred percent on 3 L nasal cannula oxygen. She is desperate to go home. She said she has her nebulizer, inhaler, and all her meds. -Upset with her daughter the other day and it was too hot with the  heater in her house which triggered her COPD exacerbation.  *Leukocytosis- likely related to recent steroid use. CXR without signs of infection.  procalcitonin negative-- DC antibiotics -Hold on antibiotics for now  *Hypertension- normotensive in the ED -Continue home metoprolol  *Chronic diastolic heart failure-stable, no signs of volume overload -Continue home Lasix and metoprolol  *Anxiety-  -Continue home xanax  *Chronic back pain. Patient is asking for narcotics. I have declined that.  She is anxious to go home. Will discharge to home since hemodynamically she appears at baseline. She is asked to with Dr. Harl BowieMassoud as outpatient. CONSULTS OBTAINED:    DRUG ALLERGIES:   Allergies  Allergen Reactions  . Penicillins Hives and Other (See Comments)    Has patient had a PCN reaction causing immediate rash, facial/tongue/throat swelling, SOB or lightheadedness with hypotension: No Has patient had a PCN reaction causing severe rash involving mucus membranes or skin necrosis: No Has patient had a PCN reaction that required hospitalization No Has patient had a PCN reaction occurring within the last 10 years: No If all of the above answers are "NO", then may proceed with Cephalosporin use. Other reaction(s): Other (See Comments) Has patient had a PCN reaction causing immediate rash, facial/tongue/throat swelling, SOB or lightheadedness with hypotension: No Has patient had a PCN reaction causing severe rash involving mucus membranes or skin necrosis: No Has patient had a PCN reaction that required hospitalization No Has patient had a PCN reaction occurring within the last 10 years: No If all of the  above answers are "NO", then may proceed with Cephalosporin use.  . Codeine Hives, Nausea And Vomiting and Nausea Only    DISCHARGE MEDICATIONS:   Allergies as of 09/10/2018      Reactions   Penicillins Hives, Other (See Comments)   Has patient had a PCN reaction causing immediate  rash, facial/tongue/throat swelling, SOB or lightheadedness with hypotension: No Has patient had a PCN reaction causing severe rash involving mucus membranes or skin necrosis: No Has patient had a PCN reaction that required hospitalization No Has patient had a PCN reaction occurring within the last 10 years: No If all of the above answers are "NO", then may proceed with Cephalosporin use. Other reaction(s): Other (See Comments) Has patient had a PCN reaction causing immediate rash, facial/tongue/throat swelling, SOB or lightheadedness with hypotension: No Has patient had a PCN reaction causing severe rash involving mucus membranes or skin necrosis: No Has patient had a PCN reaction that required hospitalization No Has patient had a PCN reaction occurring within the last 10 years: No If all of the above answers are "NO", then may proceed with Cephalosporin use.   Codeine Hives, Nausea And Vomiting, Nausea Only      Medication List    TAKE these medications   ALPRAZolam 0.5 MG tablet Commonly known as:  XANAX Take 0.5 mg by mouth 3 (three) times daily.   diltiazem 180 MG 24 hr capsule Commonly known as:  CARDIZEM CD Take 1 capsule (180 mg total) by mouth daily.   donepezil 10 MG tablet Commonly known as:  ARICEPT Take 10 mg by mouth daily.   furosemide 20 MG tablet Commonly known as:  LASIX Take 1 tablet (20 mg total) by mouth daily.   ipratropium-albuterol 0.5-2.5 (3) MG/3ML Soln Commonly known as:  DUONEB Inhale 3 mLs into the lungs every 4 (four) hours as needed (for wheezing/shortness of breath). What changed:    reasons to take this  additional instructions   metoprolol tartrate 25 MG tablet Commonly known as:  LOPRESSOR Take 1 tablet (25 mg total) by mouth 2 (two) times daily.   pantoprazole 40 MG tablet Commonly known as:  PROTONIX TAKE ONE TABLET BY MOUTH TWICE DAILY   predniSONE 10 MG tablet Commonly known as:  DELTASONE Take 4 tablets (40 mg total) by mouth  daily with breakfast. Taper by 10 mg then stop Start taking on:  September 11, 2018 What changed:    how much to take  when to take this  additional instructions   rosuvastatin 10 MG tablet Commonly known as:  CRESTOR Take 10 mg by mouth daily.   tamsulosin 0.4 MG Caps capsule Commonly known as:  FLOMAX Take 1 capsule (0.4 mg total) by mouth daily.       If you experience worsening of your admission symptoms, develop shortness of breath, life threatening emergency, suicidal or homicidal thoughts you must seek medical attention immediately by calling 911 or calling your MD immediately  if symptoms less severe.  You Must read complete instructions/literature along with all the possible adverse reactions/side effects for all the Medicines you take and that have been prescribed to you. Take any new Medicines after you have completely understood and accept all the possible adverse reactions/side effects.   Please note  You were cared for by a hospitalist during your hospital stay. If you have any questions about your discharge medications or the care you received while you were in the hospital after you are discharged, you can call the unit  and asked to speak with the hospitalist on call if the hospitalist that took care of you is not available. Once you are discharged, your primary care physician will handle any further medical issues. Please note that NO REFILLS for any discharge medications will be authorized once you are discharged, as it is imperative that you return to your primary care physician (or establish a relationship with a primary care physician if you do not have one) for your aftercare needs so that they can reassess your need for medications and monitor your lab values. Today   SUBJECTIVE   Overall improved. Anxious. She wants to go home.  VITAL SIGNS:  Blood pressure (!) 143/75, pulse 84, temperature 98.6 F (37 C), resp. rate (!) 27, height 4\' 10"  (1.473 m), weight  55.3 kg, SpO2 95 %.  I/O:    Intake/Output Summary (Last 24 hours) at 09/10/2018 1351 Last data filed at 09/10/2018 1300 Gross per 24 hour  Intake 270 ml  Output 950 ml  Net -680 ml    PHYSICAL EXAMINATION:  GENERAL:  52 y.o.-year-old patient lying in the bed with no acute distress. Chronically ill EYES: Pupils equal, round, reactive to light and accommodation. No scleral icterus. Extraocular muscles intact.  HEENT: Head atraumatic, normocephalic. Oropharynx and nasopharynx clear.  NECK:  Supple, no jugular venous distention. No thyroid enlargement, no tenderness.  LUNGS: coarse breath sounds bilaterally, no wheezing, rales,rhonchi or crepitation. No use of accessory muscles of respiration.  CARDIOVASCULAR: S1, S2 normal. No murmurs, rubs, or gallops.  ABDOMEN: Soft, non-tender, non-distended. Bowel sounds present. No organomegaly or mass.  EXTREMITIES: No pedal edema, cyanosis, or clubbing.  NEUROLOGIC: Cranial nerves II through XII are intact. Muscle strength 5/5 in all extremities. Sensation intact. Gait not checked.  PSYCHIATRIC: The patient is alert and oriented x 3. Anxious SKIN: No obvious rash, lesion, or ulcer.   DATA REVIEW:   CBC  Recent Labs  Lab 09/10/18 0412  WBC 10.8*  HGB 10.1*  HCT 33.3*  PLT 208    Chemistries  Recent Labs  Lab 09/10/18 0412  NA 140  K 4.2  CL 105  CO2 28  GLUCOSE 131*  BUN 27*  CREATININE 0.59  CALCIUM 8.1*    Microbiology Results   Recent Results (from the past 240 hour(s))  MRSA PCR Screening     Status: None   Collection Time: 08/31/18  3:13 PM  Result Value Ref Range Status   MRSA by PCR NEGATIVE NEGATIVE Final    Comment:        The GeneXpert MRSA Assay (FDA approved for NASAL specimens only), is one component of a comprehensive MRSA colonization surveillance program. It is not intended to diagnose MRSA infection nor to guide or monitor treatment for MRSA infections. Performed at [facility], [address] [place], [place] [address]   MRSA PCR Screening     Status: None   Collection Time: 09/09/18  9:21 PM  Result Value Ref Range Status   MRSA by PCR NEGATIVE NEGATIVE Final    Comment:        The GeneXpert MRSA Assay (FDA approved for NASAL specimens only), is one component of a comprehensive MRSA colonization surveillance program. It is not intended to diagnose MRSA infection nor to guide or monitor treatment for MRSA infections. Performed at [facility], [address] [place] [place], [place] [address]     RADIOLOGY:  Dg Chest Port 1 View  Result Date: 09/10/2018 CLINICAL DATA:  Acute onset  of shortness of breath. EXAM: PORTABLE CHEST 1 VIEW COMPARISON:  Chest radiograph 09/09/2018 FINDINGS: The lungs are well-aerated and clear. There is no evidence of focal opacification, pleural effusion or pneumothorax. The lung apices are partially obscured by the patient's head. The cardiomediastinal silhouette is within normal limits. No acute osseous abnormalities are seen. IMPRESSION: No acute cardiopulmonary process seen. Electronically Signed   By: Roanna RaiderJeffery  Chang M.D.   On: 09/10/2018 05:01   Dg Chest Portable 1 View  Result Date: 09/09/2018 CLINICAL DATA:  Stage IV COPD with dyspnea EXAM: PORTABLE CHEST 1 VIEW COMPARISON:  08/31/2018 FINDINGS: Mild pulmonary hyperinflation. Normal heart size and mediastinal contours with minimal aortic atherosclerosis at the arch. Vascular calcifications project over the right lung apex as well. No thoracic aortic aneurysm. No effusion nor pneumothorax. No acute osseous abnormality. IMPRESSION: Mild pulmonary hyperinflation without active pulmonary disease. Electronically Signed   By: Tollie Ethavid  Kwon M.D.   On: 09/09/2018 17:14     CODE STATUS:     Code Status Orders  (From admission, onward)         Start     Ordered   09/09/18 2114  Full code  Continuous     09/09/18 2113        Code Status History    Date Active Date Inactive  Code Status Order ID Comments User Context   08/31/2018 1507 09/02/2018 1509 Full Code [id number]  Ihor AustinPyreddy, Pavan, MD ED   11/18/2017 2000 11/25/2017 1802 Full Code [id number]  Ihor AustinPyreddy, Pavan, MD ED   07/01/2017 2023 07/04/2017 1648 Full Code [id number]  Altamese DillingVachhani, Vaibhavkumar, MD ED   07/19/2016 1148 07/20/2016 2010 Full Code [id number]  Adrian SaranMody, Sital, MD Inpatient   05/09/2016 1440 05/13/2016 1437 Full Code [id number]  Merwyn KatosSimonds, David B, MD ED   02/05/2016 1914 02/08/2016 1452 Full Code [id number]  Ramonita LabGouru, Aruna, MD Inpatient   12/05/2015 2302 12/09/2015 1535 Full Code [id number]  Oralia ManisWillis, David, MD Inpatient    Advance Directive Documentation     Most Recent Value  Type of Advance Directive  Healthcare Power of Attorney  Pre-existing out of facility DNR order (yellow form or pink MOST form)  -  "MOST" Form in Place?  -      TOTAL TIME TAKING CARE OF THIS PATIENT: *40* minutes.    Enedina FinnerSona Davier Tramell M.D on 09/10/2018 at 1:51 PM  Between 7am to 6pm - Pager - [phone] After 6pm go to [email or web address] - Social research officer, [email or web address] [email or web address]  [facility]  Office  [phone]  CC: Primary care physician; Corky DownsMasoud, Javed, MD

## 2018-09-10 NOTE — Care Management Note (Signed)
Case Management Note  Patient Details  Name: Caitlyn Evans MRN: [id number] Date of Birth: July 24, 1966  Subjective/Objective: Patient admitted with shortness of breath and COPD exacerbation.  Patient is being discharged home with self care.  [facility]Patel [facility] to assess for any needs.  Patient has chronic O2 with [facility], she does not want any home health services and patient reports that she has assistance at home if needed.  Patients mother in law is here to drive her home.   No needs identified. Caitlyn LisJeanna Teghan Philbin RN BSN [phone]                  Action/Plan: Discharge home with self care.  Expected Discharge Date:  09/10/18               Expected Discharge Plan:  Home/Self Care  In-House Referral:     Discharge planning Services  CM Consult  Post Acute Care Choice:    Choice offered to:     DME Arranged:    DME Agency:     HH Arranged:    HH Agency:     Status of Service:  Completed, signed off  If discussed at [employer] Length of Stay Meetings, dates discussed:    Additional Comments:  Caitlyn ButcherJeanna M Treshun Wold, RN 09/10/2018, 2:58 PM

## 2018-09-10 NOTE — Progress Notes (Signed)
1445 Discharged home with mother in law per Dr. Allena KatzPatel.

## 2018-09-13 ENCOUNTER — Other Ambulatory Visit: Payer: Self-pay

## 2018-09-13 ENCOUNTER — Inpatient Hospital Stay: Payer: Medicare Other

## 2018-09-13 ENCOUNTER — Encounter: Payer: Self-pay | Admitting: Emergency Medicine

## 2018-09-13 ENCOUNTER — Emergency Department: Payer: Medicare Other

## 2018-09-13 ENCOUNTER — Inpatient Hospital Stay
Admission: EM | Admit: 2018-09-13 | Discharge: 2018-09-15 | DRG: 189 | Disposition: A | Discharge status: Home / Self Care | Payer: Medicare Other | Attending: Internal Medicine | Admitting: Internal Medicine

## 2018-09-13 DIAGNOSIS — K219 Gastro-esophageal reflux disease without esophagitis: Secondary | ICD-10-CM | POA: Diagnosis present

## 2018-09-13 DIAGNOSIS — J441 Chronic obstructive pulmonary disease with (acute) exacerbation: Secondary | ICD-10-CM | POA: Diagnosis present

## 2018-09-13 DIAGNOSIS — Z79899 Other long term (current) drug therapy: Secondary | ICD-10-CM

## 2018-09-13 DIAGNOSIS — J9621 Acute and chronic respiratory failure with hypoxia: Secondary | ICD-10-CM | POA: Diagnosis not present

## 2018-09-13 DIAGNOSIS — E785 Hyperlipidemia, unspecified: Secondary | ICD-10-CM | POA: Diagnosis present

## 2018-09-13 DIAGNOSIS — Z87891 Personal history of nicotine dependence: Secondary | ICD-10-CM

## 2018-09-13 DIAGNOSIS — I10 Essential (primary) hypertension: Secondary | ICD-10-CM | POA: Diagnosis not present

## 2018-09-13 DIAGNOSIS — Z9981 Dependence on supplemental oxygen: Secondary | ICD-10-CM

## 2018-09-13 DIAGNOSIS — E7800 Pure hypercholesterolemia, unspecified: Secondary | ICD-10-CM | POA: Diagnosis not present

## 2018-09-13 DIAGNOSIS — M62830 Muscle spasm of back: Secondary | ICD-10-CM | POA: Diagnosis not present

## 2018-09-13 DIAGNOSIS — I110 Hypertensive heart disease with heart failure: Secondary | ICD-10-CM | POA: Diagnosis present

## 2018-09-13 DIAGNOSIS — Z885 Allergy status to narcotic agent status: Secondary | ICD-10-CM

## 2018-09-13 DIAGNOSIS — R0603 Acute respiratory distress: Secondary | ICD-10-CM | POA: Diagnosis not present

## 2018-09-13 DIAGNOSIS — R0602 Shortness of breath: Secondary | ICD-10-CM | POA: Diagnosis not present

## 2018-09-13 DIAGNOSIS — I5032 Chronic diastolic (congestive) heart failure: Secondary | ICD-10-CM | POA: Diagnosis present

## 2018-09-13 DIAGNOSIS — Z90710 Acquired absence of both cervix and uterus: Secondary | ICD-10-CM | POA: Diagnosis not present

## 2018-09-13 DIAGNOSIS — F410 Panic disorder [episodic paroxysmal anxiety] without agoraphobia: Secondary | ICD-10-CM | POA: Diagnosis present

## 2018-09-13 DIAGNOSIS — Z880 Allergy status to penicillin: Secondary | ICD-10-CM | POA: Diagnosis not present

## 2018-09-13 DIAGNOSIS — J9601 Acute respiratory failure with hypoxia: Secondary | ICD-10-CM | POA: Diagnosis not present

## 2018-09-13 DIAGNOSIS — J9622 Acute and chronic respiratory failure with hypercapnia: Secondary | ICD-10-CM | POA: Diagnosis present

## 2018-09-13 DIAGNOSIS — Z7952 Long term (current) use of systemic steroids: Secondary | ICD-10-CM

## 2018-09-13 DIAGNOSIS — Z8249 Family history of ischemic heart disease and other diseases of the circulatory system: Secondary | ICD-10-CM

## 2018-09-13 DIAGNOSIS — F411 Generalized anxiety disorder: Secondary | ICD-10-CM | POA: Diagnosis present

## 2018-09-13 DIAGNOSIS — I252 Old myocardial infarction: Secondary | ICD-10-CM | POA: Diagnosis not present

## 2018-09-13 DIAGNOSIS — M546 Pain in thoracic spine: Secondary | ICD-10-CM | POA: Diagnosis not present

## 2018-09-13 DIAGNOSIS — S22050A Wedge compression fracture of T5-T6 vertebra, initial encounter for closed fracture: Secondary | ICD-10-CM | POA: Diagnosis not present

## 2018-09-13 DIAGNOSIS — M4854XA Collapsed vertebra, not elsewhere classified, thoracic region, initial encounter for fracture: Secondary | ICD-10-CM | POA: Diagnosis present

## 2018-09-13 DIAGNOSIS — M8008XA Age-related osteoporosis with current pathological fracture, vertebra(e), initial encounter for fracture: Secondary | ICD-10-CM | POA: Diagnosis not present

## 2018-09-13 DIAGNOSIS — Z825 Family history of asthma and other chronic lower respiratory diseases: Secondary | ICD-10-CM | POA: Diagnosis not present

## 2018-09-13 DIAGNOSIS — Z823 Family history of stroke: Secondary | ICD-10-CM | POA: Diagnosis not present

## 2018-09-13 DIAGNOSIS — M549 Dorsalgia, unspecified: Secondary | ICD-10-CM

## 2018-09-13 LAB — RESPIRATORY PANEL BY PCR
Adenovirus: NOT DETECTED
BORDETELLA PERTUSSIS-RVPCR: NOT DETECTED
CORONAVIRUS 229E-RVPPCR: NOT DETECTED
Chlamydophila pneumoniae: NOT DETECTED
Coronavirus HKU1: NOT DETECTED
Coronavirus NL63: NOT DETECTED
Coronavirus OC43: NOT DETECTED
Influenza A: NOT DETECTED
Influenza B: NOT DETECTED
Metapneumovirus: NOT DETECTED
Mycoplasma pneumoniae: NOT DETECTED
Parainfluenza Virus 1: NOT DETECTED
Parainfluenza Virus 2: NOT DETECTED
Parainfluenza Virus 3: NOT DETECTED
Parainfluenza Virus 4: NOT DETECTED
Respiratory Syncytial Virus: NOT DETECTED
Rhinovirus / Enterovirus: NOT DETECTED

## 2018-09-13 LAB — TROPONIN I: Troponin I: <0.03 ng/mL (ref <0.03)

## 2018-09-13 LAB — CBC WITH DIFFERENTIAL/PLATELET
Abs Immature Granulocytes: 0.18 10*3/uL — ABNORMAL HIGH (ref 0.00–0.07)
Basophils Absolute: 0 10*3/uL (ref 0.0–0.1)
Basophils Relative: 0 %
Eosinophils Absolute: 0 10*3/uL (ref 0.0–0.5)
Eosinophils Relative: 0 %
HCT: 41.5 % (ref 36.0–46.0)
Hemoglobin: 12.3 g/dL (ref 12.0–15.0)
Immature Granulocytes: 1 %
LYMPHS PCT: 4 %
Lymphs Abs: 0.6 10*3/uL — ABNORMAL LOW (ref 0.7–4.0)
MCH: 26.3 pg (ref 26.0–34.0)
MCHC: 29.6 g/dL — ABNORMAL LOW (ref 30.0–36.0)
MCV: 88.9 fL (ref 80.0–100.0)
Monocytes Absolute: 0.9 10*3/uL (ref 0.1–1.0)
Monocytes Relative: 6 %
NEUTROS ABS: 12.3 10*3/uL — AB (ref 1.7–7.7)
Neutrophils Relative %: 89 %
Platelets: 238 10*3/uL (ref 150–400)
RBC: 4.67 MIL/uL (ref 3.87–5.11)
RDW: 16.4 % — ABNORMAL HIGH (ref 11.5–15.5)
WBC: 14 10*3/uL — ABNORMAL HIGH (ref 4.0–10.5)
nRBC: 0 % (ref 0.0–0.2)

## 2018-09-13 LAB — INFLUENZA PANEL BY PCR (TYPE A & B)
Influenza A By PCR: NEGATIVE
Influenza B By PCR: NEGATIVE

## 2018-09-13 LAB — BASIC METABOLIC PANEL
Anion gap: 9 (ref 5–15)
BUN: 19 mg/dL (ref 6–20)
CHLORIDE: 104 mmol/L (ref 98–111)
CO2: 28 mmol/L (ref 22–32)
Calcium: 8.6 mg/dL — ABNORMAL LOW (ref 8.9–10.3)
Creatinine, Ser: 0.67 mg/dL (ref 0.44–1.00)
GFR calc Af Amer: >60 mL/min (ref >60)
GFR calc non Af Amer: >60 mL/min (ref >60)
Glucose, Bld: 86 mg/dL (ref 70–99)
POTASSIUM: 4.1 mmol/L (ref 3.5–5.1)
Sodium: 141 mmol/L (ref 135–145)

## 2018-09-13 LAB — BLOOD GAS, VENOUS
Acid-Base Excess: 4.5 mmol/L — ABNORMAL HIGH (ref 0.0–2.0)
Bicarbonate: 31.2 mmol/L — ABNORMAL HIGH (ref 20.0–28.0)
O2 Saturation: 25 %
PH VEN: 7.37 (ref 7.250–7.430)
Patient temperature: 37
pCO2, Ven: 54 mmHg (ref 44.0–60.0)

## 2018-09-13 LAB — GLUCOSE, CAPILLARY
Glucose-Capillary: 153 mg/dL — ABNORMAL HIGH (ref 70–99)
Glucose-Capillary: 132 mg/dL — ABNORMAL HIGH (ref 70–99)

## 2018-09-13 LAB — BRAIN NATRIURETIC PEPTIDE: B NATRIURETIC PEPTIDE 5: 58 pg/mL (ref 0.0–100.0)

## 2018-09-13 MED ORDER — MORPHINE SULFATE (PF) 2 MG/ML IV SOLN
2.0000 mg | INTRAVENOUS | Status: DC | PRN
  Administered 2018-09-13 – 2018-09-14 (×3): 2 mg via INTRAVENOUS
  Filled 2018-09-13 (×3): qty 1

## 2018-09-13 MED ORDER — METHYLPREDNISOLONE SODIUM SUCC 40 MG IJ SOLR
40.0000 mg | Freq: Two times a day (BID) | INTRAMUSCULAR | Status: DC
  Administered 2018-09-14: 40 mg via INTRAVENOUS
  Filled 2018-09-13: qty 1

## 2018-09-13 MED ORDER — IPRATROPIUM BROMIDE 0.02 % IN SOLN
RESPIRATORY_TRACT | Status: AC
  Filled 2018-09-13: qty 2.5

## 2018-09-13 MED ORDER — METHYLPREDNISOLONE SODIUM SUCC 125 MG IJ SOLR
INTRAMUSCULAR | Status: AC
  Filled 2018-09-13: qty 2

## 2018-09-13 MED ORDER — KETOROLAC TROMETHAMINE 15 MG/ML IJ SOLN
15.0000 mg | Freq: Once | INTRAMUSCULAR | Status: AC
  Administered 2018-09-13: 15 mg via INTRAVENOUS
  Filled 2018-09-13: qty 1

## 2018-09-13 MED ORDER — PANTOPRAZOLE SODIUM 40 MG PO TBEC
40.0000 mg | DELAYED_RELEASE_TABLET | Freq: Two times a day (BID) | ORAL | Status: DC
  Administered 2018-09-13 – 2018-09-15 (×4): 40 mg via ORAL
  Filled 2018-09-13 (×5): qty 1

## 2018-09-13 MED ORDER — ACETAMINOPHEN 650 MG RE SUPP: 650.0000 mg | Freq: Four times a day (QID) | RECTAL | Status: DC | PRN

## 2018-09-13 MED ORDER — DONEPEZIL HCL 5 MG PO TABS
10.0000 mg | ORAL_TABLET | Freq: Every day | ORAL | Status: DC
  Administered 2018-09-14 – 2018-09-15 (×2): 10 mg via ORAL
  Filled 2018-09-13 (×2): qty 2

## 2018-09-13 MED ORDER — ACETAMINOPHEN 325 MG PO TABS
650.0000 mg | ORAL_TABLET | Freq: Four times a day (QID) | ORAL | Status: DC | PRN
  Filled 2018-09-13: qty 2

## 2018-09-13 MED ORDER — FUROSEMIDE 20 MG PO TABS
20.0000 mg | ORAL_TABLET | Freq: Every day | ORAL | Status: DC
  Administered 2018-09-14 – 2018-09-15 (×2): 20 mg via ORAL
  Filled 2018-09-13 (×2): qty 1

## 2018-09-13 MED ORDER — CHLORHEXIDINE GLUCONATE 0.12 % MT SOLN
15.0000 mL | Freq: Two times a day (BID) | OROMUCOSAL | Status: DC
  Administered 2018-09-13 – 2018-09-15 (×3): 15 mL via OROMUCOSAL
  Filled 2018-09-13 (×2): qty 15

## 2018-09-13 MED ORDER — ROSUVASTATIN CALCIUM 10 MG PO TABS
10.0000 mg | ORAL_TABLET | Freq: Every day | ORAL | Status: DC
  Administered 2018-09-14 – 2018-09-15 (×2): 10 mg via ORAL
  Filled 2018-09-13 (×2): qty 1

## 2018-09-13 MED ORDER — METHYLPREDNISOLONE SODIUM SUCC 125 MG IJ SOLR
125.0000 mg | Freq: Once | INTRAMUSCULAR | Status: AC
  Administered 2018-09-13: 125 mg via INTRAVENOUS

## 2018-09-13 MED ORDER — ORAL CARE MOUTH RINSE: 15.0000 mL | Freq: Two times a day (BID) | OROMUCOSAL | Status: DC

## 2018-09-13 MED ORDER — LORAZEPAM 2 MG/ML IJ SOLN
1.0000 mg | Freq: Once | INTRAMUSCULAR | Status: AC
  Administered 2018-09-13: 1 mg via INTRAVENOUS
  Filled 2018-09-13: qty 1

## 2018-09-13 MED ORDER — DILTIAZEM HCL ER COATED BEADS 180 MG PO CP24
180.0000 mg | ORAL_CAPSULE | Freq: Every day | ORAL | Status: DC
  Administered 2018-09-14 – 2018-09-15 (×2): 180 mg via ORAL
  Filled 2018-09-13 (×2): qty 1

## 2018-09-13 MED ORDER — IPRATROPIUM-ALBUTEROL 0.5-2.5 (3) MG/3ML IN SOLN
3.0000 mL | Freq: Four times a day (QID) | RESPIRATORY_TRACT | Status: DC
  Administered 2018-09-13 – 2018-09-15 (×8): 3 mL via RESPIRATORY_TRACT
  Filled 2018-09-13 (×8): qty 3

## 2018-09-13 MED ORDER — ORAL CARE MOUTH RINSE
15.0000 mL | Freq: Two times a day (BID) | OROMUCOSAL | Status: DC
  Administered 2018-09-13 – 2018-09-14 (×2): 15 mL via OROMUCOSAL

## 2018-09-13 MED ORDER — ACETAMINOPHEN 325 MG PO TABS
650.0000 mg | ORAL_TABLET | Freq: Once | ORAL | Status: DC
  Filled 2018-09-13: qty 2

## 2018-09-13 MED ORDER — BUDESONIDE 0.5 MG/2ML IN SUSP
0.5000 mg | Freq: Two times a day (BID) | RESPIRATORY_TRACT | Status: DC
  Administered 2018-09-13 – 2018-09-15 (×4): 0.5 mg via RESPIRATORY_TRACT
  Filled 2018-09-13 (×4): qty 2

## 2018-09-13 MED ORDER — KETOROLAC TROMETHAMINE 15 MG/ML IJ SOLN
15.0000 mg | Freq: Four times a day (QID) | INTRAMUSCULAR | Status: DC | PRN
  Administered 2018-09-13: 15 mg via INTRAVENOUS
  Filled 2018-09-13: qty 1

## 2018-09-13 MED ORDER — ALBUTEROL SULFATE (2.5 MG/3ML) 0.083% IN NEBU
5.0000 mg | INHALATION_SOLUTION | Freq: Once | RESPIRATORY_TRACT | Status: AC
  Administered 2018-09-13: 5 mg via RESPIRATORY_TRACT
  Filled 2018-09-13: qty 6

## 2018-09-13 MED ORDER — ONDANSETRON HCL 4 MG/2ML IJ SOLN: 4.0000 mg | Freq: Four times a day (QID) | INTRAMUSCULAR | Status: DC | PRN

## 2018-09-13 MED ORDER — IPRATROPIUM BROMIDE 0.02 % IN SOLN
0.5000 mg | Freq: Once | RESPIRATORY_TRACT | Status: AC
  Administered 2018-09-13: 0.5 mg via RESPIRATORY_TRACT

## 2018-09-13 MED ORDER — ALPRAZOLAM 0.5 MG PO TABS
0.5000 mg | ORAL_TABLET | Freq: Three times a day (TID) | ORAL | Status: DC
  Administered 2018-09-13 – 2018-09-15 (×6): 0.5 mg via ORAL
  Filled 2018-09-13 (×6): qty 1

## 2018-09-13 MED ORDER — ENOXAPARIN SODIUM 40 MG/0.4ML ~~LOC~~ SOLN
40.0000 mg | SUBCUTANEOUS | Status: DC
  Filled 2018-09-13: qty 0.4

## 2018-09-13 MED ORDER — ALBUTEROL SULFATE (2.5 MG/3ML) 0.083% IN NEBU
INHALATION_SOLUTION | RESPIRATORY_TRACT | Status: AC
  Filled 2018-09-13: qty 6

## 2018-09-13 MED ORDER — IPRATROPIUM-ALBUTEROL 0.5-2.5 (3) MG/3ML IN SOLN
6.0000 mL | Freq: Once | RESPIRATORY_TRACT | Status: AC
  Administered 2018-09-13: 6 mL via RESPIRATORY_TRACT

## 2018-09-13 MED ORDER — ONDANSETRON HCL 4 MG PO TABS: 4.0000 mg | ORAL_TABLET | Freq: Four times a day (QID) | ORAL | Status: DC | PRN

## 2018-09-13 MED ORDER — METOPROLOL TARTRATE 25 MG PO TABS
25.0000 mg | ORAL_TABLET | Freq: Two times a day (BID) | ORAL | Status: DC
  Administered 2018-09-13 – 2018-09-15 (×4): 25 mg via ORAL
  Filled 2018-09-13 (×4): qty 1

## 2018-09-13 NOTE — Progress Notes (Signed)
eLink Physician-Brief Progress Note Patient Name: Caitlyn Evans M Camilli DOB: 01-Dec-1965 MRN: [id number]   Date of Service  09/13/2018  HPI/Events of Note  Patient complaining of back pain and states toradol not working. Patient states morphine works and bedside nurse confirms she has tolerated received it before on previous admissions.  eICU Interventions  Morphine 2 mg IV q 4 prn for severe pain     Intervention Category Intermediate Interventions: Pain - evaluation and management  Darl Pikesmily T Monserratt Knezevic 09/13/2018, 9:12 PM

## 2018-09-13 NOTE — H&P (Signed)
[facility] [place] at [facility]   PATIENT NAME: Caitlyn Evans Fyock    MR#:  [id number]  DATE OF BIRTH:  [birth year]  DATE OF ADMISSION:  09/13/2018  PRIMARY CARE PHYSICIAN: Corky DownsMasoud, Javed, MD   REQUESTING/REFERRING PHYSICIAN: Dr Ileana RoupJames McShane  CHIEF COMPLAINT:   Chief Complaint  Patient presents with  . Respiratory Distress    HISTORY OF PRESENT ILLNESS:  Caitlyn Evans Freer  is a 52 y.o. female with a known history of COPD and CHF presents with respiratory distress.  She states she was recently in the hospital and sent home for COPD exacerbation.  She states that her dog recently died and she is very upset.  She states that she cannot breathe for the last 2 days.  She has been having severe back spasms and back pain.  She was asking for pain medication and muscle relaxant.  The patient was placed on BiPAP in the emergency room and hospitalist services were contacted for further evaluation.  PAST MEDICAL HISTORY:   Past Medical History:  Diagnosis Date  . Anxiety    panic attacks  . Asthma   . BOOP (bronchiolitis obliterans with organizing pneumonia) (HCC)   . CHF (congestive heart failure) (HCC)   . Dysphagia, pharyngoesophageal phase 05/30/2015  . Emphysema lung (HCC) 05/30/2015  . Hypercholesterolemia   . Hypertension   . Lung mass   . Migraines   . Motion sickness    all moving vehicles  . Myocardial infarction (HCC) 2012  . Seasonal allergies   . Shortness of breath dyspnea    1 flight-stairs    PAST SURGICAL HISTORY:   Past Surgical History:  Procedure Laterality Date  . COLONOSCOPY WITH PROPOFOL N/A 06/02/2015   Procedure: COLONOSCOPY WITH PROPOFOL;  Surgeon: Midge Miniumarren Wohl, MD;  [facility];  Service: Endoscopy;  Laterality: N/A;  . ESOPHAGOGASTRODUODENOSCOPY (EGD) WITH PROPOFOL N/A 06/02/2015   Procedure: ESOPHAGOGASTRODUODENOSCOPY (EGD) WITH PROPOFOL withdialation;  Surgeon: Midge Miniumarren Wohl, MD;  [facility];   Service: Endoscopy;  Laterality: N/A;  . ESOPHAGOGASTRODUODENOSCOPY (EGD) WITH PROPOFOL N/A 11/05/2017   Procedure: ESOPHAGOGASTRODUODENOSCOPY (EGD) WITH PROPOFOL;  Surgeon: Pasty Spillersahiliani, Varnita B, MD;  Location: [facility];  Service: Endoscopy;  Laterality: N/A;  . LUNG SURGERY Left    Upper lobe removed  . OOPHORECTOMY Left   . VAGINAL HYSTERECTOMY      SOCIAL HISTORY:   Social History   Tobacco Use  . Smoking status: Former Smoker    Packs/day: 1.00    Years: 30.00    Pack years: 30.00    Types: Cigarettes  . Smokeless tobacco: Never Used  . Tobacco comment: quit some time in 2012  Substance Use Topics  . Alcohol use: No    Alcohol/week: 0.0 standard drinks    Comment: rare consumption    FAMILY HISTORY:   Family History  Problem Relation Age of Onset  . COPD Mother   . Heart attack Mother   . Stroke Father   . COPD Father     DRUG ALLERGIES:   Allergies  Allergen Reactions  . Penicillins Hives and Other (See Comments)    Has patient had a PCN reaction causing immediate rash, facial/tongue/throat swelling, SOB or lightheadedness with hypotension: No Has patient had a PCN reaction causing severe rash involving mucus membranes or skin necrosis: No Has patient had a PCN reaction that required hospitalization No Has patient had a PCN reaction occurring within the last 10 years: No If all of the above answers are "  NO", then may proceed with Cephalosporin use. Other reaction(s): Other (See Comments) Has patient had a PCN reaction causing immediate rash, facial/tongue/throat swelling, SOB or lightheadedness with hypotension: No Has patient had a PCN reaction causing severe rash involving mucus membranes or skin necrosis: No Has patient had a PCN reaction that required hospitalization No Has patient had a PCN reaction occurring within the last 10 years: No If all of the above answers are "NO", then may proceed with Cephalosporin use.  . Codeine Hives, Nausea And  Vomiting and Nausea Only    REVIEW OF SYSTEMS:  CONSTITUTIONAL: No fever, chills or sweats.  Positive for weight gain.  Fatigue or weakness.  EYES: No blurred or double vision.  EARS, NOSE, AND THROAT: No tinnitus or ear pain. No sore throat RESPIRATORY: Positive for cough, shortness of breath, and wheezing.no hemoptysis.  CARDIOVASCULAR: No chest pain, orthopnea, edema.  GASTROINTESTINAL: No nausea, vomiting, diarrhea or abdominal pain. No blood in bowel movements.  Constipation relieved by Ex-Lax last night GENITOURINARY: No dysuria, hematuria.  ENDOCRINE: No polyuria, nocturia,  HEMATOLOGY: No anemia, easy bruising or bleeding SKIN: No rash or lesion. MUSCULOSKELETAL: Joint pain all over especially in the back NEUROLOGIC: No tingling, numbness, weakness.  PSYCHIATRY: No anxiety or depression.   MEDICATIONS AT HOME:   Prior to Admission medications   Medication Sig Start Date End Date Taking? Authorizing Provider  ALPRAZolam Prudy Feeler) 0.5 MG tablet Take 0.5 mg by mouth 3 (three) times daily.    Yes [provider]  diltiazem (CARDIZEM CD) 180 MG 24 hr capsule Take 1 capsule (180 mg total) by mouth daily. 11/26/17  Yes Enid Baas, MD  donepezil (ARICEPT) 10 MG tablet Take 10 mg by mouth daily.    Yes [provider]  furosemide (LASIX) 20 MG tablet Take 1 tablet (20 mg total) by mouth daily. 11/26/17  Yes Enid Baas, MD  ipratropium-albuterol (DUONEB) 0.5-2.5 (3) MG/3ML SOLN Inhale 3 mLs into the lungs every 4 (four) hours as needed (for wheezing/shortness of breath). Patient taking differently: Inhale 3 mLs into the lungs every 4 (four) hours as needed. For wheezing/shortness of breath 05/13/16  Yes Enid Baas, MD  metoprolol tartrate (LOPRESSOR) 25 MG tablet Take 1 tablet (25 mg total) by mouth 2 (two) times daily. 11/25/17  Yes Enid Baas, MD  pantoprazole (PROTONIX) 40 MG tablet TAKE ONE TABLET BY MOUTH TWICE DAILY Patient taking  differently: Take 40 mg by mouth 2 (two) times daily.  04/03/18  Yes Pasty Spillers, MD  predniSONE (DELTASONE) 10 MG tablet Take 4 tablets (40 mg total) by mouth daily with breakfast. Taper by 10 mg then stop 09/11/18  Yes Enedina Finner, MD  rosuvastatin (CRESTOR) 10 MG tablet Take 10 mg by mouth daily.  10/10/17  Yes [provider]  tamsulosin (FLOMAX) 0.4 MG CAPS capsule Take 1 capsule (0.4 mg total) by mouth daily. 11/26/17  Yes Enid Baas, MD      VITAL SIGNS:  Blood pressure 123/69, pulse 71, temperature 97.7 F (36.5 C), temperature source Axillary, resp. rate 14, height 4\' 11"  (1.499 m), weight 57 kg, SpO2 100 %.  PHYSICAL EXAMINATION:  GENERAL:  52 y.o.-year-old patient lying in the bed with  acute respiratory distress.  EYES: Pupils equal, round, reactive to light and accommodation. No scleral icterus. Extraocular muscles intact.  HEENT: Head atraumatic, normocephalic. Oropharynx and nasopharynx clear.  NECK:  Supple, no jugular venous distention. No thyroid enlargement, no tenderness.  LUNGS: Decreased breath sounds bilaterally, positive expiratory wheezing,  no rales,rhonchi or crepitation.  Positive use of accessory muscles of respiration.  CARDIOVASCULAR: S1, S2 normal. No murmurs, rubs, or gallops.  ABDOMEN: Soft, nontender, nondistended. Bowel sounds present. No organomegaly or mass.  EXTREMITIES: No pedal edema, cyanosis, or clubbing.  NEUROLOGIC: Cranial nerves II through XII are intact. Muscle strength 5/5 in all extremities. Sensation intact. Gait not checked.  Pain to palpation over thoracic spine. PSYCHIATRIC: The patient is alert and oriented x 3.  SKIN: No rash, lesion, or ulcer.   LABORATORY PANEL:   CBC Recent Labs  Lab 09/13/18 0855  WBC 14.0*  HGB 12.3  HCT 41.5  PLT 238   ------------------------------------------------------------------------------------------------------------------  Chemistries  Recent Labs  Lab 09/13/18 0855   NA 141  K 4.1  CL 104  CO2 28  GLUCOSE 86  BUN 19  CREATININE 0.67  CALCIUM 8.6*   ------------------------------------------------------------------------------------------------------------------  Cardiac Enzymes Recent Labs  Lab 09/13/18 0855  TROPONINI <0.03   ------------------------------------------------------------------------------------------------------------------  RADIOLOGY:  Dg Chest Port 1 View  Result Date: 09/13/2018 CLINICAL DATA:  Respiratory distress EXAM: PORTABLE CHEST 1 VIEW COMPARISON:  Chest radiograph 09/10/2018 FINDINGS: Monitoring leads overlie the patient. Stable cardiac and mediastinal contours. No consolidative pulmonary opacities. No pleural effusion or pneumothorax. IMPRESSION: No acute cardiopulmonary process. Electronically Signed   By: Annia Beltrew  Davis M.D.   On: 09/13/2018 09:40    EKG:   Looks like normal sinus rhythm rather than atrial fibrillation.  I think there is a lot of interference on the EKG. Nonspecific ST-T wave changes.  IMPRESSION AND PLAN:   1.  Acute hypoxic and hypercarbic respiratory failure.  Requiring BiPAP and ICU stepdown admission.  Critical care consultation. 2.  COPD exacerbation start DuoNeb budesonide and Solu-Medrol. 3.  Hypertension.  Continue Cardizem and lower dose metoprolol 4.  Chronic diastolic congestive heart failure.  Hold Lasix for right now 5.  Back pain obtain a x-ray of the thoracic spine.  PRN morphine. 6.  Anxiety on Xanax  All the records are reviewed and case discussed with ED provider. Management plans discussed with the patient, family and they are in agreement.  CODE STATUS: Full Code  TOTAL TIME TAKING CARE OF THIS PATIENT: 50 minutes, including acp time.    Alford Highlandichard Lessly Stigler M.D on 09/13/2018 at 1:07 PM  Between 7am to 6pm - Pager - [phone]  After 6pm call admission pager [phone]  [facility] [employer] Office  [phone]  CC: Primary care physician; Corky DownsMasoud, Javed, MD

## 2018-09-13 NOTE — Progress Notes (Signed)
RT assisted with patient transport from ER to ICU room 13 while patient on V60 BiPap. No complications noted. Total time for transport=20 minutes.

## 2018-09-13 NOTE — ED Provider Notes (Addendum)
[facility] Emergency Department Provider Note  ____________________________________________   I have reviewed the triage vital signs and the nursing notes. Where available I have reviewed prior notes and, if possible and indicated, outside hospital notes.    HISTORY  Chief Complaint Respiratory Distress    HPI Caitlyn Evans is a 52 y.o. female  With a history of anxiety, panic attacks, Boop, history of emphysema, on home oxygen, continued abuse of tobacco despite repeated counseling, discharged from the hospital on the 26 for a COPD exacerbation accompanied by his ID, patient states she has anxiety attacks "all the time every day",  she states her dog died and she is very anxious.  She states she became acutely short of breath today.  She states that this is consistent with her asthma.  She denies productive cough.  She denies fever or chest pain.  Nothing makes it better nothing makes it worse has tried albuterol at home with limited success.  He states "I need steroids, and Ativan" no SI no HI.    Past Medical History:  Diagnosis Date  . Anxiety    panic attacks  . Asthma   . BOOP (bronchiolitis obliterans with organizing pneumonia) (HCC)   . CHF (congestive heart failure) (HCC)   . Dysphagia, pharyngoesophageal phase 05/30/2015  . Emphysema lung (HCC) 05/30/2015  . Hypercholesterolemia   . Hypertension   . Lung mass   . Migraines   . Motion sickness    all moving vehicles  . Myocardial infarction (HCC) 2012  . Seasonal allergies   . Shortness of breath dyspnea    1 flight-stairs    Patient Active Problem List   Diagnosis Date Noted  . Acute on chronic respiratory failure with hypoxia (HCC) 09/09/2018  . Esophagitis, unspecified   . Stomach irritation   . Hiatal hernia   . COPD (chronic obstructive pulmonary disease) (HCC) 07/19/2016  . Acute on chronic respiratory failure (HCC) 05/09/2016  . Noncompliance with medication regimen 05/09/2016   . COPD with acute exacerbation (HCC) 02/05/2016  . Acute exacerbation of chronic obstructive pulmonary disease (COPD) (HCC)   . Acute respiratory failure (HCC)   . Acute respiratory failure with hypoxia (HCC) 12/05/2015  . BOOP (bronchiolitis obliterans with organizing pneumonia) (HCC) 12/05/2015  . Depression, major, single episode, moderate (HCC) 07/14/2015  . Adjustment disorder with anxiety 07/14/2015  . COPD exacerbation (HCC) 07/14/2015  . Special screening for malignant neoplasms, colon   . Swallowing difficulty   . Loss of weight   . Esophagogastric ulcer   . Feline esophagus   . Hypertension 05/30/2015  . Emphysema lung (HCC) 05/30/2015  . Dysphagia, pharyngoesophageal phase 05/30/2015    Past Surgical History:  Procedure Laterality Date  . COLONOSCOPY WITH PROPOFOL N/A 06/02/2015   Procedure: COLONOSCOPY WITH PROPOFOL;  Surgeon: Midge Minium, MD;  Location: [facility];  Service: Endoscopy;  Laterality: N/A;  . ESOPHAGOGASTRODUODENOSCOPY (EGD) WITH PROPOFOL N/A 06/02/2015   Procedure: ESOPHAGOGASTRODUODENOSCOPY (EGD) WITH PROPOFOL withdialation;  Surgeon: Midge Minium, MD;  Location: [facility];  Service: Endoscopy;  Laterality: N/A;  . ESOPHAGOGASTRODUODENOSCOPY (EGD) WITH PROPOFOL N/A 11/05/2017   Procedure: ESOPHAGOGASTRODUODENOSCOPY (EGD) WITH PROPOFOL;  Surgeon: Pasty Spillers, MD;  Location: [facility];  Service: Endoscopy;  Laterality: N/A;  . LUNG SURGERY Left    Upper lobe removed  . OOPHORECTOMY Left   . VAGINAL HYSTERECTOMY      Prior to Admission medications   Medication Sig Start Date End Date Taking? Authorizing Provider  ALPRAZolam (XANAX) 0.5 MG tablet Take 0.5 mg by mouth 3 (three) times daily.     [provider]  diltiazem (CARDIZEM CD) 180 MG 24 hr capsule Take 1 capsule (180 mg total) by mouth daily. 11/26/17   Enid Baas, MD  donepezil (ARICEPT) 10 MG tablet Take 10 mg by mouth daily.     [provider]  furosemide (LASIX) 20 MG tablet Take 1 tablet (20 mg total) by mouth daily. 11/26/17   Enid Baas, MD  ipratropium-albuterol (DUONEB) 0.5-2.5 (3) MG/3ML SOLN Inhale 3 mLs into the lungs every 4 (four) hours as needed (for wheezing/shortness of breath). Patient taking differently: Inhale 3 mLs into the lungs every 4 (four) hours as needed. For wheezing/shortness of breath 05/13/16   Enid Baas, MD  metoprolol tartrate (LOPRESSOR) 25 MG tablet Take 1 tablet (25 mg total) by mouth 2 (two) times daily. 11/25/17   Enid Baas, MD  pantoprazole (PROTONIX) 40 MG tablet TAKE ONE TABLET BY MOUTH TWICE DAILY Patient taking differently: Take 40 mg by mouth 2 (two) times daily.  04/03/18   Pasty Spillers, MD  predniSONE (DELTASONE) 10 MG tablet Take 4 tablets (40 mg total) by mouth daily with breakfast. Taper by 10 mg then stop 09/11/18   Enedina Finner, MD  rosuvastatin (CRESTOR) 10 MG tablet Take 10 mg by mouth daily.  10/10/17   [provider]  tamsulosin (FLOMAX) 0.4 MG CAPS capsule Take 1 capsule (0.4 mg total) by mouth daily. 11/26/17   Enid Baas, MD    Allergies Penicillins and Codeine  Family History  Problem Relation Age of Onset  . COPD Mother   . Heart attack Mother   . Stroke Father   . COPD Father     Social History Social History   Tobacco Use  . Smoking status: Former Smoker    Packs/day: 1.00    Years: 30.00    Pack years: 30.00    Types: Cigarettes  . Smokeless tobacco: Never Used  . Tobacco comment: quit some time in 2012  Substance Use Topics  . Alcohol use: No    Alcohol/week: 0.0 standard drinks    Comment: rare consumption  . Drug use: No    Review of Systems Constitutional: No fever/chills Eyes: No visual changes. ENT: No sore throat. No stiff neck no neck pain Cardiovascular: Denies chest pain. Respiratory: + shortness of breath. Gastrointestinal:   no vomiting.  No diarrhea.  No constipation. Genitourinary:  Negative for dysuria. Musculoskeletal: Negative lower extremity swelling Skin: Negative for rash. Neurological: Negative for severe headaches, focal weakness or numbness.   ____________________________________________   PHYSICAL EXAM:  VITAL SIGNS: ED Triage Vitals  Enc Vitals Group     BP 09/13/18 0849 (!) 114/92     Pulse Rate 09/13/18 0849 94     Resp 09/13/18 0849 (!) 21     Temp --      Temp src --      SpO2 09/13/18 0849 100 %     Weight --      Height --      Head Circumference --      Peak Flow --      Pain Score 09/13/18 0843 10     Pain Loc --      Pain Edu? --      Excl. in GC? --     Constitutional: Alert and oriented.  Very anxious and upset breathing quickly but speaking in full sentences, with pressured speech. Eyes:  Conjunctivae are normal Head: Atraumatic HEENT: No congestion/rhinnorhea. Mucous membranes are moist.  Oropharynx non-erythematous Neck:   Nontender with no meningismus, no masses, no stridor Cardiovascular: Normal rate, regular rhythm. Grossly normal heart sounds.  Good peripheral circulation. Respiratory: Patient speaking pretty much nonstop when I am in the room I do have to ask her to be quite and focus on her breathing because I think that will help her.  She does have diffuse wheezes, and increased work of breathing.  No rales or rhonchi, Abdominal: Soft and nontender. No distention. No guarding no rebound Back:  There is no focal tenderness or step off.  there is no midline tenderness there are no lesions noted. there is no CVA tenderness  Musculoskeletal: No lower extremity tenderness, no upper extremity tenderness. No joint effusions, no DVT signs strong distal pulses no edema Neurologic:  Normal speech and language. No gross focal neurologic deficits are appreciated.  Skin:  Skin is warm, dry and intact. No rash noted. Psychiatric: Mood and affect are very anxious. Speech and behavior are  normal.  ____________________________________________   LABS (all labs ordered are listed, but only abnormal results are displayed)  Labs Reviewed  INFLUENZA PANEL BY PCR (TYPE A & B)  BASIC METABOLIC PANEL  TROPONIN I  CBC WITH DIFFERENTIAL/PLATELET  BRAIN NATRIURETIC PEPTIDE    Pertinent labs  results that were available during my care of the patient were reviewed by me and considered in my medical decision making (see chart for details). ____________________________________________  EKG  I personally interpreted any EKGs ordered by me or triage  ____________________________________________  RADIOLOGY  Pertinent labs & imaging results that were available during my care of the patient were reviewed by me and considered in my medical decision making (see chart for details). If possible, patient and/or family made aware of any abnormal findings.  No results found. ____________________________________________    PROCEDURES  Procedure(s) performed: None  Procedures  Critical Care performed:  CRITICAL CARE Performed by: Jeanmarie PlantJAMES A    Total critical care time: 42 minutes  Critical care time was exclusive of separately billable procedures and treating other patients.  Critical care was necessary to treat or prevent imminent or life-threatening deterioration.  Critical care was time spent personally by me on the following activities: development of treatment plan with patient and/or surrogate as well as nursing, discussions with consultants, evaluation of patient's response to treatment, examination of patient, obtaining history from patient or surrogate, ordering and performing treatments and interventions, ordering and review of laboratory studies, ordering and review of radiographic studies, pulse oximetry and re-evaluation of patient's condition.   ____________________________________________   INITIAL IMPRESSION / ASSESSMENT AND PLAN / ED COURSE  Pertinent  labs & imaging results that were available during my care of the patient were reviewed by me and considered in my medical decision making (see chart for details).  Patient here with a panic attack so she describes it as well as her baseline asthma, unclear to what extent 1 or the other is contributing to her dyspnea.  She has sats of 100%, she is moving air, and she does have diffuse wheeze which is not atypical for her.  She does continue to smoke even after her last hospitalization.  We will give her Solu-Medrol, we will place her on BiPAP we will give her Ativan and we will reassess.  Very hard to tell how much of this is panic attack and how much of this is asthma.  Fortunately her sats are in  good condition and I hope that we can get her feeling better soon  ----------------------------------------- 10:24 AM on 09/13/2018 -----------------------------------------  Patient's work of breathing is decreased and her lungs are not completely clear but doing better, no evidence of active infection her white count is somewhat elevated but she was recently on steroids. Chest x-ray is clear, flu is negative, we will admit her for further observation and care   ____________________________________________   FINAL CLINICAL IMPRESSION(S) / ED DIAGNOSES  Final diagnoses:  SOB (shortness of breath)      This chart was dictated using voice recognition software.  Despite best efforts to proofread,  errors can occur which can change meaning.      Jeanmarie PlantMcShane,  A, MD 09/13/18 [id number]    Jeanmarie PlantMcShane,  A, MD 09/13/18 1024

## 2018-09-13 NOTE — Progress Notes (Signed)
Patient ID: Caitlyn Evans, female   DOB: [birth year], 52 y.o.   MRN: [id number]  ACP note  Patient present  Diagnosis: Acute hypoxic hypercarbic respiratory failure, COPD exacerbation, hypertension, chronic diastolic congestive heart failure, back pain and anxiety  CODE STATUS discussed and patient is a full code.  Plan.  Treat COPD exacerbation with BiPAP initially, IV steroids and nebulizer treatments.  Patient returning to the hospital after a short period of time is a poor prognosis.  Time spent on ACP discussion 17 minutes Dr. Alford Highlandichard Christon Parada

## 2018-09-13 NOTE — ED Triage Notes (Signed)
Pt to ED via POV for respiratory distress. Pt was admitted on Bi-pap on 12/25. Pt states that yesterday her symptoms started to get worse again. Pt has audible wheezing on upon arrival as well as increased work of breathing. Pt is in moderate distress at this time.

## 2018-09-13 NOTE — ED Notes (Signed)
Admitting Provider at bedside. 

## 2018-09-13 NOTE — Consult Note (Signed)
Reason for Consult: Assistance with management of acute respiratory distress. Referring Physician: Alford Highland, MD  Caitlyn Evans is an 52 y.o. female.  HPI: This is a 52 year old former smoker known to the PCCM service due to multiple admissions.  Most recently she was here on 25 December for an exacerbation of COPD.  She had a negative work-up for influenza but required BiPAP transiently.  She was subsequently discharged to home after a 24-hour observation.  For the details of that admission please refer to Glenford Peers NP, note of 25 December consultation performed with Dr. Billy Fischer.  The patient has a very complex medical history that includes end-stage COPD, prior multiple intubations, organizing pneumonia to name a few.  The patient currently because of severe back pain does not endorse many symptoms except for the back pain and shortness of breath.  She is currently on BiPAP.  She has not been febrile to her recollection but again she is not reliable with her history.  A T-spine series has been performed to evaluate her back pain and she has new compression fractures at T6 and T12.  The patient is requesting narcotics.  A trial of nonsteroidals will be offered first.  Past Medical History:  Diagnosis Date  . Anxiety    panic attacks  . Asthma   . BOOP (bronchiolitis obliterans with organizing pneumonia) (HCC)   . CHF (congestive heart failure) (HCC)   . Dysphagia, pharyngoesophageal phase 05/30/2015  . Emphysema lung (HCC) 05/30/2015  . Hypercholesterolemia   . Hypertension   . Lung mass   . Migraines   . Motion sickness    all moving vehicles  . Myocardial infarction (HCC) 2012  . Seasonal allergies   . Shortness of breath dyspnea    1 flight-stairs    Past Surgical History:  Procedure Laterality Date  . COLONOSCOPY WITH PROPOFOL N/A 06/02/2015   Procedure: COLONOSCOPY WITH PROPOFOL;  Surgeon: Midge Minium, MD;  Location: [facility];  Service: Endoscopy;   Laterality: N/A;  . ESOPHAGOGASTRODUODENOSCOPY (EGD) WITH PROPOFOL N/A 06/02/2015   Procedure: ESOPHAGOGASTRODUODENOSCOPY (EGD) WITH PROPOFOL withdialation;  Surgeon: Midge Minium, MD;  Location: [facility];  Service: Endoscopy;  Laterality: N/A;  . ESOPHAGOGASTRODUODENOSCOPY (EGD) WITH PROPOFOL N/A 11/05/2017   Procedure: ESOPHAGOGASTRODUODENOSCOPY (EGD) WITH PROPOFOL;  Surgeon: Pasty Spillers, MD;  Location: [facility];  Service: Endoscopy;  Laterality: N/A;  . LUNG SURGERY Left    Upper lobe removed  . OOPHORECTOMY Left   . VAGINAL HYSTERECTOMY      Family History  Problem Relation Age of Onset  . COPD Mother   . Heart attack Mother   . Stroke Father   . COPD Father     Social History:  reports that she has quit smoking. Her smoking use included cigarettes. She has a 30.00 pack-year smoking history. She has never used smokeless tobacco. She reports that she does not drink alcohol or use drugs.  Allergies:  Allergies  Allergen Reactions  . Penicillins Hives and Other (See Comments)    Has patient had a PCN reaction causing immediate rash, facial/tongue/throat swelling, SOB or lightheadedness with hypotension: No Has patient had a PCN reaction causing severe rash involving mucus membranes or skin necrosis: No Has patient had a PCN reaction that required hospitalization No Has patient had a PCN reaction occurring within the last 10 years: No If all of the above answers are "NO", then may proceed with Cephalosporin use. Other reaction(s): Other (See Comments) Has patient had a  PCN reaction causing immediate rash, facial/tongue/throat swelling, SOB or lightheadedness with hypotension: No Has patient had a PCN reaction causing severe rash involving mucus membranes or skin necrosis: No Has patient had a PCN reaction that required hospitalization No Has patient had a PCN reaction occurring within the last 10 years: No If all of the above answers are "NO", then may  proceed with Cephalosporin use.  . Codeine Hives, Nausea And Vomiting and Nausea Only    Medications: I have reviewed the patient's current medications.  Results for orders placed or performed during the hospital encounter of 09/13/18 (from the past 48 hour(s))  Influenza panel by PCR (type A & B)     Status: None   Collection Time: 09/13/18  8:50 AM  Result Value Ref Range   Influenza A By PCR NEGATIVE NEGATIVE   Influenza B By PCR NEGATIVE NEGATIVE    Comment: (NOTE) The Xpert Xpress Flu assay is intended as an aid in the diagnosis of  influenza and should not be used as a sole basis for treatment.  This  assay is FDA approved for nasopharyngeal swab specimens only. Nasal  washings and aspirates are unacceptable for Xpert Xpress Flu testing. Performed at [facility], [address] [place], [place] [address]   Basic metabolic panel     Status: Abnormal   Collection Time: 09/13/18  8:55 AM  Result Value Ref Range   Sodium 141 135 - 145 mmol/L   Potassium 4.1 3.5 - 5.1 mmol/L   Chloride 104 98 - 111 mmol/L   CO2 28 22 - 32 mmol/L   Glucose, Bld 86 70 - 99 mg/dL   BUN 19 6 - 20 mg/dL   Creatinine, Ser 6.040.67 0.44 - 1.00 mg/dL   Calcium 8.6 (L) 8.9 - 10.3 mg/dL   GFR calc non Af Amer >60 >60 mL/min   GFR calc Af Amer >60 >60 mL/min   Anion gap 9 5 - 15    Comment: Performed at [facility], [address] [place], [place] [address]  Troponin I - Once     Status: None   Collection Time: 09/13/18  8:55 AM  Result Value Ref Range   Troponin I <0.03 <0.03 ng/mL    Comment: Performed at [facility], [address] [place], [place] [address]  CBC with Differential     Status: Abnormal   Collection Time: 09/13/18  8:55 AM  Result Value Ref Range   WBC 14.0 (H) 4.0 - 10.5 K/uL   RBC 4.67 3.87 - 5.11 MIL/uL   Hemoglobin 12.3 12.0 - 15.0 g/dL   HCT 78.241.5 95.636.0 - 21.346.0 %   MCV 88.9 80.0 - 100.0 fL   MCH 26.3 26.0 - 34.0 pg   MCHC 29.6 (L) 30.0 - 36.0  g/dL   RDW 08.616.4 (H) 57.811.5 - 46.915.5 %   Platelets 238 150 - 400 K/uL   nRBC 0.0 0.0 - 0.2 %   Neutrophils Relative % 89 %   Neutro Abs 12.3 (H) 1.7 - 7.7 K/uL   Lymphocytes Relative 4 %   Lymphs Abs 0.6 (L) 0.7 - 4.0 K/uL   Monocytes Relative 6 %   Monocytes Absolute 0.9 0.1 - 1.0 K/uL   Eosinophils Relative 0 %   Eosinophils Absolute 0.0 0.0 - 0.5 K/uL   Basophils Relative 0 %   Basophils Absolute 0.0 0.0 - 0.1 K/uL   Immature Granulocytes 1 %   Abs Immature Granulocytes 0.18 (H) 0.00 - 0.07  K/uL    Comment: Performed at [facility], [address] [place], [place] [address]  Brain natriuretic peptide     Status: None   Collection Time: 09/13/18  8:55 AM  Result Value Ref Range   B Natriuretic Peptide 58.0 0.0 - 100.0 pg/mL    Comment: Performed at [facility], [address] [place], [place] [address]  Respiratory Panel by PCR     Status: None   Collection Time: 09/13/18 11:04 AM  Result Value Ref Range   Adenovirus NOT DETECTED NOT DETECTED   Coronavirus 229E NOT DETECTED NOT DETECTED   Coronavirus HKU1 NOT DETECTED NOT DETECTED   Coronavirus NL63 NOT DETECTED NOT DETECTED   Coronavirus OC43 NOT DETECTED NOT DETECTED   Metapneumovirus NOT DETECTED NOT DETECTED   Rhinovirus / Enterovirus NOT DETECTED NOT DETECTED   Influenza A NOT DETECTED NOT DETECTED   Influenza B NOT DETECTED NOT DETECTED   Parainfluenza Virus 1 NOT DETECTED NOT DETECTED   Parainfluenza Virus 2 NOT DETECTED NOT DETECTED   Parainfluenza Virus 3 NOT DETECTED NOT DETECTED   Parainfluenza Virus 4 NOT DETECTED NOT DETECTED   Respiratory Syncytial Virus NOT DETECTED NOT DETECTED   Bordetella pertussis NOT DETECTED NOT DETECTED   Chlamydophila pneumoniae NOT DETECTED NOT DETECTED   Mycoplasma pneumoniae NOT DETECTED NOT DETECTED  Blood gas, venous     Status: Abnormal   Collection Time: 09/13/18 12:10 PM  Result Value Ref Range   pH, Ven 7.37 7.250 - 7.430   pCO2, Ven 54 44.0 - 60.0  mmHg   Bicarbonate 31.2 (H) 20.0 - 28.0 mmol/L   Acid-Base Excess 4.5 (H) 0.0 - 2.0 mmol/L   O2 Saturation 25.0 %   Patient temperature 37.0    Collection site VEIN    Sample type VENIPUNCTURE     Comment: Performed at [facility], [address] [place], [place] [address]  Glucose, capillary     Status: Abnormal   Collection Time: 09/13/18 12:29 PM  Result Value Ref Range   Glucose-Capillary 153 (H) 70 - 99 mg/dL  Glucose, capillary     Status: Abnormal   Collection Time: 09/13/18  4:20 PM  Result Value Ref Range   Glucose-Capillary 132 (H) 70 - 99 mg/dL    Dg Thoracic Spine 2 View  Result Date: 09/13/2018 CLINICAL DATA:  Patient reports thoracic back pain without injury, onset today. Denies any previous injury to thoracic spine. EXAM: THORACIC SPINE 2 VIEWS COMPARISON:  09/13/2018 and chest CT on 04/25/2018 FINDINGS: There is 30% wedge compression fracture T6, new since August 2019 but indeterminate age. There is a superior endplate fracture of T12, with 10% loss of anterior height, also new since prior study but of indeterminate age. Posterior ribs are unremarkable. IMPRESSION: Compression fractures of T6 and T12, age indeterminate but new since August 2019. Electronically Signed   By: Norva Pavlov M.D.   On: 09/13/2018 17:33   Dg Chest Port 1 View  Result Date: 09/13/2018 CLINICAL DATA:  Respiratory distress EXAM: PORTABLE CHEST 1 VIEW COMPARISON:  Chest radiograph 09/10/2018 FINDINGS: Monitoring leads overlie the patient. Stable cardiac and mediastinal contours. No consolidative pulmonary opacities. No pleural effusion or pneumothorax. IMPRESSION: No acute cardiopulmonary process. Electronically Signed   By: Annia Belt M.D.   On: 09/13/2018 09:40    Review of Systems  Unable to perform ROS: Acuity of condition (Pt. on BiPAP )   Blood pressure 118/66, pulse 83, temperature 98 F (36.7 C), temperature source  Oral, resp. rate (!) 21, height 4\' 11"  (1.499 m), weight  57 kg, SpO2 99 %. Physical Exam General: chronically ill appearing female, NAD on Bipap moderate increased use of accessories of respiration Neuro:  Sleepy but easily awakened, follows commands  HEENT: supple, no JVD  Cardiovascular: nsr, rrr, no R/G Lungs: diminished throughout, no wheezes or rhonchi noted Abdomen:  Normoactive BS x4, soft, tender, non distended  Musculoskeletal: normal bulk and tone, no edema, vertebral tenderness along the thoracic spine.  Skin: scattered ecchymoses   Assessment/Plan:  Acute on chronic respiratory failure secondary to AECOPD  Hx: Asthma, BOOP, paraseptal emphysema PRN Bipap for dyspnea and/or hypoxia Scheduled and prn bronchodilator therapy  IV and nebulized steroids  Influenza PCR results negative, respiratory panel PCR negative. Suspect that this exacerbation is related to her recent compression fractures with increased pain and splinting with breathing.   Compression fractures of T6 and T12 new Pain control with goal to limit narcotics as much as possible.  Trial of Toradol. Consider IR consultation for possible vertebroplasty.  This may help with pain control and limit use of narcotics.  Prophylaxis VTE px: subq lovenox GI: Protonix     Gailen Shelter. Laura Gonzalez, MD [place] [facility]   09/13/2018, 10:25 PM

## 2018-09-13 NOTE — Discharge Summary (Signed)
[facility] [place] [facility]   PATIENT NAME: Caitlyn Evans    MR#:  [id number]  DATE OF BIRTH:  30-Oct-1965  DATE OF ADMISSION:  08/31/2018 ADMITTING PHYSICIAN: Ihor Austin, MD  DATE OF DISCHARGE: 09/02/2018 12:02 PM  PRIMARY CARE PHYSICIAN: Corky Downs, MD   ADMISSION DIAGNOSIS:  COPD exacerbation (HCC) [J44.1] Chronic respiratory failure with hypoxia (HCC) [J96.11] HCAP (healthcare-associated pneumonia) [J18.9] Failure of outpatient treatment [Z78.9]  DISCHARGE DIAGNOSIS:  Active Problems:   COPD exacerbation (HCC)   SECONDARY DIAGNOSIS:   Past Medical History:  Diagnosis Date  . Anxiety    panic attacks  . Asthma   . BOOP (bronchiolitis obliterans with organizing pneumonia) (HCC)   . CHF (congestive heart failure) (HCC)   . Dysphagia, pharyngoesophageal phase 05/30/2015  . Emphysema lung (HCC) 05/30/2015  . Hypercholesterolemia   . Hypertension   . Lung mass   . Migraines   . Motion sickness    all moving vehicles  . Myocardial infarction (HCC) 2012  . Seasonal allergies   . Shortness of breath dyspnea    1 flight-stairs     ADMITTING HISTORY  HISTORY OF PRESENT ILLNESS: Caitlyn Evans  is a 52 y.o. female with a known history of congestive heart failure, Boop, COPD, hyperlipidemia, hypertension presented to the emergency room for shortness of breath and wheezing for the last few days.  Patient uses oxygen at 3 L via nasal cannula at home.  She was intubated in the past for respiratory failure.  When she presented to the emergency room she was hypoxic tachypneic and wheezing.  She was put on BiPAP and stabilized.  Patient was given nebulization treatments.  She was given broad-spectrum IV vancomycin and cefepime antibiotic.  Patient tried antibiotics Levaquin and Zithromax as outpatient.  No complaints of any chest pain.  Has wheezing and shortness of breath.   HOSPITAL COURSE:   *Acute on chronic hypoxic and hypercapnic respiratory  failure *Chronic diastolic congestive heart failure *Boop *Anxiety  Patient was admitted to stepdown unit on BiPAP.  She was able to come off BiPAP quickly onto nasal cannula.  Continued to have wheezing.  Seen by pulmonary Dr. Jeralene Huff.  Transferred to medical floor and monitored overnight.  Continued on IV steroids, nebulizers, inhalers.  By the day of discharge patient tells me that she feels close to baseline.  Still has significant wheezing and congestion in the chest but according to the patient this is her baseline.  Requesting to be discharged home.  Patient discharged home with a prednisone taper and close follow-up with her pulmonologist as outpatient.  High risk for readmission.  CONSULTS OBTAINED:  Treatment Team:  Pccm, [facility], MD  DRUG ALLERGIES:   Allergies  Allergen Reactions  . Penicillins Hives and Other (See Comments)    Has patient had a PCN reaction causing immediate rash, facial/tongue/throat swelling, SOB or lightheadedness with hypotension: No Has patient had a PCN reaction causing severe rash involving mucus membranes or skin necrosis: No Has patient had a PCN reaction that required hospitalization No Has patient had a PCN reaction occurring within the last 10 years: No If all of the above answers are "NO", then may proceed with Cephalosporin use. Other reaction(s): Other (See Comments) Has patient had a PCN reaction causing immediate rash, facial/tongue/throat swelling, SOB or lightheadedness with hypotension: No Has patient had a PCN reaction causing severe rash involving mucus membranes or skin necrosis: No Has patient had a PCN reaction that required hospitalization No Has patient  had a PCN reaction occurring within the last 10 years: No If all of the above answers are "NO", then may proceed with Cephalosporin use.  . Codeine Hives, Nausea And Vomiting and Nausea Only    DISCHARGE MEDICATIONS:   Allergies as of 09/02/2018      Reactions   Penicillins  Hives, Other (See Comments)   Has patient had a PCN reaction causing immediate rash, facial/tongue/throat swelling, SOB or lightheadedness with hypotension: No Has patient had a PCN reaction causing severe rash involving mucus membranes or skin necrosis: No Has patient had a PCN reaction that required hospitalization No Has patient had a PCN reaction occurring within the last 10 years: No If all of the above answers are "NO", then may proceed with Cephalosporin use. Other reaction(s): Other (See Comments) Has patient had a PCN reaction causing immediate rash, facial/tongue/throat swelling, SOB or lightheadedness with hypotension: No Has patient had a PCN reaction causing severe rash involving mucus membranes or skin necrosis: No Has patient had a PCN reaction that required hospitalization No Has patient had a PCN reaction occurring within the last 10 years: No If all of the above answers are "NO", then may proceed with Cephalosporin use.   Codeine Hives, Nausea And Vomiting, Nausea Only      Medication List    TAKE these medications   ALPRAZolam 0.5 MG tablet Commonly known as:  XANAX Take 0.5 mg by mouth 3 (three) times daily.   diltiazem 180 MG 24 hr capsule Commonly known as:  CARDIZEM CD Take 1 capsule (180 mg total) by mouth daily.   donepezil 10 MG tablet Commonly known as:  ARICEPT Take 10 mg by mouth daily.   furosemide 20 MG tablet Commonly known as:  LASIX Take 1 tablet (20 mg total) by mouth daily.   ipratropium-albuterol 0.5-2.5 (3) MG/3ML Soln Commonly known as:  DUONEB Inhale 3 mLs into the lungs every 4 (four) hours as needed (for wheezing/shortness of breath). What changed:    reasons to take this  additional instructions   metoprolol tartrate 25 MG tablet Commonly known as:  LOPRESSOR Take 1 tablet (25 mg total) by mouth 2 (two) times daily.   pantoprazole 40 MG tablet Commonly known as:  PROTONIX TAKE ONE TABLET BY MOUTH TWICE DAILY   rosuvastatin  10 MG tablet Commonly known as:  CRESTOR Take 10 mg by mouth daily.   tamsulosin 0.4 MG Caps capsule Commonly known as:  FLOMAX Take 1 capsule (0.4 mg total) by mouth daily.     ASK your doctor about these medications   predniSONE 50 MG tablet Commonly known as:  DELTASONE Take 1 tablet (50 mg total) by mouth daily for 5 days. Return to previous schedule once you finish this dose. Ask about: Should I take this medication?       Today   VITAL SIGNS:  Blood pressure 93/60, pulse 63, temperature (!) 97.4 F (36.3 C), temperature source Axillary, resp. rate 18, height 4\' 10"  (1.473 m), weight 55.3 kg, SpO2 90 %.  I/O:  No intake or output data in the 24 hours ending 09/13/18 1308  PHYSICAL EXAMINATION:  Physical Exam  GENERAL:  52 y.o.-year-old patient lying in the bed with no acute distress.  LUNGS: Bilateral wheezing CARDIOVASCULAR: S1, S2 normal. No murmurs, rubs, or gallops.  ABDOMEN: Soft, non-tender, non-distended. Bowel sounds present. No organomegaly or mass.  NEUROLOGIC: Moves all 4 extremities. PSYCHIATRIC: The patient is alert and oriented x 3.  SKIN: No obvious rash, lesion,  or ulcer.   DATA REVIEW:   CBC Recent Labs  Lab 09/13/18 0855  WBC 14.0*  HGB 12.3  HCT 41.5  PLT 238    Chemistries  Recent Labs  Lab 09/13/18 0855  NA 141  K 4.1  CL 104  CO2 28  GLUCOSE 86  BUN 19  CREATININE 0.67  CALCIUM 8.6*    Cardiac Enzymes Recent Labs  Lab 09/13/18 0855  TROPONINI <0.03    Microbiology Results  Results for orders placed or performed during the hospital encounter of 08/31/18  MRSA PCR Screening     Status: None   Collection Time: 08/31/18  3:13 PM  Result Value Ref Range Status   MRSA by PCR NEGATIVE NEGATIVE Final    Comment:        The GeneXpert MRSA Assay (FDA approved for NASAL specimens only), is one component of a comprehensive MRSA colonization surveillance program. It is not intended to diagnose MRSA infection nor to  guide or monitor treatment for MRSA infections. Performed at [facility], [address] [place], [place] [address]     RADIOLOGY:  Dg Chest Port 1 View  Result Date: 09/13/2018 CLINICAL DATA:  Respiratory distress EXAM: PORTABLE CHEST 1 VIEW COMPARISON:  Chest radiograph 09/10/2018 FINDINGS: Monitoring leads overlie the patient. Stable cardiac and mediastinal contours. No consolidative pulmonary opacities. No pleural effusion or pneumothorax. IMPRESSION: No acute cardiopulmonary process. Electronically Signed   By: Annia Beltrew  Davis M.D.   On: 09/13/2018 09:40    Follow up with PCP in 1 week.  Management plans discussed with the patient, family and they are in agreement.  CODE STATUS:  Code Status History    Date Active Date Inactive Code Status Order ID Comments User Context   09/09/2018 2113 09/10/2018 1939 Full Code [id number]  Campbell StallMayo, Katy Dodd, MD ED   08/31/2018 1507 09/02/2018 1509 Full Code [id number]  Ihor AustinPyreddy, Pavan, MD ED   11/18/2017 2000 11/25/2017 1802 Full Code [id number]  Ihor AustinPyreddy, Pavan, MD ED   07/01/2017 2023 07/04/2017 1648 Full Code [id number]  Altamese DillingVachhani, Vaibhavkumar, MD ED   07/19/2016 1148 07/20/2016 2010 Full Code [id number]  Adrian SaranMody, Sital, MD Inpatient   05/09/2016 1440 05/13/2016 1437 Full Code [id number]  Merwyn KatosSimonds, David B, MD ED   02/05/2016 1914 02/08/2016 1452 Full Code [id number]  Ramonita LabGouru, Aruna, MD Inpatient   12/05/2015 2302 12/09/2015 1535 Full Code [id number]  Oralia ManisWillis, David, MD Inpatient    Advance Directive Documentation     Most Recent Value  Type of Advance Directive  Healthcare Power of Attorney, Living will  Pre-existing out of facility DNR order (yellow form or pink MOST form)  -  "MOST" Form in Place?  -      TOTAL TIME TAKING CARE OF THIS PATIENT ON DAY OF DISCHARGE: more than 30 minutes.   Molinda BailiffSrikar R Kyjuan Gause M.D on 09/13/2018 at 1:08 PM  Between 7am to 6pm - Pager - [phone]  After 6pm go to [email or web address] - password [email or web address] [facility]  [facility] [place]  [facility]  Office  [phone]  CC: Primary care physician; Corky DownsMasoud, Javed, MD  Note: This dictation was prepared with Dragon dictation along with smaller phrase technology. Any transcriptional errors that result from this process are unintentional.

## 2018-09-14 ENCOUNTER — Inpatient Hospital Stay: Payer: Medicare Other

## 2018-09-14 LAB — CBC
HCT: 34.6 % — ABNORMAL LOW (ref 36.0–46.0)
Hemoglobin: 10.3 g/dL — ABNORMAL LOW (ref 12.0–15.0)
MCH: 25.9 pg — ABNORMAL LOW (ref 26.0–34.0)
MCHC: 29.8 g/dL — ABNORMAL LOW (ref 30.0–36.0)
MCV: 87.2 fL (ref 80.0–100.0)
Platelets: 192 10*3/uL (ref 150–400)
RBC: 3.97 MIL/uL (ref 3.87–5.11)
RDW: 16.2 % — ABNORMAL HIGH (ref 11.5–15.5)
WBC: 12.9 10*3/uL — ABNORMAL HIGH (ref 4.0–10.5)
nRBC: 0 % (ref 0.0–0.2)

## 2018-09-14 LAB — BASIC METABOLIC PANEL
Anion gap: 5 (ref 5–15)
BUN: 24 mg/dL — AB (ref 6–20)
CO2: 28 mmol/L (ref 22–32)
Calcium: 8.2 mg/dL — ABNORMAL LOW (ref 8.9–10.3)
Chloride: 104 mmol/L (ref 98–111)
Creatinine, Ser: 0.6 mg/dL (ref 0.44–1.00)
GFR calc Af Amer: >60 mL/min (ref >60)
GFR calc non Af Amer: >60 mL/min (ref >60)
Glucose, Bld: 115 mg/dL — ABNORMAL HIGH (ref 70–99)
POTASSIUM: 4.1 mmol/L (ref 3.5–5.1)
Sodium: 137 mmol/L (ref 135–145)

## 2018-09-14 MED ORDER — OXYCODONE-ACETAMINOPHEN 5-325 MG PO TABS
1.0000 | ORAL_TABLET | Freq: Four times a day (QID) | ORAL | Status: DC | PRN
  Filled 2018-09-14: qty 2

## 2018-09-14 MED ORDER — OXYCODONE-ACETAMINOPHEN 5-325 MG PO TABS
1.0000 | ORAL_TABLET | Freq: Four times a day (QID) | ORAL | Status: DC | PRN
  Administered 2018-09-14 – 2018-09-15 (×4): 2 via ORAL
  Filled 2018-09-14 (×3): qty 2

## 2018-09-14 MED ORDER — POLYETHYLENE GLYCOL 3350 17 G PO PACK: 17.0000 g | PACK | Freq: Every day | ORAL | Status: DC | PRN

## 2018-09-14 MED ORDER — METHYLPREDNISOLONE SODIUM SUCC 40 MG IJ SOLR
40.0000 mg | Freq: Two times a day (BID) | INTRAMUSCULAR | Status: DC
  Administered 2018-09-14 – 2018-09-15 (×2): 40 mg via INTRAVENOUS
  Filled 2018-09-14 (×2): qty 1

## 2018-09-14 MED ORDER — SENNOSIDES-DOCUSATE SODIUM 8.6-50 MG PO TABS
1.0000 | ORAL_TABLET | Freq: Two times a day (BID) | ORAL | Status: DC
  Administered 2018-09-14 – 2018-09-15 (×3): 1 via ORAL
  Filled 2018-09-14 (×3): qty 1

## 2018-09-14 MED ORDER — FENTANYL 12 MCG/HR TD PT72
12.5000 ug | MEDICATED_PATCH | TRANSDERMAL | Status: DC
  Administered 2018-09-14: 12.5 ug via TRANSDERMAL
  Filled 2018-09-14: qty 1

## 2018-09-14 NOTE — Progress Notes (Signed)
Pharmacy Constipation Monitoring Consult:  Pharmacy consulted to assist in monitoring and managing constipation medications in this 52 y.o. female admitted on 09/13/2018 with COPD exacerbation. Patient with compression fractures of the T6 and T12 requiring NSAIDS and narcotics. Patient initiated on low dose fentanyl patch, 12.1015mcg, this am and Percocet 1-2 tabs Q6hr prn for moderate and severe pain.   Assessment/Plan: Patient has not had a documented bowel movement this admission. Will initiate senna/docusate 1 tab BID and Miralax 17g Daily PRN for no bowel movement x 24 hours.   Pharmacy will continue to monitor and adjust per consult.   Simpson,Michael L 09/14/2018 11:42 AM

## 2018-09-14 NOTE — Consult Note (Signed)
Patient quite tender in low T spine, >25 years oral steroid use. MRI ordered

## 2018-09-14 NOTE — Progress Notes (Signed)
[facility] - [place] [facility]   PATIENT NAME: Caitlyn Evans    MR#:  [id number]  DATE OF BIRTH:  Oct 31, 1965  SUBJECTIVE:  CHIEF COMPLAINT:   Chief Complaint  Patient presents with  . Respiratory Distress  Patient seen and evaluated today Weaned of BiPAP Comfortable on oxygen via nasal cannula Has lower back pain Has prolonged use of steroids  REVIEW OF SYSTEMS:    ROS  CONSTITUTIONAL: No documented fever. No fatigue, weakness. No weight gain, no weight loss.  EYES: No blurry or double vision.  ENT: No tinnitus. No postnasal drip. No redness of the oropharynx.  RESPIRATORY: Has cough, decreased wheeze, no hemoptysis. Has dyspnea.  CARDIOVASCULAR: No chest pain. No orthopnea. No palpitations. No syncope.  GASTROINTESTINAL: No nausea, no vomiting or diarrhea. No abdominal pain. No melena or hematochezia.  GENITOURINARY: No dysuria or hematuria.  ENDOCRINE: No polyuria or nocturia. No heat or cold intolerance.  HEMATOLOGY: No anemia. No bruising. No bleeding.  INTEGUMENTARY: No rashes. No lesions.  MUSCULOSKELETAL: No arthritis. No swelling. No gout.  Has lower back pain NEUROLOGIC: No numbness, tingling, or ataxia. No seizure-type activity.  PSYCHIATRIC: No anxiety. No insomnia. No ADD.   DRUG ALLERGIES:   Allergies  Allergen Reactions  . Penicillins Hives and Other (See Comments)    Has patient had a PCN reaction causing immediate rash, facial/tongue/throat swelling, SOB or lightheadedness with hypotension: No Has patient had a PCN reaction causing severe rash involving mucus membranes or skin necrosis: No Has patient had a PCN reaction that required hospitalization No Has patient had a PCN reaction occurring within the last 10 years: No If all of the above answers are "NO", then may proceed with Cephalosporin use. Other reaction(s): Other (See Comments) Has patient had a PCN reaction causing immediate rash, facial/tongue/throat swelling, SOB or  lightheadedness with hypotension: No Has patient had a PCN reaction causing severe rash involving mucus membranes or skin necrosis: No Has patient had a PCN reaction that required hospitalization No Has patient had a PCN reaction occurring within the last 10 years: No If all of the above answers are "NO", then may proceed with Cephalosporin use.  . Codeine Hives, Nausea And Vomiting and Nausea Only    VITALS:  Blood pressure (!) 151/65, pulse (!) 58, temperature 98.5 F (36.9 C), temperature source Oral, resp. rate 16, height 4\' 11"  (1.499 m), weight 57 kg, SpO2 99 %.  PHYSICAL EXAMINATION:   Physical Exam  GENERAL:  52 y.o.-year-old patient lying in the bed with no acute distress.  EYES: Pupils equal, round, reactive to light and accommodation. No scleral icterus. Extraocular muscles intact.  HEENT: Head atraumatic, normocephalic. Oropharynx and nasopharynx clear.  NECK:  Supple, no jugular venous distention. No thyroid enlargement, no tenderness.  LUNGS: Improved breath sounds bilaterally, bilateral wheezing noted. No use of accessory muscles of respiration.  CARDIOVASCULAR: S1, S2 normal. No murmurs, rubs, or gallops.  ABDOMEN: Soft, nontender, nondistended. Bowel sounds present. No organomegaly or mass.  EXTREMITIES: No cyanosis, clubbing or edema b/l.    Thoracolumbar area  Tenderness noted in the thoracic spine NEUROLOGIC: Cranial nerves II through XII are intact. No focal Motor or sensory deficits b/l.   PSYCHIATRIC: The patient is alert and oriented x 3.  SKIN: No obvious rash, lesion, or ulcer.   LABORATORY PANEL:   CBC Recent Labs  Lab 09/14/18 0404  WBC 12.9*  HGB 10.3*  HCT 34.6*  PLT 192   ------------------------------------------------------------------------------------------------------------------ Chemistries  Recent Labs  Lab 09/14/18 0404  NA 137  K 4.1  CL 104  CO2 28  GLUCOSE 115*  BUN 24*  CREATININE 0.60  CALCIUM 8.2*    ------------------------------------------------------------------------------------------------------------------  Cardiac Enzymes Recent Labs  Lab 09/13/18 0855  TROPONINI <0.03   ------------------------------------------------------------------------------------------------------------------  RADIOLOGY:  Dg Thoracic Spine 2 View  Result Date: 09/13/2018 CLINICAL DATA:  Patient reports thoracic back pain without injury, onset today. Denies any previous injury to thoracic spine. EXAM: THORACIC SPINE 2 VIEWS COMPARISON:  09/13/2018 and chest CT on 04/25/2018 FINDINGS: There is 30% wedge compression fracture T6, new since August 2019 but indeterminate age. There is a superior endplate fracture of T12, with 10% loss of anterior height, also new since prior study but of indeterminate age. Posterior ribs are unremarkable. IMPRESSION: Compression fractures of T6 and T12, age indeterminate but new since August 2019. Electronically Signed   By: Norva PavlovElizabeth  Brown M.D.   On: 09/13/2018 17:33   Dg Chest Port 1 View  Result Date: 09/13/2018 CLINICAL DATA:  Respiratory distress EXAM: PORTABLE CHEST 1 VIEW COMPARISON:  Chest radiograph 09/10/2018 FINDINGS: Monitoring leads overlie the patient. Stable cardiac and mediastinal contours. No consolidative pulmonary opacities. No pleural effusion or pneumothorax. IMPRESSION: No acute cardiopulmonary process. Electronically Signed   By: Annia Beltrew  Davis M.D.   On: 09/13/2018 09:40     ASSESSMENT AND PLAN:  52 year old female patient with history of COPD chronic congestive heart failure, hyperlipidemia, hypertension under hospitalist service for respiratory distress  -Status post hypoxic hypercapnic respiratory failure Weaned of BiPAP Oxygen via nasal cannula nebulization treatments along with Solu-Medrol  -Acute COPD exacerbation Aggressive nebulization therapy along with IV steroids  -Intractable back pain from T6 and T12 compression fractures of the  vertebra Orthopedic surgery consultation with Dr. Rosita KeaMenz for vertebral kyphoplasty MRI thoraco lumbar spine will be checked  -Chronic congestive heart failure Stable and continue medical management   All the records are reviewed and case discussed with Care Management/Social Worker. Management plans discussed with the patient, family and they are in agreement.  CODE STATUS: Full code  DVT Prophylaxis: SCDs  TOTAL TIME TAKING CARE OF THIS PATIENT: 37 minutes.   POSSIBLE D/C IN 2 to 3 DAYS, DEPENDING ON CLINICAL CONDITION.  Ihor AustinPavan Selin Eisler M.D on 09/14/2018 at 1:03 PM  Between 7am to 6pm - Pager - [phone]  After 6pm go to [email or web address] - password [email or web address] [facility]  [facility] [place] [facility]  Office  [phone]  CC: Primary care physician; Corky DownsMasoud, Javed, MD  Note: This dictation was prepared with Dragon dictation along with smaller phrase technology. Any transcriptional errors that result from this process are unintentional.

## 2018-09-14 NOTE — Progress Notes (Signed)
Follow up - Critical Care Medicine Note  Patient Details:    Caitlyn Evans Forstrom is an 52 y.o. female.  With a past medical history remarkable for severe COPD, on home oxygen at 3 L, prior history of multiple intubations, prior tracheostomy, left upper lobe surgery presented with severe back pain, T6/T12 compression fractures with COPD exacerbation requiring BiPAP  Anti-infectives:  Anti-infectives (From admission, onward)   None      Microbiology: Results for orders placed or performed during the hospital encounter of 09/13/18  Respiratory Panel by PCR     Status: None   Collection Time: 09/13/18 11:04 AM  Result Value Ref Range Status   Adenovirus NOT DETECTED NOT DETECTED Final   Coronavirus 229E NOT DETECTED NOT DETECTED Final   Coronavirus HKU1 NOT DETECTED NOT DETECTED Final   Coronavirus NL63 NOT DETECTED NOT DETECTED Final   Coronavirus OC43 NOT DETECTED NOT DETECTED Final   Metapneumovirus NOT DETECTED NOT DETECTED Final   Rhinovirus / Enterovirus NOT DETECTED NOT DETECTED Final   Influenza A NOT DETECTED NOT DETECTED Final   Influenza B NOT DETECTED NOT DETECTED Final   Parainfluenza Virus 1 NOT DETECTED NOT DETECTED Final   Parainfluenza Virus 2 NOT DETECTED NOT DETECTED Final   Parainfluenza Virus 3 NOT DETECTED NOT DETECTED Final   Parainfluenza Virus 4 NOT DETECTED NOT DETECTED Final   Respiratory Syncytial Virus NOT DETECTED NOT DETECTED Final   Bordetella pertussis NOT DETECTED NOT DETECTED Final   Chlamydophila pneumoniae NOT DETECTED NOT DETECTED Final   Mycoplasma pneumoniae NOT DETECTED NOT DETECTED Final     Studies: Dg Thoracic Spine 2 View  Result Date: 09/13/2018 CLINICAL DATA:  Patient reports thoracic back pain without injury, onset today. Denies any previous injury to thoracic spine. EXAM: THORACIC SPINE 2 VIEWS COMPARISON:  09/13/2018 and chest CT on 04/25/2018 FINDINGS: There is 30% wedge compression fracture T6, new since August 2019 but  indeterminate age. There is a superior endplate fracture of T12, with 10% loss of anterior height, also new since prior study but of indeterminate age. Posterior ribs are unremarkable. IMPRESSION: Compression fractures of T6 and T12, age indeterminate but new since August 2019. Electronically Signed   By: Norva PavlovElizabeth  Brown Evans.D.   On: 09/13/2018 17:33   Dg Chest Port 1 View  Result Date: 09/13/2018 CLINICAL DATA:  Respiratory distress EXAM: PORTABLE CHEST 1 VIEW COMPARISON:  Chest radiograph 09/10/2018 FINDINGS: Monitoring leads overlie the patient. Stable cardiac and mediastinal contours. No consolidative pulmonary opacities. No pleural effusion or pneumothorax. IMPRESSION: No acute cardiopulmonary process. Electronically Signed   By: Annia Beltrew  Davis Evans.D.   On: 09/13/2018 09:40   Dg Chest Port 1 View  Result Date: 09/10/2018 CLINICAL DATA:  Acute onset of shortness of breath. EXAM: PORTABLE CHEST 1 VIEW COMPARISON:  Chest radiograph 09/09/2018 FINDINGS: The lungs are well-aerated and clear. There is no evidence of focal opacification, pleural effusion or pneumothorax. The lung apices are partially obscured by the patient's head. The cardiomediastinal silhouette is within normal limits. No acute osseous abnormalities are seen. IMPRESSION: No acute cardiopulmonary process seen. Electronically Signed   By: Roanna RaiderJeffery  Chang Evans.D.   On: 09/10/2018 05:01   Dg Chest Portable 1 View  Result Date: 09/09/2018 CLINICAL DATA:  Stage IV COPD with dyspnea EXAM: PORTABLE CHEST 1 VIEW COMPARISON:  08/31/2018 FINDINGS: Mild pulmonary hyperinflation. Normal heart size and mediastinal contours with minimal aortic atherosclerosis at the arch. Vascular calcifications project over the right lung apex as well. No thoracic aortic  aneurysm. No effusion nor pneumothorax. No acute osseous abnormality. IMPRESSION: Mild pulmonary hyperinflation without active pulmonary disease. Electronically Signed   By: Tollie Ethavid  Kwon Evans.D.   On:  09/09/2018 17:14   Dg Chest Portable 1 View  Result Date: 08/31/2018 CLINICAL DATA:  Respiratory distress. EXAM: PORTABLE CHEST 1 VIEW COMPARISON:  Radiographs of April 25, 2018. FINDINGS: The heart size and mediastinal contours are within normal limits. No pneumothorax or pleural effusion is noted. Stable right apical scarring is noted. No acute pulmonary disease is noted. The visualized skeletal structures are unremarkable. IMPRESSION: No acute cardiopulmonary abnormality seen. Electronically Signed   By: Lupita RaiderJames  Green Jr, Evans.D.   On: 08/31/2018 12:12    Consults: Treatment Team:  Salena SanerGonzalez, Carmen L, MD   Subjective:    Overnight Issues: Patient's breathing has improved however complaining of severe back pain  Objective:  Vital signs for last 24 hours: Temp:  [97.7 F (36.5 C)-98.6 F (37 C)] 98.6 F (37 C) (12/30 0807) Pulse Rate:  [49-83] 61 (12/30 0807) Resp:  [13-25] 18 (12/30 0807) BP: (97-135)/(57-86) 134/80 (12/30 0807) SpO2:  [98 %-100 %] 99 % (12/30 0807) FiO2 (%):  [30 %] 30 % (12/29 1225) Weight:  [57 kg] 57 kg (12/29 1225)  Hemodynamic parameters for last 24 hours:    Intake/Output from previous day: 12/29 0701 - 12/30 0700 In: 320 [P.O.:320] Out: 700 [Urine:700]  Intake/Output this shift: No intake/output data recorded.  Vent settings for last 24 hours: FiO2 (%):  [30 %] 30 %  Physical Exam:  General:chronically ill appearing female, NAD, weaned off of BiPAP  neuro: Sleepy but easily awakened, follows commands HEENT:supple, no JVD Cardiovascular:nsr, rrr, no R/G Lungs:diminished throughout, no wheezes or rhonchi noted Abdomen: Normoactive BS x4, soft, tender, non distended Musculoskeletal:normal bulk and tone, no edema, vertebral tenderness along the thoracic spine.  Assessment/Plan:  Acute on chronic respiratory failure secondary to COPD.  Patient will continue on Pulmicort, albuterol, Atrovent, Solu-Medrol, nocturnal BiPAP as needed.   Chest x-ray does not reveal any evidence of pneumonia  Compression fractures, T6 and T12 with severe pain requiring nonsteroidals and narcotics intermittently.  Would recommend consultation with orthopedics to see if anything can be done for pain control to include vertebroplasty  At this point patient is stable for floor transfer from the intensive care unit  Fox Salminen 09/14/2018  *Care during the described time interval was provided by me and/or other providers on the critical care team.  I have reviewed this patient's available data, including medical history, events of note, physical examination and test results as part of my evaluation.

## 2018-09-15 ENCOUNTER — Encounter: Payer: Self-pay | Admitting: *Deleted

## 2018-09-15 MED ORDER — PREDNISONE 10 MG PO TABS: 10.0000 mg | ORAL_TABLET | Freq: Every day | ORAL | 0 refills | Status: DC

## 2018-09-15 MED ORDER — OXYCODONE-ACETAMINOPHEN 5-325 MG PO TABS: 1.0000 | ORAL_TABLET | Freq: Four times a day (QID) | ORAL | 0 refills | Status: DC | PRN

## 2018-09-15 NOTE — Progress Notes (Signed)
09/15/2018 11:34 AM Artemisia Myrtha MantisM Bhola to be D/C'd Home per MD order.  Discussed prescriptions and follow up appointments with the patient. Prescriptions given to patient, medication list explained in detail. Pt verbalized understanding.  Allergies as of 09/15/2018      Reactions   Penicillins Hives, Other (See Comments)   Has patient had a PCN reaction causing immediate rash, facial/tongue/throat swelling, SOB or lightheadedness with hypotension: No Has patient had a PCN reaction causing severe rash involving mucus membranes or skin necrosis: No Has patient had a PCN reaction that required hospitalization No Has patient had a PCN reaction occurring within the last 10 years: No If all of the above answers are "NO", then may proceed with Cephalosporin use. Other reaction(s): Other (See Comments) Has patient had a PCN reaction causing immediate rash, facial/tongue/throat swelling, SOB or lightheadedness with hypotension: No Has patient had a PCN reaction causing severe rash involving mucus membranes or skin necrosis: No Has patient had a PCN reaction that required hospitalization No Has patient had a PCN reaction occurring within the last 10 years: No If all of the above answers are "NO", then may proceed with Cephalosporin use.   Codeine Hives, Nausea And Vomiting, Nausea Only      Medication List    TAKE these medications   ALPRAZolam 0.5 MG tablet Commonly known as:  XANAX Take 0.5 mg by mouth 3 (three) times daily.   diltiazem 180 MG 24 hr capsule Commonly known as:  CARDIZEM CD Take 1 capsule (180 mg total) by mouth daily.   donepezil 10 MG tablet Commonly known as:  ARICEPT Take 10 mg by mouth daily.   furosemide 20 MG tablet Commonly known as:  LASIX Take 1 tablet (20 mg total) by mouth daily.   ipratropium-albuterol 0.5-2.5 (3) MG/3ML Soln Commonly known as:  DUONEB Inhale 3 mLs into the lungs every 4 (four) hours as needed (for wheezing/shortness of breath). What  changed:    reasons to take this  additional instructions   metoprolol tartrate 25 MG tablet Commonly known as:  LOPRESSOR Take 1 tablet (25 mg total) by mouth 2 (two) times daily.   oxyCODONE-acetaminophen 5-325 MG tablet Commonly known as:  PERCOCET/ROXICET Take 1 tablet by mouth every 6 (six) hours as needed for moderate pain or severe pain.   pantoprazole 40 MG tablet Commonly known as:  PROTONIX TAKE ONE TABLET BY MOUTH TWICE DAILY   predniSONE 10 MG tablet Commonly known as:  DELTASONE Take 1 tablet (10 mg total) by mouth daily. Label  & dispense according to the schedule below.  6 tablets day one, then 5 table day 2, then 4 tablets day 3, then 3 tablets day 4, 2 tablets day 5, then 1 tablet day 6, then stop What changed:    how much to take  when to take this  additional instructions   rosuvastatin 10 MG tablet Commonly known as:  CRESTOR Take 10 mg by mouth daily.   tamsulosin 0.4 MG Caps capsule Commonly known as:  FLOMAX Take 1 capsule (0.4 mg total) by mouth daily.       Vitals:   09/15/18 0514 09/15/18 0853  BP: 109/68 123/74  Pulse: 66 68  Resp: 20 14  Temp:  98.6 F (37 C)  SpO2: 100% 94%    Skin clean, dry and intact without evidence of skin break down, no evidence of skin tears noted. IV catheter discontinued intact. Site without signs and symptoms of complications. Dressing and pressure applied. Pt  denies pain at this time. No complaints noted.  An After Visit Summary was printed and given to the patient. Patient escorted via WC, and D/C home via private auto.  Bradly Chrisougherty, Kem Parcher E

## 2018-09-15 NOTE — Pre-Procedure Instructions (Signed)
MESSAGED DR Karlton LemonKARENZ RE PATIENT HISTORY. [facility] WILL BE 2 HOUR EARLY ARRIVAL 09/17/18 DOS.

## 2018-09-15 NOTE — Pre-Procedure Instructions (Signed)
DR Karlton LemonKARENZ REVIEWED INFO AND AGREES WITH 2 HOUR EARLY ARRIVAL TIME DOS. ANESTHESIA WILL ASSESS AM SURGERY AND [facility]WARE.

## 2018-09-15 NOTE — Discharge Summary (Addendum)
[employer] -  at [facility]   PATIENT NAME: Caitlyn Evans    MR#:  [id number]  DATE OF BIRTH:  11/16/1965  DATE OF ADMISSION:  09/13/2018 ADMITTING PHYSICIAN: Alford Highlandichard Wieting, MD  DATE OF DISCHARGE: 09/15/2018 12:28 PM  PRIMARY CARE PHYSICIAN: Corky DownsMasoud, Javed, MD   ADMISSION DIAGNOSIS:  Back pain [M54.9] SOB (shortness of breath) [R06.02] Acute on chronic respiratory failure with hypoxia Acute COPD exacerbation DISCHARGE DIAGNOSIS:  Acute on chronic respiratory failure with hypoxia Acute COPD exacerbation Intractable back pain T6 T12 compression fractures of the vertebra Chronic congestive heart failure  SECONDARY DIAGNOSIS:   Past Medical History:  Diagnosis Date  . Anxiety    panic attacks  . Asthma   . BOOP (bronchiolitis obliterans with organizing pneumonia) (HCC)   . CHF (congestive heart failure) (HCC)   . Dysphagia, pharyngoesophageal phase 05/30/2015  . Emphysema lung (HCC) 05/30/2015   on steroids and nebulizer for copd issues.  09/15/18  . GERD (gastroesophageal reflux disease)   . Hypercholesterolemia   . Hypertension   . Lung mass   . Migraines   . Motion sickness    all moving vehicles  . Myocardial infarction (HCC) 2012  . Respiratory failure with hypercapnia (HCC) 08/2018   was hospitalized and on bipap. steroids and nebulizer treatments helped  . Seasonal allergies   . Shortness of breath dyspnea    1 flight-stairs  . Sternal fracture      ADMITTING HISTORY Caitlyn Evans  is a 52 y.o. female with a known history of COPD and CHF presents with respiratory distress.  She states she was recently in the hospital and sent home for COPD exacerbation.  She states that her dog recently died and she is very upset.  She states that she cannot breathe for the last 2 days.  She has been having severe back spasms and back pain.  She was asking for pain medication and muscle relaxant.  The patient was placed on BiPAP in the emergency room  and hospitalist services were contacted for further evaluation.  HOSPITAL COURSE:  Was admitted to stepdown unit..  Patient was put on BiPAP and stabilized for hypoxia.  Patient received IV Solu-Medrol and aggressive nebulization treatments.  She was evaluated by intensivist.  Her wheezing improved shortness of breath improved she was weaned off BiPAP.  For her back pain she was worked up with x-ray of the spine along with MRI spine.  MRI spine showed a new T6 vertebral compression fracture with old healed T12 compression fracture.  Orthopedic surgery consultation was done with Dr. Harriett SineNancy who recommended outpatient vertebral kyphoplasty.  He has been scheduled for Thursday of this week.  Patient has been moved to medical floor after being weaned of BiPAP.  He is comfortable on oxygen via nasal cannula.  Tobacco cessation has been counseled.  Patient will be discharged home on tapering dose of steroids and will continue home dose inhalers and nebulization treatments.  CONSULTS OBTAINED:  Treatment Team:  Kennedy BuckerMenz, Michael, MD  DRUG ALLERGIES:   Allergies  Allergen Reactions  . Codeine Hives, Nausea And Vomiting and Nausea Only  . Penicillins Hives and Other (See Comments)    Has patient had a PCN reaction causing immediate rash, facial/tongue/throat swelling, SOB or lightheadedness with hypotension: No Has patient had a PCN reaction causing severe rash involving mucus membranes or skin necrosis: No Has patient had a PCN reaction that required hospitalization No Has patient had a PCN reaction occurring within the last 10  years: No If all of the above answers are "NO", then may proceed with Cephalosporin use. Other reaction(s): Other (See Comments) Has patient had a PCN    DISCHARGE MEDICATIONS:   Allergies as of 09/15/2018      Reactions   Codeine Hives, Nausea And Vomiting, Nausea Only   Penicillins Hives, Other (See Comments)   Has patient had a PCN reaction causing immediate rash,  facial/tongue/throat swelling, SOB or lightheadedness with hypotension: No Has patient had a PCN reaction causing severe rash involving mucus membranes or skin necrosis: No Has patient had a PCN reaction that required hospitalization No Has patient had a PCN reaction occurring within the last 10 years: No If all of the above answers are "NO", then may proceed with Cephalosporin use. Other reaction(s): Other (See Comments) Has patient had a PCN      Medication List    TAKE these medications   ALPRAZolam 0.5 MG tablet Commonly known as:  XANAX Take 0.5 mg by mouth 3 (three) times daily.   diltiazem 180 MG 24 hr capsule Commonly known as:  CARDIZEM CD Take 1 capsule (180 mg total) by mouth daily.   donepezil 10 MG tablet Commonly known as:  ARICEPT Take 10 mg by mouth daily.   furosemide 20 MG tablet Commonly known as:  LASIX Take 1 tablet (20 mg total) by mouth daily.   ipratropium-albuterol 0.5-2.5 (3) MG/3ML Soln Commonly known as:  DUONEB Inhale 3 mLs into the lungs every 4 (four) hours as needed (for wheezing/shortness of breath). What changed:    reasons to take this  additional instructions   metoprolol tartrate 25 MG tablet Commonly known as:  LOPRESSOR Take 1 tablet (25 mg total) by mouth 2 (two) times daily.   oxyCODONE-acetaminophen 5-325 MG tablet Commonly known as:  PERCOCET/ROXICET Take 1 tablet by mouth every 6 (six) hours as needed for moderate pain or severe pain.   pantoprazole 40 MG tablet Commonly known as:  PROTONIX TAKE ONE TABLET BY MOUTH TWICE DAILY   predniSONE 10 MG tablet Commonly known as:  DELTASONE Take 1 tablet (10 mg total) by mouth daily. Label  & dispense according to the schedule below.  6 tablets day one, then 5 table day 2, then 4 tablets day 3, then 3 tablets day 4, 2 tablets day 5, then 1 tablet day 6, then stop What changed:    how much to take  when to take this  additional instructions   rosuvastatin 10 MG  tablet Commonly known as:  CRESTOR Take 10 mg by mouth daily.   tamsulosin 0.4 MG Caps capsule Commonly known as:  FLOMAX Take 1 capsule (0.4 mg total) by mouth daily.       Today  Patient seen and evaluated today Back pain better tolerated Decreased shortness of breath No wheezing No fever No chest pain VITAL SIGNS:  Blood pressure 123/74, pulse 68, temperature 98.6 F (37 C), temperature source Oral, resp. rate 14, height 4\' 11"  (1.499 m), weight 57 kg, SpO2 94 %.  I/O:    Intake/Output Summary (Last 24 hours) at 09/15/2018 1500 Last data filed at 09/15/2018 1049 Gross per 24 hour  Intake 480 ml  Output 0 ml  Net 480 ml    PHYSICAL EXAMINATION:  Physical Exam  GENERAL:  52 y.o.-year-old patient lying in the bed with no acute distress.  LUNGS: Normal breath sounds bilaterally, no wheezing, rales,rhonchi or crepitation. No use of accessory muscles of respiration.  CARDIOVASCULAR: S1, S2 normal. No  murmurs, rubs, or gallops.  ABDOMEN: Soft, non-tender, non-distended. Bowel sounds present. No organomegaly or mass.  NEUROLOGIC: Moves all 4 extremities. PSYCHIATRIC: The patient is alert and oriented x 3.  SKIN: No obvious rash, lesion, or ulcer.   DATA REVIEW:   CBC Recent Labs  Lab 09/14/18 0404  WBC 12.9*  HGB 10.3*  HCT 34.6*  PLT 192    Chemistries  Recent Labs  Lab 09/14/18 0404  NA 137  K 4.1  CL 104  CO2 28  GLUCOSE 115*  BUN 24*  CREATININE 0.60  CALCIUM 8.2*    Cardiac Enzymes Recent Labs  Lab 09/13/18 0855  TROPONINI <0.03    Microbiology Results  Results for orders placed or performed during the hospital encounter of 09/13/18  Respiratory Panel by PCR     Status: None   Collection Time: 09/13/18 11:04 AM  Result Value Ref Range Status   Adenovirus NOT DETECTED NOT DETECTED Final   Coronavirus 229E NOT DETECTED NOT DETECTED Final   Coronavirus HKU1 NOT DETECTED NOT DETECTED Final   Coronavirus NL63 NOT DETECTED NOT DETECTED  Final   Coronavirus OC43 NOT DETECTED NOT DETECTED Final   Metapneumovirus NOT DETECTED NOT DETECTED Final   Rhinovirus / Enterovirus NOT DETECTED NOT DETECTED Final   Influenza A NOT DETECTED NOT DETECTED Final   Influenza B NOT DETECTED NOT DETECTED Final   Parainfluenza Virus 1 NOT DETECTED NOT DETECTED Final   Parainfluenza Virus 2 NOT DETECTED NOT DETECTED Final   Parainfluenza Virus 3 NOT DETECTED NOT DETECTED Final   Parainfluenza Virus 4 NOT DETECTED NOT DETECTED Final   Respiratory Syncytial Virus NOT DETECTED NOT DETECTED Final   Bordetella pertussis NOT DETECTED NOT DETECTED Final   Chlamydophila pneumoniae NOT DETECTED NOT DETECTED Final   Mycoplasma pneumoniae NOT DETECTED NOT DETECTED Final    RADIOLOGY:  Dg Thoracic Spine 2 View  Result Date: 09/13/2018 CLINICAL DATA:  Patient reports thoracic back pain without injury, onset today. Denies any previous injury to thoracic spine. EXAM: THORACIC SPINE 2 VIEWS COMPARISON:  09/13/2018 and chest CT on 04/25/2018 FINDINGS: There is 30% wedge compression fracture T6, new since August 2019 but indeterminate age. There is a superior endplate fracture of T12, with 10% loss of anterior height, also new since prior study but of indeterminate age. Posterior ribs are unremarkable. IMPRESSION: Compression fractures of T6 and T12, age indeterminate but new since August 2019. Electronically Signed   By: Norva Pavlov M.D.   On: 09/13/2018 17:33   Mr Thoracic Spine Wo Contrast  Result Date: 09/14/2018 CLINICAL DATA:  Severe back pain. Compression fracture of the thoracic spine. EXAM: MRI THORACIC SPINE WITHOUT CONTRAST TECHNIQUE: Multiplanar, multisequence MR imaging of the thoracic spine was performed. No intravenous contrast was administered. COMPARISON:  Radiographs dated 09/13/2018 and CT scan of the chest dated 04/25/2018 FINDINGS: Alignment:  There is slight accentuation of the thoracic kyphosis. Vertebrae: There is a new  benign-appearing compression fracture of T6. Slight protrusion of the posterior margin of T6 into the spinal canal. There is a mild old slight compression fracture and Schmorl's node in the superior endplate of T12, but new since the CT scan of 04/25/2018. Cord:  Normal signal and morphology. Paraspinal and other soft tissues: Negative. Disc levels: C7-T1 through T4-5: Negative. T5-6: Benign-appearing compression fracture of the superior endplate of T6 extending into the base of both pedicles. Slight protrusion of the posterior margin of T6 into the spinal canal without neural impingement. No foraminal stenosis. T7-8  through T10-11: Negative. T11-12: Healed mild compression fracture and Schmorl's node in the superior endplate of T12 with slight protrusion of the posterosuperior aspect of T12 into the spinal canal without neural impingement. No spinal or foraminal stenosis. T12-L1 and L1-2: Negative. IMPRESSION: 1. New benign-appearing compression fracture of T6 without neural impingement. 2. Old healed compression fracture of the superior endplate of T12 without neural impingement. Electronically Signed   By: Francene BoyersJames  Maxwell M.D.   On: 09/14/2018 17:03    Follow up with PCP in 1 week.  Management plans discussed with the patient, family and they are in agreement.  CODE STATUS: Full code    Code Status Orders  (From admission, onward)         Start     Ordered   09/13/18 1105  Full code  Continuous     09/13/18 1104        Code Status History    Date Active Date Inactive Code Status Order ID Comments User Context   09/09/2018 2113 09/10/2018 1939 Full Code [id number]  Campbell StallMayo, Katy Dodd, MD ED   08/31/2018 1507 09/02/2018 1509 Full Code [id number]  Ihor AustinPyreddy, Rip Hawes, MD ED   11/18/2017 2000 11/25/2017 1802 Full Code [id number]  Ihor AustinPyreddy, Raistlin Gum, MD ED   07/01/2017 2023 07/04/2017 1648 Full Code [id number]  Altamese DillingVachhani, Vaibhavkumar, MD ED   07/19/2016 1148 07/20/2016 2010 Full Code [id number]  Adrian SaranMody, Sital, MD  Inpatient   05/09/2016 1440 05/13/2016 1437 Full Code [id number]  Merwyn KatosSimonds, David B, MD ED   02/05/2016 1914 02/08/2016 1452 Full Code [id number]  Ramonita LabGouru, Aruna, MD Inpatient   12/05/2015 2302 12/09/2015 1535 Full Code [id number]  Oralia ManisWillis, David, MD Inpatient    Advance Directive Documentation     Most Recent Value  Type of Advance Directive  Healthcare Power of Attorney  Pre-existing out of facility DNR order (yellow form or pink MOST form)  -  "MOST" Form in Place?  -      TOTAL TIME TAKING CARE OF THIS PATIENT ON DAY OF DISCHARGE: more than 35 minutes.   Ihor AustinPavan Maximo Spratling M.D on 09/15/2018 at 3:00 PM  Between 7am to 6pm - Pager - [phone]  After 6pm go to [email or web address] - password [email or web address] [facility]  [facility]  Office  [phone]  CC: Primary care physician; Corky DownsMasoud, Javed, MD  Note: This dictation was prepared with Dragon dictation along with smaller phrase technology. Any transcriptional errors that result from this process are unintentional.

## 2018-09-16 NOTE — H&P (Signed)
She has been having severe back spasms and back pain.  She was asking for pain medication and muscle relaxant.  The patient was recently admitted with worsening breathing problems and was found to have a T6 compression fracture at that time.  She is brought back today as an outpatient for T6 kyphoplasty because of unrelenting pain.  Her fracture was nontraumatic assumed to be coming from repeated coughing.  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 Anxiety    panic attacks  . Asthma   . BOOP (bronchiolitis obliterans with organizing pneumonia) (HCC)   . CHF (congestive heart failure) (HCC)   . Dysphagia, pharyngoesophageal phase 05/30/2015  . Emphysema lung (HCC) 05/30/2015  . Hypercholesterolemia   . Hypertension   . Lung mass   . Migraines   . Motion sickness    all moving vehicles  . Myocardial infarction (HCC) 2012  . Seasonal allergies   . Shortness of breath dyspnea    1 flight-stairs    PAST SURGICAL HISTORY:        Past Surgical History:  Procedure Laterality Date  . COLONOSCOPY WITH PROPOFOL N/A 06/02/2015   Procedure: COLONOSCOPY WITH PROPOFOL;  Surgeon: Midge Minium, MD;  Location: [facility];  Service: Endoscopy;  Laterality: N/A;  . ESOPHAGOGASTRODUODENOSCOPY (EGD) WITH PROPOFOL N/A 06/02/2015   Procedure: ESOPHAGOGASTRODUODENOSCOPY (EGD) WITH PROPOFOL withdialation;  Surgeon: Midge Minium, MD;  Location: [facility];  Service: Endoscopy;  Laterality: N/A;  . ESOPHAGOGASTRODUODENOSCOPY (EGD) WITH PROPOFOL N/A 11/05/2017   Procedure: ESOPHAGOGASTRODUODENOSCOPY (EGD) WITH PROPOFOL;  Surgeon: Pasty Spillers, MD;  Location: [facility];  Service: Endoscopy;  Laterality: N/A;  . LUNG SURGERY Left    Upper lobe removed  . OOPHORECTOMY Left   . VAGINAL HYSTERECTOMY      SOCIAL HISTORY:   Social History        Tobacco Use  .  Smoking status: Former Smoker    Packs/day: 1.00    Years: 30.00    Pack years: 30.00    Types: Cigarettes  . Smokeless tobacco: Never Used  . Tobacco comment: quit some time in 2012  Substance Use Topics  . Alcohol use: No    Alcohol/week: 0.0 standard drinks    Comment: rare consumption    FAMILY HISTORY:        Family History  Problem Relation Age of Onset  . COPD Mother   . Heart attack Mother   . Stroke Father   . COPD Father     DRUG ALLERGIES:        Allergies  Allergen Reactions  . Penicillins Hives and Other (See Comments)    Has patient had a PCN reaction causing immediate rash, facial/tongue/throat swelling, SOB or lightheadedness with hypotension: No Has patient had a PCN reaction causing severe rash involving mucus membranes or skin necrosis: No Has patient had a PCN reaction that required hospitalization No Has patient had a PCN reaction occurring within the last 10 years: No If all of the above answers are "NO", then may proceed with Cephalosporin use. Other reaction(s): Other (See Comments) Has patient had a PCN reaction causing immediate rash, facial/tongue/throat swelling, SOB or lightheadedness with hypotension: No Has patient had a PCN reaction causing severe rash involving mucus membranes or skin necrosis: No Has patient had a PCN reaction that required hospitalization No Has patient had a PCN reaction occurring within the last 10 years:  No If all of the above answers are "NO", then may proceed with Cephalosporin use.  . Codeine Hives, Nausea And Vomiting and Nausea Only    REVIEW OF SYSTEMS:  CONSTITUTIONAL: No fever, chills or sweats.  Positive for weight gain.  Fatigue or weakness.  EYES: No blurred or double vision.  EARS, NOSE, AND THROAT: No tinnitus or ear pain. No sore throat RESPIRATORY: Positive for cough, shortness of breath, and wheezing.no hemoptysis.  CARDIOVASCULAR: No chest pain, orthopnea, edema.   GASTROINTESTINAL: No nausea, vomiting, diarrhea or abdominal pain. No blood in bowel movements.  Constipation relieved by Ex-Lax last night GENITOURINARY: No dysuria, hematuria.  ENDOCRINE: No polyuria, nocturia,  HEMATOLOGY: No anemia, easy bruising or bleeding SKIN: No rash or lesion. MUSCULOSKELETAL: Joint pain all over especially in the back NEUROLOGIC: No tingling, numbness, weakness.  PSYCHIATRY: No anxiety or depression.   MEDICATIONS AT HOME:          Prior to Admission medications   Medication Sig Start Date End Date Taking? Authorizing Provider  ALPRAZolam Prudy Feeler) 0.5 MG tablet Take 0.5 mg by mouth 3 (three) times daily.    Yes [provider]  diltiazem (CARDIZEM CD) 180 MG 24 hr capsule Take 1 capsule (180 mg total) by mouth daily. 11/26/17  Yes Enid Baas, MD  donepezil (ARICEPT) 10 MG tablet Take 10 mg by mouth daily.    Yes [provider]  furosemide (LASIX) 20 MG tablet Take 1 tablet (20 mg total) by mouth daily. 11/26/17  Yes Enid Baas, MD  ipratropium-albuterol (DUONEB) 0.5-2.5 (3) MG/3ML SOLN Inhale 3 mLs into the lungs every 4 (four) hours as needed (for wheezing/shortness of breath). Patient taking differently: Inhale 3 mLs into the lungs every 4 (four) hours as needed. For wheezing/shortness of breath 05/13/16  Yes Enid Baas, MD  metoprolol tartrate (LOPRESSOR) 25 MG tablet Take 1 tablet (25 mg total) by mouth 2 (two) times daily. 11/25/17  Yes Enid Baas, MD  pantoprazole (PROTONIX) 40 MG tablet TAKE ONE TABLET BY MOUTH TWICE DAILY Patient taking differently: Take 40 mg by mouth 2 (two) times daily.  04/03/18  Yes Pasty Spillers, MD  predniSONE (DELTASONE) 10 MG tablet Take 4 tablets (40 mg total) by mouth daily with breakfast. Taper by 10 mg then stop 09/11/18  Yes Enedina Finner, MD  rosuvastatin (CRESTOR) 10 MG tablet Take 10 mg by mouth daily.  10/10/17  Yes [provider]  tamsulosin  (FLOMAX) 0.4 MG CAPS capsule Take 1 capsule (0.4 mg total) by mouth daily. 11/26/17  Yes Enid Baas, MD      VITAL SIGNS:  Blood pressure 123/69, pulse 71, temperature 97.7 F (36.5 C), temperature source Axillary, resp. rate 14, height 4\' 11"  (1.499 m), weight 57 kg, SpO2 100 %.  PHYSICAL EXAMINATION:  GENERAL:  53 y.o.-year-old patient lying in the bed with  acute respiratory distress.  EYES: Pupils equal, round, reactive to light and accommodation. No scleral icterus. Extraocular muscles intact.  HEENT: Head atraumatic, normocephalic. Oropharynx and nasopharynx clear.  NECK:  Supple, no jugular venous distention. No thyroid enlargement, no tenderness.  LUNGS: Decreased breath sounds bilaterally, positive expiratory wheezing, no rales,rhonchi or crepitation.  Positive use of accessory muscles of respiration.  CARDIOVASCULAR: S1, S2 normal. No murmurs, rubs, or gallops.  ABDOMEN: Soft, nontender, nondistended. Bowel sounds present. No organomegaly or mass.  EXTREMITIES: No pedal edema, cyanosis, or clubbing.  NEUROLOGIC: Cranial nerves II through XII are intact. Muscle strength 5/5 in all extremities. Sensation intact. Gait  not checked.  Pain to palpation over thoracic spine. PSYCHIATRIC: The patient is alert and oriented x 3.  SKIN: No rash, lesion, or ulcer.   LABORATORY PANEL:   CBC LastLabs     Recent Labs  Lab 09/13/18 0855  WBC 14.0*  HGB 12.3  HCT 41.5  PLT 238     ------------------------------------------------------------------------------------------------------------------  Chemistries  LastLabs     Recent Labs  Lab 09/13/18 0855  NA 141  K 4.1  CL 104  CO2 28  GLUCOSE 86  BUN 19  CREATININE 0.67  CALCIUM 8.6*     ------------------------------------------------------------------------------------------------------------------  Cardiac Enzymes LastLabs     Recent Labs  Lab 09/13/18 0855  TROPONINI <0.03      ------------------------------------------------------------------------------------------------------------------  RADIOLOGY:   ImagingResults(Last48hours)  Dg Chest Port 1 View  Result Date: 09/13/2018 CLINICAL DATA:  Respiratory distress EXAM: PORTABLE CHEST 1 VIEW COMPARISON:  Chest radiograph 09/10/2018 FINDINGS: Monitoring leads overlie the patient. Stable cardiac and mediastinal contours. No consolidative pulmonary opacities. No pleural effusion or pneumothorax. IMPRESSION: No acute cardiopulmonary process. Electronically Signed   By: Annia Beltrew  Davis M.D.   On: 09/13/2018 09:40    MRI shows 50% compression with edema and T6  IMPRESSION AND PLAN:   Acute T 6 compression fracture, long history of sterroid use  Plan is for T6 kyphoplasty as outpatient

## 2018-09-17 ENCOUNTER — Ambulatory Visit: Payer: Medicare Other | Admitting: Anesthesiology

## 2018-09-17 ENCOUNTER — Ambulatory Visit
Admission: RE | Admit: 2018-09-17 | Discharge: 2018-09-17 | Disposition: A | Discharge status: Home / Self Care | Payer: Medicare Other | Attending: Orthopedic Surgery | Admitting: Orthopedic Surgery

## 2018-09-17 ENCOUNTER — Ambulatory Visit: Payer: Medicare Other

## 2018-09-17 ENCOUNTER — Encounter
Admission: RE | Disposition: A | Discharge status: Home / Self Care | Payer: Self-pay | Source: Home / Self Care | Attending: Orthopedic Surgery

## 2018-09-17 DIAGNOSIS — T380X5A Adverse effect of glucocorticoids and synthetic analogues, initial encounter: Secondary | ICD-10-CM | POA: Insufficient documentation

## 2018-09-17 DIAGNOSIS — Z880 Allergy status to penicillin: Secondary | ICD-10-CM | POA: Insufficient documentation

## 2018-09-17 DIAGNOSIS — Z981 Arthrodesis status: Secondary | ICD-10-CM | POA: Diagnosis not present

## 2018-09-17 DIAGNOSIS — J449 Chronic obstructive pulmonary disease, unspecified: Secondary | ICD-10-CM | POA: Diagnosis not present

## 2018-09-17 DIAGNOSIS — I110 Hypertensive heart disease with heart failure: Secondary | ICD-10-CM | POA: Diagnosis not present

## 2018-09-17 DIAGNOSIS — S22058D Other fracture of T5-T6 vertebra, subsequent encounter for fracture with routine healing: Secondary | ICD-10-CM | POA: Diagnosis not present

## 2018-09-17 DIAGNOSIS — Z419 Encounter for procedure for purposes other than remedying health state, unspecified: Secondary | ICD-10-CM

## 2018-09-17 DIAGNOSIS — Z87891 Personal history of nicotine dependence: Secondary | ICD-10-CM | POA: Insufficient documentation

## 2018-09-17 DIAGNOSIS — E7800 Pure hypercholesterolemia, unspecified: Secondary | ICD-10-CM | POA: Diagnosis not present

## 2018-09-17 DIAGNOSIS — Z79899 Other long term (current) drug therapy: Secondary | ICD-10-CM | POA: Diagnosis not present

## 2018-09-17 DIAGNOSIS — M65331 Trigger finger, right middle finger: Secondary | ICD-10-CM | POA: Diagnosis not present

## 2018-09-17 DIAGNOSIS — Z7952 Long term (current) use of systemic steroids: Secondary | ICD-10-CM | POA: Insufficient documentation

## 2018-09-17 DIAGNOSIS — G5601 Carpal tunnel syndrome, right upper limb: Secondary | ICD-10-CM | POA: Diagnosis not present

## 2018-09-17 DIAGNOSIS — E119 Type 2 diabetes mellitus without complications: Secondary | ICD-10-CM | POA: Diagnosis not present

## 2018-09-17 DIAGNOSIS — Z885 Allergy status to narcotic agent status: Secondary | ICD-10-CM | POA: Insufficient documentation

## 2018-09-17 DIAGNOSIS — I509 Heart failure, unspecified: Secondary | ICD-10-CM | POA: Insufficient documentation

## 2018-09-17 DIAGNOSIS — M4854XA Collapsed vertebra, not elsewhere classified, thoracic region, initial encounter for fracture: Secondary | ICD-10-CM | POA: Diagnosis not present

## 2018-09-17 DIAGNOSIS — M818 Other osteoporosis without current pathological fracture: Secondary | ICD-10-CM | POA: Insufficient documentation

## 2018-09-17 DIAGNOSIS — I252 Old myocardial infarction: Secondary | ICD-10-CM | POA: Insufficient documentation

## 2018-09-17 DIAGNOSIS — F419 Anxiety disorder, unspecified: Secondary | ICD-10-CM | POA: Insufficient documentation

## 2018-09-17 DIAGNOSIS — K219 Gastro-esophageal reflux disease without esophagitis: Secondary | ICD-10-CM | POA: Diagnosis not present

## 2018-09-17 DIAGNOSIS — S22050A Wedge compression fracture of T5-T6 vertebra, initial encounter for closed fracture: Secondary | ICD-10-CM | POA: Diagnosis not present

## 2018-09-17 HISTORY — DX: Unspecified fracture of sternum, initial encounter for closed fracture: S22.20XA

## 2018-09-17 HISTORY — DX: Respiratory failure, unspecified with hypercapnia: J96.92

## 2018-09-17 HISTORY — DX: Gastro-esophageal reflux disease without esophagitis: K21.9

## 2018-09-17 HISTORY — PX: KYPHOPLASTY: SHX5884

## 2018-09-17 SURGERY — KYPHOPLASTY
Anesthesia: Monitor Anesthesia Care

## 2018-09-17 MED ORDER — FENTANYL CITRATE (PF) 100 MCG/2ML IJ SOLN
INTRAMUSCULAR | Status: DC | PRN
  Administered 2018-09-17 (×2): 50 ug via INTRAVENOUS

## 2018-09-17 MED ORDER — ONDANSETRON HCL 4 MG PO TABS: 4.0000 mg | ORAL_TABLET | Freq: Four times a day (QID) | ORAL | Status: DC | PRN

## 2018-09-17 MED ORDER — MIDAZOLAM HCL 2 MG/2ML IJ SOLN
INTRAMUSCULAR | Status: DC | PRN
  Administered 2018-09-17 (×2): 1 mg via INTRAVENOUS

## 2018-09-17 MED ORDER — ONDANSETRON HCL 4 MG/2ML IJ SOLN: 4.0000 mg | Freq: Once | INTRAMUSCULAR | Status: DC | PRN

## 2018-09-17 MED ORDER — SODIUM CHLORIDE 0.9 % IV SOLN: INTRAVENOUS | Status: DC

## 2018-09-17 MED ORDER — METOCLOPRAMIDE HCL 5 MG/ML IJ SOLN: 5.0000 mg | Freq: Three times a day (TID) | INTRAMUSCULAR | Status: DC | PRN

## 2018-09-17 MED ORDER — LIDOCAINE HCL 1 % IJ SOLN
INTRAMUSCULAR | Status: DC | PRN
  Administered 2018-09-17: 30 mL

## 2018-09-17 MED ORDER — FENTANYL CITRATE (PF) 100 MCG/2ML IJ SOLN: 25.0000 ug | INTRAMUSCULAR | Status: DC | PRN

## 2018-09-17 MED ORDER — IOPAMIDOL (ISOVUE-M 200) INJECTION 41%
INTRAMUSCULAR | Status: AC
  Filled 2018-09-17: qty 10

## 2018-09-17 MED ORDER — OXYCODONE-ACETAMINOPHEN 5-325 MG PO TABS: 1.0000 | ORAL_TABLET | Freq: Four times a day (QID) | ORAL | 0 refills | Status: DC | PRN

## 2018-09-17 MED ORDER — BUPIVACAINE-EPINEPHRINE (PF) 0.5% -1:200000 IJ SOLN
INTRAMUSCULAR | Status: AC
  Filled 2018-09-17: qty 30

## 2018-09-17 MED ORDER — IPRATROPIUM-ALBUTEROL 0.5-2.5 (3) MG/3ML IN SOLN
3.0000 mL | Freq: Once | RESPIRATORY_TRACT | Status: AC
  Administered 2018-09-17: 3 mL via RESPIRATORY_TRACT

## 2018-09-17 MED ORDER — LIDOCAINE HCL (PF) 1 % IJ SOLN
INTRAMUSCULAR | Status: AC
  Filled 2018-09-17: qty 30

## 2018-09-17 MED ORDER — LACTATED RINGERS IV SOLN
INTRAVENOUS | Status: DC
  Administered 2018-09-17 (×2): via INTRAVENOUS

## 2018-09-17 MED ORDER — ONDANSETRON HCL 4 MG/2ML IJ SOLN: 4.0000 mg | Freq: Four times a day (QID) | INTRAMUSCULAR | Status: DC | PRN

## 2018-09-17 MED ORDER — CLINDAMYCIN PHOSPHATE 900 MG/50ML IV SOLN
INTRAVENOUS | Status: AC
  Filled 2018-09-17: qty 50

## 2018-09-17 MED ORDER — PROPOFOL 500 MG/50ML IV EMUL
INTRAVENOUS | Status: DC | PRN
  Administered 2018-09-17: 25 ug/kg/min via INTRAVENOUS

## 2018-09-17 MED ORDER — PROPOFOL 10 MG/ML IV BOLUS
INTRAVENOUS | Status: AC
  Filled 2018-09-17: qty 40

## 2018-09-17 MED ORDER — METOCLOPRAMIDE HCL 10 MG PO TABS: 5.0000 mg | ORAL_TABLET | Freq: Three times a day (TID) | ORAL | Status: DC | PRN

## 2018-09-17 MED ORDER — FENTANYL CITRATE (PF) 100 MCG/2ML IJ SOLN
INTRAMUSCULAR | Status: AC
  Filled 2018-09-17: qty 2

## 2018-09-17 MED ORDER — CLINDAMYCIN PHOSPHATE 900 MG/50ML IV SOLN
900.0000 mg | Freq: Once | INTRAVENOUS | Status: AC
  Administered 2018-09-17: 900 mg via INTRAVENOUS

## 2018-09-17 MED ORDER — HYDROCODONE-ACETAMINOPHEN 5-325 MG PO TABS: 1.0000 | ORAL_TABLET | ORAL | Status: DC | PRN

## 2018-09-17 MED ORDER — IPRATROPIUM-ALBUTEROL 0.5-2.5 (3) MG/3ML IN SOLN
RESPIRATORY_TRACT | Status: AC
  Filled 2018-09-17: qty 3

## 2018-09-17 MED ORDER — BUPIVACAINE-EPINEPHRINE (PF) 0.5% -1:200000 IJ SOLN
INTRAMUSCULAR | Status: DC | PRN
  Administered 2018-09-17: 20 mL

## 2018-09-17 MED ORDER — MIDAZOLAM HCL 2 MG/2ML IJ SOLN
INTRAMUSCULAR | Status: AC
  Filled 2018-09-17: qty 2

## 2018-09-17 SURGICAL SUPPLY — 18 items
CEMENT KYPHON CX01A KIT/MIXER (Cement) ×2 IMPLANT
COVER WAND RF STERILE (DRAPES) ×2 IMPLANT
DERMABOND ADVANCED (GAUZE/BANDAGES/DRESSINGS) ×1
DERMABOND ADVANCED .7 DNX12 (GAUZE/BANDAGES/DRESSINGS) ×1 IMPLANT
DEVICE BIOPSY BONE KYPHX (INSTRUMENTS) ×2 IMPLANT
DRAPE C-ARM XRAY 36X54 (DRAPES) ×2 IMPLANT
DURAPREP 26ML APPLICATOR (WOUND CARE) ×2 IMPLANT
GLOVE SURG SYN 9.0  PF PI (GLOVE) ×1
GLOVE SURG SYN 9.0 PF PI (GLOVE) ×1 IMPLANT
GOWN SRG 2XL LVL 4 RGLN SLV (GOWNS) ×1 IMPLANT
GOWN STRL NON-REIN 2XL LVL4 (GOWNS) ×1
GOWN STRL REUS W/ TWL LRG LVL3 (GOWN DISPOSABLE) ×1 IMPLANT
GOWN STRL REUS W/TWL LRG LVL3 (GOWN DISPOSABLE) ×1
PACK KYPHOPLASTY (MISCELLANEOUS) ×2 IMPLANT
STRAP SAFETY 5IN WIDE (MISCELLANEOUS) ×2 IMPLANT
TRAY KYPHOPAK 15/2 EXPRESS (KITS) ×1 IMPLANT
TRAY KYPHOPAK 15/3 EXPRESS 1ST (MISCELLANEOUS) ×1 IMPLANT
TRAY KYPHOPAK 20/3 EXPRESS 1ST (MISCELLANEOUS) ×1 IMPLANT

## 2018-09-17 NOTE — Anesthesia Preprocedure Evaluation (Addendum)
Anesthesia Evaluation  Patient identified by MRN, date of birth, ID band Patient awake    Reviewed: Allergy & Precautions, H&P , NPO status , Patient's Chart, lab work & pertinent test results, reviewed documented beta blocker date and time   Airway Mallampati: II  TM Distance: >3 FB Neck ROM: full    Dental no notable dental hx. (+) Teeth Intact   Pulmonary neg pulmonary ROS, shortness of breath and with exertion, asthma , pneumonia, COPD, former smoker,  Pt with severe respiratory pathology and recently admitted for thoracic pain control from above.  Pts currently at baseline pulm. Function feeling reasonably comfortable and desires to proceed with sedation for above.  Risks and benefits fully discussed.  JA   Pulmonary exam normal breath sounds clear to auscultation       Cardiovascular Exercise Tolerance: Poor hypertension, On Medications + Past MI and +CHF  negative cardio ROS   Rhythm:regular Rate:Normal     Neuro/Psych  Headaches, PSYCHIATRIC DISORDERS Anxiety Depression negative neurological ROS  negative psych ROS   GI/Hepatic negative GI ROS, Neg liver ROS, hiatal hernia, PUD, GERD  Medicated,  Endo/Other  negative endocrine ROSdiabetes  Renal/GU      Musculoskeletal   Abdominal   Peds  Hematology negative hematology ROS (+)   Anesthesia Other Findings   Reproductive/Obstetrics negative OB ROS                            Anesthesia Physical Anesthesia Plan  ASA: IV  Anesthesia Plan: MAC   Post-op Pain Management:    Induction:   PONV Risk Score and Plan:   Airway Management Planned:   Additional Equipment:   Intra-op Plan:   Post-operative Plan:   Informed Consent: I have reviewed the patients History and Physical, chart, labs and discussed the procedure including the risks, benefits and alternatives for the proposed anesthesia with the patient or authorized  representative who has indicated his/her understanding and acceptance.     Plan Discussed with: CRNA  Anesthesia Plan Comments:         Anesthesia Quick Evaluation

## 2018-09-17 NOTE — Anesthesia Post-op Follow-up Note (Signed)
Anesthesia QCDR form completed.        

## 2018-09-17 NOTE — Discharge Instructions (Addendum)
Take it easy today and tomorrow, resume more normal activities on Saturday.  Remove Band-Aids on Saturday then okay to shower.  Pain medicine as directed ° °AMBULATORY SURGERY  °DISCHARGE INSTRUCTIONS ° ° °1) The drugs that you were given will stay in your system until tomorrow so for the next 24 hours you should not: ° °A) Drive an automobile °B) Make any legal decisions °C) Drink any alcoholic beverage ° ° °2) You may resume regular meals tomorrow.  Today it is better to start with liquids and gradually work up to solid foods. ° °You may eat anything you prefer, but it is better to start with liquids, then soup and crackers, and gradually work up to solid foods. ° ° °3) Please notify your doctor immediately if you have any unusual bleeding, trouble breathing, redness and pain at the surgery site, drainage, fever, or pain not relieved by medication. ° ° ° °4) Additional Instructions: ° ° ° ° ° ° ° °Please contact your physician with any problems or Same Day Surgery at [phone], Monday through Friday 6 am to 4 pm, or [place] [facility] Main number at [phone]. °

## 2018-09-17 NOTE — Op Note (Signed)
Date  09/17/2018  Time  9:36 am   PATIENT: Caitlyn Evans   PRE-OPERATIVE DIAGNOSIS:  closed wedge compression fracture of T6   POST-OPERATIVE DIAGNOSIS:  closed wedge compression fracture of T6   PROCEDURE:  Procedure(s): KYPHOPLASTY T6  SURGEON: Laurene Footman, MD   ASSISTANTS: None   ANESTHESIA:   local and MAC   EBL:  No intake/output data recorded.   BLOOD ADMINISTERED:none   DRAINS: none    LOCAL MEDICATIONS USED:  MARCAINE    and XYLOCAINE    SPECIMEN:  T6 vertebral body   DISPOSITION OF SPECIMEN:  Pathology   COUNTS:  YES   TOURNIQUET:  * No tourniquets in log *   IMPLANTS: Bone cement   DICTATION: .Dragon Dictation  patient was brought to the operating room and after adequate anesthesia was obtained the patient was placed prone.  C arm was brought in in good visualization of the affected level obtained on both AP and lateral projections.  After patient identification and timeout procedures were completed, local anesthetic was infiltrated with 10 cc 1% Xylocaine infiltrated subcutaneously.  This is done the area on the r both sides of the planned approach.  The back was then prepped and draped in the usual sterile manner and repeat timeout procedure carried out.  A spinal needle was brought down to the pedicle on the both sides of  T6 and a 50-50 mix of 1% Xylocaine half percent Sensorcaine with epinephrine total of 20 cc injected.  After allowing this to set a small incision was made and the trocar was advanced into the vertebral body in an extrapedicular fashion.  Biopsy was obtained Drilling was carried out balloon inserted with inflation to  2-1/2 cc.  The same procedure was repeated on the left side without biopsy taken and inflation of the balloon to 1 cc. When the cement was appropriate consistency for cc were injected into the vertebral body without extravasation, good fill superior to inferior endplates and from right to left sides along the inferior endplate.   After the cement had set the trochar was removed and permanent C-arm views obtained.  The wound was closed with Dermabond followed by Band-Aid   PLAN OF CARE: Discharge to home after PACU   PATIENT DISPOSITION:  PACU - hemodynamically stable.

## 2018-09-17 NOTE — Transfer of Care (Signed)
Immediate Anesthesia Transfer of Care Note  Patient: Caitlyn Evans  Procedure(s) Performed: KYPHOPLASTY T6 (N/A )  Patient Location: PACU  Anesthesia Type:MAC  Level of Consciousness: awake, alert  and oriented  Airway & Oxygen Therapy: Patient Spontanous Breathing and Patient connected to nasal cannula oxygen  Post-op Assessment: Report given to RN and Post -op Vital signs reviewed and stable  Post vital signs: Reviewed and stable  Last Vitals:  Vitals Value Taken Time  BP    Temp    Pulse    Resp    SpO2      Last Pain:  Vitals:   09/17/18 0635  TempSrc: Tympanic         Complications: No apparent anesthesia complications

## 2018-09-18 ENCOUNTER — Encounter: Payer: Self-pay | Admitting: Orthopedic Surgery

## 2018-09-18 DIAGNOSIS — J439 Emphysema, unspecified: Secondary | ICD-10-CM | POA: Diagnosis not present

## 2018-09-18 DIAGNOSIS — G43919 Migraine, unspecified, intractable, without status migrainosus: Secondary | ICD-10-CM | POA: Diagnosis not present

## 2018-09-18 DIAGNOSIS — J9611 Chronic respiratory failure with hypoxia: Secondary | ICD-10-CM | POA: Diagnosis not present

## 2018-09-18 LAB — SURGICAL PATHOLOGY

## 2018-09-18 NOTE — Anesthesia Postprocedure Evaluation (Signed)
Anesthesia Post Note  Patient: Caitlyn Evans  Procedure(s) Performed: KYPHOPLASTY T6 (N/A )  Patient location during evaluation: PACU Anesthesia Type: MAC Level of consciousness: awake and alert Pain management: pain level controlled Vital Signs Assessment: post-procedure vital signs reviewed and stable Respiratory status: spontaneous breathing, nonlabored ventilation, respiratory function stable and patient connected to nasal cannula oxygen Cardiovascular status: blood pressure returned to baseline and stable Postop Assessment: no apparent nausea or vomiting Anesthetic complications: no     Last Vitals:  Vitals:   09/17/18 1005 09/17/18 1017  BP: 124/62 124/73  Pulse: 74 83  Resp: 17 16  Temp:  36.6 C  SpO2: 94% 94%    Last Pain:  Vitals:   09/17/18 1017  TempSrc: Temporal  PainSc: 0-No pain                 Molli Barrows

## 2018-09-19 DIAGNOSIS — J441 Chronic obstructive pulmonary disease with (acute) exacerbation: Secondary | ICD-10-CM | POA: Diagnosis not present

## 2018-09-21 DIAGNOSIS — J439 Emphysema, unspecified: Secondary | ICD-10-CM | POA: Diagnosis not present

## 2018-09-21 DIAGNOSIS — K222 Esophageal obstruction: Secondary | ICD-10-CM | POA: Diagnosis not present

## 2018-09-21 DIAGNOSIS — Z9189 Other specified personal risk factors, not elsewhere classified: Secondary | ICD-10-CM | POA: Diagnosis not present

## 2018-09-21 DIAGNOSIS — J418 Mixed simple and mucopurulent chronic bronchitis: Secondary | ICD-10-CM | POA: Diagnosis not present

## 2018-09-22 ENCOUNTER — Emergency Department: Payer: Medicare Other

## 2018-09-22 ENCOUNTER — Inpatient Hospital Stay
Admission: EM | Admit: 2018-09-22 | Discharge: 2018-09-26 | DRG: 871 | Disposition: A | Discharge status: Home Health | Payer: Medicare Other | Attending: Internal Medicine | Admitting: Internal Medicine

## 2018-09-22 ENCOUNTER — Other Ambulatory Visit: Payer: Self-pay

## 2018-09-22 ENCOUNTER — Encounter: Payer: Self-pay | Admitting: Emergency Medicine

## 2018-09-22 DIAGNOSIS — J441 Chronic obstructive pulmonary disease with (acute) exacerbation: Secondary | ICD-10-CM

## 2018-09-22 DIAGNOSIS — E7800 Pure hypercholesterolemia, unspecified: Secondary | ICD-10-CM | POA: Diagnosis present

## 2018-09-22 DIAGNOSIS — A419 Sepsis, unspecified organism: Secondary | ICD-10-CM | POA: Diagnosis not present

## 2018-09-22 DIAGNOSIS — M546 Pain in thoracic spine: Secondary | ICD-10-CM | POA: Diagnosis not present

## 2018-09-22 DIAGNOSIS — Z515 Encounter for palliative care: Secondary | ICD-10-CM | POA: Diagnosis present

## 2018-09-22 DIAGNOSIS — J189 Pneumonia, unspecified organism: Secondary | ICD-10-CM | POA: Diagnosis present

## 2018-09-22 DIAGNOSIS — R0602 Shortness of breath: Secondary | ICD-10-CM

## 2018-09-22 DIAGNOSIS — J9621 Acute and chronic respiratory failure with hypoxia: Secondary | ICD-10-CM | POA: Diagnosis present

## 2018-09-22 DIAGNOSIS — E669 Obesity, unspecified: Secondary | ICD-10-CM | POA: Diagnosis present

## 2018-09-22 DIAGNOSIS — Y92009 Unspecified place in unspecified non-institutional (private) residence as the place of occurrence of the external cause: Secondary | ICD-10-CM | POA: Diagnosis not present

## 2018-09-22 DIAGNOSIS — J302 Other seasonal allergic rhinitis: Secondary | ICD-10-CM | POA: Diagnosis not present

## 2018-09-22 DIAGNOSIS — I110 Hypertensive heart disease with heart failure: Secondary | ICD-10-CM | POA: Diagnosis present

## 2018-09-22 DIAGNOSIS — I5033 Acute on chronic diastolic (congestive) heart failure: Secondary | ICD-10-CM | POA: Diagnosis present

## 2018-09-22 DIAGNOSIS — W19XXXA Unspecified fall, initial encounter: Secondary | ICD-10-CM | POA: Diagnosis present

## 2018-09-22 DIAGNOSIS — Z825 Family history of asthma and other chronic lower respiratory diseases: Secondary | ICD-10-CM

## 2018-09-22 DIAGNOSIS — M4854XA Collapsed vertebra, not elsewhere classified, thoracic region, initial encounter for fracture: Secondary | ICD-10-CM | POA: Diagnosis present

## 2018-09-22 DIAGNOSIS — J439 Emphysema, unspecified: Secondary | ICD-10-CM | POA: Diagnosis not present

## 2018-09-22 DIAGNOSIS — I509 Heart failure, unspecified: Secondary | ICD-10-CM

## 2018-09-22 DIAGNOSIS — Z79891 Long term (current) use of opiate analgesic: Secondary | ICD-10-CM

## 2018-09-22 DIAGNOSIS — I252 Old myocardial infarction: Secondary | ICD-10-CM | POA: Diagnosis not present

## 2018-09-22 DIAGNOSIS — Z8249 Family history of ischemic heart disease and other diseases of the circulatory system: Secondary | ICD-10-CM

## 2018-09-22 DIAGNOSIS — G43909 Migraine, unspecified, not intractable, without status migrainosus: Secondary | ICD-10-CM | POA: Diagnosis present

## 2018-09-22 DIAGNOSIS — J449 Chronic obstructive pulmonary disease, unspecified: Secondary | ICD-10-CM | POA: Diagnosis present

## 2018-09-22 DIAGNOSIS — Z87891 Personal history of nicotine dependence: Secondary | ICD-10-CM

## 2018-09-22 DIAGNOSIS — Z6827 Body mass index (BMI) 27.0-27.9, adult: Secondary | ICD-10-CM

## 2018-09-22 DIAGNOSIS — J9600 Acute respiratory failure, unspecified whether with hypoxia or hypercapnia: Secondary | ICD-10-CM | POA: Diagnosis not present

## 2018-09-22 DIAGNOSIS — D638 Anemia in other chronic diseases classified elsewhere: Secondary | ICD-10-CM | POA: Diagnosis present

## 2018-09-22 DIAGNOSIS — Z9981 Dependence on supplemental oxygen: Secondary | ICD-10-CM | POA: Diagnosis not present

## 2018-09-22 DIAGNOSIS — G8929 Other chronic pain: Secondary | ICD-10-CM | POA: Diagnosis not present

## 2018-09-22 DIAGNOSIS — R296 Repeated falls: Secondary | ICD-10-CM | POA: Diagnosis present

## 2018-09-22 DIAGNOSIS — Z885 Allergy status to narcotic agent status: Secondary | ICD-10-CM

## 2018-09-22 DIAGNOSIS — J440 Chronic obstructive pulmonary disease with acute lower respiratory infection: Secondary | ICD-10-CM | POA: Diagnosis present

## 2018-09-22 DIAGNOSIS — J8489 Other specified interstitial pulmonary diseases: Secondary | ICD-10-CM | POA: Diagnosis not present

## 2018-09-22 DIAGNOSIS — Z7189 Other specified counseling: Secondary | ICD-10-CM | POA: Diagnosis not present

## 2018-09-22 DIAGNOSIS — I10 Essential (primary) hypertension: Secondary | ICD-10-CM | POA: Diagnosis not present

## 2018-09-22 DIAGNOSIS — Y95 Nosocomial condition: Secondary | ICD-10-CM | POA: Diagnosis present

## 2018-09-22 DIAGNOSIS — K219 Gastro-esophageal reflux disease without esophagitis: Secondary | ICD-10-CM | POA: Diagnosis present

## 2018-09-22 DIAGNOSIS — Z880 Allergy status to penicillin: Secondary | ICD-10-CM

## 2018-09-22 DIAGNOSIS — F419 Anxiety disorder, unspecified: Secondary | ICD-10-CM | POA: Diagnosis present

## 2018-09-22 DIAGNOSIS — Z79899 Other long term (current) drug therapy: Secondary | ICD-10-CM

## 2018-09-22 LAB — CBC
HCT: 37.3 % (ref 36.0–46.0)
Hemoglobin: 11.1 g/dL — ABNORMAL LOW (ref 12.0–15.0)
MCH: 26.5 pg (ref 26.0–34.0)
MCHC: 29.8 g/dL — ABNORMAL LOW (ref 30.0–36.0)
MCV: 89 fL (ref 80.0–100.0)
Platelets: 253 10*3/uL (ref 150–400)
RBC: 4.19 MIL/uL (ref 3.87–5.11)
RDW: 17.8 % — ABNORMAL HIGH (ref 11.5–15.5)
WBC: 31.8 10*3/uL — AB (ref 4.0–10.5)
nRBC: 0.1 % (ref 0.0–0.2)

## 2018-09-22 LAB — BASIC METABOLIC PANEL
Anion gap: 8 (ref 5–15)
BUN: 12 mg/dL (ref 6–20)
CO2: 30 mmol/L (ref 22–32)
Calcium: 8.8 mg/dL — ABNORMAL LOW (ref 8.9–10.3)
Chloride: 102 mmol/L (ref 98–111)
Creatinine, Ser: 0.65 mg/dL (ref 0.44–1.00)
Glucose, Bld: 117 mg/dL — ABNORMAL HIGH (ref 70–99)
Potassium: 3.9 mmol/L (ref 3.5–5.1)
Sodium: 140 mmol/L (ref 135–145)

## 2018-09-22 LAB — BRAIN NATRIURETIC PEPTIDE: B NATRIURETIC PEPTIDE 5: 68 pg/mL (ref 0.0–100.0)

## 2018-09-22 LAB — CG4 I-STAT (LACTIC ACID): LACTIC ACID, VENOUS: 1.23 mmol/L (ref 0.5–1.9)

## 2018-09-22 LAB — TROPONIN I: Troponin I: <0.03 ng/mL (ref <0.03)

## 2018-09-22 MED ORDER — IBUPROFEN 400 MG PO TABS: 400.0000 mg | ORAL_TABLET | Freq: Four times a day (QID) | ORAL | Status: DC | PRN

## 2018-09-22 MED ORDER — SODIUM CHLORIDE 0.9% FLUSH
3.0000 mL | Freq: Two times a day (BID) | INTRAVENOUS | Status: DC
  Administered 2018-09-23 – 2018-09-25 (×6): 3 mL via INTRAVENOUS

## 2018-09-22 MED ORDER — METHYLPREDNISOLONE SODIUM SUCC 125 MG IJ SOLR
125.0000 mg | Freq: Once | INTRAMUSCULAR | Status: AC
  Administered 2018-09-22: 125 mg via INTRAVENOUS
  Filled 2018-09-22: qty 2

## 2018-09-22 MED ORDER — FUROSEMIDE 10 MG/ML IJ SOLN
40.0000 mg | Freq: Once | INTRAMUSCULAR | Status: AC
  Administered 2018-09-22: 40 mg via INTRAVENOUS
  Filled 2018-09-22: qty 4

## 2018-09-22 MED ORDER — SODIUM CHLORIDE 0.9 % IV SOLN
2.0000 g | Freq: Three times a day (TID) | INTRAVENOUS | Status: DC
  Administered 2018-09-23: 2 g via INTRAVENOUS
  Filled 2018-09-22 (×3): qty 2

## 2018-09-22 MED ORDER — MORPHINE SULFATE (PF) 4 MG/ML IV SOLN
4.0000 mg | Freq: Once | INTRAVENOUS | Status: AC
  Administered 2018-09-22: 4 mg via INTRAVENOUS
  Filled 2018-09-22: qty 1

## 2018-09-22 MED ORDER — VANCOMYCIN HCL IN DEXTROSE 1-5 GM/200ML-% IV SOLN
1000.0000 mg | Freq: Once | INTRAVENOUS | Status: AC
  Administered 2018-09-22: 1000 mg via INTRAVENOUS
  Filled 2018-09-22: qty 200

## 2018-09-22 MED ORDER — POLYETHYLENE GLYCOL 3350 17 G PO PACK: 17.0000 g | PACK | Freq: Every day | ORAL | Status: DC | PRN

## 2018-09-22 MED ORDER — ACETAMINOPHEN 650 MG RE SUPP: 650.0000 mg | Freq: Four times a day (QID) | RECTAL | Status: DC | PRN

## 2018-09-22 MED ORDER — IPRATROPIUM-ALBUTEROL 0.5-2.5 (3) MG/3ML IN SOLN
9.0000 mL | Freq: Once | RESPIRATORY_TRACT | Status: AC
  Administered 2018-09-22: 9 mL via RESPIRATORY_TRACT
  Filled 2018-09-22: qty 3

## 2018-09-22 MED ORDER — LORAZEPAM 2 MG/ML IJ SOLN
0.5000 mg | Freq: Four times a day (QID) | INTRAMUSCULAR | Status: DC | PRN
  Administered 2018-09-23 – 2018-09-24 (×4): 0.5 mg via INTRAVENOUS
  Filled 2018-09-22 (×4): qty 1

## 2018-09-22 MED ORDER — OXYCODONE-ACETAMINOPHEN 5-325 MG PO TABS
1.0000 | ORAL_TABLET | Freq: Four times a day (QID) | ORAL | Status: DC | PRN
  Administered 2018-09-23 – 2018-09-25 (×4): 1 via ORAL
  Filled 2018-09-22 (×4): qty 1

## 2018-09-22 MED ORDER — ALBUTEROL SULFATE (2.5 MG/3ML) 0.083% IN NEBU: 2.5000 mg | INHALATION_SOLUTION | RESPIRATORY_TRACT | Status: DC | PRN

## 2018-09-22 MED ORDER — SODIUM CHLORIDE 0.9 % IV SOLN
2.0000 g | Freq: Once | INTRAVENOUS | Status: AC
  Administered 2018-09-22: 2 g via INTRAVENOUS
  Filled 2018-09-22: qty 2

## 2018-09-22 MED ORDER — ONDANSETRON HCL 4 MG PO TABS: 4.0000 mg | ORAL_TABLET | Freq: Four times a day (QID) | ORAL | Status: DC | PRN

## 2018-09-22 MED ORDER — VANCOMYCIN HCL IN DEXTROSE 1-5 GM/200ML-% IV SOLN
1000.0000 mg | INTRAVENOUS | Status: DC
  Administered 2018-09-23: 1000 mg via INTRAVENOUS
  Filled 2018-09-22: qty 200

## 2018-09-22 MED ORDER — KETOROLAC TROMETHAMINE 30 MG/ML IJ SOLN: 30.0000 mg | Freq: Four times a day (QID) | INTRAMUSCULAR | Status: DC | PRN

## 2018-09-22 MED ORDER — FUROSEMIDE 10 MG/ML IJ SOLN: 40.0000 mg | Freq: Two times a day (BID) | INTRAMUSCULAR | Status: DC

## 2018-09-22 MED ORDER — ONDANSETRON HCL 4 MG/2ML IJ SOLN: 4.0000 mg | Freq: Four times a day (QID) | INTRAMUSCULAR | Status: DC | PRN

## 2018-09-22 MED ORDER — ACETAMINOPHEN 325 MG PO TABS: 650.0000 mg | ORAL_TABLET | Freq: Four times a day (QID) | ORAL | Status: DC | PRN

## 2018-09-22 MED ORDER — ENOXAPARIN SODIUM 40 MG/0.4ML ~~LOC~~ SOLN
40.0000 mg | SUBCUTANEOUS | Status: DC
  Filled 2018-09-22 (×2): qty 0.4

## 2018-09-22 MED ORDER — LORAZEPAM 2 MG/ML IJ SOLN
INTRAMUSCULAR | Status: AC
  Administered 2018-09-22: 1 mg
  Filled 2018-09-22: qty 1

## 2018-09-22 NOTE — ED Provider Notes (Signed)
[facility] Emergency Department Provider Note  ____________________________________________   First MD Initiated Contact with Patient 09/22/18 2016     (approximate)  I have reviewed the triage vital signs and the nursing notes.   HISTORY  Chief Complaint Shortness of Breath   HPI Caitlyn Evans is a 53 y.o. female with history of CHF as well as COPD on home oxygen who was presented emergency department with 2 days of worsening shortness of breath.  Patient says that she also had a recent kyphoplasty at T6 this past Thursday and is also having upper back pain at this time.  Not reporting chest pain.  She is here with family who states that the patient is still smoking.   Past Medical History:  Diagnosis Date  . Anxiety    panic attacks  . Asthma   . BOOP (bronchiolitis obliterans with organizing pneumonia) (HCC)   . CHF (congestive heart failure) (HCC)   . Dysphagia, pharyngoesophageal phase 05/30/2015  . Emphysema lung (HCC) 05/30/2015   on steroids and nebulizer for copd issues.  09/15/18  . GERD (gastroesophageal reflux disease)   . Hypercholesterolemia   . Hypertension   . Lung mass   . Migraines   . Motion sickness    all moving vehicles  . Myocardial infarction (HCC) 2012  . Respiratory failure with hypercapnia (HCC) 08/2018   was hospitalized and on bipap. steroids and nebulizer treatments helped  . Seasonal allergies   . Shortness of breath dyspnea    1 flight-stairs  . Sternal fracture     Patient Active Problem List   Diagnosis Date Noted  . Acute on chronic respiratory failure with hypoxia (HCC) 09/09/2018  . Esophagitis, unspecified   . Stomach irritation   . Hiatal hernia   . COPD (chronic obstructive pulmonary disease) (HCC) 07/19/2016  . Acute on chronic respiratory failure (HCC) 05/09/2016  . Noncompliance with medication regimen 05/09/2016  . COPD with acute exacerbation (HCC) 02/05/2016  . Acute exacerbation of  chronic obstructive pulmonary disease (COPD) (HCC)   . Acute respiratory failure (HCC)   . Acute respiratory failure with hypoxia (HCC) 12/05/2015  . BOOP (bronchiolitis obliterans with organizing pneumonia) (HCC) 12/05/2015  . Depression, major, single episode, moderate (HCC) 07/14/2015  . Adjustment disorder with anxiety 07/14/2015  . COPD exacerbation (HCC) 07/14/2015  . Special screening for malignant neoplasms, colon   . Swallowing difficulty   . Loss of weight   . Esophagogastric ulcer   . Feline esophagus   . Hypertension 05/30/2015  . Emphysema lung (HCC) 05/30/2015  . Dysphagia, pharyngoesophageal phase 05/30/2015    Past Surgical History:  Procedure Laterality Date  . COLONOSCOPY WITH PROPOFOL N/A 06/02/2015   Procedure: COLONOSCOPY WITH PROPOFOL;  Surgeon: Midge Miniumarren Wohl, MD;  [facility];  Service: Endoscopy;  Laterality: N/A;  . ESOPHAGOGASTRODUODENOSCOPY (EGD) WITH PROPOFOL N/A 06/02/2015   Procedure: ESOPHAGOGASTRODUODENOSCOPY (EGD) WITH PROPOFOL withdialation;  Surgeon: Midge Miniumarren Wohl, MD;  [facility];  Service: Endoscopy;  Laterality: N/A;  . ESOPHAGOGASTRODUODENOSCOPY (EGD) WITH PROPOFOL N/A 11/05/2017   Procedure: ESOPHAGOGASTRODUODENOSCOPY (EGD) WITH PROPOFOL;  Surgeon: Pasty Spillersahiliani, Varnita B, MD;  Location: [facility];  Service: Endoscopy;  Laterality: N/A;  . KYPHOPLASTY N/A 09/17/2018   Procedure: KYPHOPLASTY T6;  Surgeon: Kennedy BuckerMenz, Michael, MD;  Location: [facility];  Service: Orthopedics;  Laterality: N/A;  . LUNG SURGERY Left    Upper lobe removed  . OOPHORECTOMY Left   . VAGINAL HYSTERECTOMY      Prior to  Admission medications   Medication Sig Start Date End Date Taking? Authorizing Provider  ALPRAZolam Prudy Feeler) 0.5 MG tablet Take 0.5 mg by mouth 3 (three) times daily.    Yes [provider]  diltiazem (CARDIZEM CD) 180 MG 24 hr capsule Take 1 capsule (180 mg total) by mouth daily. 11/26/17  Yes Enid Baas, MD    donepezil (ARICEPT) 10 MG tablet Take 10 mg by mouth daily.    Yes [provider]  furosemide (LASIX) 20 MG tablet Take 1 tablet (20 mg total) by mouth daily. 11/26/17  Yes Enid Baas, MD  ipratropium-albuterol (DUONEB) 0.5-2.5 (3) MG/3ML SOLN Inhale 3 mLs into the lungs every 4 (four) hours as needed (for wheezing/shortness of breath). Patient taking differently: Inhale 3 mLs into the lungs every 4 (four) hours as needed. For wheezing/shortness of breath 05/13/16  Yes Enid Baas, MD  metoprolol tartrate (LOPRESSOR) 25 MG tablet Take 1 tablet (25 mg total) by mouth 2 (two) times daily. 11/25/17  Yes Enid Baas, MD  oxyCODONE-acetaminophen (PERCOCET) 5-325 MG tablet Take 1 tablet by mouth every 6 (six) hours as needed for severe pain. 09/17/18 09/17/19 Yes Kennedy Bucker, MD  pantoprazole (PROTONIX) 40 MG tablet TAKE ONE TABLET BY MOUTH TWICE DAILY Patient taking differently: Take 40 mg by mouth 2 (two) times daily.  04/03/18  Yes Melodie Bouillon B, MD  rosuvastatin (CRESTOR) 10 MG tablet Take 10 mg by mouth daily.  10/10/17  Yes [provider]  tamsulosin (FLOMAX) 0.4 MG CAPS capsule Take 1 capsule (0.4 mg total) by mouth daily. 11/26/17  Yes Enid Baas, MD  predniSONE (DELTASONE) 10 MG tablet Take 1 tablet (10 mg total) by mouth daily. Label  & dispense according to the schedule below.  6 tablets day one, then 5 table day 2, then 4 tablets day 3, then 3 tablets day 4, 2 tablets day 5, then 1 tablet day 6, then stop 09/15/18   Ihor Austin, MD    Allergies Codeine and Penicillins  Family History  Problem Relation Age of Onset  . COPD Mother   . Heart attack Mother   . Stroke Father   . COPD Father     Social History Social History   Tobacco Use  . Smoking status: Former Smoker    Packs/day: 1.00    Years: 30.00    Pack years: 30.00    Types: Cigarettes  . Smokeless tobacco: Never Used  . Tobacco comment: quit some time in 2012   Substance Use Topics  . Alcohol use: No    Alcohol/week: 0.0 standard drinks    Comment: rare consumption  . Drug use: No    Review of Systems  Constitutional: No fever/chills Eyes: No visual changes. ENT: No sore throat. Cardiovascular: Anterior chest pressure across the front of the chest. Respiratory: As above Gastrointestinal: No abdominal pain.  No nausea, no vomiting.  No diarrhea.  No constipation. Genitourinary: Negative for dysuria. Musculoskeletal: As above Skin: Negative for rash. Neurological: Negative for headaches, focal weakness or numbness.   ____________________________________________   PHYSICAL EXAM:  VITAL SIGNS: ED Triage Vitals  Enc Vitals Group     BP 09/22/18 1814 133/83     Pulse Rate 09/22/18 1814 88     Resp 09/22/18 1814 (!) 30     Temp 09/22/18 1814 98.7 F (37.1 C)     Temp Source 09/22/18 1814 Oral     SpO2 09/22/18 1814 98 %     Weight 09/22/18 1829 133 lb (60.3  kg)     Height 09/22/18 1829 4\' 10"  (1.473 m)     Head Circumference --      Peak Flow --      Pain Score 09/22/18 1811 10     Pain Loc --      Pain Edu? --      Excl. in GC? --     Constitutional: Alert and oriented.  Patient appears ill with severe respiratory distress.  Speaking in 3-4 word sentences. Eyes: Conjunctivae are normal.  Head: Atraumatic. Nose: No congestion/rhinnorhea. Mouth/Throat: Mucous membranes are moist.  Neck: No stridor.   Cardiovascular: Normal rate, regular rhythm. Grossly normal heart sounds.   Respiratory: Labored respirations with supraclavicular retractions.  Diffuse wheezing with associated rales.  Prolonged expiratory phase. Gastrointestinal: Soft and nontender. No distention. Musculoskeletal: Bilateral moderate lower extremity edema.  Mild tenderness to palpation over the upper thoracic spine.  However, the incision site has dressings that are CDI.  No exudate.  No surrounding induration, erythema. Neurologic:  Normal speech and  language. No gross focal neurologic deficits are appreciated. Skin:  Skin is warm, dry and intact. No rash noted.  ____________________________________________   LABS (all labs ordered are listed, but only abnormal results are displayed)  Labs Reviewed  BASIC METABOLIC PANEL - Abnormal; Notable for the following components:      Result Value   Glucose, Bld 117 (*)    Calcium 8.8 (*)    All other components within normal limits  CBC - Abnormal; Notable for the following components:   WBC 31.8 (*)    Hemoglobin 11.1 (*)    MCHC 29.8 (*)    RDW 17.8 (*)    All other components within normal limits  CULTURE, BLOOD (ROUTINE X 2)  CULTURE, BLOOD (ROUTINE X 2)  TROPONIN I  BRAIN NATRIURETIC PEPTIDE  URINALYSIS, ROUTINE W REFLEX MICROSCOPIC  BASIC METABOLIC PANEL  CBC  I-STAT CG4 LACTIC ACID, ED  I-STAT CG4 LACTIC ACID, ED   ____________________________________________  EKG  ED ECG REPORT I, Arelia Longest, the attending physician, personally viewed and interpreted this ECG.   Date: 09/22/2018  EKG Time: 1815  Rate: 89  Rhythm: normal sinus rhythm  Axis: Normal  Intervals:none  ST&T Change: No ST segment elevation or depression.  No abnormal T wave inversion.  Initial EKG at 1811 read as a STEMI by the EKG machine.  However, there was severe baseline disturbance/static and I believe the read to be incorrect.  Above EKG at 1815 was the repeat.  ____________________________________________  RADIOLOGY  New ovoid opacity in the right upper lobe measuring 19 x 10 mm. ____________________________________________   PROCEDURES  Procedure(s) performed:   .Critical Care Performed by: Myrna Blazer, MD Authorized by: Myrna Blazer, MD   Critical care provider statement:    Critical care time (minutes):  35   Critical care time was exclusive of:  Separately billable procedures and treating other patients   Critical care was necessary to treat or  prevent imminent or life-threatening deterioration of the following conditions:  Respiratory failure   Critical care was time spent personally by me on the following activities:  Development of treatment plan with patient or surrogate, discussions with consultants, evaluation of patient's response to treatment, examination of patient, obtaining history from patient or surrogate, ordering and performing treatments and interventions, ordering and review of laboratory studies, ordering and review of radiographic studies, pulse oximetry, re-evaluation of patient's condition and review of old charts    Critical  Care performed:     ____________________________________________   INITIAL IMPRESSION / ASSESSMENT AND PLAN / ED COURSE  Pertinent labs & imaging results that were available during my care of the patient were reviewed by me and considered in my medical decision making (see chart for details).  Differential includes, but is not limited to, viral syndrome, bronchitis including COPD exacerbation, pneumonia, reactive airway disease including asthma, CHF including exacerbation with or without pulmonary/interstitial edema, pneumothorax, ACS, thoracic trauma, and pulmonary embolism. As part of my medical decision making, I reviewed the following data within the electronic MEDICAL RECORD NUMBERNotes from recent surgery as well as past ER visits.  ----------------------------------------- 8:44 PM on 09/22/2018 -----------------------------------------  Patient with elevated white blood cell count.  New finding on chest x-ray.  Will treat for H CAP.  Patient tolerating the BiPAP well and says that it is improving her respiratory status.  She looks more comfortable with the BiPAP.  Unfortunately, we do not have any ICU beds of [place] that are available at this time.  [facility] [place] [place] [facility] who all refused transfer secondary to capacity issues.  I discussed case Dr.  Elpidio AnisSudini of the medicine service here at [facility] will be putting in ICU orders and will maintain the patient here in the emergency department.  Patient and family aware of diagnosis well treatment and willing to comply.  ____________________________________________   FINAL CLINICAL IMPRESSION(S) / ED DIAGNOSES  Final diagnoses:  SOB (shortness of breath)   CHF.  COPD.  H CAP.   NEW MEDICATIONS STARTED DURING THIS VISIT:  New Prescriptions   No medications on file     Note:  This document was prepared using Dragon voice recognition software and may include unintentional dictation errors.     Myrna BlazerSchaevitz, Zyon Rosser Matthew, MD 09/22/18 2045

## 2018-09-22 NOTE — ED Triage Notes (Signed)
PT c/o worsening SOB, hx of COPD. PT has audible wheezes. PT had recent back surgery xfew days ago. PT on 3L [place] chronic.

## 2018-09-22 NOTE — Progress Notes (Signed)
CODE SEPSIS - PHARMACY COMMUNICATION  **Broad Spectrum Antibiotics should be administered within 1 hour of Sepsis diagnosis**  Time Code Sepsis Called/Page Received: 09/22/18 @1924    Antibiotics Ordered: cefepime and vancomycin   Time of 1st antibiotic administration: 09/22/18 @2024   Additional action taken by pharmacy: none   Ronnald Ramp ,PharmD, BCPS Clinical Pharmacist  09/22/2018  7:35 PM

## 2018-09-22 NOTE — H&P (Signed)
[facility] - [place] [facility]   PATIENT NAME: Caitlyn Evans    MR#:  [id number]  DATE OF BIRTH:  November 21, 1965  DATE OF ADMISSION:  09/22/2018  PRIMARY CARE PHYSICIAN: Corky DownsMasoud, Javed, MD   REQUESTING/REFERRING PHYSICIAN: Dr. Pershing ProudSchaevitz  CHIEF COMPLAINT:   Chief Complaint  Patient presents with  . Shortness of Breath    HISTORY OF PRESENT ILLNESS:  Caitlyn Evans  is a 53 y.o. female with a known history of COPD, Boop, chronic back pain with recurrent admissions to the hospital for respiratory problems and back issues presents again with worsening shortness of breath.  Patient extremely tachypneic and wheezy in spite of multiple breathing treatments and steroids.  Placed on BiPAP. Chest x-ray with chronic changes. Chest x-ray shows right upper lobe mass versus infiltrate.  PAST MEDICAL HISTORY:   Past Medical History:  Diagnosis Date  . Anxiety    panic attacks  . Asthma   . BOOP (bronchiolitis obliterans with organizing pneumonia) (HCC)   . CHF (congestive heart failure) (HCC)   . Dysphagia, pharyngoesophageal phase 05/30/2015  . Emphysema lung (HCC) 05/30/2015   on steroids and nebulizer for copd issues.  09/15/18  . GERD (gastroesophageal reflux disease)   . Hypercholesterolemia   . Hypertension   . Lung mass   . Migraines   . Motion sickness    all moving vehicles  . Myocardial infarction (HCC) 2012  . Respiratory failure with hypercapnia (HCC) 08/2018   was hospitalized and on bipap. steroids and nebulizer treatments helped  . Seasonal allergies   . Shortness of breath dyspnea    1 flight-stairs  . Sternal fracture     PAST SURGICAL HISTORY:   Past Surgical History:  Procedure Laterality Date  . COLONOSCOPY WITH PROPOFOL N/A 06/02/2015   Procedure: COLONOSCOPY WITH PROPOFOL;  Surgeon: Midge Miniumarren Wohl, MD;  [facility];  Service: Endoscopy;  Laterality: N/A;  . ESOPHAGOGASTRODUODENOSCOPY (EGD) WITH PROPOFOL N/A 06/02/2015   Procedure: ESOPHAGOGASTRODUODENOSCOPY (EGD) WITH PROPOFOL withdialation;  Surgeon: Midge Miniumarren Wohl, MD;  [facility];  Service: Endoscopy;  Laterality: N/A;  . ESOPHAGOGASTRODUODENOSCOPY (EGD) WITH PROPOFOL N/A 11/05/2017   Procedure: ESOPHAGOGASTRODUODENOSCOPY (EGD) WITH PROPOFOL;  Surgeon: Pasty Spillersahiliani, Varnita B, MD;  Location: [facility];  Service: Endoscopy;  Laterality: N/A;  . KYPHOPLASTY N/A 09/17/2018   Procedure: KYPHOPLASTY T6;  Surgeon: Kennedy BuckerMenz, Michael, MD;  Location: [facility];  Service: Orthopedics;  Laterality: N/A;  . LUNG SURGERY Left    Upper lobe removed  . OOPHORECTOMY Left   . VAGINAL HYSTERECTOMY      SOCIAL HISTORY:   Social History   Tobacco Use  . Smoking status: Former Smoker    Packs/day: 1.00    Years: 30.00    Pack years: 30.00    Types: Cigarettes  . Smokeless tobacco: Never Used  . Tobacco comment: quit some time in 2012  Substance Use Topics  . Alcohol use: No    Alcohol/week: 0.0 standard drinks    Comment: rare consumption    FAMILY HISTORY:   Family History  Problem Relation Age of Onset  . COPD Mother   . Heart attack Mother   . Stroke Father   . COPD Father     DRUG ALLERGIES:   Allergies  Allergen Reactions  . Codeine Hives, Nausea And Vomiting and Nausea Only  . Penicillins Hives and Other (See Comments)    Has patient had a PCN reaction causing immediate rash, facial/tongue/throat swelling, SOB or lightheadedness with hypotension: No  Has patient had a PCN reaction causing severe rash involving mucus membranes or skin necrosis: No Has patient had a PCN reaction that required hospitalization No Has patient had a PCN reaction occurring within the last 10 years: No If all of the above answers are "NO", then may proceed with Cephalosporin use. Other reaction(s): Other (See Comments) Has patient had a PCN    REVIEW OF SYSTEMS:   ROS  MEDICATIONS AT HOME:   Prior to Admission medications   Medication Sig Start Date  End Date Taking? Authorizing Provider  ALPRAZolam Prudy Feeler(XANAX) 0.5 MG tablet Take 0.5 mg by mouth 3 (three) times daily.    Yes [provider]  diltiazem (CARDIZEM CD) 180 MG 24 hr capsule Take 1 capsule (180 mg total) by mouth daily. 11/26/17  Yes Enid BaasKalisetti, Radhika, MD  donepezil (ARICEPT) 10 MG tablet Take 10 mg by mouth daily.    Yes [provider]  furosemide (LASIX) 20 MG tablet Take 1 tablet (20 mg total) by mouth daily. 11/26/17  Yes Enid BaasKalisetti, Radhika, MD  ipratropium-albuterol (DUONEB) 0.5-2.5 (3) MG/3ML SOLN Inhale 3 mLs into the lungs every 4 (four) hours as needed (for wheezing/shortness of breath). Patient taking differently: Inhale 3 mLs into the lungs every 4 (four) hours as needed. For wheezing/shortness of breath 05/13/16  Yes Enid BaasKalisetti, Radhika, MD  metoprolol tartrate (LOPRESSOR) 25 MG tablet Take 1 tablet (25 mg total) by mouth 2 (two) times daily. 11/25/17  Yes Enid BaasKalisetti, Radhika, MD  oxyCODONE-acetaminophen (PERCOCET) 5-325 MG tablet Take 1 tablet by mouth every 6 (six) hours as needed for severe pain. 09/17/18 09/17/19 Yes Kennedy BuckerMenz, Michael, MD  pantoprazole (PROTONIX) 40 MG tablet TAKE ONE TABLET BY MOUTH TWICE DAILY Patient taking differently: Take 40 mg by mouth 2 (two) times daily.  04/03/18  Yes Melodie Bouillonahiliani, Varnita B, MD  rosuvastatin (CRESTOR) 10 MG tablet Take 10 mg by mouth daily.  10/10/17  Yes [provider]  tamsulosin (FLOMAX) 0.4 MG CAPS capsule Take 1 capsule (0.4 mg total) by mouth daily. 11/26/17  Yes Enid BaasKalisetti, Radhika, MD  predniSONE (DELTASONE) 10 MG tablet Take 1 tablet (10 mg total) by mouth daily. Label  & dispense according to the schedule below.  6 tablets day one, then 5 table day 2, then 4 tablets day 3, then 3 tablets day 4, 2 tablets day 5, then 1 tablet day 6, then stop 09/15/18   Ihor AustinPyreddy, Pavan, MD     VITAL SIGNS:  Blood pressure 124/86, pulse 79, temperature 98.7 F (37.1 C), temperature source Oral, resp. rate (!) 22, height 4\' 10"   (1.473 m), weight 60.3 kg, SpO2 100 %.  PHYSICAL EXAMINATION:  Physical Exam  GENERAL:  53 y.o.-year-old patient lying in the bed with severe respiratory distress EYES: Pupils equal, round, reactive to light and accommodation. No scleral icterus. Extraocular muscles intact.  HEENT: Head atraumatic, normocephalic. Oropharynx and nasopharynx clear. No oropharyngeal erythema, moist oral mucosa  NECK:  Supple, no jugular venous distention. No thyroid enlargement, no tenderness.  LUNGS: Bilateral wheezing and coarse breath sounds.  Increased work of breathing CARDIOVASCULAR: S1, S2 normal. No murmurs, rubs, or gallops.  ABDOMEN: Soft, nontender, nondistended. Bowel sounds present. No organomegaly or mass.  EXTREMITIES: No pedal edema, cyanosis, or clubbing. + 2 pedal & radial pulses b/l.   NEUROLOGIC: Cranial nerves II through XII are intact. No focal Motor or sensory deficits appreciated b/l PSYCHIATRIC: The patient is alert and oriented x 3.  Anxious SKIN: No obvious rash, lesion, or ulcer.  LABORATORY PANEL:   CBC Recent Labs  Lab 09/22/18 1832  WBC 31.8*  HGB 11.1*  HCT 37.3  PLT 253   ------------------------------------------------------------------------------------------------------------------  Chemistries  Recent Labs  Lab 09/22/18 1832  NA 140  K 3.9  CL 102  CO2 30  GLUCOSE 117*  BUN 12  CREATININE 0.65  CALCIUM 8.8*   ------------------------------------------------------------------------------------------------------------------  Cardiac Enzymes Recent Labs  Lab 09/22/18 1832  TROPONINI <0.03   ------------------------------------------------------------------------------------------------------------------  RADIOLOGY:  Dg Chest 1 View  Result Date: 09/22/2018 CLINICAL DATA:  COPD with worsening dyspnea. Audible wheeze. EXAM: CHEST  1 VIEW COMPARISON:  09/13/2018 CXR, 04/25/2018 FINDINGS: Top-normal heart size. Mild aortic atherosclerosis at the arch  without aneurysmal dilatation. Evidence of prior T6 kyphoplasty with cement in place. Central vascular congestion superimposed on emphysematous lungs are identified. New ovoid opacity in the right upper lobe measuring 19 x 10 mm is noted not apparent on prior chest CT or CXR. Repeat chest CT may help for better evaluation and to exclude a pulmonary nodule. Pleural thickening or plaque, or fluid-filled bulla are some other possibilities. Biapical pleuroparenchymal scarring and thickening is redemonstrated. IMPRESSION: 1. New ovoid opacity in the right upper lobe measuring 19 x 10 mm. Repeat chest CT may help for better evaluation and to exclude a pulmonary nodule. 2. Central vascular congestion superimposed on emphysema. Electronically Signed   By: Tollie Eth M.D.   On: 09/22/2018 18:59   IMPRESSION AND PLAN:   *Acute on chronic hypoxic respiratory failure secondary to COPD exacerbation and right upper lobe pneumonia Sepsis present on admission We will treat this as healthcare acquired pneumonia. -IV steroids, Antibiotics - Scheduled Nebulizers - Inhalers -Wean O2 as tolerated - Consult pulmonary if no improvement Will need CT scan of the chest once stable to evaluate the right upper lobe possible mass  *Chronic back pain.  Pain medications as needed  *Acute on chronic diastolic congestive heart failure.  IV Lasix now.  Monitor input and output.  Repeat BMP in the morning  DVT prophylaxis with Lovenox  All the records are reviewed and case discussed with ED provider. Management plans discussed with the patient, family and they are in agreement.  CODE STATUS: Full code  TOTAL CRITICAL CARE TIME TAKING CARE OF THIS PATIENT: >75 minutes.   Molinda Bailiff Jensyn Shave M.D on 09/22/2018 at 8:18 PM  Between 7am to 6pm - Pager - [phone]  After 6pm go to [email or web address] - password [email or web address] [facility]  [facility]  Office  [phone]  CC: Primary care physician; Corky Downs, MD  Note:  This dictation was prepared with Dragon dictation along with smaller phrase technology. Any transcriptional errors that result from this process are unintentional.

## 2018-09-22 NOTE — Progress Notes (Signed)
Pharmacy Antibiotic Note  Caitlyn Evans is a 53 y.o. female admitted on 09/22/2018 with pneumonia.  Pharmacy has been consulted for vancomycin and cefepime dosing.  Plan: Vd 39L  DW 60kg T1/2 15 hours  kei 0.047  Vancomycin 1000 mg IV Q 24 hrs. Goal AUC 400-550. Expected AUC: 540 SCr used: 0.8   Height: 4\' 10"  (147.3 cm) Weight: 133 lb (60.3 kg) IBW/kg (Calculated) : 40.9  Temp (24hrs), Avg:98.7 F (37.1 C), Min:98.7 F (37.1 C), Max:98.7 F (37.1 C)  Recent Labs  Lab 09/22/18 1832 09/22/18 2039  WBC 31.8*  --   CREATININE 0.65  --   LATICACIDVEN  --  1.23    Estimated Creatinine Clearance: 61.8 mL/min (by C-G formula based on SCr of 0.65 mg/dL).    Allergies  Allergen Reactions  . Codeine Hives, Nausea And Vomiting and Nausea Only  . Penicillins Hives and Other (See Comments)    Has patient had a PCN reaction causing immediate rash, facial/tongue/throat swelling, SOB or lightheadedness with hypotension: No Has patient had a PCN reaction causing severe rash involving mucus membranes or skin necrosis: No Has patient had a PCN reaction that required hospitalization No Has patient had a PCN reaction occurring within the last 10 years: No If all of the above answers are "NO", then may proceed with Cephalosporin use. Other reaction(s): Other (See Comments) Has patient had a PCN    Antimicrobials this admission: Vanc, cefepime 1/7  >>    >>   Dose adjustments this admission:   Microbiology results: 1/7 BCx: pending 1/7 MRSA PCR: pending      1/7 CXR: RLL opacity Thank you for allowing pharmacy to be a part of this patient's care.  Keyandre Pileggi S 09/22/2018 11:51 PM

## 2018-09-23 ENCOUNTER — Inpatient Hospital Stay (HOSPITAL_COMMUNITY)
Admit: 2018-09-23 | Discharge: 2018-09-23 | Disposition: A | Discharge status: Home / Self Care | Payer: Medicare Other | Attending: Pulmonary Disease | Admitting: Pulmonary Disease

## 2018-09-23 ENCOUNTER — Other Ambulatory Visit: Payer: Self-pay

## 2018-09-23 ENCOUNTER — Inpatient Hospital Stay: Payer: Medicare Other

## 2018-09-23 DIAGNOSIS — Z7189 Other specified counseling: Secondary | ICD-10-CM

## 2018-09-23 DIAGNOSIS — Z515 Encounter for palliative care: Secondary | ICD-10-CM

## 2018-09-23 DIAGNOSIS — J189 Pneumonia, unspecified organism: Secondary | ICD-10-CM

## 2018-09-23 DIAGNOSIS — J441 Chronic obstructive pulmonary disease with (acute) exacerbation: Secondary | ICD-10-CM

## 2018-09-23 DIAGNOSIS — I509 Heart failure, unspecified: Secondary | ICD-10-CM

## 2018-09-23 DIAGNOSIS — R0602 Shortness of breath: Secondary | ICD-10-CM

## 2018-09-23 LAB — CBC
HCT: 35.4 % — ABNORMAL LOW (ref 36.0–46.0)
Hemoglobin: 10.6 g/dL — ABNORMAL LOW (ref 12.0–15.0)
MCH: 26.2 pg (ref 26.0–34.0)
MCHC: 29.9 g/dL — ABNORMAL LOW (ref 30.0–36.0)
MCV: 87.4 fL (ref 80.0–100.0)
PLATELETS: 230 10*3/uL (ref 150–400)
RBC: 4.05 MIL/uL (ref 3.87–5.11)
RDW: 17.6 % — ABNORMAL HIGH (ref 11.5–15.5)
WBC: 29.2 10*3/uL — AB (ref 4.0–10.5)
nRBC: 0 % (ref 0.0–0.2)

## 2018-09-23 LAB — BASIC METABOLIC PANEL
ANION GAP: 11 (ref 5–15)
BUN: 17 mg/dL (ref 6–20)
CO2: 30 mmol/L (ref 22–32)
Calcium: 8.7 mg/dL — ABNORMAL LOW (ref 8.9–10.3)
Chloride: 101 mmol/L (ref 98–111)
Creatinine, Ser: 0.71 mg/dL (ref 0.44–1.00)
GFR calc Af Amer: >60 mL/min (ref >60)
GFR calc non Af Amer: >60 mL/min (ref >60)
Glucose, Bld: 110 mg/dL — ABNORMAL HIGH (ref 70–99)
Potassium: 3.7 mmol/L (ref 3.5–5.1)
Sodium: 142 mmol/L (ref 135–145)

## 2018-09-23 LAB — URINALYSIS, ROUTINE W REFLEX MICROSCOPIC
Bacteria, UA: NONE SEEN
Bilirubin Urine: NEGATIVE
Glucose, UA: NEGATIVE mg/dL
KETONES UR: NEGATIVE mg/dL
Leukocytes, UA: NEGATIVE
NITRITE: NEGATIVE
Protein, ur: 30 mg/dL — AB
Specific Gravity, Urine: 1.018 (ref 1.005–1.030)
Squamous Epithelial / HPF: NONE SEEN (ref 0–5)
pH: 7 (ref 5.0–8.0)

## 2018-09-23 LAB — BRAIN NATRIURETIC PEPTIDE: B Natriuretic Peptide: 64 pg/mL (ref 0.0–100.0)

## 2018-09-23 LAB — GLUCOSE, CAPILLARY: Glucose-Capillary: 137 mg/dL — ABNORMAL HIGH (ref 70–99)

## 2018-09-23 LAB — PROCALCITONIN: Procalcitonin: 0.3 ng/mL

## 2018-09-23 LAB — ECHOCARDIOGRAM COMPLETE
Height: 58 in
WEIGHTICAEL: 2095.25 [oz_av]

## 2018-09-23 LAB — STREP PNEUMONIAE URINARY ANTIGEN: Strep Pneumo Urinary Antigen: NEGATIVE

## 2018-09-23 LAB — MRSA PCR SCREENING: MRSA by PCR: NEGATIVE

## 2018-09-23 MED ORDER — MORPHINE SULFATE (PF) 2 MG/ML IV SOLN
1.0000 mg | INTRAVENOUS | Status: DC | PRN
  Administered 2018-09-23 – 2018-09-24 (×6): 2 mg via INTRAVENOUS
  Filled 2018-09-23 (×7): qty 1

## 2018-09-23 MED ORDER — TAMSULOSIN HCL 0.4 MG PO CAPS
0.4000 mg | ORAL_CAPSULE | Freq: Every day | ORAL | Status: DC
  Administered 2018-09-23 – 2018-09-26 (×4): 0.4 mg via ORAL
  Filled 2018-09-23 (×4): qty 1

## 2018-09-23 MED ORDER — DONEPEZIL HCL 5 MG PO TABS
10.0000 mg | ORAL_TABLET | Freq: Every day | ORAL | Status: DC
  Administered 2018-09-23 – 2018-09-26 (×4): 10 mg via ORAL
  Filled 2018-09-23 (×5): qty 2

## 2018-09-23 MED ORDER — PANTOPRAZOLE SODIUM 40 MG PO TBEC
40.0000 mg | DELAYED_RELEASE_TABLET | Freq: Two times a day (BID) | ORAL | Status: DC
  Administered 2018-09-23 – 2018-09-26 (×7): 40 mg via ORAL
  Filled 2018-09-23 (×7): qty 1

## 2018-09-23 MED ORDER — ALBUTEROL SULFATE (2.5 MG/3ML) 0.083% IN NEBU
10.0000 mg/h | INHALATION_SOLUTION | RESPIRATORY_TRACT | Status: DC
  Administered 2018-09-23 – 2018-09-24 (×2): 10 mg/h via RESPIRATORY_TRACT
  Filled 2018-09-23: qty 12
  Filled 2018-09-23: qty 3

## 2018-09-23 MED ORDER — ORAL CARE MOUTH RINSE
15.0000 mL | Freq: Two times a day (BID) | OROMUCOSAL | Status: DC
  Administered 2018-09-24: 15 mL via OROMUCOSAL

## 2018-09-23 MED ORDER — BUDESONIDE 0.25 MG/2ML IN SUSP
0.2500 mg | Freq: Two times a day (BID) | RESPIRATORY_TRACT | Status: DC
  Administered 2018-09-23 – 2018-09-26 (×8): 0.25 mg via RESPIRATORY_TRACT
  Filled 2018-09-23 (×8): qty 2

## 2018-09-23 MED ORDER — FUROSEMIDE 20 MG PO TABS: 20.0000 mg | ORAL_TABLET | Freq: Every day | ORAL | Status: DC

## 2018-09-23 MED ORDER — FUROSEMIDE 10 MG/ML IJ SOLN
40.0000 mg | Freq: Every day | INTRAMUSCULAR | Status: DC
  Administered 2018-09-23 – 2018-09-25 (×3): 40 mg via INTRAVENOUS
  Filled 2018-09-23 (×3): qty 4

## 2018-09-23 MED ORDER — METHYLPREDNISOLONE SODIUM SUCC 125 MG IJ SOLR
60.0000 mg | Freq: Four times a day (QID) | INTRAMUSCULAR | Status: DC
  Administered 2018-09-23 – 2018-09-24 (×6): 60 mg via INTRAVENOUS
  Filled 2018-09-23 (×6): qty 2

## 2018-09-23 MED ORDER — IPRATROPIUM-ALBUTEROL 0.5-2.5 (3) MG/3ML IN SOLN
3.0000 mL | RESPIRATORY_TRACT | Status: DC
  Administered 2018-09-23 – 2018-09-25 (×18): 3 mL via RESPIRATORY_TRACT
  Filled 2018-09-23 (×16): qty 3

## 2018-09-23 MED ORDER — GUAIFENESIN 100 MG/5ML PO SOLN
5.0000 mL | ORAL | Status: DC | PRN
  Administered 2018-09-23 – 2018-09-25 (×6): 100 mg via ORAL
  Filled 2018-09-23 (×7): qty 5

## 2018-09-23 MED ORDER — METOPROLOL TARTRATE 25 MG PO TABS
25.0000 mg | ORAL_TABLET | Freq: Two times a day (BID) | ORAL | Status: DC
  Administered 2018-09-23 – 2018-09-26 (×6): 25 mg via ORAL
  Filled 2018-09-23 (×7): qty 1

## 2018-09-23 MED ORDER — DILTIAZEM HCL ER COATED BEADS 180 MG PO CP24
180.0000 mg | ORAL_CAPSULE | Freq: Every day | ORAL | Status: DC
  Administered 2018-09-23 – 2018-09-26 (×4): 180 mg via ORAL
  Filled 2018-09-23 (×4): qty 1

## 2018-09-23 MED ORDER — CLINDAMYCIN PHOSPHATE 600 MG/50ML IV SOLN
600.0000 mg | Freq: Three times a day (TID) | INTRAVENOUS | Status: DC
  Administered 2018-09-23 – 2018-09-26 (×9): 600 mg via INTRAVENOUS
  Filled 2018-09-23 (×11): qty 50

## 2018-09-23 MED ORDER — ROSUVASTATIN CALCIUM 10 MG PO TABS
10.0000 mg | ORAL_TABLET | Freq: Every day | ORAL | Status: DC
  Administered 2018-09-23 – 2018-09-26 (×4): 10 mg via ORAL
  Filled 2018-09-23 (×4): qty 1

## 2018-09-23 MED ORDER — IPRATROPIUM-ALBUTEROL 0.5-2.5 (3) MG/3ML IN SOLN
3.0000 mL | RESPIRATORY_TRACT | Status: DC | PRN
  Administered 2018-09-24 – 2018-09-26 (×2): 3 mL via RESPIRATORY_TRACT
  Filled 2018-09-23 (×3): qty 3

## 2018-09-23 NOTE — Progress Notes (Signed)
[facility] - [place] at [facility]   PATIENT NAME: Caitlyn Evans    MR#:  [id number]  DATE OF BIRTH:  07-15-1966  SUBJECTIVE:  CHIEF COMPLAINT:   Chief Complaint  Patient presents with  . Shortness of Breath    Patient reported having had a fall at home.  Nursing staff confirmed patient which recorded multiple admissions.  Also notified by nursing staff about consult for palliative care.  No fevers. Patient with multiple joint pains which is chronic.  REVIEW OF SYSTEMS:  Review of Systems  Constitutional: Negative for chills and fever.  HENT: Negative for hearing loss and tinnitus.   Eyes: Negative for blurred vision and double vision.  Respiratory: Positive for cough and shortness of breath.   Cardiovascular: Negative for chest pain and palpitations.  Gastrointestinal: Negative for heartburn, nausea and vomiting.  Genitourinary: Negative for dysuria and urgency.  Musculoskeletal: Positive for falls.       Multiple nonspecific joint pains which is chronic.  Skin: Negative for itching and rash.  Neurological: Negative for dizziness and headaches.  Psychiatric/Behavioral: Negative for depression and hallucinations.    DRUG ALLERGIES:   Allergies  Allergen Reactions  . Codeine Hives, Nausea And Vomiting and Nausea Only  . Penicillins Hives and Other (See Comments)    Has patient had a PCN reaction causing immediate rash, facial/tongue/throat swelling, SOB or lightheadedness with hypotension: No Has patient had a PCN reaction causing severe rash involving mucus membranes or skin necrosis: No Has patient had a PCN reaction that required hospitalization No Has patient had a PCN reaction occurring within the last 10 years: No If all of the above answers are "NO", then may proceed with Cephalosporin use. Other reaction(s): Other (See Comments) Has patient had a PCN   VITALS:  Blood pressure (!) 106/59, pulse 65, temperature 98 F (36.7 C), temperature  source Oral, resp. rate 15, height 4\' 10"  (1.473 m), weight 59.4 kg, SpO2 99 %. PHYSICAL EXAMINATION:   Physical Exam  Constitutional: She is oriented to person, place, and time. She appears well-developed and well-nourished.  HENT:  Head: Normocephalic and atraumatic.  Eyes: Pupils are equal, round, and reactive to light. Conjunctivae and EOM are normal.  Neck: Normal range of motion. Neck supple.  Cardiovascular: Normal rate, regular rhythm and normal heart sounds.  Respiratory: Effort normal and breath sounds normal.  GI: Soft. Bowel sounds are normal. She exhibits no distension.  Musculoskeletal: Normal range of motion.        General: No edema.  Neurological: She is alert and oriented to person, place, and time.  Skin: Skin is warm.  Psychiatric: She has a normal mood and affect. Her behavior is normal.   LABORATORY PANEL:  Female CBC Recent Labs  Lab 09/23/18 0142  WBC 29.2*  HGB 10.6*  HCT 35.4*  PLT 230   ------------------------------------------------------------------------------------------------------------------ Chemistries  Recent Labs  Lab 09/23/18 0142  NA 142  K 3.7  CL 101  CO2 30  GLUCOSE 110*  BUN 17  CREATININE 0.71  CALCIUM 8.7*   RADIOLOGY:  Dg Chest 1 View  Result Date: 09/22/2018 CLINICAL DATA:  COPD with worsening dyspnea. Audible wheeze. EXAM: CHEST  1 VIEW COMPARISON:  09/13/2018 CXR, 04/25/2018 FINDINGS: Top-normal heart size. Mild aortic atherosclerosis at the arch without aneurysmal dilatation. Evidence of prior T6 kyphoplasty with cement in place. Central vascular congestion superimposed on emphysematous lungs are identified. New ovoid opacity in the right upper lobe measuring 19 x 10 mm is noted  not apparent on prior chest CT or CXR. Repeat chest CT may help for better evaluation and to exclude a pulmonary nodule. Pleural thickening or plaque, or fluid-filled bulla are some other possibilities. Biapical pleuroparenchymal scarring and  thickening is redemonstrated. IMPRESSION: 1. New ovoid opacity in the right upper lobe measuring 19 x 10 mm. Repeat chest CT may help for better evaluation and to exclude a pulmonary nodule. 2. Central vascular congestion superimposed on emphysema. Electronically Signed   By: Tollie Eth M.D.   On: 09/22/2018 18:59   Ct Chest Wo Contrast  Result Date: 09/23/2018 CLINICAL DATA:  Acute resp illness, > 89 years old. Increasing shortness of breath. Recent kyphoplasty EXAM: CT CHEST WITHOUT CONTRAST TECHNIQUE: Multidetector CT imaging of the chest was performed following the standard protocol without IV contrast. COMPARISON:  Radiographs yesterday. Chest CT 04/25/2018 FINDINGS: Cardiovascular: The heart is normal in size. Thoracic aorta is normal in caliber with mild atherosclerosis. No pericardial effusion. Mediastinum/Nodes: Small mediastinal lymph nodes not enlarged by size criteria. Slight right hilar prominence, limited assessment for adenopathy given lack of IV contrast. The esophagus is decompressed. Lungs/Pleura: Multifocal nodular and ground-glass opacities throughout the right upper, lower, and middle lobes, greatest in the perihilar right middle lobe. Majority of these opacities are 1-2 cm. Right greater than left bronchial thickening, with scattered areas of mucous plugging. Chain suture again seen in the left upper lobe. No acute left lung airspace disease. Mild to moderate emphysema. No evidence pulmonary edema. No pleural effusion. Upper Abdomen: Small hiatal hernia. No acute findings. Musculoskeletal: Kyphoplasty material within T6 vertebral body. No evidence of extravasated cement beyond the vertebral body. Mild T12 compression fracture as seen on prior imaging. Remote sternal fracture. IMPRESSION: 1. Multifocal nodular and ground-glass opacities throughout the right lung, greatest in the perihilar right middle lobe. Findings her most consistent with multifocal pneumonia. Recommend continued  radiographic follow-up. 2. Mild to moderate emphysema and bronchial thickening. Aortic Atherosclerosis (ICD10-I70.0) and Emphysema (ICD10-J43.9). Electronically Signed   By: Narda Rutherford M.D.   On: 09/23/2018 01:43   ASSESSMENT AND PLAN:   1. Acute on chronic hypoxic respiratory failure secondary to COPD exacerbation and multifocal right-sided pneumonia confirmed on CT chest Patient being managed by critical care team with IV antibiotics, steroids with IV Solu-Medrol.  Nebulizer treatments. Supplemental oxygen. 2D echocardiogram noted to have been done today.  Awaiting report. Patient reported to have multiple recurrent admissions with similar complaints. Palliative consult already placed by ICU team.  Follow-up on recommendations. Patient disease 3 L of oxygen at home.  Already weaned off BiPAP prior to my evaluation.  Sepsis secondary to pneumonia.  Present on admission. IV antibiotics with pharmacy assisting with dosing.  IV fluids previously discontinued due to underlying CHF.  Currently hemodynamically stable.  2. Chronic back pain.  Pain medications as needed  3. Acute on chronic diastolic congestive heart failure.  IV Lasix now.  Monitor input and output.  Repeat BMP in the morning Follow-up on 2D echocardiogram report when grade  DVT prophylaxis with Lovenox  Management plans discussed with the patient, family and they are in agreement.  CODE STATUS: Full Code  TOTAL TIME TAKING CARE OF THIS PATIENT: 37 minutes.   More than 50% of the time was spent in counseling/coordination of care: YES  POSSIBLE D/C IN 2 DAYS, DEPENDING ON CLINICAL CONDITION.   Basilia Stuckert M.D on 09/23/2018 at 4:08 PM  Between 7am to 6pm - Pager - [phone]  After 6pm go to [email or web address] - password  [email or web address]  [facility] [place] [facility]  Office  [phone]  CC: Primary care physician; Corky DownsMasoud, Javed, MD  Note: This dictation was prepared with Dragon dictation along with  smaller phrase technology. Any transcriptional errors that result from this process are unintentional.

## 2018-09-23 NOTE — Progress Notes (Signed)
*  PRELIMINARY RESULTS* Echocardiogram 2D Echocardiogram has been performed.  Joanette Gula Kaytie Ratcliffe 09/23/2018, 1:59 PM

## 2018-09-23 NOTE — ED Notes (Signed)
Spoke with house supervisor concerning pt status and that pt was unable to be weaned off of bipap and required an ICU bed.

## 2018-09-23 NOTE — Progress Notes (Signed)
Pt transported to CT then to ICU 9 on BIPAP without incident. Pt remains on BIPAP and is tol well at this time. Report given to ICU RT.

## 2018-09-23 NOTE — Consult Note (Signed)
Consultation Note Date: 09/23/2018   Patient Name: Caitlyn Evans  DOB: 1966-08-31  MRN: [id number]  Age / Sex: 53 y.o., female  PCP: Caitlyn Athens, MD Referring Physician: Otila Back, MD  Reason for Consultation: Establishing goals of care  HPI/Patient Profile: 53 y.o. female admitted on 09/22/2018 from home with complaints of shortness of breath. She has a past medical history significant for COPD, BOOP, MI, hypertension, anxiety, CHF, MI, and chronic Evans pain (kyphoplasty). During her ED course she appeared tachypneic and wheezing. She was given multiple breathing treatments and IV steroids with minimum response. She was initiated on BiPAP for respiratory support. Chest x-ray showed right upper lobe mass versus infiltrate, and chronic changes. WBC 31.8, hgb 11.1, BNP 68.0, lactic acid 1.23. Since admission she has been weaned to nasal cannula and tolerating. Complains of shortness of breath with exertion. She is receiving IV Solu-Medrol, Vancomycin, and Cefepime post cultures. Palliative Medicine consulted for goals of care discussion.   Clinical Assessment and Goals of Care: I have reviewed medical records including lab results, imaging, Epic notes, and MAR, received report from the bedside RN, and assessed the patient. I met at the bedside with patient and her granddaughter, Caitlyn Evans to discuss diagnosis prognosis, GOC, EOL wishes, disposition and options. Patient is A&O x3. She was able to appropriately engage in goals of care discussion.   I introduced Palliative Medicine as specialized medical care for people living with serious illness. It focuses on providing relief from the symptoms and stress of a serious illness. The goal is to improve quality of life for both the patient and the family.  Patient reports she has been married to her husband for 28 years. Their granddaughter also lives in the home with  them. They have 3 dogs which she loves dearly. She has 2 children.   Patient reports a decline in health over the past 6 months. She reports generalized weakness, fatigue, and shortness of breath with minimal exertion. She endorses home oxygen 3L/[place]. She recently began wearing constantly as before it was as needed. She is ambulatory without assistance and able to perform ADLs independently however requires frequent rest breaks due to her breathing and fatigue. She reports she has frequent migraines due to lack of oxygen and chronic Evans pain. She states her appetite is up and down just depends on her steroids and how she is feeling.  We discussed her current illness and what it means in the larger context of her on-going co-morbidities.  Natural disease trajectory and expectations at EOL were discussed. Patient became tearful and verbalized her awareness of her current illness and co-morbidities.   She expressed her mother and sister both passed away with similar complications of COPD. She remains somewhat hopeful that she will continue on but also expresses she is prepared for the worst.   I attempted to elicit values and goals of care important to the patient.    The difference between aggressive medical intervention and comfort care was considered in light of the patient's goals of  care.  I discussed with patient her current FULL CODE status. She is tearful and expressed "I am tired of living like this and I know it wont get much better. I am ready to go when God calls me home but I can't give up just yet, because my husband and granddaughter won't let me!" Support given. I discussed in detail what a potential code or emergent event would look like with consideration to her co-morbidities. She verbalized understanding and expressed she has been intubated 7 times. She jokingly said "I guess for my husband's sake maybe God is giving me 9 lives and I have 1-2 more to go unless he is counting this one"! I  encouraged patient to allow myself and her to discuss her feelings with her husband regarding her wishes, however, she stated she has discussed with him before and it doesn't go well with him and he continues to express she has to put up a fight. She is tearful and states "he knows if she is in vegetative state, kidney failure, have another heart attack, or the doctors look at him and tell him I am not going to make it after working on me to let me go no matter what. He said ok he would carry out those wishes!"    Patient states she has a documented advance directive and her husband and mother-in-law Caitlyn Evans are her HCPOA.   Hospice and Palliative Care services outpatient were explained and offered. Given patient's wishes to continue with aggressive measures and potentially seek rehab, recommendations would be for outpatient palliative. Patient and family verbalized their understanding and awareness of palliative's goals and philosophy of care. She expressed she is familiar with Palliative and Hospice as both her mom and sister had their services and she thinks highly of them. She request outpatient palliative at discharge, with awareness she can transition to hospice at any point by communicating with her care team.   Questions and concerns were addressed. Patient was encouraged to call with questions or concerns.  PMT will continue to support holistically.  Primary Decision Maker.  PATIENT    SUMMARY OF RECOMMENDATIONS    FULL CODE-patient tearful and states she is ready when God calls but her husband wants aggressive measures, which she supports his wishes.   Continue to treat the treatable. Including aggressive measures.   Patient/family request outpatient palliative at discharge. Expresses wishes if PT needed preference is home but would consider SNF if absolutely necessary.   Case Management referral for outpatient palliative.   Palliative Medicine team will continue to support and  follow.   Code Status/Advance Care Planning:  Full code   Symptom Management:   Per attending   Palliative Prophylaxis:   Bowel Regimen and Frequent Pain Assessment  Additional Recommendations (Limitations, Scope, Preferences):  Full Scope Treatment  Psycho-social/Spiritual:   Desire for further Chaplaincy support:NO   Additional Recommendations: Education on Hospice  Prognosis:   Guarded to Poor in the setting of COPD exacerbation, acute hypoxic respiratory failure requiring BiPAP, HCAP, acute on chronic CHF, asthma, BOOP, hypertension, decreased mobility, and MI.   Discharge Planning: To Be Determined with outpatient Palliative.       Primary Diagnoses: Present on Admission: . COPD exacerbation ([place])   I have reviewed the medical record, interviewed the patient and family, and examined the patient. The following aspects are pertinent.  Past Medical History:  Diagnosis Date  . Anxiety    panic attacks  . Asthma   . BOOP (bronchiolitis  obliterans with organizing pneumonia) ([place])   . CHF (congestive heart failure) ([place])   . Dysphagia, pharyngoesophageal phase 05/30/2015  . Emphysema lung ([place]) 05/30/2015   on steroids and nebulizer for copd issues.  09/15/18  . GERD (gastroesophageal reflux disease)   . Hypercholesterolemia   . Hypertension   . Lung mass   . Migraines   . Motion sickness    all moving vehicles  . Myocardial infarction ([place]) 2012  . Respiratory failure with hypercapnia ([place]) 08/2018   was hospitalized and on bipap. steroids and nebulizer treatments helped  . Seasonal allergies   . Shortness of breath dyspnea    1 flight-stairs  . Sternal fracture    Social History   Socioeconomic History  . Marital status: Married    Spouse name: thomas  . Number of children: Not on file  . Years of education: Not on file  . Highest education level: Not on file  Occupational History    Comment: disabled  Social Needs  . Financial resource  strain: Not on file  . Food insecurity:    Worry: Not on file    Inability: Not on file  . Transportation needs:    Medical: Not on file    Non-medical: Not on file  Tobacco Use  . Smoking status: Former Smoker    Packs/day: 1.00    Years: 30.00    Pack years: 30.00    Types: Cigarettes  . Smokeless tobacco: Never Used  . Tobacco comment: quit some time in 2012  Substance and Sexual Activity  . Alcohol use: No    Alcohol/week: 0.0 standard drinks    Comment: rare consumption  . Drug use: No  . Sexual activity: Not Currently    Partners: Male  Lifestyle  . Physical activity:    Days per week: Not on file    Minutes per session: Not on file  . Stress: Not on file  Relationships  . Social connections:    Talks on phone: Not on file    Gets together: Not on file    Attends religious service: Not on file    Active member of club or organization: Not on file    Attends meetings of clubs or organizations: Not on file    Relationship status: Not on file  Other Topics Concern  . Not on file  Social History Narrative  . Not on file   Family History  Problem Relation Age of Onset  . COPD Mother   . Heart attack Mother   . Stroke Father   . COPD Father    Scheduled Meds: . budesonide (PULMICORT) nebulizer solution  0.25 mg Nebulization BID  . diltiazem  180 mg Oral Daily  . donepezil  10 mg Oral Daily  . enoxaparin (LOVENOX) injection  40 mg Subcutaneous Q24H  . furosemide  40 mg Intravenous Daily  . ipratropium-albuterol  3 mL Nebulization Q4H  . methylPREDNISolone (SOLU-MEDROL) injection  60 mg Intravenous Q6H  . metoprolol tartrate  25 mg Oral BID  . pantoprazole  40 mg Oral BID  . rosuvastatin  10 mg Oral Daily  . sodium chloride flush  3 mL Intravenous Q12H  . tamsulosin  0.4 mg Oral Daily   Continuous Infusions: . albuterol 10 mg/hr (09/23/18 0148)  . clindamycin (CLEOCIN) IV     PRN Meds:.acetaminophen **OR** acetaminophen, ibuprofen, ipratropium-albuterol,  LORazepam, morphine injection, ondansetron **OR** ondansetron (ZOFRAN) IV, oxyCODONE-acetaminophen, polyethylene glycol Medications Prior to Admission:  Prior to Admission medications  Medication Sig Start Date End Date Taking? Authorizing Provider  ALPRAZolam Duanne Moron) 0.5 MG tablet Take 0.5 mg by mouth 3 (three) times daily.    Yes [provider]  diltiazem (CARDIZEM CD) 180 MG 24 hr capsule Take 1 capsule (180 mg total) by mouth daily. 11/26/17  Yes Gladstone Lighter, MD  donepezil (ARICEPT) 10 MG tablet Take 10 mg by mouth daily.    Yes [provider]  furosemide (LASIX) 20 MG tablet Take 1 tablet (20 mg total) by mouth daily. 11/26/17  Yes Gladstone Lighter, MD  ipratropium-albuterol (DUONEB) 0.5-2.5 (3) MG/3ML SOLN Inhale 3 mLs into the lungs every 4 (four) hours as needed (for wheezing/shortness of breath). Patient taking differently: Inhale 3 mLs into the lungs every 4 (four) hours as needed. For wheezing/shortness of breath 05/13/16  Yes Gladstone Lighter, MD  metoprolol tartrate (LOPRESSOR) 25 MG tablet Take 1 tablet (25 mg total) by mouth 2 (two) times daily. 11/25/17  Yes Gladstone Lighter, MD  oxyCODONE-acetaminophen (PERCOCET) 5-325 MG tablet Take 1 tablet by mouth every 6 (six) hours as needed for severe pain. 09/17/18 09/17/19 Yes Hessie Knows, MD  pantoprazole (PROTONIX) 40 MG tablet TAKE ONE TABLET BY MOUTH TWICE DAILY Patient taking differently: Take 40 mg by mouth 2 (two) times daily.  04/03/18  Yes Vonda Antigua B, MD  rosuvastatin (CRESTOR) 10 MG tablet Take 10 mg by mouth daily.  10/10/17  Yes [provider]  tamsulosin (FLOMAX) 0.4 MG CAPS capsule Take 1 capsule (0.4 mg total) by mouth daily. 11/26/17  Yes Gladstone Lighter, MD  predniSONE (DELTASONE) 10 MG tablet Take 1 tablet (10 mg total) by mouth daily. Label  & dispense according to the schedule below.  6 tablets day one, then 5 table day 2, then 4 tablets day 3, then 3 tablets day 4, 2  tablets day 5, then 1 tablet day 6, then stop 09/15/18   Saundra Shelling, MD   Allergies  Allergen Reactions  . Codeine Hives, Nausea And Vomiting and Nausea Only  . Penicillins Hives and Other (See Comments)    Has patient had a PCN reaction causing immediate rash, facial/tongue/throat swelling, SOB or lightheadedness with hypotension: No Has patient had a PCN reaction causing severe rash involving mucus membranes or skin necrosis: No Has patient had a PCN reaction that required hospitalization No Has patient had a PCN reaction occurring within the last 10 years: No If all of the above answers are "NO", then may proceed with Cephalosporin use. Other reaction(s): Other (See Comments) Has patient had a PCN   Review of Systems  Constitutional: Positive for activity change, appetite change and fatigue.  Respiratory: Positive for shortness of breath and wheezing.   Musculoskeletal: Positive for arthralgias.  Neurological: Positive for weakness.  All other systems reviewed and are negative.   Physical Exam Vitals signs and nursing note reviewed.  Constitutional:      General: She is awake.     Appearance: She is ill-appearing.     Comments: Thin, frail, chronically ill   Cardiovascular:     Rate and Rhythm: Normal rate.     Pulses: Decreased pulses.     Heart sounds: Normal heart sounds.  Pulmonary:     Breath sounds: Decreased breath sounds and wheezing present.     Comments: 3L/[place], shortness of breath when talking  Abdominal:     General: Abdomen is flat. Bowel sounds are normal.     Palpations: Abdomen is soft.  Skin:    General: Skin  is warm and dry.     Findings: Bruising and ecchymosis present.     Comments: S/p fall with bilateral lower extremity bruising, upper chest scattered bruising, scatter bilateral upper extremity bruising.   Neurological:     Mental Status: She is alert and oriented to person, place, and time.     Motor: Weakness present.  Psychiatric:         Attention and Perception: Attention normal.        Mood and Affect: Mood normal.        Speech: Speech normal.        Behavior: Behavior is cooperative.        Thought Content: Thought content normal.        Cognition and Memory: Cognition normal.        Judgment: Judgment normal.     Vital Signs: BP (!) 106/59   Pulse 65   Temp 98 F (36.7 C) (Oral)   Resp 15   Ht '4\' 10"'  (1.473 m)   Wt 59.4 kg   SpO2 99%   BMI 27.37 kg/m  Pain Scale: 0-10 POSS *See Group Information*: 1-Acceptable,Awake and alert Pain Score: 7    SpO2: SpO2: 99 % O2 Device:SpO2: 99 % O2 Flow Rate: .O2 Flow Rate (L/min): 3 L/min  IO: Intake/output summary:   Intake/Output Summary (Last 24 hours) at 09/23/2018 1546 Last data filed at 09/23/2018 1237 Gross per 24 hour  Intake 400 ml  Output 1750 ml  Net -1350 ml    LBM: Last BM Date: 09/21/18 Baseline Weight: Weight: 60.3 kg Most recent weight: Weight: 59.4 kg     Palliative Assessment/Data: PPS 30%   Time In: 1315 Time Out: 1430 Time Total: 75 min  Greater than 50%  of this time was spent counseling and coordinating care related to the above assessment and plan.  Signed by:  Alda Lea, AGPCNP-BC Palliative Medicine Team  Phone: [phone] Fax: [phone] Pager: [phone] Amion: Bjorn Pippin    Please contact Palliative Medicine Team phone at [phone] for questions and concerns.  For individual provider: See Shea Evans

## 2018-09-23 NOTE — Progress Notes (Signed)
Patient states she does not want to go on Bipap. Patient is resting comfortably on 3L [place] Sat at 98%. Normal respirations. Some expiratory wheezes. Patient informed Bipap would stay on standby in room, if increased work of breathing or SATS drop patient will be placed back on Bipap. Patient understands but wishes to stay on [place].

## 2018-09-23 NOTE — Consult Note (Signed)
Name: Caitlyn Evans MRN: [id number] DOB: May 04, 1966    ADMISSION DATE:  09/22/2018 CONSULTATION DATE:  09/23/18  REFERRING MD :  Dr. Elpidio AnisSudini  CHIEF COMPLAINT:  Shortness of Breath  BRIEF PATIENT DESCRIPTION:  53 y.o Female admitted with Acute Hypoxic Respiratory Failure requiring BiPAP in setting of COPD Exacerbation and HCAP.  Pt also with Acute on Chronic CHF.  SIGNIFICANT EVENTS  09/23/18>> Admission to [facility]  STUDIES:  CT Chest wo Contrast 09/23/18>>  CULTURES: Blood x2 09/22/18>> Sputum 09/23/18>> Strep pneumo urinary antigen 09/23/18>> Legionella urinary antigen 09/23/18>>  ANTIBIOTICS: Cefepime 09/22/18>> Vancomycin 09/22/18>>  HISTORY OF PRESENT ILLNESS:   Caitlyn Evans is a 53 y.o. Female with a PMH of COPD, Boop, Chronic back pain who [facility]18 with c/o shortness of breath and wheezing.  She reports intermittent cough productive of small amounts of greenish sputum.  She reports her symptoms began approximately 2 days ago.  She denies chest pain, fever or chills, or hemoptysis.  She also reports that she underwent a recent kyphoplasty at T6 this past Thursday 09/17/18.  She denies smoking, but it is documented in ED notes that her family reports she continues to smoke.  Due to her work of breathing, she was placed on BiPAP in the ED.  Initial workup in the ED revealed WBC 31.8, BNP 68, and lactic acid 1.23.  CXR with opacity in RUL concerning for mass vs. Infiltrate, as well as pulmonary congestion superimposed on emphysema.  [facility] for treatment of Acute Hypoxic Respiratory Failure requiring BiPAP in the setting of acute COPD exacerbation and HCAP.  PAST MEDICAL HISTORY :   has a past medical history of Anxiety, Asthma, BOOP (bronchiolitis obliterans with organizing pneumonia) (HCC), CHF (congestive heart failure) (HCC), Dysphagia, pharyngoesophageal phase (05/30/2015), Emphysema lung (HCC) (05/30/2015), GERD (gastroesophageal reflux  disease), Hypercholesterolemia, Hypertension, Lung mass, Migraines, Motion sickness, Myocardial infarction (HCC) (2012), Respiratory failure with hypercapnia (HCC) (08/2018), Seasonal allergies, Shortness of breath dyspnea, and Sternal fracture.  has a past surgical history that includes Vaginal hysterectomy; Lung surgery (Left); Oophorectomy (Left); Colonoscopy with propofol (N/A, 06/02/2015); Esophagogastroduodenoscopy (egd) with propofol (N/A, 06/02/2015); Esophagogastroduodenoscopy (egd) with propofol (N/A, 11/05/2017); and Kyphoplasty (N/A, 09/17/2018). Prior to Admission medications   Medication Sig Start Date End Date Taking? Authorizing Provider  ALPRAZolam Prudy Feeler(XANAX) 0.5 MG tablet Take 0.5 mg by mouth 3 (three) times daily.    Yes [provider]  diltiazem (CARDIZEM CD) 180 MG 24 hr capsule Take 1 capsule (180 mg total) by mouth daily. 11/26/17  Yes Enid BaasKalisetti, Radhika, MD  donepezil (ARICEPT) 10 MG tablet Take 10 mg by mouth daily.    Yes [provider]  furosemide (LASIX) 20 MG tablet Take 1 tablet (20 mg total) by mouth daily. 11/26/17  Yes Enid BaasKalisetti, Radhika, MD  ipratropium-albuterol (DUONEB) 0.5-2.5 (3) MG/3ML SOLN Inhale 3 mLs into the lungs every 4 (four) hours as needed (for wheezing/shortness of breath). Patient taking differently: Inhale 3 mLs into the lungs every 4 (four) hours as needed. For wheezing/shortness of breath 05/13/16  Yes Enid BaasKalisetti, Radhika, MD  metoprolol tartrate (LOPRESSOR) 25 MG tablet Take 1 tablet (25 mg total) by mouth 2 (two) times daily. 11/25/17  Yes Enid BaasKalisetti, Radhika, MD  oxyCODONE-acetaminophen (PERCOCET) 5-325 MG tablet Take 1 tablet by mouth every 6 (six) hours as needed for severe pain. 09/17/18 09/17/19 Yes Kennedy BuckerMenz, Michael, MD  pantoprazole (PROTONIX) 40 MG tablet TAKE ONE TABLET BY MOUTH TWICE DAILY Patient taking differently:  Take 40 mg by mouth 2 (two) times daily.  04/03/18  Yes Melodie Bouillonahiliani, Varnita B, MD  rosuvastatin (CRESTOR) 10 MG tablet Take 10  mg by mouth daily.  10/10/17  Yes [provider]  tamsulosin (FLOMAX) 0.4 MG CAPS capsule Take 1 capsule (0.4 mg total) by mouth daily. 11/26/17  Yes Enid BaasKalisetti, Radhika, MD  predniSONE (DELTASONE) 10 MG tablet Take 1 tablet (10 mg total) by mouth daily. Label  & dispense according to the schedule below.  6 tablets day one, then 5 table day 2, then 4 tablets day 3, then 3 tablets day 4, 2 tablets day 5, then 1 tablet day 6, then stop 09/15/18   Ihor AustinPyreddy, Pavan, MD   Allergies  Allergen Reactions  . Codeine Hives, Nausea And Vomiting and Nausea Only  . Penicillins Hives and Other (See Comments)    Has patient had a PCN reaction causing immediate rash, facial/tongue/throat swelling, SOB or lightheadedness with hypotension: No Has patient had a PCN reaction causing severe rash involving mucus membranes or skin necrosis: No Has patient had a PCN reaction that required hospitalization No Has patient had a PCN reaction occurring within the last 10 years: No If all of the above answers are "NO", then may proceed with Cephalosporin use. Other reaction(s): Other (See Comments) Has patient had a PCN    FAMILY HISTORY:  family history includes COPD in her father and mother; Heart attack in her mother; Stroke in her father. SOCIAL HISTORY:  reports that she has quit smoking. Her smoking use included cigarettes. She has a 30.00 pack-year smoking history. She has never used smokeless tobacco. She reports that she does not drink alcohol or use drugs.  REVIEW OF SYSTEMS:  Positives in BOLD Constitutional: Negative for fever, chills, weight loss, malaise/fatigue and diaphoresis.  HENT: Negative for hearing loss, ear pain, nosebleeds, congestion, sore throat, neck pain, tinnitus and ear discharge.   Eyes: Negative for blurred vision, double vision, photophobia, pain, discharge and redness.  Respiratory: Negative for +cough, hemoptysis, sputum production, +shortness of breath, +wheezing and stridor.     Cardiovascular: Negative for chest pain, palpitations, orthopnea, claudication, leg swelling and PND.  Gastrointestinal: Negative for heartburn, nausea, vomiting, abdominal pain, diarrhea, constipation, blood in stool and melena.  Genitourinary: Negative for dysuria, urgency, frequency, hematuria and flank pain.  Musculoskeletal: Negative for myalgias, back pain, joint pain and falls.  Skin: Negative for itching and rash.  Neurological: Negative for dizziness, tingling, tremors, sensory change, speech change, focal weakness, seizures, loss of consciousness, weakness and headaches.  Endo/Heme/Allergies: Negative for environmental allergies and polydipsia. Does not bruise/bleed easily.  SUBJECTIVE:  She reports shortness of breath, but it has improved since being placed on BiPAP Reports wheezing, intermittent cough productive of greenish sputum Denies chest pain, fever, or chills  VITAL SIGNS: Temp:  [98.7 F (37.1 C)] 98.7 F (37.1 C) (01/07 1814) Pulse Rate:  [62-92] 78 (01/08 0015) Resp:  [13-30] 27 (01/08 0015) BP: (112-157)/(55-98) 154/66 (01/08 0000) SpO2:  [98 %-100 %] 100 % (01/08 0015) Weight:  [60.3 kg] 60.3 kg (01/07 1829)  PHYSICAL EXAMINATION: General:  Acute on chronically ill appearing female, sitting in bed, on BiPAP, with mild respiratory distress Neuro:  Awake, A&O x4, follows commands, no focal deficits, speech clear HEENT:  Atraumatic, normocephalic, neck supple, no JVD Cardiovascular:  RRR, s1s2, no M/R/G, 2+ pulses throughout Lungs:  Expiratory wheezing throughout, even, mild assessory muscle use and tachypnea, BiPAP assisted Abdomen:  Obese, soft, nontender, nondistended, no guarding or rebound  tenderness, BS+ x4 Musculoskeletal:  Normal bulk and tone, no deformities, no edema Skin: Warm/dry.  Ecchymosis noted to bilateral LE.  No obvious rashes, lesions, or ulcerations  Recent Labs  Lab 09/22/18 1832  NA 140  K 3.9  CL 102  CO2 30  BUN 12  CREATININE  0.65  GLUCOSE 117*   Recent Labs  Lab 09/22/18 1832  HGB 11.1*  HCT 37.3  WBC 31.8*  PLT 253   Dg Chest 1 View  Result Date: 09/22/2018 CLINICAL DATA:  COPD with worsening dyspnea. Audible wheeze. EXAM: CHEST  1 VIEW COMPARISON:  09/13/2018 CXR, 04/25/2018 FINDINGS: Top-normal heart size. Mild aortic atherosclerosis at the arch without aneurysmal dilatation. Evidence of prior T6 kyphoplasty with cement in place. Central vascular congestion superimposed on emphysematous lungs are identified. New ovoid opacity in the right upper lobe measuring 19 x 10 mm is noted not apparent on prior chest CT or CXR. Repeat chest CT may help for better evaluation and to exclude a pulmonary nodule. Pleural thickening or plaque, or fluid-filled bulla are some other possibilities. Biapical pleuroparenchymal scarring and thickening is redemonstrated. IMPRESSION: 1. New ovoid opacity in the right upper lobe measuring 19 x 10 mm. Repeat chest CT may help for better evaluation and to exclude a pulmonary nodule. 2. Central vascular congestion superimposed on emphysema. Electronically Signed   By: Tollie Eth M.D.   On: 09/22/2018 18:59    ASSESSMENT / PLAN:  Acute Hypoxic Respiratory Failure in setting of COPD Exacerbation and HCAP -CXR with RUL opacity (mass vs. Infiltrate) Hx: COPD, Boop -Supplemental O2 as needed to maintain O2 sats 88 to 94% -BiPAP, wean as tolerated -High risk for intubation -Follow intermittent CXR and ABG as needed -Bronchodilators and ICS -IV Solu-Medrol 60 mg q6h -Continue Vancomycin and Cefepime -Obtain CT Chest when pt stable  Meets SIRS Criteria Sepsis secondary to HCAP -Monitor fever curve -Trend WBC's and Procalcitonin -Follow cultures as above -Continue Vancomycin and Cefepime  Acute on Chronic Diastolic CHF -Cardiac monitoring -Maintain MAP >65 -IV Lasix -Follow BMP with diuresis -Obtain Echocardiogram  Anemia without signs of acute bleeding -Monitor for S/Sx of  bleeding -Trend CBC -Lovenox for VTE Prophylaxis  -Transfuse for Hgb <7    DISPOSITION: STEPDOWN GOALS OF CARE: FULL CODE VTE PROPHYLAXIS: LOVENOX UPDATES: UPDATED PT AT BEDSIDE 09/23/18  Harlon Ditty, AGACNP-BC [place] [facility] Pager: [phone] Cell: [phone]  09/23/2018, 12:56 AM

## 2018-09-23 NOTE — ED Notes (Addendum)
Pt is calling out asking for the BIPAP to be taken off because she does not want to wear it and she can't tolerate the beeping of the machine. Pt states she can't breathe and her back hurts. Pt is crying and is anxious. Explained to pt that she needs to wear the bipap or they may intubate her. Pt states "I don't care, I have been intubated 7 different times and I would rather have that then wear this thing." ER MD and DR Anne Hahn made aware. New order from Dr Darnelle Catalan for 1mg  of ativan IV given to help calm pt.

## 2018-09-24 DIAGNOSIS — J9621 Acute and chronic respiratory failure with hypoxia: Secondary | ICD-10-CM

## 2018-09-24 LAB — BLOOD CULTURE ID PANEL (REFLEXED)
ACINETOBACTER BAUMANNII: NOT DETECTED
Candida albicans: NOT DETECTED
Candida glabrata: NOT DETECTED
Candida krusei: NOT DETECTED
Candida parapsilosis: NOT DETECTED
Candida tropicalis: NOT DETECTED
Enterobacter cloacae complex: NOT DETECTED
Enterobacteriaceae species: NOT DETECTED
Enterococcus species: NOT DETECTED
Escherichia coli: NOT DETECTED
Haemophilus influenzae: NOT DETECTED
KLEBSIELLA OXYTOCA: NOT DETECTED
Klebsiella pneumoniae: NOT DETECTED
Listeria monocytogenes: NOT DETECTED
Methicillin resistance: NOT DETECTED
Neisseria meningitidis: NOT DETECTED
Proteus species: NOT DETECTED
Pseudomonas aeruginosa: NOT DETECTED
STAPHYLOCOCCUS AUREUS BCID: NOT DETECTED
Serratia marcescens: NOT DETECTED
Staphylococcus species: DETECTED — AB
Streptococcus agalactiae: NOT DETECTED
Streptococcus pneumoniae: NOT DETECTED
Streptococcus pyogenes: NOT DETECTED
Streptococcus species: NOT DETECTED

## 2018-09-24 LAB — CBC
HCT: 34.6 % — ABNORMAL LOW (ref 36.0–46.0)
Hemoglobin: 10.2 g/dL — ABNORMAL LOW (ref 12.0–15.0)
MCH: 26.4 pg (ref 26.0–34.0)
MCHC: 29.5 g/dL — ABNORMAL LOW (ref 30.0–36.0)
MCV: 89.4 fL (ref 80.0–100.0)
NRBC: 0 % (ref 0.0–0.2)
Platelets: 268 10*3/uL (ref 150–400)
RBC: 3.87 MIL/uL (ref 3.87–5.11)
RDW: 17.3 % — ABNORMAL HIGH (ref 11.5–15.5)
WBC: 26.8 10*3/uL — ABNORMAL HIGH (ref 4.0–10.5)

## 2018-09-24 LAB — BASIC METABOLIC PANEL
ANION GAP: 10 (ref 5–15)
BUN: 24 mg/dL — ABNORMAL HIGH (ref 6–20)
CO2: 30 mmol/L (ref 22–32)
Calcium: 8.8 mg/dL — ABNORMAL LOW (ref 8.9–10.3)
Chloride: 99 mmol/L (ref 98–111)
Creatinine, Ser: 0.6 mg/dL (ref 0.44–1.00)
GFR calc Af Amer: >60 mL/min (ref >60)
GFR calc non Af Amer: >60 mL/min (ref >60)
Glucose, Bld: 230 mg/dL — ABNORMAL HIGH (ref 70–99)
Potassium: 3.3 mmol/L — ABNORMAL LOW (ref 3.5–5.1)
Sodium: 139 mmol/L (ref 135–145)

## 2018-09-24 LAB — LEGIONELLA PNEUMOPHILA SEROGP 1 UR AG: L. pneumophila Serogp 1 Ur Ag: NEGATIVE

## 2018-09-24 LAB — PROCALCITONIN: Procalcitonin: <0.1 ng/mL

## 2018-09-24 LAB — BRAIN NATRIURETIC PEPTIDE: B Natriuretic Peptide: 86 pg/mL (ref 0.0–100.0)

## 2018-09-24 MED ORDER — SENNOSIDES-DOCUSATE SODIUM 8.6-50 MG PO TABS
1.0000 | ORAL_TABLET | Freq: Two times a day (BID) | ORAL | Status: DC
  Administered 2018-09-24 – 2018-09-26 (×4): 1 via ORAL
  Filled 2018-09-24 (×4): qty 1

## 2018-09-24 MED ORDER — METHYLPREDNISOLONE SODIUM SUCC 125 MG IJ SOLR: 60.0000 mg | Freq: Two times a day (BID) | INTRAMUSCULAR | Status: DC

## 2018-09-24 MED ORDER — ALPRAZOLAM 0.5 MG PO TABS
0.5000 mg | ORAL_TABLET | Freq: Three times a day (TID) | ORAL | Status: DC
  Administered 2018-09-24 – 2018-09-26 (×7): 0.5 mg via ORAL
  Filled 2018-09-24 (×7): qty 1

## 2018-09-24 MED ORDER — LORAZEPAM 2 MG/ML IJ SOLN
INTRAMUSCULAR | Status: AC
  Filled 2018-09-24: qty 1

## 2018-09-24 MED ORDER — MORPHINE SULFATE (PF) 2 MG/ML IV SOLN
2.0000 mg | INTRAVENOUS | Status: DC | PRN
  Administered 2018-09-24 – 2018-09-25 (×6): 2 mg via INTRAVENOUS
  Filled 2018-09-24 (×6): qty 1

## 2018-09-24 MED ORDER — POTASSIUM CHLORIDE CRYS ER 20 MEQ PO TBCR
40.0000 meq | EXTENDED_RELEASE_TABLET | Freq: Once | ORAL | Status: AC
  Administered 2018-09-24: 40 meq via ORAL
  Filled 2018-09-24: qty 2

## 2018-09-24 MED ORDER — LORAZEPAM 2 MG/ML IJ SOLN
1.0000 mg | Freq: Once | INTRAMUSCULAR | Status: AC
  Administered 2018-09-24: 1 mg via INTRAVENOUS

## 2018-09-24 MED ORDER — METHYLPREDNISOLONE SODIUM SUCC 40 MG IJ SOLR
40.0000 mg | Freq: Once | INTRAMUSCULAR | Status: AC
  Administered 2018-09-24: 40 mg via INTRAVENOUS

## 2018-09-24 MED ORDER — PREDNISONE 20 MG PO TABS
40.0000 mg | ORAL_TABLET | Freq: Every day | ORAL | Status: DC
  Administered 2018-09-25: 40 mg via ORAL
  Filled 2018-09-24: qty 2

## 2018-09-24 MED ORDER — METAXALONE 800 MG PO TABS
400.0000 mg | ORAL_TABLET | Freq: Three times a day (TID) | ORAL | Status: DC
  Administered 2018-09-24 – 2018-09-26 (×8): 400 mg via ORAL
  Filled 2018-09-24 (×10): qty 0.5

## 2018-09-24 NOTE — Progress Notes (Signed)
Daily Progress Note   Patient Name: Caitlyn Evans       Date: 09/24/2018 DOB: May 03, 1966  Age: 53 y.o. MRN#: [id number] Attending Physician: Jama Flavorsjie, Jude, MD Primary Care Physician: Corky DownsMasoud, Javed, MD Admit Date: 09/22/2018  Reason for Consultation/Follow-up: Establishing goals of care  Subjective: Patient in bed anxious, stating she is short of breath and "I need to be intubated so my lungs can rest and I can get better!" She complains of generalized pain and feels medications are not given as often as they should. Bedside RN administering medications as order for symptoms.   Spent detailed amount of time discussing with patient her current full code status and her symptoms. She is tearful and continues to state she is tired and so are her lungs. Discussed with her DNR/DNI although she continues to request to be intubated. She is on BiPAP and oxygen saturations are 99%, HR 89, BP 114/70. I discussed with her that she did not require intubation at this time as discussed previously by attending provider, however, in the event she did requier intubation there is a high likelihood she would not successfully be extubated and return to a decent quality of life or survive. Patient is tearful and states "she is aware and she does not want to put her family through that". I discussed she could not receive high doses of pain medications or anti-anxiety medications due to risk of respiratory depression and full code status. Patient verbalized understanding. She states she can not go against her family's wishes and requested I have a conversation with her husband. We called and spoke with her husband over speaker phone. I explained patients condition, trajectory, and her current symptoms. Patient was tearful in  conversing with him and continued to state she was tired but wanted to keep fighting for him and her granddaughter. Support given.   Patient's husband Electrical engineer(Butch), was tearful and stated he was in agreement with her being comfortable and did not want her to be intubated knowing she is high risk of demise. He continued to encourage her to remain calm and focus on breathing. Patient seemed somewhat calmer post medication administration.   I discussed with husband that it would be recommended that he and patient discuss her code status with consideration of risk and disease trajectory. Husband verbalized understanding and expressed  he does want her here but he doesn't want to watch her suffer. He expressed he will be up to visit later after work and planning to talk more private and intimately with patient and his mother about everyone's wishes. Support given.   Patient was much calmer after discussion. She continued to express her wishes to remain a full code at this time.    Length of Stay: 2  Current Medications: Scheduled Meds:  . ALPRAZolam  0.5 mg Oral TID  . budesonide (PULMICORT) nebulizer solution  0.25 mg Nebulization BID  . diltiazem  180 mg Oral Daily  . donepezil  10 mg Oral Daily  . enoxaparin (LOVENOX) injection  40 mg Subcutaneous Q24H  . furosemide  40 mg Intravenous Daily  . ipratropium-albuterol  3 mL Nebulization Q4H  . mouth rinse  15 mL Mouth Rinse BID  . metaxalone  400 mg Oral TID  . methylPREDNISolone (SOLU-MEDROL) injection  40 mg Intravenous Once  . metoprolol tartrate  25 mg Oral BID  . pantoprazole  40 mg Oral BID  . [START ON 09/25/2018] predniSONE  40 mg Oral Q breakfast  . rosuvastatin  10 mg Oral Daily  . senna-docusate  1 tablet Oral BID  . sodium chloride flush  3 mL Intravenous Q12H  . tamsulosin  0.4 mg Oral Daily    Continuous Infusions: . albuterol 10 mg/hr (09/24/18 0223)  . clindamycin (CLEOCIN) IV 600 mg (09/24/18 1433)    PRN Meds: acetaminophen  **OR** acetaminophen, guaiFENesin, ibuprofen, ipratropium-albuterol, LORazepam, morphine injection, ondansetron **OR** ondansetron (ZOFRAN) IV, oxyCODONE-acetaminophen, polyethylene glycol  Physical Exam Vitals signs and nursing note reviewed.  Constitutional:      General: She is awake.     Appearance: She is underweight. She is ill-appearing.     Comments: Thin, frail, chronically and critically ill on BiPAP  Cardiovascular:     Rate and Rhythm: Normal rate.     Pulses: Normal pulses.     Heart sounds: Normal heart sounds.  Pulmonary:     Breath sounds: Decreased breath sounds, wheezing and rhonchi present.     Comments: Shortness of breath on BiPAP  Skin:    General: Skin is warm and dry.     Findings: Bruising and ecchymosis present.  Neurological:     Mental Status: She is alert and oriented to person, place, and time.  Psychiatric:        Mood and Affect: Mood is anxious.             Vital Signs: BP 127/76   Pulse 86   Temp 98.2 F (36.8 C) (Oral)   Resp 19   Ht 4\' 10"  (1.473 m)   Wt 56.2 kg   SpO2 97%   BMI 25.89 kg/m  SpO2: SpO2: 97 % O2 Device: O2 Device: Bi-PAP O2 Flow Rate: O2 Flow Rate (L/min): 3 L/min  Intake/output summary:   Intake/Output Summary (Last 24 hours) at 09/24/2018 1501 Last data filed at 09/24/2018 1054 Gross per 24 hour  Intake 873 ml  Output 250 ml  Net 623 ml   LBM: Last BM Date: 09/21/18 Baseline Weight: Weight: 60.3 kg Most recent weight: Weight: 56.2 kg       Palliative Assessment/Data: PPS 30%    Flowsheet Rows     Most Recent Value  Intake Tab  Referral Department  Hospitalist  Unit at Time of Referral  ICU  Palliative Care Primary Diagnosis  Pulmonary  Date Notified  09/23/18  Palliative Care Type  New Palliative care  Reason for referral  Clarify Goals of Care  Date of Admission  09/22/18  Date first seen by Palliative Care  09/23/18  # of days Palliative referral response time  0 Day(s)  # of days IP prior to  Palliative referral  1  Clinical Assessment  Psychosocial & Spiritual Assessment  Palliative Care Outcomes      Patient Active Problem List   Diagnosis Date Noted  . Acute on chronic respiratory failure with hypoxia (HCC) 09/09/2018  . Esophagitis, unspecified   . Stomach irritation   . Hiatal hernia   . COPD (chronic obstructive pulmonary disease) (HCC) 07/19/2016  . Acute on chronic respiratory failure (HCC) 05/09/2016  . Noncompliance with medication regimen 05/09/2016  . COPD with acute exacerbation (HCC) 02/05/2016  . Acute exacerbation of chronic obstructive pulmonary disease (COPD) (HCC)   . Acute respiratory failure (HCC)   . Acute respiratory failure with hypoxia (HCC) 12/05/2015  . BOOP (bronchiolitis obliterans with organizing pneumonia) (HCC) 12/05/2015  . Depression, major, single episode, moderate (HCC) 07/14/2015  . Adjustment disorder with anxiety 07/14/2015  . COPD exacerbation (HCC) 07/14/2015  . Special screening for malignant neoplasms, colon   . Swallowing difficulty   . Loss of weight   . Esophagogastric ulcer   . Feline esophagus   . Hypertension 05/30/2015  . Emphysema lung (HCC) 05/30/2015  . Dysphagia, pharyngoesophageal phase 05/30/2015    Palliative Care Assessment & Plan   Patient Profile: 53 y.o. female admitted on 09/22/2018 from home with complaints of shortness of breath. She has a past medical history significant for COPD, BOOP, MI, hypertension, anxiety, CHF, MI, and chronic back pain (kyphoplasty). During her ED course she appeared tachypneic and wheezing. She was given multiple breathing treatments and IV steroids with minimum response. She was initiated on BiPAP for respiratory support. Chest x-ray showed right upper lobe mass versus infiltrate, and chronic changes. WBC 31.8, hgb 11.1, BNP 68.0, lactic acid 1.23. Since admission she has been weaned to nasal cannula and tolerating. Complains of shortness of breath with exertion. She is receiving  IV Solu-Medrol, Vancomycin, and Cefepime post cultures. Palliative Medicine consulted for goals of care discussion.   Recommendations/Plan:  FULL CODE-at patient/husband's request  Continue to treat the treatable  Patient continues to request increase in Ativan and morphine. Explained this was not appropriate in full code setting due to potential of compromising respiratory status. Patient and husband verbalizes agreement and intended to discuss amongst themselves further wishes regarding DNR/DNI.   PMT will continue to follow   Goals of Care and Additional Recommendations:  Limitations on Scope of Treatment: Full Scope Treatment  Code Status:    Code Status Orders  (From admission, onward)         Start     Ordered   09/22/18 2017  Full code  Continuous     09/22/18 2017        Code Status History    Date Active Date Inactive Code Status Order ID Comments User Context   09/17/2018 1005 09/17/2018 1334 Full Code [id number]  Kennedy BuckerMenz, Michael, MD Inpatient   09/13/2018 1104 09/15/2018 1529 Full Code [id number]  Alford HighlandWieting, Richard, MD ED   09/09/2018 2113 09/10/2018 1939 Full Code [id number]  Mayo, Allyn KennerKaty Dodd, MD ED   08/31/2018 1507 09/02/2018 1509 Full Code [id number]  Ihor AustinPyreddy, Pavan, MD ED   11/18/2017 2000 11/25/2017 1802 Full Code [id number]  Ihor AustinPyreddy, Pavan, MD ED   07/01/2017 2023 07/04/2017 1648 Full Code [id number]  Altamese Dilling, MD ED   07/19/2016 1148 07/20/2016 2010 Full Code [id number]  Adrian Saran, MD Inpatient   05/09/2016 1440 05/13/2016 1437 Full Code [id number]  Merwyn Katos, MD ED   02/05/2016 1914 02/08/2016 1452 Full Code [id number]  Ramonita Lab, MD Inpatient   12/05/2015 2302 12/09/2015 1535 Full Code [id number]  Oralia Manis, MD Inpatient    Advance Directive Documentation     Most Recent Value  Type of Advance Directive  Healthcare Power of Attorney  Pre-existing out of facility DNR order (yellow form or pink MOST form)  -  "MOST" Form in Place?  -       Prognosis:   Guarded to Poor   Discharge Planning:  To Be Determined  Care plan was discussed with patient, patient's husband, and bedside RN.   Thank you for allowing the Palliative Medicine Team to assist in the care of this patient.   Total Time 45 min  Prolonged Time Billed  NO       Greater than 50%  of this time was spent counseling and coordinating care related to the above assessment and plan.  Willette Alma, AGPCNP-BC Palliative Medicine Team  Pager: [phone] Amion: Thea Alken   Please contact Palliative Medicine Team phone at [phone] for questions and concerns.

## 2018-09-24 NOTE — Progress Notes (Signed)
PHARMACY - PHYSICIAN COMMUNICATION CRITICAL VALUE ALERT - BLOOD CULTURE IDENTIFICATION (BCID)  Caitlyn Evans is an 53 y.o. female who presented to [facility] on 09/22/2018 with a chief complaint of   Assessment:  1/4 Staph spp, mecA(-) (include suspected source if known)  Name of physician (or Provider) Contacted: Elvina Sidle  Current antibiotics: clindamycin  Changes to prescribed antibiotics recommended:  n/a  Results for orders placed or performed during the hospital encounter of 09/22/18  Blood Culture ID Panel (Reflexed) (Collected: 09/22/2018  8:08 PM)  Result Value Ref Range   Enterococcus species NOT DETECTED NOT DETECTED   Listeria monocytogenes NOT DETECTED NOT DETECTED   Staphylococcus species DETECTED (A) NOT DETECTED   Staphylococcus aureus (BCID) NOT DETECTED NOT DETECTED   Methicillin resistance NOT DETECTED NOT DETECTED   Streptococcus species NOT DETECTED NOT DETECTED   Streptococcus agalactiae NOT DETECTED NOT DETECTED   Streptococcus pneumoniae NOT DETECTED NOT DETECTED   Streptococcus pyogenes NOT DETECTED NOT DETECTED   Acinetobacter baumannii NOT DETECTED NOT DETECTED   Enterobacteriaceae species NOT DETECTED NOT DETECTED   Enterobacter cloacae complex NOT DETECTED NOT DETECTED   Escherichia coli NOT DETECTED NOT DETECTED   Klebsiella oxytoca NOT DETECTED NOT DETECTED   Klebsiella pneumoniae NOT DETECTED NOT DETECTED   Proteus species NOT DETECTED NOT DETECTED   Serratia marcescens NOT DETECTED NOT DETECTED   Haemophilus influenzae NOT DETECTED NOT DETECTED   Neisseria meningitidis NOT DETECTED NOT DETECTED   Pseudomonas aeruginosa NOT DETECTED NOT DETECTED   Candida albicans NOT DETECTED NOT DETECTED   Candida glabrata NOT DETECTED NOT DETECTED   Candida krusei NOT DETECTED NOT DETECTED   Candida parapsilosis NOT DETECTED NOT DETECTED   Candida tropicalis NOT DETECTED NOT DETECTED    Lawerence Dery S 09/24/2018  5:35 AM

## 2018-09-24 NOTE — Progress Notes (Signed)
[facility] - [place] at [facility]   PATIENT NAME: Caitlyn Evans    MR#:  [id number]  DATE OF BIRTH:  10/08/1965  SUBJECTIVE:  CHIEF COMPLAINT:   Chief Complaint  Patient presents with  . Shortness of Breath    Patient reported having had a fall at home.  Nursing staff confirmed patient which recorded multiple admissions.  Patient has intermittent episodes of anxiety attacks.  Requested to be placed back on BiPAP.  Seen by palliative care team today.  Updated husband present at bedside.  REVIEW OF SYSTEMS:  Review of Systems  Constitutional: Negative for chills and fever.  HENT: Negative for hearing loss and tinnitus.   Eyes: Negative for blurred vision and double vision.  Respiratory: Positive for cough and shortness of breath.   Cardiovascular: Negative for chest pain and palpitations.  Gastrointestinal: Negative for heartburn, nausea and vomiting.  Genitourinary: Negative for dysuria and urgency.  Musculoskeletal: Positive for falls.       Multiple nonspecific joint pains which is chronic.  Skin: Negative for itching and rash.  Neurological: Negative for dizziness and headaches.  Psychiatric/Behavioral: Negative for depression and hallucinations.    DRUG ALLERGIES:   Allergies  Allergen Reactions  . Codeine Hives, Nausea And Vomiting and Nausea Only  . Penicillins Hives and Other (See Comments)    Has patient had a PCN reaction causing immediate rash, facial/tongue/throat swelling, SOB or lightheadedness with hypotension: No Has patient had a PCN reaction causing severe rash involving mucus membranes or skin necrosis: No Has patient had a PCN reaction that required hospitalization No Has patient had a PCN reaction occurring within the last 10 years: No If all of the above answers are "NO", then may proceed with Cephalosporin use. Other reaction(s): Other (See Comments) Has patient had a PCN   VITALS:  Blood pressure 108/61, pulse 79, temperature  98.2 F (36.8 C), temperature source Oral, resp. rate 18, height 4\' 10"  (1.473 m), weight 56.2 kg, SpO2 97 %. PHYSICAL EXAMINATION:   Physical Exam  Constitutional: She is oriented to person, place, and time. She appears well-developed and well-nourished.  HENT:  Head: Normocephalic and atraumatic.  Eyes: Pupils are equal, round, and reactive to light. Conjunctivae and EOM are normal.  Neck: Normal range of motion. Neck supple.  Cardiovascular: Normal rate, regular rhythm and normal heart sounds.  Respiratory: Effort normal and breath sounds normal.  GI: Soft. Bowel sounds are normal. She exhibits no distension.  Musculoskeletal: Normal range of motion.        General: No edema.  Neurological: She is alert and oriented to person, place, and time.  Skin: Skin is warm.  Psychiatric: She has a normal mood and affect. Her behavior is normal.   LABORATORY PANEL:  Female CBC Recent Labs  Lab 09/24/18 0534  WBC 26.8*  HGB 10.2*  HCT 34.6*  PLT 268   ------------------------------------------------------------------------------------------------------------------ Chemistries  Recent Labs  Lab 09/24/18 0534  NA 139  K 3.3*  CL 99  CO2 30  GLUCOSE 230*  BUN 24*  CREATININE 0.60  CALCIUM 8.8*   RADIOLOGY:  No results found. ASSESSMENT AND PLAN:   1. Acute on chronic hypoxic respiratory failure secondary to COPD exacerbation and multifocal right-sided pneumonia confirmed on CT chest Patient being managed by critical care team with IV antibiotics, steroids with IV Solu-Medrol.  Nebulizer treatments. Supplemental oxygen. 2D echocardiogram noted to have been done with ejection fraction of 55 to 60%.  Patient reported to have multiple recurrent  admissions with similar complaints. Palliative consult placed by ICU team and they evaluated patient today.  Patient wishes to continue with FULL CODE.  Patient wishes to discuss with husband further regarding further wishes regarding  DNR/DNI.   Patient disease 3 L of oxygen at home.  BiPAP as needed.  Sepsis secondary to pneumonia.  Present on admission. IV antibiotics with pharmacy assisting with dosing.  IV fluids previously discontinued due to underlying CHF.  Currently hemodynamically stable. Leukocytosis likely related to ongoing steroid treatment  2. Chronic back pain.  Pain medications as needed  3. Acute on chronic diastolic congestive heart failure.  IV Lasix now.  Monitor input and output.  Repeat BMP in the morning Follow-up on 2D echocardiogram report when grade  DVT prophylaxis with Lovenox  Management plans discussed with the patient, family and they are in agreement.  CODE STATUS: Full Code  TOTAL TIME TAKING CARE OF THIS PATIENT: 39 minutes.   More than 50% of the time was spent in counseling/coordination of care: YES  POSSIBLE D/C IN 2 DAYS, DEPENDING ON CLINICAL CONDITION.   Jamorris Ndiaye M.D on 09/24/2018 at 3:40 PM  Between 7am to 6pm - Pager - [phone]  After 6pm go to [email or web address] - Social research officer, governmentpassword EPAS [facility]  [facility]  Office  [phone]  CC: Primary care physician; Corky DownsMasoud, Javed, MD  Note: This dictation was prepared with Dragon dictation along with smaller phrase technology. Any transcriptional errors that result from this process are unintentional.

## 2018-09-24 NOTE — Care Management Note (Signed)
Case Management Note  Patient Details  Name: DELIMAR SISEMORE MRN: [id number] Date of Birth: 04-23-1966  Subjective/Objective:  Patient admitted with COPD exacerbation requiring Bipap.  Patient reporting that she is anxious and can't breath, she states " I want to be put on life support".  Patient is from home and lives with her husband.  Patient has chronic O2 at home with [facility].  RNCM consulted for home health and palliative needs.  RNCM asked if patient would like to go home with home health and maybe outpatient palliative, patient responds none of the above, she does not want anything when she goes back home, pt endorses that she is independent and requires no HH or equipment. Husband is at the bedside and would like to discuss home health and palliative at a later time when the patient is feeling better.  RNCM will follow up at a later date when patient is more stable.    Robbie Lis RN BSN  [phone]                    Action/Plan:   Expected Discharge Date:                  Expected Discharge Plan:  Home w Home Health Services  In-House Referral:     Discharge planning Services  CM Consult  Post Acute Care Choice:    Choice offered to:     DME Arranged:    DME Agency:     HH Arranged:    HH Agency:     Status of Service:  In process, will continue to follow  If discussed at Long Length of Stay Meetings, dates discussed:    Additional Comments:  Allayne Butcher, RN 09/24/2018, 11:36 AM

## 2018-09-24 NOTE — Progress Notes (Signed)
Follow up - Critical Care Medicine Note  Patient Details:    Caitlyn Evans is an 53 y.o. female. 53 year old former smoker with very severe COPD and a history of recurrent BOOP (COP) with resultant fibrosis.  She has multiple admissions to [facility] for COPD exacerbations.  Presented with an exacerbation and multifocal pneumonia suggestive of aspiration I suspect due to medication misadventure.  The patient has chronic pain issues including recent vertebral compression fractures status post kyphoplasty.  The patient has a propensity for falls at home. Admitted to stepdown due to need for BiPAP.  Lines, Airways, Drains:    Anti-infectives:  Anti-infectives (From admission, onward)   Start     Dose/Rate Route Frequency Ordered Stop   09/23/18 1400  clindamycin (CLEOCIN) IVPB 600 mg     600 mg 100 mL/hr over 30 Minutes Intravenous Every 8 hours 09/23/18 1052     09/23/18 0900  vancomycin (VANCOCIN) IVPB 1000 mg/200 mL premix  Status:  Discontinued     1,000 mg 200 mL/hr over 60 Minutes Intravenous Every 24 hours 09/22/18 2343 09/23/18 1052   09/23/18 0400  ceFEPIme (MAXIPIME) 2 g in sodium chloride 0.9 % 100 mL IVPB  Status:  Discontinued     2 g 200 mL/hr over 30 Minutes Intravenous Every 8 hours 09/22/18 2221 09/23/18 1052   09/22/18 1930  vancomycin (VANCOCIN) IVPB 1000 mg/200 mL premix     1,000 mg 200 mL/hr over 60 Minutes Intravenous  Once 09/22/18 1921 09/22/18 2243   09/22/18 1930  ceFEPIme (MAXIPIME) 2 g in sodium chloride 0.9 % 100 mL IVPB     2 g 200 mL/hr over 30 Minutes Intravenous  Once 09/22/18 1921 09/22/18 2104      Microbiology: Results for orders placed or performed during the hospital encounter of 09/22/18  Blood Culture (routine x 2)     Status: None (Preliminary result)   Collection Time: 09/22/18  8:08 PM  Result Value Ref Range Status   Specimen Description   Final    BLOOD RIGHT ANTECUBITAL Performed at [facility], [address],  [place], [place] [address]    Special Requests   Final    BOTTLES DRAWN AEROBIC AND ANAEROBIC Blood Culture adequate volume Performed at [facility], [address] [place], [place] [address]    Culture  Setup Time   Final    GRAM POSITIVE COCCI AEROBIC BOTTLE ONLY CRITICAL RESULT CALLED TO, READ BACK BY AND VERIFIED WITHSusy Frizzle [facility] AT 7253 09/24/18 SDR Performed at [facility], [address] [place], [place] [address]    Culture GRAM POSITIVE COCCI  Final   Report Status PENDING  Incomplete  Blood Culture ID Panel (Reflexed)     Status: Abnormal   Collection Time: 09/22/18  8:08 PM  Result Value Ref Range Status   Enterococcus species NOT DETECTED NOT DETECTED Final   Listeria monocytogenes NOT DETECTED NOT DETECTED Final   Staphylococcus species DETECTED (A) NOT DETECTED Final    Comment: Methicillin (oxacillin) susceptible coagulase negative staphylococcus. Possible blood culture contaminant (unless isolated from more than one blood culture draw or clinical case suggests pathogenicity). No antibiotic treatment is indicated for blood  culture contaminants. CRITICAL RESULT CALLED TO, READ BACK BY AND VERIFIED WITH:  MATT MCBANE AT 3474 09/24/18 SDR    Staphylococcus aureus (BCID) NOT DETECTED NOT DETECTED Final   Methicillin resistance NOT DETECTED NOT DETECTED Final   Streptococcus species NOT DETECTED NOT DETECTED Final   Streptococcus  agalactiae NOT DETECTED NOT DETECTED Final   Streptococcus pneumoniae NOT DETECTED NOT DETECTED Final   Streptococcus pyogenes NOT DETECTED NOT DETECTED Final   Acinetobacter baumannii NOT DETECTED NOT DETECTED Final   Enterobacteriaceae species NOT DETECTED NOT DETECTED Final   Enterobacter cloacae complex NOT DETECTED NOT DETECTED Final   Escherichia coli NOT DETECTED NOT DETECTED Final   Klebsiella oxytoca NOT DETECTED NOT DETECTED Final   Klebsiella pneumoniae NOT DETECTED NOT DETECTED Final   Proteus species NOT DETECTED NOT  DETECTED Final   Serratia marcescens NOT DETECTED NOT DETECTED Final   Haemophilus influenzae NOT DETECTED NOT DETECTED Final   Neisseria meningitidis NOT DETECTED NOT DETECTED Final   Pseudomonas aeruginosa NOT DETECTED NOT DETECTED Final   Candida albicans NOT DETECTED NOT DETECTED Final   Candida glabrata NOT DETECTED NOT DETECTED Final   Candida krusei NOT DETECTED NOT DETECTED Final   Candida parapsilosis NOT DETECTED NOT DETECTED Final   Candida tropicalis NOT DETECTED NOT DETECTED Final    Comment: Performed at [facility], [address] [place], [place] [address]  MRSA PCR Screening     Status: None   Collection Time: 09/23/18  1:25 AM  Result Value Ref Range Status   MRSA by PCR NEGATIVE NEGATIVE Final    Comment:        The GeneXpert MRSA Assay (FDA approved for NASAL specimens only), is one component of a comprehensive MRSA colonization surveillance program. It is not intended to diagnose MRSA infection nor to guide or monitor treatment for MRSA infections. Performed at [facility], [address] [place], [place] [address]   Blood Culture (routine x 2)     Status: None (Preliminary result)   Collection Time: 09/23/18  1:41 AM  Result Value Ref Range Status   Specimen Description BLOOD LEFT ARM  Final   Special Requests   Final    BOTTLES DRAWN AEROBIC AND ANAEROBIC Blood Culture adequate volume   Culture   Final    NO GROWTH 1 DAY Performed at [facility], [address] [place], [place] [address]    Report Status PENDING  Incomplete    Best Practice/Protocols:  VTE Prophylaxis: Lovenox (prophylaxtic dose)   Events:   Studies: Dg Chest 1 View  Result Date: 09/22/2018 CLINICAL DATA:  COPD with worsening dyspnea. Audible wheeze. EXAM: CHEST  1 VIEW COMPARISON:  09/13/2018 CXR, 04/25/2018 FINDINGS: Top-normal heart size. Mild aortic atherosclerosis at the arch without aneurysmal dilatation. Evidence of prior T6 kyphoplasty  with cement in place. Central vascular congestion superimposed on emphysematous lungs are identified. New ovoid opacity in the right upper lobe measuring 19 x 10 mm is noted not apparent on prior chest CT or CXR. Repeat chest CT may help for better evaluation and to exclude a pulmonary nodule. Pleural thickening or plaque, or fluid-filled bulla are some other possibilities. Biapical pleuroparenchymal scarring and thickening is redemonstrated. IMPRESSION: 1. New ovoid opacity in the right upper lobe measuring 19 x 10 mm. Repeat chest CT may help for better evaluation and to exclude a pulmonary nodule. 2. Central vascular congestion superimposed on emphysema. Electronically Signed   By: Tollie Eth M.D.   On: 09/22/2018 18:59   Dg Thoracic Spine 2 View  Result Date: 09/17/2018 CLINICAL DATA:  T6 kyphoplasty. EXAM: THORACIC SPINE 2 VIEWS; DG C-ARM 61-120 MIN COMPARISON:  Thoracic spine MRI 09/14/2018 FLUOROSCOPY TIME:  Fluoroscopy Time:  2 minutes 13 seconds Radiation Exposure Index (if provided by the fluoroscopic device): 20.6  mGy FINDINGS: Two intraoperative fluoroscopic images of the thoracic spine are provided. Confirmation of spinal numbering is limited by the small field-of-view, however there has been interval augmentation of a compression fracture presumably corresponding to the T6 fracture shown on the recent MRI. There is bilateral spread of methylmethacrylate within the vertebral body without evidence of significant extravasation. IMPRESSION: Intraoperative images during T6 kyphoplasty. Electronically Signed   By: Sebastian Ache M.D.   On: 09/17/2018 09:59   Dg Thoracic Spine 2 View  Result Date: 09/13/2018 CLINICAL DATA:  Patient reports thoracic back pain without injury, onset today. Denies any previous injury to thoracic spine. EXAM: THORACIC SPINE 2 VIEWS COMPARISON:  09/13/2018 and chest CT on 04/25/2018 FINDINGS: There is 30% wedge compression fracture T6, new since August 2019 but indeterminate  age. There is a superior endplate fracture of T12, with 10% loss of anterior height, also new since prior study but of indeterminate age. Posterior ribs are unremarkable. IMPRESSION: Compression fractures of T6 and T12, age indeterminate but new since August 2019. Electronically Signed   By: Norva Pavlov M.D.   On: 09/13/2018 17:33   Ct Chest Wo Contrast  Result Date: 09/23/2018 CLINICAL DATA:  Acute resp illness, > 47 years old. Increasing shortness of breath. Recent kyphoplasty EXAM: CT CHEST WITHOUT CONTRAST TECHNIQUE: Multidetector CT imaging of the chest was performed following the standard protocol without IV contrast. COMPARISON:  Radiographs yesterday. Chest CT 04/25/2018 FINDINGS: Cardiovascular: The heart is normal in size. Thoracic aorta is normal in caliber with mild atherosclerosis. No pericardial effusion. Mediastinum/Nodes: Small mediastinal lymph nodes not enlarged by size criteria. Slight right hilar prominence, limited assessment for adenopathy given lack of IV contrast. The esophagus is decompressed. Lungs/Pleura: Multifocal nodular and ground-glass opacities throughout the right upper, lower, and middle lobes, greatest in the perihilar right middle lobe. Majority of these opacities are 1-2 cm. Right greater than left bronchial thickening, with scattered areas of mucous plugging. Chain suture again seen in the left upper lobe. No acute left lung airspace disease. Mild to moderate emphysema. No evidence pulmonary edema. No pleural effusion. Upper Abdomen: Small hiatal hernia. No acute findings. Musculoskeletal: Kyphoplasty material within T6 vertebral body. No evidence of extravasated cement beyond the vertebral body. Mild T12 compression fracture as seen on prior imaging. Remote sternal fracture. IMPRESSION: 1. Multifocal nodular and ground-glass opacities throughout the right lung, greatest in the perihilar right middle lobe. Findings her most consistent with multifocal pneumonia.  Recommend continued radiographic follow-up. 2. Mild to moderate emphysema and bronchial thickening. Aortic Atherosclerosis (ICD10-I70.0) and Emphysema (ICD10-J43.9). Electronically Signed   By: Narda Rutherford M.D.   On: 09/23/2018 01:43   Mr Thoracic Spine Wo Contrast  Result Date: 09/14/2018 CLINICAL DATA:  Severe back pain. Compression fracture of the thoracic spine. EXAM: MRI THORACIC SPINE WITHOUT CONTRAST TECHNIQUE: Multiplanar, multisequence MR imaging of the thoracic spine was performed. No intravenous contrast was administered. COMPARISON:  Radiographs dated 09/13/2018 and CT scan of the chest dated 04/25/2018 FINDINGS: Alignment:  There is slight accentuation of the thoracic kyphosis. Vertebrae: There is a new benign-appearing compression fracture of T6. Slight protrusion of the posterior margin of T6 into the spinal canal. There is a mild old slight compression fracture and Schmorl's node in the superior endplate of T12, but new since the CT scan of 04/25/2018. Cord:  Normal signal and morphology. Paraspinal and other soft tissues: Negative. Disc levels: C7-T1 through T4-5: Negative. T5-6: Benign-appearing compression fracture of the superior endplate of T6 extending into the  base of both pedicles. Slight protrusion of the posterior margin of T6 into the spinal canal without neural impingement. No foraminal stenosis. T7-8 through T10-11: Negative. T11-12: Healed mild compression fracture and Schmorl's node in the superior endplate of T12 with slight protrusion of the posterosuperior aspect of T12 into the spinal canal without neural impingement. No spinal or foraminal stenosis. T12-L1 and L1-2: Negative. IMPRESSION: 1. New benign-appearing compression fracture of T6 without neural impingement. 2. Old healed compression fracture of the superior endplate of T12 without neural impingement. Electronically Signed   By: Francene BoyersJames  Maxwell M.D.   On: 09/14/2018 17:03   Dg Chest Port 1 View  Result Date:  09/13/2018 CLINICAL DATA:  Respiratory distress EXAM: PORTABLE CHEST 1 VIEW COMPARISON:  Chest radiograph 09/10/2018 FINDINGS: Monitoring leads overlie the patient. Stable cardiac and mediastinal contours. No consolidative pulmonary opacities. No pleural effusion or pneumothorax. IMPRESSION: No acute cardiopulmonary process. Electronically Signed   By: Annia Beltrew  Davis M.D.   On: 09/13/2018 09:40   Dg Chest Port 1 View  Result Date: 09/10/2018 CLINICAL DATA:  Acute onset of shortness of breath. EXAM: PORTABLE CHEST 1 VIEW COMPARISON:  Chest radiograph 09/09/2018 FINDINGS: The lungs are well-aerated and clear. There is no evidence of focal opacification, pleural effusion or pneumothorax. The lung apices are partially obscured by the patient's head. The cardiomediastinal silhouette is within normal limits. No acute osseous abnormalities are seen. IMPRESSION: No acute cardiopulmonary process seen. Electronically Signed   By: Roanna RaiderJeffery  Chang M.D.   On: 09/10/2018 05:01   Dg Chest Portable 1 View  Result Date: 09/09/2018 CLINICAL DATA:  Stage IV COPD with dyspnea EXAM: PORTABLE CHEST 1 VIEW COMPARISON:  08/31/2018 FINDINGS: Mild pulmonary hyperinflation. Normal heart size and mediastinal contours with minimal aortic atherosclerosis at the arch. Vascular calcifications project over the right lung apex as well. No thoracic aortic aneurysm. No effusion nor pneumothorax. No acute osseous abnormality. IMPRESSION: Mild pulmonary hyperinflation without active pulmonary disease. Electronically Signed   By: Tollie Ethavid  Kwon M.D.   On: 09/09/2018 17:14   Dg Chest Portable 1 View  Result Date: 08/31/2018 CLINICAL DATA:  Respiratory distress. EXAM: PORTABLE CHEST 1 VIEW COMPARISON:  Radiographs of April 25, 2018. FINDINGS: The heart size and mediastinal contours are within normal limits. No pneumothorax or pleural effusion is noted. Stable right apical scarring is noted. No acute pulmonary disease is noted. The visualized  skeletal structures are unremarkable. IMPRESSION: No acute cardiopulmonary abnormality seen. Electronically Signed   By: Lupita RaiderJames  Green Jr, M.D.   On: 08/31/2018 12:12   Dg C-arm 1-60 Min  Result Date: 09/17/2018 CLINICAL DATA:  T6 kyphoplasty. EXAM: THORACIC SPINE 2 VIEWS; DG C-ARM 61-120 MIN COMPARISON:  Thoracic spine MRI 09/14/2018 FLUOROSCOPY TIME:  Fluoroscopy Time:  2 minutes 13 seconds Radiation Exposure Index (if provided by the fluoroscopic device): 20.6 mGy FINDINGS: Two intraoperative fluoroscopic images of the thoracic spine are provided. Confirmation of spinal numbering is limited by the small field-of-view, however there has been interval augmentation of a compression fracture presumably corresponding to the T6 fracture shown on the recent MRI. There is bilateral spread of methylmethacrylate within the vertebral body without evidence of significant extravasation. IMPRESSION: Intraoperative images during T6 kyphoplasty. Electronically Signed   By: Sebastian AcheAllen  Grady M.D.   On: 09/17/2018 09:59    Consults:    Subjective:    Overnight Issues:  Able to intermittently come off of BiPAP.  Very tearful this morning.  She is asking for anxiolytics and pain medication.  No  overt respiratory distress.  Complains bitterly of back pain. Objective:  Vital signs for last 24 hours: Temp:  [97.6 F (36.4 C)-98.2 F (36.8 C)] 98.2 F (36.8 C) (01/09 0800) Pulse Rate:  [67-110] 69 (01/09 1800) Resp:  [12-37] 14 (01/09 1800) BP: (95-148)/(54-84) 109/64 (01/09 1800) SpO2:  [93 %-100 %] 98 % (01/09 1800) FiO2 (%):  [30 %-35 %] 30 % (01/09 1238) Weight:  [56.2 kg] 56.2 kg (01/09 0341)  Hemodynamic parameters for last 24 hours:    Intake/Output from previous day: 01/08 0701 - 01/09 0700 In: 1110 [P.O.:960; IV Piggyback:150] Out: 1500 [Urine:1500]  Intake/Output this shift: Total I/O In: 40.2 [IV Piggyback:40.2] Out: -   Vent settings for last 24 hours: FiO2 (%):  [30 %-35 %] 30 %  Physical  Exam:  General: Chronically ill appearing female, appears much older than stated age, sitting in bed, on nasal cannula O2, uses accessories of respiration, chronic fashion.   Neuro:  Awake, A&O x4, no focal deficits, speech clear, emotionally labile. HEENT:  Atraumatic, normocephalic, neck supple, no JVD Cardiovascular:  RRR, s1s2, no M/R/G, 2+ pulses throughout Lungs:   No wheezing, good air entry bilaterally, rhonchi throughout. Abdomen:  Obese, soft, nontender, nondistended, no guarding or rebound tenderness, BS+ x4 Musculoskeletal:  Muscle wasting, lidocaine patch @ thoracic area, no edema Skin: Warm/dry.  Ecchymosis noted to bilateral LE.  No obvious rashes, lesions, or ulcerations   Assessment/Plan:   1.  Acute on chronic hypoxic respiratory failure due to COPD exacerbation: She does have bilateral pneumonia, no organism specified.  I suspect by her CT scan performed yesterday, that this is likely due to aspiration.  Will start clindamycin for potential aspiration.  She has been noted to have issues with medication misadventure and become somnolent when using excessive doses of pain medication.  I suspect she may have aspirated during 1 of these episodes.  2.  Very severe COPD with history of recurrent BOOP (now COP) with attendant fibrosis: The patient has a history of multiple intubations in the past.  Continue nebulization treatments, decrease steroids as she is becoming somewhat labile and anxious this may be due to mild steroid psychosis.  3.  Diastolic dysfunction of the left ventricle:  Responded well to diuretics yesterday.  Continue to monitor for decompensation.  2D echo pending.  4.  Anemia of chronic disease: Without signs of bleeding, monitor.  5.  Recent compression fractures of the thoracic spine: Status post kyphoplasty.  6.  Deconditioning, severe: Patient may require skilled nursing facility.  Patient has very severe chronic obstructive pulmonary disease.  She seems  on one hand to the side of comfort and another hand wants full scope of care.  Palliative care has been consulted to assist with determining goals of care.    LOS: 2 days   Additional comments:Multidisciplinary rounds were performed with the ICU team.   Josiah Lobo. Laura Anel Purohit,MD [facility]  09/24/2018

## 2018-09-25 ENCOUNTER — Encounter: Payer: Self-pay | Admitting: *Deleted

## 2018-09-25 LAB — CBC
HCT: 34.5 % — ABNORMAL LOW (ref 36.0–46.0)
Hemoglobin: 10.1 g/dL — ABNORMAL LOW (ref 12.0–15.0)
MCH: 26.1 pg (ref 26.0–34.0)
MCHC: 29.3 g/dL — ABNORMAL LOW (ref 30.0–36.0)
MCV: 89.1 fL (ref 80.0–100.0)
Platelets: 270 10*3/uL (ref 150–400)
RBC: 3.87 MIL/uL (ref 3.87–5.11)
RDW: 17.2 % — ABNORMAL HIGH (ref 11.5–15.5)
WBC: 24 10*3/uL — AB (ref 4.0–10.5)
nRBC: 0.2 % (ref 0.0–0.2)

## 2018-09-25 LAB — PROCALCITONIN

## 2018-09-25 LAB — MAGNESIUM: Magnesium: 2.4 mg/dL (ref 1.7–2.4)

## 2018-09-25 LAB — BASIC METABOLIC PANEL
Anion gap: 7 (ref 5–15)
BUN: 29 mg/dL — ABNORMAL HIGH (ref 6–20)
CO2: 34 mmol/L — ABNORMAL HIGH (ref 22–32)
Calcium: 8.9 mg/dL (ref 8.9–10.3)
Chloride: 100 mmol/L (ref 98–111)
Creatinine, Ser: 0.62 mg/dL (ref 0.44–1.00)
GFR calc Af Amer: >60 mL/min (ref >60)
GFR calc non Af Amer: >60 mL/min (ref >60)
Glucose, Bld: 122 mg/dL — ABNORMAL HIGH (ref 70–99)
Potassium: 4 mmol/L (ref 3.5–5.1)
SODIUM: 141 mmol/L (ref 135–145)

## 2018-09-25 MED ORDER — POLYETHYLENE GLYCOL 3350 17 G PO PACK
17.0000 g | PACK | Freq: Every day | ORAL | Status: DC
  Administered 2018-09-25: 19:00:00 17 g via ORAL
  Filled 2018-09-25 (×2): qty 1

## 2018-09-25 MED ORDER — OXYCODONE-ACETAMINOPHEN 5-325 MG PO TABS
1.0000 | ORAL_TABLET | Freq: Four times a day (QID) | ORAL | Status: DC | PRN
  Administered 2018-09-26: 1 via ORAL
  Filled 2018-09-25: qty 1

## 2018-09-25 MED ORDER — PREDNISONE 20 MG PO TABS
30.0000 mg | ORAL_TABLET | Freq: Every day | ORAL | Status: AC
  Administered 2018-09-26: 30 mg via ORAL
  Filled 2018-09-25: qty 1

## 2018-09-25 MED ORDER — FUROSEMIDE 40 MG PO TABS
40.0000 mg | ORAL_TABLET | Freq: Every day | ORAL | Status: DC
  Filled 2018-09-25: qty 1

## 2018-09-25 MED ORDER — PREDNISONE 20 MG PO TABS: 20.0000 mg | ORAL_TABLET | Freq: Every day | ORAL | Status: DC

## 2018-09-25 MED ORDER — PREDNISONE 10 MG PO TABS: 10.0000 mg | ORAL_TABLET | Freq: Every day | ORAL | Status: DC

## 2018-09-25 NOTE — Progress Notes (Signed)
Report called to Thayer Ohm, RN (1C). Patient transported to room 107 on O2 via hospital bed by [facility], RN.

## 2018-09-25 NOTE — Progress Notes (Signed)
[employer] [facility] [place] at [facility]   PATIENT NAME: Caitlyn Evans    MR#:  [id number]  DATE OF BIRTH:  1965-10-11  SUBJECTIVE:  CHIEF COMPLAINT:   Chief Complaint  Patient presents with  . Shortness of Breath    The patient has no complaints, on oxygen by nasal cannula 3 L, off BiPAP.  She wants to go home. REVIEW OF SYSTEMS:  Review of Systems  Constitutional: Negative for chills and fever.  HENT: Negative for hearing loss and tinnitus.   Eyes: Negative for blurred vision and double vision.  Respiratory: Negative for cough and shortness of breath.   Cardiovascular: Negative for chest pain and palpitations.  Gastrointestinal: Negative for heartburn, nausea and vomiting.  Genitourinary: Negative for dysuria and urgency.  Musculoskeletal: Positive for falls.  Skin: Negative for itching and rash.  Neurological: Negative for dizziness and headaches.  Psychiatric/Behavioral: Negative for depression and hallucinations.    DRUG ALLERGIES:   Allergies  Allergen Reactions  . Codeine Hives, Nausea And Vomiting and Nausea Only  . Penicillins Hives and Other (See Comments)    Has patient had a PCN reaction causing immediate rash, facial/tongue/throat swelling, SOB or lightheadedness with hypotension: No Has patient had a PCN reaction causing severe rash involving mucus membranes or skin necrosis: No Has patient had a PCN reaction that required hospitalization No Has patient had a PCN reaction occurring within the last 10 years: No If all of the above answers are "NO", then may proceed with Cephalosporin use. Other reaction(s): Other (See Comments) Has patient had a PCN   VITALS:  Blood pressure (!) 117/99, pulse 93, temperature (!) 97.4 F (36.3 C), temperature source Axillary, resp. rate (!) 21, height 4\' 10"  (1.473 m), weight 55.2 kg, SpO2 96 %. PHYSICAL EXAMINATION:   Physical Exam  Constitutional: She is oriented to person, place, and time. She appears  well-developed and well-nourished. No distress.  HENT:  Head: Normocephalic and atraumatic.  Eyes: Pupils are equal, round, and reactive to light. Conjunctivae and EOM are normal.  Neck: Normal range of motion. Neck supple. No JVD present.  Cardiovascular: Normal rate, regular rhythm and normal heart sounds.  No murmur heard. Respiratory: Effort normal and breath sounds normal. No stridor. No respiratory distress. She has no wheezes. She has no rales.  GI: Soft. Bowel sounds are normal. She exhibits no distension. There is no abdominal tenderness.  Musculoskeletal: Normal range of motion.        General: No edema.  Neurological: She is alert and oriented to person, place, and time. No cranial nerve deficit.  Skin: Skin is warm.  Bruises on both legs and arms  Psychiatric: She has a normal mood and affect.   LABORATORY PANEL:  Female CBC Recent Labs  Lab 09/25/18 0248  WBC 24.0*  HGB 10.1*  HCT 34.5*  PLT 270   ------------------------------------------------------------------------------------------------------------------ Chemistries  Recent Labs  Lab 09/25/18 0248  NA 141  K 4.0  CL 100  CO2 34*  GLUCOSE 122*  BUN 29*  CREATININE 0.62  CALCIUM 8.9  MG 2.4   RADIOLOGY:  No results found. ASSESSMENT AND PLAN:   1. Acute on chronic hypoxic respiratory failure secondary to COPD exacerbation and multifocal right-sided pneumonia confirmed on CT chest Continue IV antibiotics, tapered IV Solu-Medrol.  Change to oral prednisone tomorrow.  Nebulizer treatments. Supplemental oxygen. 2D echocardiogram noted to have been done with ejection fraction of 55 to 60%.  Patient had multiple recurrent admissions with similar complaints. Patient  disease 3 L of oxygen at home.  BiPAP as needed.  Sepsis secondary to bilateral pneumonia (suspected aspiration).  Present on admission. She was on IV fluids previously which was discontinued due to underlying CHF.  Leukocytosis likely  related to ongoing steroid treatment Continue clindamycin per Dr. Jayme Cloud.  2. Chronic back pain.  Pain medications as needed  3. Acute on chronic diastolic congestive heart failure.  LV EF: 55% -   65%. Change to p.o. Lasix.  Deconditioning and generalized weakness. PT.  FULL CODE-at patient/husband's request Management plans discussed with the patient, family and they are in agreement.  CODE STATUS: Full Code  TOTAL TIME TAKING CARE OF THIS PATIENT: 32 minutes.   More than 50% of the time was spent in counseling/coordination of care: YES  POSSIBLE D/C IN 2 DAYS, DEPENDING ON CLINICAL CONDITION.   Shaune Pollack M.D on 09/25/2018 at 4:22 PM  Between 7am to 6pm - Pager - [phone]  After 6pm go to [email or web address] - Social research officer, government  [facility]  Office  [phone]  CC: Primary care physician; Corky Downs, MD  Note: This dictation was prepared with Dragon dictation along with smaller phrase technology. Any transcriptional errors that result from this process are unintentional.

## 2018-09-25 NOTE — Progress Notes (Signed)
Follow up - Critical Care Medicine Note  Patient Details:    Caitlyn Evans is an 53 y.o. female. 53 year old former smoker with very severe COPD and a history of recurrent BOOP (COP) with resultant fibrosis.  She has multiple admissions to [facility] for COPD exacerbations.  Presented with an exacerbation and multifocal pneumonia suggestive of aspiration I suspect due to medication misadventure.  The patient has chronic pain issues including recent vertebral compression fractures status post kyphoplasty.  The patient has a propensity for falls at home. Admitted to stepdown due to need for BiPAP.  Lines, Airways, Drains:    Anti-infectives:  Anti-infectives (From admission, onward)   Start     Dose/Rate Route Frequency Ordered Stop   09/23/18 1400  clindamycin (CLEOCIN) IVPB 600 mg     600 mg 100 mL/hr over 30 Minutes Intravenous Every 8 hours 09/23/18 1052     09/23/18 0900  vancomycin (VANCOCIN) IVPB 1000 mg/200 mL premix  Status:  Discontinued     1,000 mg 200 mL/hr over 60 Minutes Intravenous Every 24 hours 09/22/18 2343 09/23/18 1052   09/23/18 0400  ceFEPIme (MAXIPIME) 2 g in sodium chloride 0.9 % 100 mL IVPB  Status:  Discontinued     2 g 200 mL/hr over 30 Minutes Intravenous Every 8 hours 09/22/18 2221 09/23/18 1052   09/22/18 1930  vancomycin (VANCOCIN) IVPB 1000 mg/200 mL premix     1,000 mg 200 mL/hr over 60 Minutes Intravenous  Once 09/22/18 1921 09/22/18 2243   09/22/18 1930  ceFEPIme (MAXIPIME) 2 g in sodium chloride 0.9 % 100 mL IVPB     2 g 200 mL/hr over 30 Minutes Intravenous  Once 09/22/18 1921 09/22/18 2104      Microbiology: Results for orders placed or performed during the hospital encounter of 09/22/18  Blood Culture (routine x 2)     Status: Abnormal (Preliminary result)   Collection Time: 09/22/18  8:08 PM  Result Value Ref Range Status   Specimen Description   Final    BLOOD RIGHT ANTECUBITAL Performed at [facility], [address]  [place], [place] [address]    Special Requests   Final    BOTTLES DRAWN AEROBIC AND ANAEROBIC Blood Culture adequate volume Performed at [facility], [address] [place], [place] [address]    Culture  Setup Time   Final    GRAM POSITIVE COCCI AEROBIC BOTTLE ONLY CRITICAL RESULT CALLED TO, READ BACK BY AND VERIFIED WITH: MATT MCBANE AT 0531 09/24/18 SDR    Culture (A)  Final    STAPHYLOCOCCUS SPECIES (COAGULASE NEGATIVE) THE SIGNIFICANCE OF ISOLATING THIS ORGANISM FROM A SINGLE SET OF BLOOD CULTURES WHEN MULTIPLE SETS ARE DRAWN IS UNCERTAIN. PLEASE NOTIFY THE MICROBIOLOGY DEPARTMENT WITHIN ONE WEEK IF SPECIATION AND SENSITIVITIES ARE REQUIRED. Performed at [facility], [address] [place], [place] [address]    Report Status PENDING  Incomplete  Blood Culture ID Panel (Reflexed)     Status: Abnormal   Collection Time: 09/22/18  8:08 PM  Result Value Ref Range Status   Enterococcus species NOT DETECTED NOT DETECTED Final   Listeria monocytogenes NOT DETECTED NOT DETECTED Final   Staphylococcus species DETECTED (A) NOT DETECTED Final    Comment: Methicillin (oxacillin) susceptible coagulase negative staphylococcus. Possible blood culture contaminant (unless isolated from more than one blood culture draw or clinical case suggests pathogenicity). No antibiotic treatment is indicated for blood  culture contaminants. CRITICAL RESULT CALLED TO, READ BACK BY AND VERIFIED  WITH:  MATT MCBANE AT 9147 09/24/18 SDR    Staphylococcus aureus (BCID) NOT DETECTED NOT DETECTED Final   Methicillin resistance NOT DETECTED NOT DETECTED Final   Streptococcus species NOT DETECTED NOT DETECTED Final   Streptococcus agalactiae NOT DETECTED NOT DETECTED Final   Streptococcus pneumoniae NOT DETECTED NOT DETECTED Final   Streptococcus pyogenes NOT DETECTED NOT DETECTED Final   Acinetobacter baumannii NOT DETECTED NOT DETECTED Final   Enterobacteriaceae species NOT DETECTED NOT DETECTED Final    Enterobacter cloacae complex NOT DETECTED NOT DETECTED Final   Escherichia coli NOT DETECTED NOT DETECTED Final   Klebsiella oxytoca NOT DETECTED NOT DETECTED Final   Klebsiella pneumoniae NOT DETECTED NOT DETECTED Final   Proteus species NOT DETECTED NOT DETECTED Final   Serratia marcescens NOT DETECTED NOT DETECTED Final   Haemophilus influenzae NOT DETECTED NOT DETECTED Final   Neisseria meningitidis NOT DETECTED NOT DETECTED Final   Pseudomonas aeruginosa NOT DETECTED NOT DETECTED Final   Candida albicans NOT DETECTED NOT DETECTED Final   Candida glabrata NOT DETECTED NOT DETECTED Final   Candida krusei NOT DETECTED NOT DETECTED Final   Candida parapsilosis NOT DETECTED NOT DETECTED Final   Candida tropicalis NOT DETECTED NOT DETECTED Final    Comment: Performed at [facility], [address] [place], [place] [address]  MRSA PCR Screening     Status: None   Collection Time: 09/23/18  1:25 AM  Result Value Ref Range Status   MRSA by PCR NEGATIVE NEGATIVE Final    Comment:        The GeneXpert MRSA Assay (FDA approved for NASAL specimens only), is one component of a comprehensive MRSA colonization surveillance program. It is not intended to diagnose MRSA infection nor to guide or monitor treatment for MRSA infections. Performed at [facility], [address] [place], [place] [address]   Blood Culture (routine x 2)     Status: None (Preliminary result)   Collection Time: 09/23/18  1:41 AM  Result Value Ref Range Status   Specimen Description BLOOD LEFT ARM  Final   Special Requests   Final    BOTTLES DRAWN AEROBIC AND ANAEROBIC Blood Culture adequate volume   Culture   Final    NO GROWTH 2 DAYS Performed at [facility], [address] [place], [place] [address]    Report Status PENDING  Incomplete    Best Practice/Protocols:  VTE Prophylaxis: Lovenox (prophylaxtic dose)   Events:   Studies: Dg Chest 1 View  Result Date:  09/22/2018 CLINICAL DATA:  COPD with worsening dyspnea. Audible wheeze. EXAM: CHEST  1 VIEW COMPARISON:  09/13/2018 CXR, 04/25/2018 FINDINGS: Top-normal heart size. Mild aortic atherosclerosis at the arch without aneurysmal dilatation. Evidence of prior T6 kyphoplasty with cement in place. Central vascular congestion superimposed on emphysematous lungs are identified. New ovoid opacity in the right upper lobe measuring 19 x 10 mm is noted not apparent on prior chest CT or CXR. Repeat chest CT may help for better evaluation and to exclude a pulmonary nodule. Pleural thickening or plaque, or fluid-filled bulla are some other possibilities. Biapical pleuroparenchymal scarring and thickening is redemonstrated. IMPRESSION: 1. New ovoid opacity in the right upper lobe measuring 19 x 10 mm. Repeat chest CT may help for better evaluation and to exclude a pulmonary nodule. 2. Central vascular congestion superimposed on emphysema. Electronically Signed   By: Tollie Eth M.D.   On: 09/22/2018 18:59   Dg Thoracic Spine 2 View  Result Date: 09/17/2018  CLINICAL DATA:  T6 kyphoplasty. EXAM: THORACIC SPINE 2 VIEWS; DG C-ARM 61-120 MIN COMPARISON:  Thoracic spine MRI 09/14/2018 FLUOROSCOPY TIME:  Fluoroscopy Time:  2 minutes 13 seconds Radiation Exposure Index (if provided by the fluoroscopic device): 20.6 mGy FINDINGS: Two intraoperative fluoroscopic images of the thoracic spine are provided. Confirmation of spinal numbering is limited by the small field-of-view, however there has been interval augmentation of a compression fracture presumably corresponding to the T6 fracture shown on the recent MRI. There is bilateral spread of methylmethacrylate within the vertebral body without evidence of significant extravasation. IMPRESSION: Intraoperative images during T6 kyphoplasty. Electronically Signed   By: Sebastian AcheAllen  Grady M.D.   On: 09/17/2018 09:59   Dg Thoracic Spine 2 View  Result Date: 09/13/2018 CLINICAL DATA:  Patient reports  thoracic back pain without injury, onset today. Denies any previous injury to thoracic spine. EXAM: THORACIC SPINE 2 VIEWS COMPARISON:  09/13/2018 and chest CT on 04/25/2018 FINDINGS: There is 30% wedge compression fracture T6, new since August 2019 but indeterminate age. There is a superior endplate fracture of T12, with 10% loss of anterior height, also new since prior study but of indeterminate age. Posterior ribs are unremarkable. IMPRESSION: Compression fractures of T6 and T12, age indeterminate but new since August 2019. Electronically Signed   By: Norva PavlovElizabeth  Brown M.D.   On: 09/13/2018 17:33   Ct Chest Wo Contrast  Result Date: 09/23/2018 CLINICAL DATA:  Acute resp illness, > 55 years old. Increasing shortness of breath. Recent kyphoplasty EXAM: CT CHEST WITHOUT CONTRAST TECHNIQUE: Multidetector CT imaging of the chest was performed following the standard protocol without IV contrast. COMPARISON:  Radiographs yesterday. Chest CT 04/25/2018 FINDINGS: Cardiovascular: The heart is normal in size. Thoracic aorta is normal in caliber with mild atherosclerosis. No pericardial effusion. Mediastinum/Nodes: Small mediastinal lymph nodes not enlarged by size criteria. Slight right hilar prominence, limited assessment for adenopathy given lack of IV contrast. The esophagus is decompressed. Lungs/Pleura: Multifocal nodular and ground-glass opacities throughout the right upper, lower, and middle lobes, greatest in the perihilar right middle lobe. Majority of these opacities are 1-2 cm. Right greater than left bronchial thickening, with scattered areas of mucous plugging. Chain suture again seen in the left upper lobe. No acute left lung airspace disease. Mild to moderate emphysema. No evidence pulmonary edema. No pleural effusion. Upper Abdomen: Small hiatal hernia. No acute findings. Musculoskeletal: Kyphoplasty material within T6 vertebral body. No evidence of extravasated cement beyond the vertebral body. Mild T12  compression fracture as seen on prior imaging. Remote sternal fracture. IMPRESSION: 1. Multifocal nodular and ground-glass opacities throughout the right lung, greatest in the perihilar right middle lobe. Findings her most consistent with multifocal pneumonia. Recommend continued radiographic follow-up. 2. Mild to moderate emphysema and bronchial thickening. Aortic Atherosclerosis (ICD10-I70.0) and Emphysema (ICD10-J43.9). Electronically Signed   By: Narda RutherfordMelanie  Sanford M.D.   On: 09/23/2018 01:43   Mr Thoracic Spine Wo Contrast  Result Date: 09/14/2018 CLINICAL DATA:  Severe back pain. Compression fracture of the thoracic spine. EXAM: MRI THORACIC SPINE WITHOUT CONTRAST TECHNIQUE: Multiplanar, multisequence MR imaging of the thoracic spine was performed. No intravenous contrast was administered. COMPARISON:  Radiographs dated 09/13/2018 and CT scan of the chest dated 04/25/2018 FINDINGS: Alignment:  There is slight accentuation of the thoracic kyphosis. Vertebrae: There is a new benign-appearing compression fracture of T6. Slight protrusion of the posterior margin of T6 into the spinal canal. There is a mild old slight compression fracture and Schmorl's node in the superior endplate of  T12, but new since the CT scan of 04/25/2018. Cord:  Normal signal and morphology. Paraspinal and other soft tissues: Negative. Disc levels: C7-T1 through T4-5: Negative. T5-6: Benign-appearing compression fracture of the superior endplate of T6 extending into the base of both pedicles. Slight protrusion of the posterior margin of T6 into the spinal canal without neural impingement. No foraminal stenosis. T7-8 through T10-11: Negative. T11-12: Healed mild compression fracture and Schmorl's node in the superior endplate of T12 with slight protrusion of the posterosuperior aspect of T12 into the spinal canal without neural impingement. No spinal or foraminal stenosis. T12-L1 and L1-2: Negative. IMPRESSION: 1. New benign-appearing  compression fracture of T6 without neural impingement. 2. Old healed compression fracture of the superior endplate of T12 without neural impingement. Electronically Signed   By: Francene Boyers M.D.   On: 09/14/2018 17:03   Dg Chest Port 1 View  Result Date: 09/13/2018 CLINICAL DATA:  Respiratory distress EXAM: PORTABLE CHEST 1 VIEW COMPARISON:  Chest radiograph 09/10/2018 FINDINGS: Monitoring leads overlie the patient. Stable cardiac and mediastinal contours. No consolidative pulmonary opacities. No pleural effusion or pneumothorax. IMPRESSION: No acute cardiopulmonary process. Electronically Signed   By: Annia Belt M.D.   On: 09/13/2018 09:40   Dg Chest Port 1 View  Result Date: 09/10/2018 CLINICAL DATA:  Acute onset of shortness of breath. EXAM: PORTABLE CHEST 1 VIEW COMPARISON:  Chest radiograph 09/09/2018 FINDINGS: The lungs are well-aerated and clear. There is no evidence of focal opacification, pleural effusion or pneumothorax. The lung apices are partially obscured by the patient's head. The cardiomediastinal silhouette is within normal limits. No acute osseous abnormalities are seen. IMPRESSION: No acute cardiopulmonary process seen. Electronically Signed   By: Roanna Raider M.D.   On: 09/10/2018 05:01   Dg Chest Portable 1 View  Result Date: 09/09/2018 CLINICAL DATA:  Stage IV COPD with dyspnea EXAM: PORTABLE CHEST 1 VIEW COMPARISON:  08/31/2018 FINDINGS: Mild pulmonary hyperinflation. Normal heart size and mediastinal contours with minimal aortic atherosclerosis at the arch. Vascular calcifications project over the right lung apex as well. No thoracic aortic aneurysm. No effusion nor pneumothorax. No acute osseous abnormality. IMPRESSION: Mild pulmonary hyperinflation without active pulmonary disease. Electronically Signed   By: Tollie Eth M.D.   On: 09/09/2018 17:14   Dg Chest Portable 1 View  Result Date: 08/31/2018 CLINICAL DATA:  Respiratory distress. EXAM: PORTABLE CHEST 1 VIEW  COMPARISON:  Radiographs of April 25, 2018. FINDINGS: The heart size and mediastinal contours are within normal limits. No pneumothorax or pleural effusion is noted. Stable right apical scarring is noted. No acute pulmonary disease is noted. The visualized skeletal structures are unremarkable. IMPRESSION: No acute cardiopulmonary abnormality seen. Electronically Signed   By: Lupita Raider, M.D.   On: 08/31/2018 12:12   Dg C-arm 1-60 Min  Result Date: 09/17/2018 CLINICAL DATA:  T6 kyphoplasty. EXAM: THORACIC SPINE 2 VIEWS; DG C-ARM 61-120 MIN COMPARISON:  Thoracic spine MRI 09/14/2018 FLUOROSCOPY TIME:  Fluoroscopy Time:  2 minutes 13 seconds Radiation Exposure Index (if provided by the fluoroscopic device): 20.6 mGy FINDINGS: Two intraoperative fluoroscopic images of the thoracic spine are provided. Confirmation of spinal numbering is limited by the small field-of-view, however there has been interval augmentation of a compression fracture presumably corresponding to the T6 fracture shown on the recent MRI. There is bilateral spread of methylmethacrylate within the vertebral body without evidence of significant extravasation. IMPRESSION: Intraoperative images during T6 kyphoplasty. Electronically Signed   By: Jolaine Click.D.  On: 09/17/2018 09:59    Consults:    Subjective:    Overnight Issues:  No overt respiratory distress.  Off of BiPAP.  Back pain better controlled.  Wants to go home. Objective:  Vital signs for last 24 hours: Temp:  [96.6 F (35.9 C)-98.3 F (36.8 C)] 97.4 F (36.3 C) (01/10 0800) Pulse Rate:  [62-93] 93 (01/10 1000) Resp:  [11-36] 21 (01/10 1500) BP: (94-133)/(55-101) 117/99 (01/10 1500) SpO2:  [93 %-100 %] 96 % (01/10 1000) FiO2 (%):  [30 %] 30 % (01/10 0415) Weight:  [55.2 kg] 55.2 kg (01/10 0500)  Hemodynamic parameters for last 24 hours:    Intake/Output from previous day: 01/09 0701 - 01/10 0700 In: 384.1 [P.O.:240; I.V.:3; IV Piggyback:141.1] Out: -    Intake/Output this shift: Total I/O In: 120 [P.O.:120] Out: 1000 [Urine:1000]  Vent settings for last 24 hours: FiO2 (%):  [30 %] 30 %  Physical Exam:  General: Chronically ill appearing female, appears much older than stated age, sitting in bed, on nasal cannula O2, uses accessories of respiration in a chronic fashion.   Neuro:  Awake, A&O x4, no focal deficits, speech clear, more emotionally stable today.  Less anxious. HEENT:  Atraumatic, normocephalic, neck supple, no JVD Cardiovascular:  RRR, s1s2, no M/R/G, 2+ pulses throughout Lungs:   No wheezing, good air entry bilaterally, coarse breath sounds throughout. Abdomen:  Soft, protuberant, nontender, nondistended Musculoskeletal:  Muscle wasting, lidocaine patch @ thoracic area, no edema Skin: Warm/dry.  Ecchymoses noted to bilateral LE.  No obvious rashes, lesions, or ulcerations   Assessment/Plan:   1.  Acute on chronic hypoxic respiratory failure due to COPD exacerbation: She does have bilateral pneumonia, no organism specified.  I suspect that this is likely due to aspiration.  Continue clindamycin for potential aspiration.  She has been noted to have issues with medication misadventure and become somnolent when using excessive doses of pain medication.  I suspect she may have aspirated during 1 of these episodes.  Leukocytosis is improving.  Procalcitonin remains less than 0.1.  2.  Very severe COPD with history of recurrent BOOP (now COP) with attendant fibrosis: The patient has a history of multiple intubations in the past.  Continue nebulization treatments, decrease steroids as she is becoming anxious this may be due to mild steroid psychosis.  Switch to p.o. steroids with taper.  3.  Diastolic dysfunction of the left ventricle:  Responded well to diuretics yesterday.  Continue to monitor for decompensation.   4.  Anemia of chronic disease: Without signs of bleeding, monitor.  5.  Recent compression fractures of the  thoracic spine: Status post kyphoplasty.  Back pain improved, this had re-exacerbated after recent fall however no evidence of further compression fractures noted.  6.  Deconditioning, severe: Recommend skilled nursing facility for rehabilitation however unsure if the patient will go along with this.  Patient has very severe chronic obstructive pulmonary disease.  If care is following.  She seems to be conflicted with what her goals of care are to be but for now wants to be full code.    LOS: 3 days   Additional comments:Multidisciplinary rounds were performed with the ICU team.  The patient is stable for transfer to the regular medical floor.  We will sign off please reconsult as needed.   Josiah Lobo. Caitlyn Kiree Dejarnette,MD [facility]  09/25/2018

## 2018-09-26 LAB — BASIC METABOLIC PANEL
Anion gap: 7 (ref 5–15)
BUN: 26 mg/dL — ABNORMAL HIGH (ref 6–20)
CHLORIDE: 98 mmol/L (ref 98–111)
CO2: 35 mmol/L — ABNORMAL HIGH (ref 22–32)
Calcium: 8.8 mg/dL — ABNORMAL LOW (ref 8.9–10.3)
Creatinine, Ser: 0.53 mg/dL (ref 0.44–1.00)
GFR calc Af Amer: >60 mL/min (ref >60)
GFR calc non Af Amer: >60 mL/min (ref >60)
Glucose, Bld: 105 mg/dL — ABNORMAL HIGH (ref 70–99)
Potassium: 3.7 mmol/L (ref 3.5–5.1)
Sodium: 140 mmol/L (ref 135–145)

## 2018-09-26 LAB — CBC
HEMATOCRIT: 37.4 % (ref 36.0–46.0)
HEMOGLOBIN: 11.2 g/dL — AB (ref 12.0–15.0)
MCH: 26.2 pg (ref 26.0–34.0)
MCHC: 29.9 g/dL — ABNORMAL LOW (ref 30.0–36.0)
MCV: 87.6 fL (ref 80.0–100.0)
Platelets: 299 10*3/uL (ref 150–400)
RBC: 4.27 MIL/uL (ref 3.87–5.11)
RDW: 17.2 % — ABNORMAL HIGH (ref 11.5–15.5)
WBC: 16.7 10*3/uL — ABNORMAL HIGH (ref 4.0–10.5)
nRBC: 0 % (ref 0.0–0.2)

## 2018-09-26 LAB — CULTURE, BLOOD (ROUTINE X 2): Special Requests: ADEQUATE

## 2018-09-26 MED ORDER — CLINDAMYCIN HCL 300 MG PO CAPS: 300.0000 mg | ORAL_CAPSULE | Freq: Three times a day (TID) | ORAL | 0 refills | Status: DC

## 2018-09-26 MED ORDER — GUAIFENESIN 100 MG/5ML PO SOLN: 5.0000 mL | ORAL | 0 refills | Status: DC | PRN

## 2018-09-26 MED ORDER — PREDNISONE 10 MG PO TABS: ORAL_TABLET | ORAL | 0 refills | Status: DC

## 2018-09-26 NOTE — Care Management Note (Signed)
Case Management Note  Patient Details  Name: Caitlyn Evans MRN: [id number] Date of Birth: 10-31-1965  Subjective/Objective:  Patient to be discharged per MD order. Orders in place for home health services. Patient continues to decline home health but now open to outpatient palliative. Has previously discussed this and prefers [facility]. Referral faxed. No dc needs.                   Action/Plan:   Expected Discharge Date:  09/26/18               Expected Discharge Plan:  Home w Home Health Services  In-House Referral:     Discharge planning Services  CM Consult  Post Acute Care Choice:    Choice offered to:     DME Arranged:    DME Agency:     HH Arranged:  Patient Refused HH HH Agency:     Status of Service:  Completed, signed off  If discussed at [employer] of Stay Meetings, dates discussed:    Additional Comments:  Virgel Manifold, RN 09/26/2018, 9:40 AM

## 2018-09-26 NOTE — Discharge Summary (Signed)
[facility] [place] [facility]   PATIENT NAME: Caitlyn Evans    MR#:  [id number]  DATE OF BIRTH:  01/30/1966  DATE OF ADMISSION:  09/22/2018   ADMITTING PHYSICIAN: Milagros LollSrikar Sudini, MD  DATE OF DISCHARGE: 09/26/2018 10:30 AM  PRIMARY CARE PHYSICIAN: Corky DownsMasoud, Javed, MD   ADMISSION DIAGNOSIS:  SOB (shortness of breath) [R06.02] COPD exacerbation (HCC) [J44.1] HCAP (healthcare-associated pneumonia) [J18.9] Congestive heart failure, unspecified HF chronicity, unspecified heart failure type (HCC) [I50.9] DISCHARGE DIAGNOSIS:  Active Problems:   COPD exacerbation (HCC)  SECONDARY DIAGNOSIS:   Past Medical History:  Diagnosis Date  . Anxiety    panic attacks  . Asthma   . BOOP (bronchiolitis obliterans with organizing pneumonia) (HCC)   . CHF (congestive heart failure) (HCC)   . Dysphagia, pharyngoesophageal phase 05/30/2015  . Emphysema lung (HCC) 05/30/2015   on steroids and nebulizer for copd issues.  09/15/18  . GERD (gastroesophageal reflux disease)   . Hypercholesterolemia   . Hypertension   . Lung mass   . Migraines   . Motion sickness    all moving vehicles  . Myocardial infarction (HCC) 2012  . Respiratory failure with hypercapnia (HCC) 08/2018   was hospitalized and on bipap. steroids and nebulizer treatments helped  . Seasonal allergies   . Shortness of breath dyspnea    1 flight-stairs  . Sternal fracture    HOSPITAL COURSE:  1. Acute on chronic hypoxic respiratory failure secondary to COPD exacerbation and multifocal right-sided pneumonia confirmed on CT chest She is treated with IV antibiotics, tapered IV Solu-Medrol.  Changed to oral prednisone tomorrow.  Nebulizer treatments. Supplemental oxygen. 2D echocardiogram noted to have been done with ejection fraction of 55 to 60%.  Patient had multiple recurrent admissions with similar complaints. Patient is on 3 L of oxygen at home.  BiPAP as needed.  Sepsis secondary to bilateral  pneumonia (suspected aspiration).  Present on admission. She was on IV fluids previously which was discontinued due to underlying CHF.  Leukocytosis likely related to ongoing steroid treatment Continue clindamycin per Dr. Jayme CloudGonzalez.  2. Chronic back pain. Pain medications as needed  3. Acute on chronic diastolic congestive heart failure. LV EF: 55% - 65%. Changed to p.o. Lasix.  Deconditioning and generalized weakness. Home health and PT. DISCHARGE CONDITIONS:  Stable, discharged to home with home health and PT today. CONSULTS OBTAINED:   DRUG ALLERGIES:   Allergies  Allergen Reactions  . Codeine Hives, Nausea And Vomiting and Nausea Only  . Penicillins Hives and Other (See Comments)    Has patient had a PCN reaction causing immediate rash, facial/tongue/throat swelling, SOB or lightheadedness with hypotension: No Has patient had a PCN reaction causing severe rash involving mucus membranes or skin necrosis: No Has patient had a PCN reaction that required hospitalization No Has patient had a PCN reaction occurring within the last 10 years: No If all of the above answers are "NO", then may proceed with Cephalosporin use. Other reaction(s): Other (See Comments) Has patient had a PCN   DISCHARGE MEDICATIONS:   Allergies as of 09/26/2018      Reactions   Codeine Hives, Nausea And Vomiting, Nausea Only   Penicillins Hives, Other (See Comments)   Has patient had a PCN reaction causing immediate rash, facial/tongue/throat swelling, SOB or lightheadedness with hypotension: No Has patient had a PCN reaction causing severe rash involving mucus membranes or skin necrosis: No Has patient had a PCN reaction that required hospitalization No Has patient had  a PCN reaction occurring within the last 10 years: No If all of the above answers are "NO", then may proceed with Cephalosporin use. Other reaction(s): Other (See Comments) Has patient had a PCN      Medication List    TAKE  these medications   ALPRAZolam 0.5 MG tablet Commonly known as:  XANAX Take 0.5 mg by mouth 3 (three) times daily.   clindamycin 300 MG capsule Commonly known as:  CLEOCIN Take 1 capsule (300 mg total) by mouth 3 (three) times daily for 5 days.   diltiazem 180 MG 24 hr capsule Commonly known as:  CARDIZEM CD Take 1 capsule (180 mg total) by mouth daily.   donepezil 10 MG tablet Commonly known as:  ARICEPT Take 10 mg by mouth daily.   furosemide 20 MG tablet Commonly known as:  LASIX Take 1 tablet (20 mg total) by mouth daily.   guaiFENesin 100 MG/5ML Soln Commonly known as:  ROBITUSSIN Take 5 mLs (100 mg total) by mouth every 4 (four) hours as needed for cough or to loosen phlegm.   ipratropium-albuterol 0.5-2.5 (3) MG/3ML Soln Commonly known as:  DUONEB Inhale 3 mLs into the lungs every 4 (four) hours as needed (for wheezing/shortness of breath). What changed:    reasons to take this  additional instructions   metoprolol tartrate 25 MG tablet Commonly known as:  LOPRESSOR Take 1 tablet (25 mg total) by mouth 2 (two) times daily.   oxyCODONE-acetaminophen 5-325 MG tablet Commonly known as:  PERCOCET Take 1 tablet by mouth every 6 (six) hours as needed for severe pain.   pantoprazole 40 MG tablet Commonly known as:  PROTONIX TAKE ONE TABLET BY MOUTH TWICE DAILY   predniSONE 10 MG tablet Commonly known as:  DELTASONE 30 mg po daily 1 day, 20 mg po daily 1 day, 10 mg po daily 1 day. What changed:    how much to take  how to take this  when to take this  additional instructions   rosuvastatin 10 MG tablet Commonly known as:  CRESTOR Take 10 mg by mouth daily.   tamsulosin 0.4 MG Caps capsule Commonly known as:  FLOMAX Take 1 capsule (0.4 mg total) by mouth daily.        DISCHARGE INSTRUCTIONS:  See AVS.  If you experience worsening of your admission symptoms, develop shortness of breath, life threatening emergency, suicidal or homicidal thoughts  you must seek medical attention immediately by calling 911 or calling your MD immediately  if symptoms less severe.  You Must read complete instructions/literature along with all the possible adverse reactions/side effects for all the Medicines you take and that have been prescribed to you. Take any new Medicines after you have completely understood and accpet all the possible adverse reactions/side effects.   Please note  You were cared for by a hospitalist during your hospital stay. If you have any questions about your discharge medications or the care you received while you were in the hospital after you are discharged, you can call the unit and asked to speak with the hospitalist on call if the hospitalist that took care of you is not available. Once you are discharged, your primary care physician will handle any further medical issues. Please note that NO REFILLS for any discharge medications will be authorized once you are discharged, as it is imperative that you return to your primary care physician (or establish a relationship with a primary care physician if you do not have one) for  your aftercare needs so that they can reassess your need for medications and monitor your lab values.    On the day of Discharge:  VITAL SIGNS:  Blood pressure 137/72, pulse 78, temperature 97.9 F (36.6 C), temperature source Oral, resp. rate 18, height 4\' 10"  (1.473 m), weight 56.4 kg, SpO2 93 %. PHYSICAL EXAMINATION:  GENERAL:  53 y.o.-year-old patient lying in the bed with no acute distress.  EYES: Pupils equal, round, reactive to light and accommodation. No scleral icterus. Extraocular muscles intact.  HEENT: Head atraumatic, normocephalic. Oropharynx and nasopharynx clear.  NECK:  Supple, no jugular venous distention. No thyroid enlargement, no tenderness.  LUNGS: Normal breath sounds bilaterally, no wheezing, rales,rhonchi or crepitation. No use of accessory muscles of respiration.  CARDIOVASCULAR: S1,  S2 normal. No murmurs, rubs, or gallops.  ABDOMEN: Soft, non-tender, non-distended. Bowel sounds present. No organomegaly or mass.  EXTREMITIES: No pedal edema, cyanosis, or clubbing.  NEUROLOGIC: Cranial nerves II through XII are intact. Muscle strength 5/5 in all extremities. Sensation intact. Gait not checked.  PSYCHIATRIC: The patient is alert and oriented x 3.  SKIN: No obvious rash, lesion, or ulcer.  DATA REVIEW:   CBC Recent Labs  Lab 09/26/18 0354  WBC 16.7*  HGB 11.2*  HCT 37.4  PLT 299    Chemistries  Recent Labs  Lab 09/25/18 0248 09/26/18 0354  NA 141 140  K 4.0 3.7  CL 100 98  CO2 34* 35*  GLUCOSE 122* 105*  BUN 29* 26*  CREATININE 0.62 0.53  CALCIUM 8.9 8.8*  MG 2.4  --      Microbiology Results  Results for orders placed or performed during the hospital encounter of 09/22/18  Blood Culture (routine x 2)     Status: Abnormal   Collection Time: 09/22/18  8:08 PM  Result Value Ref Range Status   Specimen Description   Final    BLOOD RIGHT ANTECUBITAL Performed at [facility], [address], [place], [place] [address]    Special Requests   Final    BOTTLES DRAWN AEROBIC AND ANAEROBIC Blood Culture adequate volume Performed at [facility], [address] [place], [place] [address]    Culture  Setup Time   Final    GRAM POSITIVE COCCI AEROBIC BOTTLE ONLY CRITICAL RESULT CALLED TO, READ BACK BY AND VERIFIED WITH: MATT MCBANE AT 0531 09/24/18 SDR    Culture (A)  Final    STAPHYLOCOCCUS SPECIES (COAGULASE NEGATIVE) THE SIGNIFICANCE OF ISOLATING THIS ORGANISM FROM A SINGLE SET OF BLOOD CULTURES WHEN MULTIPLE SETS ARE DRAWN IS UNCERTAIN. PLEASE NOTIFY THE MICROBIOLOGY DEPARTMENT WITHIN ONE WEEK IF SPECIATION AND SENSITIVITIES ARE REQUIRED. Performed at [facility], [address], [place], [place] [address]    Report Status 09/26/2018 FINAL  Final  Blood Culture ID Panel (Reflexed)     Status: Abnormal   Collection Time:  09/22/18  8:08 PM  Result Value Ref Range Status   Enterococcus species NOT DETECTED NOT DETECTED Final   Listeria monocytogenes NOT DETECTED NOT DETECTED Final   Staphylococcus species DETECTED (A) NOT DETECTED Final    Comment: Methicillin (oxacillin) susceptible coagulase negative staphylococcus. Possible blood culture contaminant (unless isolated from more than one blood culture draw or clinical case suggests pathogenicity). No antibiotic treatment is indicated for blood  culture contaminants. CRITICAL RESULT CALLED TO, READ BACK BY AND VERIFIED WITH:  MATT MCBANE AT 2130 09/24/18 SDR    Staphylococcus aureus (BCID) NOT DETECTED NOT DETECTED Final  Methicillin resistance NOT DETECTED NOT DETECTED Final   Streptococcus species NOT DETECTED NOT DETECTED Final   Streptococcus agalactiae NOT DETECTED NOT DETECTED Final   Streptococcus pneumoniae NOT DETECTED NOT DETECTED Final   Streptococcus pyogenes NOT DETECTED NOT DETECTED Final   Acinetobacter baumannii NOT DETECTED NOT DETECTED Final   Enterobacteriaceae species NOT DETECTED NOT DETECTED Final   Enterobacter cloacae complex NOT DETECTED NOT DETECTED Final   Escherichia coli NOT DETECTED NOT DETECTED Final   Klebsiella oxytoca NOT DETECTED NOT DETECTED Final   Klebsiella pneumoniae NOT DETECTED NOT DETECTED Final   Proteus species NOT DETECTED NOT DETECTED Final   Serratia marcescens NOT DETECTED NOT DETECTED Final   Haemophilus influenzae NOT DETECTED NOT DETECTED Final   Neisseria meningitidis NOT DETECTED NOT DETECTED Final   Pseudomonas aeruginosa NOT DETECTED NOT DETECTED Final   Candida albicans NOT DETECTED NOT DETECTED Final   Candida glabrata NOT DETECTED NOT DETECTED Final   Candida krusei NOT DETECTED NOT DETECTED Final   Candida parapsilosis NOT DETECTED NOT DETECTED Final   Candida tropicalis NOT DETECTED NOT DETECTED Final    Comment: Performed at [facility], [address] [place], [place] [address]    MRSA PCR Screening     Status: None   Collection Time: 09/23/18  1:25 AM  Result Value Ref Range Status   MRSA by PCR NEGATIVE NEGATIVE Final    Comment:        The GeneXpert MRSA Assay (FDA approved for NASAL specimens only), is one component of a comprehensive MRSA colonization surveillance program. It is not intended to diagnose MRSA infection nor to guide or monitor treatment for MRSA infections. Performed at [facility], [address] [place], [place] [address]   Blood Culture (routine x 2)     Status: None (Preliminary result)   Collection Time: 09/23/18  1:41 AM  Result Value Ref Range Status   Specimen Description BLOOD LEFT ARM  Final   Special Requests   Final    BOTTLES DRAWN AEROBIC AND ANAEROBIC Blood Culture adequate volume   Culture   Final    NO GROWTH 3 DAYS Performed at [facility], [address], [place], [place] [address]    Report Status PENDING  Incomplete    RADIOLOGY:  No results found.   Management plans discussed with the patient, family and they are in agreement.  CODE STATUS: Full Code   TOTAL TIME TAKING CARE OF THIS PATIENT: 33 minutes.    Shaune Pollack M.D on 09/26/2018 at 1:42 PM  Between 7am to 6pm - Pager - [phone]  After 6pm go to [email or web address] - Social research officer, government  [facility] [place] [facility]  Office  [phone]  CC: Primary care physician; Corky Downs, MD   Note: This dictation was prepared with Dragon dictation along with smaller phrase technology. Any transcriptional errors that result from this process are unintentional.

## 2018-09-26 NOTE — Progress Notes (Signed)
PT Cancellation Note  Patient Details Name: Caitlyn Evans MRN: [id number] DOB: [birth year]   Cancelled Treatment:    Reason Eval/Treat Not Completed: Other (comment).  Has declined any therapy due to being I for gait and planning palliative services now.   Ivar Drape 09/26/2018, 10:02 AM   Samul Dada, PT MS Acute Rehab Dept. Number: [facility] [id number] and [facility] [phone]

## 2018-09-26 NOTE — Discharge Instructions (Signed)
HHPT, palliative care at home. Fall precaution.

## 2018-09-28 ENCOUNTER — Other Ambulatory Visit: Payer: Self-pay

## 2018-09-28 ENCOUNTER — Telehealth: Payer: Self-pay

## 2018-09-28 DIAGNOSIS — J441 Chronic obstructive pulmonary disease with (acute) exacerbation: Secondary | ICD-10-CM

## 2018-09-28 LAB — CULTURE, BLOOD (ROUTINE X 2)
Culture: NO GROWTH
Special Requests: ADEQUATE

## 2018-09-28 NOTE — Telephone Encounter (Signed)
Palliative Medicine RN Note: Rec'd call from [place] asking if we were coming out to see her today, as well as reporting that she needs help. Chart review shows that she was referred to [facility] for home palliative care.  I called [facility]; they got the referral and will call her.  Margret Chance Raye Wiens, RN, BSN, [facility] Palliative Medicine Team 09/28/2018 9:44 AM Office [phone]

## 2018-09-29 ENCOUNTER — Other Ambulatory Visit: Payer: Self-pay

## 2018-09-29 DIAGNOSIS — I119 Hypertensive heart disease without heart failure: Secondary | ICD-10-CM | POA: Diagnosis not present

## 2018-09-29 DIAGNOSIS — J439 Emphysema, unspecified: Secondary | ICD-10-CM | POA: Diagnosis not present

## 2018-09-29 DIAGNOSIS — K222 Esophageal obstruction: Secondary | ICD-10-CM | POA: Diagnosis not present

## 2018-09-29 DIAGNOSIS — J418 Mixed simple and mucopurulent chronic bronchitis: Secondary | ICD-10-CM | POA: Diagnosis not present

## 2018-09-29 NOTE — Patient Outreach (Signed)
[facility]) Care Management  09/29/2018  Callia OVIYA CREE 1966/08/02 [id number]    EMMI-GENERAL DISCHARGE  RED ON EMMI ALERT Day # 1 Date: 09/28/18  10:00 AM Red Alert Reason: "Questions about discharge papers? Yes", "Unfilled prescriptions? Yes"  Outreach attempt # 1 unsuccessful.  No answer. RN CM left HIPAA compliant voicemail message along with contact info.   Plan: RN CM will make outreach attempt to patient within 3-4 business days. RN CM will send unsuccessful outreach letter to patient.   Delsa Sale, RN,CCM [facility] Care Management Care Management Coordinator Direct Phone: [phone] Toll Free: [phone] Fax: [phone]

## 2018-09-30 ENCOUNTER — Inpatient Hospital Stay
Admission: EM | Admit: 2018-09-30 | Discharge: 2018-10-05 | DRG: 190 | Disposition: A | Discharge status: Home / Self Care | Payer: Medicare Other | Attending: Internal Medicine | Admitting: Internal Medicine

## 2018-09-30 ENCOUNTER — Other Ambulatory Visit: Payer: Self-pay

## 2018-09-30 ENCOUNTER — Emergency Department: Payer: Medicare Other

## 2018-09-30 DIAGNOSIS — Z90721 Acquired absence of ovaries, unilateral: Secondary | ICD-10-CM

## 2018-09-30 DIAGNOSIS — J9622 Acute and chronic respiratory failure with hypercapnia: Secondary | ICD-10-CM | POA: Diagnosis present

## 2018-09-30 DIAGNOSIS — Z825 Family history of asthma and other chronic lower respiratory diseases: Secondary | ICD-10-CM | POA: Diagnosis not present

## 2018-09-30 DIAGNOSIS — J9621 Acute and chronic respiratory failure with hypoxia: Secondary | ICD-10-CM | POA: Diagnosis present

## 2018-09-30 DIAGNOSIS — R1310 Dysphagia, unspecified: Secondary | ICD-10-CM | POA: Diagnosis present

## 2018-09-30 DIAGNOSIS — R918 Other nonspecific abnormal finding of lung field: Secondary | ICD-10-CM | POA: Diagnosis not present

## 2018-09-30 DIAGNOSIS — E785 Hyperlipidemia, unspecified: Secondary | ICD-10-CM | POA: Diagnosis present

## 2018-09-30 DIAGNOSIS — I252 Old myocardial infarction: Secondary | ICD-10-CM | POA: Diagnosis not present

## 2018-09-30 DIAGNOSIS — W19XXXA Unspecified fall, initial encounter: Secondary | ICD-10-CM | POA: Diagnosis present

## 2018-09-30 DIAGNOSIS — K219 Gastro-esophageal reflux disease without esophagitis: Secondary | ICD-10-CM | POA: Diagnosis present

## 2018-09-30 DIAGNOSIS — S22058A Other fracture of T5-T6 vertebra, initial encounter for closed fracture: Secondary | ICD-10-CM | POA: Diagnosis present

## 2018-09-30 DIAGNOSIS — J9601 Acute respiratory failure with hypoxia: Secondary | ICD-10-CM | POA: Diagnosis present

## 2018-09-30 DIAGNOSIS — S22000A Wedge compression fracture of unspecified thoracic vertebra, initial encounter for closed fracture: Secondary | ICD-10-CM | POA: Diagnosis not present

## 2018-09-30 DIAGNOSIS — M546 Pain in thoracic spine: Secondary | ICD-10-CM | POA: Diagnosis not present

## 2018-09-30 DIAGNOSIS — F17210 Nicotine dependence, cigarettes, uncomplicated: Secondary | ICD-10-CM | POA: Diagnosis present

## 2018-09-30 DIAGNOSIS — I5032 Chronic diastolic (congestive) heart failure: Secondary | ICD-10-CM | POA: Diagnosis not present

## 2018-09-30 DIAGNOSIS — I2510 Atherosclerotic heart disease of native coronary artery without angina pectoris: Secondary | ICD-10-CM | POA: Diagnosis present

## 2018-09-30 DIAGNOSIS — J302 Other seasonal allergic rhinitis: Secondary | ICD-10-CM | POA: Diagnosis present

## 2018-09-30 DIAGNOSIS — Z8701 Personal history of pneumonia (recurrent): Secondary | ICD-10-CM | POA: Diagnosis not present

## 2018-09-30 DIAGNOSIS — I10 Essential (primary) hypertension: Secondary | ICD-10-CM | POA: Diagnosis not present

## 2018-09-30 DIAGNOSIS — Z880 Allergy status to penicillin: Secondary | ICD-10-CM

## 2018-09-30 DIAGNOSIS — Z79899 Other long term (current) drug therapy: Secondary | ICD-10-CM | POA: Diagnosis not present

## 2018-09-30 DIAGNOSIS — Z8249 Family history of ischemic heart disease and other diseases of the circulatory system: Secondary | ICD-10-CM | POA: Diagnosis not present

## 2018-09-30 DIAGNOSIS — I110 Hypertensive heart disease with heart failure: Secondary | ICD-10-CM | POA: Diagnosis present

## 2018-09-30 DIAGNOSIS — J441 Chronic obstructive pulmonary disease with (acute) exacerbation: Secondary | ICD-10-CM | POA: Diagnosis not present

## 2018-09-30 DIAGNOSIS — J9620 Acute and chronic respiratory failure, unspecified whether with hypoxia or hypercapnia: Secondary | ICD-10-CM

## 2018-09-30 DIAGNOSIS — Z9981 Dependence on supplemental oxygen: Secondary | ICD-10-CM

## 2018-09-30 DIAGNOSIS — R062 Wheezing: Secondary | ICD-10-CM | POA: Diagnosis not present

## 2018-09-30 DIAGNOSIS — F410 Panic disorder [episodic paroxysmal anxiety] without agoraphobia: Secondary | ICD-10-CM | POA: Diagnosis present

## 2018-09-30 DIAGNOSIS — Z885 Allergy status to narcotic agent status: Secondary | ICD-10-CM

## 2018-09-30 DIAGNOSIS — Z90710 Acquired absence of both cervix and uterus: Secondary | ICD-10-CM

## 2018-09-30 DIAGNOSIS — R0600 Dyspnea, unspecified: Secondary | ICD-10-CM

## 2018-09-30 DIAGNOSIS — S299XXA Unspecified injury of thorax, initial encounter: Secondary | ICD-10-CM | POA: Diagnosis not present

## 2018-09-30 DIAGNOSIS — S22050A Wedge compression fracture of T5-T6 vertebra, initial encounter for closed fracture: Secondary | ICD-10-CM | POA: Diagnosis not present

## 2018-09-30 DIAGNOSIS — R0602 Shortness of breath: Secondary | ICD-10-CM | POA: Diagnosis not present

## 2018-09-30 LAB — BLOOD GAS, VENOUS
Acid-Base Excess: 10.1 mmol/L — ABNORMAL HIGH (ref 0.0–2.0)
Bicarbonate: 37.5 mmol/L — ABNORMAL HIGH (ref 20.0–28.0)
Delivery systems: POSITIVE
FIO2: 0.35
O2 Saturation: 58.8 %
PATIENT TEMPERATURE: 37
pCO2, Ven: 62 mmHg — ABNORMAL HIGH (ref 44.0–60.0)
pH, Ven: 7.39 (ref 7.250–7.430)
pO2, Ven: 31 mmHg — CL (ref 32.0–45.0)

## 2018-09-30 LAB — CBC
HEMATOCRIT: 39.2 % (ref 36.0–46.0)
Hemoglobin: 12.1 g/dL (ref 12.0–15.0)
MCH: 26.6 pg (ref 26.0–34.0)
MCHC: 30.9 g/dL (ref 30.0–36.0)
MCV: 86.2 fL (ref 80.0–100.0)
NRBC: 0 % (ref 0.0–0.2)
Platelets: 354 10*3/uL (ref 150–400)
RBC: 4.55 MIL/uL (ref 3.87–5.11)
RDW: 17.7 % — ABNORMAL HIGH (ref 11.5–15.5)
WBC: 16.1 10*3/uL — ABNORMAL HIGH (ref 4.0–10.5)

## 2018-09-30 LAB — CG4 I-STAT (LACTIC ACID): Lactic Acid, Venous: 1.48 mmol/L (ref 0.5–1.9)

## 2018-09-30 LAB — BASIC METABOLIC PANEL
Anion gap: 10 (ref 5–15)
BUN: 16 mg/dL (ref 6–20)
CO2: 30 mmol/L (ref 22–32)
CREATININE: 0.53 mg/dL (ref 0.44–1.00)
Calcium: 8.5 mg/dL — ABNORMAL LOW (ref 8.9–10.3)
Chloride: 96 mmol/L — ABNORMAL LOW (ref 98–111)
GFR calc Af Amer: >60 mL/min (ref >60)
GFR calc non Af Amer: >60 mL/min (ref >60)
Glucose, Bld: 133 mg/dL — ABNORMAL HIGH (ref 70–99)
Potassium: 3.9 mmol/L (ref 3.5–5.1)
Sodium: 136 mmol/L (ref 135–145)

## 2018-09-30 LAB — GLUCOSE, CAPILLARY: Glucose-Capillary: 103 mg/dL — ABNORMAL HIGH (ref 70–99)

## 2018-09-30 LAB — INFLUENZA PANEL BY PCR (TYPE A & B)
Influenza A By PCR: NEGATIVE
Influenza B By PCR: NEGATIVE

## 2018-09-30 LAB — TROPONIN I: Troponin I: <0.03 ng/mL (ref <0.03)

## 2018-09-30 LAB — MRSA PCR SCREENING: MRSA by PCR: NEGATIVE

## 2018-09-30 MED ORDER — PANTOPRAZOLE SODIUM 40 MG PO TBEC
40.0000 mg | DELAYED_RELEASE_TABLET | Freq: Two times a day (BID) | ORAL | Status: DC
  Administered 2018-09-30 – 2018-10-05 (×10): 40 mg via ORAL
  Filled 2018-09-30 (×10): qty 1

## 2018-09-30 MED ORDER — TAMSULOSIN HCL 0.4 MG PO CAPS
0.4000 mg | ORAL_CAPSULE | Freq: Every day | ORAL | Status: DC
  Administered 2018-09-30 – 2018-10-05 (×6): 0.4 mg via ORAL
  Filled 2018-09-30 (×6): qty 1

## 2018-09-30 MED ORDER — FUROSEMIDE 20 MG PO TABS
20.0000 mg | ORAL_TABLET | Freq: Every day | ORAL | Status: DC
  Administered 2018-10-01 – 2018-10-04 (×4): 20 mg via ORAL
  Filled 2018-09-30 (×5): qty 1

## 2018-09-30 MED ORDER — OXYCODONE-ACETAMINOPHEN 5-325 MG PO TABS
1.0000 | ORAL_TABLET | Freq: Four times a day (QID) | ORAL | Status: DC | PRN
  Administered 2018-09-30 – 2018-10-04 (×11): 1 via ORAL
  Filled 2018-09-30 (×11): qty 1

## 2018-09-30 MED ORDER — ALPRAZOLAM 0.5 MG PO TABS
0.5000 mg | ORAL_TABLET | Freq: Three times a day (TID) | ORAL | Status: DC
  Administered 2018-09-30 – 2018-10-05 (×14): 0.5 mg via ORAL
  Filled 2018-09-30 (×14): qty 1

## 2018-09-30 MED ORDER — ACETAMINOPHEN 500 MG PO TABS
1000.0000 mg | ORAL_TABLET | Freq: Once | ORAL | Status: AC
  Administered 2018-09-30: 1000 mg via ORAL
  Filled 2018-09-30: qty 2

## 2018-09-30 MED ORDER — KETOROLAC TROMETHAMINE 15 MG/ML IJ SOLN
15.0000 mg | Freq: Three times a day (TID) | INTRAMUSCULAR | Status: AC | PRN
  Administered 2018-09-30 – 2018-10-01 (×2): 15 mg via INTRAVENOUS
  Filled 2018-09-30 (×3): qty 1

## 2018-09-30 MED ORDER — IOHEXOL 350 MG/ML SOLN
75.0000 mL | Freq: Once | INTRAVENOUS | Status: AC | PRN
  Administered 2018-09-30: 75 mL via INTRAVENOUS

## 2018-09-30 MED ORDER — IPRATROPIUM-ALBUTEROL 0.5-2.5 (3) MG/3ML IN SOLN
RESPIRATORY_TRACT | Status: AC
  Administered 2018-09-30: 18:00:00
  Filled 2018-09-30: qty 3

## 2018-09-30 MED ORDER — MORPHINE SULFATE (PF) 2 MG/ML IV SOLN
1.0000 mg | INTRAVENOUS | Status: DC | PRN
  Administered 2018-10-01 – 2018-10-05 (×19): 2 mg via INTRAVENOUS
  Filled 2018-09-30 (×19): qty 1

## 2018-09-30 MED ORDER — ALBUTEROL SULFATE (2.5 MG/3ML) 0.083% IN NEBU
2.5000 mg | INHALATION_SOLUTION | RESPIRATORY_TRACT | Status: DC | PRN
  Administered 2018-10-01 – 2018-10-05 (×3): 2.5 mg via RESPIRATORY_TRACT
  Filled 2018-09-30 (×3): qty 3

## 2018-09-30 MED ORDER — ROSUVASTATIN CALCIUM 10 MG PO TABS
10.0000 mg | ORAL_TABLET | Freq: Every day | ORAL | Status: DC
  Administered 2018-09-30 – 2018-10-05 (×6): 10 mg via ORAL
  Filled 2018-09-30 (×6): qty 1

## 2018-09-30 MED ORDER — IPRATROPIUM-ALBUTEROL 0.5-2.5 (3) MG/3ML IN SOLN
3.0000 mL | Freq: Once | RESPIRATORY_TRACT | Status: AC
  Administered 2018-09-30: 3 mL via RESPIRATORY_TRACT

## 2018-09-30 MED ORDER — FENTANYL CITRATE (PF) 100 MCG/2ML IJ SOLN
50.0000 ug | Freq: Once | INTRAMUSCULAR | Status: AC
  Administered 2018-09-30: 50 ug via INTRAVENOUS
  Filled 2018-09-30: qty 2

## 2018-09-30 MED ORDER — IPRATROPIUM-ALBUTEROL 0.5-2.5 (3) MG/3ML IN SOLN
3.0000 mL | RESPIRATORY_TRACT | Status: DC
  Administered 2018-09-30 – 2018-10-05 (×28): 3 mL via RESPIRATORY_TRACT
  Filled 2018-09-30 (×28): qty 3

## 2018-09-30 MED ORDER — LORAZEPAM 2 MG/ML IJ SOLN
0.5000 mg | Freq: Once | INTRAMUSCULAR | Status: AC
  Administered 2018-09-30: 0.5 mg via INTRAVENOUS
  Filled 2018-09-30: qty 1

## 2018-09-30 MED ORDER — METHYLPREDNISOLONE SODIUM SUCC 125 MG IJ SOLR
125.0000 mg | Freq: Once | INTRAMUSCULAR | Status: AC
  Administered 2018-09-30: 125 mg via INTRAVENOUS
  Filled 2018-09-30: qty 2

## 2018-09-30 MED ORDER — ONDANSETRON HCL 4 MG/2ML IJ SOLN
4.0000 mg | Freq: Once | INTRAMUSCULAR | Status: AC
  Administered 2018-09-30: 4 mg via INTRAVENOUS
  Filled 2018-09-30: qty 2

## 2018-09-30 MED ORDER — METHYLPREDNISOLONE SODIUM SUCC 125 MG IJ SOLR
60.0000 mg | Freq: Four times a day (QID) | INTRAMUSCULAR | Status: DC
  Administered 2018-09-30 – 2018-10-02 (×7): 60 mg via INTRAVENOUS
  Filled 2018-09-30 (×7): qty 2

## 2018-09-30 MED ORDER — GUAIFENESIN 100 MG/5ML PO SOLN
5.0000 mL | ORAL | Status: DC | PRN
  Administered 2018-10-02 – 2018-10-04 (×5): 100 mg via ORAL
  Filled 2018-09-30 (×7): qty 5

## 2018-09-30 MED ORDER — CLINDAMYCIN PHOSPHATE 300 MG/50ML IV SOLN
300.0000 mg | Freq: Two times a day (BID) | INTRAVENOUS | Status: AC
  Administered 2018-09-30 – 2018-10-02 (×5): 300 mg via INTRAVENOUS
  Filled 2018-09-30 (×6): qty 50

## 2018-09-30 MED ORDER — ENOXAPARIN SODIUM 40 MG/0.4ML ~~LOC~~ SOLN
40.0000 mg | SUBCUTANEOUS | Status: DC
  Filled 2018-09-30 (×4): qty 0.4

## 2018-09-30 MED ORDER — METHYLPREDNISOLONE SODIUM SUCC 125 MG IJ SOLR: 60.0000 mg | Freq: Three times a day (TID) | INTRAMUSCULAR | Status: DC

## 2018-09-30 MED ORDER — METOPROLOL TARTRATE 25 MG PO TABS
25.0000 mg | ORAL_TABLET | Freq: Two times a day (BID) | ORAL | Status: DC
  Administered 2018-09-30 – 2018-10-05 (×10): 25 mg via ORAL
  Filled 2018-09-30 (×10): qty 1

## 2018-09-30 MED ORDER — BUDESONIDE 0.5 MG/2ML IN SUSP
0.5000 mg | Freq: Two times a day (BID) | RESPIRATORY_TRACT | Status: DC
  Administered 2018-09-30 – 2018-10-05 (×10): 0.5 mg via RESPIRATORY_TRACT
  Filled 2018-09-30 (×10): qty 2

## 2018-09-30 MED ORDER — DONEPEZIL HCL 5 MG PO TABS
10.0000 mg | ORAL_TABLET | Freq: Every day | ORAL | Status: DC
  Administered 2018-09-30 – 2018-10-05 (×6): 10 mg via ORAL
  Filled 2018-09-30 (×6): qty 2

## 2018-09-30 MED ORDER — IPRATROPIUM-ALBUTEROL 0.5-2.5 (3) MG/3ML IN SOLN
RESPIRATORY_TRACT | Status: AC
  Administered 2018-09-30: 3 mL via RESPIRATORY_TRACT
  Filled 2018-09-30: qty 9

## 2018-09-30 MED ORDER — SODIUM CHLORIDE 0.9% FLUSH: 3.0000 mL | Freq: Once | INTRAVENOUS | Status: DC

## 2018-09-30 MED ORDER — DILTIAZEM HCL ER COATED BEADS 180 MG PO CP24
180.0000 mg | ORAL_CAPSULE | Freq: Every day | ORAL | Status: DC
  Administered 2018-10-01 – 2018-10-05 (×5): 180 mg via ORAL
  Filled 2018-09-30 (×5): qty 1

## 2018-09-30 NOTE — ED Triage Notes (Signed)
Pt comes via POV from home with c/o [facility] and chest pain. Pt states left sided chest pain. Pt wears 3 L at home.  Pt states all day she has been going down hill all day long.  Labored breathing and wheezing. Pt states she was just discharged from ICU with pneumonia.

## 2018-09-30 NOTE — Progress Notes (Signed)
Family Meeting Note  Advance Directive:yes  Today a meeting took place with the Patient.    The following clinical team members were present during this meeting:MD  The following were discussed:Patient's diagnosis:Acute hypoxic resp failure  , acute COPD exacerbation, continues to smoke, on BiPAP, recent fall new T5 compression fracture, essential hypertension, peripheral vascular disease and other medical problems and treatment plan of care discussed in detail with the patient.  She verbalized understanding of the plan   patient's progosis: Unable to determine and Goals for treatment: Full Code   Patient's mother-in-law Carney Bern is healthcare POA  Additional follow-up to be provided: Pulmonologist, hospitalist  Time spent during discussion:17 min  Ramonita Lab, MD

## 2018-09-30 NOTE — Progress Notes (Signed)
eLink Physician-Brief Progress Note Patient Name: Caitlyn Evans DOB: May 15, 1966 MRN: [id number]   Date of Service  09/30/2018  HPI/Events of Note  Pt admitted with acute on chronic respiratory failure due to acute exacerbation of COPD. She was recently discharged from [facility] following Rx for pneumonia  eICU Interventions  Lovenox 40 mg sq daily for DVT prophylaxis, I agree with rest of her prescribed Rx.        Thomasene Lot Ogan 09/30/2018, 9:50 PM

## 2018-09-30 NOTE — ED Notes (Signed)
Pt roomed and setting up for EKG. Xray at door to take pt to xray. Informed RN Berline Lopes that pt needed EKG and labs.

## 2018-09-30 NOTE — ED Notes (Signed)
Attempted to give report. Stated that charge nurse would call back.

## 2018-09-30 NOTE — Consult Note (Signed)
ORTHOPAEDIC CONSULTATION  PATIENT NAME: Caitlyn Evans DOB: [birth year]  MRN: [id number]  REQUESTING PHYSICIAN: Enid Baas, MD  Chief Complaint: mid/upper back pain  HPI: Caitlyn Evans is a 53 y.o. female who was admitted for acute on chronic hypoxic respiratory failure. She apparently fell 2-3 days ago and complains of mid to upper back pain. She denies any radiation of the pain down the arms. She denies any gross numbness, weakness, or causalgias. She denies any bowel or bladder dysfunction. She underwent T6 kyphoplasty on 09/17/2018 performed by Dr. Rosita Kea. A CT angio study of the chest was performed and demonstrated an acute T5 vertebral compression fracture.    Past Medical History:  Diagnosis Date  . Anxiety    panic attacks  . Asthma   . BOOP (bronchiolitis obliterans with organizing pneumonia) (HCC)   . CHF (congestive heart failure) (HCC)   . Dysphagia, pharyngoesophageal phase 05/30/2015  . Emphysema lung (HCC) 05/30/2015   on steroids and nebulizer for copd issues.  09/15/18  . GERD (gastroesophageal reflux disease)   . Hypercholesterolemia   . Hypertension   . Lung mass   . Migraines   . Motion sickness    all moving vehicles  . Myocardial infarction (HCC) 2012  . Respiratory failure with hypercapnia (HCC) 08/2018   was hospitalized and on bipap. steroids and nebulizer treatments helped  . Seasonal allergies   . Shortness of breath dyspnea    1 flight-stairs  . Sternal fracture    Past Surgical History:  Procedure Laterality Date  . COLONOSCOPY WITH PROPOFOL N/A 06/02/2015   Procedure: COLONOSCOPY WITH PROPOFOL;  Surgeon: Midge Minium, MD;  Location: [facility];  Service: Endoscopy;  Laterality: N/A;  . ESOPHAGOGASTRODUODENOSCOPY (EGD) WITH PROPOFOL N/A 06/02/2015   Procedure: ESOPHAGOGASTRODUODENOSCOPY (EGD) WITH PROPOFOL withdialation;  Surgeon: Midge Minium, MD;  Location: [facility];  Service: Endoscopy;  Laterality: N/A;  .  ESOPHAGOGASTRODUODENOSCOPY (EGD) WITH PROPOFOL N/A 11/05/2017   Procedure: ESOPHAGOGASTRODUODENOSCOPY (EGD) WITH PROPOFOL;  Surgeon: Pasty Spillers, MD;  Location: [facility];  Service: Endoscopy;  Laterality: N/A;  . KYPHOPLASTY N/A 09/17/2018   Procedure: KYPHOPLASTY T6;  Surgeon: Kennedy Bucker, MD;  Location: [facility];  Service: Orthopedics;  Laterality: N/A;  . LUNG SURGERY Left    Upper lobe removed  . OOPHORECTOMY Left   . VAGINAL HYSTERECTOMY     Social History   Socioeconomic History  . Marital status: Married    Spouse name: thomas  . Number of children: Not on file  . Years of education: Not on file  . Highest education level: Not on file  Occupational History    Comment: disabled  Social Needs  . Financial resource strain: Not on file  . Food insecurity:    Worry: Not on file    Inability: Not on file  . Transportation needs:    Medical: Not on file    Non-medical: Not on file  Tobacco Use  . Smoking status: Former Smoker    Packs/day: 1.00    Years: 30.00    Pack years: 30.00    Types: Cigarettes  . Smokeless tobacco: Never Used  . Tobacco comment: quit some time in 2012  Substance and Sexual Activity  . Alcohol use: No    Alcohol/week: 0.0 standard drinks    Comment: rare consumption  . Drug use: No  . Sexual activity: Not Currently    Partners: Male  Lifestyle  . Physical activity:    Days per week: Not on file  Minutes per session: Not on file  . Stress: Not on file  Relationships  . Social connections:    Talks on phone: Not on file    Gets together: Not on file    Attends religious service: Not on file    Active member of club or organization: Not on file    Attends meetings of clubs or organizations: Not on file    Relationship status: Not on file  Other Topics Concern  . Not on file  Social History Narrative  . Not on file   Family History  Problem Relation Age of Onset  . COPD Mother   . Heart attack Mother   . Stroke Father    . COPD Father    Allergies  Allergen Reactions  . Codeine Hives, Nausea And Vomiting and Nausea Only  . Penicillins Hives and Other (See Comments)    Has patient had a PCN reaction causing immediate rash, facial/tongue/throat swelling, SOB or lightheadedness with hypotension: No Has patient had a PCN reaction causing severe rash involving mucus membranes or skin necrosis: No Has patient had a PCN reaction that required hospitalization No Has patient had a PCN reaction occurring within the last 10 years: No If all of the above answers are "NO", then may proceed with Cephalosporin use. Other reaction(s): Other (See Comments) Has patient had a PCN   Prior to Admission medications   Medication Sig Start Date End Date Taking? Authorizing Provider  ALPRAZolam Prudy Feeler(XANAX) 0.5 MG tablet Take 0.5 mg by mouth 3 (three) times daily.    Yes [provider]  clindamycin (CLEOCIN) 300 MG capsule Take 1 capsule (300 mg total) by mouth 3 (three) times daily for 5 days. 09/26/18 10/01/18 Yes Shaune Pollackhen, Qing, MD  diltiazem (CARDIZEM CD) 180 MG 24 hr capsule Take 1 capsule (180 mg total) by mouth daily. 11/26/17  Yes Enid BaasKalisetti, Radhika, MD  donepezil (ARICEPT) 10 MG tablet Take 10 mg by mouth daily.    Yes [provider]  furosemide (LASIX) 20 MG tablet Take 1 tablet (20 mg total) by mouth daily. 11/26/17  Yes Enid BaasKalisetti, Radhika, MD  guaiFENesin (ROBITUSSIN) 100 MG/5ML SOLN Take 5 mLs (100 mg total) by mouth every 4 (four) hours as needed for cough or to loosen phlegm. 09/26/18  Yes Shaune Pollackhen, Qing, MD  ipratropium-albuterol (DUONEB) 0.5-2.5 (3) MG/3ML SOLN Inhale 3 mLs into the lungs every 4 (four) hours as needed (for wheezing/shortness of breath). Patient taking differently: Inhale 3 mLs into the lungs every 4 (four) hours as needed. For wheezing/shortness of breath 05/13/16  Yes Enid BaasKalisetti, Radhika, MD  metoprolol tartrate (LOPRESSOR) 25 MG tablet Take 1 tablet (25 mg total) by mouth 2 (two) times daily.  11/25/17  Yes Enid BaasKalisetti, Radhika, MD  oxyCODONE-acetaminophen (PERCOCET) 5-325 MG tablet Take 1 tablet by mouth every 6 (six) hours as needed for severe pain. 09/17/18 09/17/19 Yes Kennedy BuckerMenz, Michael, MD  pantoprazole (PROTONIX) 40 MG tablet TAKE ONE TABLET BY MOUTH TWICE DAILY Patient taking differently: Take 40 mg by mouth 2 (two) times daily.  04/03/18  Yes Melodie Bouillonahiliani, Varnita B, MD  rosuvastatin (CRESTOR) 10 MG tablet Take 10 mg by mouth daily.  10/10/17  Yes [provider]  tamsulosin (FLOMAX) 0.4 MG CAPS capsule Take 1 capsule (0.4 mg total) by mouth daily. 11/26/17  Yes Enid BaasKalisetti, Radhika, MD  predniSONE (DELTASONE) 10 MG tablet 30 mg po daily 1 day, 20 mg po daily 1 day, 10 mg po daily 1 day. Patient not taking: Reported on 09/30/2018 09/26/18  Shaune Pollackhen, Qing, MD   Dg Chest 2 View  Result Date: 09/30/2018 CLINICAL DATA:  Acute onset of LEFT-sided chest pain and shortness of breath that began today. Patient recently discharged from the hospital after treatment for multifocal pneumonia. Prior LEFT UPPER LOBE partial resection. EXAM: CHEST - 2 VIEW COMPARISON:  CT chest 09/23/2018 and earlier. Chest x-rays 09/22/2018 and earlier. FINDINGS: AP ERECT and LATERAL images were obtained. Interval resolution of the patchy ground-glass airspace opacities in both lungs since the CT 1 week ago. Biapical pleuroparenchymal scarring and LEFT basilar pleuroparenchymal scarring, unchanged. Mild central peribronchial thickening, unchanged. Lungs otherwise clear. No confluent airspace consolidation currently. No pleural effusions. Normal pulmonary vascularity. Surgical suture material in the LEFT UPPER LOBE. Prior augmentation of an UPPER thoracic vertebral body. IMPRESSION: Interval resolution of the patchy ground-glass airspace opacities in both lungs since the CT 1 week ago. Stable mild changes of chronic bronchitis and/or asthma. No acute cardiopulmonary disease currently. Electronically Signed   By: Hulan Saashomas  Lawrence M.D.    On: 09/30/2018 15:12   Ct Angio Chest Pe W And/or Wo Contrast  Result Date: 09/30/2018 CLINICAL DATA:  53 year old female with shortness of breath and chest pain, labored breathing and wheezing. Recently discharged with pneumonia. Suspect pulmonary embolism, high pretest probability. EXAM: CT ANGIOGRAPHY CHEST WITH CONTRAST TECHNIQUE: Multidetector CT imaging of the chest was performed using the standard protocol during bolus administration of intravenous contrast. Multiplanar CT image reconstructions and MIPs were obtained to evaluate the vascular anatomy. CONTRAST:  75mL OMNIPAQUE IOHEXOL 350 MG/ML SOLN COMPARISON:  Recent CT scan of the chest 09/23/2018 FINDINGS: Cardiovascular: Adequate opacification of the pulmonary arteries to the proximal segmental level. No evidence of central filling defect to suggest acute pulmonary embolus. The main pulmonary artery is normal in size. Normal caliber thoracic aorta with conventional 3 vessel arch anatomy. No evidence of dissection. The heart is normal in size. No pericardial effusion. Mediastinum/Nodes: Unremarkable CT appearance of the thyroid gland. No suspicious mediastinal or hilar adenopathy. No soft tissue mediastinal mass. The thoracic esophagus is unremarkable. Lungs/Pleura: Biapical pleuroparenchymal scarring again noted. Multifocal areas of prior consolidative airspace opacity are resolving leaving multiple small cystic spaces behind. The findings are most consistent with resolving pneumonia. No new focal airspace opacity is identified. No evidence of pulmonary edema. No pleural effusion or pneumothorax. Upper Abdomen: No acute abnormality. Musculoskeletal: New/progressive T5 vertebral fracture with approximately 60% height loss. Evidence of prior T6 compression fracture status post cement augmentation. Chronic T12 superior endplate compression fracture without significant interval change. Less than 20% height loss. 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 Review of the MIP images confirms the above findings. IMPRESSION: 1. Negative for acute pulmonary embolus. 2. Significant interval improvement in multifocal airspace opacities compared to 09/23/2018. CT findings are most consistent with largely resolved multifocal pneumonia with only trace residual scarring and small pneumatoceles. 3. New/progressed T5 compression fracture now with approximately 60% height loss. 4. Stable remote T6 and T12 compression fractures with evidence of prior cement augmentation at T6. 5. 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 Electronically Signed   By: Malachy MoanHeath  McCullough M.D.   On: 09/30/2018 17:47   Positive ROS: All other systems have been reviewed and were otherwise negative with the exception of those mentioned in the HPI and as above.  Physical Exam: General: Well developed, well nourished female seen in no acute distress. HEENT: Atraumatic and normocephalic. Sclera are clear. Extraocular motion is intact. Oropharynx is clear  with moist mucosa. Neck: Supple, nontender, good range of motion. No JVD or carotid bruits. Lungs: Coarse bilateral breath sounds with rhonchi. Cardiovascular: Regular rate and rhythm with normal S1 and S2. No murmurs. No gallops or rubs. Pedal pulses are palpable bilaterally. Homans test is negative bilaterally. No significant pretibial or ankle edema. Abdomen: Soft, nontender, and nondistended. Bowel sounds are present. Skin: Extensive ecchymosis to bilateral lower extremities and both arms. Small area of resolving ecchymosis to upper back that may be in the area of the previous kyphoplasty. Neurologic: Awake, alert, and oriented. Sensory function is grossly intact. Motor strength is felt to be 5 over 5 bilaterally. No clonus or tremor. Good motor coordination. Lymphatic: No axillary or cervical lymphadenopathy  MUSCULOSKELETAL: Pertinent examination involves the back. There is  tenderness to palpation along the midline of the upper thoracic spine. Range of motion of the spine is limited by guarding.  Assessment: Acute T5 vertebral compression fracture   Plan: Continue pain control measures.I will order an MRI of the thoracic spine. Dr. Rosita Kea will be out of town for approximately 1 week. I will ask him to review the MRI and evaluate the patient for possible T5 kyphoplasty upon his return.   Lenix Benoist P. Angie Fava M.D.

## 2018-09-30 NOTE — H&P (Addendum)
[facility] [place] [facility]   PATIENT NAME: Caitlyn Evans    MR#:  [id number]  DATE OF BIRTH:  Apr 13, 1966  DATE OF ADMISSION:  09/30/2018  PRIMARY CARE PHYSICIAN: Corky DownsMasoud, Javed, MD   REQUESTING/REFERRING PHYSICIAN: Nita SickleVeronese, Bynum, MD  CHIEF COMPLAINT:   Shortness of breath HISTORY OF PRESENT ILLNESS:  Caitlyn Evans  is a 53 y.o. female with a known history of COPD on chronic 3 L of oxygen via nasal cannula, hypertension, hyperlipidemia, coronary artery disease, still continues to smoke is presenting to the ED with a chief complaint of worsening of shortness of breath.  Patient was just discharged from the hospital 4 days ago with the same diagnosis of acute on chronic hypoxic respiratory failure secondary to COPD and multifocal right-sided pneumonia.  CT chest has revealed resolving pneumonia.  Patient is placed on BiPAP and she was diffusely wheezing.  Patient reports that she fell 2 to 3 days ago and her back is hurting CT has revealed new T5 compression fracture Patient admits that she continues to smoke but very few cigarettes  PAST MEDICAL HISTORY:   Past Medical History:  Diagnosis Date  . Anxiety    panic attacks  . Asthma   . BOOP (bronchiolitis obliterans with organizing pneumonia) (HCC)   . CHF (congestive heart failure) (HCC)   . Dysphagia, pharyngoesophageal phase 05/30/2015  . Emphysema lung (HCC) 05/30/2015   on steroids and nebulizer for copd issues.  09/15/18  . GERD (gastroesophageal reflux disease)   . Hypercholesterolemia   . Hypertension   . Lung mass   . Migraines   . Motion sickness    all moving vehicles  . Myocardial infarction (HCC) 2012  . Respiratory failure with hypercapnia (HCC) 08/2018   was hospitalized and on bipap. steroids and nebulizer treatments helped  . Seasonal allergies   . Shortness of breath dyspnea    1 flight-stairs  . Sternal fracture     PAST SURGICAL HISTOIRY:   Past Surgical History:   Procedure Laterality Date  . COLONOSCOPY WITH PROPOFOL N/A 06/02/2015   Procedure: COLONOSCOPY WITH PROPOFOL;  Surgeon: Midge Miniumarren Wohl, MD;  [facility];  Service: Endoscopy;  Laterality: N/A;  . ESOPHAGOGASTRODUODENOSCOPY (EGD) WITH PROPOFOL N/A 06/02/2015   Procedure: ESOPHAGOGASTRODUODENOSCOPY (EGD) WITH PROPOFOL withdialation;  Surgeon: Midge Miniumarren Wohl, MD;  [facility];  Service: Endoscopy;  Laterality: N/A;  . ESOPHAGOGASTRODUODENOSCOPY (EGD) WITH PROPOFOL N/A 11/05/2017   Procedure: ESOPHAGOGASTRODUODENOSCOPY (EGD) WITH PROPOFOL;  Surgeon: Pasty Spillersahiliani, Varnita B, MD;  Location: [facility] ENDOSCOPY;  Service: Endoscopy;  Laterality: N/A;  . KYPHOPLASTY N/A 09/17/2018   Procedure: KYPHOPLASTY T6;  Surgeon: Kennedy BuckerMenz, Michael, MD;  Location: [facility] ORS;  Service: Orthopedics;  Laterality: N/A;  . LUNG SURGERY Left    Upper lobe removed  . OOPHORECTOMY Left   . VAGINAL HYSTERECTOMY      SOCIAL HISTORY:   Social History   Tobacco Use  . Smoking status: Former Smoker    Packs/day: 1.00    Years: 30.00    Pack years: 30.00    Types: Cigarettes  . Smokeless tobacco: Never Used  . Tobacco comment: quit some time in 2012  Substance Use Topics  . Alcohol use: No    Alcohol/week: 0.0 standard drinks    Comment: rare consumption    FAMILY HISTORY:   Family History  Problem Relation Age of Onset  . COPD Mother   . Heart attack Mother   . Stroke Father   . COPD  Father     DRUG ALLERGIES:   Allergies  Allergen Reactions  . Codeine Hives, Nausea And Vomiting and Nausea Only  . Penicillins Hives and Other (See Comments)    Has patient had a PCN reaction causing immediate rash, facial/tongue/throat swelling, SOB or lightheadedness with hypotension: No Has patient had a PCN reaction causing severe rash involving mucus membranes or skin necrosis: No Has patient had a PCN reaction that required hospitalization No Has patient had a PCN reaction occurring within the  last 10 years: No If all of the above answers are "NO", then may proceed with Cephalosporin use. Other reaction(s): Other (See Comments) Has patient had a PCN    REVIEW OF SYSTEMS:  Review of system-limited as patient is on BiPAP CONSTITUTIONAL: No fever, fatigue.  Reporting weakness.  EYES: No blurred or double vision.  EARS, NOSE, AND THROAT: No tinnitus or ear pain.  RESPIRATORY: Has cough, shortness of breath, wheezing, denies hemoptysis CARDIOVASCULAR: No chest pain, orthopnea, edema.  GASTROINTESTINAL: No nausea, vomiting, diarrhea or abdominal pain.  SKIN: Multiple bruises on the extremities MUSCULOSKELETAL: Reporting back pain NEUROLOGIC: No tingling, numbness, weakness.  PSYCHIATRY: No anxiety or depression.   MEDICATIONS AT HOME:   Prior to Admission medications   Medication Sig Start Date End Date Taking? Authorizing Provider  ALPRAZolam Prudy Feeler) 0.5 MG tablet Take 0.5 mg by mouth 3 (three) times daily.    Yes [provider]  clindamycin (CLEOCIN) 300 MG capsule Take 1 capsule (300 mg total) by mouth 3 (three) times daily for 5 days. 09/26/18 10/01/18 Yes Shaune Pollack, MD  diltiazem (CARDIZEM CD) 180 MG 24 hr capsule Take 1 capsule (180 mg total) by mouth daily. 11/26/17  Yes Enid Baas, MD  donepezil (ARICEPT) 10 MG tablet Take 10 mg by mouth daily.    Yes [provider]  furosemide (LASIX) 20 MG tablet Take 1 tablet (20 mg total) by mouth daily. 11/26/17  Yes Enid Baas, MD  guaiFENesin (ROBITUSSIN) 100 MG/5ML SOLN Take 5 mLs (100 mg total) by mouth every 4 (four) hours as needed for cough or to loosen phlegm. 09/26/18  Yes Shaune Pollack, MD  ipratropium-albuterol (DUONEB) 0.5-2.5 (3) MG/3ML SOLN Inhale 3 mLs into the lungs every 4 (four) hours as needed (for wheezing/shortness of breath). Patient taking differently: Inhale 3 mLs into the lungs every 4 (four) hours as needed. For wheezing/shortness of breath 05/13/16  Yes Enid Baas, MD   metoprolol tartrate (LOPRESSOR) 25 MG tablet Take 1 tablet (25 mg total) by mouth 2 (two) times daily. 11/25/17  Yes Enid Baas, MD  oxyCODONE-acetaminophen (PERCOCET) 5-325 MG tablet Take 1 tablet by mouth every 6 (six) hours as needed for severe pain. 09/17/18 09/17/19 Yes Kennedy Bucker, MD  pantoprazole (PROTONIX) 40 MG tablet TAKE ONE TABLET BY MOUTH TWICE DAILY Patient taking differently: Take 40 mg by mouth 2 (two) times daily.  04/03/18  Yes Melodie Bouillon B, MD  rosuvastatin (CRESTOR) 10 MG tablet Take 10 mg by mouth daily.  10/10/17  Yes [provider]  tamsulosin (FLOMAX) 0.4 MG CAPS capsule Take 1 capsule (0.4 mg total) by mouth daily. 11/26/17  Yes Enid Baas, MD  predniSONE (DELTASONE) 10 MG tablet 30 mg po daily 1 day, 20 mg po daily 1 day, 10 mg po daily 1 day. Patient not taking: Reported on 09/30/2018 09/26/18   Shaune Pollack, MD      VITAL SIGNS:  Blood pressure 123/88, pulse 82, temperature 98.2 F (36.8 C), temperature source Oral, resp.  rate (!) 23, height 4\' 10"  (1.473 m), weight 52.2 kg, SpO2 100 %.  PHYSICAL EXAMINATION:  GENERAL:  53 y.o.-year-old patient lying in the bed with no acute distress.  EYES: Pupils equal, round, reactive to light and accommodation. No scleral icterus. Extraocular muscles intact.  HEENT: Head atraumatic, normocephalic. Oropharynx and nasopharynx clear.  NECK:  Supple, no jugular venous distention. No thyroid enlargement, no tenderness.  LUNGS: Diminished coarse breath sounds bilaterally, diffuse wheezing, no rales,rhonchi or crepitation. No use of accessory muscles of respiration.  CARDIOVASCULAR: S1, S2 normal. No murmurs, rubs, or gallops.  ABDOMEN: Soft, nontender, nondistended. Bowel sounds present. Thoracic area is tender EXTREMITIES: No pedal edema, cyanosis, or clubbing.  NEUROLOGIC: Awake, alert and oriented x3 sensation intact. Gait not checked.  PSYCHIATRIC: The patient is alert and oriented x 3.  SKIN:  Multiple bruises on extremities  LABORATORY PANEL:   CBC Recent Labs  Lab 09/30/18 1525  WBC 16.1*  HGB 12.1  HCT 39.2  PLT 354   ------------------------------------------------------------------------------------------------------------------  Chemistries  Recent Labs  Lab 09/25/18 0248  09/30/18 1525  NA 141   < > 136  K 4.0   < > 3.9  CL 100   < > 96*  CO2 34*   < > 30  GLUCOSE 122*   < > 133*  BUN 29*   < > 16  CREATININE 0.62   < > 0.53  CALCIUM 8.9   < > 8.5*  MG 2.4  --   --    < > = values in this interval not displayed.   ------------------------------------------------------------------------------------------------------------------  Cardiac Enzymes Recent Labs  Lab 09/30/18 1525  TROPONINI <0.03   ------------------------------------------------------------------------------------------------------------------  RADIOLOGY:  Dg Chest 2 View  Result Date: 09/30/2018 CLINICAL DATA:  Acute onset of LEFT-sided chest pain and shortness of breath that began today. Patient recently discharged from the hospital after treatment for multifocal pneumonia. Prior LEFT UPPER LOBE partial resection. EXAM: CHEST - 2 VIEW COMPARISON:  CT chest 09/23/2018 and earlier. Chest x-rays 09/22/2018 and earlier. FINDINGS: AP ERECT and LATERAL images were obtained. Interval resolution of the patchy ground-glass airspace opacities in both lungs since the CT 1 week ago. Biapical pleuroparenchymal scarring and LEFT basilar pleuroparenchymal scarring, unchanged. Mild central peribronchial thickening, unchanged. Lungs otherwise clear. No confluent airspace consolidation currently. No pleural effusions. Normal pulmonary vascularity. Surgical suture material in the LEFT UPPER LOBE. Prior augmentation of an UPPER thoracic vertebral body. IMPRESSION: Interval resolution of the patchy ground-glass airspace opacities in both lungs since the CT 1 week ago. Stable mild changes of chronic bronchitis  and/or asthma. No acute cardiopulmonary disease currently. Electronically Signed   By: Hulan Saas M.D.   On: 09/30/2018 15:12   Ct Angio Chest Pe W And/or Wo Contrast  Result Date: 09/30/2018 CLINICAL DATA:  53 year old female with shortness of breath and chest pain, labored breathing and wheezing. Recently discharged with pneumonia. Suspect pulmonary embolism, high pretest probability. EXAM: CT ANGIOGRAPHY CHEST WITH CONTRAST TECHNIQUE: Multidetector CT imaging of the chest was performed using the standard protocol during bolus administration of intravenous contrast. Multiplanar CT image reconstructions and MIPs were obtained to evaluate the vascular anatomy. CONTRAST:  75mL OMNIPAQUE IOHEXOL 350 MG/ML SOLN COMPARISON:  Recent CT scan of the chest 09/23/2018 FINDINGS: Cardiovascular: Adequate opacification of the pulmonary arteries to the proximal segmental level. No evidence of central filling defect to suggest acute pulmonary embolus. The main pulmonary artery is normal in size. Normal caliber thoracic aorta with conventional 3 vessel arch  anatomy. No evidence of dissection. The heart is normal in size. No pericardial effusion. Mediastinum/Nodes: Unremarkable CT appearance of the thyroid gland. No suspicious mediastinal or hilar adenopathy. No soft tissue mediastinal mass. The thoracic esophagus is unremarkable. Lungs/Pleura: Biapical pleuroparenchymal scarring again noted. Multifocal areas of prior consolidative airspace opacity are resolving leaving multiple small cystic spaces behind. The findings are most consistent with resolving pneumonia. No new focal airspace opacity is identified. No evidence of pulmonary edema. No pleural effusion or pneumothorax. Upper Abdomen: No acute abnormality. Musculoskeletal: New/progressive T5 vertebral fracture with approximately 60% height loss. Evidence of prior T6 compression fracture status post cement augmentation. Chronic T12 superior endplate compression  fracture without significant interval change. Less than 20% height loss. 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 Review of the MIP images confirms the above findings. IMPRESSION: 1. Negative for acute pulmonary embolus. 2. Significant interval improvement in multifocal airspace opacities compared to 09/23/2018. CT findings are most consistent with largely resolved multifocal pneumonia with only trace residual scarring and small pneumatoceles. 3. New/progressed T5 compression fracture now with approximately 60% height loss. 4. Stable remote T6 and T12 compression fractures with evidence of prior cement augmentation at T6. 5. 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 Electronically Signed   By: Malachy MoanHeath  McCullough M.D.   On: 09/30/2018 17:47    EKG:   Orders placed or performed during the hospital encounter of 09/30/18  . ED EKG  . ED EKG  . EKG 12-Lead  . EKG 12-Lead    IMPRESSION AND PLAN:     #Acute on chronic respiratory failure with hypoxia secondary to COPD exacerbation Admit to stepdown unit Continue BiPAP, n.p.o. Consult placed to intensivist and discussed with Dr. Belia HemanKasa, he is aware of the consult Solu-Medrol 125 mg IV was given will continue Solu-Medrol Bronchodilator treatment CT chest with resolving pneumonia, complete the course of clindamycin which was given during the previous hospital discharge  #Acute  back pain secondary to T5 compression fracture from recent fall with  height t loss Pain management as needed Consider orthopedics Dr. Rosita KeaMenz consult for possible kyphoplasty  #Essential hypertension Blood pressure is stable.  Hold antihypertensive as patient is currently n.p.o.  #Tobacco abuse disorder Counseled patient to quit smoking completely for 5 minutes.  She verbalized understanding of the plan.  She will think about nicotine patch   All the records are reviewed and case discussed with ED provider. Management  plans discussed with the patient, she is  in agreement.  CODE STATUS: Full code  TOTAL TIME TAKING CARE OF THIS PATIENT: 45minutes.   Note: This dictation was prepared with Dragon dictation along with smaller phrase technology. Any transcriptional errors that result from this process are unintentional.  Ramonita LabAruna Telesia Ates M.D on 09/30/2018 at 6:41 PM  Between 7am to 6pm - Pager - [phone]  After 6pm go to [email or web address] - password [email or web address] [facility]  [facility]  Office  [phone]  CC: Primary care physician; Corky DownsMasoud, Javed, MD

## 2018-09-30 NOTE — Progress Notes (Signed)
Patient asked to eat her dinner and wanted to be off the bipap to do so. Spoke with NP and NP said he does not recommend it, but it patient is adamant about it, she can eat. Patient decided she wanted to eat. Placed on 3L oxygen per [place] that patient is on at home. Continue to monitor.

## 2018-09-30 NOTE — ED Provider Notes (Signed)
[facility] Emergency Department Provider Note  ____________________________________________  Time seen: Approximately 4:16 PM  I have reviewed the triage vital signs and the nursing notes.   HISTORY  Chief Complaint Shortness of Breath   HPI Shahla Judie Petit Lepp is a 53 y.o. female with a history of COPD on 3 L nasal cannula, HFpEF, hypertension, hyperlipidemia, CAD who presents for evaluation of shortness of breath.  Patient was discharged from the hospital 4 days ago after being admitted for acute on chronic hypoxic respiratory failure secondary to COPD and multifocal right-sided pneumonia.  She is currently on clindamycin.  According to her mother she has been getting progressively worse since coming home.  Today her shortness of breath became constant and severe, she is wheezing.  She has had no fevers at home.  Patient still coughing but reports that her cough is improved.  No vomiting or diarrhea.  Patient is also complaining of diffuse tightness across her chest which has been present since this morning.  No abdominal pain.   Past Medical History:  Diagnosis Date  . Anxiety    panic attacks  . Asthma   . BOOP (bronchiolitis obliterans with organizing pneumonia) (HCC)   . CHF (congestive heart failure) (HCC)   . Dysphagia, pharyngoesophageal phase 05/30/2015  . Emphysema lung (HCC) 05/30/2015   on steroids and nebulizer for copd issues.  09/15/18  . GERD (gastroesophageal reflux disease)   . Hypercholesterolemia   . Hypertension   . Lung mass   . Migraines   . Motion sickness    all moving vehicles  . Myocardial infarction (HCC) 2012  . Respiratory failure with hypercapnia (HCC) 08/2018   was hospitalized and on bipap. steroids and nebulizer treatments helped  . Seasonal allergies   . Shortness of breath dyspnea    1 flight-stairs  . Sternal fracture     Patient Active Problem List   Diagnosis Date Noted  . Acute on chronic respiratory failure  with hypoxia (HCC) 09/09/2018  . Esophagitis, unspecified   . Stomach irritation   . Hiatal hernia   . COPD (chronic obstructive pulmonary disease) (HCC) 07/19/2016  . Acute on chronic respiratory failure (HCC) 05/09/2016  . Noncompliance with medication regimen 05/09/2016  . COPD with acute exacerbation (HCC) 02/05/2016  . Acute exacerbation of chronic obstructive pulmonary disease (COPD) (HCC)   . Acute respiratory failure (HCC)   . Acute respiratory failure with hypoxia (HCC) 12/05/2015  . BOOP (bronchiolitis obliterans with organizing pneumonia) (HCC) 12/05/2015  . Depression, major, single episode, moderate (HCC) 07/14/2015  . Adjustment disorder with anxiety 07/14/2015  . COPD exacerbation (HCC) 07/14/2015  . Special screening for malignant neoplasms, colon   . Swallowing difficulty   . Loss of weight   . Esophagogastric ulcer   . Feline esophagus   . Hypertension 05/30/2015  . Emphysema lung (HCC) 05/30/2015  . Dysphagia, pharyngoesophageal phase 05/30/2015    Past Surgical History:  Procedure Laterality Date  . COLONOSCOPY WITH PROPOFOL N/A 06/02/2015   Procedure: COLONOSCOPY WITH PROPOFOL;  Surgeon: Midge Minium, MD;  Location: [facility];  Service: Endoscopy;  Laterality: N/A;  . ESOPHAGOGASTRODUODENOSCOPY (EGD) WITH PROPOFOL N/A 06/02/2015   Procedure: ESOPHAGOGASTRODUODENOSCOPY (EGD) WITH PROPOFOL withdialation;  Surgeon: Midge Minium, MD;  Location: [facility];  Service: Endoscopy;  Laterality: N/A;  . ESOPHAGOGASTRODUODENOSCOPY (EGD) WITH PROPOFOL N/A 11/05/2017   Procedure: ESOPHAGOGASTRODUODENOSCOPY (EGD) WITH PROPOFOL;  Surgeon: Pasty Spillers, MD;  Location: [facility] END[facility];  Service: Endoscopy;  Laterality: N/A;  .  KYPHOPLASTY N/A 09/17/2018   Procedure: KYPHOPLASTY T6;  Surgeon: Kennedy Bucker, MD;  Location: [facility] ORS;  Service: Orthopedics;  Laterality: N/A;  . LUNG SURGERY Left    Upper lobe removed  . OOPHORECTOMY Left   . VAGINAL  HYSTERECTOMY      Prior to Admission medications   Medication Sig Start Date End Date Taking? Authorizing Provider  ALPRAZolam Prudy Feeler) 0.5 MG tablet Take 0.5 mg by mouth 3 (three) times daily.    Yes [provider]  clindamycin (CLEOCIN) 300 MG capsule Take 1 capsule (300 mg total) by mouth 3 (three) times daily for 5 days. 09/26/18 10/01/18 Yes Shaune Pollack, MD  diltiazem (CARDIZEM CD) 180 MG 24 hr capsule Take 1 capsule (180 mg total) by mouth daily. 11/26/17  Yes Enid Baas, MD  donepezil (ARICEPT) 10 MG tablet Take 10 mg by mouth daily.    Yes [provider]  furosemide (LASIX) 20 MG tablet Take 1 tablet (20 mg total) by mouth daily. 11/26/17  Yes Enid Baas, MD  guaiFENesin (ROBITUSSIN) 100 MG/5ML SOLN Take 5 mLs (100 mg total) by mouth every 4 (four) hours as needed for cough or to loosen phlegm. 09/26/18  Yes Shaune Pollack, MD  ipratropium-albuterol (DUONEB) 0.5-2.5 (3) MG/3ML SOLN Inhale 3 mLs into the lungs every 4 (four) hours as needed (for wheezing/shortness of breath). Patient taking differently: Inhale 3 mLs into the lungs every 4 (four) hours as needed. For wheezing/shortness of breath 05/13/16  Yes Enid Baas, MD  metoprolol tartrate (LOPRESSOR) 25 MG tablet Take 1 tablet (25 mg total) by mouth 2 (two) times daily. 11/25/17  Yes Enid Baas, MD  oxyCODONE-acetaminophen (PERCOCET) 5-325 MG tablet Take 1 tablet by mouth every 6 (six) hours as needed for severe pain. 09/17/18 09/17/19 Yes Kennedy Bucker, MD  pantoprazole (PROTONIX) 40 MG tablet TAKE ONE TABLET BY MOUTH TWICE DAILY Patient taking differently: Take 40 mg by mouth 2 (two) times daily.  04/03/18  Yes Melodie Bouillon B, MD  rosuvastatin (CRESTOR) 10 MG tablet Take 10 mg by mouth daily.  10/10/17  Yes [provider]  tamsulosin (FLOMAX) 0.4 MG CAPS capsule Take 1 capsule (0.4 mg total) by mouth daily. 11/26/17  Yes Enid Baas, MD  predniSONE (DELTASONE) 10 MG tablet 30 mg  po daily 1 day, 20 mg po daily 1 day, 10 mg po daily 1 day. Patient not taking: Reported on 09/30/2018 09/26/18   Shaune Pollack, MD    Allergies Codeine and Penicillins  Family History  Problem Relation Age of Onset  . COPD Mother   . Heart attack Mother   . Stroke Father   . COPD Father     Social History Social History   Tobacco Use  . Smoking status: Former Smoker    Packs/day: 1.00    Years: 30.00    Pack years: 30.00    Types: Cigarettes  . Smokeless tobacco: Never Used  . Tobacco comment: quit some time in 2012  Substance Use Topics  . Alcohol use: No    Alcohol/week: 0.0 standard drinks    Comment: rare consumption  . Drug use: No    Review of Systems  Constitutional: Negative for fever. Eyes: Negative for visual changes. ENT: Negative for sore throat. Neck: No neck pain  Cardiovascular: Negative for chest pain. Respiratory: + shortness of breath. Gastrointestinal: Negative for abdominal pain, vomiting or diarrhea. Genitourinary: Negative for dysuria. Musculoskeletal: Negative for back pain. Skin: Negative for rash. Neurological: Negative for headaches, weakness or numbness.  Psych: No SI or HI  ____________________________________________   PHYSICAL EXAM:  VITAL SIGNS: ED Triage Vitals  Enc Vitals Group     BP 09/30/18 1444 (!) 156/94     Pulse Rate 09/30/18 1444 (!) 59     Resp 09/30/18 1444 18     Temp 09/30/18 1444 98.2 F (36.8 C)     Temp Source 09/30/18 1444 Oral     SpO2 09/30/18 1444 100 %     Weight 09/30/18 1442 115 lb (52.2 kg)     Height 09/30/18 1442 4\' 10"  (1.473 m)     Head Circumference --      Peak Flow --      Pain Score 09/30/18 1442 7     Pain Loc --      Pain Edu? --      Excl. in GC? --     Constitutional: Alert and oriented, severe increased work of breathing  HEENT:      Head: Normocephalic and atraumatic.         Eyes: Conjunctivae are normal. Sclera is non-icteric.       Mouth/Throat: Mucous membranes are moist.         Neck: Supple with no signs of meningismus. Cardiovascular: Regular rate and rhythm. No murmurs, gallops, or rubs. 2+ symmetrical distal pulses are present in all extremities. No JVD. Respiratory: Patient is tachypneic, satting well on her 3 L nasal cannula, useing accessory muscles of respiration, increased work of breathing, diffuse bilateral coarse wheezing  Gastrointestinal: Soft, non tender, and non distended with positive bowel sounds. No rebound or guarding. Musculoskeletal: Nontender with normal range of motion in all extremities. No edema, cyanosis, or erythema of extremities. Neurologic: Normal speech and language. Face is symmetric. Moving all extremities. No gross focal neurologic deficits are appreciated. Skin: Skin is warm, dry and intact. No rash noted. Psychiatric: Mood and affect are normal. Speech and behavior are normal.  ____________________________________________   LABS (all labs ordered are listed, but only abnormal results are displayed)  Labs Reviewed  BASIC METABOLIC PANEL - Abnormal; Notable for the following components:      Result Value   Chloride 96 (*)    Glucose, Bld 133 (*)    Calcium 8.5 (*)    All other components within normal limits  CBC - Abnormal; Notable for the following components:   WBC 16.1 (*)    RDW 17.7 (*)    All other components within normal limits  BLOOD GAS, VENOUS - Abnormal; Notable for the following components:   pCO2, Ven 62 (*)    pO2, Ven 31.0 (*)    Bicarbonate 37.5 (*)    Acid-Base Excess 10.1 (*)    All other components within normal limits  TROPONIN I  INFLUENZA PANEL BY PCR (TYPE A & B)  I-STAT CG4 LACTIC ACID, ED  CG4 I-STAT (LACTIC ACID)  POC URINE PREG, ED   ____________________________________________  EKG  ED ECG REPORT I, Nita Sicklearolina Jovanka Westgate, the attending physician, personally viewed and interpreted this ECG.  Normal sinus rhythm, rate of 79, normal intervals, normal axis, no ST elevations or  depressions, TWI in aVL.  No significant changes when compared to prior ____________________________________________  RADIOLOGY  I have personally reviewed the images performed during this visit and I agree with the Radiologist's read.   Interpretation by Radiologist:  Dg Chest 2 View  Result Date: 09/30/2018 CLINICAL DATA:  Acute onset of LEFT-sided chest pain and shortness of breath that began today. Patient recently  discharged from the hospital after treatment for multifocal pneumonia. Prior LEFT UPPER LOBE partial resection. EXAM: CHEST - 2 VIEW COMPARISON:  CT chest 09/23/2018 and earlier. Chest x-rays 09/22/2018 and earlier. FINDINGS: AP ERECT and LATERAL images were obtained. Interval resolution of the patchy ground-glass airspace opacities in both lungs since the CT 1 week ago. Biapical pleuroparenchymal scarring and LEFT basilar pleuroparenchymal scarring, unchanged. Mild central peribronchial thickening, unchanged. Lungs otherwise clear. No confluent airspace consolidation currently. No pleural effusions. Normal pulmonary vascularity. Surgical suture material in the LEFT UPPER LOBE. Prior augmentation of an UPPER thoracic vertebral body. IMPRESSION: Interval resolution of the patchy ground-glass airspace opacities in both lungs since the CT 1 week ago. Stable mild changes of chronic bronchitis and/or asthma. No acute cardiopulmonary disease currently. Electronically Signed   By: Hulan Saashomas  Lawrence M.D.   On: 09/30/2018 15:12     ____________________________________________   PROCEDURES  Procedure(s) performed:None Procedures Critical Care performed: yes  CRITICAL CARE Performed by: Nita Sicklearolina Farren Nelles  ?  Total critical care time: 40 min  Critical care time was exclusive of separately billable procedures and treating other patients.  Critical care was necessary to treat or prevent imminent or life-threatening deterioration.  Critical care was time spent personally by me on the  following activities: development of treatment plan with patient and/or surrogate as well as nursing, discussions with consultants, evaluation of patient's response to treatment, examination of patient, obtaining history from patient or surrogate, ordering and performing treatments and interventions, ordering and review of laboratory studies, ordering and review of radiographic studies, pulse oximetry and re-evaluation of patient's condition.  ____________________________________________   INITIAL IMPRESSION / ASSESSMENT AND PLAN / ED COURSE  53 y.o. female with a history of COPD on 3 L nasal cannula, HFpEF, hypertension, hyperlipidemia, CAD who presents for evaluation of shortness of breath.  Patient arrives in severe respiratory distress with no new oxygen requirement but diffuse wheezing, grunting, and increased work of breathing.  She was placed immediately on BiPAP and started on Solu-Medrol and DuoNeb's.  Patient is currently on doxycycline and prednisone for recent admission for COPD exacerbation in the setting of pneumonia.  She has had no fevers at home and reports that her cough is improving.  Chest x-ray looks improved with no further infiltrates being seen.  White cells are elevated however unchanged from 4 days ago and patient is on prednisone so I would expect her white count to be elevated at this time.  Lactic acid is normal.  VBG is normal with a PCO2 of 62 and a pH of 7.39. Influenza negative.  Patient only received SCDs during recent admission for DVT prophylaxis and she is currently not on any anticoagulation therefore we will send patient for CT angiogram to rule out a PE as possible cause of patient's symptoms.  Anticipate admission to the hospitalist service.    _________________________ 5:44 PM on 09/30/2018 -----------------------------------------  Patient remains on BiPAP.  CTA was done to rule out PE and that is negative for PE or pneumonia or pulmonary edema.  Discussed with  Hospitalist for admission   As part of my medical decision making, I reviewed the following data within the electronic MEDICAL RECORD NUMBER Nursing notes reviewed and incorporated, Labs reviewed , EKG interpreted , Old EKG reviewed, Old chart reviewed, Radiograph reviewed , Discussed with admitting physician , Notes from prior ED visits and [place] Controlled Substance Database    Pertinent labs & imaging results that were available during my care of the patient were  reviewed by me and considered in my medical decision making (see chart for details).    ____________________________________________   FINAL CLINICAL IMPRESSION(S) / ED DIAGNOSES  Final diagnoses:  Acute on chronic respiratory failure, unspecified whether with hypoxia or hypercapnia (HCC)  COPD exacerbation (HCC)      NEW MEDICATIONS STARTED DURING THIS VISIT:  ED Discharge Orders    None       Note:  This document was prepared using Dragon voice recognition software and may include unintentional dictation errors.    Don Perking, [place], MD 09/30/18 1745

## 2018-09-30 NOTE — ED Notes (Signed)
Patient transported to CT 

## 2018-09-30 NOTE — ED Notes (Signed)
Pt requesting pain medication, EDP informed. No further orders. Pt informed.

## 2018-09-30 NOTE — Consult Note (Signed)
Name: Caitlyn Evans MRN: [id number] DOB: Aug 29, 1966    ADMISSION DATE:  09/30/2018 CONSULTATION DATE: 09/30/2018  REFERRING MD : Dr. Amado CoeGouru  CHIEF COMPLAINT: Shortness of Breath   BRIEF PATIENT DESCRIPTION:  53 yo female admitted with acute on chronic hypercapnic respiratory failure secondary to AECOPD requiring Bipap.  Pt also with new T5 Compression fracture s/p fall.  SIGNIFICANT EVENTS/STUDIES:  01/15-Pt admitted to the stepdown unit on Bipap  CTA Chest 1/15>>Negative for acute pulmonary embolus. Significant interval improvement in multifocal airspace opacities compared to 09/23/2018. CT findings are most consistent with largely resolved multifocal pneumonia with only trace residual scarring and small pneumatoceles. New/progressed T5 compression fracture now with approximately 60% height loss. Stable remote T6 and T12 compression fractures with evidence of prior cement augmentation at T6. 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   HISTORY OF PRESENT ILLNESS:   This is a 53 yo female with a PMH of Sternal Fracture, Seasonal Allergies, MI, Lung Mass, HTN, GERD, Hypercholesteremia, Emphysema, 3L O2 via Nasal Canula, Current Everyday Smoker, CHF, Dysphagia, BOOP, Asthma, and Anxiety.  She presented to [facility] ER on 01/15 with c/o shortness of breath and wheezing.  She was recently discharged 09/26/2018 following treatment of AECOPD and multifocal pneumonia, she was discharged on clindamycin.  Per ER notes the pts shortness of breath progressively worsened since discharge and became severe today prompting ER visit.  In the ER she was placed on Bipap due to respiratory failure.  Lab results revealed wbc 16.1, lactic acid 1.48, troponin <0.03, and CXR revealed resolution of patchy ground-glass airspace opacities in bilateral lungs.  CTA Chest negative for acute PE and significant interval improvement in multifocal airspace opacities compared to CT Chest findings on 09/23/2018.  She was  subsequently admitted to the stepdown unit for additional workup and treatment.  PCCM is consulted for further management.  PAST MEDICAL HISTORY :   has a past medical history of Anxiety, Asthma, BOOP (bronchiolitis obliterans with organizing pneumonia) (HCC), CHF (congestive heart failure) (HCC), Dysphagia, pharyngoesophageal phase (05/30/2015), Emphysema lung (HCC) (05/30/2015), GERD (gastroesophageal reflux disease), Hypercholesterolemia, Hypertension, Lung mass, Migraines, Motion sickness, Myocardial infarction (HCC) (2012), Respiratory failure with hypercapnia (HCC) (08/2018), Seasonal allergies, Shortness of breath dyspnea, and Sternal fracture.  has a past surgical history that includes Vaginal hysterectomy; Lung surgery (Left); Oophorectomy (Left); Colonoscopy with propofol (N/A, 06/02/2015); Esophagogastroduodenoscopy (egd) with propofol (N/A, 06/02/2015); Esophagogastroduodenoscopy (egd) with propofol (N/A, 11/05/2017); and Kyphoplasty (N/A, 09/17/2018). Prior to Admission medications   Medication Sig Start Date End Date Taking? Authorizing Provider  ALPRAZolam Prudy Feeler(XANAX) 0.5 MG tablet Take 0.5 mg by mouth 3 (three) times daily.    Yes [provider]  clindamycin (CLEOCIN) 300 MG capsule Take 1 capsule (300 mg total) by mouth 3 (three) times daily for 5 days. 09/26/18 10/01/18 Yes Shaune Pollackhen, Qing, MD  diltiazem (CARDIZEM CD) 180 MG 24 hr capsule Take 1 capsule (180 mg total) by mouth daily. 11/26/17  Yes Enid BaasKalisetti, Radhika, MD  donepezil (ARICEPT) 10 MG tablet Take 10 mg by mouth daily.    Yes [provider]  furosemide (LASIX) 20 MG tablet Take 1 tablet (20 mg total) by mouth daily. 11/26/17  Yes Enid BaasKalisetti, Radhika, MD  guaiFENesin (ROBITUSSIN) 100 MG/5ML SOLN Take 5 mLs (100 mg total) by mouth every 4 (four) hours as needed for cough or to loosen phlegm. 09/26/18  Yes Shaune Pollackhen, Qing, MD  ipratropium-albuterol (DUONEB) 0.5-2.5 (3) MG/3ML SOLN Inhale 3 mLs into the lungs every 4 (four) hours as  needed (for wheezing/shortness of breath). Patient taking differently: Inhale 3 mLs into the lungs every 4 (four) hours as needed. For wheezing/shortness of breath 05/13/16  Yes Enid Baas, MD  metoprolol tartrate (LOPRESSOR) 25 MG tablet Take 1 tablet (25 mg total) by mouth 2 (two) times daily. 11/25/17  Yes Enid Baas, MD  oxyCODONE-acetaminophen (PERCOCET) 5-325 MG tablet Take 1 tablet by mouth every 6 (six) hours as needed for severe pain. 09/17/18 09/17/19 Yes Kennedy Bucker, MD  pantoprazole (PROTONIX) 40 MG tablet TAKE ONE TABLET BY MOUTH TWICE DAILY Patient taking differently: Take 40 mg by mouth 2 (two) times daily.  04/03/18  Yes Melodie Bouillon B, MD  rosuvastatin (CRESTOR) 10 MG tablet Take 10 mg by mouth daily.  10/10/17  Yes [provider]  tamsulosin (FLOMAX) 0.4 MG CAPS capsule Take 1 capsule (0.4 mg total) by mouth daily. 11/26/17  Yes Enid Baas, MD  predniSONE (DELTASONE) 10 MG tablet 30 mg po daily 1 day, 20 mg po daily 1 day, 10 mg po daily 1 day. Patient not taking: Reported on 09/30/2018 09/26/18   Shaune Pollack, MD   Allergies  Allergen Reactions  . Codeine Hives, Nausea And Vomiting and Nausea Only  . Penicillins Hives and Other (See Comments)    Has patient had a PCN reaction causing immediate rash, facial/tongue/throat swelling, SOB or lightheadedness with hypotension: No Has patient had a PCN reaction causing severe rash involving mucus membranes or skin necrosis: No Has patient had a PCN reaction that required hospitalization No Has patient had a PCN reaction occurring within the last 10 years: No If all of the above answers are "NO", then may proceed with Cephalosporin use. Other reaction(s): Other (See Comments) Has patient had a PCN    FAMILY HISTORY:  family history includes COPD in her father and mother; Heart attack in her mother; Stroke in her father. SOCIAL HISTORY:  reports that she has quit smoking. Her smoking use included  cigarettes. She has a 30.00 pack-year smoking history. She has never used smokeless tobacco. She reports that she does not drink alcohol or use drugs.  REVIEW OF SYSTEMS: Positives in BOLD  Constitutional: Negative for fever, chills, weight loss, malaise/fatigue and diaphoresis.  HENT: Negative for hearing loss, ear pain, nosebleeds, congestion, sore throat, neck pain, tinnitus and ear discharge.   Eyes: Negative for blurred vision, double vision, photophobia, pain, discharge and redness.  Respiratory: cough, hemoptysis, sputum production, shortness of breath, wheezing and stridor.   Cardiovascular: Negative for chest pain, palpitations, orthopnea, claudication, leg swelling and PND.  Gastrointestinal: Negative for heartburn, nausea, vomiting, abdominal pain, diarrhea, constipation, blood in stool and melena.  Genitourinary: Negative for dysuria, urgency, frequency, hematuria and flank pain.  Musculoskeletal: Negative for myalgias, back pain, joint pain and falls.  Skin: Negative for itching and rash.  Neurological: Negative for dizziness, tingling, tremors, sensory change, speech change, focal weakness, seizures, loss of consciousness, weakness and headaches.  Endo/Heme/Allergies: Negative for environmental allergies and polydipsia. Does not bruise/bleed easily.  SUBJECTIVE:  Pt reports that she is still short of breath and wheezing, but much improved since in the ED She report back pain and swelling to bilateral LE Denies fever, chills, cough On BiPAP Wants to eat  VITAL SIGNS: Temp:  [98.2 F (36.8 C)] 98.2 F (36.8 C) (01/15 1444) Pulse Rate:  [59-90] 74 (01/15 1900) Resp:  [13-36] 13 (01/15 1900) BP: (123-168)/(87-114) 129/87 (01/15 1900) SpO2:  [100 %] 100 % (01/15 1900) FiO2 (%):  [35 %] 35 % (  01/15 1745) Weight:  [52.2 kg] 52.2 kg (01/15 1442)  PHYSICAL EXAMINATION: General:  Acute on chronically ill appearing female, laying in bed, on BiPAP, mild respiratory  distress Neuro:  Awake, A&O x4, no focal deficits, speech clear HEENT:  Atraumatic, normocephalic, neck supple, no JVD Cardiovascular:  RRR, s1s2, no M/R/G, 2+ pulses throughout Lungs:  Expiratory wheezing throughout, even, slight increased work of breathing and assessory muscle use, BiPAP assisted Abdomen:  Soft, nontender, nondistended, no guarding or rebound tenderness, BS+ x4 Musculoskeletal:  Normal bulk and tone, no deformities, 1+ edema bilateral LE Skin:  Warm/dry.  Bruising to bilateral LE, No obvious rashes, lesions, or ulcerations  Recent Labs  Lab 09/25/18 0248 09/26/18 0354 09/30/18 1525  NA 141 140 136  K 4.0 3.7 3.9  CL 100 98 96*  CO2 34* 35* 30  BUN 29* 26* 16  CREATININE 0.62 0.53 0.53  GLUCOSE 122* 105* 133*   Recent Labs  Lab 09/25/18 0248 09/26/18 0354 09/30/18 1525  HGB 10.1* 11.2* 12.1  HCT 34.5* 37.4 39.2  WBC 24.0* 16.7* 16.1*  PLT 270 299 354   Dg Chest 2 View  Result Date: 09/30/2018 CLINICAL DATA:  Acute onset of LEFT-sided chest pain and shortness of breath that began today. Patient recently discharged from the hospital after treatment for multifocal pneumonia. Prior LEFT UPPER LOBE partial resection. EXAM: CHEST - 2 VIEW COMPARISON:  CT chest 09/23/2018 and earlier. Chest x-rays 09/22/2018 and earlier. FINDINGS: AP ERECT and LATERAL images were obtained. Interval resolution of the patchy ground-glass airspace opacities in both lungs since the CT 1 week ago. Biapical pleuroparenchymal scarring and LEFT basilar pleuroparenchymal scarring, unchanged. Mild central peribronchial thickening, unchanged. Lungs otherwise clear. No confluent airspace consolidation currently. No pleural effusions. Normal pulmonary vascularity. Surgical suture material in the LEFT UPPER LOBE. Prior augmentation of an UPPER thoracic vertebral body. IMPRESSION: Interval resolution of the patchy ground-glass airspace opacities in both lungs since the CT 1 week ago. Stable mild changes  of chronic bronchitis and/or asthma. No acute cardiopulmonary disease currently. Electronically Signed   By: Hulan Saas M.D.   On: 09/30/2018 15:12   Ct Angio Chest Pe W And/or Wo Contrast  Result Date: 09/30/2018 CLINICAL DATA:  53 year old female with shortness of breath and chest pain, labored breathing and wheezing. Recently discharged with pneumonia. Suspect pulmonary embolism, high pretest probability. EXAM: CT ANGIOGRAPHY CHEST WITH CONTRAST TECHNIQUE: Multidetector CT imaging of the chest was performed using the standard protocol during bolus administration of intravenous contrast. Multiplanar CT image reconstructions and MIPs were obtained to evaluate the vascular anatomy. CONTRAST:  69mL OMNIPAQUE IOHEXOL 350 MG/ML SOLN COMPARISON:  Recent CT scan of the chest 09/23/2018 FINDINGS: Cardiovascular: Adequate opacification of the pulmonary arteries to the proximal segmental level. No evidence of central filling defect to suggest acute pulmonary embolus. The main pulmonary artery is normal in size. Normal caliber thoracic aorta with conventional 3 vessel arch anatomy. No evidence of dissection. The heart is normal in size. No pericardial effusion. Mediastinum/Nodes: Unremarkable CT appearance of the thyroid gland. No suspicious mediastinal or hilar adenopathy. No soft tissue mediastinal mass. The thoracic esophagus is unremarkable. Lungs/Pleura: Biapical pleuroparenchymal scarring again noted. Multifocal areas of prior consolidative airspace opacity are resolving leaving multiple small cystic spaces behind. The findings are most consistent with resolving pneumonia. No new focal airspace opacity is identified. No evidence of pulmonary edema. No pleural effusion or pneumothorax. Upper Abdomen: No acute abnormality. Musculoskeletal: New/progressive T5 vertebral fracture with approximately 60% height loss.  Evidence of prior T6 compression fracture status post cement augmentation. Chronic T12 superior  endplate compression fracture without significant interval change. Less than 20% height loss. 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 Review of the MIP images confirms the above findings. IMPRESSION: 1. Negative for acute pulmonary embolus. 2. Significant interval improvement in multifocal airspace opacities compared to 09/23/2018. CT findings are most consistent with largely resolved multifocal pneumonia with only trace residual scarring and small pneumatoceles. 3. New/progressed T5 compression fracture now with approximately 60% height loss. 4. Stable remote T6 and T12 compression fractures with evidence of prior cement augmentation at T6. 5. 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 Electronically Signed   By: Malachy MoanHeath  McCullough M.D.   On: 09/30/2018 17:47    ASSESSMENT / PLAN:  Acute on chronic hypercapnic respiratory failure secondary to AECOPD  Hx: Aspiration Pneumonia (dx 09/22/2018), Lung Mass, Asthma, Current Everyday Smoker, and BOOP Prn Bipap for dyspnea and/or hypoxia Follow intermittent CXR and ABG as needed Scheduled and prn bronchodilator therapy IV and nebulized steroids  Smoking cessation counseling provided Trend WBC and monitor fever curve  Will check PCT  Continue clindamycin   Anxiety Continue outpatient xanax  New T5 Compression Fracture s/p Fall  -Prn Morphine for pain control -Consult Orthopedics, appreciate input   DISPOSITION: Stepdown GOALS OF CARE: Full code VTE PROPHYLAXIS: Lovenox SQ UPDATES: Updated pt and her daughter at bedside 09/30/18.   Harlon DittyJeremiah , AGACNP-BC [facility] Pager: [phone] Cell: [phone]

## 2018-09-30 NOTE — Patient Outreach (Addendum)
[facility]) Care Management  09/30/2018  Caitlyn Evans [birth year] [id number]    EMMI-GENERAL DISCHARGE #2 ATTEMPT  RED ON EMMI ALERT Day # 1 Date: 09/28/18  10:00 AM Red Alert Reason: "Questions about discharge papers? Yes", "Unfilled prescriptions? Yes"  Outreach attempt # 2 unsuccessful. No answer. RN CM left HIPAA compliant voicemail message along with contact info.  Plan: RN CM will make 1 final outreach attempt to patient within 3-4 business days.    Delsa Sale, RN,CCM [facility] Care Management Care Management Coordinator Direct Phone: [phone] Toll Free: [phone] Fax: [phone]

## 2018-09-30 NOTE — ED Notes (Signed)
Pt has taken off Bipap mask because she said she wanted her daughter to brush her hair and put it up. Pt advised that mask should be keep off but pt took mask.

## 2018-09-30 NOTE — ED Triage Notes (Addendum)
FIRST NURSE NOTE-pt here for [facility]. Labored at check in. Wears O2 at baseline. Multiple admissions since December for COPD

## 2018-10-01 ENCOUNTER — Inpatient Hospital Stay: Payer: Medicare Other

## 2018-10-01 ENCOUNTER — Other Ambulatory Visit: Payer: Self-pay

## 2018-10-01 LAB — CBC
HCT: 33.6 % — ABNORMAL LOW (ref 36.0–46.0)
Hemoglobin: 10.1 g/dL — ABNORMAL LOW (ref 12.0–15.0)
MCH: 26.3 pg (ref 26.0–34.0)
MCHC: 30.1 g/dL (ref 30.0–36.0)
MCV: 87.5 fL (ref 80.0–100.0)
Platelets: 325 10*3/uL (ref 150–400)
RBC: 3.84 MIL/uL — ABNORMAL LOW (ref 3.87–5.11)
RDW: 17.7 % — ABNORMAL HIGH (ref 11.5–15.5)
WBC: 8.9 10*3/uL (ref 4.0–10.5)
nRBC: 0 % (ref 0.0–0.2)

## 2018-10-01 LAB — BASIC METABOLIC PANEL
Anion gap: 6 (ref 5–15)
BUN: 19 mg/dL (ref 6–20)
CO2: 33 mmol/L — ABNORMAL HIGH (ref 22–32)
Calcium: 8.4 mg/dL — ABNORMAL LOW (ref 8.9–10.3)
Chloride: 98 mmol/L (ref 98–111)
Creatinine, Ser: 0.7 mg/dL (ref 0.44–1.00)
GFR calc Af Amer: >60 mL/min (ref >60)
Glucose, Bld: 177 mg/dL — ABNORMAL HIGH (ref 70–99)
Potassium: 4 mmol/L (ref 3.5–5.1)
Sodium: 137 mmol/L (ref 135–145)

## 2018-10-01 MED ORDER — POLYETHYLENE GLYCOL 3350 17 G PO PACK
17.0000 g | PACK | Freq: Every day | ORAL | Status: DC | PRN
  Administered 2018-10-01 – 2018-10-03 (×3): 17 g via ORAL
  Filled 2018-10-01 (×3): qty 1

## 2018-10-01 MED ORDER — PROPOFOL 10 MG/ML IV BOLUS
INTRAVENOUS | Status: AC
  Filled 2018-10-01: qty 20

## 2018-10-01 MED ORDER — MORPHINE SULFATE (PF) 2 MG/ML IV SOLN
2.0000 mg | Freq: Once | INTRAVENOUS | Status: AC
  Administered 2018-10-01: 2 mg via INTRAVENOUS
  Filled 2018-10-01: qty 1

## 2018-10-01 MED ORDER — VITAMIN C 500 MG PO TABS
500.0000 mg | ORAL_TABLET | Freq: Two times a day (BID) | ORAL | Status: DC
  Administered 2018-10-01 – 2018-10-05 (×8): 500 mg via ORAL
  Filled 2018-10-01 (×10): qty 1

## 2018-10-01 MED ORDER — DOCUSATE SODIUM 100 MG PO CAPS
100.0000 mg | ORAL_CAPSULE | Freq: Two times a day (BID) | ORAL | Status: DC
  Administered 2018-10-01 – 2018-10-05 (×9): 100 mg via ORAL
  Filled 2018-10-01 (×8): qty 1

## 2018-10-01 MED ORDER — ADULT MULTIVITAMIN W/MINERALS CH
1.0000 | ORAL_TABLET | Freq: Every day | ORAL | Status: DC
  Administered 2018-10-02 – 2018-10-05 (×4): 1 via ORAL
  Filled 2018-10-01 (×4): qty 1

## 2018-10-01 NOTE — Progress Notes (Signed)
Simple spirometry performed at bedside: FVC  = 50% FEV1 =  28%

## 2018-10-01 NOTE — Progress Notes (Signed)
Initial Nutrition Assessment  DOCUMENTATION CODES:   Not applicable  INTERVENTION:  Patient reports she does not need any ONS.  Encouraged adequate intake of calories and protein at meals/snacks to meet elevated needs from catabolic nature of COPD.  Encouraged patient to divide her vitamin C into two doses for better absorption. Provide vitamin C 500 mg BID. She has increased vitamin C needs in setting of ongoing smoking, plus has a low intake of fruits/vegetables. Concerned about vitamin C deficiency.  Also provide MVI daily.  NUTRITION DIAGNOSIS:   Increased nutrient needs related to catabolic illness(COPD, CHF) as evidenced by estimated needs.  GOAL:   Patient will meet greater than or equal to 90% of their needs  MONITOR:   PO intake, Labs, Weight trends, Skin, I & O's  REASON FOR ASSESSMENT:   Consult COPD Protocol  ASSESSMENT:   53 year old female with PMHx of anxiety, asthma, HTN, CHF, emphysema of lung, GERD, COPD, ongoing smoking, BOOP who is admitted after a fall with T5 compression fracture, acute exacerbation of COPD.   Met with patient at bedside. She is known to this RD from an assessment in 11/2017. She reports her appetite is unchanged from baseline. She reports that she is on chronic steroids so she "eats a lot." At home she typically eats one meal per day (dinner; meat with potatoes) and then snacks the rest of the day. Her snacks are usually sweets such as pudding or chocolate-covered pretzels (not adding any protein/nutritional value). She enjoys the regular diet here but does not like to be placed on heart healthy diet. She reports she chooses a serving of protein at each meal here. She is very adamant that she does not want an oral nutrition supplement because she does not want to gain weight. Encouraged patient to choose adequate protein at meals AND snacks to meet increased needs with COPD. Noted ecchymosis to bilateral arms and legs. Patient reports she  takes vitamin C at home 1000 mg per day (takes 2 500 mg tablets at the same time). She is also on vitamin D and vitamin B12.   Patient reports she is weight-stable at around 110-115 lbs. Denies any weight loss recently or over the past year.  Meal Completion: 75% of breakfast this morning  Medications reviewed and include: Xanax 0.5 mg TID, Colace 100 mg BID, Lasix 20 mg daily, Solu-Medrol 60 mg Q6hrs IV, pantoprazole, Flomax, clindamycin.  Labs reviewed: CBG 103, CO2 33.  Patient does not meet criteria for malnutrition at this time.  NUTRITION - FOCUSED PHYSICAL EXAM:    Most Recent Value  Orbital Region  No depletion  Upper Arm Region  Mild depletion  Thoracic and Lumbar Region  No depletion  Buccal Region  No depletion  Temple Region  No depletion  Clavicle Bone Region  No depletion  Clavicle and Acromion Bone Region  No depletion  Scapular Bone Region  No depletion  Dorsal Hand  No depletion  Patellar Region  Moderate depletion  Anterior Thigh Region  Moderate depletion  Posterior Calf Region  Moderate depletion  Edema (RD Assessment)  None  Hair  Reviewed  Eyes  Reviewed  Mouth  Reviewed  Skin  Reviewed [scattered ecchymosis to bilateral upper and lower extremities]  Nails  Reviewed     Diet Order:   Diet Order            Diet regular Room service appropriate? Yes; Fluid consistency: Thin  Diet effective now  EDUCATION NEEDS:   Education needs have been addressed  Skin:  Skin Assessment: Reviewed RN Assessment(ecchymosis)  Last BM:  09/30/2018 per chart  Height:   Ht Readings from Last 1 Encounters:  09/30/18 4' 10" (1.473 m)   Weight:   Wt Readings from Last 1 Encounters:  09/30/18 52.2 kg   Ideal Body Weight:  43.9 kg  BMI:  Body mass index is 24.04 kg/m.  Estimated Nutritional Needs:   Kcal:  1400-1600  Protein:  65-75 grams  Fluid:  1.4-1.6 L/day  Willey Blade, MS, RD, LDN Office: [phone] Pager:  [phone] After Hours/Weekend Pager: [phone]

## 2018-10-01 NOTE — Patient Outreach (Signed)
[facility]) Care Management  10/01/2018  Ticia KEGAN PIERROT 1966/06/21 [id number]   EMMI-GENERAL DISCHARGE #3 ATTEMPT  RED ON EMMI ALERT Day #1 Date:09/28/18 10:00 AM Red Alert Reason:"Questions about discharge papers? Yes", "Unfilled prescriptions? Yes"   No outreach attempted. Noted patient was readmitted on 09/30/18 to [facility].   Plan: RN CM will close case due to patient has been readmitted.       Delsa Sale, RN,CCM [facility] Care Management Care Management Coordinator Direct Phone: [phone] Toll Free: [phone] Fax: [phone]

## 2018-10-01 NOTE — Progress Notes (Signed)
Patient wore bipap most of the night, when RT put her back on the mask after she finished her meal brought in by her family. Called NP to see if patient can return to the 3L per nasal cannula per patient's request. NP said it was okay to try patient on nasal cannula.

## 2018-10-01 NOTE — Progress Notes (Signed)
[employer] [facility] [place] [facility]   PATIENT NAME: Caitlyn Evans    MR#:  [id number]  DATE OF BIRTH:  1966-08-04  SUBJECTIVE:  CHIEF COMPLAINT:   Chief Complaint  Patient presents with  . Shortness of Breath   -Patient was recently in the hospital 1 week ago for COPD exacerbation and right-sided pneumonia- - had T6 compression fracture kyphoplasty done 2 weeks ago. -Goes home, comes after a fall and shortness of breath.  Now has T5 compression fracture. -Off BiPAP.  Back on 3 L oxygen which is chronic home O2.  Complains of significant upper back pain  REVIEW OF SYSTEMS:  Review of Systems  Constitutional: Negative for chills, fever and malaise/fatigue.  HENT: Negative for congestion, ear discharge, hearing loss and nosebleeds.   Eyes: Negative for blurred vision and double vision.  Respiratory: Positive for shortness of breath and wheezing. Negative for cough.   Cardiovascular: Negative for chest pain and palpitations.  Gastrointestinal: Negative for abdominal pain, constipation, diarrhea, nausea and vomiting.  Genitourinary: Negative for dysuria.  Musculoskeletal: Positive for back pain.  Neurological: Negative for dizziness, focal weakness, seizures, weakness and headaches.  Psychiatric/Behavioral: Negative for depression.    DRUG ALLERGIES:   Allergies  Allergen Reactions  . Codeine Hives, Nausea And Vomiting and Nausea Only  . Penicillins Hives and Other (See Comments)    Has patient had a PCN reaction causing immediate rash, facial/tongue/throat swelling, SOB or lightheadedness with hypotension: No Has patient had a PCN reaction causing severe rash involving mucus membranes or skin necrosis: No Has patient had a PCN reaction that required hospitalization No Has patient had a PCN reaction occurring within the last 10 years: No If all of the above answers are "NO", then may proceed with Cephalosporin use. Other reaction(s): Other (See Comments) Has  patient had a PCN    VITALS:  Blood pressure 130/68, pulse 72, temperature 98.2 F (36.8 C), temperature source Oral, resp. rate 19, height 4\' 10"  (1.473 m), weight 52.2 kg, SpO2 99 %.  PHYSICAL EXAMINATION:  Physical Exam  GENERAL:  53 y.o.-year-old patient lying in the bed, low back pain secondary to fracture. EYES: Pupils equal, round, reactive to light and accommodation. No scleral icterus. Extraocular muscles intact.  HEENT: Head atraumatic, normocephalic. Oropharynx and nasopharynx clear.  NECK:  Supple, no jugular venous distention. No thyroid enlargement, no tenderness.  LUNGS: Moving air bilaterally, has occasional scattered wheezing, no rales,rhonchi or crepitation. No use of accessory muscles of respiration.  CARDIOVASCULAR: S1, S2 normal. No murmurs, rubs, or gallops.  ABDOMEN: Soft, nontender, nondistended. Bowel sounds present. No organomegaly or mass.  EXTREMITIES: No pedal edema, cyanosis, or clubbing.  NEUROLOGIC: Cranial nerves II through XII are intact. Muscle strength 5/5 in all extremities. Sensation intact. Gait not checked.  PSYCHIATRIC: The patient is alert and oriented x 3.  SKIN: No obvious rash, lesion, or ulcer.    LABORATORY PANEL:   CBC Recent Labs  Lab 10/01/18 0342  WBC 8.9  HGB 10.1*  HCT 33.6*  PLT 325   ------------------------------------------------------------------------------------------------------------------  Chemistries  Recent Labs  Lab 09/25/18 0248  10/01/18 0342  NA 141   < > 137  K 4.0   < > 4.0  CL 100   < > 98  CO2 34*   < > 33*  GLUCOSE 122*   < > 177*  BUN 29*   < > 19  CREATININE 0.62   < > 0.70  CALCIUM 8.9   < >  8.4*  MG 2.4  --   --    < > = values in this interval not displayed.   ------------------------------------------------------------------------------------------------------------------  Cardiac Enzymes Recent Labs  Lab 09/30/18 1525  TROPONINI <0.03    ------------------------------------------------------------------------------------------------------------------  RADIOLOGY:  Dg Chest 2 View  Result Date: 09/30/2018 CLINICAL DATA:  Acute onset of LEFT-sided chest pain and shortness of breath that began today. Patient recently discharged from the hospital after treatment for multifocal pneumonia. Prior LEFT UPPER LOBE partial resection. EXAM: CHEST - 2 VIEW COMPARISON:  CT chest 09/23/2018 and earlier. Chest x-rays 09/22/2018 and earlier. FINDINGS: AP ERECT and LATERAL images were obtained. Interval resolution of the patchy ground-glass airspace opacities in both lungs since the CT 1 week ago. Biapical pleuroparenchymal scarring and LEFT basilar pleuroparenchymal scarring, unchanged. Mild central peribronchial thickening, unchanged. Lungs otherwise clear. No confluent airspace consolidation currently. No pleural effusions. Normal pulmonary vascularity. Surgical suture material in the LEFT UPPER LOBE. Prior augmentation of an UPPER thoracic vertebral body. IMPRESSION: Interval resolution of the patchy ground-glass airspace opacities in both lungs since the CT 1 week ago. Stable mild changes of chronic bronchitis and/or asthma. No acute cardiopulmonary disease currently. Electronically Signed   By: Hulan Saashomas  Lawrence M.D.   On: 09/30/2018 15:12   Ct Angio Chest Pe W And/or Wo Contrast  Result Date: 09/30/2018 CLINICAL DATA:  [age] year old female with shortness of breath and chest pain, labored breathing and wheezing. Recently discharged with pneumonia. Suspect pulmonary embolism, high pretest probability. EXAM: CT ANGIOGRAPHY CHEST WITH CONTRAST TECHNIQUE: Multidetector CT imaging of the chest was performed using the standard protocol during bolus administration of intravenous contrast. Multiplanar CT image reconstructions and MIPs were obtained to evaluate the vascular anatomy. CONTRAST:  75mL OMNIPAQUE IOHEXOL 350 MG/ML SOLN COMPARISON:  Recent CT scan  of the chest 09/23/2018 FINDINGS: Cardiovascular: Adequate opacification of the pulmonary arteries to the proximal segmental level. No evidence of central filling defect to suggest acute pulmonary embolus. The main pulmonary artery is normal in size. Normal caliber thoracic aorta with conventional 3 vessel arch anatomy. No evidence of dissection. The heart is normal in size. No pericardial effusion. Mediastinum/Nodes: Unremarkable CT appearance of the thyroid gland. No suspicious mediastinal or hilar adenopathy. No soft tissue mediastinal mass. The thoracic esophagus is unremarkable. Lungs/Pleura: Biapical pleuroparenchymal scarring again noted. Multifocal areas of prior consolidative airspace opacity are resolving leaving multiple small cystic spaces behind. The findings are most consistent with resolving pneumonia. No new focal airspace opacity is identified. No evidence of pulmonary edema. No pleural effusion or pneumothorax. Upper Abdomen: No acute abnormality. Musculoskeletal: New/progressive T5 vertebral fracture with approximately 60% height loss. Evidence of prior T6 compression fracture status post cement augmentation. Chronic T12 superior endplate compression fracture without significant interval change. Less than 20% height loss. 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 Review of the MIP images confirms the above findings. IMPRESSION: 1. Negative for acute pulmonary embolus. 2. Significant interval improvement in multifocal airspace opacities compared to 09/23/2018. CT findings are most consistent with largely resolved multifocal pneumonia with only trace residual scarring and small pneumatoceles. 3. New/progressed T5 compression fracture now with approximately 60% height loss. 4. Stable remote T6 and T12 compression fractures with evidence of prior cement augmentation at T6. 5. 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 Electronically Signed   By:  Malachy MoanHeath  McCullough M.D.   On: 09/30/2018 17:47    EKG:   Orders placed or performed during the hospital encounter of 09/30/18  .  ED EKG  . ED EKG  . EKG 12-Lead  . EKG 12-Lead    ASSESSMENT AND PLAN:   53 year old female with past medical history significant for COPD on 3 L home oxygen, ongoing smoking, [place], CHF, GERD and hypertension presents to hospital secondary to a fall and shortness of breath.  1.  Acute hypoxic hypercarbic respiratory failure-secondary to COPD exacerbation-improved after BiPAP -Patient continues to smoke.  Counseled -Remains in ICU.  Now weaned off Bipap and on 3 L oxygen -IV steroids, nebs and inhalers at this time.  2.  Fall and T5 compression fracture-appreciate orthopedics consult - ortho or IR Not available for kyphoplasty at this time -MRI of the thoracic spine ordered.  Continue pain control and kyphoplasty as outpatient next week  3.  CHF-has diastolic dysfunction.  EF of 55 to 65% on recent echo -Well compensated.  Continue oral Lasix at this time.  4.  GERD-on Protonix  5.  DVT prophylaxis-Lovenox  6.  Hypertension history of sinus tachycardia-continue Lopressor and Cardizem   All the records are reviewed and case discussed with Care Management/Social Workerr. Management plans discussed with the patient, family and they are in agreement.  CODE STATUS: Full code  TOTAL TIME TAKING CARE OF THIS PATIENT: 38 minutes.   POSSIBLE D/C IN 1-2 DAYS, DEPENDING ON CLINICAL CONDITION.   Enid Baasadhika Aubrina Nieman M.D on 10/01/2018 at 9:06 AM  Between 7am to 6pm - Pager - [phone]  After 6pm go to [email or web address] - Social research officer, governmentpassword EPAS [facility]  [facility] [place] [facility]  Office  [phone]  CC: Primary care physician; Corky DownsMasoud, Javed, MD

## 2018-10-01 NOTE — Evaluation (Signed)
Physical Therapy Evaluation Patient Details Name: Caitlyn Evans MRN: [id number] DOB: 13-May-1966 Today's Date: 10/01/2018   History of Present Illness  Pt is a 53 year old female admitted for acute respiratory failure with hypoxia.  Hx of T6 kyphoplasty 09/17/2018 and pt also fell in her home 3 days ago and sustained a T5 fracture.  PMH includes anxiety, asthma, CHF, emphysema, MI and sternal fracture.  Clinical Impression  Pt is a 53 year old female who lives in a one story home with her husband.  She is independent without use of AD at baseline.  Pt in bed and reporting >10/10 pain in mid back which she described as radiating down to her feet.  Pt also presenting with bruising of LE's which she reports as a result of the fall she had before coming to the hospital.  She was open to education including log rolling and management of HEP but refused bed mobility at this time.  Pt demonstrated fair strength of LE's, limited by pain.  Pt will continue to benefit from skilled PT with focus on strength, tolerance to activity, pain management and safe functional mobility.  Pt appropriate for SNF placement following discharge due to decrease in functional mobility.    Follow Up Recommendations SNF    Equipment Recommendations  3in1 (PT)    Recommendations for Other Services       Precautions / Restrictions Precautions Precautions: Fall Restrictions Weight Bearing Restrictions: No      Mobility  Bed Mobility Overal bed mobility: Needs Assistance             General bed mobility comments: Pt refused to perform bed mobiltiy at this time but education was provided concerning log roll method to get to EOB.  Transfers                    Ambulation/Gait                Stairs            Wheelchair Mobility    Modified Rankin (Stroke Patients Only)       Balance                                             Pertinent Vitals/Pain Pain  Assessment: 0-10 Pain Score: 10-Worst pain ever Pain Location: m id back radiating to feet. Pain Descriptors / Indicators: Radiating    Home Living Family/patient expects to be discharged to:: Private residence Living Arrangements: Spouse/significant other;Children Available Help at Discharge: Family Type of Home: House Home Access: Stairs to enter Entrance Stairs-Rails: None Entrance Stairs-Number of Steps: 3 Home Layout: One level Home Equipment: Environmental consultant - 2 wheels;Cane - single point      Prior Function Level of Independence: Independent         Comments: Goes to gym to walk on treadmill     Hand Dominance        Extremity/Trunk Assessment   Upper Extremity Assessment Upper Extremity Assessment: Generalized weakness    Lower Extremity Assessment Lower Extremity Assessment: Generalized weakness(Limited by pain but demonstrated good ankle strength and AROM of LE's.)       Communication   Communication: No difficulties  Cognition Arousal/Alertness: Awake/alert Behavior During Therapy: WFL for tasks assessed/performed Overall Cognitive Status: Within Functional Limits for tasks assessed  General Comments      Exercises General Exercises - Lower Extremity Ankle Circles/Pumps: Strengthening;20 reps;Both;Supine Hip ABduction/ADduction: Strengthening;Both;10 reps;Supine Straight Leg Raises: Both;Supine;Strengthening;10 reps   Assessment/Plan    PT Assessment Patient needs continued PT services  PT Problem List Decreased strength;Decreased mobility;Pain;Impaired sensation;Decreased balance;Decreased activity tolerance;Decreased knowledge of use of DME       PT Treatment Interventions DME instruction;Functional mobility training;Balance training;Patient/family education;Stair training;Therapeutic exercise;Gait training;Therapeutic activities    PT Goals (Current goals can be found in the Care Plan  section)  Acute Rehab PT Goals Patient Stated Goal: To return to working out at the gym PT Goal Formulation: With patient Time For Goal Achievement: 10/15/18    Frequency     Barriers to discharge        Co-evaluation               AM-PAC PT "6 Clicks" Mobility  Outcome Measure Help needed turning from your back to your side while in a flat bed without using bedrails?: A Lot Help needed moving from lying on your back to sitting on the side of a flat bed without using bedrails?: A Lot Help needed moving to and from a bed to a chair (including a wheelchair)?: A Lot Help needed standing up from a chair using your arms (e.g., wheelchair or bedside chair)?: A Lot Help needed to walk in hospital room?: A Lot Help needed climbing 3-5 steps with a railing? : Total 6 Click Score: 11    End of Session Equipment Utilized During Treatment: Oxygen Activity Tolerance: Patient limited by pain Patient left: in bed;with call bell/phone within reach;with bed alarm set   PT Visit Diagnosis: History of falling (Z91.81);Difficulty in walking, not elsewhere classified (R26.2)    Time: 1103-1594 PT Time Calculation (min) (ACUTE ONLY): 20 min   Charges:   PT Evaluation $PT Eval Low Complexity: 1 Low PT Treatments $Therapeutic Exercise: 8-22 mins        Glenetta Hew, PT, DPT  Glenetta Hew 10/01/2018, 1:52 PM

## 2018-10-01 NOTE — Progress Notes (Signed)
OT Cancellation Note  Patient Details Name: Caitlyn Evans MRN: [id number] DOB: Aug 29, 1966   Cancelled Treatment:    Reason Eval/Treat Not Completed: Pain limiting ability to participate. Consult received, chart reviewed. Upon OT's arrival, pt tearful, reporting "25/10" pain in her thoracic spine with no relief with adjustments in positioning in bed. Pt politely declining, requesting pain meds. RN already notified and in room as OT left. OT provided pt with tissues, per her request. BP noted to be 84/56, 100/62 and HR 68-75 with multiform PVCs. Will re-attempt OT evaluation at later date/time as pt is medically appropriate.   Richrd Prime, MPH, MS, OTR/L ascom [phone] 10/01/18, 3:16 PM

## 2018-10-01 NOTE — Care Management Note (Signed)
Case Management Note  Patient Details  Name: Caitlyn Evans MRN: [id number] Date of Birth: 07/05/1966  Subjective/Objective:  Patient admitted with acute on chronic respiratory failure/ COPD exacerbation.  Patient initially required Bipap but is now tolerating [place].  Patient has chronic O2 2L with [facility].  Attempting to get patient approved for NIV at home.  Patient has frequent admissions 5 within the past month for COPD exacerbations.  Patient would like to have the NIV and is agreeable to home health services as well, in the past she has refused home health but she reports it was because she had a dog and now she doesn't.  Patient is from home and lives with her husband and Information systems managergrandaughter.  Patient has all needed equipment, rolling walker, cane, bedside commode.  Patient reports that she feels safe at home and it is the place she wants to be most.   Robbie LisJeanna Shannan Garfinkel RN BSN [phone]                   Action/Plan:   Expected Discharge Date:  10/02/18               Expected Discharge Plan:  Home w Home Health Services  In-House Referral:     Discharge planning Services  CM Consult  Post Acute Care Choice:  Durable Medical Equipment, Home Health Choice offered to:  Patient  DME Arranged:  Nebulizer machine, NIV DME Agency:  [facility]  HH Arranged:  RN, [facility]  [facility]  Status of Service:  In process, will continue to follow  If discussed at Long Length of Stay Meetings, dates discussed:    Additional Comments:  Allayne ButcherJeanna M Pacey Willadsen, RN 10/01/2018, 11:42 AM

## 2018-10-01 NOTE — Progress Notes (Signed)
Patient has severe end stage COPD with FEV1<50%   Patient continues to exhibit signs of hypercapnia associated with chronic respiratory failure secondary to severe end-stage COPD.    Patient is able to clear secretions and prptect airway.  Patient requires the use of NIV both nightly and daytime to help with exacerbation periods. The use of NIV will treat patient's high PCO2 levels and can reduce the risk of exacerbations in future hospitalizations when used at night and during the day.  Patient will need these advanced settings in conjunction with his current medication regimen :BiPAP with backup rate has been tried and failed.  Failure to have NIV available for use over 24  Could lead to severe respiratory failure and cardiaca arrest and death     Berna Gitto David Donna Snooks, M.D.  [facility]  Medical Director ICU-[facility] Medical Director [facility] Cardio-Pulmonary Department     

## 2018-10-02 ENCOUNTER — Encounter: Payer: Self-pay | Admitting: *Deleted

## 2018-10-02 LAB — BASIC METABOLIC PANEL
Anion gap: 3 — ABNORMAL LOW (ref 5–15)
BUN: 23 mg/dL — AB (ref 6–20)
CO2: 37 mmol/L — ABNORMAL HIGH (ref 22–32)
Calcium: 8.4 mg/dL — ABNORMAL LOW (ref 8.9–10.3)
Chloride: 100 mmol/L (ref 98–111)
Creatinine, Ser: 0.45 mg/dL (ref 0.44–1.00)
GFR calc Af Amer: >60 mL/min (ref >60)
GFR calc non Af Amer: >60 mL/min (ref >60)
Glucose, Bld: 153 mg/dL — ABNORMAL HIGH (ref 70–99)
POTASSIUM: 4.2 mmol/L (ref 3.5–5.1)
Sodium: 140 mmol/L (ref 135–145)

## 2018-10-02 LAB — CBC
HCT: 31.1 % — ABNORMAL LOW (ref 36.0–46.0)
Hemoglobin: 9.3 g/dL — ABNORMAL LOW (ref 12.0–15.0)
MCH: 26.3 pg (ref 26.0–34.0)
MCHC: 29.9 g/dL — ABNORMAL LOW (ref 30.0–36.0)
MCV: 87.9 fL (ref 80.0–100.0)
Platelets: 316 10*3/uL (ref 150–400)
RBC: 3.54 MIL/uL — ABNORMAL LOW (ref 3.87–5.11)
RDW: 17.7 % — ABNORMAL HIGH (ref 11.5–15.5)
WBC: 26.3 10*3/uL — ABNORMAL HIGH (ref 4.0–10.5)
nRBC: 0 % (ref 0.0–0.2)

## 2018-10-02 LAB — HIV ANTIBODY (ROUTINE TESTING W REFLEX): HIV Screen 4th Generation wRfx: NONREACTIVE

## 2018-10-02 MED ORDER — METHYLPREDNISOLONE SODIUM SUCC 125 MG IJ SOLR
60.0000 mg | Freq: Two times a day (BID) | INTRAMUSCULAR | Status: DC
  Administered 2018-10-02 – 2018-10-05 (×6): 60 mg via INTRAVENOUS
  Filled 2018-10-02 (×6): qty 2

## 2018-10-02 MED ORDER — SODIUM CHLORIDE 0.9 % IV SOLN
INTRAVENOUS | Status: DC | PRN
  Administered 2018-10-02: 250 mL via INTRAVENOUS

## 2018-10-02 MED ORDER — FENTANYL 12 MCG/HR TD PT72
1.0000 | MEDICATED_PATCH | TRANSDERMAL | Status: DC
  Administered 2018-10-02: 1 via TRANSDERMAL
  Filled 2018-10-02: qty 1

## 2018-10-02 NOTE — Evaluation (Signed)
Occupational Therapy Evaluation Patient Details Name: Caitlyn Evans MRN: [id number] DOB: 08-14-1966 Today's Date: 10/02/2018    History of Present Illness Pt is a 53 year old female admitted for acute respiratory failure with hypoxia.  Hx of T6 kyphoplasty 09/17/2018 and pt also fell in her home 3 days ago and sustained a T5 fracture.  PMH includes anxiety, asthma, CHF, emphysema, MI and sternal fracture.   Clinical Impression   Pt seen for OT evaluation this date. RN cleared OT to work with the pt. Prior to hospital admission, pt was modified independent, easily getting out of breath with ADL and mobility.  Pt lives with her spouse and granddaughter. Pt endorses "furniture cruising," holds her breath when she bends over and when she stands up, and often rushes/is "on the go." Pt eager to work with therapy, as she does not want to get weak, per pt report. Pt instructed in log roll for bed mobility to minimize back pain with pt able to return demo with CGA. Pt able to tolerate sitting EOB for >5 minutes with occasional cues for pursed lip breathing. Pt provided with toothbrush and able to perform oral care, requiring cues again to not hold her breath, with HR increasing from 60's to 142 with VTach briefly before decreasing back down to 60's. PT denied SOB but knew her "heart was working hard" during the task. Pt educated in energy conservation strategies including pursed lip breathing, activity pacing, home/routines modifications, work simplification, AE/DME, prioritizing of meaningful occupations, and falls prevention. Pt verbalized understanding but would benefit from additional skilled OT services to maximize recall and carryover of learned techniques and facilitate implementation of learned techniques into daily routines. Of note, per RN, there are plans to transfer pt [facility] for back surgery on Monday, 10/05/2018. Pt will likely need to be evaluated again after surgery for most appropriate discharge.    Follow Up Recommendations  SNF    Equipment Recommendations  Tub/shower bench;3 in 1 bedside commode    Recommendations for Other Services       Precautions / Restrictions Precautions Precautions: Fall Restrictions Weight Bearing Restrictions: No      Mobility Bed Mobility Overal bed mobility: Needs Assistance Bed Mobility: Rolling;Sidelying to Sit;Sit to Sidelying Rolling: Min guard Sidelying to sit: Min guard     Sit to sidelying: Min guard General bed mobility comments: MAX cues to initiate as pt slightly impulsive, instructed in log roll technique to support recall and carryover with pt reporting decreased back pain when using learned technique  Transfers Overall transfer level: Needs assistance Equipment used: None Transfers: Lateral/Scoot Transfers          Lateral/Scoot Transfers: Min guard      Balance Overall balance assessment: Needs assistance Sitting-balance support: Single extremity supported;No upper extremity supported;Feet unsupported Sitting balance-Leahy Scale: Fair                                     ADL either performed or assessed with clinical judgement   ADL Overall ADL's : Needs assistance/impaired Eating/Feeding: Sitting;Independent   Grooming: Sitting;Oral care;Supervision/safety;Set up Grooming Details (indicate cue type and reason): cues to not hold her breath, HR increasing from 60's to 142 briefly, VTach (RN notified) Upper Body Bathing: Sitting;Minimal assistance   Lower Body Bathing: Sitting/lateral leans;Moderate assistance   Upper Body Dressing : Sitting;Minimal assistance   Lower Body Dressing: Sitting/lateral leans;Moderate assistance  Toilet Transfer Details (indicate cue type and reason): deferred 2/2 HR, pt able to use bed pan independently including placement Toileting- Clothing Manipulation and Hygiene: Modified independent;Bed level               Vision Patient Visual Report: No change  from baseline       Perception     Praxis      Pertinent Vitals/Pain Pain Assessment: 0-10 Pain Score: 10-Worst pain ever Pain Location: mid back pain Pain Descriptors / Indicators: Radiating Pain Intervention(s): Limited activity within patient's tolerance;Monitored during session;Repositioned     Hand Dominance Right   Extremity/Trunk Assessment Upper Extremity Assessment Upper Extremity Assessment: Generalized weakness   Lower Extremity Assessment Lower Extremity Assessment: Generalized weakness   Cervical / Trunk Assessment Cervical / Trunk Assessment: Other exceptions Cervical / Trunk Exceptions: acute T5 fx   Communication Communication Communication: No difficulties   Cognition Arousal/Alertness: Awake/alert Behavior During Therapy: WFL for tasks assessed/performed Overall Cognitive Status: Within Functional Limits for tasks assessed                                 General Comments: mildly impulsive, requires cues   General Comments  HR generally in 60's-70's, up to 142 briefly w/ VTach while sitting EOB brushing her teeth. RN notified.    Exercises Other Exercises Other Exercises: pt educated in energy conservation strategies including home/routines modifications, work simplification, positioning, activity pacing, pursed lip breathing, AE/DME, and falls prevention strategies to support independence with ADL and IADL While minimizing risk of SOB/COPD exacerbation and falls. Other Exercises: pt instructed in log roll technique for bed mobility to improve independence while minimizing  back pain   Shoulder Instructions      Home Living Family/patient expects to be discharged to:: Private residence Living Arrangements: Spouse/significant other;Children Available Help at Discharge: Family Type of Home: House Home Access: Stairs to enter Secretary/administrator of Steps: 3 Entrance Stairs-Rails: None Home Layout: One level     Bathroom  Shower/Tub: IT trainer: Standard Bathroom Accessibility: Yes   Home Equipment: Environmental consultant - 2 wheels;Cane - single point;Shower seat;Grab bars - tub/shower;Wheelchair - manual   Additional Comments: pt reports family is going to get a toilet riser      Prior Functioning/Environment Level of Independence: Independent        Comments: Goes to gym to walk on treadmill; endorses "furniture cruising"        OT Problem List: Decreased strength;Decreased knowledge of use of DME or AE;Decreased knowledge of precautions;Decreased activity tolerance;Cardiopulmonary status limiting activity;Pain;Impaired balance (sitting and/or standing);Decreased safety awareness      OT Treatment/Interventions: Self-care/ADL training;Balance training;Therapeutic exercise;Therapeutic activities;DME and/or AE instruction;Energy conservation;Patient/family education    OT Goals(Current goals can be found in the care plan section) Acute Rehab OT Goals Patient Stated Goal: To return to working out at the gym OT Goal Formulation: With patient Time For Goal Achievement: 10/16/18 Potential to Achieve Goals: Good ADL Goals Pt Will Perform Lower Body Dressing: with supervision;sit to/from stand;with adaptive equipment(using learned ECS and AE, <7/10 low back pain) Pt Will Transfer to Toilet: with min guard assist;bedside commode;ambulating(LRAD for amb, using learned ECS, <7/10 back pain) Additional ADL Goal #1: Pt will perform bed mobility with modified independence using learned ECS and reporting <7/10 back pain Additional ADL Goal #2: Pt will verbalize plan to implement at least 3 learned ECS into daily ADL and IADL routine within  home environment to minimize SOB/COPD exacerbation and maximize safety/independence.  OT Frequency: Min 2X/week   Barriers to D/C:            Co-evaluation              AM-PAC OT "6 Clicks" Daily Activity     Outcome Measure Help from another  person eating meals?: None Help from another person taking care of personal grooming?: A Little Help from another person toileting, which includes using toliet, bedpan, or urinal?: A Lot Help from another person bathing (including washing, rinsing, drying)?: A Lot Help from another person to put on and taking off regular upper body clothing?: A Little Help from another person to put on and taking off regular lower body clothing?: A Lot 6 Click Score: 16   End of Session Equipment Utilized During Treatment: Oxygen Nurse Communication: Other (comment)(HR during session)  Activity Tolerance: Patient tolerated treatment well Patient left: in bed;with call bell/phone within reach  OT Visit Diagnosis: Other abnormalities of gait and mobility (R26.89);Muscle weakness (generalized) (M62.81);Pain Pain - part of body: (mid-back pain)                Time: 5320-2334 OT Time Calculation (min): 41 min Charges:  OT General Charges $OT Visit: 1 Visit OT Evaluation $OT Eval Moderate Complexity: 1 Mod OT Treatments $Self Care/Home Management : 8-22 mins  Richrd Prime, MPH, MS, OTR/L ascom [phone] 10/02/18, 2:45 PM

## 2018-10-02 NOTE — Progress Notes (Addendum)
Physical Therapy Treatment Patient Details Name: Caitlyn Evans MRN: [id number] DOB: May 26, 1966 Today's Date: 10/02/2018    History of Present Illness Pt is a 52 year old female admitted for acute respiratory failure with hypoxia.  Hx of T6 kyphoplasty 09/17/2018 and pt also fell in her home 3 days ago and sustained a T5 fracture.  PMH includes anxiety, asthma, CHF, emphysema, MI and sternal fracture.    PT Comments    Pt in bed, resting, motivated to participate. Pt very excited to get OOB and AMB as she has restless leg syndrome. Education reviewed for log rolling. Pt transferring with RW and AMB 160+ft with RW at minGuard assist. SpO2 remains stable on 3L/min and pain remains well controlled. Pt refuses OOB to chair at end of session, fearful it will make her back pain hurt worse. Pt progressing well, with recommendations updated to home with home health.    Follow Up Recommendations  Home health PT     Equipment Recommendations  3in1 (PT)    Recommendations for Other Services       Precautions / Restrictions Precautions Precautions: Fall;Back Precaution Comments: T5 fracture, log rolls and avoidance of trunk flexion Restrictions Weight Bearing Restrictions: No    Mobility  Bed Mobility Overal bed mobility: Needs Assistance Bed Mobility: Rolling;Sidelying to Sit;Supine to Sit;Sit to Sidelying Rolling: Min guard Sidelying to sit: Min guard Supine to sit: Min guard   Sit to sidelying: Min guard General bed mobility comments: reviewed log roll.   Transfers Overall transfer level: Needs assistance Equipment used: Rolling walker (2 wheeled) Transfers: Lateral/Scoot Transfers;Sit to/from Stand Sit to Stand: Min guard;From elevated surface        Lateral/Scoot Transfers: Supervision    Ambulation/Gait Ambulation/Gait assistance: Min guard Gait Distance (Feet): 160 Feet Assistive device: Rolling walker (2 wheeled) Gait Pattern/deviations: WFL(Within Functional  Limits)     General Gait Details: on 3L/min O2; used portable oximeter for better accuracy    Stairs             Wheelchair Mobility    Modified Rankin (Stroke Patients Only)       Balance Overall balance assessment: Modified Independent;No apparent balance deficits (not formally assessed) Sitting-balance support: Single extremity supported;No upper extremity supported;Feet unsupported Sitting balance-Leahy Scale: Fair                                      Cognition Arousal/Alertness: Awake/alert Behavior During Therapy: WFL for tasks assessed/performed Overall Cognitive Status: Within Functional Limits for tasks assessed                                 General Comments: mildly impulsive, requires cues      Exercises General Exercises - Lower Extremity Long Arc Quad: AROM;15 reps;Seated Other Exercises Other Exercises: pt educated in energy conservation strategies including home/routines modifications, work simplification, positioning, activity pacing, pursed lip breathing, AE/DME, and falls prevention strategies to support independence with ADL and IADL While minimizing risk of SOB/COPD exacerbation and falls. Other Exercises: pt instructed in log roll technique for bed mobility to improve independence while minimizing  back pain    General Comments General comments (skin integrity, edema, etc.): HR generally in 60's-70's, up to 142 briefly w/ VTach while sitting EOB brushing her teeth. RN notified.      Pertinent Vitals/Pain Pain Assessment: Faces Pain  Score: 10-Worst pain ever Faces Pain Scale: Hurts little more Pain Location: mid back pain Pain Descriptors / Indicators: Radiating;Aching;Sharp Pain Intervention(s): Limited activity within patient's tolerance;Premedicated before session;Monitored during session    Home Living Family/patient expects to be discharged to:: Private residence Living Arrangements: Spouse/significant  other;Children Available Help at Discharge: Family Type of Home: House Home Access: Stairs to enter Entrance Stairs-Rails: None Home Layout: One level Home Equipment: Environmental consultant - 2 wheels;Cane - single point;Shower seat;Grab bars - tub/shower;Wheelchair - manual Additional Comments: pt reports family is going to get a toilet riser    Prior Function Level of Independence: Independent      Comments: Goes to gym to walk on treadmill; endorses "furniture cruising"   PT Goals (current goals can now be found in the care plan section) Acute Rehab PT Goals Patient Stated Goal: To return to working out at the gym PT Goal Formulation: With patient Time For Goal Achievement: 10/15/18 Progress towards PT goals: Progressing toward goals    Frequency    Min 2X/week      PT Plan Discharge plan needs to be updated    Co-evaluation              AM-PAC PT "6 Clicks" Mobility   Outcome Measure  Help needed turning from your back to your side while in a flat bed without using bedrails?: None Help needed moving from lying on your back to sitting on the side of a flat bed without using bedrails?: A Little Help needed moving to and from a bed to a chair (including a wheelchair)?: A Little Help needed standing up from a chair using your arms (e.g., wheelchair or bedside chair)?: A Little Help needed to walk in hospital room?: A Little Help needed climbing 3-5 steps with a railing? : A Little 6 Click Score: 19    End of Session Equipment Utilized During Treatment: Oxygen;Gait belt Activity Tolerance: Patient tolerated treatment well Patient left: in bed;with call bell/phone within reach Nurse Communication: Mobility status PT Visit Diagnosis: History of falling (Z91.81);Difficulty in walking, not elsewhere classified (R26.2)     Time: 1436-1500 PT Time Calculation (min) (ACUTE ONLY): 24 min  Charges:  $Therapeutic Exercise: 8-22 mins $Therapeutic Activity: 8-22 mins                  6:30 PM, 10/02/18 Rosamaria Lints, PT, DPT Physical Therapist - [facility]  [phone] (ASCOM)     Buccola,Allan C 10/02/2018, 6:26 PM

## 2018-10-02 NOTE — Progress Notes (Signed)
[employer] - [place] at [facility]   PATIENT NAME: Caitlyn Evans    MR#:  [id number]  DATE OF BIRTH:  1966-03-10  SUBJECTIVE:  CHIEF COMPLAINT:   Chief Complaint  Patient presents with  . Shortness of Breath   -Off BiPAP since yesterday.  Breathing is about the same.  Complains of cough -Still has significant upper back pain from her fall and vertebral compression fracture.  REVIEW OF SYSTEMS:  Review of Systems  Constitutional: Negative for chills, fever and malaise/fatigue.  HENT: Negative for congestion, ear discharge, hearing loss and nosebleeds.   Eyes: Negative for blurred vision and double vision.  Respiratory: Positive for cough, shortness of breath and wheezing.   Cardiovascular: Negative for chest pain and palpitations.  Gastrointestinal: Negative for abdominal pain, constipation, diarrhea, nausea and vomiting.  Genitourinary: Negative for dysuria.  Musculoskeletal: Positive for back pain.  Neurological: Negative for dizziness, focal weakness, seizures, weakness and headaches.  Psychiatric/Behavioral: Negative for depression.    DRUG ALLERGIES:   Allergies  Allergen Reactions  . Codeine Hives, Nausea And Vomiting and Nausea Only  . Penicillins Hives and Other (See Comments)    Has patient had a PCN reaction causing immediate rash, facial/tongue/throat swelling, SOB or lightheadedness with hypotension: No Has patient had a PCN reaction causing severe rash involving mucus membranes or skin necrosis: No Has patient had a PCN reaction that required hospitalization No Has patient had a PCN reaction occurring within the last 10 years: No If all of the above answers are "NO", then may proceed with Cephalosporin use. Other reaction(s): Other (See Comments) Has patient had a PCN    VITALS:  Blood pressure (!) 93/59, pulse 70, temperature 98.8 F (37.1 C), temperature source Oral, resp. rate 15, height 4\' 10"  (1.473 m), weight 52.2 kg, SpO2 (!) 88  %.  PHYSICAL EXAMINATION:  Physical Exam  GENERAL:  53 y.o.-year-old patient lying in the bed, low back pain secondary to fracture. EYES: Pupils equal, round, reactive to light and accommodation. No scleral icterus. Extraocular muscles intact.  HEENT: Head atraumatic, normocephalic. Oropharynx and nasopharynx clear.  NECK:  Supple, no jugular venous distention. No thyroid enlargement, no tenderness.  LUNGS: Moving air bilaterally, has occasional scattered wheezing, no rales,rhonchi or crepitation. No use of accessory muscles of respiration.  CARDIOVASCULAR: S1, S2 normal. No murmurs, rubs, or gallops.  ABDOMEN: Soft, nontender, nondistended. Bowel sounds present. No organomegaly or mass.  EXTREMITIES: No pedal edema, cyanosis, or clubbing.  NEUROLOGIC: Cranial nerves II through XII are intact. Muscle strength 5/5 in all extremities. Sensation intact. Gait not checked.  PSYCHIATRIC: The patient is alert and oriented x 3.  SKIN: No obvious rash, lesion, or ulcer.    LABORATORY PANEL:   CBC Recent Labs  Lab 10/02/18 0453  WBC 26.3*  HGB 9.3*  HCT 31.1*  PLT 316   ------------------------------------------------------------------------------------------------------------------  Chemistries  Recent Labs  Lab 10/02/18 0453  NA 140  K 4.2  CL 100  CO2 37*  GLUCOSE 153*  BUN 23*  CREATININE 0.45  CALCIUM 8.4*   ------------------------------------------------------------------------------------------------------------------  Cardiac Enzymes Recent Labs  Lab 09/30/18 1525  TROPONINI <0.03   ------------------------------------------------------------------------------------------------------------------  RADIOLOGY:  Dg Chest 2 View  Result Date: 09/30/2018 CLINICAL DATA:  Acute onset of LEFT-sided chest pain and shortness of breath that began today. Patient recently discharged from the hospital after treatment for multifocal pneumonia. Prior LEFT UPPER LOBE partial  resection. EXAM: CHEST - 2 VIEW COMPARISON:  CT chest 09/23/2018 and earlier. Chest  x-rays 09/22/2018 and earlier. FINDINGS: AP ERECT and LATERAL images were obtained. Interval resolution of the patchy ground-glass airspace opacities in both lungs since the CT 1 week ago. Biapical pleuroparenchymal scarring and LEFT basilar pleuroparenchymal scarring, unchanged. Mild central peribronchial thickening, unchanged. Lungs otherwise clear. No confluent airspace consolidation currently. No pleural effusions. Normal pulmonary vascularity. Surgical suture material in the LEFT UPPER LOBE. Prior augmentation of an UPPER thoracic vertebral body. IMPRESSION: Interval resolution of the patchy ground-glass airspace opacities in both lungs since the CT 1 week ago. Stable mild changes of chronic bronchitis and/or asthma. No acute cardiopulmonary disease currently. Electronically Signed   By: Hulan Saashomas  Lawrence M.D.   On: 09/30/2018 15:12   Ct Angio Chest Pe W And/or Wo Contrast  Result Date: 09/30/2018 CLINICAL DATA:  53 year old female with shortness of breath and chest pain, labored breathing and wheezing. Recently discharged with pneumonia. Suspect pulmonary embolism, high pretest probability. EXAM: CT ANGIOGRAPHY CHEST WITH CONTRAST TECHNIQUE: Multidetector CT imaging of the chest was performed using the standard protocol during bolus administration of intravenous contrast. Multiplanar CT image reconstructions and MIPs were obtained to evaluate the vascular anatomy. CONTRAST:  75mL OMNIPAQUE IOHEXOL 350 MG/ML SOLN COMPARISON:  Recent CT scan of the chest 09/23/2018 FINDINGS: Cardiovascular: Adequate opacification of the pulmonary arteries to the proximal segmental level. No evidence of central filling defect to suggest acute pulmonary embolus. The main pulmonary artery is normal in size. Normal caliber thoracic aorta with conventional 3 vessel arch anatomy. No evidence of dissection. The heart is normal in size. No pericardial  effusion. Mediastinum/Nodes: Unremarkable CT appearance of the thyroid gland. No suspicious mediastinal or hilar adenopathy. No soft tissue mediastinal mass. The thoracic esophagus is unremarkable. Lungs/Pleura: Biapical pleuroparenchymal scarring again noted. Multifocal areas of prior consolidative airspace opacity are resolving leaving multiple small cystic spaces behind. The findings are most consistent with resolving pneumonia. No new focal airspace opacity is identified. No evidence of pulmonary edema. No pleural effusion or pneumothorax. Upper Abdomen: No acute abnormality. Musculoskeletal: New/progressive T5 vertebral fracture with approximately 60% height loss. Evidence of prior T6 compression fracture status post cement augmentation. Chronic T12 superior endplate compression fracture without significant interval change. Less than 20% height loss. 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 Review of the MIP images confirms the above findings. IMPRESSION: 1. Negative for acute pulmonary embolus. 2. Significant interval improvement in multifocal airspace opacities compared to 09/23/2018. CT findings are most consistent with largely resolved multifocal pneumonia with only trace residual scarring and small pneumatoceles. 3. New/progressed T5 compression fracture now with approximately 60% height loss. 4. Stable remote T6 and T12 compression fractures with evidence of prior cement augmentation at T6. 5. 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 Electronically Signed   By: Malachy MoanHeath  McCullough M.D.   On: 09/30/2018 17:47   Mr Thoracic Spine Wo Contrast  Result Date: 10/01/2018 CLINICAL DATA:  53 y/o F; fall 2-3 days ago with mid to upper back pain. EXAM: MRI THORACIC SPINE WITHOUT CONTRAST TECHNIQUE: Multiplanar, multisequence MR imaging of the thoracic spine was performed. No intravenous contrast was administered. COMPARISON:  09/30/2018 and 09/23/2018 CT  chest. 09/14/2018 MRI of the thoracic spine. FINDINGS: Alignment:  Physiologic. Vertebrae: T5 compression deformity with 60% loss of vertebral body height and edema indicating recent injury. There is 3 mm of retropulsion of the inferior endplate. Chronic moderate T6 compression deformity post augmentation containing low signal kyphoplasty material. Stable chronic mild superior endplate T12 compression deformity with  2 mm retropulsion of the superior endplate effacing the ventral thecal sac without significant spinal canal stenosis. Cord:  No abnormal cord signal. Paraspinal and other soft tissues: Mild paraspinal edema is present from T4-T8, likely related to acute compression deformity of the T5 vertebral body. No fluid collection. Disc levels: T1-T4: No significant disc displacement, foraminal stenosis, or canal stenosis. T5-6: Retropulsion of the T5 inferior endplate combined with epidural lipomatosis results in moderate narrowing of the thecal sac and mild anterior cord impingement. No significant foraminal stenosis. T6-T11: No significant disc displacement, foraminal stenosis, or canal stenosis. T12-L1: Retropulsion of the T12 superior endplate results in ventral thecal sac effacement and without neural impingement. No significant foraminal or canal stenosis. IMPRESSION: 1. Acute T5 compression deformity with 60% loss of height and 3 mm retropulsion of inferior endplate. 2. 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 No abnormal cord signal. 3. Stable chronic T6 and T12 compression deformities. Electronically Signed   By: Mitzi Hansen M.D.   On: 10/01/2018 22:49    EKG:   Orders placed or performed during the hospital encounter of 09/30/18  . ED EKG  . ED EKG  . EKG 12-Lead  . EKG 12-Lead    ASSESSMENT AND PLAN:   53 year old female with past medical history significant  for COPD on 3 L home oxygen, ongoing smoking, Boop, CHF, GERD and hypertension presents to hospital secondary to a fall and shortness of breath.  1.  Acute hypoxic hypercarbic respiratory failure-secondary to COPD exacerbation-improved after BiPAP -Patient continues to smoke.  Counseled - Now weaned off Bipap and on 3 L oxygen which is her chronic home oxygen -Continue IV steroids, nebs and inhalers at this time.  2.  Fall and T5 compression fracture-appreciate orthopedics consult - ortho or IR Not available for kyphoplasty at this time -MRI of the thoracic spine reviewed and patient has acute T5 compression fracture with 60% height loss. -Recent T6 kyphoplasty done by Ortho. -As pain is limiting movement and patient is in severe distress, reached out to IR.  Will work on transferring patient to [facility] for IR guided kyphoplasty for Monday.    3.  CHF-has diastolic dysfunction.  EF of 55 to 65% on recent echo -Well compensated.  Continue oral Lasix at this time.  4.  GERD-on Protonix  5.  DVT prophylaxis-Lovenox  6.  Hypertension history of sinus tachycardia-continue Lopressor and Cardizem  7.  Leukocytosis-likely stress reaction and also from steroids.  Continue to monitor.  No source of infection identified   All the records are reviewed and case discussed with Care Management/Social Workerr. Management plans discussed with the patient, family and they are in agreement.  CODE STATUS: Full code  TOTAL TIME TAKING CARE OF THIS PATIENT: 38 minutes.   POSSIBLE D/C IN 1-2 DAYS, DEPENDING ON CLINICAL CONDITION.   Enid Baas M.D on 10/02/2018 at 10:50 AM  Between 7am to 6pm - Pager - [phone]  After 6pm go to [email or web address] - Social research officer, government  [facility]  Office  [phone]  CC: Primary care physician; Corky Downs, MD

## 2018-10-03 ENCOUNTER — Inpatient Hospital Stay: Payer: Medicare Other

## 2018-10-03 LAB — URINALYSIS, COMPLETE (UACMP) WITH MICROSCOPIC
BILIRUBIN URINE: NEGATIVE
Bacteria, UA: NONE SEEN
Glucose, UA: >=500 mg/dL — AB
Hgb urine dipstick: NEGATIVE
Ketones, ur: NEGATIVE mg/dL
Leukocytes, UA: NEGATIVE
NITRITE: NEGATIVE
Protein, ur: NEGATIVE mg/dL
Specific Gravity, Urine: 1.01 (ref 1.005–1.030)
pH: 7 (ref 5.0–8.0)

## 2018-10-03 LAB — BASIC METABOLIC PANEL
Anion gap: 2 — ABNORMAL LOW (ref 5–15)
BUN: 26 mg/dL — ABNORMAL HIGH (ref 6–20)
CHLORIDE: 102 mmol/L (ref 98–111)
CO2: 35 mmol/L — ABNORMAL HIGH (ref 22–32)
Calcium: 8 mg/dL — ABNORMAL LOW (ref 8.9–10.3)
Creatinine, Ser: 0.48 mg/dL (ref 0.44–1.00)
GFR calc Af Amer: >60 mL/min (ref >60)
GFR calc non Af Amer: >60 mL/min (ref >60)
Glucose, Bld: 215 mg/dL — ABNORMAL HIGH (ref 70–99)
Potassium: 5.2 mmol/L — ABNORMAL HIGH (ref 3.5–5.1)
Sodium: 139 mmol/L (ref 135–145)

## 2018-10-03 LAB — CBC
HEMATOCRIT: 34.1 % — AB (ref 36.0–46.0)
Hemoglobin: 10 g/dL — ABNORMAL LOW (ref 12.0–15.0)
MCH: 26.2 pg (ref 26.0–34.0)
MCHC: 29.3 g/dL — ABNORMAL LOW (ref 30.0–36.0)
MCV: 89.3 fL (ref 80.0–100.0)
Platelets: 336 10*3/uL (ref 150–400)
RBC: 3.82 MIL/uL — ABNORMAL LOW (ref 3.87–5.11)
RDW: 18.1 % — ABNORMAL HIGH (ref 11.5–15.5)
WBC: 30.1 10*3/uL — ABNORMAL HIGH (ref 4.0–10.5)
nRBC: 0 % (ref 0.0–0.2)

## 2018-10-03 MED ORDER — LIDOCAINE 5 % EX PTCH
1.0000 | MEDICATED_PATCH | CUTANEOUS | Status: DC
  Administered 2018-10-03 – 2018-10-04 (×2): 1 via TRANSDERMAL
  Filled 2018-10-03 (×3): qty 1

## 2018-10-03 MED ORDER — PATIROMER SORBITEX CALCIUM 8.4 G PO PACK
8.4000 g | PACK | Freq: Every day | ORAL | Status: DC
  Administered 2018-10-03: 8.4 g via ORAL
  Filled 2018-10-03 (×4): qty 1

## 2018-10-03 NOTE — Progress Notes (Signed)
This nurse spoke with patient this morning about the importance of calling when she wants to get out of bed.  When receiving report this morning nurse was told that patient was non-compliant and getting up to bathroom alone. Patient is refusing bed alarm.

## 2018-10-03 NOTE — Consult Note (Signed)
ORTHOPAEDICS PROGRESS NOTE  PATIENT NAME: Caitlyn Evans DOB: 1966-08-28  MRN: [id number]   Subjective: Patient states that her breathing is better but she still has significant upper back pain.  Objective: Vital signs in last 24 hours: Temp:  [98.1 F (36.7 C)-98.2 F (36.8 C)] 98.1 F (36.7 C) (01/17 2254) Pulse Rate:  [49-80] 80 (01/18 0820) Resp:  [15-24] 16 (01/18 0820) BP: (111-146)/(65-90) 146/87 (01/18 0820) SpO2:  [89 %-99 %] 98 % (01/18 1131) FiO2 (%):  [35 %] 35 % (01/18 0326)  Intake/Output from previous day: 01/17 0701 - 01/18 0700 In: 650 [P.O.:600; IV Piggyback:50] Out: -   Recent Labs    09/30/18 1525 10/01/18 0342 10/02/18 0453 10/03/18 0338  WBC 16.1* 8.9 26.3* 30.1*  HGB 12.1 10.1* 9.3* 10.0*  HCT 39.2 33.6* 31.1* 34.1*  PLT 354 325 316 336  K 3.9 4.0 4.2 5.2*  CL 96* 98 100 102  CO2 30 33* 37* 35*  BUN 16 19 23* 26*  CREATININE 0.53 0.70 0.45 0.48  GLUCOSE 133* 177* 153* 215*  CALCIUM 8.5* 8.4* 8.4* 8.0*    EXAM General: Well developed well nourished female in no apparent discomfort. Back: Tenderness to palpation along upper thoracic spine. Guarding with motion of upper back.  Assessment: T5 vertebral compression fracture   Plan: Agree with plans for Interventional Radiology to proceed with kyphoplasty, given Dr. Rosita Kea absence. Lidoderm patches to be applied for symptomatic relief.  Bradey Luzier P. Angie Fava M.D.

## 2018-10-03 NOTE — Care Management Note (Signed)
Case Management Note  Patient Details  Name: Caitlyn Evans MRN: [id number] Date of Birth: [birth year]  Subjective/Objective:  RNCM team consulted don patient for NIV and home health needs. Patient has refused home health in the past but now agreeable. CMS Medicare.gov Compare Post Acute Care list reviewed with patient and she prefers [facility] for the use of the COPD protocol. Signed protocol given to [facility]. Referral for services with [facility]. NIV authorization is pending and could take 5-10 days since [facility] the agency. MD aware. If patient discharged before authorization [facility] will reach out to patient for NIV delivery.                   Action/Plan:   Expected Discharge Date:  10/02/18               Expected Discharge Plan:  Home w Home Health Services  In-House Referral:     Discharge planning Services  CM Consult  Post Acute Care Choice:  Durable Medical Equipment, Home Health Choice offered to:  Patient  DME Arranged:  Nebulizer machine, NIV DME Agency:  [facility]  [facility]  [facility]  Status of Service:  In process, will continue to follow  If discussed at Long Length of Stay Meetings, dates discussed:    Additional Comments:  Virgel ManifoldJosh A Alilah Mcmeans, RN 10/03/2018, 1:22 PM

## 2018-10-03 NOTE — Progress Notes (Signed)
[employer] - [place] at [facility]   PATIENT NAME: Caitlyn Evans    MR#:  [id number]  DATE OF BIRTH:  1966-02-13  SUBJECTIVE:  CHIEF COMPLAINT:   Chief Complaint  Patient presents with  . Shortness of Breath   -Complains of significant upper back pain.  [facility] to see if patient can be transferred for kyphoplasty -Remains on 3 L oxygen which is chronic home O2  REVIEW OF SYSTEMS:  Review of Systems  Constitutional: Negative for chills, fever and malaise/fatigue.  HENT: Negative for congestion, ear discharge, hearing loss and nosebleeds.   Eyes: Negative for blurred vision and double vision.  Respiratory: Positive for cough, shortness of breath and wheezing.   Cardiovascular: Negative for chest pain and palpitations.  Gastrointestinal: Negative for abdominal pain, constipation, diarrhea, nausea and vomiting.  Genitourinary: Negative for dysuria.  Musculoskeletal: Positive for back pain.  Neurological: Negative for dizziness, focal weakness, seizures, weakness and headaches.  Psychiatric/Behavioral: Negative for depression.    DRUG ALLERGIES:   Allergies  Allergen Reactions  . Codeine Hives, Nausea And Vomiting and Nausea Only  . Penicillins Hives and Other (See Comments)    Has patient had a PCN reaction causing immediate rash, facial/tongue/throat swelling, SOB or lightheadedness with hypotension: No Has patient had a PCN reaction causing severe rash involving mucus membranes or skin necrosis: No Has patient had a PCN reaction that required hospitalization No Has patient had a PCN reaction occurring within the last 10 years: No If all of the above answers are "NO", then may proceed with Cephalosporin use. Other reaction(s): Other (See Comments) Has patient had a PCN    VITALS:  Blood pressure (!) 146/87, pulse 80, temperature 98.1 F (36.7 C), temperature source Oral, resp. rate 16, height 4\' 10"  (1.473 m), weight 52.2  kg, SpO2 98 %.  PHYSICAL EXAMINATION:  Physical Exam  GENERAL:  53 y.o.-year-old patient lying in the bed, low back pain secondary to fracture. EYES: Pupils equal, round, reactive to light and accommodation. No scleral icterus. Extraocular muscles intact.  HEENT: Head atraumatic, normocephalic. Oropharynx and nasopharynx clear.  NECK:  Supple, no jugular venous distention. No thyroid enlargement, no tenderness.  LUNGS: Moving air bilaterally, has occasional scattered wheezing, no rales,rhonchi or crepitation. No use of accessory muscles of respiration.  CARDIOVASCULAR: S1, S2 normal. No murmurs, rubs, or gallops.  ABDOMEN: Soft, nontender, nondistended. Bowel sounds present. No organomegaly or mass.  EXTREMITIES: No pedal edema, cyanosis, or clubbing.  NEUROLOGIC: Cranial nerves II through XII are intact. Muscle strength 5/5 in all extremities. Sensation intact. Gait not checked.  PSYCHIATRIC: The patient is alert and oriented x 3.  SKIN: No obvious rash, lesion, or ulcer.    LABORATORY PANEL:   CBC Recent Labs  Lab 10/03/18 0338  WBC 30.1*  HGB 10.0*  HCT 34.1*  PLT 336   ------------------------------------------------------------------------------------------------------------------  Chemistries  Recent Labs  Lab 10/03/18 0338  NA 139  K 5.2*  CL 102  CO2 35*  GLUCOSE 215*  BUN 26*  CREATININE 0.48  CALCIUM 8.0*   ------------------------------------------------------------------------------------------------------------------  Cardiac Enzymes Recent Labs  Lab 09/30/18 1525  TROPONINI <0.03   ------------------------------------------------------------------------------------------------------------------  RADIOLOGY:  Mr Thoracic Spine Wo Contrast  Result Date: 10/01/2018 CLINICAL DATA:  53 y/o F; fall 2-3 days ago with mid to upper back pain. EXAM: MRI THORACIC SPINE WITHOUT CONTRAST TECHNIQUE: Multiplanar, multisequence MR imaging of the thoracic spine  was performed. No intravenous contrast was administered. COMPARISON:  09/30/2018 and 09/23/2018 CT chest. 09/14/2018 MRI of the thoracic spine. FINDINGS: Alignment:  Physiologic. Vertebrae: T5 compression deformity with 60% loss of vertebral body height and edema indicating recent injury. There is 3 mm of retropulsion of the inferior endplate. Chronic moderate T6 compression deformity post augmentation containing low signal kyphoplasty material. Stable chronic mild superior endplate T12 compression deformity with 2 mm retropulsion of the superior endplate effacing the ventral thecal sac without significant spinal canal stenosis. Cord:  No abnormal cord signal. Paraspinal and other soft tissues: Mild paraspinal edema is present from T4-T8, likely related to acute compression deformity of the T5 vertebral body. No fluid collection. Disc levels: T1-T4: No significant disc displacement, foraminal stenosis, or canal stenosis. T5-6: Retropulsion of the T5 inferior endplate combined with epidural lipomatosis results in moderate narrowing of the thecal sac and mild anterior cord impingement. No significant foraminal stenosis. T6-T11: No significant disc displacement, foraminal stenosis, or canal stenosis. T12-L1: Retropulsion of the T12 superior endplate results in ventral thecal sac effacement and without neural impingement. No significant foraminal or canal stenosis. IMPRESSION: 1. Acute T5 compression deformity with 60% loss of height and 3 mm retropulsion of inferior endplate. 2. 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 No abnormal cord signal. 3. Stable chronic T6 and T12 compression deformities. Electronically Signed   By: Mitzi Hansen M.D.   On: 10/01/2018 22:49    EKG:   Orders placed or performed during the hospital encounter of 09/30/18  . ED EKG  . ED EKG  . EKG 12-Lead  .  EKG 12-Lead    ASSESSMENT AND PLAN:   53 year old female with past medical history significant for COPD on 3 L home oxygen, ongoing smoking, Boop, CHF, GERD and hypertension presents to hospital secondary to a fall and shortness of breath.  1.  Acute hypoxic hypercarbic respiratory failure-secondary to COPD exacerbation-improved after BiPAP -Patient continues to smoke.  Counseled - Now weaned off Bipap and on 3 L oxygen which is her chronic home oxygen -Continue IV steroids, nebs and inhalers at this time.  2.  Fall and T5 compression fracture-appreciate orthopedics consult - ortho or IR Not available for kyphoplasty at this time -MRI of the thoracic spine reviewed and patient has acute T5 compression fracture with 60% height loss. -Recent T6 kyphoplasty done by Ortho. -As pain is limiting movement and patient is in severe distress, reached out to IR.  Will work on transferring patient to [facility] for IR guided kyphoplasty for Monday.    3.  CHF-has diastolic dysfunction.  EF of 55 to 65% on recent echo -Well compensated.  Continue oral Lasix at this time.  4.  GERD-on Protonix  5.  DVT prophylaxis-Lovenox  6.  Hypertension history of sinus tachycardia-continue Lopressor and Cardizem  7.  Leukocytosis-likely stress reaction and also from steroids.  Continue to monitor.  No source of infection identified -check UA   All the records are reviewed and case discussed with Care Management/Social Workerr. Management plans discussed with the patient, family and they are in agreement.  CODE STATUS: Full code  TOTAL TIME TAKING CARE OF THIS PATIENT: 38 minutes.   POSSIBLE D/C IN 1-2 DAYS, DEPENDING ON CLINICAL CONDITION.   Enid Baas M.D on 10/03/2018 at 11:11 AM  Between 7am to 6pm - Pager - [phone]  After 6pm go to [email or web address] - password [email or web address]  [employer]  [phone]  CC: Primary care physician; Cletis Athens, MD

## 2018-10-04 LAB — CBC WITH DIFFERENTIAL/PLATELET
Abs Immature Granulocytes: 0.82 10*3/uL — ABNORMAL HIGH (ref 0.00–0.07)
BASOS ABS: 0 10*3/uL (ref 0.0–0.1)
Basophils Relative: 0 %
Eosinophils Absolute: 0 10*3/uL (ref 0.0–0.5)
Eosinophils Relative: 0 %
HCT: 35.4 % — ABNORMAL LOW (ref 36.0–46.0)
Hemoglobin: 10.6 g/dL — ABNORMAL LOW (ref 12.0–15.0)
Immature Granulocytes: 3 %
Lymphocytes Relative: 1 %
Lymphs Abs: 0.3 10*3/uL — ABNORMAL LOW (ref 0.7–4.0)
MCH: 26.4 pg (ref 26.0–34.0)
MCHC: 29.9 g/dL — AB (ref 30.0–36.0)
MCV: 88.3 fL (ref 80.0–100.0)
Monocytes Absolute: 0.8 10*3/uL (ref 0.1–1.0)
Monocytes Relative: 3 %
NEUTROS ABS: 24 10*3/uL — AB (ref 1.7–7.7)
Neutrophils Relative %: 93 %
Platelets: 349 10*3/uL (ref 150–400)
RBC: 4.01 MIL/uL (ref 3.87–5.11)
RDW: 18.5 % — ABNORMAL HIGH (ref 11.5–15.5)
Smear Review: NORMAL
WBC: 26 10*3/uL — ABNORMAL HIGH (ref 4.0–10.5)
nRBC: 0.1 % (ref 0.0–0.2)

## 2018-10-04 LAB — BASIC METABOLIC PANEL
Anion gap: 4 — ABNORMAL LOW (ref 5–15)
BUN: 22 mg/dL — ABNORMAL HIGH (ref 6–20)
CHLORIDE: 100 mmol/L (ref 98–111)
CO2: 37 mmol/L — AB (ref 22–32)
Calcium: 8.6 mg/dL — ABNORMAL LOW (ref 8.9–10.3)
Creatinine, Ser: 0.62 mg/dL (ref 0.44–1.00)
GFR calc Af Amer: >60 mL/min (ref >60)
GFR calc non Af Amer: >60 mL/min (ref >60)
Glucose, Bld: 173 mg/dL — ABNORMAL HIGH (ref 70–99)
POTASSIUM: 4.9 mmol/L (ref 3.5–5.1)
Sodium: 141 mmol/L (ref 135–145)

## 2018-10-04 MED ORDER — BISACODYL 5 MG PO TBEC
10.0000 mg | DELAYED_RELEASE_TABLET | Freq: Every day | ORAL | Status: DC
  Administered 2018-10-04 – 2018-10-05 (×2): 10 mg via ORAL
  Filled 2018-10-04 (×2): qty 2

## 2018-10-04 MED ORDER — POLYETHYLENE GLYCOL 3350 17 G PO PACK: 17.0000 g | PACK | Freq: Every day | ORAL | Status: DC

## 2018-10-04 MED ORDER — LACTULOSE 10 GM/15ML PO SOLN: 20.0000 g | Freq: Two times a day (BID) | ORAL | Status: DC | PRN

## 2018-10-04 MED ORDER — OXYCODONE-ACETAMINOPHEN 5-325 MG PO TABS
1.0000 | ORAL_TABLET | Freq: Four times a day (QID) | ORAL | Status: DC | PRN
  Administered 2018-10-04: 2 via ORAL
  Administered 2018-10-05: 1 via ORAL
  Administered 2018-10-05: 2 via ORAL
  Filled 2018-10-04: qty 2
  Filled 2018-10-04: qty 1
  Filled 2018-10-04: qty 2

## 2018-10-04 NOTE — Progress Notes (Signed)
[facility]   PATIENT NAME: Caitlyn Evans    MR#:  [id number]  DATE OF BIRTH:  08-25-1966  SUBJECTIVE:  CHIEF COMPLAINT:   Chief Complaint  Patient presents with  . Shortness of Breath   -Still complains of significant upper back pain. -No plans for kyphoplasty tomorrow.  REVIEW OF SYSTEMS:  Review of Systems  Constitutional: Negative for chills, fever and malaise/fatigue.  HENT: Negative for congestion, ear discharge, hearing loss and nosebleeds.   Eyes: Negative for blurred vision and double vision.  Respiratory: Positive for cough, shortness of breath and wheezing.   Cardiovascular: Negative for chest pain and palpitations.  Gastrointestinal: Negative for abdominal pain, constipation, diarrhea, nausea and vomiting.  Genitourinary: Negative for dysuria.  Musculoskeletal: Positive for back pain.  Neurological: Negative for dizziness, focal weakness, seizures, weakness and headaches.  Psychiatric/Behavioral: Negative for depression.    DRUG ALLERGIES:   Allergies  Allergen Reactions  . Codeine Hives, Nausea And Vomiting and Nausea Only  . Penicillins Hives and Other (See Comments)    Has patient had a PCN reaction causing immediate rash, facial/tongue/throat swelling, SOB or lightheadedness with hypotension: No Has patient had a PCN reaction causing severe rash involving mucus membranes or skin necrosis: No Has patient had a PCN reaction that required hospitalization No Has patient had a PCN reaction occurring within the last 10 years: No If all of the above answers are "NO", then may proceed with Cephalosporin use. Other reaction(s): Other (See Comments) Has patient had a PCN    VITALS:  Blood pressure 129/71, pulse 69, temperature (!) 97.5 F (36.4 C), temperature source Oral, resp. rate (!) 26, height 4\' 10"  (1.473 m), weight 52.2 kg, SpO2 98 %.  PHYSICAL EXAMINATION:  Physical Exam  GENERAL:  53 y.o.-year-old patient  lying in the bed, low back pain secondary to fracture. EYES: Pupils equal, round, reactive to light and accommodation. No scleral icterus. Extraocular muscles intact.  HEENT: Head atraumatic, normocephalic. Oropharynx and nasopharynx clear.  NECK:  Supple, no jugular venous distention. No thyroid enlargement, no tenderness.  LUNGS: Moving air bilaterally, has occasional scattered wheezing, no rales,rhonchi or crepitation. No use of accessory muscles of respiration.  CARDIOVASCULAR: S1, S2 normal. No murmurs, rubs, or gallops.  ABDOMEN: Soft, nontender, nondistended. Bowel sounds present. No organomegaly or mass.  EXTREMITIES: No pedal edema, cyanosis, or clubbing.  NEUROLOGIC: Cranial nerves II through XII are intact. Muscle strength 5/5 in all extremities. Sensation intact. Gait not checked.  PSYCHIATRIC: The patient is alert and oriented x 3.  SKIN: No obvious rash, lesion, or ulcer.    LABORATORY PANEL:   CBC Recent Labs  Lab 10/04/18 0331  WBC 26.0*  HGB 10.6*  HCT 35.4*  PLT 349   ------------------------------------------------------------------------------------------------------------------  Chemistries  Recent Labs  Lab 10/04/18 0331  NA 141  K 4.9  CL 100  CO2 37*  GLUCOSE 173*  BUN 22*  CREATININE 0.62  CALCIUM 8.6*   ------------------------------------------------------------------------------------------------------------------  Cardiac Enzymes Recent Labs  Lab 09/30/18 1525  TROPONINI <0.03   ------------------------------------------------------------------------------------------------------------------  RADIOLOGY:  Dg Chest 2 View  Result Date: 10/03/2018 CLINICAL DATA:  Dyspnea. Patient was admitted on 09/30/2018 for acute respiratory failure with hypoxia. Patient recently had T6 kyphoplasty 09/17/2018. Fall about 1 week ago resulting in new T5 compression fx. Hx of sternal fx, respiratory failure with hypercapnia-08/2018, MI-2012, lung mass,  HTN, emphysema, CHF, BOOP, asthma, current smoker, left upper lobe of lung removed. EXAM: CHEST - 2 VIEW  COMPARISON:  09/30/2018 FINDINGS: Cardiac silhouette is normal in size. No mediastinal or hilar masses. No evidence of adenopathy. Lungs are hyperexpanded with stable scarring at the apices. No convincing pneumonia or pulmonary edema. No pleural effusion or pneumothorax. Stable changes from midthoracic spine vertebroplasty. No acute skeletal abnormality. IMPRESSION: 1. No acute cardiopulmonary disease. 2. COPD. Electronically Signed   By: Amie Portlandavid  Ormond M.D.   On: 10/03/2018 12:35    EKG:   Orders placed or performed during the hospital encounter of 09/30/18  . ED EKG  . ED EKG  . EKG 12-Lead  . EKG 12-Lead    ASSESSMENT AND PLAN:   53 year old female with past medical history significant for COPD on 3 L home oxygen, ongoing smoking, Boop, CHF, GERD and hypertension presents to hospital secondary to a fall and shortness of breath.  1.  Acute hypoxic hypercarbic respiratory failure-secondary to COPD exacerbation-improved after BiPAP -Patient continues to smoke.  Counseled - Now weaned off Bipap and on 3 L oxygen which is her chronic home oxygen -Continue IV steroids, nebs and inhalers at this time.  2.  Fall and T5 compression fracture-appreciate orthopedics consult - ortho or IR Not available for kyphoplasty at this time -MRI of the thoracic spine reviewed and patient has acute T5 compression fracture with 60% height loss. -Recent T6 kyphoplasty done by Ortho. -As pain is limiting movement and patient is in severe distress, reached out to IR.   -[facility] guided [facility], will check with interventional radiology tomorrow about the same. -Continue pain medications for now.    3.  CHF-has diastolic dysfunction.  EF of 55 to 65% on recent echo -Well compensated.  Continue oral Lasix at this time.  4.  GERD-on Protonix  5.  DVT  prophylaxis-Lovenox  6.  Hypertension history of sinus tachycardia-continue Lopressor and Cardizem  7.  Leukocytosis-likely stress reaction and also from steroids.  Continue to monitor.  No source of infection identified -Urine analysis without any infection.   All the records are reviewed and case discussed with Care Management/Social Workerr. Management plans discussed with the patient, family and they are in agreement.  CODE STATUS: Full code  TOTAL TIME TAKING CARE OF THIS PATIENT: 38 minutes.   POSSIBLE D/C IN 1-2 DAYS, DEPENDING ON CLINICAL CONDITION.   Enid Baasadhika Briauna Gilmartin M.D on 10/04/2018 at 11:31 AM  Between 7am to 6pm - Pager - [phone]  After 6pm go to [email or web address] - Social research officer, governmentpassword EPAS [facility]  [facility]  Office  [phone]  CC: Primary care physician; Corky DownsMasoud, Javed, MD

## 2018-10-05 DIAGNOSIS — R498 Other voice and resonance disorders: Secondary | ICD-10-CM | POA: Diagnosis not present

## 2018-10-05 DIAGNOSIS — J449 Chronic obstructive pulmonary disease, unspecified: Secondary | ICD-10-CM | POA: Diagnosis not present

## 2018-10-05 DIAGNOSIS — K922 Gastrointestinal hemorrhage, unspecified: Secondary | ICD-10-CM | POA: Diagnosis not present

## 2018-10-05 DIAGNOSIS — S22059A Unspecified fracture of T5-T6 vertebra, initial encounter for closed fracture: Secondary | ICD-10-CM | POA: Diagnosis not present

## 2018-10-05 DIAGNOSIS — Z8711 Personal history of peptic ulcer disease: Secondary | ICD-10-CM | POA: Diagnosis not present

## 2018-10-05 DIAGNOSIS — J441 Chronic obstructive pulmonary disease with (acute) exacerbation: Secondary | ICD-10-CM | POA: Diagnosis not present

## 2018-10-05 DIAGNOSIS — E1165 Type 2 diabetes mellitus with hyperglycemia: Secondary | ICD-10-CM | POA: Diagnosis not present

## 2018-10-05 DIAGNOSIS — M5104 Intervertebral disc disorders with myelopathy, thoracic region: Secondary | ICD-10-CM | POA: Diagnosis not present

## 2018-10-05 DIAGNOSIS — T182XXA Foreign body in stomach, initial encounter: Secondary | ICD-10-CM | POA: Diagnosis not present

## 2018-10-05 DIAGNOSIS — N3281 Overactive bladder: Secondary | ICD-10-CM | POA: Diagnosis not present

## 2018-10-05 DIAGNOSIS — R062 Wheezing: Secondary | ICD-10-CM | POA: Diagnosis not present

## 2018-10-05 DIAGNOSIS — G300 Alzheimer's disease with early onset: Secondary | ICD-10-CM | POA: Diagnosis not present

## 2018-10-05 DIAGNOSIS — E569 Vitamin deficiency, unspecified: Secondary | ICD-10-CM | POA: Diagnosis not present

## 2018-10-05 DIAGNOSIS — K2990 Gastroduodenitis, unspecified, without bleeding: Secondary | ICD-10-CM | POA: Diagnosis not present

## 2018-10-05 DIAGNOSIS — R531 Weakness: Secondary | ICD-10-CM | POA: Diagnosis not present

## 2018-10-05 DIAGNOSIS — I10 Essential (primary) hypertension: Secondary | ICD-10-CM | POA: Diagnosis not present

## 2018-10-05 DIAGNOSIS — I517 Cardiomegaly: Secondary | ICD-10-CM | POA: Diagnosis not present

## 2018-10-05 DIAGNOSIS — I110 Hypertensive heart disease with heart failure: Secondary | ICD-10-CM | POA: Diagnosis not present

## 2018-10-05 DIAGNOSIS — G309 Alzheimer's disease, unspecified: Secondary | ICD-10-CM | POA: Diagnosis not present

## 2018-10-05 DIAGNOSIS — J9621 Acute and chronic respiratory failure with hypoxia: Secondary | ICD-10-CM | POA: Diagnosis not present

## 2018-10-05 DIAGNOSIS — R1310 Dysphagia, unspecified: Secondary | ICD-10-CM | POA: Diagnosis not present

## 2018-10-05 DIAGNOSIS — K219 Gastro-esophageal reflux disease without esophagitis: Secondary | ICD-10-CM | POA: Diagnosis not present

## 2018-10-05 DIAGNOSIS — K208 Other esophagitis: Secondary | ICD-10-CM | POA: Diagnosis not present

## 2018-10-05 DIAGNOSIS — G8220 Paraplegia, unspecified: Secondary | ICD-10-CM | POA: Diagnosis not present

## 2018-10-05 DIAGNOSIS — M546 Pain in thoracic spine: Secondary | ICD-10-CM | POA: Diagnosis not present

## 2018-10-05 DIAGNOSIS — Y33XXXA Other specified events, undetermined intent, initial encounter: Secondary | ICD-10-CM | POA: Diagnosis not present

## 2018-10-05 DIAGNOSIS — D72829 Elevated white blood cell count, unspecified: Secondary | ICD-10-CM | POA: Diagnosis not present

## 2018-10-05 DIAGNOSIS — R52 Pain, unspecified: Secondary | ICD-10-CM | POA: Diagnosis not present

## 2018-10-05 DIAGNOSIS — Z428 Encounter for other plastic and reconstructive surgery following medical procedure or healed injury: Secondary | ICD-10-CM | POA: Diagnosis not present

## 2018-10-05 DIAGNOSIS — R918 Other nonspecific abnormal finding of lung field: Secondary | ICD-10-CM | POA: Diagnosis not present

## 2018-10-05 DIAGNOSIS — S22050D Wedge compression fracture of T5-T6 vertebra, subsequent encounter for fracture with routine healing: Secondary | ICD-10-CM | POA: Diagnosis not present

## 2018-10-05 DIAGNOSIS — S8012XA Contusion of left lower leg, initial encounter: Secondary | ICD-10-CM | POA: Diagnosis not present

## 2018-10-05 DIAGNOSIS — M8448XA Pathological fracture, other site, initial encounter for fracture: Secondary | ICD-10-CM | POA: Diagnosis not present

## 2018-10-05 DIAGNOSIS — M40294 Other kyphosis, thoracic region: Secondary | ICD-10-CM | POA: Diagnosis not present

## 2018-10-05 DIAGNOSIS — E785 Hyperlipidemia, unspecified: Secondary | ICD-10-CM | POA: Diagnosis not present

## 2018-10-05 DIAGNOSIS — G8918 Other acute postprocedural pain: Secondary | ICD-10-CM | POA: Diagnosis not present

## 2018-10-05 DIAGNOSIS — M62838 Other muscle spasm: Secondary | ICD-10-CM | POA: Diagnosis not present

## 2018-10-05 DIAGNOSIS — K2980 Duodenitis without bleeding: Secondary | ICD-10-CM | POA: Diagnosis not present

## 2018-10-05 DIAGNOSIS — S3982XA Other specified injuries of lower back, initial encounter: Secondary | ICD-10-CM | POA: Diagnosis not present

## 2018-10-05 DIAGNOSIS — K5909 Other constipation: Secondary | ICD-10-CM | POA: Diagnosis not present

## 2018-10-05 DIAGNOSIS — Z7951 Long term (current) use of inhaled steroids: Secondary | ICD-10-CM | POA: Diagnosis not present

## 2018-10-05 DIAGNOSIS — Z794 Long term (current) use of insulin: Secondary | ICD-10-CM | POA: Diagnosis not present

## 2018-10-05 DIAGNOSIS — M5124 Other intervertebral disc displacement, thoracic region: Secondary | ICD-10-CM | POA: Diagnosis not present

## 2018-10-05 DIAGNOSIS — Z481 Encounter for planned postprocedural wound closure: Secondary | ICD-10-CM | POA: Diagnosis not present

## 2018-10-05 DIAGNOSIS — J9612 Chronic respiratory failure with hypercapnia: Secondary | ICD-10-CM | POA: Diagnosis not present

## 2018-10-05 DIAGNOSIS — T380X5A Adverse effect of glucocorticoids and synthetic analogues, initial encounter: Secondary | ICD-10-CM | POA: Diagnosis not present

## 2018-10-05 DIAGNOSIS — K209 Esophagitis, unspecified: Secondary | ICD-10-CM | POA: Diagnosis not present

## 2018-10-05 DIAGNOSIS — J438 Other emphysema: Secondary | ICD-10-CM | POA: Diagnosis not present

## 2018-10-05 DIAGNOSIS — S2220XA Unspecified fracture of sternum, initial encounter for closed fracture: Secondary | ICD-10-CM | POA: Diagnosis not present

## 2018-10-05 DIAGNOSIS — W19XXXA Unspecified fall, initial encounter: Secondary | ICD-10-CM | POA: Diagnosis not present

## 2018-10-05 DIAGNOSIS — I5030 Unspecified diastolic (congestive) heart failure: Secondary | ICD-10-CM | POA: Diagnosis not present

## 2018-10-05 DIAGNOSIS — Z7952 Long term (current) use of systemic steroids: Secondary | ICD-10-CM | POA: Diagnosis not present

## 2018-10-05 DIAGNOSIS — M8580 Other specified disorders of bone density and structure, unspecified site: Secondary | ICD-10-CM | POA: Diagnosis not present

## 2018-10-05 DIAGNOSIS — E8770 Fluid overload, unspecified: Secondary | ICD-10-CM | POA: Diagnosis not present

## 2018-10-05 DIAGNOSIS — S22000A Wedge compression fracture of unspecified thoracic vertebra, initial encounter for closed fracture: Secondary | ICD-10-CM | POA: Diagnosis not present

## 2018-10-05 DIAGNOSIS — D62 Acute posthemorrhagic anemia: Secondary | ICD-10-CM | POA: Diagnosis not present

## 2018-10-05 DIAGNOSIS — I7090 Unspecified atherosclerosis: Secondary | ICD-10-CM | POA: Diagnosis not present

## 2018-10-05 DIAGNOSIS — I5032 Chronic diastolic (congestive) heart failure: Secondary | ICD-10-CM | POA: Diagnosis not present

## 2018-10-05 DIAGNOSIS — R000 Tachycardia, unspecified: Secondary | ICD-10-CM | POA: Diagnosis not present

## 2018-10-05 DIAGNOSIS — Z880 Allergy status to penicillin: Secondary | ICD-10-CM | POA: Diagnosis not present

## 2018-10-05 DIAGNOSIS — S22050A Wedge compression fracture of T5-T6 vertebra, initial encounter for closed fracture: Secondary | ICD-10-CM | POA: Diagnosis not present

## 2018-10-05 DIAGNOSIS — J811 Chronic pulmonary edema: Secondary | ICD-10-CM | POA: Diagnosis not present

## 2018-10-05 DIAGNOSIS — Z981 Arthrodesis status: Secondary | ICD-10-CM | POA: Diagnosis not present

## 2018-10-05 DIAGNOSIS — I959 Hypotension, unspecified: Secondary | ICD-10-CM | POA: Diagnosis not present

## 2018-10-05 DIAGNOSIS — Z79899 Other long term (current) drug therapy: Secondary | ICD-10-CM | POA: Diagnosis not present

## 2018-10-05 DIAGNOSIS — M792 Neuralgia and neuritis, unspecified: Secondary | ICD-10-CM | POA: Diagnosis not present

## 2018-10-05 DIAGNOSIS — M6281 Muscle weakness (generalized): Secondary | ICD-10-CM | POA: Diagnosis not present

## 2018-10-05 DIAGNOSIS — E872 Acidosis: Secondary | ICD-10-CM | POA: Diagnosis not present

## 2018-10-05 DIAGNOSIS — K2970 Gastritis, unspecified, without bleeding: Secondary | ICD-10-CM | POA: Diagnosis not present

## 2018-10-05 DIAGNOSIS — M549 Dorsalgia, unspecified: Secondary | ICD-10-CM | POA: Diagnosis not present

## 2018-10-05 DIAGNOSIS — M4804 Spinal stenosis, thoracic region: Secondary | ICD-10-CM | POA: Diagnosis not present

## 2018-10-05 DIAGNOSIS — M545 Low back pain: Secondary | ICD-10-CM | POA: Diagnosis not present

## 2018-10-05 DIAGNOSIS — M85872 Other specified disorders of bone density and structure, left ankle and foot: Secondary | ICD-10-CM | POA: Diagnosis not present

## 2018-10-05 DIAGNOSIS — M7989 Other specified soft tissue disorders: Secondary | ICD-10-CM | POA: Diagnosis not present

## 2018-10-05 DIAGNOSIS — S22050S Wedge compression fracture of T5-T6 vertebra, sequela: Secondary | ICD-10-CM | POA: Diagnosis not present

## 2018-10-05 DIAGNOSIS — I252 Old myocardial infarction: Secondary | ICD-10-CM | POA: Diagnosis not present

## 2018-10-05 DIAGNOSIS — M5440 Lumbago with sciatica, unspecified side: Secondary | ICD-10-CM | POA: Diagnosis not present

## 2018-10-05 MED ORDER — POLYETHYLENE GLYCOL 3350 17 G PO PACK: 17.0000 g | PACK | Freq: Every day | ORAL | 0 refills | Status: DC

## 2018-10-05 MED ORDER — PREDNISONE 20 MG PO TABS: 40.0000 mg | ORAL_TABLET | Freq: Every day | ORAL | 0 refills | Status: AC

## 2018-10-05 MED ORDER — LIDOCAINE 5 % EX PTCH: 1.0000 | MEDICATED_PATCH | CUTANEOUS | 0 refills | Status: AC

## 2018-10-05 MED ORDER — LACTULOSE 10 GM/15ML PO SOLN: 20.0000 g | Freq: Two times a day (BID) | ORAL | 0 refills | Status: DC | PRN

## 2018-10-05 MED ORDER — ALPRAZOLAM 0.5 MG PO TABS: 0.5000 mg | ORAL_TABLET | Freq: Three times a day (TID) | ORAL | 0 refills | Status: DC

## 2018-10-05 MED ORDER — DOCUSATE SODIUM 100 MG PO CAPS: 100.0000 mg | ORAL_CAPSULE | Freq: Two times a day (BID) | ORAL | 0 refills | Status: DC

## 2018-10-05 MED ORDER — OXYCODONE-ACETAMINOPHEN 5-325 MG PO TABS: 1.0000 | ORAL_TABLET | Freq: Four times a day (QID) | ORAL | 0 refills | Status: AC | PRN

## 2018-10-05 NOTE — Progress Notes (Addendum)
Patient fell at approximately 0315 from sitting on the edge of the bed, down to floor. Pnt put back into bed using gait belt and 2 staff members. Assessment skin and no new bruising or distress. Pnt given pain medicine at this time for her chronic back pain and acute T compression fx. Pnt does not report any discomfort other than baseline.   High falls protocol initiated. Bed alarm on and pnt educated and agrees to comply with calling for assistance.  Low bed ordered. MD Montez MoritaJitendra Patel notified. Requested to add 3 occurrences of neuro checks. Ill complete as ordered along with post fall protocol.

## 2018-10-05 NOTE — Progress Notes (Signed)
RT to patient bedside for scheduled breathing treatment. Patient on 3L [place] SAT at 97%. RN reported patient has fallen out of bed and took Bipap off, resting in bed on [place]. Patient states she slipped out of bed trying to get to the bathroom, called RN for assistance. Patient not in distress. Scheduled breathing treatment given and patient placed back on Bipap. RN at bedside. Patient given instructions to call for assistance and not to get out of bed. RN at bedside, bed alarm on. Patient resting comfortably in bed.

## 2018-10-05 NOTE — Evaluation (Signed)
Physical Therapy Evaluation Patient Details Name: Caitlyn Evans MRN: [id number] DOB: Feb 17, 1966 Today's Date: 10/05/2018   History of Present Illness  Pt is a 53 year old female admitted for acute respiratory failure with hypoxia.  Hx of T6 kyphoplasty 09/17/2018 and pt also fell in her home 3 days ago and sustained a T5 fracture.  PMH includes anxiety, asthma, CHF, emphysema, MI and sternal fracture.    Clinical Impression  Patient had fall in room early this morning trying to get up to [facility]. States her legs are just weak and they gave out and she slid to the floor.  RN requested that I come and re-evaluate patient as she is planning to go home. Patient received in bed reporting that she is unable to walk. Patient is very shaky, impulsive. Patient agrees to try to ambulate as she really wants to go home. Reporting a lot of pain which is making her shaking worse. Patient requires min assist with bed mobility, is able to perform sit >< stand transfers with min guard and rw. Patient then ambulated 15' in room with rw and min guard assist. Ambulation distance limited by reported SOB. Reports 8/10 on RPE. O2 sats between 95%-97%. Patient cued to perform pursed lip breathing. Patient reports she will have husband's assist at home and has walker. She agrees to HHPT.      Follow Up Recommendations Home health PT    Equipment Recommendations  3in1 (PT)    Recommendations for Other Services       Precautions / Restrictions Precautions Precautions: Fall;Back Precaution Comments: T5 fracture, log rolls and avoidance of trunk flexion Restrictions Weight Bearing Restrictions: No      Mobility  Bed Mobility Overal bed mobility: Needs Assistance Bed Mobility: Sit to Supine;Supine to Sit Rolling: Modified independent (Device/Increase time);Min assist Sidelying to sit: Modified independent (Device/Increase time);Min assist Supine to sit: Min assist;Modified independent (Device/Increase time) Sit to  supine: Min assist;Modified independent (Device/Increase time) Sit to sidelying: Min assist;Modified independent (Device/Increase time) General bed mobility comments: requires assist to bring LEs up onto bed.    Transfers Overall transfer level: Needs assistance Equipment used: Rolling walker (2 wheeled) Transfers: Sit to/from Stand Sit to Stand: Min guard;Modified independent (Device/Increase time)            Ambulation/Gait Ambulation/Gait assistance: Min guard;Modified independent (Device/Increase time) Gait Distance (Feet): 15 Feet Assistive device: Rolling walker (2 wheeled) Gait Pattern/deviations: Step-to pattern;Decreased step length - left;Decreased step length - right Gait velocity: decreased   General Gait Details: patient limited by pain and SOB during activity  Stairs            Wheelchair Mobility    Modified Rankin (Stroke Patients Only)       Balance Overall balance assessment: Needs assistance;Modified Independent Sitting-balance support: Feet supported;Single extremity supported Sitting balance-Leahy Scale: Fair     Standing balance support: Bilateral upper extremity supported Standing balance-Leahy Scale: Fair                               Pertinent Vitals/Pain Pain Assessment: 0-10 Pain Score: 8  Pain Location: mid back pain Pain Descriptors / Indicators: Radiating;Aching;Sharp Pain Intervention(s): Limited activity within patient's tolerance;Monitored during session    Home Living Family/patient expects to be discharged to:: Private residence Living Arrangements: Spouse/significant other;Children Available Help at Discharge: Family Type of Home: House Home Access: Ramped entrance Entrance Stairs-Rails: None Entrance Stairs-Number of Steps: ramp in back,  steps in front Home Layout: One level Home Equipment: Walker - 2 wheels;Cane - single point;Shower seat;Grab bars - tub/shower;Wheelchair - manual Additional Comments:  pt reports family is going to get a toilet riser    Prior Function Level of Independence: Independent         Comments: Goes to gym to walk on treadmill; endorses "furniture cruising"     Hand Dominance   Dominant Hand: Right    Extremity/Trunk Assessment   Upper Extremity Assessment Upper Extremity Assessment: Defer to OT evaluation    Lower Extremity Assessment Lower Extremity Assessment: Generalized weakness    Cervical / Trunk Assessment Cervical / Trunk Assessment: Other exceptions(T5 compression fx)  Communication   Communication: No difficulties  Cognition Arousal/Alertness: Awake/alert Behavior During Therapy: WFL for tasks assessed/performed Overall Cognitive Status: Within Functional Limits for tasks assessed                                 General Comments: mildly impulsive, requires cues      General Comments      Exercises     Assessment/Plan    PT Assessment Patient needs continued PT services  PT Problem List Decreased strength;Decreased mobility;Decreased safety awareness;Decreased activity tolerance;Decreased knowledge of precautions;Cardiopulmonary status limiting activity;Decreased balance;Pain       PT Treatment Interventions Functional mobility training;DME instruction;Gait training;Therapeutic activities;Stair training;Therapeutic exercise;Neuromuscular re-education;Balance training;Patient/family education    PT Goals (Current goals can be found in the Care Plan section)  Acute Rehab PT Goals Patient Stated Goal: To return to working out at the gym PT Goal Formulation: With patient Time For Goal Achievement: 10/05/18 Potential to Achieve Goals: Fair    Frequency Min 2X/week   Barriers to discharge        Co-evaluation               AM-PAC PT "6 Clicks" Mobility  Outcome Measure Help needed turning from your back to your side while in a flat bed without using bedrails?: A Little Help needed moving from  lying on your back to sitting on the side of a flat bed without using bedrails?: A Little Help needed moving to and from a bed to a chair (including a wheelchair)?: A Little Help needed standing up from a chair using your arms (e.g., wheelchair or bedside chair)?: A Little Help needed to walk in hospital room?: A Little Help needed climbing 3-5 steps with a railing? : A Little 6 Click Score: 18    End of Session Equipment Utilized During Treatment: Gait belt;Oxygen Activity Tolerance: Patient limited by fatigue;Patient limited by pain Patient left: in bed;with call bell/phone within reach;with bed alarm set Nurse Communication: Mobility status PT Visit Diagnosis: Other abnormalities of gait and mobility (R26.89);Muscle weakness (generalized) (M62.81);Repeated falls (R29.6);Pain;Unsteadiness on feet (R26.81)    Time: 1005-1030 PT Time Calculation (min) (ACUTE ONLY): 25 min   Charges:   PT Evaluation $PT Re-evaluation: 1 Re-eval          Demaurion Dicioccio, PT, GCS 10/05/18,10:48 AM

## 2018-10-05 NOTE — Discharge Summary (Signed)
[facility] [place] [facility]   PATIENT NAME: Caitlyn Evans    MR#:  [id number]  DATE OF BIRTH:  1966/03/29  DATE OF ADMISSION:  09/30/2018   ADMITTING PHYSICIAN: Ramonita Lab, MD  DATE OF DISCHARGE:  10/05/18  PRIMARY CARE PHYSICIAN: Corky Downs, MD   ADMISSION DIAGNOSIS:   COPD exacerbation (HCC) [J44.1] Acute on chronic respiratory failure, unspecified whether with hypoxia or hypercapnia (HCC) [J96.20]  DISCHARGE DIAGNOSIS:   Active Problems:   Acute respiratory failure with hypoxia (HCC)   SECONDARY DIAGNOSIS:   Past Medical History:  Diagnosis Date  . Anxiety    panic attacks  . Asthma   . BOOP (bronchiolitis obliterans with organizing pneumonia) (HCC)   . CHF (congestive heart failure) (HCC)   . Dysphagia, pharyngoesophageal phase 05/30/2015  . Emphysema lung (HCC) 05/30/2015   on steroids and nebulizer for copd issues.  09/15/18  . GERD (gastroesophageal reflux disease)   . Hypercholesterolemia   . Hypertension   . Lung mass   . Migraines   . Motion sickness    all moving vehicles  . Myocardial infarction (HCC) 2012  . Respiratory failure with hypercapnia (HCC) 08/2018   was hospitalized and on bipap. steroids and nebulizer treatments helped  . Seasonal allergies   . Shortness of breath dyspnea    1 flight-stairs  . Sternal fracture     HOSPITAL COURSE:   53 year old female with past medical history significant for COPD on 3 L home oxygen, ongoing smoking, Boop, CHF, GERD and hypertension presents to hospital secondary to a fall and shortness of breath.  1.  Acute hypoxic hypercarbic respiratory failure-secondary to COPD exacerbation-improved after BiPAP -Patient continues to smoke.  Counseled - Now weaned off Bipap and on 3 L oxygen which is her chronic home oxygen -on IV steroids, nebs and inhalers at this time.  Change to oral steroids at discharge.  2.  Fall and T5 compression fracture-appreciate orthopedics consult -  ortho or IR Not available for kyphoplasty at this time -MRI of the thoracic spine reviewed and patient has acute T5 compression fracture with 60% height loss. -Recent T6 kyphoplasty done by Ortho. -As pain is limiting movement and patient is in severe distress, reached out to IR.    However given the high location of the vertebral compression fracture, either the interventional radiologist at our hospital or [facility] or unable to do this procedure at this time. -Continue pain medications and follow-up with Dr. Trilby Drummer as outpatient for kyphoplasty next week  3.  CHF-has diastolic dysfunction.  EF of 55 to 65% on recent echo -Well compensated.  Continue oral Lasix at this time.  4.  GERD-on Protonix  5.  Hypertension history of sinus tachycardia-continue Lopressor and Cardizem  6.  Leukocytosis-likely stress reaction from recent fracture and also from steroids.  Continue to monitor.  No source of infection identified -Urine analysis without any infection -Need to follow-up as outpatient  Physical therapy worked with the patient.  She is very shaky and impulsive.  She was able to ambulate about 15 feet with minimal guard assist in the room prior to discharge.  She is very adamant and wants to be discharged home right now.   DISCHARGE CONDITIONS:   Guarded  CONSULTS OBTAINED:   Treatment Team:  Pccm, Raymond Gurney, MD Donato Heinz, MD  DRUG ALLERGIES:   Allergies  Allergen Reactions  . Codeine Hives, Nausea And Vomiting and Nausea Only  . Penicillins Hives and Other (See  Comments)    Has patient had a PCN reaction causing immediate rash, facial/tongue/throat swelling, SOB or lightheadedness with hypotension: No Has patient had a PCN reaction causing severe rash involving mucus membranes or skin necrosis: No Has patient had a PCN reaction that required hospitalization No Has patient had a PCN reaction occurring within the last 10 years: No If all of the above answers are  "NO", then may proceed with Cephalosporin use. Other reaction(s): Other (See Comments) Has patient had a PCN   DISCHARGE MEDICATIONS:   Allergies as of 10/05/2018      Reactions   Codeine Hives, Nausea And Vomiting, Nausea Only   Penicillins Hives, Other (See Comments)   Has patient had a PCN reaction causing immediate rash, facial/tongue/throat swelling, SOB or lightheadedness with hypotension: No Has patient had a PCN reaction causing severe rash involving mucus membranes or skin necrosis: No Has patient had a PCN reaction that required hospitalization No Has patient had a PCN reaction occurring within the last 10 years: No If all of the above answers are "NO", then may proceed with Cephalosporin use. Other reaction(s): Other (See Comments) Has patient had a PCN      Medication List    STOP taking these medications   clindamycin 300 MG capsule Commonly known as:  CLEOCIN     TAKE these medications   ALPRAZolam 0.5 MG tablet Commonly known as:  XANAX Take 1 tablet (0.5 mg total) by mouth 3 (three) times daily.   diltiazem 180 MG 24 hr capsule Commonly known as:  CARDIZEM CD Take 1 capsule (180 mg total) by mouth daily.   docusate sodium 100 MG capsule Commonly known as:  COLACE Take 1 capsule (100 mg total) by mouth 2 (two) times daily.   donepezil 10 MG tablet Commonly known as:  ARICEPT Take 10 mg by mouth daily.   furosemide 20 MG tablet Commonly known as:  LASIX Take 1 tablet (20 mg total) by mouth daily.   guaiFENesin 100 MG/5ML Soln Commonly known as:  ROBITUSSIN Take 5 mLs (100 mg total) by mouth every 4 (four) hours as needed for cough or to loosen phlegm.   ipratropium-albuterol 0.5-2.5 (3) MG/3ML Soln Commonly known as:  DUONEB Inhale 3 mLs into the lungs every 4 (four) hours as needed (for wheezing/shortness of breath). What changed:    reasons to take this  additional instructions   lactulose 10 GM/15ML solution Commonly known as:   CHRONULAC Take 30 mLs (20 g total) by mouth 2 (two) times daily as needed for mild constipation.   lidocaine 5 % Commonly known as:  LIDODERM Place 1 patch onto the skin daily. Remove & Discard patch within 12 hours or as directed by MD   metoprolol tartrate 25 MG tablet Commonly known as:  LOPRESSOR Take 1 tablet (25 mg total) by mouth 2 (two) times daily.   oxyCODONE-acetaminophen 5-325 MG tablet Commonly known as:  PERCOCET Take 1-2 tablets by mouth every 6 (six) hours as needed for up to 5 days for moderate pain or severe pain. What changed:    how much to take  reasons to take this   pantoprazole 40 MG tablet Commonly known as:  PROTONIX TAKE ONE TABLET BY MOUTH TWICE DAILY   polyethylene glycol packet Commonly known as:  MIRALAX / GLYCOLAX Take 17 g by mouth daily.   predniSONE 20 MG tablet Commonly known as:  DELTASONE Take 2 tablets (40 mg total) by mouth daily for 6 days. What changed:  medication strength  how much to take  how to take this  when to take this  additional instructions   rosuvastatin 10 MG tablet Commonly known as:  CRESTOR Take 10 mg by mouth daily.   tamsulosin 0.4 MG Caps capsule Commonly known as:  FLOMAX Take 1 capsule (0.4 mg total) by mouth daily.        DISCHARGE INSTRUCTIONS:   1.  PCP follow-up in 1 to 2 weeks 2.  Orthopedics follow-up within 1 week for kyphoplasty  DIET:   Cardiac diet  ACTIVITY:   Activity as tolerated  OXYGEN:   Home Oxygen: Yes.    Oxygen Delivery: 3 liters/min via Patient connected to nasal cannula oxygen  DISCHARGE LOCATION:   home   If you experience worsening of your admission symptoms, develop shortness of breath, life threatening emergency, suicidal or homicidal thoughts you must seek medical attention immediately by calling 911 or calling your MD immediately  if symptoms less severe.  You Must read complete instructions/literature along with all the possible adverse  reactions/side effects for all the Medicines you take and that have been prescribed to you. Take any new Medicines after you have completely understood and accpet all the possible adverse reactions/side effects.   Please note  You were cared for by a hospitalist during your hospital stay. If you have any questions about your discharge medications or the care you received while you were in the hospital after you are discharged, you can call the unit and asked to speak with the hospitalist on call if the hospitalist that took care of you is not available. Once you are discharged, your primary care physician will handle any further medical issues. Please note that NO REFILLS for any discharge medications will be authorized once you are discharged, as it is imperative that you return to your primary care physician (or establish a relationship with a primary care physician if you do not have one) for your aftercare needs so that they can reassess your need for medications and monitor your lab values.    On the day of Discharge:  VITAL SIGNS:   Blood pressure (!) 146/82, pulse 86, temperature 98.3 F (36.8 C), temperature source Oral, resp. rate (!) 24, height 4\' 10"  (1.473 m), weight 52.2 kg, SpO2 97 %.  PHYSICAL EXAMINATION:    GENERAL:  53 y.o.-year-old patient lying in the bed, upper back pain secondary to fracture. EYES: Pupils equal, round, reactive to light and accommodation. No scleral icterus. Extraocular muscles intact.  HEENT: Head atraumatic, normocephalic. Oropharynx and nasopharynx clear.  NECK:  Supple, no jugular venous distention. No thyroid enlargement, no tenderness.  LUNGS: Moving air bilaterally, has occasional scattered wheezing, no rales,rhonchi or crepitation. No use of accessory muscles of respiration.  CARDIOVASCULAR: S1, S2 normal. No murmurs, rubs, or gallops.  ABDOMEN: Soft, nontender, nondistended. Bowel sounds present. No organomegaly or mass.  EXTREMITIES: No pedal  edema, cyanosis, or clubbing.  NEUROLOGIC: Cranial nerves II through XII are intact. Muscle strength 5/5 in all extremities. Sensation intact. Gait not checked.  PSYCHIATRIC: The patient is alert and oriented x 3.  SKIN: No obvious rash, lesion, or ulcer.   DATA REVIEW:   CBC Recent Labs  Lab 10/04/18 0331  WBC 26.0*  HGB 10.6*  HCT 35.4*  PLT 349    Chemistries  Recent Labs  Lab 10/04/18 0331  NA 141  K 4.9  CL 100  CO2 37*  GLUCOSE 173*  BUN 22*  CREATININE 0.62  CALCIUM  8.6*     Microbiology Results  Results for orders placed or performed during the hospital encounter of 09/30/18  MRSA PCR Screening     Status: None   Collection Time: 09/30/18  9:02 PM  Result Value Ref Range Status   MRSA by PCR NEGATIVE NEGATIVE Final    Comment:        The GeneXpert MRSA Assay (FDA approved for NASAL specimens only), is one component of a comprehensive MRSA colonization surveillance program. It is not intended to diagnose MRSA infection nor to guide or monitor treatment for MRSA infections. Performed at [facility], [address] [place], [place] [address]     RADIOLOGY:  No results found.   Management plans discussed with the patient, family and they are in agreement.  CODE STATUS:  Code Status History    Date Active Date Inactive Code Status Order ID Comments User Context   09/22/2018 2017 09/26/2018 1435 Full Code [id number]  Milagros Loll, MD ED   09/17/2018 1005 09/17/2018 1334 Full Code [id number]  Kennedy Bucker, MD Inpatient   09/13/2018 1104 09/15/2018 1529 Full Code [id number]  Alford Highland, MD ED   09/09/2018 2113 09/10/2018 1939 Full Code [id number]  Mayo, Allyn Kenner, MD ED   08/31/2018 1507 09/02/2018 1509 Full Code [id number]  Ihor Austin, MD ED   11/18/2017 2000 11/25/2017 1802 Full Code [id number]  Ihor Austin, MD ED   07/01/2017 2023 07/04/2017 1648 Full Code [id number]  Altamese Dilling, MD ED   07/19/2016 1148 07/20/2016 2010 Full  Code [id number]  Adrian Saran, MD Inpatient   05/09/2016 1440 05/13/2016 1437 Full Code [id number]  Merwyn Katos, MD ED   02/05/2016 1914 02/08/2016 1452 Full Code [id number]  Ramonita Lab, MD Inpatient   12/05/2015 2302 12/09/2015 1535 Full Code [id number]  Oralia Manis, MD Inpatient    Advance Directive Documentation     Most Recent Value  Type of Advance Directive  Healthcare Power of Attorney  Pre-existing out of facility DNR order (yellow form or pink MOST form)  -  "MOST" Form in Place?  -      TOTAL TIME TAKING CARE OF THIS PATIENT: 38 minutes.    Enid Baas M.D on 10/05/2018 at 11:30 AM  Between 7am to 6pm - Pager - [phone]  After 6pm go to [email or web address] - Social research officer, government  [facility]  Office  [phone]  CC: Primary care physician; Corky Downs, MD   Note: This dictation was prepared with Dragon dictation along with smaller phrase technology. Any transcriptional errors that result from this process are unintentional.

## 2018-10-05 NOTE — Care Management (Signed)
Patient called in after going home and stated that she is not getting around as well as she liked.  I contacted Jermaine with [facility] and requested to set up a SW eval to determine the patient's needs He agreed.

## 2018-10-05 NOTE — Progress Notes (Signed)
Size wise bed ordered for pt. Confirmation number is [id number]

## 2018-10-05 NOTE — Progress Notes (Signed)
IR requested by Dr. Nemiah Commander for possible image-guided T5 kyphoplasty/vertebroplasty.  MRI thoracic spine from 10/01/2018 reviewed by Dr. Corliss Skains who states that patient's T5 vertebral body is too compressed for augmentation, so procedure cannot occur. Called Dr. Nemiah Commander [phone]) at [phone] to make aware.  All questions answered and concerns addressed. IR available in future if needed.  Waylan Boga Louk, PA-C 10/05/2018, 9:42 AM

## 2018-10-05 NOTE — Discharge Planning (Signed)
Patient IV removed.  RN assessment revealed stability for DC to home with [facility] for O2 and PT  ([facility]).  Discharge papers given,explained and educated.  Informed of suggested FU appts and appts made.  Signed pain script given and other e-scribed to [facility] [address], [place]) per patinet request.  Once ready, will be wheeled to front and family transporting home via car.

## 2018-10-05 NOTE — Progress Notes (Signed)
Clinical Child psychotherapist (CSW) received verbal consult from RN that patient discharged home today and called back to [facility] asking to be placed in a SNF. FL2 complete and faxed out. Patient has an existing SNF level PASARR. CSW contacted patient at home and made her aware of above. Patient reported that she needs to go to SNF and does not have a preference of which one she goes to. CSW explained that her insurance [employer] will have to approve SNF and could take 24 to 48 hours to make a decision. Patient verbalized her understanding.   CSW presented 2 bed offers to patient [facility] and [facility]. CSW discussed the quality measures of the facilities and patient chose [facility]. Per Inetta Fermo [facility] liaison she will start [facility] SNF authorization today and will call patient at home and let her know [facility] decision. Per patient she is on chronic 3 liters of oxygen at home and she can have a family member transport her to [facility] if [facility] approves SNF. Per patient she does not have a c-pap or bi-pap at home. [facility] will continue to work on NIV at home. RN case Production designer, theatre/television/film, RN and MD aware of above.   [employer], LCSW [phone]

## 2018-10-05 NOTE — Care Management (Signed)
Notified [facility] that patient is DC home today

## 2018-10-05 NOTE — [place] FL2 (Signed)
[place] MEDICAID FL2 LEVEL OF CARE SCREENING TOOL     IDENTIFICATION  Patient Name: Caitlyn Evans Birthdate: 1965/10/07 Sex: female Admission Date (Current Location): 09/30/2018  [place] and [place] Number:  Chiropodist and Address:  [facility], [address], [place], [place] [address]      Provider Number: [id number]  Attending Physician Name and Address:  No att. providers found  Relative Name and Phone Number:       Current Level of Care: Hospital Recommended Level of Care: Skilled Nursing Facility Prior Approval Number:    Date Approved/Denied:   PASRR Number: [id number])  Discharge Plan: SNF    Current Diagnoses: Patient Active Problem List   Diagnosis Date Noted  . Acute on chronic respiratory failure with hypoxia (HCC) 09/09/2018  . Esophagitis, unspecified   . Stomach irritation   . Hiatal hernia   . COPD (chronic obstructive pulmonary disease) (HCC) 07/19/2016  . Acute on chronic respiratory failure (HCC) 05/09/2016  . Noncompliance with medication regimen 05/09/2016  . COPD with acute exacerbation (HCC) 02/05/2016  . Acute exacerbation of chronic obstructive pulmonary disease (COPD) (HCC)   . Acute respiratory failure (HCC)   . Acute respiratory failure with hypoxia (HCC) 12/05/2015  . BOOP (bronchiolitis obliterans with organizing pneumonia) (HCC) 12/05/2015  . Depression, major, single episode, moderate (HCC) 07/14/2015  . Adjustment disorder with anxiety 07/14/2015  . COPD exacerbation (HCC) 07/14/2015  . Special screening for malignant neoplasms, colon   . Swallowing difficulty   . Loss of weight   . Esophagogastric ulcer   . Feline esophagus   . Hypertension 05/30/2015  . Emphysema lung (HCC) 05/30/2015  . Dysphagia, pharyngoesophageal phase 05/30/2015    Orientation RESPIRATION BLADDER Height & Weight     Self, Time, Situation, Place  O2(3 Liters Oxygen. ) Continent Weight: 115 lb (52.2  kg) Height:  4\' 10"  (147.3 cm)  BEHAVIORAL SYMPTOMS/MOOD NEUROLOGICAL BOWEL NUTRITION STATUS      Incontinent Diet(Diet: Low Sodium/ Heart Healthy )  AMBULATORY STATUS COMMUNICATION OF NEEDS Skin   Extensive Assist Verbally Normal                       Personal Care Assistance Level of Assistance  Bathing, Feeding, Dressing Bathing Assistance: Limited assistance Feeding assistance: Independent Dressing Assistance: Limited assistance     Functional Limitations Info  Sight, Hearing, Speech Sight Info: Adequate Hearing Info: Adequate Speech Info: Adequate    SPECIAL CARE FACTORS FREQUENCY  PT (By licensed PT), OT (By licensed OT)     PT Frequency: (5) OT Frequency: (5)            Contractures      Additional Factors Info  Code Status, Allergies Code Status Info: (Full Code. ) Allergies Info: (Codeine, Penicillins)           Current Medications (10/05/2018):  This is the current hospital active medication list Current Facility-Administered Medications  Medication Dose Route Frequency Provider Last Rate Last Dose  . 0.9 %  sodium chloride infusion   Intravenous PRN Enid Baas, MD 10 mL/hr at 10/02/18 2316 250 mL at 10/02/18 2316  . albuterol (PROVENTIL) (2.5 MG/3ML) 0.083% nebulizer solution 2.5 mg  2.5 mg Nebulization Q2H PRN Gouru, Aruna, MD   2.5 mg at 10/05/18 0263  . ALPRAZolam Prudy Feeler) tablet 0.5 mg  0.5 mg Oral TID Harlon Ditty D, NP   0.5 mg at 10/05/18 7858  . bisacodyl (DULCOLAX) EC tablet 10 mg  10 mg Oral Daily Enid Baas, MD   10 mg at 10/05/18 3646  . budesonide (PULMICORT) nebulizer solution 0.5 mg  0.5 mg Nebulization BID Eugenie Norrie, NP   0.5 mg at 10/05/18 0741  . diltiazem (CARDIZEM CD) 24 hr capsule 180 mg  180 mg Oral Daily Harlon Ditty D, NP   180 mg at 10/05/18 8032  . docusate sodium (COLACE) capsule 100 mg  100 mg Oral BID Eugenie Norrie, NP   100 mg at 10/05/18 1224  . donepezil (ARICEPT) tablet 10 mg  10 mg  Oral Daily Harlon Ditty D, NP   10 mg at 10/05/18 8250  . enoxaparin (LOVENOX) injection 40 mg  40 mg Subcutaneous Q24H Ogan, [place]wo U, MD      . fentaNYL (DURAGESIC) 12 MCG/HR 1 patch  1 patch Transdermal Q72H Arbie Cookey, MD   1 patch at 10/02/18 1534  . furosemide (LASIX) tablet 20 mg  20 mg Oral Daily Harlon Ditty D, NP   20 mg at 10/04/18 1048  . guaiFENesin (ROBITUSSIN) 100 MG/5ML solution 100 mg  5 mL Oral Q4H PRN Harlon Ditty D, NP   100 mg at 10/04/18 2301  . ipratropium-albuterol (DUONEB) 0.5-2.5 (3) MG/3ML nebulizer solution 3 mL  3 mL Nebulization Q4H Don Perking, [place], MD   3 mL at 10/05/18 1133  . lactulose (CHRONULAC) 10 GM/15ML solution 20 g  20 g Oral BID PRN Enid Baas, MD      . lidocaine (LIDODERM) 5 % 1 patch  1 patch Transdermal Q24H Enid Baas, MD   1 patch at 10/04/18 1152  . methylPREDNISolone sodium succinate (SOLU-MEDROL) 125 mg/2 mL injection 60 mg  60 mg Intravenous Q12H Enid Baas, MD   60 mg at 10/05/18 0921  . metoprolol tartrate (LOPRESSOR) tablet 25 mg  25 mg Oral BID Harlon Ditty D, NP   25 mg at 10/05/18 0932  . morphine 2 MG/ML injection 1-2 mg  1-2 mg Intravenous Q3H PRN Harlon Ditty D, NP   2 mg at 10/05/18 0609  . multivitamin with minerals tablet 1 tablet  1 tablet Oral Daily Enid Baas, MD   1 tablet at 10/05/18 0370  . oxyCODONE-acetaminophen (PERCOCET/ROXICET) 5-325 MG per tablet 1-2 tablet  1-2 tablet Oral Q6H PRN Enid Baas, MD   1 tablet at 10/05/18 1202  . pantoprazole (PROTONIX) EC tablet 40 mg  40 mg Oral BID Harlon Ditty D, NP   40 mg at 10/05/18 4888  . patiromer Lelon Perla) packet 8.4 g  8.4 g Oral Daily Enid Baas, MD   8.4 g at 10/03/18 1348  . polyethylene glycol (MIRALAX / GLYCOLAX) packet 17 g  17 g Oral Daily Enid Baas, MD      . rosuvastatin (CRESTOR) tablet 10 mg  10 mg Oral Daily Harlon Ditty D, NP   10 mg at 10/05/18 9169  . sodium chloride flush (NS) 0.9  % injection 3 mL  3 mL Intravenous Once Don Perking, [place], MD      . tamsulosin [facility]) capsule 0.4 mg  0.4 mg Oral Daily Harlon Ditty D, NP   0.4 mg at 10/05/18 4503  . vitamin C (ASCORBIC ACID) tablet 500 mg  500 mg Oral BID Enid Baas, MD   500 mg at 10/05/18 8882   Current Outpatient Medications  Medication Sig Dispense Refill  . diltiazem (CARDIZEM CD) 180 MG 24 hr capsule Take 1 capsule (180 mg total) by mouth daily. 30 capsule 2  . donepezil (ARICEPT) 10 MG  tablet Take 10 mg by mouth daily.     . furosemide (LASIX) 20 MG tablet Take 1 tablet (20 mg total) by mouth daily. 30 tablet 2  . guaiFENesin (ROBITUSSIN) 100 MG/5ML SOLN Take 5 mLs (100 mg total) by mouth every 4 (four) hours as needed for cough or to loosen phlegm. 118 mL 0  . ipratropium-albuterol (DUONEB) 0.5-2.5 (3) MG/3ML SOLN Inhale 3 mLs into the lungs every 4 (four) hours as needed (for wheezing/shortness of breath). (Patient taking differently: Inhale 3 mLs into the lungs every 4 (four) hours as needed. For wheezing/shortness of breath) 360 mL 2  . metoprolol tartrate (LOPRESSOR) 25 MG tablet Take 1 tablet (25 mg total) by mouth 2 (two) times daily. 60 tablet 2  . pantoprazole (PROTONIX) 40 MG tablet TAKE ONE TABLET BY MOUTH TWICE DAILY (Patient taking differently: Take 40 mg by mouth 2 (two) times daily. ) 60 tablet 1  . rosuvastatin (CRESTOR) 10 MG tablet Take 10 mg by mouth daily.     . tamsulosin (FLOMAX) 0.4 MG CAPS capsule Take 1 capsule (0.4 mg total) by mouth daily. 30 capsule 2  . ALPRAZolam (XANAX) 0.5 MG tablet Take 1 tablet (0.5 mg total) by mouth 3 (three) times daily. 20 tablet 0  . docusate sodium (COLACE) 100 MG capsule Take 1 capsule (100 mg total) by mouth 2 (two) times daily. 10 capsule 0  . lactulose (CHRONULAC) 10 GM/15ML solution Take 30 mLs (20 g total) by mouth 2 (two) times daily as needed for mild constipation. 236 mL 0  . lidocaine (LIDODERM) 5 % Place 1 patch onto the skin daily. Remove  & Discard patch within 12 hours or as directed by MD 30 patch 0  . oxyCODONE-acetaminophen (PERCOCET) 5-325 MG tablet Take 1-2 tablets by mouth every 6 (six) hours as needed for up to 5 days for moderate pain or severe pain. 20 tablet 0  . polyethylene glycol (MIRALAX / GLYCOLAX) packet Take 17 g by mouth daily. 14 each 0  . predniSONE (DELTASONE) 20 MG tablet Take 2 tablets (40 mg total) by mouth daily for 6 days. 12 tablet 0     Discharge Medications: Please see discharge summary for a list of discharge medications.  Relevant Imaging Results:  Relevant Lab Results:   Additional Information (SSN: [id number])  Breckan Cafiero, Darleen CrockerBailey M, LCSW

## 2018-10-05 NOTE — Care Management Important Message (Signed)
Important Message  Patient Details  Name: Caitlyn Evans MRN: [id number] Date of Birth: 29-May-1966   Medicare Important Message Given:  Yes    Olegario Messier A Carvin Almas 10/05/2018, 11:33 AM

## 2018-10-06 ENCOUNTER — Encounter: Payer: Self-pay | Admitting: *Deleted

## 2018-10-06 ENCOUNTER — Other Ambulatory Visit: Payer: Self-pay | Admitting: *Deleted

## 2018-10-06 NOTE — Patient Outreach (Signed)
[facility] Customer service manager [facility]) Care Management [facility] Community CM Telephone Outreach-- Care Coordination PCP office completes Transition of Care follow up post-hospital discharge Post-hospital discharge day # 1  10/06/2018  Caitlyn Evans 1966-06-07 [id number]  [facility] Community CM Telephone Outreach Care Coordination x 2 re:  [name], 53 y/o female referred to [facility] Community CM by [facility] RN [facility] Liaison on 09/29/2018 for recent hospitalization January 7-11, 202 for acute on chronic respiratory failure with hypoxia; unfortunately, patient re-admitted to hospital one day after referral received on January 15-20, 2020, again for acute on chronic respiratory failure with hypoxia secondary to COPD exacerbation.  Patient has experienced mechanical fall at home in between her two recent hospitalizations, and was also diagnosed with new T-5 compression fracture.    Patient was discharged home to self-care with home health services in place on October 05, 2018- however, within hours of her discharge home, patient contacted [facility] in-patient care management team, stating that she had reconsidered and now wished to go to SNF for rehabilitation, as offered during most recent hospitalization; review of EMR indicated that [facility] CSW Hendricks Sample had completed FL-2 and contacted [facility] yesterday 10/05/2018 to facilitate patient's stated desire for rehabilitation at [facility].  09:30/ 09:45-- Harlon Flor Sample/ [facility] CSW [phone] to confirm that FL-2 had been completed; Fredric Mare confirms this and stated that Inetta Fermo at [employer] is currently working with [employer] for approval of short-term rehabilitation; Fredric Mare provided contact number for [place] at [facility]   10:15: [employer] [phone], who confirms that she submitted request for SNF rehabilitation visit with patient's insurance provider, and would hopefully be hearing back with approval status soon; Inetta Fermo agreed to  contact me in follow up once she receives confirmation of request status on placement by patient's insurance provider.  Plan:  Will await follow up from [facility] for confirmation of outcome of patient's request for SNF/ rehabilitation request post-hospital discharge  Caryl Pina, RN, BSN, [employer] Coordinator [facility] Care Management  [phone]

## 2018-10-07 DIAGNOSIS — M4804 Spinal stenosis, thoracic region: Secondary | ICD-10-CM | POA: Diagnosis not present

## 2018-10-07 DIAGNOSIS — J449 Chronic obstructive pulmonary disease, unspecified: Secondary | ICD-10-CM | POA: Diagnosis not present

## 2018-10-08 ENCOUNTER — Other Ambulatory Visit: Payer: Self-pay | Admitting: *Deleted

## 2018-10-08 NOTE — Patient Outreach (Signed)
[facility] Customer service manager [facility]) Care Management [facility] Community CM Telephone Outreach Care Coordination-- Case Closure 10/08/2018  JISELL ARRUE [birth year] [id number]  [facility] Community CM Telephone Outreach Care Coordination re:    Mickayla Hortin, 53 y/o female referred to [facility] Community CM by [facility] RN [facility] Liaison on 09/29/2018 for recent hospitalization January 7-11, 202 for acute on chronic respiratory failure with hypoxia; unfortunately, patient re-admitted to hospital one day after referral received on January 15-20, 2020, again for acute on chronic respiratory failure with hypoxia secondary to COPD exacerbation.  Patient has experienced mechanical fall at home in between her two recent hospitalizations, and was also diagnosed with new T-5 compression fracture.    Patient was discharged home to self-care with home health services in place on October 05, 2018- however, within hours of her discharge home, patient contacted [facility] in-patient care management team, stating that she had reconsidered and now wished to go to SNF for rehabilitation, as offered during most recent hospitalization; review of EMR indicated that [facility] CSW [place] Sample had completed FL-2 and contacted [facility] 10/05/2018 to facilitate patient's stated desire for rehabilitation at [facility]. On October 06, 2018 I had spoken to [place], placement facilitator at [facility], who confirmed that she submitted request for SNF rehabilitation visit with patient's insurance provider.  08:50: Theodora Blow, placement facilitator at [facility] SNF: [phone]) Today, Inetta Fermo updated me that prior to receiving follow up from patient's insurance company, patient had experienced hospital re-admission and is now currently admitted at [facility]; Inetta Fermo stated that she had received temporary approval form [employer] for patient to have short-term SNF rehabilitation vsisit, however this would only be in place for approximately 7 days.   Inetta Fermo again agrees to keep me posted on patient's status should she admit to [facility] for rehabilitation.  Discussed with Inetta Fermo ongoing challenges [facility] CM team has had in establishing contact with patient.  Plan:  Will close patient's case/ referral and will collaborate as indicated with [facility] team pending patient's discharge disposition from current hospitalization at [facility], RN, BSN, CCRN [facility] Care Management  [phone]

## 2018-10-17 HISTORY — PX: BACK SURGERY: SHX140

## 2018-10-20 DIAGNOSIS — R0781 Pleurodynia: Secondary | ICD-10-CM | POA: Diagnosis not present

## 2018-10-20 DIAGNOSIS — J449 Chronic obstructive pulmonary disease, unspecified: Secondary | ICD-10-CM | POA: Diagnosis not present

## 2018-10-20 DIAGNOSIS — J441 Chronic obstructive pulmonary disease with (acute) exacerbation: Secondary | ICD-10-CM | POA: Diagnosis not present

## 2018-10-20 DIAGNOSIS — S22050D Wedge compression fracture of T5-T6 vertebra, subsequent encounter for fracture with routine healing: Secondary | ICD-10-CM | POA: Diagnosis not present

## 2018-10-20 DIAGNOSIS — S3982XA Other specified injuries of lower back, initial encounter: Secondary | ICD-10-CM | POA: Diagnosis not present

## 2018-10-20 DIAGNOSIS — G309 Alzheimer's disease, unspecified: Secondary | ICD-10-CM | POA: Diagnosis not present

## 2018-10-20 DIAGNOSIS — M5489 Other dorsalgia: Secondary | ICD-10-CM | POA: Diagnosis not present

## 2018-10-20 DIAGNOSIS — M549 Dorsalgia, unspecified: Secondary | ICD-10-CM | POA: Diagnosis not present

## 2018-10-20 DIAGNOSIS — R531 Weakness: Secondary | ICD-10-CM | POA: Diagnosis not present

## 2018-10-20 DIAGNOSIS — I7090 Unspecified atherosclerosis: Secondary | ICD-10-CM | POA: Diagnosis not present

## 2018-10-20 DIAGNOSIS — S24101D Unspecified injury at T1 level of thoracic spinal cord, subsequent encounter: Secondary | ICD-10-CM | POA: Diagnosis not present

## 2018-10-20 DIAGNOSIS — R29898 Other symptoms and signs involving the musculoskeletal system: Secondary | ICD-10-CM | POA: Diagnosis not present

## 2018-10-20 DIAGNOSIS — N3281 Overactive bladder: Secondary | ICD-10-CM | POA: Diagnosis not present

## 2018-10-20 DIAGNOSIS — E569 Vitamin deficiency, unspecified: Secondary | ICD-10-CM | POA: Diagnosis not present

## 2018-10-20 DIAGNOSIS — E119 Type 2 diabetes mellitus without complications: Secondary | ICD-10-CM | POA: Diagnosis not present

## 2018-10-20 DIAGNOSIS — M62838 Other muscle spasm: Secondary | ICD-10-CM | POA: Diagnosis not present

## 2018-10-20 DIAGNOSIS — R52 Pain, unspecified: Secondary | ICD-10-CM | POA: Diagnosis not present

## 2018-10-20 DIAGNOSIS — Z9889 Other specified postprocedural states: Secondary | ICD-10-CM | POA: Diagnosis not present

## 2018-10-20 DIAGNOSIS — M1712 Unilateral primary osteoarthritis, left knee: Secondary | ICD-10-CM | POA: Diagnosis not present

## 2018-10-20 DIAGNOSIS — M4324 Fusion of spine, thoracic region: Secondary | ICD-10-CM | POA: Diagnosis not present

## 2018-10-20 DIAGNOSIS — E1165 Type 2 diabetes mellitus with hyperglycemia: Secondary | ICD-10-CM | POA: Diagnosis not present

## 2018-10-20 DIAGNOSIS — I5030 Unspecified diastolic (congestive) heart failure: Secondary | ICD-10-CM | POA: Diagnosis not present

## 2018-10-20 DIAGNOSIS — I10 Essential (primary) hypertension: Secondary | ICD-10-CM | POA: Diagnosis not present

## 2018-10-20 DIAGNOSIS — Z9981 Dependence on supplemental oxygen: Secondary | ICD-10-CM | POA: Diagnosis not present

## 2018-10-20 DIAGNOSIS — R498 Other voice and resonance disorders: Secondary | ICD-10-CM | POA: Diagnosis not present

## 2018-10-20 DIAGNOSIS — K219 Gastro-esophageal reflux disease without esophagitis: Secondary | ICD-10-CM | POA: Diagnosis not present

## 2018-10-20 DIAGNOSIS — M5440 Lumbago with sciatica, unspecified side: Secondary | ICD-10-CM | POA: Diagnosis not present

## 2018-10-20 DIAGNOSIS — M792 Neuralgia and neuritis, unspecified: Secondary | ICD-10-CM | POA: Diagnosis not present

## 2018-10-20 DIAGNOSIS — M6281 Muscle weakness (generalized): Secondary | ICD-10-CM | POA: Diagnosis not present

## 2018-10-20 DIAGNOSIS — R0602 Shortness of breath: Secondary | ICD-10-CM | POA: Diagnosis not present

## 2018-10-20 DIAGNOSIS — R918 Other nonspecific abnormal finding of lung field: Secondary | ICD-10-CM | POA: Diagnosis not present

## 2018-10-20 DIAGNOSIS — K5909 Other constipation: Secondary | ICD-10-CM | POA: Diagnosis not present

## 2018-10-20 DIAGNOSIS — K5900 Constipation, unspecified: Secondary | ICD-10-CM | POA: Diagnosis not present

## 2018-10-20 DIAGNOSIS — R079 Chest pain, unspecified: Secondary | ICD-10-CM | POA: Diagnosis not present

## 2018-10-20 DIAGNOSIS — L89152 Pressure ulcer of sacral region, stage 2: Secondary | ICD-10-CM | POA: Diagnosis not present

## 2018-10-20 DIAGNOSIS — J9621 Acute and chronic respiratory failure with hypoxia: Secondary | ICD-10-CM | POA: Diagnosis not present

## 2018-10-20 DIAGNOSIS — E785 Hyperlipidemia, unspecified: Secondary | ICD-10-CM | POA: Diagnosis not present

## 2018-10-20 DIAGNOSIS — M546 Pain in thoracic spine: Secondary | ICD-10-CM | POA: Diagnosis not present

## 2018-10-20 DIAGNOSIS — J9601 Acute respiratory failure with hypoxia: Secondary | ICD-10-CM | POA: Diagnosis not present

## 2018-10-20 DIAGNOSIS — Z87891 Personal history of nicotine dependence: Secondary | ICD-10-CM | POA: Diagnosis not present

## 2018-10-20 DIAGNOSIS — R000 Tachycardia, unspecified: Secondary | ICD-10-CM | POA: Diagnosis not present

## 2018-10-20 DIAGNOSIS — M25562 Pain in left knee: Secondary | ICD-10-CM | POA: Diagnosis not present

## 2018-10-20 DIAGNOSIS — M25561 Pain in right knee: Secondary | ICD-10-CM | POA: Diagnosis not present

## 2018-10-20 DIAGNOSIS — G8929 Other chronic pain: Secondary | ICD-10-CM | POA: Diagnosis not present

## 2018-10-22 DIAGNOSIS — J449 Chronic obstructive pulmonary disease, unspecified: Secondary | ICD-10-CM | POA: Diagnosis not present

## 2018-10-22 DIAGNOSIS — M5489 Other dorsalgia: Secondary | ICD-10-CM | POA: Diagnosis not present

## 2018-10-22 DIAGNOSIS — K5900 Constipation, unspecified: Secondary | ICD-10-CM | POA: Diagnosis not present

## 2018-10-22 DIAGNOSIS — I10 Essential (primary) hypertension: Secondary | ICD-10-CM | POA: Diagnosis not present

## 2018-10-22 DIAGNOSIS — E119 Type 2 diabetes mellitus without complications: Secondary | ICD-10-CM | POA: Diagnosis not present

## 2018-10-26 DIAGNOSIS — M4324 Fusion of spine, thoracic region: Secondary | ICD-10-CM | POA: Diagnosis not present

## 2018-10-29 DIAGNOSIS — S22050D Wedge compression fracture of T5-T6 vertebra, subsequent encounter for fracture with routine healing: Secondary | ICD-10-CM | POA: Diagnosis not present

## 2018-10-29 DIAGNOSIS — M4324 Fusion of spine, thoracic region: Secondary | ICD-10-CM | POA: Diagnosis not present

## 2018-10-29 DIAGNOSIS — R531 Weakness: Secondary | ICD-10-CM | POA: Diagnosis not present

## 2018-10-29 DIAGNOSIS — S24101D Unspecified injury at T1 level of thoracic spinal cord, subsequent encounter: Secondary | ICD-10-CM | POA: Diagnosis not present

## 2018-10-29 DIAGNOSIS — Z87891 Personal history of nicotine dependence: Secondary | ICD-10-CM | POA: Diagnosis not present

## 2018-10-29 DIAGNOSIS — M25562 Pain in left knee: Secondary | ICD-10-CM | POA: Diagnosis not present

## 2018-10-29 DIAGNOSIS — Z9889 Other specified postprocedural states: Secondary | ICD-10-CM | POA: Diagnosis not present

## 2018-10-29 DIAGNOSIS — M25561 Pain in right knee: Secondary | ICD-10-CM | POA: Diagnosis not present

## 2018-10-30 DIAGNOSIS — L89152 Pressure ulcer of sacral region, stage 2: Secondary | ICD-10-CM | POA: Diagnosis not present

## 2018-11-02 DIAGNOSIS — M25562 Pain in left knee: Secondary | ICD-10-CM | POA: Diagnosis not present

## 2018-11-02 DIAGNOSIS — M25561 Pain in right knee: Secondary | ICD-10-CM | POA: Diagnosis not present

## 2018-11-02 DIAGNOSIS — R29898 Other symptoms and signs involving the musculoskeletal system: Secondary | ICD-10-CM | POA: Diagnosis not present

## 2018-11-02 DIAGNOSIS — M1712 Unilateral primary osteoarthritis, left knee: Secondary | ICD-10-CM | POA: Diagnosis not present

## 2018-11-02 DIAGNOSIS — G8929 Other chronic pain: Secondary | ICD-10-CM | POA: Diagnosis not present

## 2018-11-03 DIAGNOSIS — R0781 Pleurodynia: Secondary | ICD-10-CM | POA: Diagnosis not present

## 2018-11-03 DIAGNOSIS — R918 Other nonspecific abnormal finding of lung field: Secondary | ICD-10-CM | POA: Diagnosis not present

## 2018-11-03 DIAGNOSIS — R079 Chest pain, unspecified: Secondary | ICD-10-CM | POA: Diagnosis not present

## 2018-11-03 DIAGNOSIS — Z9889 Other specified postprocedural states: Secondary | ICD-10-CM | POA: Diagnosis not present

## 2018-11-03 DIAGNOSIS — Z87891 Personal history of nicotine dependence: Secondary | ICD-10-CM | POA: Diagnosis not present

## 2018-11-03 DIAGNOSIS — M546 Pain in thoracic spine: Secondary | ICD-10-CM | POA: Diagnosis not present

## 2018-11-03 DIAGNOSIS — M549 Dorsalgia, unspecified: Secondary | ICD-10-CM | POA: Diagnosis not present

## 2018-11-03 DIAGNOSIS — Z9981 Dependence on supplemental oxygen: Secondary | ICD-10-CM | POA: Diagnosis not present

## 2018-11-03 DIAGNOSIS — R000 Tachycardia, unspecified: Secondary | ICD-10-CM | POA: Diagnosis not present

## 2018-11-04 DIAGNOSIS — M5489 Other dorsalgia: Secondary | ICD-10-CM | POA: Diagnosis not present

## 2018-11-04 DIAGNOSIS — K5900 Constipation, unspecified: Secondary | ICD-10-CM | POA: Diagnosis not present

## 2018-11-04 DIAGNOSIS — I10 Essential (primary) hypertension: Secondary | ICD-10-CM | POA: Diagnosis not present

## 2018-11-04 DIAGNOSIS — J449 Chronic obstructive pulmonary disease, unspecified: Secondary | ICD-10-CM | POA: Diagnosis not present

## 2018-11-04 DIAGNOSIS — E119 Type 2 diabetes mellitus without complications: Secondary | ICD-10-CM | POA: Diagnosis not present

## 2018-11-05 DIAGNOSIS — R0602 Shortness of breath: Secondary | ICD-10-CM | POA: Diagnosis not present

## 2018-11-05 DIAGNOSIS — J441 Chronic obstructive pulmonary disease with (acute) exacerbation: Secondary | ICD-10-CM | POA: Diagnosis not present

## 2018-11-05 DIAGNOSIS — J9601 Acute respiratory failure with hypoxia: Secondary | ICD-10-CM | POA: Diagnosis not present

## 2018-11-05 DIAGNOSIS — J9621 Acute and chronic respiratory failure with hypoxia: Secondary | ICD-10-CM | POA: Diagnosis not present

## 2018-11-05 DIAGNOSIS — J449 Chronic obstructive pulmonary disease, unspecified: Secondary | ICD-10-CM | POA: Diagnosis not present

## 2018-11-06 DIAGNOSIS — L89152 Pressure ulcer of sacral region, stage 2: Secondary | ICD-10-CM | POA: Diagnosis not present

## 2018-11-10 DIAGNOSIS — G8918 Other acute postprocedural pain: Secondary | ICD-10-CM | POA: Diagnosis not present

## 2018-11-10 DIAGNOSIS — M79604 Pain in right leg: Secondary | ICD-10-CM | POA: Diagnosis not present

## 2018-11-10 DIAGNOSIS — M546 Pain in thoracic spine: Secondary | ICD-10-CM | POA: Diagnosis not present

## 2018-11-10 DIAGNOSIS — M79605 Pain in left leg: Secondary | ICD-10-CM | POA: Diagnosis not present

## 2018-11-11 DIAGNOSIS — K219 Gastro-esophageal reflux disease without esophagitis: Secondary | ICD-10-CM | POA: Diagnosis not present

## 2018-11-11 DIAGNOSIS — Z9981 Dependence on supplemental oxygen: Secondary | ICD-10-CM | POA: Diagnosis not present

## 2018-11-11 DIAGNOSIS — W19XXXD Unspecified fall, subsequent encounter: Secondary | ICD-10-CM | POA: Diagnosis not present

## 2018-11-11 DIAGNOSIS — S22050D Wedge compression fracture of T5-T6 vertebra, subsequent encounter for fracture with routine healing: Secondary | ICD-10-CM | POA: Diagnosis not present

## 2018-11-11 DIAGNOSIS — I2510 Atherosclerotic heart disease of native coronary artery without angina pectoris: Secondary | ICD-10-CM | POA: Diagnosis not present

## 2018-11-11 DIAGNOSIS — I5032 Chronic diastolic (congestive) heart failure: Secondary | ICD-10-CM | POA: Diagnosis not present

## 2018-11-11 DIAGNOSIS — E785 Hyperlipidemia, unspecified: Secondary | ICD-10-CM | POA: Diagnosis not present

## 2018-11-11 DIAGNOSIS — I110 Hypertensive heart disease with heart failure: Secondary | ICD-10-CM | POA: Diagnosis not present

## 2018-11-11 DIAGNOSIS — J9611 Chronic respiratory failure with hypoxia: Secondary | ICD-10-CM | POA: Diagnosis not present

## 2018-11-11 DIAGNOSIS — J9612 Chronic respiratory failure with hypercapnia: Secondary | ICD-10-CM | POA: Diagnosis not present

## 2018-11-11 DIAGNOSIS — Z7952 Long term (current) use of systemic steroids: Secondary | ICD-10-CM | POA: Diagnosis not present

## 2018-11-11 DIAGNOSIS — R1314 Dysphagia, pharyngoesophageal phase: Secondary | ICD-10-CM | POA: Diagnosis not present

## 2018-11-11 DIAGNOSIS — Z79891 Long term (current) use of opiate analgesic: Secondary | ICD-10-CM | POA: Diagnosis not present

## 2018-11-11 DIAGNOSIS — I252 Old myocardial infarction: Secondary | ICD-10-CM | POA: Diagnosis not present

## 2018-11-11 DIAGNOSIS — J441 Chronic obstructive pulmonary disease with (acute) exacerbation: Secondary | ICD-10-CM | POA: Diagnosis not present

## 2018-11-12 DIAGNOSIS — I110 Hypertensive heart disease with heart failure: Secondary | ICD-10-CM | POA: Diagnosis not present

## 2018-11-12 DIAGNOSIS — I252 Old myocardial infarction: Secondary | ICD-10-CM | POA: Diagnosis not present

## 2018-11-12 DIAGNOSIS — Z9981 Dependence on supplemental oxygen: Secondary | ICD-10-CM | POA: Diagnosis not present

## 2018-11-12 DIAGNOSIS — J9611 Chronic respiratory failure with hypoxia: Secondary | ICD-10-CM | POA: Diagnosis not present

## 2018-11-12 DIAGNOSIS — I5032 Chronic diastolic (congestive) heart failure: Secondary | ICD-10-CM | POA: Diagnosis not present

## 2018-11-12 DIAGNOSIS — J9612 Chronic respiratory failure with hypercapnia: Secondary | ICD-10-CM | POA: Diagnosis not present

## 2018-11-12 DIAGNOSIS — I2510 Atherosclerotic heart disease of native coronary artery without angina pectoris: Secondary | ICD-10-CM | POA: Diagnosis not present

## 2018-11-12 DIAGNOSIS — R1314 Dysphagia, pharyngoesophageal phase: Secondary | ICD-10-CM | POA: Diagnosis not present

## 2018-11-12 DIAGNOSIS — Z7952 Long term (current) use of systemic steroids: Secondary | ICD-10-CM | POA: Diagnosis not present

## 2018-11-12 DIAGNOSIS — W19XXXD Unspecified fall, subsequent encounter: Secondary | ICD-10-CM | POA: Diagnosis not present

## 2018-11-12 DIAGNOSIS — S22050D Wedge compression fracture of T5-T6 vertebra, subsequent encounter for fracture with routine healing: Secondary | ICD-10-CM | POA: Diagnosis not present

## 2018-11-12 DIAGNOSIS — K219 Gastro-esophageal reflux disease without esophagitis: Secondary | ICD-10-CM | POA: Diagnosis not present

## 2018-11-12 DIAGNOSIS — J441 Chronic obstructive pulmonary disease with (acute) exacerbation: Secondary | ICD-10-CM | POA: Diagnosis not present

## 2018-11-12 DIAGNOSIS — Z79891 Long term (current) use of opiate analgesic: Secondary | ICD-10-CM | POA: Diagnosis not present

## 2018-11-12 DIAGNOSIS — E785 Hyperlipidemia, unspecified: Secondary | ICD-10-CM | POA: Diagnosis not present

## 2018-11-16 ENCOUNTER — Emergency Department: Payer: Medicare Other

## 2018-11-16 ENCOUNTER — Other Ambulatory Visit: Payer: Self-pay

## 2018-11-16 ENCOUNTER — Inpatient Hospital Stay
Admission: EM | Admit: 2018-11-16 | Discharge: 2018-11-20 | DRG: 481 | Disposition: A | Discharge status: Home Health | Payer: Medicare Other | Attending: Internal Medicine | Admitting: Internal Medicine

## 2018-11-16 DIAGNOSIS — Z87891 Personal history of nicotine dependence: Secondary | ICD-10-CM | POA: Diagnosis not present

## 2018-11-16 DIAGNOSIS — Y92018 Other place in single-family (private) house as the place of occurrence of the external cause: Secondary | ICD-10-CM | POA: Diagnosis not present

## 2018-11-16 DIAGNOSIS — E7800 Pure hypercholesterolemia, unspecified: Secondary | ICD-10-CM | POA: Diagnosis present

## 2018-11-16 DIAGNOSIS — R0602 Shortness of breath: Secondary | ICD-10-CM | POA: Diagnosis not present

## 2018-11-16 DIAGNOSIS — J9601 Acute respiratory failure with hypoxia: Secondary | ICD-10-CM | POA: Diagnosis not present

## 2018-11-16 DIAGNOSIS — I509 Heart failure, unspecified: Secondary | ICD-10-CM | POA: Diagnosis not present

## 2018-11-16 DIAGNOSIS — Z79899 Other long term (current) drug therapy: Secondary | ICD-10-CM

## 2018-11-16 DIAGNOSIS — Z981 Arthrodesis status: Secondary | ICD-10-CM

## 2018-11-16 DIAGNOSIS — Z90710 Acquired absence of both cervix and uterus: Secondary | ICD-10-CM | POA: Diagnosis not present

## 2018-11-16 DIAGNOSIS — I10 Essential (primary) hypertension: Secondary | ICD-10-CM | POA: Diagnosis not present

## 2018-11-16 DIAGNOSIS — Z9981 Dependence on supplemental oxygen: Secondary | ICD-10-CM

## 2018-11-16 DIAGNOSIS — M84359A Stress fracture, hip, unspecified, initial encounter for fracture: Secondary | ICD-10-CM | POA: Diagnosis not present

## 2018-11-16 DIAGNOSIS — S72041A Displaced fracture of base of neck of right femur, initial encounter for closed fracture: Secondary | ICD-10-CM | POA: Diagnosis not present

## 2018-11-16 DIAGNOSIS — I252 Old myocardial infarction: Secondary | ICD-10-CM | POA: Diagnosis not present

## 2018-11-16 DIAGNOSIS — Z7952 Long term (current) use of systemic steroids: Secondary | ICD-10-CM

## 2018-11-16 DIAGNOSIS — T380X5A Adverse effect of glucocorticoids and synthetic analogues, initial encounter: Secondary | ICD-10-CM | POA: Diagnosis present

## 2018-11-16 DIAGNOSIS — F410 Panic disorder [episodic paroxysmal anxiety] without agoraphobia: Secondary | ICD-10-CM | POA: Diagnosis present

## 2018-11-16 DIAGNOSIS — I110 Hypertensive heart disease with heart failure: Secondary | ICD-10-CM | POA: Diagnosis present

## 2018-11-16 DIAGNOSIS — S72001A Fracture of unspecified part of neck of right femur, initial encounter for closed fracture: Secondary | ICD-10-CM

## 2018-11-16 DIAGNOSIS — R069 Unspecified abnormalities of breathing: Secondary | ICD-10-CM | POA: Diagnosis not present

## 2018-11-16 DIAGNOSIS — Z79891 Long term (current) use of opiate analgesic: Secondary | ICD-10-CM | POA: Diagnosis not present

## 2018-11-16 DIAGNOSIS — J441 Chronic obstructive pulmonary disease with (acute) exacerbation: Secondary | ICD-10-CM | POA: Diagnosis present

## 2018-11-16 DIAGNOSIS — S72041D Displaced fracture of base of neck of right femur, subsequent encounter for closed fracture with routine healing: Secondary | ICD-10-CM | POA: Diagnosis not present

## 2018-11-16 DIAGNOSIS — K219 Gastro-esophageal reflux disease without esophagitis: Secondary | ICD-10-CM | POA: Diagnosis present

## 2018-11-16 DIAGNOSIS — I2510 Atherosclerotic heart disease of native coronary artery without angina pectoris: Secondary | ICD-10-CM | POA: Diagnosis present

## 2018-11-16 DIAGNOSIS — E876 Hypokalemia: Secondary | ICD-10-CM | POA: Diagnosis present

## 2018-11-16 DIAGNOSIS — S72009A Fracture of unspecified part of neck of unspecified femur, initial encounter for closed fracture: Secondary | ICD-10-CM | POA: Diagnosis present

## 2018-11-16 DIAGNOSIS — J209 Acute bronchitis, unspecified: Secondary | ICD-10-CM | POA: Diagnosis present

## 2018-11-16 DIAGNOSIS — J440 Chronic obstructive pulmonary disease with acute lower respiratory infection: Secondary | ICD-10-CM | POA: Diagnosis present

## 2018-11-16 DIAGNOSIS — J439 Emphysema, unspecified: Secondary | ICD-10-CM | POA: Diagnosis not present

## 2018-11-16 DIAGNOSIS — R062 Wheezing: Secondary | ICD-10-CM | POA: Diagnosis not present

## 2018-11-16 DIAGNOSIS — E785 Hyperlipidemia, unspecified: Secondary | ICD-10-CM | POA: Diagnosis present

## 2018-11-16 DIAGNOSIS — I5032 Chronic diastolic (congestive) heart failure: Secondary | ICD-10-CM | POA: Diagnosis present

## 2018-11-16 DIAGNOSIS — J9611 Chronic respiratory failure with hypoxia: Secondary | ICD-10-CM | POA: Diagnosis present

## 2018-11-16 DIAGNOSIS — Z419 Encounter for procedure for purposes other than remedying health state, unspecified: Secondary | ICD-10-CM

## 2018-11-16 DIAGNOSIS — X501XXA Overexertion from prolonged static or awkward postures, initial encounter: Secondary | ICD-10-CM

## 2018-11-16 DIAGNOSIS — J449 Chronic obstructive pulmonary disease, unspecified: Secondary | ICD-10-CM | POA: Diagnosis not present

## 2018-11-16 DIAGNOSIS — M7989 Other specified soft tissue disorders: Secondary | ICD-10-CM | POA: Diagnosis not present

## 2018-11-16 DIAGNOSIS — M25551 Pain in right hip: Secondary | ICD-10-CM | POA: Diagnosis not present

## 2018-11-16 LAB — BASIC METABOLIC PANEL
Anion gap: 9 (ref 5–15)
BUN: 17 mg/dL (ref 6–20)
CALCIUM: 8.8 mg/dL — AB (ref 8.9–10.3)
CO2: 32 mmol/L (ref 22–32)
Chloride: 103 mmol/L (ref 98–111)
Creatinine, Ser: 0.43 mg/dL — ABNORMAL LOW (ref 0.44–1.00)
GFR calc Af Amer: >60 mL/min (ref >60)
GFR calc non Af Amer: >60 mL/min (ref >60)
Glucose, Bld: 91 mg/dL (ref 70–99)
Potassium: 3.2 mmol/L — ABNORMAL LOW (ref 3.5–5.1)
Sodium: 144 mmol/L (ref 135–145)

## 2018-11-16 LAB — CBC WITH DIFFERENTIAL/PLATELET
Abs Immature Granulocytes: 0.29 10*3/uL — ABNORMAL HIGH (ref 0.00–0.07)
BASOS ABS: 0.1 10*3/uL (ref 0.0–0.1)
Basophils Relative: 1 %
Eosinophils Absolute: 0.1 10*3/uL (ref 0.0–0.5)
Eosinophils Relative: 1 %
HCT: 45.6 % (ref 36.0–46.0)
Hemoglobin: 13.5 g/dL (ref 12.0–15.0)
Immature Granulocytes: 2 %
Lymphocytes Relative: 10 %
Lymphs Abs: 1.3 10*3/uL (ref 0.7–4.0)
MCH: 29.5 pg (ref 26.0–34.0)
MCHC: 29.6 g/dL — ABNORMAL LOW (ref 30.0–36.0)
MCV: 99.6 fL (ref 80.0–100.0)
Monocytes Absolute: 1.4 10*3/uL — ABNORMAL HIGH (ref 0.1–1.0)
Monocytes Relative: 11 %
NRBC: 0 % (ref 0.0–0.2)
Neutro Abs: 10 10*3/uL — ABNORMAL HIGH (ref 1.7–7.7)
Neutrophils Relative %: 75 %
Platelets: 296 10*3/uL (ref 150–400)
RBC: 4.58 MIL/uL (ref 3.87–5.11)
RDW: 17.1 % — ABNORMAL HIGH (ref 11.5–15.5)
WBC: 13.2 10*3/uL — ABNORMAL HIGH (ref 4.0–10.5)

## 2018-11-16 LAB — TYPE AND SCREEN
ABO/RH(D): O POS
ANTIBODY SCREEN: NEGATIVE

## 2018-11-16 LAB — TROPONIN I: Troponin I: <0.03 ng/mL (ref <0.03)

## 2018-11-16 LAB — PROTIME-INR
INR: 1 (ref 0.8–1.2)
Prothrombin Time: 12.8 seconds (ref 11.4–15.2)

## 2018-11-16 LAB — APTT: aPTT: 27 seconds (ref 24–36)

## 2018-11-16 MED ORDER — ACETAMINOPHEN 650 MG RE SUPP: 650.0000 mg | Freq: Four times a day (QID) | RECTAL | Status: DC | PRN

## 2018-11-16 MED ORDER — METOPROLOL TARTRATE 25 MG PO TABS
25.0000 mg | ORAL_TABLET | Freq: Two times a day (BID) | ORAL | Status: DC
  Administered 2018-11-17 – 2018-11-19 (×7): 25 mg via ORAL
  Filled 2018-11-16 (×8): qty 1

## 2018-11-16 MED ORDER — VITAMIN C 500 MG PO TABS
1000.0000 mg | ORAL_TABLET | Freq: Every day | ORAL | Status: DC
  Administered 2018-11-18 – 2018-11-20 (×3): 1000 mg via ORAL
  Filled 2018-11-16 (×3): qty 2

## 2018-11-16 MED ORDER — NALOXONE HCL 4 MG/0.1ML NA LIQD: 1.0000 | Freq: Once | NASAL | Status: DC

## 2018-11-16 MED ORDER — OXYCODONE HCL 5 MG PO TABS
5.0000 mg | ORAL_TABLET | Freq: Once | ORAL | Status: AC
  Administered 2018-11-16: 5 mg via ORAL
  Filled 2018-11-16: qty 1

## 2018-11-16 MED ORDER — MORPHINE SULFATE ER 15 MG PO TBCR
15.0000 mg | EXTENDED_RELEASE_TABLET | Freq: Two times a day (BID) | ORAL | Status: DC
  Administered 2018-11-17 – 2018-11-20 (×8): 15 mg via ORAL
  Filled 2018-11-16 (×8): qty 1

## 2018-11-16 MED ORDER — PREDNISONE 20 MG PO TABS: 60.0000 mg | ORAL_TABLET | Freq: Every day | ORAL | 0 refills | Status: DC

## 2018-11-16 MED ORDER — POLYETHYLENE GLYCOL 3350 17 G PO PACK
17.0000 g | PACK | Freq: Every day | ORAL | Status: DC
  Administered 2018-11-18 – 2018-11-20 (×3): 17 g via ORAL
  Filled 2018-11-16 (×3): qty 1

## 2018-11-16 MED ORDER — METHYLPREDNISOLONE SODIUM SUCC 40 MG IJ SOLR
40.0000 mg | Freq: Three times a day (TID) | INTRAMUSCULAR | Status: DC
  Administered 2018-11-16 – 2018-11-17 (×2): 40 mg via INTRAVENOUS
  Filled 2018-11-16 (×2): qty 1

## 2018-11-16 MED ORDER — IPRATROPIUM-ALBUTEROL 0.5-2.5 (3) MG/3ML IN SOLN
3.0000 mL | Freq: Once | RESPIRATORY_TRACT | Status: AC
  Administered 2018-11-16: 3 mL via RESPIRATORY_TRACT
  Filled 2018-11-16: qty 3

## 2018-11-16 MED ORDER — OXYCODONE HCL 5 MG PO TABS
5.0000 mg | ORAL_TABLET | Freq: Four times a day (QID) | ORAL | Status: DC
  Administered 2018-11-17 (×2): 5 mg via ORAL
  Filled 2018-11-16 (×2): qty 1

## 2018-11-16 MED ORDER — DOXYCYCLINE HYCLATE 100 MG PO TABS
100.0000 mg | ORAL_TABLET | Freq: Two times a day (BID) | ORAL | Status: DC
  Administered 2018-11-16 – 2018-11-20 (×7): 100 mg via ORAL
  Filled 2018-11-16 (×7): qty 1

## 2018-11-16 MED ORDER — DOXYCYCLINE HYCLATE 100 MG PO CAPS: 100.0000 mg | ORAL_CAPSULE | Freq: Two times a day (BID) | ORAL | 0 refills | Status: DC

## 2018-11-16 MED ORDER — LACTULOSE 10 GM/15ML PO SOLN
20.0000 g | Freq: Two times a day (BID) | ORAL | Status: DC | PRN
  Filled 2018-11-16: qty 30

## 2018-11-16 MED ORDER — FUROSEMIDE 20 MG PO TABS: 20.0000 mg | ORAL_TABLET | Freq: Every day | ORAL | Status: DC

## 2018-11-16 MED ORDER — ROSUVASTATIN CALCIUM 10 MG PO TABS
10.0000 mg | ORAL_TABLET | Freq: Every day | ORAL | Status: DC
  Administered 2018-11-17 – 2018-11-20 (×4): 10 mg via ORAL
  Filled 2018-11-16 (×4): qty 1

## 2018-11-16 MED ORDER — ONDANSETRON HCL 4 MG PO TABS: 4.0000 mg | ORAL_TABLET | Freq: Four times a day (QID) | ORAL | Status: DC | PRN

## 2018-11-16 MED ORDER — DILTIAZEM HCL ER COATED BEADS 120 MG PO CP24
120.0000 mg | ORAL_CAPSULE | Freq: Every day | ORAL | Status: DC
  Administered 2018-11-17 – 2018-11-20 (×4): 120 mg via ORAL
  Filled 2018-11-16 (×4): qty 1

## 2018-11-16 MED ORDER — OXYCODONE HCL 5 MG PO TABS
5.0000 mg | ORAL_TABLET | ORAL | Status: DC | PRN
  Administered 2018-11-16: 5 mg via ORAL
  Filled 2018-11-16: qty 1

## 2018-11-16 MED ORDER — ALPRAZOLAM 0.5 MG PO TABS
0.5000 mg | ORAL_TABLET | Freq: Three times a day (TID) | ORAL | Status: DC
  Administered 2018-11-17 – 2018-11-20 (×9): 0.5 mg via ORAL
  Filled 2018-11-16 (×9): qty 1

## 2018-11-16 MED ORDER — PREGABALIN 50 MG PO CAPS
100.0000 mg | ORAL_CAPSULE | Freq: Two times a day (BID) | ORAL | Status: DC
  Administered 2018-11-17 – 2018-11-20 (×7): 100 mg via ORAL
  Filled 2018-11-16 (×7): qty 2

## 2018-11-16 MED ORDER — DONEPEZIL HCL 5 MG PO TABS
10.0000 mg | ORAL_TABLET | Freq: Every day | ORAL | Status: DC
  Administered 2018-11-18 – 2018-11-20 (×3): 10 mg via ORAL
  Filled 2018-11-16 (×3): qty 2

## 2018-11-16 MED ORDER — ONDANSETRON HCL 4 MG/2ML IJ SOLN
4.0000 mg | Freq: Four times a day (QID) | INTRAMUSCULAR | Status: DC | PRN
  Administered 2018-11-17: 4 mg via INTRAVENOUS

## 2018-11-16 MED ORDER — TAMSULOSIN HCL 0.4 MG PO CAPS: 0.4000 mg | ORAL_CAPSULE | Freq: Every day | ORAL | Status: DC

## 2018-11-16 MED ORDER — SENNOSIDES-DOCUSATE SODIUM 8.6-50 MG PO TABS: 2.0000 | ORAL_TABLET | Freq: Two times a day (BID) | ORAL | Status: DC

## 2018-11-16 MED ORDER — BISACODYL 10 MG RE SUPP
10.0000 mg | Freq: Every day | RECTAL | Status: DC | PRN
  Filled 2018-11-16: qty 1

## 2018-11-16 MED ORDER — DOCUSATE SODIUM 100 MG PO CAPS: 100.0000 mg | ORAL_CAPSULE | Freq: Two times a day (BID) | ORAL | Status: DC

## 2018-11-16 MED ORDER — GUAIFENESIN 100 MG/5ML PO SOLN
5.0000 mL | ORAL | Status: DC | PRN
  Filled 2018-11-16: qty 5

## 2018-11-16 MED ORDER — POLYETHYLENE GLYCOL 3350 17 G PO PACK: 17.0000 g | PACK | Freq: Every day | ORAL | Status: DC | PRN

## 2018-11-16 MED ORDER — METHOCARBAMOL 500 MG PO TABS
750.0000 mg | ORAL_TABLET | Freq: Three times a day (TID) | ORAL | Status: DC | PRN
  Administered 2018-11-18 – 2018-11-19 (×2): 750 mg via ORAL
  Filled 2018-11-16 (×2): qty 2

## 2018-11-16 MED ORDER — ACETAMINOPHEN 325 MG PO TABS: 650.0000 mg | ORAL_TABLET | Freq: Four times a day (QID) | ORAL | Status: DC | PRN

## 2018-11-16 MED ORDER — DOXYCYCLINE HYCLATE 100 MG PO TABS
100.0000 mg | ORAL_TABLET | Freq: Once | ORAL | Status: AC
  Administered 2018-11-16: 100 mg via ORAL
  Filled 2018-11-16: qty 1

## 2018-11-16 MED ORDER — IPRATROPIUM-ALBUTEROL 0.5-2.5 (3) MG/3ML IN SOLN
3.0000 mL | RESPIRATORY_TRACT | Status: DC
  Administered 2018-11-16 – 2018-11-20 (×22): 3 mL via RESPIRATORY_TRACT
  Filled 2018-11-16 (×22): qty 3

## 2018-11-16 MED ORDER — ACETAMINOPHEN 500 MG PO TABS
1000.0000 mg | ORAL_TABLET | Freq: Once | ORAL | Status: AC
  Administered 2018-11-16: 1000 mg via ORAL
  Filled 2018-11-16: qty 2

## 2018-11-16 MED ORDER — METHYLPREDNISOLONE SODIUM SUCC 125 MG IJ SOLR
125.0000 mg | Freq: Once | INTRAMUSCULAR | Status: AC
  Administered 2018-11-16: 125 mg via INTRAVENOUS
  Filled 2018-11-16: qty 2

## 2018-11-16 MED ORDER — PANTOPRAZOLE SODIUM 40 MG PO TBEC
40.0000 mg | DELAYED_RELEASE_TABLET | Freq: Two times a day (BID) | ORAL | Status: DC
  Administered 2018-11-17 – 2018-11-20 (×7): 40 mg via ORAL
  Filled 2018-11-16 (×7): qty 1

## 2018-11-16 NOTE — ED Provider Notes (Signed)
Patient was signed out to me by Dr. Nita Sickle.  She is a 53 year old female who came in for shortness of breath.  She did also note that she was having right hip pain and her CT is positive for right femoral neck fracture.  I have reevaluated the patient, who states that while she did not fall, several days ago she twisted in her living room and had an acute onset of severe pain and has been unable to ambulate since then.  I have called the orthopedist, and will plan to admit the patient to the hospitalist at this time.   Rockne Menghini, MD 11/16/18 1759

## 2018-11-16 NOTE — ED Notes (Signed)
ED Provider at bedside. 

## 2018-11-16 NOTE — ED Notes (Signed)
[facility] in with pt for admission.   

## 2018-11-16 NOTE — H&P (Signed)
[employer] [facility] [place] at [facility]   PATIENT NAME: Caitlyn Evans    MR#:  [id number]  DATE OF BIRTH:  08/09/1966  DATE OF ADMISSION:  11/16/2018  PRIMARY CARE PHYSICIAN: Corky Downs, MD   REQUESTING/REFERRING PHYSICIAN: dr Sharma Covert  CHIEF COMPLAINT:   SOB hip pain HISTORY OF PRESENT ILLNESS:  Caitlyn Evans  is a 53 y.o. female with a known history of chronic hypoxic respiratory failure on 2-1/2 to 3 L of oxygen at home due to COPD, chronic diastolic heart failure with preserved ejection fraction and hyperlipidemia who presented to the emergency room due to shortness of breath and hip pain. Patient reports no fever, chills, body aches or wheezing.  She actually says her shortness of breath is improved however she continues to have hip pain.  She has had hip pain for the past 3 days.  She denies fall.  She had recent spinal fusion in February at [facility] and since that time she has been ambulating however over the past 3 days she is been having much difficulty ambulating and she could not bear weight on her right hip for the past 3 days.  Chest x-ray here shows no acute infiltrate.  CT of the right hip shows hip fracture.  Dr. Allena Katz has been contacted by ED physician.  Plan for the patient to go to the OR tomorrow for right hip percutaneous pinning. PAST MEDICAL HISTORY:   Past Medical History:  Diagnosis Date  . Anxiety    panic attacks  . Asthma   . BOOP (bronchiolitis obliterans with organizing pneumonia) (HCC)   . CHF (congestive heart failure) (HCC)   . Dysphagia, pharyngoesophageal phase 05/30/2015  . Emphysema lung (HCC) 05/30/2015   on steroids and nebulizer for copd issues.  09/15/18  . GERD (gastroesophageal reflux disease)   . Hypercholesterolemia   . Hypertension   . Lung mass   . Migraines   . Motion sickness    all moving vehicles  . Myocardial infarction (HCC) 2012  . Respiratory failure with hypercapnia (HCC) 08/2018   was hospitalized and on  bipap. steroids and nebulizer treatments helped  . Seasonal allergies   . Shortness of breath dyspnea    1 flight-stairs  . Sternal fracture     PAST SURGICAL HISTORY:   Past Surgical History:  Procedure Laterality Date  . BACK SURGERY    . COLONOSCOPY WITH PROPOFOL N/A 06/02/2015   Procedure: COLONOSCOPY WITH PROPOFOL;  Surgeon: Midge Minium, MD;  Location: [facility];  Service: Endoscopy;  Laterality: N/A;  . ESOPHAGOGASTRODUODENOSCOPY (EGD) WITH PROPOFOL N/A 06/02/2015   Procedure: ESOPHAGOGASTRODUODENOSCOPY (EGD) WITH PROPOFOL withdialation;  Surgeon: Midge Minium, MD;  Location: [facility];  Service: Endoscopy;  Laterality: N/A;  . ESOPHAGOGASTRODUODENOSCOPY (EGD) WITH PROPOFOL N/A 11/05/2017   Procedure: ESOPHAGOGASTRODUODENOSCOPY (EGD) WITH PROPOFOL;  Surgeon: Pasty Spillers, MD;  Location: [facility] ENDOSCOPY;  Service: Endoscopy;  Laterality: N/A;  . KYPHOPLASTY N/A 09/17/2018   Procedure: KYPHOPLASTY T6;  Surgeon: Kennedy Bucker, MD;  Location: [facility] ORS;  Service: Orthopedics;  Laterality: N/A;  . LUNG SURGERY Left    Upper lobe removed  . OOPHORECTOMY Left   . VAGINAL HYSTERECTOMY      SOCIAL HISTORY:   Social History   Tobacco Use  . Smoking status: Former Smoker    Packs/day: 1.00    Years: 30.00    Pack years: 30.00    Types: Cigarettes  . Smokeless tobacco: Never Used  . Tobacco comment: quit some time in  2012  Substance Use Topics  . Alcohol use: No    Alcohol/week: 0.0 standard drinks    Comment: rare consumption    FAMILY HISTORY:   Family History  Problem Relation Age of Onset  . COPD Mother   . Heart attack Mother   . Stroke Father   . COPD Father     DRUG ALLERGIES:   Allergies  Allergen Reactions  . Codeine Hives, Nausea And Vomiting and Nausea Only  . Penicillins Hives and Other (See Comments)    Has patient had a PCN reaction causing immediate rash, facial/tongue/throat swelling, SOB or lightheadedness with hypotension:  No Has patient had a PCN reaction causing severe rash involving mucus membranes or skin necrosis: No Has patient had a PCN reaction that required hospitalization No Has patient had a PCN reaction occurring within the last 10 years: No If all of the above answers are "NO", then may proceed with Cephalosporin use. Other reaction(s): Other (See Comments) Has patient had a PCN    REVIEW OF SYSTEMS:   Review of Systems  Constitutional: Negative.  Negative for chills, fever and malaise/fatigue.  HENT: Negative.  Negative for ear discharge, ear pain, hearing loss, nosebleeds and sore throat.   Eyes: Negative.  Negative for blurred vision and pain.  Respiratory: Positive for shortness of breath. Negative for cough, hemoptysis and wheezing.   Cardiovascular: Negative.  Negative for chest pain, palpitations and leg swelling.  Gastrointestinal: Negative.  Negative for abdominal pain, blood in stool, diarrhea, nausea and vomiting.  Genitourinary: Negative.  Negative for dysuria.  Musculoskeletal: Positive for joint pain. Negative for back pain.  Skin: Negative.   Neurological: Negative for dizziness, tremors, speech change, focal weakness, seizures and headaches.  Endo/Heme/Allergies: Negative.  Does not bruise/bleed easily.  Psychiatric/Behavioral: Negative.  Negative for depression, hallucinations and suicidal ideas.    MEDICATIONS AT HOME:   Prior to Admission medications   Medication Sig Start Date End Date Taking? Authorizing Provider  acetaminophen (TYLENOL) 650 MG CR tablet Take 650 mg by mouth every 4 (four) hours as needed for pain.   Yes [provider]  ALPRAZolam Prudy Feeler) 0.5 MG tablet Take 0.5 mg by mouth 3 (three) times daily. 06/10/18  Yes [provider]  Ascorbic Acid (VITAMIN C) 1000 MG tablet Take 1,000 mg by mouth daily.   Yes [provider]  diltiazem (CARDIZEM LA) 120 MG 24 hr tablet Take 120 mg by mouth daily. 11/26/17  Yes [provider]   docusate sodium (COLACE) 100 MG capsule Take 1 capsule (100 mg total) by mouth 2 (two) times daily. 10/05/18  Yes Enid Baas, MD  donepezil (ARICEPT) 10 MG tablet Take 10 mg by mouth daily.    Yes [provider]  ipratropium-albuterol (DUONEB) 0.5-2.5 (3) MG/3ML SOLN Inhale 3 mLs into the lungs every 4 (four) hours as needed (for wheezing/shortness of breath). Patient taking differently: Inhale 3 mLs into the lungs every 4 (four) hours as needed. For wheezing/shortness of breath 05/13/16  Yes Enid Baas, MD  lactulose (CHRONULAC) 10 GM/15ML solution Take 30 mLs (20 g total) by mouth 2 (two) times daily as needed for mild constipation. 10/05/18  Yes Enid Baas, MD  lidocaine (LIDODERM) 5 % Place 1 patch onto the skin daily. Remove & Discard patch within 12 hours or as directed by MD 10/05/18  Yes Enid Baas, MD  methocarbamol (ROBAXIN) 750 MG tablet Take 750 mg by mouth every 8 (eight) hours as needed for muscle spasms.   Yes  [provider]  metoprolol tartrate (LOPRESSOR) 25 MG tablet Take 1 tablet (25 mg total) by mouth 2 (two) times daily. 11/25/17  Yes Enid Baas, MD  morphine (MS CONTIN) 15 MG 12 hr tablet Take 15 mg by mouth every 12 (twelve) hours.   Yes [provider]  naloxone (NARCAN) nasal spray 4 mg/0.1 mL Place 1 spray into the nose once. 11/10/18  Yes [provider]  OxyCODONE HCl, Abuse Deter, (OXAYDO) 5 MG TABA Take 5 mg by mouth 4 (four) times daily. 11/10/18 11/24/18 Yes [provider]  pantoprazole (PROTONIX) 40 MG tablet TAKE ONE TABLET BY MOUTH TWICE DAILY Patient taking differently: Take 40 mg by mouth 2 (two) times daily.  04/03/18  Yes Melodie Bouillon B, MD  polyethylene glycol (MIRALAX / GLYCOLAX) packet Take 17 g by mouth daily. 10/05/18  Yes Enid Baas, MD  predniSONE (DELTASONE) 10 MG tablet Take 10 mg by mouth 2 (two) times daily with a meal.   Yes [provider]   pregabalin (LYRICA) 100 MG capsule Take 100 mg by mouth 2 (two) times daily. 11/10/18 11/10/19 Yes [provider]  rosuvastatin (CRESTOR) 10 MG tablet Take 10 mg by mouth daily.  10/10/17  Yes [provider]  senna-docusate (SENOKOT-S) 8.6-50 MG tablet Take 2 tablets by mouth 2 (two) times daily. 10/16/18 10/16/19 Yes [provider]  doxycycline (VIBRAMYCIN) 100 MG capsule Take 1 capsule (100 mg total) by mouth 2 (two) times daily for 7 days. 11/16/18 11/23/18  Nita Sickle, MD  furosemide (LASIX) 20 MG tablet Take 1 tablet (20 mg total) by mouth daily. Patient not taking: Reported on 11/16/2018 11/26/17   Enid Baas, MD  guaiFENesin (ROBITUSSIN) 100 MG/5ML SOLN Take 5 mLs (100 mg total) by mouth every 4 (four) hours as needed for cough or to loosen phlegm. 09/26/18   Shaune Pollack, MD  predniSONE (DELTASONE) 20 MG tablet Take 3 tablets (60 mg total) by mouth daily for 4 days. 11/16/18 11/20/18  Nita Sickle, MD  tamsulosin (FLOMAX) 0.4 MG CAPS capsule Take 1 capsule (0.4 mg total) by mouth daily. Patient not taking: Reported on 11/16/2018 11/26/17   Enid Baas, MD      VITAL SIGNS:  Blood pressure (!) 144/77, pulse 79, temperature 97.8 F (36.6 C), temperature source Oral, resp. rate 12, height 4\' 10"  (1.473 m), weight 59 kg, SpO2 97 %.  PHYSICAL EXAMINATION:   Physical Exam Constitutional:      General: She is not in acute distress. HENT:     Head: Normocephalic.  Eyes:     General: No scleral icterus. Neck:     Musculoskeletal: Normal range of motion and neck supple.     Vascular: No JVD.     Trachea: No tracheal deviation.  Cardiovascular:     Rate and Rhythm: Normal rate and regular rhythm.     Heart sounds: Normal heart sounds. No murmur. No friction rub. No gallop.   Pulmonary:     Effort: Pulmonary effort is normal. No respiratory distress.     Breath sounds: Rhonchi present. No wheezing or rales.  Chest:     Chest wall: No tenderness.   Abdominal:     General: Bowel sounds are normal. There is no distension.     Palpations: Abdomen is soft. There is no mass.     Tenderness: There is no abdominal tenderness. There is no guarding or rebound.  Musculoskeletal:     Comments: Right leg shorter than left  Skin:  General: Skin is warm.     Findings: No erythema or rash.  Neurological:     Mental Status: She is alert and oriented to person, place, and time.  Psychiatric:        Mood and Affect: Mood normal. Mood is not anxious.        Behavior: Behavior normal. Behavior is not agitated.        Judgment: Judgment normal.       LABORATORY PANEL:   CBC Recent Labs  Lab 11/16/18 1320  WBC 13.2*  HGB 13.5  HCT 45.6  PLT 296   ------------------------------------------------------------------------------------------------------------------  Chemistries  Recent Labs  Lab 11/16/18 1320  NA 144  K 3.2*  CL 103  CO2 32  GLUCOSE 91  BUN 17  CREATININE 0.43*  CALCIUM 8.8*   ------------------------------------------------------------------------------------------------------------------  Cardiac Enzymes Recent Labs  Lab 11/16/18 1320  TROPONINI <0.03   ------------------------------------------------------------------------------------------------------------------  RADIOLOGY:  Ct Hip Right Wo Contrast  Result Date: 11/16/2018 CLINICAL DATA:  Severe right hip pain.  No known injury. EXAM: CT OF THE RIGHT HIP WITHOUT CONTRAST TECHNIQUE: Multidetector CT imaging of the right hip was performed according to the standard protocol. Multiplanar CT image reconstructions were also generated. COMPARISON:  Right hip x-rays from same day. FINDINGS: Bones/Joint/Cartilage Acute, minimally impacted fracture of the right femoral neck. No dislocation. The right hip joint space is preserved. No significant joint effusion. Ligaments Suboptimally assessed by CT. Muscles and Tendons Intact. Soft tissues Unremarkable.  IMPRESSION: 1. Acute, minimally impacted fracture of the right femoral neck. Electronically Signed   By: Obie Dredge M.D.   On: 11/16/2018 17:23   US Venous Img Lower Bilateral  Result Date: 11/16/2018 CLINICAL DATA:  Leg swelling.  History of back surgery. EXAM: BILATERAL LOWER EXTREMITY VENOUS DOPPLER ULTRASOUND TECHNIQUE: Gray-scale sonography with graded compression, as well as color Doppler and duplex ultrasound were performed to evaluate the lower extremity deep venous systems from the level of the common femoral vein and including the common femoral, femoral, profunda femoral, popliteal and calf veins including the posterior tibial, peroneal and gastrocnemius veins when visible. The superficial great saphenous vein was also interrogated. Spectral Doppler was utilized to evaluate flow at rest and with distal augmentation maneuvers in the common femoral, femoral and popliteal veins. COMPARISON:  None. FINDINGS: RIGHT LOWER EXTREMITY Common Femoral Vein: No evidence of thrombus. Normal compressibility, respiratory phasicity and response to augmentation. Saphenofemoral Junction: No evidence of thrombus. Normal compressibility and flow on color Doppler imaging. Profunda Femoral Vein: No evidence of thrombus. Normal compressibility and flow on color Doppler imaging. Femoral Vein: No evidence of thrombus. Normal compressibility, respiratory phasicity and response to augmentation. Popliteal Vein: No evidence of thrombus. Normal compressibility, respiratory phasicity and response to augmentation. Calf Veins: No evidence of thrombus. Normal compressibility and flow on color Doppler imaging. Other Findings:  None. LEFT LOWER EXTREMITY Common Femoral Vein: No evidence of thrombus. Normal compressibility, respiratory phasicity and response to augmentation. Saphenofemoral Junction: No evidence of thrombus. Normal compressibility and flow on color Doppler imaging. Profunda Femoral Vein: No evidence of thrombus.  Normal compressibility and flow on color Doppler imaging. Femoral Vein: No evidence of thrombus. Normal compressibility, respiratory phasicity and response to augmentation. Popliteal Vein: No evidence of thrombus. Normal compressibility, respiratory phasicity and response to augmentation. Calf Veins: No evidence of thrombus. Normal compressibility and flow on color Doppler imaging. Other Findings:  None. IMPRESSION: No evidence of deep venous thrombosis in the lower extremities. Electronically Signed   By: Madelaine Bhat  Lowella Dandy M.D.   On: 11/16/2018 15:15   Dg Chest Portable 1 View  Result Date: 11/16/2018 CLINICAL DATA:  Shortness of breath since yesterday. History of myocardial infarction, emphysema, LEFT lobectomy. EXAM: PORTABLE CHEST 1 VIEW COMPARISON:  Chest radiograph October 03, 2018 FINDINGS: Cardiomediastinal silhouette is normal. No pleural effusions or focal consolidations. Surgical clips LEFT upper lung zone. Biapical pleural thickening. Trachea projects midline and there is no pneumothorax. Soft tissue planes and included osseous structures are non-suspicious. New cervicothoracic spinal hardware. IMPRESSION: 1. No acute cardiopulmonary process. 2. Postsurgical changes LEFT upper lung zone. Electronically Signed   By: Awilda Metro M.D.   On: 11/16/2018 13:43   Dg Hip Unilat W Or Wo Pelvis 2-3 Views Right  Result Date: 11/16/2018 CLINICAL DATA:  Hip pain EXAM: DG HIP (WITH OR WITHOUT PELVIS) 2-3V RIGHT COMPARISON:  None. FINDINGS: There is cortical irregularity noted in the femoral neck region. Concern for possible nondisplaced femoral neck fracture. No subluxation or dislocation. IMPRESSION: Concern for subtle nondisplaced right femoral neck fracture. This could be confirmed with CT. Electronically Signed   By: Charlett Nose M.D.   On: 11/16/2018 16:00    EKG:  Normal sinus rhythm heart rate 80 no ST elevation or depression  IMPRESSION AND PLAN:   53 year old female with chronic hypoxic  respiratory failure due to COPD on 2-1/2 L of oxygen at home and chronic diastolic heart failure presented to the emergency room due to shortness of breath and hip pain.  1. nondisplaced right femoral neck fracture: Pathological likely due to chronic steroid use Patient is at moderate risk for moderate risk procedure may proceed without further cardiac intervention. Plan to go to the OR tomorrow N.p.o. after midnight DVT prophylaxis as per orthopedic surgery  2.  Acute COPD exacerbation: Start IV steroids (patient is on chronic steroids at home) Continue nebs and inhalers Doxycycline for acute bronchitis  3.  Essential hypertension: Continue diltiazem  4.  Chronic diastolic heart failure without signs of exacerbation Monitor intake and output with daily weight Continue Lasix 5.  Recent back surgery with pain: Continue morphine and oxycodone  6.  Hyperlipidemia: Continue statin  7.  Hypokalemia: Replete and recheck in a.m. All the records are reviewed and case discussed with ED provider. Management plans discussed with the patient and she is in agreement  CODE STATUS: full  TOTAL TIME TAKING CARE OF THIS PATIENT: 44 minutes.    Maretta Overdorf M.D on 11/16/2018 at 6:54 PM  Between 7am to 6pm - Pager - [phone]  After 6pm go to [email or web address] - Social research officer, government  [facility]  Office  [phone]  CC: Primary care physician; Corky Downs, MD

## 2018-11-16 NOTE — ED Provider Notes (Signed)
[facility] Emergency Department Provider Note  ____________________________________________  Time seen: Approximately 1:49 PM  I have reviewed the triage vital signs and the nursing notes.   HISTORY  Chief Complaint Shortness of Breath   HPI Caitlyn Evans is a 53 y.o. female with h/o COPD on 2.5L [place], HFpEF, HTN, HLD, CAd who presents for evaluation of SOB. Patient reports cough and progressively worsening shortness of breath since yesterday.  No fever or chills.  She is complained of chest pain, back pain, hip pain. She is status post thoracic spine fusion on 10/26/2018.  She is on morphine and oxycodone.  She is complaining of significant pain which she has had the entire postop time.  This morning she started having pain in her right hip which is new.  She reports that she could not bear weight due to severe pain.  She denies any trauma to her hip.  Patient denies vomiting, diarrhea, abdominal pain.    Past Medical History:  Diagnosis Date  . Anxiety    panic attacks  . Asthma   . BOOP (bronchiolitis obliterans with organizing pneumonia) (HCC)   . CHF (congestive heart failure) (HCC)   . Dysphagia, pharyngoesophageal phase 05/30/2015  . Emphysema lung (HCC) 05/30/2015   on steroids and nebulizer for copd issues.  09/15/18  . GERD (gastroesophageal reflux disease)   . Hypercholesterolemia   . Hypertension   . Lung mass   . Migraines   . Motion sickness    all moving vehicles  . Myocardial infarction (HCC) 2012  . Respiratory failure with hypercapnia (HCC) 08/2018   was hospitalized and on bipap. steroids and nebulizer treatments helped  . Seasonal allergies   . Shortness of breath dyspnea    1 flight-stairs  . Sternal fracture     Patient Active Problem List   Diagnosis Date Noted  . Acute on chronic respiratory failure with hypoxia (HCC) 09/09/2018  . Esophagitis, unspecified   . Stomach irritation   . Hiatal hernia   . COPD (chronic  obstructive pulmonary disease) (HCC) 07/19/2016  . Acute on chronic respiratory failure (HCC) 05/09/2016  . Noncompliance with medication regimen 05/09/2016  . COPD with acute exacerbation (HCC) 02/05/2016  . Acute exacerbation of chronic obstructive pulmonary disease (COPD) (HCC)   . Acute respiratory failure (HCC)   . Acute respiratory failure with hypoxia (HCC) 12/05/2015  . BOOP (bronchiolitis obliterans with organizing pneumonia) (HCC) 12/05/2015  . Depression, major, single episode, moderate (HCC) 07/14/2015  . Adjustment disorder with anxiety 07/14/2015  . COPD exacerbation (HCC) 07/14/2015  . Special screening for malignant neoplasms, colon   . Swallowing difficulty   . Loss of weight   . Esophagogastric ulcer   . Feline esophagus   . Hypertension 05/30/2015  . Emphysema lung (HCC) 05/30/2015  . Dysphagia, pharyngoesophageal phase 05/30/2015    Past Surgical History:  Procedure Laterality Date  . BACK SURGERY    . COLONOSCOPY WITH PROPOFOL N/A 06/02/2015   Procedure: COLONOSCOPY WITH PROPOFOL;  Surgeon: Midge Minium, MD;  Location: [facility];  Service: Endoscopy;  Laterality: N/A;  . ESOPHAGOGASTRODUODENOSCOPY (EGD) WITH PROPOFOL N/A 06/02/2015   Procedure: ESOPHAGOGASTRODUODENOSCOPY (EGD) WITH PROPOFOL withdialation;  Surgeon: Midge Minium, MD;  Location: [facility];  Service: Endoscopy;  Laterality: N/A;  . ESOPHAGOGASTRODUODENOSCOPY (EGD) WITH PROPOFOL N/A 11/05/2017   Procedure: ESOPHAGOGASTRODUODENOSCOPY (EGD) WITH PROPOFOL;  Surgeon: Pasty Spillers, MD;  Location: [facility];  Service: Endoscopy;  Laterality: N/A;  . KYPHOPLASTY N/A 09/17/2018  Procedure: KYPHOPLASTY T6;  Surgeon: Kennedy Bucker, MD;  Location: [facility];  Service: Orthopedics;  Laterality: N/A;  . LUNG SURGERY Left    Upper lobe removed  . OOPHORECTOMY Left   . VAGINAL HYSTERECTOMY      Prior to Admission medications   Medication Sig Start Date End Date Taking? Authorizing  Provider  ALPRAZolam Prudy Feeler) 0.5 MG tablet Take 1 tablet (0.5 mg total) by mouth 3 (three) times daily. 10/05/18   Enid Baas, MD  diltiazem (CARDIZEM CD) 180 MG 24 hr capsule Take 1 capsule (180 mg total) by mouth daily. 11/26/17   Enid Baas, MD  docusate sodium (COLACE) 100 MG capsule Take 1 capsule (100 mg total) by mouth 2 (two) times daily. 10/05/18   Enid Baas, MD  donepezil (ARICEPT) 10 MG tablet Take 10 mg by mouth daily.     [provider]  doxycycline (VIBRAMYCIN) 100 MG capsule Take 1 capsule (100 mg total) by mouth 2 (two) times daily for 7 days. 11/16/18 11/23/18  Nita Sickle, MD  furosemide (LASIX) 20 MG tablet Take 1 tablet (20 mg total) by mouth daily. 11/26/17   Enid Baas, MD  guaiFENesin (ROBITUSSIN) 100 MG/5ML SOLN Take 5 mLs (100 mg total) by mouth every 4 (four) hours as needed for cough or to loosen phlegm. 09/26/18   Shaune Pollack, MD  ipratropium-albuterol (DUONEB) 0.5-2.5 (3) MG/3ML SOLN Inhale 3 mLs into the lungs every 4 (four) hours as needed (for wheezing/shortness of breath). Patient taking differently: Inhale 3 mLs into the lungs every 4 (four) hours as needed. For wheezing/shortness of breath 05/13/16   Enid Baas, MD  lactulose (CHRONULAC) 10 GM/15ML solution Take 30 mLs (20 g total) by mouth 2 (two) times daily as needed for mild constipation. 10/05/18   Enid Baas, MD  lidocaine (LIDODERM) 5 % Place 1 patch onto the skin daily. Remove & Discard patch within 12 hours or as directed by MD 10/05/18   Enid Baas, MD  metoprolol tartrate (LOPRESSOR) 25 MG tablet Take 1 tablet (25 mg total) by mouth 2 (two) times daily. 11/25/17   Enid Baas, MD  pantoprazole (PROTONIX) 40 MG tablet TAKE ONE TABLET BY MOUTH TWICE DAILY Patient taking differently: Take 40 mg by mouth 2 (two) times daily.  04/03/18   Pasty Spillers, MD  polyethylene glycol (MIRALAX / GLYCOLAX) packet Take 17 g by mouth daily. 10/05/18    Enid Baas, MD  predniSONE (DELTASONE) 20 MG tablet Take 3 tablets (60 mg total) by mouth daily for 4 days. 11/16/18 11/20/18  Nita Sickle, MD  rosuvastatin (CRESTOR) 10 MG tablet Take 10 mg by mouth daily.  10/10/17   [provider]  tamsulosin (FLOMAX) 0.4 MG CAPS capsule Take 1 capsule (0.4 mg total) by mouth daily. 11/26/17   Enid Baas, MD    Allergies Codeine and Penicillins  Family History  Problem Relation Age of Onset  . COPD Mother   . Heart attack Mother   . Stroke Father   . COPD Father     Social History Social History   Tobacco Use  . Smoking status: Former Smoker    Packs/day: 1.00    Years: 30.00    Pack years: 30.00    Types: Cigarettes  . Smokeless tobacco: Never Used  . Tobacco comment: quit some time in 2012  Substance Use Topics  . Alcohol use: No    Alcohol/week: 0.0 standard drinks    Comment: rare consumption  . Drug use: Yes  Types: Marijuana    Review of Systems  Constitutional: Negative for fever. Eyes: Negative for visual changes. ENT: Negative for sore throat. Neck: No neck pain  Cardiovascular: + chest pain. Respiratory: + shortness of breath and cough Gastrointestinal: Negative for abdominal pain, vomiting or diarrhea. Genitourinary: Negative for dysuria. Musculoskeletal: + back pain, R hip pain Skin: Negative for rash. Neurological: Negative for headaches, weakness or numbness. Psych: No SI or HI  ____________________________________________   PHYSICAL EXAM:  VITAL SIGNS: ED Triage Vitals  Enc Vitals Group     BP 11/16/18 1305 128/83     Pulse Rate 11/16/18 1305 75     Resp 11/16/18 1305 16     Temp 11/16/18 1305 97.8 F (36.6 C)     Temp Source 11/16/18 1305 Oral     SpO2 11/16/18 1259 97 %     Weight 11/16/18 1302 130 lb (59 kg)     Height 11/16/18 1302  (1.473 m)     Head Circumference --      Peak Flow --      Pain Score 11/16/18 1301 10     Pain Loc --      Pain Edu? --       Excl. in GC? --     Constitutional: Alert and oriented. Well appearing and in no apparent distress. HEENT:      Head: Normocephalic and atraumatic.         Eyes: Conjunctivae are normal. Sclera is non-icteric.       Mouth/Throat: Mucous membranes are moist.       Neck: Supple with no signs of meningismus. Cardiovascular: Regular rate and rhythm. No murmurs, gallops, or rubs. 2+ symmetrical distal pulses are present in all extremities. No JVD. Respiratory: Mild increased work of breathing, satting 97% on 2 L nasal cannula, significantly reduced air movement bilaterally with diffuse expiratory wheezes  Gastrointestinal: Soft, non tender, and non distended with positive bowel sounds. No rebound or guarding. Musculoskeletal: Asymmetric pitting edema with L>R Neurologic: Normal speech and language. Face is symmetric. Moving all extremities. No gross focal neurologic deficits are appreciated. Skin: Skin is warm, dry and intact. No rash noted. Psychiatric: Mood and affect are normal. Speech and behavior are normal.  ____________________________________________   LABS (all labs ordered are listed, but only abnormal results are displayed)  Labs Reviewed  CBC WITH DIFFERENTIAL/PLATELET - Abnormal; Notable for the following components:      Result Value   WBC 13.2 (*)    MCHC 29.6 (*)    RDW 17.1 (*)    Neutro Abs 10.0 (*)    Monocytes Absolute 1.4 (*)    Abs Immature Granulocytes 0.29 (*)    All other components within normal limits  BASIC METABOLIC PANEL - Abnormal; Notable for the following components:   Potassium 3.2 (*)    Creatinine, Ser 0.43 (*)    Calcium 8.8 (*)    All other components within normal limits  TROPONIN I   ____________________________________________  EKG  ED ECG REPORT I, Nita Sickle, the attending physician, personally viewed and interpreted this ECG.  Normal sinus rhythm, rate of 80, normal intervals, normal axis, no ST elevations or depressions.   Normal EKG. ____________________________________________  RADIOLOGY  I have personally reviewed the images performed during this visit and I agree with the Radiologist's read.   Interpretation by Radiologist:  US Venous Img Lower Bilateral  Result Date: 11/16/2018 CLINICAL DATA:  Leg swelling.  History of back surgery. EXAM: BILATERAL LOWER  EXTREMITY VENOUS DOPPLER ULTRASOUND TECHNIQUE: Gray-scale sonography with graded compression, as well as color Doppler and duplex ultrasound were performed to evaluate the lower extremity deep venous systems from the level of the common femoral vein and including the common femoral, femoral, profunda femoral, popliteal and calf veins including the posterior tibial, peroneal and gastrocnemius veins when visible. The superficial great saphenous vein was also interrogated. Spectral Doppler was utilized to evaluate flow at rest and with distal augmentation maneuvers in the common femoral, femoral and popliteal veins. COMPARISON:  None. FINDINGS: RIGHT LOWER EXTREMITY Common Femoral Vein: No evidence of thrombus. Normal compressibility, respiratory phasicity and response to augmentation. Saphenofemoral Junction: No evidence of thrombus. Normal compressibility and flow on color Doppler imaging. Profunda Femoral Vein: No evidence of thrombus. Normal compressibility and flow on color Doppler imaging. Femoral Vein: No evidence of thrombus. Normal compressibility, respiratory phasicity and response to augmentation. Popliteal Vein: No evidence of thrombus. Normal compressibility, respiratory phasicity and response to augmentation. Calf Veins: No evidence of thrombus. Normal compressibility and flow on color Doppler imaging. Other Findings:  None. LEFT LOWER EXTREMITY Common Femoral Vein: No evidence of thrombus. Normal compressibility, respiratory phasicity and response to augmentation. Saphenofemoral Junction: No evidence of thrombus. Normal compressibility and flow on color  Doppler imaging. Profunda Femoral Vein: No evidence of thrombus. Normal compressibility and flow on color Doppler imaging. Femoral Vein: No evidence of thrombus. Normal compressibility, respiratory phasicity and response to augmentation. Popliteal Vein: No evidence of thrombus. Normal compressibility, respiratory phasicity and response to augmentation. Calf Veins: No evidence of thrombus. Normal compressibility and flow on color Doppler imaging. Other Findings:  None. IMPRESSION: No evidence of deep venous thrombosis in the lower extremities. Electronically Signed   By: Richarda Overlie M.D.   On: 11/16/2018 15:15   Dg Chest Portable 1 View  Result Date: 11/16/2018 CLINICAL DATA:  Shortness of breath since yesterday. History of myocardial infarction, emphysema, LEFT lobectomy. EXAM: PORTABLE CHEST 1 VIEW COMPARISON:  Chest radiograph October 03, 2018 FINDINGS: Cardiomediastinal silhouette is normal. No pleural effusions or focal consolidations. Surgical clips LEFT upper lung zone. Biapical pleural thickening. Trachea projects midline and there is no pneumothorax. Soft tissue planes and included osseous structures are non-suspicious. New cervicothoracic spinal hardware. IMPRESSION: 1. No acute cardiopulmonary process. 2. Postsurgical changes LEFT upper lung zone. Electronically Signed   By: Awilda Metro M.D.   On: 11/16/2018 13:43     ____________________________________________   PROCEDURES  Procedure(s) performed: None Procedures Critical Care performed:  None ____________________________________________   INITIAL IMPRESSION / ASSESSMENT AND PLAN / ED COURSE  53 y.o. female with h/o COPD on 2.5L [place], HFpEF, HTN, HLD, CAd who presents for evaluation of SOB and cough. Patient is postop day 21 from a thoracic fusion.  Currently has asymmetric leg swelling, will get doppler studies to eval for DVT.  Ddx COPD versus pneumonia versus bronchitis versus PE.  With no fever or other flulike symptoms, less  likely to be influenza.  Will start patient on 3 duo nebs, Solu-Medrol, and doxycyline for COPD exacerbation. Patient is also complaining of right hip pain and has mild pain with internal rotation of the right hip.  X-ray has been ordered.  Clinical Course as of Nov 16 1531  Mon Nov 16, 2018  1527 Patient with normal work of breathing, sating well on baseline 2.5L [place]. Moving good air after duonebs. US doppler negative for DVT therefore low suspicion for PE at this time with no tachycardia, new hypoxia, or tachypnea. No evidence  of PNA. XR hip pending. Will dose her home meds (tylenol and oxycodone). Plan to dc home if hip XR is negative.   [CV]    Clinical Course User Index [CV] Don PerkingVeronese, [place], MD     As part of my medical decision making, I reviewed the following data within the electronic MEDICAL RECORD NUMBER Nursing notes reviewed and incorporated, Labs reviewed , EKG interpreted , Old EKG reviewed, Old chart reviewed, Radiograph reviewed , Notes from prior ED visits and [place] Controlled Substance Database    Pertinent labs & imaging results that were available during my care of the patient were reviewed by me and considered in my medical decision making (see chart for details).    ____________________________________________   FINAL CLINICAL IMPRESSION(S) / ED DIAGNOSES  Final diagnoses:  COPD exacerbation (HCC)      NEW MEDICATIONS STARTED DURING THIS VISIT:  ED Discharge Orders         Ordered    predniSONE (DELTASONE) 20 MG tablet  Daily     11/16/18 1532    doxycycline (VIBRAMYCIN) 100 MG capsule  2 times daily     11/16/18 1532           Note:  This document was prepared using Dragon voice recognition software and may include unintentional dictation errors.    Don PerkingVeronese, [place], MD 11/16/18 [phone]

## 2018-11-16 NOTE — ED Notes (Signed)
Patient transported to X-ray 

## 2018-11-16 NOTE — ED Notes (Signed)
Report called to [facility] rn floor nurse

## 2018-11-16 NOTE — Progress Notes (Signed)
Discussed patient with ED staff. Full consult note and formal evaluation to follow tomorrow. Imaging reviewed.   1.Plan for R hip percutaneous pinning tomorrow afternoon  ~1230pm.  2. NPO after midnight 3. Hold anticoagulation in advance of OR 4. Admit to Hospitalist service for medical optimization. 5. Check INR

## 2018-11-16 NOTE — ED Notes (Signed)
Pt alert  Family with pt   Pt watching tv.   Iv in place

## 2018-11-16 NOTE — ED Notes (Signed)
ED TO INPATIENT HANDOFF REPORT  ED Nurse Name and Phone #: Manjinder Breau 4 S Name/Age/Gender Caitlyn Evans 53 y.o. female Room/Bed: ED05A/ED05A  Code Status   Code Status: Full Code  Home/SNF/Other Home Patient oriented to: self, place, time and situation Is this baseline? Yes   Triage Complete: Triage complete  Chief Complaint Difficulty Breathing   Triage Note Pt to ED from home via EMS. Pt c/o SOB starting yesterday and much worse when she woke up today. Pt on 2.5L chronically. Pt hx of COPD and HTN and back surgery fed 22. Pt also c/o R leg and hip pain. Pt agitated and asking for ativan.    Allergies Allergies  Allergen Reactions  . Codeine Hives, Nausea And Vomiting and Nausea Only  . Penicillins Hives and Other (See Comments)    Has patient had a PCN reaction causing immediate rash, facial/tongue/throat swelling, SOB or lightheadedness with hypotension: No Has patient had a PCN reaction causing severe rash involving mucus membranes or skin necrosis: No Has patient had a PCN reaction that required hospitalization No Has patient had a PCN reaction occurring within the last 10 years: No If all of the above answers are "NO", then may proceed with Cephalosporin use. Other reaction(s): Other (See Comments) Has patient had a PCN    Level of Care/Admitting Diagnosis ED Disposition    ED Disposition Condition Comment   Admit  Hospital Area: [facility] [[id number]]  Level of Care: Med-Surg [16]  Diagnosis: Hip fracture [facility]) [[id number]]  Admitting Physician: MODY, Patricia Pesa [[id number]]  Attending Physician: MODY, Patricia Pesa [[id number]]  Estimated length of stay: 3 - 4 days  Certification:: I certify this patient will need inpatient services for at least 2 midnights  PT Class (Do Not Modify): Inpatient [101]  PT Acc Code (Do Not Modify): Private [1]       B Medical/Surgery History Past Medical History:  Diagnosis Date  . Anxiety    panic attacks  . Asthma   . BOOP  (bronchiolitis obliterans with organizing pneumonia) (HCC)   . CHF (congestive heart failure) (HCC)   . Dysphagia, pharyngoesophageal phase 05/30/2015  . Emphysema lung (HCC) 05/30/2015   on steroids and nebulizer for copd issues.  09/15/18  . GERD (gastroesophageal reflux disease)   . Hypercholesterolemia   . Hypertension   . Lung mass   . Migraines   . Motion sickness    all moving vehicles  . Myocardial infarction (HCC) 2012  . Respiratory failure with hypercapnia (HCC) 08/2018   was hospitalized and on bipap. steroids and nebulizer treatments helped  . Seasonal allergies   . Shortness of breath dyspnea    1 flight-stairs  . Sternal fracture    Past Surgical History:  Procedure Laterality Date  . BACK SURGERY    . COLONOSCOPY WITH PROPOFOL N/A 06/02/2015   Procedure: COLONOSCOPY WITH PROPOFOL;  Surgeon: Midge Minium, MD;  Location: [facility];  Service: Endoscopy;  Laterality: N/A;  . ESOPHAGOGASTRODUODENOSCOPY (EGD) WITH PROPOFOL N/A 06/02/2015   Procedure: ESOPHAGOGASTRODUODENOSCOPY (EGD) WITH PROPOFOL withdialation;  Surgeon: Midge Minium, MD;  Location: [facility];  Service: Endoscopy;  Laterality: N/A;  . ESOPHAGOGASTRODUODENOSCOPY (EGD) WITH PROPOFOL N/A 11/05/2017   Procedure: ESOPHAGOGASTRODUODENOSCOPY (EGD) WITH PROPOFOL;  Surgeon: Pasty Spillers, MD;  Location: [facility];  Service: Endoscopy;  Laterality: N/A;  . KYPHOPLASTY N/A 09/17/2018   Procedure: KYPHOPLASTY T6;  Surgeon: Kennedy Bucker, MD;  Location: [facility];  Service: Orthopedics;  Laterality: N/A;  . LUNG SURGERY Left  Upper lobe removed  . OOPHORECTOMY Left   . VAGINAL HYSTERECTOMY       A IV Location/Drains/Wounds Patient Lines/Drains/Airways Status   Active Line/Drains/Airways    Name:   Placement date:   Placement time:   Site:   Days:   Incision (Closed) 06/02/15 Lip Other (Comment)   06/02/15    1036     1263   Incision (Closed) 06/02/15 Rectum Other (Comment)   06/02/15     1036     1263   Incision (Closed) 09/17/18 Back Other (Comment)   09/17/18    0909     60          Intake/Output Last 24 hours No intake or output data in the 24 hours ending 11/16/18 1938  Labs/Imaging Results for orders placed or performed during the hospital encounter of 11/16/18 (from the past 48 hour(s))  CBC with Differential/Platelet     Status: Abnormal   Collection Time: 11/16/18  1:20 PM  Result Value Ref Range   WBC 13.2 (H) 4.0 - 10.5 K/uL   RBC 4.58 3.87 - 5.11 MIL/uL   Hemoglobin 13.5 12.0 - 15.0 g/dL   HCT 00.5 11.0 - 21.1 %   MCV 99.6 80.0 - 100.0 fL   MCH 29.5 26.0 - 34.0 pg   MCHC 29.6 (L) 30.0 - 36.0 g/dL   RDW 17.3 (H) 56.7 - 01.4 %   Platelets 296 150 - 400 K/uL   nRBC 0.0 0.0 - 0.2 %   Neutrophils Relative % 75 %   Neutro Abs 10.0 (H) 1.7 - 7.7 K/uL   Lymphocytes Relative 10 %   Lymphs Abs 1.3 0.7 - 4.0 K/uL   Monocytes Relative 11 %   Monocytes Absolute 1.4 (H) 0.1 - 1.0 K/uL   Eosinophils Relative 1 %   Eosinophils Absolute 0.1 0.0 - 0.5 K/uL   Basophils Relative 1 %   Basophils Absolute 0.1 0.0 - 0.1 K/uL   Immature Granulocytes 2 %   Abs Immature Granulocytes 0.29 (H) 0.00 - 0.07 K/uL    Comment: Performed at [facility], [address] [place], [place] [address]  Basic metabolic panel     Status: Abnormal   Collection Time: 11/16/18  1:20 PM  Result Value Ref Range   Sodium 144 135 - 145 mmol/L   Potassium 3.2 (L) 3.5 - 5.1 mmol/L   Chloride 103 98 - 111 mmol/L   CO2 32 22 - 32 mmol/L   Glucose, Bld 91 70 - 99 mg/dL   BUN 17 6 - 20 mg/dL   Creatinine, Ser 3.14 (L) 0.44 - 1.00 mg/dL   Calcium 8.8 (L) 8.9 - 10.3 mg/dL   GFR calc non Af Amer >60 >60 mL/min   GFR calc Af Amer >60 >60 mL/min   Anion gap 9 5 - 15    Comment: Performed at [facility], [address] [place], [place] [address]  Troponin I - ONCE - STAT     Status: None   Collection Time: 11/16/18  1:20 PM  Result Value Ref Range   Troponin I <0.03  <0.03 ng/mL    Comment: Performed at [facility], [address] [place], [place] [address]  Type and screen [facility]     Status: None (Preliminary result)   Collection Time: 11/16/18  6:40 PM  Result Value Ref Range   ABO/RH(D) PENDING    Antibody Screen PENDING    Sample Expiration  11/19/2018 Performed at [facility], [address] [place], [place] [address]   Protime-INR     Status: None   Collection Time: 11/16/18  6:51 PM  Result Value Ref Range   Prothrombin Time 12.8 11.4 - 15.2 seconds   INR 1.0 0.8 - 1.2    Comment: (NOTE) INR goal varies based on device and disease states. Performed at [facility], [address] [place], [place] [address]   APTT     Status: None   Collection Time: 11/16/18  6:51 PM  Result Value Ref Range   aPTT 27 24 - 36 seconds    Comment: Performed at [facility], [address] [place], [place] [address]   Ct Hip Right Wo Contrast  Result Date: 11/16/2018 CLINICAL DATA:  Severe right hip pain.  No known injury. EXAM: CT OF THE RIGHT HIP WITHOUT CONTRAST TECHNIQUE: Multidetector CT imaging of the right hip was performed according to the standard protocol. Multiplanar CT image reconstructions were also generated. COMPARISON:  Right hip x-rays from same day. FINDINGS: Bones/Joint/Cartilage Acute, minimally impacted fracture of the right femoral neck. No dislocation. The right hip joint space is preserved. No significant joint effusion. Ligaments Suboptimally assessed by CT. Muscles and Tendons Intact. Soft tissues Unremarkable. IMPRESSION: 1. Acute, minimally impacted fracture of the right femoral neck. Electronically Signed   By: Obie Dredge M.D.   On: 11/16/2018 17:23   US Venous Img Lower Bilateral  Result Date: 11/16/2018 CLINICAL DATA:  Leg swelling.  History of back surgery. EXAM: BILATERAL LOWER EXTREMITY VENOUS DOPPLER ULTRASOUND TECHNIQUE: Gray-scale sonography with  graded compression, as well as color Doppler and duplex ultrasound were performed to evaluate the lower extremity deep venous systems from the level of the common femoral vein and including the common femoral, femoral, profunda femoral, popliteal and calf veins including the posterior tibial, peroneal and gastrocnemius veins when visible. The superficial great saphenous vein was also interrogated. Spectral Doppler was utilized to evaluate flow at rest and with distal augmentation maneuvers in the common femoral, femoral and popliteal veins. COMPARISON:  None. FINDINGS: RIGHT LOWER EXTREMITY Common Femoral Vein: No evidence of thrombus. Normal compressibility, respiratory phasicity and response to augmentation. Saphenofemoral Junction: No evidence of thrombus. Normal compressibility and flow on color Doppler imaging. Profunda Femoral Vein: No evidence of thrombus. Normal compressibility and flow on color Doppler imaging. Femoral Vein: No evidence of thrombus. Normal compressibility, respiratory phasicity and response to augmentation. Popliteal Vein: No evidence of thrombus. Normal compressibility, respiratory phasicity and response to augmentation. Calf Veins: No evidence of thrombus. Normal compressibility and flow on color Doppler imaging. Other Findings:  None. LEFT LOWER EXTREMITY Common Femoral Vein: No evidence of thrombus. Normal compressibility, respiratory phasicity and response to augmentation. Saphenofemoral Junction: No evidence of thrombus. Normal compressibility and flow on color Doppler imaging. Profunda Femoral Vein: No evidence of thrombus. Normal compressibility and flow on color Doppler imaging. Femoral Vein: No evidence of thrombus. Normal compressibility, respiratory phasicity and response to augmentation. Popliteal Vein: No evidence of thrombus. Normal compressibility, respiratory phasicity and response to augmentation. Calf Veins: No evidence of thrombus. Normal compressibility and flow on  color Doppler imaging. Other Findings:  None. IMPRESSION: No evidence of deep venous thrombosis in the lower extremities. Electronically Signed   By: Richarda Overlie M.D.   On: 11/16/2018 15:15   Dg Chest Portable 1 View  Result Date: 11/16/2018 CLINICAL DATA:  Shortness of breath since yesterday. History of myocardial infarction, emphysema, LEFT lobectomy. EXAM:  PORTABLE CHEST 1 VIEW COMPARISON:  Chest radiograph October 03, 2018 FINDINGS: Cardiomediastinal silhouette is normal. No pleural effusions or focal consolidations. Surgical clips LEFT upper lung zone. Biapical pleural thickening. Trachea projects midline and there is no pneumothorax. Soft tissue planes and included osseous structures are non-suspicious. New cervicothoracic spinal hardware. IMPRESSION: 1. No acute cardiopulmonary process. 2. Postsurgical changes LEFT upper lung zone. Electronically Signed   By: Awilda Metro M.D.   On: 11/16/2018 13:43   Dg Hip Unilat W Or Wo Pelvis 2-3 Views Right  Result Date: 11/16/2018 CLINICAL DATA:  Hip pain EXAM: DG HIP (WITH OR WITHOUT PELVIS) 2-3V RIGHT COMPARISON:  None. FINDINGS: There is cortical irregularity noted in the femoral neck region. Concern for possible nondisplaced femoral neck fracture. No subluxation or dislocation. IMPRESSION: Concern for subtle nondisplaced right femoral neck fracture. This could be confirmed with CT. Electronically Signed   By: Charlett Nose M.D.   On: 11/16/2018 16:00    Pending Labs Unresulted Labs (From admission, onward)    Start     Ordered   11/17/18 0500  Basic metabolic panel  Tomorrow morning,   STAT     11/16/18 1853   11/17/18 0500  CBC  Tomorrow morning,   STAT     11/16/18 1853          Vitals/Pain Today's Vitals   11/16/18 1601 11/16/18 1603 11/16/18 1630 11/16/18 1800  BP: (!) 148/67  123/71 (!) 144/77  Pulse:  69 66 79  Resp: Temp:      TempSrc:      SpO2:  95% 97% 97%  Weight:      Height:      PainSc:        Isolation  Precautions No active isolations  Medications Medications  ipratropium-albuterol (DUONEB) 0.5-2.5 (3) MG/3ML nebulizer solution 3 mL (has no administration in time range)  methylPREDNISolone sodium succinate (SOLU-MEDROL) 40 mg/mL injection 40 mg (has no administration in time range)  polyethylene glycol (MIRALAX / GLYCOLAX) packet 17 g (has no administration in time range)  acetaminophen (TYLENOL) tablet 650 mg (has no administration in time range)    Or  acetaminophen (TYLENOL) suppository 650 mg (has no administration in time range)  ondansetron (ZOFRAN) tablet 4 mg (has no administration in time range)    Or  ondansetron (ZOFRAN) injection 4 mg (has no administration in time range)  bisacodyl (DULCOLAX) suppository 10 mg (has no administration in time range)  doxycycline (VIBRA-TABS) tablet 100 mg (has no administration in time range)  ipratropium-albuterol (DUONEB) 0.5-2.5 (3) MG/3ML nebulizer solution 3 mL (3 mLs Nebulization Given 11/16/18 1338)  ipratropium-albuterol (DUONEB) 0.5-2.5 (3) MG/3ML nebulizer solution 3 mL (3 mLs Nebulization Given 11/16/18 1338)  ipratropium-albuterol (DUONEB) 0.5-2.5 (3) MG/3ML nebulizer solution 3 mL (3 mLs Nebulization Given 11/16/18 1338)  methylPREDNISolone sodium succinate (SOLU-MEDROL) 125 mg/2 mL injection 125 mg (125 mg Intravenous Given 11/16/18 1335)  acetaminophen (TYLENOL) tablet 1,000 mg (1,000 mg Oral Given 11/16/18 1552)  oxyCODONE (Oxy IR/ROXICODONE) immediate release tablet 5 mg (5 mg Oral Given 11/16/18 1552)  doxycycline (VIBRA-TABS) tablet 100 mg (100 mg Oral Given 11/16/18 1553)    Mobility walks Moderate fall risk   Focused Assessments Cardiac Assessment Handoff:  Cardiac Rhythm: Normal sinus rhythm Lab Results  Component Value Date   CKTOTAL 29 10/13/2011   CKMB 1.1 10/13/2011   TROPONINI <0.03 11/16/2018   No results found for: DDIMER Does the Patient currently have chest pain? No  R Recommendations: See Admitting  Provider Note  Report given to:   Additional Notes: none

## 2018-11-16 NOTE — ED Triage Notes (Signed)
Pt to ED from home via EMS. Pt c/o SOB starting yesterday and much worse when she woke up today. Pt on 2.5L chronically. Pt hx of COPD and HTN and back surgery fed 22. Pt also c/o R leg and hip pain. Pt agitated and asking for ativan.

## 2018-11-16 NOTE — ED Notes (Signed)
Resumed care from paige rn.  Pt sleepy.  arousable and answering questions.  nsr on monitor.

## 2018-11-17 ENCOUNTER — Encounter
Admission: EM | Disposition: A | Discharge status: Home Health | Payer: Self-pay | Source: Home / Self Care | Attending: Internal Medicine

## 2018-11-17 ENCOUNTER — Inpatient Hospital Stay: Payer: Medicare Other | Admitting: Anesthesiology

## 2018-11-17 ENCOUNTER — Encounter: Payer: Self-pay | Admitting: *Deleted

## 2018-11-17 ENCOUNTER — Inpatient Hospital Stay: Payer: Medicare Other

## 2018-11-17 HISTORY — PX: HIP PINNING,CANNULATED: SHX1758

## 2018-11-17 LAB — URINE DRUG SCREEN, QUALITATIVE (ARMC ONLY)
Amphetamines, Ur Screen: NOT DETECTED
BENZODIAZEPINE, UR SCRN: POSITIVE — AB
Barbiturates, Ur Screen: NOT DETECTED
Cannabinoid 50 Ng, Ur ~~LOC~~: POSITIVE — AB
Cocaine Metabolite,Ur ~~LOC~~: NOT DETECTED
MDMA (Ecstasy)Ur Screen: NOT DETECTED
Methadone Scn, Ur: NOT DETECTED
Opiate, Ur Screen: POSITIVE — AB
Phencyclidine (PCP) Ur S: NOT DETECTED
Tricyclic, Ur Screen: NOT DETECTED

## 2018-11-17 LAB — BASIC METABOLIC PANEL
Anion gap: 13 (ref 5–15)
BUN: 13 mg/dL (ref 6–20)
CALCIUM: 9 mg/dL (ref 8.9–10.3)
CO2: 29 mmol/L (ref 22–32)
Chloride: 100 mmol/L (ref 98–111)
Creatinine, Ser: 0.41 mg/dL — ABNORMAL LOW (ref 0.44–1.00)
GFR calc non Af Amer: >60 mL/min (ref >60)
Glucose, Bld: 137 mg/dL — ABNORMAL HIGH (ref 70–99)
Potassium: 3.8 mmol/L (ref 3.5–5.1)
Sodium: 142 mmol/L (ref 135–145)

## 2018-11-17 LAB — CBC
HCT: 42.2 % (ref 36.0–46.0)
Hemoglobin: 12.9 g/dL (ref 12.0–15.0)
MCH: 29.1 pg (ref 26.0–34.0)
MCHC: 30.6 g/dL (ref 30.0–36.0)
MCV: 95.3 fL (ref 80.0–100.0)
Platelets: 298 10*3/uL (ref 150–400)
RBC: 4.43 MIL/uL (ref 3.87–5.11)
RDW: 16 % — ABNORMAL HIGH (ref 11.5–15.5)
WBC: 10.8 10*3/uL — ABNORMAL HIGH (ref 4.0–10.5)
nRBC: 0 % (ref 0.0–0.2)

## 2018-11-17 LAB — MRSA PCR SCREENING: MRSA by PCR: NEGATIVE

## 2018-11-17 SURGERY — FIXATION, FEMUR, NECK, PERCUTANEOUS, USING SCREW
Anesthesia: General | Laterality: Right

## 2018-11-17 MED ORDER — DOCUSATE SODIUM 100 MG PO CAPS
100.0000 mg | ORAL_CAPSULE | Freq: Two times a day (BID) | ORAL | Status: DC
  Administered 2018-11-17 – 2018-11-20 (×4): 100 mg via ORAL
  Filled 2018-11-17 (×4): qty 1

## 2018-11-17 MED ORDER — METOCLOPRAMIDE HCL 10 MG PO TABS: 5.0000 mg | ORAL_TABLET | Freq: Three times a day (TID) | ORAL | Status: DC | PRN

## 2018-11-17 MED ORDER — ENOXAPARIN SODIUM 40 MG/0.4ML ~~LOC~~ SOLN
40.0000 mg | SUBCUTANEOUS | Status: DC
  Administered 2018-11-18 – 2018-11-20 (×3): 40 mg via SUBCUTANEOUS
  Filled 2018-11-17 (×3): qty 0.4

## 2018-11-17 MED ORDER — OXYCODONE HCL 5 MG PO TABS
5.0000 mg | ORAL_TABLET | ORAL | Status: DC | PRN
  Administered 2018-11-17 – 2018-11-20 (×10): 5 mg via ORAL
  Filled 2018-11-17 (×10): qty 1

## 2018-11-17 MED ORDER — ACETAMINOPHEN 10 MG/ML IV SOLN
INTRAVENOUS | Status: AC
  Filled 2018-11-17: qty 100

## 2018-11-17 MED ORDER — MIDAZOLAM HCL 5 MG/5ML IJ SOLN
INTRAMUSCULAR | Status: DC | PRN
  Administered 2018-11-17: 2 mg via INTRAVENOUS

## 2018-11-17 MED ORDER — FENTANYL CITRATE (PF) 100 MCG/2ML IJ SOLN
INTRAMUSCULAR | Status: AC
  Filled 2018-11-17: qty 2

## 2018-11-17 MED ORDER — OXYCODONE HCL 5 MG PO TABS: 2.5000 mg | ORAL_TABLET | ORAL | Status: DC | PRN

## 2018-11-17 MED ORDER — LIDOCAINE HCL (PF) 2 % IJ SOLN
INTRAMUSCULAR | Status: AC
  Filled 2018-11-17: qty 10

## 2018-11-17 MED ORDER — SODIUM CHLORIDE (PF) 0.9 % IJ SOLN
INTRAMUSCULAR | Status: AC
  Filled 2018-11-17: qty 10

## 2018-11-17 MED ORDER — CEFAZOLIN SODIUM-DEXTROSE 1-4 GM/50ML-% IV SOLN
1.0000 g | Freq: Four times a day (QID) | INTRAVENOUS | Status: AC
  Administered 2018-11-17 – 2018-11-18 (×3): 1 g via INTRAVENOUS
  Filled 2018-11-17 (×3): qty 50

## 2018-11-17 MED ORDER — ACETAMINOPHEN 325 MG PO TABS: 325.0000 mg | ORAL_TABLET | Freq: Four times a day (QID) | ORAL | Status: DC | PRN

## 2018-11-17 MED ORDER — LIDOCAINE HCL (CARDIAC) PF 100 MG/5ML IV SOSY
PREFILLED_SYRINGE | INTRAVENOUS | Status: DC | PRN
  Administered 2018-11-17: 100 mg via INTRAVENOUS

## 2018-11-17 MED ORDER — BISACODYL 10 MG RE SUPP
10.0000 mg | Freq: Every day | RECTAL | Status: DC | PRN
  Filled 2018-11-17: qty 1

## 2018-11-17 MED ORDER — FLEET ENEMA 7-19 GM/118ML RE ENEM: 1.0000 | ENEMA | Freq: Once | RECTAL | Status: DC | PRN

## 2018-11-17 MED ORDER — METHYLPREDNISOLONE SODIUM SUCC 125 MG IJ SOLR: 60.0000 mg | INTRAMUSCULAR | Status: DC

## 2018-11-17 MED ORDER — BUPIVACAINE HCL (PF) 0.5 % IJ SOLN
INTRAMUSCULAR | Status: AC
  Filled 2018-11-17: qty 10

## 2018-11-17 MED ORDER — SODIUM CHLORIDE 0.9 % IV SOLN
INTRAVENOUS | Status: DC
  Administered 2018-11-17 – 2018-11-18 (×2): via INTRAVENOUS

## 2018-11-17 MED ORDER — DIPHENHYDRAMINE HCL 12.5 MG/5ML PO ELIX: 12.5000 mg | ORAL_SOLUTION | ORAL | Status: DC | PRN

## 2018-11-17 MED ORDER — LACTATED RINGERS IV SOLN
INTRAVENOUS | Status: DC
  Administered 2018-11-17: 13:00:00 via INTRAVENOUS

## 2018-11-17 MED ORDER — FENTANYL CITRATE (PF) 100 MCG/2ML IJ SOLN
INTRAMUSCULAR | Status: DC | PRN
  Administered 2018-11-17 (×4): 50 ug via INTRAVENOUS

## 2018-11-17 MED ORDER — SODIUM CHLORIDE FLUSH 0.9 % IV SOLN
INTRAVENOUS | Status: AC
  Filled 2018-11-17: qty 10

## 2018-11-17 MED ORDER — FENTANYL CITRATE (PF) 100 MCG/2ML IJ SOLN
INTRAMUSCULAR | Status: AC
  Administered 2018-11-17: 50 ug via INTRAVENOUS
  Filled 2018-11-17: qty 2

## 2018-11-17 MED ORDER — CEFAZOLIN SODIUM 1 G IJ SOLR
INTRAMUSCULAR | Status: AC
  Filled 2018-11-17: qty 10

## 2018-11-17 MED ORDER — PREDNISONE 10 MG PO TABS
10.0000 mg | ORAL_TABLET | Freq: Two times a day (BID) | ORAL | Status: DC
  Administered 2018-11-18 – 2018-11-20 (×5): 10 mg via ORAL
  Filled 2018-11-17 (×5): qty 1

## 2018-11-17 MED ORDER — SODIUM CHLORIDE 0.9 % IV SOLN
INTRAVENOUS | Status: DC | PRN
  Administered 2018-11-17: 50 ug/min via INTRAVENOUS

## 2018-11-17 MED ORDER — FENTANYL CITRATE (PF) 100 MCG/2ML IJ SOLN
25.0000 ug | INTRAMUSCULAR | Status: DC | PRN
  Administered 2018-11-17 (×4): 50 ug via INTRAVENOUS

## 2018-11-17 MED ORDER — HYDROMORPHONE HCL 1 MG/ML IJ SOLN: 0.2500 mg | INTRAMUSCULAR | Status: DC | PRN

## 2018-11-17 MED ORDER — CEFAZOLIN SODIUM-DEXTROSE 1-4 GM/50ML-% IV SOLN
1.0000 g | Freq: Once | INTRAVENOUS | Status: AC
  Administered 2018-11-17: 1 g via INTRAVENOUS

## 2018-11-17 MED ORDER — METOCLOPRAMIDE HCL 5 MG/ML IJ SOLN: 5.0000 mg | Freq: Three times a day (TID) | INTRAMUSCULAR | Status: DC | PRN

## 2018-11-17 MED ORDER — PROPOFOL 10 MG/ML IV BOLUS
INTRAVENOUS | Status: DC | PRN
  Administered 2018-11-17: 150 mg via INTRAVENOUS

## 2018-11-17 MED ORDER — ACETAMINOPHEN 500 MG PO TABS
1000.0000 mg | ORAL_TABLET | Freq: Three times a day (TID) | ORAL | Status: AC
  Administered 2018-11-17 – 2018-11-18 (×3): 1000 mg via ORAL
  Filled 2018-11-17 (×3): qty 2

## 2018-11-17 MED ORDER — ACETAMINOPHEN 10 MG/ML IV SOLN
INTRAVENOUS | Status: DC | PRN
  Administered 2018-11-17: 1000 mg via INTRAVENOUS

## 2018-11-17 MED ORDER — BUPIVACAINE LIPOSOME 1.3 % IJ SUSP
INTRAMUSCULAR | Status: AC
  Filled 2018-11-17: qty 20

## 2018-11-17 MED ORDER — PROPOFOL 500 MG/50ML IV EMUL
INTRAVENOUS | Status: AC
  Filled 2018-11-17: qty 50

## 2018-11-17 MED ORDER — SUGAMMADEX SODIUM 200 MG/2ML IV SOLN
INTRAVENOUS | Status: DC | PRN
  Administered 2018-11-17: 200 mg via INTRAVENOUS

## 2018-11-17 MED ORDER — ROCURONIUM BROMIDE 100 MG/10ML IV SOLN
INTRAVENOUS | Status: DC | PRN
  Administered 2018-11-17: 5 mg via INTRAVENOUS
  Administered 2018-11-17: 25 mg via INTRAVENOUS
  Administered 2018-11-17: 20 mg via INTRAVENOUS

## 2018-11-17 MED ORDER — ONDANSETRON HCL 4 MG/2ML IJ SOLN: 4.0000 mg | Freq: Four times a day (QID) | INTRAMUSCULAR | Status: DC | PRN

## 2018-11-17 MED ORDER — SUCCINYLCHOLINE CHLORIDE 20 MG/ML IJ SOLN
INTRAMUSCULAR | Status: DC | PRN
  Administered 2018-11-17: 100 mg via INTRAVENOUS

## 2018-11-17 MED ORDER — MIDAZOLAM HCL 2 MG/2ML IJ SOLN
INTRAMUSCULAR | Status: AC
  Filled 2018-11-17: qty 2

## 2018-11-17 MED ORDER — BUPIVACAINE LIPOSOME 1.3 % IJ SUSP
INTRAMUSCULAR | Status: DC | PRN
  Administered 2018-11-17: 40 mL

## 2018-11-17 MED ORDER — TRAMADOL HCL 50 MG PO TABS
50.0000 mg | ORAL_TABLET | Freq: Four times a day (QID) | ORAL | Status: DC | PRN
  Administered 2018-11-18: 50 mg via ORAL
  Filled 2018-11-17: qty 1

## 2018-11-17 MED ORDER — ONDANSETRON HCL 4 MG PO TABS: 4.0000 mg | ORAL_TABLET | Freq: Four times a day (QID) | ORAL | Status: DC | PRN

## 2018-11-17 SURGICAL SUPPLY — 41 items
3.2mm guide pin ×6 IMPLANT
BIT DRILL 4.9 CANNULATED (BIT) ×1
BIT DRILL CANN QC 4.9 LRG (BIT) IMPLANT
CANISTER SUCT 1200ML W/VALVE (MISCELLANEOUS) ×3 IMPLANT
CHLORAPREP W/TINT 26ML (MISCELLANEOUS) ×3 IMPLANT
COVER WAND RF STERILE (DRAPES) ×1 IMPLANT
DRAPE SURG 17X11 SM STRL (DRAPES) ×3 IMPLANT
DRAPE U-SHAPE 47X51 STRL (DRAPES) ×6 IMPLANT
DRILL BIT CANNULATED 4.9 (BIT) ×2
DRSG OPSITE POSTOP 4X6 (GAUZE/BANDAGES/DRESSINGS) ×2 IMPLANT
DRSG TEGADERM 6X8 (GAUZE/BANDAGES/DRESSINGS) ×3 IMPLANT
ELECT REM PT RETURN 9FT ADLT (ELECTROSURGICAL) ×3
ELECTRODE REM PT RTRN 9FT ADLT (ELECTROSURGICAL) ×1 IMPLANT
GAUZE PETRO XEROFOAM 1X8 (MISCELLANEOUS) ×3 IMPLANT
GAUZE SPONGE 4X4 12PLY STRL (GAUZE/BANDAGES/DRESSINGS) ×3 IMPLANT
GLOVE BIOGEL PI IND STRL 8 (GLOVE) ×1 IMPLANT
GLOVE BIOGEL PI INDICATOR 8 (GLOVE) ×2
GLOVE SURG SYN 7.5  E (GLOVE) ×4
GLOVE SURG SYN 7.5 E (GLOVE) ×2 IMPLANT
GLOVE SURG SYN 7.5 PF PI (GLOVE) ×2 IMPLANT
GOWN STRL REUS W/ TWL LRG LVL3 (GOWN DISPOSABLE) ×1 IMPLANT
GOWN STRL REUS W/ TWL XL LVL3 (GOWN DISPOSABLE) ×1 IMPLANT
GOWN STRL REUS W/TWL LRG LVL3 (GOWN DISPOSABLE) ×2
GOWN STRL REUS W/TWL XL LVL3 (GOWN DISPOSABLE) ×2
GUIDEWIRE ASNIS 3.2 NONCAL (WIRE) ×6 IMPLANT
KIT TURNOVER CYSTO (KITS) ×3 IMPLANT
NDL FILTER BLUNT 18X1 1/2 (NEEDLE) ×1 IMPLANT
NEEDLE FILTER BLUNT 18X 1/2SAF (NEEDLE) ×2
NEEDLE FILTER BLUNT 18X1 1/2 (NEEDLE) ×1 IMPLANT
NEEDLE HYPO 22GX1.5 SAFETY (NEEDLE) ×2 IMPLANT
NS IRRIG 500ML POUR BTL (IV SOLUTION) ×3 IMPLANT
PACK HIP COMPR (MISCELLANEOUS) ×3 IMPLANT
SCREW ASNIS 85MM (Screw) ×2 IMPLANT
SCREW CANN 6.5X80 NONSTRL (Screw) ×4 IMPLANT
STAPLER SKIN PROX 35W (STAPLE) ×5 IMPLANT
SUT VIC AB 0 CT1 36 (SUTURE) ×3 IMPLANT
SUT VIC AB 2-0 CT1 27 (SUTURE) ×2
SUT VIC AB 2-0 CT1 TAPERPNT 27 (SUTURE) ×1 IMPLANT
SYR 5ML LL (SYRINGE) ×3 IMPLANT
TAPE MICROFOAM 4IN (TAPE) ×3 IMPLANT
WASHER SCREW MATTA SS 13.0X1.5 (Washer) ×6 IMPLANT

## 2018-11-17 NOTE — Anesthesia Procedure Notes (Signed)
Procedure Name: Intubation Date/Time: 11/17/2018 1:06 PM Performed by: Ginger Carne, CRNA Pre-anesthesia Checklist: Patient identified, Emergency Drugs available, Suction available, Patient being monitored and Timeout performed Patient Re-evaluated:Patient Re-evaluated prior to induction Oxygen Delivery Method: Circle system utilized Preoxygenation: Pre-oxygenation with 100% oxygen Induction Type: IV induction Ventilation: Mask ventilation without difficulty Laryngoscope Size: McGraph and 3 Grade View: Grade I Tube type: Oral Tube size: 7.0 mm Number of attempts: 2 Airway Equipment and Method: Stylet,  Video-laryngoscopy and Oral airway Placement Confirmation: ETT inserted through vocal cords under direct vision,  positive ETCO2 and breath sounds checked- equal and bilateral Secured at: 20 cm Tube secured with: Tape Dental Injury: Teeth and Oropharynx as per pre-operative assessment  Difficulty Due To: Difficulty was anticipated and Difficult Airway- due to anterior larynx Future Recommendations: Recommend- induction with short-acting agent, and alternative techniques readily available

## 2018-11-17 NOTE — Anesthesia Postprocedure Evaluation (Signed)
Anesthesia Post Note  Patient: Caitlyn Evans  Procedure(s) Performed: CANNULATED HIP PINNING (Right )  Patient location during evaluation: PACU Anesthesia Type: General Level of consciousness: awake and alert Pain management: pain level controlled Vital Signs Assessment: post-procedure vital signs reviewed and stable Respiratory status: spontaneous breathing, nonlabored ventilation, respiratory function stable and patient connected to nasal cannula oxygen Cardiovascular status: blood pressure returned to baseline and stable Postop Assessment: no apparent nausea or vomiting Anesthetic complications: no     Last Vitals:  Vitals:   11/17/18 1530 11/17/18 1536  BP: 125/73 115/74  Pulse: 79   Resp: (!) 0   Temp:  37.4 C  SpO2: 94%     Last Pain:  Vitals:   11/17/18 1536  TempSrc:   PainSc: 4                  Cleda Mccreedy Brinna Divelbiss

## 2018-11-17 NOTE — Progress Notes (Signed)
[facility] - [place] at [facility]   PATIENT NAME: Caitlyn Evans    MR#:  [id number]  DATE OF BIRTH:  Jan 22, 1966  SUBJECTIVE:  Pt came in with increasing right hip pain for few days. Denies fall or any trauma. Found to have right hip fracture. Had spinal fusion surgery about 1 month ago anxious  REVIEW OF SYSTEMS:   Review of Systems  Constitutional: Negative for chills, fever and weight loss.  HENT: Negative for ear discharge, ear pain and nosebleeds.   Eyes: Negative for blurred vision, pain and discharge.  Respiratory: Negative for sputum production, shortness of breath, wheezing and stridor.   Cardiovascular: Negative for chest pain, palpitations, orthopnea and PND.  Gastrointestinal: Negative for abdominal pain, diarrhea, nausea and vomiting.  Genitourinary: Negative for frequency and urgency.  Musculoskeletal: Negative for back pain and joint pain.  Neurological: Negative for sensory change, speech change, focal weakness and weakness.  Psychiatric/Behavioral: Negative for depression and hallucinations. The patient is not nervous/anxious.    Tolerating Diet:npo Tolerating PT: pending  DRUG ALLERGIES:   Allergies  Allergen Reactions  . Codeine Hives, Nausea And Vomiting and Nausea Only  . Penicillins Hives and Other (See Comments)    Has patient had a PCN reaction causing immediate rash, facial/tongue/throat swelling, SOB or lightheadedness with hypotension: No Has patient had a PCN reaction causing severe rash involving mucus membranes or skin necrosis: No Has patient had a PCN reaction that required hospitalization No Has patient had a PCN reaction occurring within the last 10 years: No If all of the above answers are "NO", then may proceed with Cephalosporin use. Other reaction(s): Other (See Comments) Has patient had a PCN    VITALS:  Blood pressure (!) 150/90, pulse 67, temperature (!) 97.5 F (36.4 C), temperature source Oral, resp.  rate 16, height 4\' 10"  (1.473 m), weight 59 kg, SpO2 99 %.  PHYSICAL EXAMINATION:   Physical Exam  GENERAL:  53 y.o.-year-old patient lying in the bed with no acute distress. Cushingoid face, anxious EYES: Pupils equal, round, reactive to light and accommodation. No scleral icterus. Extraocular muscles intact.  HEENT: Head atraumatic, normocephalic. Oropharynx and nasopharynx clear.  NECK:  Supple, no jugular venous distention. No thyroid enlargement, no tenderness.  LUNGS: distant breath sounds bilaterally, no wheezing, rales, rhonchi. No use of accessory muscles of respiration.  CARDIOVASCULAR: S1, S2 normal. No murmurs, rubs, or gallops.  ABDOMEN: Soft, nontender, nondistended. Bowel sounds present. No organomegaly or mass.  EXTREMITIES: No cyanosis, clubbing or edema b/l.   Decreased ROM right hip NEUROLOGIC: Cranial nerves II through XII are intact. No focal Motor or sensory deficits b/l.   PSYCHIATRIC:  patient is alert and oriented x 3.  SKIN: No obvious rash, lesion, or ulcer.   LABORATORY PANEL:  CBC Recent Labs  Lab 11/17/18 0317  WBC 10.8*  HGB 12.9  HCT 42.2  PLT 298    Chemistries  Recent Labs  Lab 11/17/18 0317  NA 142  K 3.8  CL 100  CO2 29  GLUCOSE 137*  BUN 13  CREATININE 0.41*  CALCIUM 9.0   Cardiac Enzymes Recent Labs  Lab 11/16/18 1320  TROPONINI <0.03   RADIOLOGY:  Ct Hip Right Wo Contrast  Result Date: 11/16/2018 CLINICAL DATA:  Severe right hip pain.  No known injury. EXAM: CT OF THE RIGHT HIP WITHOUT CONTRAST TECHNIQUE: Multidetector CT imaging of the right hip was performed according to the standard protocol. Multiplanar CT image reconstructions were also generated. COMPARISON:  Right hip x-rays from same day. FINDINGS: Bones/Joint/Cartilage Acute, minimally impacted fracture of the right femoral neck. No dislocation. The right hip joint space is preserved. No significant joint effusion. Ligaments Suboptimally assessed by CT. Muscles and  Tendons Intact. Soft tissues Unremarkable. IMPRESSION: 1. Acute, minimally impacted fracture of the right femoral neck. Electronically Signed   By: Obie Dredge M.D.   On: 11/16/2018 17:23   US Venous Img Lower Bilateral  Result Date: 11/16/2018 CLINICAL DATA:  Leg swelling.  History of back surgery. EXAM: BILATERAL LOWER EXTREMITY VENOUS DOPPLER ULTRASOUND TECHNIQUE: Gray-scale sonography with graded compression, as well as color Doppler and duplex ultrasound were performed to evaluate the lower extremity deep venous systems from the level of the common femoral vein and including the common femoral, femoral, profunda femoral, popliteal and calf veins including the posterior tibial, peroneal and gastrocnemius veins when visible. The superficial great saphenous vein was also interrogated. Spectral Doppler was utilized to evaluate flow at rest and with distal augmentation maneuvers in the common femoral, femoral and popliteal veins. COMPARISON:  None. FINDINGS: RIGHT LOWER EXTREMITY Common Femoral Vein: No evidence of thrombus. Normal compressibility, respiratory phasicity and response to augmentation. Saphenofemoral Junction: No evidence of thrombus. Normal compressibility and flow on color Doppler imaging. Profunda Femoral Vein: No evidence of thrombus. Normal compressibility and flow on color Doppler imaging. Femoral Vein: No evidence of thrombus. Normal compressibility, respiratory phasicity and response to augmentation. Popliteal Vein: No evidence of thrombus. Normal compressibility, respiratory phasicity and response to augmentation. Calf Veins: No evidence of thrombus. Normal compressibility and flow on color Doppler imaging. Other Findings:  None. LEFT LOWER EXTREMITY Common Femoral Vein: No evidence of thrombus. Normal compressibility, respiratory phasicity and response to augmentation. Saphenofemoral Junction: No evidence of thrombus. Normal compressibility and flow on color Doppler imaging. Profunda  Femoral Vein: No evidence of thrombus. Normal compressibility and flow on color Doppler imaging. Femoral Vein: No evidence of thrombus. Normal compressibility, respiratory phasicity and response to augmentation. Popliteal Vein: No evidence of thrombus. Normal compressibility, respiratory phasicity and response to augmentation. Calf Veins: No evidence of thrombus. Normal compressibility and flow on color Doppler imaging. Other Findings:  None. IMPRESSION: No evidence of deep venous thrombosis in the lower extremities. Electronically Signed   By: Richarda Overlie M.D.   On: 11/16/2018 15:15   Dg Chest Portable 1 View  Result Date: 11/16/2018 CLINICAL DATA:  Shortness of breath since yesterday. History of myocardial infarction, emphysema, LEFT lobectomy. EXAM: PORTABLE CHEST 1 VIEW COMPARISON:  Chest radiograph October 03, 2018 FINDINGS: Cardiomediastinal silhouette is normal. No pleural effusions or focal consolidations. Surgical clips LEFT upper lung zone. Biapical pleural thickening. Trachea projects midline and there is no pneumothorax. Soft tissue planes and included osseous structures are non-suspicious. New cervicothoracic spinal hardware. IMPRESSION: 1. No acute cardiopulmonary process. 2. Postsurgical changes LEFT upper lung zone. Electronically Signed   By: Awilda Metro M.D.   On: 11/16/2018 13:43   Dg Hip Unilat W Or Wo Pelvis 2-3 Views Right  Result Date: 11/16/2018 CLINICAL DATA:  Hip pain EXAM: DG HIP (WITH OR WITHOUT PELVIS) 2-3V RIGHT COMPARISON:  None. FINDINGS: There is cortical irregularity noted in the femoral neck region. Concern for possible nondisplaced femoral neck fracture. No subluxation or dislocation. IMPRESSION: Concern for subtle nondisplaced right femoral neck fracture. This could be confirmed with CT. Electronically Signed   By: Charlett Nose M.D.   On: 11/16/2018 16:00   ASSESSMENT AND PLAN:   53 year old female with chronic hypoxic respiratory failure  due to COPD on 2-1/2 L of  oxygen at home and chronic diastolic heart failure presented to the emergency room due to shortness of breath and hip pain.  1. nondisplaced right femoral neck fracture: Pathological likely due to chronic steroid use Patient is at moderate risk for moderate risk procedure may proceed without further cardiac intervention. Plan to go to the OR today per Ortho Dr Signa Kell N.p.o. after midnight DVT prophylaxis as per orthopedic surgery  2.  Acute COPD exacerbation: recieved IV steroids (patient is on chronic steroids at home)--change to po home dose steroids Continue nebs and inhalers, incentive spirometer Doxycycline for acute bronchitis  3.  Essential hypertension: Continue diltiazem  4.  Chronic diastolic heart failure without signs of exacerbation Monitor intake and output with daily weight Continue Lasix  5.  Recent back surgery with pain: Continue morphine and oxycodone  6.  Hyperlipidemia: Continue statin  No family in the room CODE STATUS: full  DVT Prophylaxis: start per Ortho after surgery  TOTAL TIME TAKING CARE OF THIS PATIENT: *30 minutes.  >50% time spent on counselling and coordination of care  POSSIBLE D/C IN **2-3 DAYS, DEPENDING ON CLINICAL CONDITION.  Note: This dictation was prepared with Dragon dictation along with smaller phrase technology. Any transcriptional errors that result from this process are unintentional.  Enedina Finner M.D on 11/17/2018 at 7:50 AM  Between 7am to 6pm - Pager - [phone]  After 6pm go to [email or web address] - Social research officer, government  [facility]  Office  [phone]  CC: Primary care physician; Corky Downs, MDPatient ID: Caitlyn Evans, female   DOB: [birth year], 53 y.o.   MRN: [id number]

## 2018-11-17 NOTE — Anesthesia Preprocedure Evaluation (Signed)
Anesthesia Evaluation  Patient identified by MRN, date of birth, ID band Patient awake    Reviewed: Allergy & Precautions, H&P , NPO status , Patient's Chart, lab work & pertinent test results  History of Anesthesia Complications Negative for: history of anesthetic complications  Airway Mallampati: III  TM Distance: <3 FB Neck ROM: limited    Dental  (+) Chipped, Poor Dentition   Pulmonary shortness of breath, asthma , pneumonia, COPD,  COPD inhaler and oxygen dependent, former smoker,           Cardiovascular Exercise Tolerance: Good hypertension, (-) angina+ Past MI and +CHF       Neuro/Psych  Headaches, PSYCHIATRIC DISORDERS    GI/Hepatic Neg liver ROS, hiatal hernia, PUD, GERD  Medicated and Controlled,  Endo/Other  negative endocrine ROS  Renal/GU      Musculoskeletal   Abdominal   Peds  Hematology negative hematology ROS (+)   Anesthesia Other Findings Past Medical History: No date: Anxiety     Comment:  panic attacks No date: Asthma No date: BOOP (bronchiolitis obliterans with organizing pneumonia)  (HCC) No date: CHF (congestive heart failure) (HCC) 05/30/2015: Dysphagia, pharyngoesophageal phase 05/30/2015: Emphysema lung (HCC)     Comment:  on steroids and nebulizer for copd issues.  09/15/18 No date: GERD (gastroesophageal reflux disease) No date: Hypercholesterolemia No date: Hypertension No date: Lung mass No date: Migraines No date: Motion sickness     Comment:  all moving vehicles 2012: Myocardial infarction (HCC) 08/2018: Respiratory failure with hypercapnia (HCC)     Comment:  was hospitalized and on bipap. steroids and nebulizer               treatments helped No date: Seasonal allergies No date: Shortness of breath dyspnea     Comment:  1 flight-stairs No date: Sternal fracture  Past Surgical History: 10/2018: BACK SURGERY     Comment:  spinal fusion at [facility] 06/02/2015: COLONOSCOPY  WITH PROPOFOL; N/A     Comment:  Procedure: COLONOSCOPY WITH PROPOFOL;  Surgeon: Midge Minium, MD;  Location: [facility];  Service:               Endoscopy;  Laterality: N/A; 06/02/2015: ESOPHAGOGASTRODUODENOSCOPY (EGD) WITH PROPOFOL; N/A     Comment:  Procedure: ESOPHAGOGASTRODUODENOSCOPY (EGD) WITH               PROPOFOL withdialation;  Surgeon: Midge Minium, MD;                Location: [facility];  Service: Endoscopy;                Laterality: N/A; 11/05/2017: ESOPHAGOGASTRODUODENOSCOPY (EGD) WITH PROPOFOL; N/A     Comment:  Procedure: ESOPHAGOGASTRODUODENOSCOPY (EGD) WITH               PROPOFOL;  Surgeon: Pasty Spillers, MD;  Location:               [facility] ENDOSCOPY;  Service: Endoscopy;  Laterality: N/A; 09/17/2018: KYPHOPLASTY; N/A     Comment:  Procedure: KYPHOPLASTY T6;  Surgeon: Kennedy Bucker, MD;               Location: [facility] ORS;  Service: Orthopedics;  Laterality:               N/A; No date: LUNG SURGERY; Left     Comment:  Upper lobe removed No date: OOPHORECTOMY; Left No  date: VAGINAL HYSTERECTOMY  BMI    Body Mass Index:  27.17 kg/m      Reproductive/Obstetrics negative OB ROS                             Anesthesia Physical Anesthesia Plan  ASA: IV  Anesthesia Plan: General ETT   Post-op Pain Management:    Induction: Intravenous  PONV Risk Score and Plan: Ondansetron, Dexamethasone, Midazolam and Treatment may vary due to age or medical condition  Airway Management Planned: Oral ETT  Additional Equipment:   Intra-op Plan:   Post-operative Plan: Extubation in OR and Possible Post-op intubation/ventilation  Informed Consent: I have reviewed the patients History and Physical, chart, labs and discussed the procedure including the risks, benefits and alternatives for the proposed anesthesia with the patient or authorized representative who has indicated his/her understanding and acceptance.      Dental Advisory Given  Plan Discussed with: Anesthesiologist, CRNA and Surgeon  Anesthesia Plan Comments: (Patient refuses spinal  Patient consented for risks of anesthesia including but not limited to:  - adverse reactions to medications - damage to teeth, lips or other oral mucosa - sore throat or hoarseness - Damage to heart, brain, lungs or loss of life  Patient voiced understanding.)        Anesthesia Quick Evaluation

## 2018-11-17 NOTE — Care Management (Signed)
Received Email from [place] with [facility] that patient is already open with [facility] for Nursing, PT, OT and SW

## 2018-11-17 NOTE — Care Management Note (Signed)
Case Management Note  Patient Details  Name: Caitlyn Evans MRN: [id number] Date of Birth: 1965/10/19  Subjective/Objective:                  Met with the patient to discuss DC plan and needs The patient stressed using [facility] and would like to continue with them She has WC and RW at home but needs a 3 in 1 I notified Lennette Bihari with [facility] of the need, he will bring out The patient will have the Daughter and Rozanna Boer daughter to take turns coming in to help. Will have [place] set up for Nursing , Pt, OT, Aide and SW,  Is able to get medications at [facility] or [facility] both on [address]. Has a strong family support at home     Action/Plan: [facility] for [facility], 3 in 1 set up with [facility]  Expected Discharge Date:                  Expected Discharge Plan:     In-House Referral:     Discharge planning Services  CM Consult  Post Acute Care Choice:  Home Health Choice offered to:  Patient  DME Arranged:    DME Agency:     HH Arranged:  RN, PT, OT, Nurse's Aide, Social Work [employer] Agency:  [place] ([place])  Status of Service:  In process, will continue to follow  If discussed at Long Length of Stay Meetings, dates discussed:    Additional Comments:  Su Hilt, RN 11/17/2018, 11:59 AM

## 2018-11-17 NOTE — H&P (Signed)
H&P reviewed. No significant changes noted.  

## 2018-11-17 NOTE — Clinical Social Work Note (Signed)
Clinical Social Work Assessment  Patient Details  Name: Caitlyn Evans MRN: [id number] Date of Birth: October 29, 1965  Date of referral:  11/17/18               Reason for consult:  Facility Placement                Permission sought to share information with:  Chartered certified accountant granted to share information::  Yes, Verbal Permission Granted  Name::      [place]::   [place]   Relationship::     Contact Information:     Housing/Transportation Living arrangements for the past 2 months:  [place], [place] of Information:  Patient, Spouse Patient Interpreter Needed:  None Criminal Activity/Legal Involvement Pertinent to Current Situation/Hospitalization:  No - Comment as needed Significant Relationships:  Adult Children, Spouse Lives with:  Spouse Do you feel safe going back to the place where you live?  Yes Need for family participation in patient care:  Yes (Comment)  Care giving concerns:  Patient lives in [place] with her husband Caitlyn Evans.    Social Worker assessment / plan:  Holiday representative ([place]) reviewed chart and noted that patient has a hip fracture. Per RN patient will have surgery today. CSW met with patient and her husband Caitlyn Evans was at bedside. Patient was alert and oriented X4 and was laying in the bed. CSW introduced self and explained role of CSW department. Per patient she was recently at [facility] after she [facility] in January 2020. CSW explained that PT will evaluate patient after surgery and make a recommendation of home health or SNF. CSW explained that [facility] will have to approve SNF again. Per Otila Kluver [facility] liaison they can accept patient however she is in her co-pay days, which will be $160 per day. Per Otila Kluver they can work on a payment plan for patient and she will have to pay $560 to [facility] weekly. CSW made patient and her husband Caitlyn Evans aware of above. Per patient and husband  they can't afford the SNF co-pays. Per patient she will D/C home and wants [facility] for home health. Per husband he works during the day and patient's granddaughter can check on patient occasionally during the day. Patient reported that [place] was working on a NIV at home however she does not have one. Plan is for patient to D/C home from [facility] when medically stable. RN case manager aware of above. CSW will continue to follow and assist as needed.      Employment status:  Disabled (Comment on whether or not currently receiving Disability) Insurance information:  Managed Medicare PT Recommendations:  Not assessed at this time Information / Referral to community resources:  [place]  Patient/Family's Response to care:  Per patient and her husband Caitlyn Evans they can't afford the co-pays at [facility] and will D/C home.   Patient/Family's Understanding of and Emotional Response to Diagnosis, Current Treatment, and Prognosis:  Patient and her husband were very pleasant and thanked CSW for assistance.   Emotional Assessment Appearance:  Appears stated age Attitude/Demeanor/Rapport:    Affect (typically observed):  Accepting, Adaptable, Pleasant Orientation:  Oriented to Self, Oriented to Place, Oriented to  Time, Oriented to Situation Alcohol / Substance use:  Not Applicable Psych involvement (Current and /or in the community):  No (Comment)  Discharge Needs  Concerns to be addressed:  Discharge Planning Concerns Readmission within the last  30 days:  No Current discharge risk:  Dependent with Mobility, Chronically ill Barriers to Discharge:  Continued Medical Work up   [employer], Veronia Beets, LCSW 11/17/2018, 12:05 PM

## 2018-11-17 NOTE — Transfer of Care (Signed)
Immediate Anesthesia Transfer of Care Note  Patient: Caitlyn Evans  Procedure(s) Performed: CANNULATED HIP PINNING (Right )  Patient Location: PACU  Anesthesia Type:General  Level of Consciousness: awake, alert  and oriented  Airway & Oxygen Therapy: Patient Spontanous Breathing and Patient connected to face mask oxygen  Post-op Assessment: Report given to RN and Post -op Vital signs reviewed and stable  Post vital signs: Reviewed and stable  Last Vitals:  Vitals Value Taken Time  BP 160/83 11/17/2018  2:57 PM  Temp    Pulse 80 11/17/2018  2:57 PM  Resp 15 11/17/2018  2:57 PM  SpO2 100 % 11/17/2018  2:57 PM  Vitals shown include unvalidated device data.  Last Pain:  Vitals:   11/17/18 1241  TempSrc: Tympanic  PainSc: 9       Patients Stated Pain Goal: 1 (11/17/18 2595)  Complications: No apparent anesthesia complications

## 2018-11-17 NOTE — Anesthesia Post-op Follow-up Note (Signed)
Anesthesia QCDR form completed.        

## 2018-11-17 NOTE — Consult Note (Addendum)
ORTHOPAEDIC CONSULTATION  REQUESTING PHYSICIAN: Enedina FinnerPatel, Sona, MD  Chief Complaint:   R hip pain  History of Present Illness: Caitlyn Evans is a 53 y.o. female who had a twisting incident to her R hip ~2-3 days ago while at home.  The patient noted immediate hip pain and inability to ambulate.  The patient ambulates with a walker at baseline due to recent thoracic spine surgery.  Pain is described as sharp at its worst and a dull ache at its best.  Pain is rated a 10 out of 10 in severity.  Pain is improved with rest and immobilization.  Pain is worse with any sort of movement.  X-rays in the emergency department show a right femoral neck fracture fracture that is mildly displaced.  Past Medical History:  Diagnosis Date  . Anxiety    panic attacks  . Asthma   . BOOP (bronchiolitis obliterans with organizing pneumonia) (HCC)   . CHF (congestive heart failure) (HCC)   . Dysphagia, pharyngoesophageal phase 05/30/2015  . Emphysema lung (HCC) 05/30/2015   on steroids and nebulizer for copd issues.  09/15/18  . GERD (gastroesophageal reflux disease)   . Hypercholesterolemia   . Hypertension   . Lung mass   . Migraines   . Motion sickness    all moving vehicles  . Myocardial infarction (HCC) 2012  . Respiratory failure with hypercapnia (HCC) 08/2018   was hospitalized and on bipap. steroids and nebulizer treatments helped  . Seasonal allergies   . Shortness of breath dyspnea    1 flight-stairs  . Sternal fracture    Past Surgical History:  Procedure Laterality Date  . BACK SURGERY  10/2018   spinal fusion at [facility]  . COLONOSCOPY WITH PROPOFOL N/A 06/02/2015   Procedure: COLONOSCOPY WITH PROPOFOL;  Surgeon: Midge Miniumarren Wohl, MD;  [facility];  Service: Endoscopy;  Laterality: N/A;  . ESOPHAGOGASTRODUODENOSCOPY (EGD) WITH PROPOFOL N/A 06/02/2015   Procedure: ESOPHAGOGASTRODUODENOSCOPY (EGD) WITH PROPOFOL  withdialation;  Surgeon: Midge Miniumarren Wohl, MD;  [facility];  Service: Endoscopy;  Laterality: N/A;  . ESOPHAGOGASTRODUODENOSCOPY (EGD) WITH PROPOFOL N/A 11/05/2017   Procedure: ESOPHAGOGASTRODUODENOSCOPY (EGD) WITH PROPOFOL;  Surgeon: Pasty Spillersahiliani, Varnita B, MD;  Location: [facility];  Service: Endoscopy;  Laterality: N/A;  . KYPHOPLASTY N/A 09/17/2018   Procedure: KYPHOPLASTY T6;  Surgeon: Kennedy BuckerMenz, Michael, MD;  Location: [facility];  Service: Orthopedics;  Laterality: N/A;  . LUNG SURGERY Left    Upper lobe removed  . OOPHORECTOMY Left   . VAGINAL HYSTERECTOMY     Social History   Socioeconomic History  . Marital status: Married    Spouse name: thomas  . Number of children: Not on file  . Years of education: Not on file  . Highest education level: Not on file  Occupational History    Comment: disabled  Social Needs  . Financial resource strain: Not on file  . Food insecurity:    Worry: Not on file    Inability: Not on file  . Transportation needs:    Medical: Not on file    Non-medical: Not on file  Tobacco Use  . Smoking status: Former Smoker    Packs/day: 1.00    Years: 30.00    Pack years: 30.00    Types: Cigarettes  . Smokeless tobacco: Never Used  . Tobacco comment: quit some time in 2012  Substance and Sexual Activity  . Alcohol use: No    Alcohol/week: 0.0 standard drinks    Comment: rare consumption  .  Drug use: Yes    Types: Marijuana  . Sexual activity: Not Currently    Partners: Male  Lifestyle  . Physical activity:    Days per week: Not on file    Minutes per session: Not on file  . Stress: Not on file  Relationships  . Social connections:    Talks on phone: Not on file    Gets together: Not on file    Attends religious service: Not on file    Active member of club or organization: Not on file    Attends meetings of clubs or organizations: Not on file    Relationship status: Not on file  Other Topics Concern  . Not on file  Social  History Narrative  . Not on file   Family History  Problem Relation Age of Onset  . COPD Mother   . Heart attack Mother   . Stroke Father   . COPD Father    Allergies  Allergen Reactions  . Codeine Hives, Nausea And Vomiting and Nausea Only  . Penicillins Hives and Other (See Comments)    Has patient had a PCN reaction causing immediate rash, facial/tongue/throat swelling, SOB or lightheadedness with hypotension: No Has patient had a PCN reaction causing severe rash involving mucus membranes or skin necrosis: No Has patient had a PCN reaction that required hospitalization No Has patient had a PCN reaction occurring within the last 10 years: No If all of the above answers are "NO", then may proceed with Cephalosporin use. Other reaction(s): Other (See Comments) Has patient had a PCN   Prior to Admission medications   Medication Sig Start Date End Date Taking? Authorizing Provider  acetaminophen (TYLENOL) 650 MG CR tablet Take 650 mg by mouth every 4 (four) hours as needed for pain.   Yes [provider]  ALPRAZolam Prudy Feeler) 0.5 MG tablet Take 0.5 mg by mouth 3 (three) times daily. 06/10/18  Yes [provider]  Ascorbic Acid (VITAMIN C) 1000 MG tablet Take 1,000 mg by mouth daily.   Yes [provider]  diltiazem (CARDIZEM LA) 120 MG 24 hr tablet Take 120 mg by mouth daily. 11/26/17  Yes [provider]  docusate sodium (COLACE) 100 MG capsule Take 1 capsule (100 mg total) by mouth 2 (two) times daily. 10/05/18  Yes Enid Baas, MD  donepezil (ARICEPT) 10 MG tablet Take 10 mg by mouth daily.    Yes [provider]  ipratropium-albuterol (DUONEB) 0.5-2.5 (3) MG/3ML SOLN Inhale 3 mLs into the lungs every 4 (four) hours as needed (for wheezing/shortness of breath). Patient taking differently: Inhale 3 mLs into the lungs every 4 (four) hours as needed. For wheezing/shortness of breath 05/13/16  Yes Enid Baas, MD  lactulose (CHRONULAC)  10 GM/15ML solution Take 30 mLs (20 g total) by mouth 2 (two) times daily as needed for mild constipation. 10/05/18  Yes Enid Baas, MD  lidocaine (LIDODERM) 5 % Place 1 patch onto the skin daily. Remove & Discard patch within 12 hours or as directed by MD 10/05/18  Yes Enid Baas, MD  methocarbamol (ROBAXIN) 750 MG tablet Take 750 mg by mouth every 8 (eight) hours as needed for muscle spasms.   Yes [provider]  metoprolol tartrate (LOPRESSOR) 25 MG tablet Take 1 tablet (25 mg total) by mouth 2 (two) times daily. 11/25/17  Yes Enid Baas, MD  morphine (MS CONTIN) 15 MG 12 hr tablet Take 15 mg by mouth every 12 (twelve) hours.   Yes [provider]  naloxone (NARCAN) nasal spray 4 mg/0.1 mL Place 1 spray into the nose once. 11/10/18  Yes [provider]  OxyCODONE HCl, Abuse Deter, (OXAYDO) 5 MG TABA Take 5 mg by mouth 4 (four) times daily. 11/10/18 11/24/18 Yes [provider]  pantoprazole (PROTONIX) 40 MG tablet TAKE ONE TABLET BY MOUTH TWICE DAILY Patient taking differently: Take 40 mg by mouth 2 (two) times daily.  04/03/18  Yes Melodie Bouillon B, MD  polyethylene glycol (MIRALAX / GLYCOLAX) packet Take 17 g by mouth daily. 10/05/18  Yes Enid Baas, MD  predniSONE (DELTASONE) 10 MG tablet Take 10 mg by mouth 2 (two) times daily with a meal.   Yes [provider]  pregabalin (LYRICA) 100 MG capsule Take 100 mg by mouth 2 (two) times daily. 11/10/18 11/10/19 Yes [provider]  rosuvastatin (CRESTOR) 10 MG tablet Take 10 mg by mouth daily.  10/10/17  Yes [provider]  senna-docusate (SENOKOT-S) 8.6-50 MG tablet Take 2 tablets by mouth 2 (two) times daily. 10/16/18 10/16/19 Yes [provider]  doxycycline (VIBRAMYCIN) 100 MG capsule Take 1 capsule (100 mg total) by mouth 2 (two) times daily for 7 days. 11/16/18 11/23/18  Nita Sickle, MD  furosemide (LASIX) 20 MG tablet Take 1 tablet (20 mg total)  by mouth daily. Patient not taking: Reported on 11/16/2018 11/26/17   Enid Baas, MD  guaiFENesin (ROBITUSSIN) 100 MG/5ML SOLN Take 5 mLs (100 mg total) by mouth every 4 (four) hours as needed for cough or to loosen phlegm. 09/26/18   Shaune Pollack, MD  predniSONE (DELTASONE) 20 MG tablet Take 3 tablets (60 mg total) by mouth daily for 4 days. 11/16/18 11/20/18  Nita Sickle, MD  tamsulosin (FLOMAX) 0.4 MG CAPS capsule Take 1 capsule (0.4 mg total) by mouth daily. Patient not taking: Reported on 11/16/2018 11/26/17   Enid Baas, MD   Recent Labs    11/16/18 1320 11/16/18 1851 11/17/18 0317  WBC 13.2*  --  10.8*  HGB 13.5  --  12.9  HCT 45.6  --  42.2  PLT 296  --  298  K 3.2*  --  3.8  CL 103  --  100  CO2 32  --  29  BUN 17  --  13  CREATININE 0.43*  --  0.41*  GLUCOSE 91  --  137*  CALCIUM 8.8*  --  9.0  INR  --  1.0  --    Ct Hip Right Wo Contrast  Result Date: 11/16/2018 CLINICAL DATA:  Severe right hip pain.  No known injury. EXAM: CT OF THE RIGHT HIP WITHOUT CONTRAST TECHNIQUE: Multidetector CT imaging of the right hip was performed according to the standard protocol. Multiplanar CT image reconstructions were also generated. COMPARISON:  Right hip x-rays from same day. FINDINGS: Bones/Joint/Cartilage Acute, minimally impacted fracture of the right femoral neck. No dislocation. The right hip joint space is preserved. No significant joint effusion. Ligaments Suboptimally assessed by CT. Muscles and Tendons Intact. Soft tissues Unremarkable. IMPRESSION: 1. Acute, minimally impacted fracture of the right femoral neck. Electronically Signed   By: Obie Dredge M.D.   On: 11/16/2018 17:23   US Venous Img Lower Bilateral  Result Date: 11/16/2018 CLINICAL DATA:  Leg swelling.  History of back surgery. EXAM: BILATERAL LOWER EXTREMITY VENOUS DOPPLER ULTRASOUND TECHNIQUE: Gray-scale sonography with graded compression, as well as color Doppler and duplex ultrasound were performed to  evaluate the lower extremity deep venous systems from the level of  the common femoral vein and including the common femoral, femoral, profunda femoral, popliteal and calf veins including the posterior tibial, peroneal and gastrocnemius veins when visible. The superficial great saphenous vein was also interrogated. Spectral Doppler was utilized to evaluate flow at rest and with distal augmentation maneuvers in the common femoral, femoral and popliteal veins. COMPARISON:  None. FINDINGS: RIGHT LOWER EXTREMITY Common Femoral Vein: No evidence of thrombus. Normal compressibility, respiratory phasicity and response to augmentation. Saphenofemoral Junction: No evidence of thrombus. Normal compressibility and flow on color Doppler imaging. Profunda Femoral Vein: No evidence of thrombus. Normal compressibility and flow on color Doppler imaging. Femoral Vein: No evidence of thrombus. Normal compressibility, respiratory phasicity and response to augmentation. Popliteal Vein: No evidence of thrombus. Normal compressibility, respiratory phasicity and response to augmentation. Calf Veins: No evidence of thrombus. Normal compressibility and flow on color Doppler imaging. Other Findings:  None. LEFT LOWER EXTREMITY Common Femoral Vein: No evidence of thrombus. Normal compressibility, respiratory phasicity and response to augmentation. Saphenofemoral Junction: No evidence of thrombus. Normal compressibility and flow on color Doppler imaging. Profunda Femoral Vein: No evidence of thrombus. Normal compressibility and flow on color Doppler imaging. Femoral Vein: No evidence of thrombus. Normal compressibility, respiratory phasicity and response to augmentation. Popliteal Vein: No evidence of thrombus. Normal compressibility, respiratory phasicity and response to augmentation. Calf Veins: No evidence of thrombus. Normal compressibility and flow on color Doppler imaging. Other Findings:  None. IMPRESSION: No evidence of deep venous  thrombosis in the lower extremities. Electronically Signed   By: Richarda Overlie M.D.   On: 11/16/2018 15:15   Dg Chest Portable 1 View  Result Date: 11/16/2018 CLINICAL DATA:  Shortness of breath since yesterday. History of myocardial infarction, emphysema, LEFT lobectomy. EXAM: PORTABLE CHEST 1 VIEW COMPARISON:  Chest radiograph October 03, 2018 FINDINGS: Cardiomediastinal silhouette is normal. No pleural effusions or focal consolidations. Surgical clips LEFT upper lung zone. Biapical pleural thickening. Trachea projects midline and there is no pneumothorax. Soft tissue planes and included osseous structures are non-suspicious. New cervicothoracic spinal hardware. IMPRESSION: 1. No acute cardiopulmonary process. 2. Postsurgical changes LEFT upper lung zone. Electronically Signed   By: Awilda Metro M.D.   On: 11/16/2018 13:43   Dg Hip Unilat W Or Wo Pelvis 2-3 Views Right  Result Date: 11/16/2018 CLINICAL DATA:  Hip pain EXAM: DG HIP (WITH OR WITHOUT PELVIS) 2-3V RIGHT COMPARISON:  None. FINDINGS: There is cortical irregularity noted in the femoral neck region. Concern for possible nondisplaced femoral neck fracture. No subluxation or dislocation. IMPRESSION: Concern for subtle nondisplaced right femoral neck fracture. This could be confirmed with CT. Electronically Signed   By: Charlett Nose M.D.   On: 11/16/2018 16:00     Positive ROS: All other systems have been reviewed and were otherwise negative with the exception of those mentioned in the HPI and as above.  Physical Exam: BP (!) 150/74   Pulse 82   Temp (!) 97.3 F (36.3 C) (Tympanic)   Resp 16   Ht 4\' 10"  (1.473 m)   Wt 59 kg   SpO2 98%   BMI 27.17 kg/m  General:  Alert, no acute distress Psychiatric:  Patient is competent for consent with normal mood and affect   Cardiovascular:  No pedal edema, regular rate and rhythm Respiratory:  No wheezing, non-labored breathing GI:  Abdomen is soft and non-tender Skin:  No lesions in the  area of chief complaint, no erythema. Significant skin lesions related to dog bites and scratching extremities  x 4 extremities Neurologic:  Sensation intact distally, CN grossly intact Lymphatic:  No axillary or cervical lymphadenopathy  Orthopedic Exam:  RLE: 5/5 DF/PF/EHL SILT grossly in s/s/t/sp/dp distr Foot wwp +Log roll/axial load   X-rays:  As above: R femoral neck fracture that is mildly displaced into slight varus  Assessment/Plan: Caitlyn Evans is a 53 y.o. female with a R R femoral neck fracture that is mildly displaced into slight varus   1. I discussed the various treatment options including both surgical and non-surgical management of her fracture with the patient. We discussed the high risk of perioperative complications due to patient's age and other co-morbidities. After discussion of risks, benefits, and alternatives to surgery, the family and patient were in agreement to proceed with surgery. We also discussed that given the nature of the fracture, there was a chance of nonunion/malunion, which could result in future conversion to arthroplasty. The goals of surgery would be to provide adequate pain relief and allow for mobilization. Plan for surgery is R hip percutaneous pinning today 2. NPO  3. Hold anticoagulation in advance of OR        Signa Kell   11/17/2018 12:56 PM

## 2018-11-17 NOTE — NC FL2 (Signed)
[place] MEDICAID FL2 LEVEL OF CARE SCREENING TOOL     IDENTIFICATION  Patient Name: Caitlyn Evans Birthdate: [birth year] Sex: female Admission Date (Current Location): 11/16/2018  [place] and [place] Number:  Chiropodist and Address:  [facility], [address], [place], [place] [address]      Provider Number: [id number]  Attending Physician Name and Address:  Enedina Finner, MD  Relative Name and Phone Number:       Current Level of Care: Hospital Recommended Level of Care: Skilled Nursing Facility Prior Approval Number:    Date Approved/Denied:   PASRR Number: [id number])  Discharge Plan: SNF    Current Diagnoses: Patient Active Problem List   Diagnosis Date Noted  . Hip fracture (HCC) 11/16/2018  . Acute on chronic respiratory failure with hypoxia (HCC) 09/09/2018  . Esophagitis, unspecified   . Stomach irritation   . Hiatal hernia   . COPD (chronic obstructive pulmonary disease) (HCC) 07/19/2016  . Acute on chronic respiratory failure (HCC) 05/09/2016  . Noncompliance with medication regimen 05/09/2016  . COPD with acute exacerbation (HCC) 02/05/2016  . Acute exacerbation of chronic obstructive pulmonary disease (COPD) (HCC)   . Acute respiratory failure (HCC)   . Acute respiratory failure with hypoxia (HCC) 12/05/2015  . BOOP (bronchiolitis obliterans with organizing pneumonia) (HCC) 12/05/2015  . Depression, major, single episode, moderate (HCC) 07/14/2015  . Adjustment disorder with anxiety 07/14/2015  . COPD exacerbation (HCC) 07/14/2015  . Special screening for malignant neoplasms, colon   . Swallowing difficulty   . Loss of weight   . Esophagogastric ulcer   . Feline esophagus   . Hypertension 05/30/2015  . Emphysema lung (HCC) 05/30/2015  . Dysphagia, pharyngoesophageal phase 05/30/2015    Orientation RESPIRATION BLADDER Height & Weight     Self, Situation, Time, Place  O2(2 Liters Oxygen. ) Continent Weight:  130 lb (59 kg) Height:  4\' 10"  (147.3 cm)  BEHAVIORAL SYMPTOMS/MOOD NEUROLOGICAL BOWEL NUTRITION STATUS      Continent Diet(Diet: NPO for surgery to be advanced. )  AMBULATORY STATUS COMMUNICATION OF NEEDS Skin   Extensive Assist Verbally Surgical wounds                       Personal Care Assistance Level of Assistance  Bathing, Feeding, Dressing Bathing Assistance: Limited assistance Feeding assistance: Independent Dressing Assistance: Limited assistance     Functional Limitations Info  Sight, Hearing, Speech Sight Info: Adequate Hearing Info: Adequate Speech Info: Adequate    SPECIAL CARE FACTORS FREQUENCY  PT (By licensed PT), OT (By licensed OT)     PT Frequency: (5) OT Frequency: (5)            Contractures      Additional Factors Info  Code Status, Allergies Code Status Info: (Full Code. ) Allergies Info: (Codeine, Penicillins)           Current Medications (11/17/2018):  This is the current hospital active medication list Current Facility-Administered Medications  Medication Dose Route Frequency Provider Last Rate Last Dose  . acetaminophen (TYLENOL) tablet 650 mg  650 mg Oral Q6H PRN Adrian Saran, MD       Or  . acetaminophen (TYLENOL) suppository 650 mg  650 mg Rectal Q6H PRN Adrian Saran, MD      . ALPRAZolam Prudy Feeler) tablet 0.5 mg  0.5 mg Oral TID Adrian Saran, MD   0.5 mg at 11/17/18 0000  . bisacodyl (DULCOLAX) suppository 10 mg  10 mg  Rectal Daily PRN Adrian Saran, MD      . ceFAZolin (ANCEF) IVPB 1 g/50 mL premix  1 g Intravenous Once Signa Kell, MD      . diltiazem (CARDIZEM CD) 24 hr capsule 120 mg  120 mg Oral Daily Mody, Sital, MD      . docusate sodium (COLACE) capsule 100 mg  100 mg Oral BID Mody, Sital, MD      . donepezil (ARICEPT) tablet 10 mg  10 mg Oral Daily Mody, Sital, MD      . doxycycline (VIBRA-TABS) tablet 100 mg  100 mg Oral Q12H Mody, Sital, MD   100 mg at 11/16/18 2251  . guaiFENesin (ROBITUSSIN) 100 MG/5ML solution 100 mg   5 mL Oral Q4H PRN Mody, Sital, MD      . ipratropium-albuterol (DUONEB) 0.5-2.5 (3) MG/3ML nebulizer solution 3 mL  3 mL Nebulization Q4H Mody, Sital, MD   3 mL at 11/17/18 0935  . lactulose (CHRONULAC) 10 GM/15ML solution 20 g  20 g Oral BID PRN Adrian Saran, MD      . methocarbamol (ROBAXIN) tablet 750 mg  750 mg Oral Q8H PRN Mody, Sital, MD      . metoprolol tartrate (LOPRESSOR) tablet 25 mg  25 mg Oral BID Adrian Saran, MD   25 mg at 11/17/18 0923  . morphine (MS CONTIN) 12 hr tablet 15 mg  15 mg Oral Q12H Mody, Sital, MD   15 mg at 11/17/18 0923  . naloxone (NARCAN) nasal spray 4 mg/0.1 mL  1 spray Nasal Once Adrian Saran, MD      . ondansetron (ZOFRAN) tablet 4 mg  4 mg Oral Q6H PRN Adrian Saran, MD       Or  . ondansetron (ZOFRAN) injection 4 mg  4 mg Intravenous Q6H PRN Mody, Sital, MD      . oxyCODONE (Oxy IR/ROXICODONE) immediate release tablet 5 mg  5 mg Oral QID Adrian Saran, MD   5 mg at 11/17/18 0923  . oxyCODONE (Oxy IR/ROXICODONE) immediate release tablet 5 mg  5 mg Oral Q4H PRN Montez Morita, MD   5 mg at 11/16/18 2034  . pantoprazole (PROTONIX) EC tablet 40 mg  40 mg Oral BID Adrian Saran, MD   40 mg at 11/17/18 0004  . polyethylene glycol (MIRALAX / GLYCOLAX) packet 17 g  17 g Oral Daily Mody, Sital, MD      . polyethylene glycol (MIRALAX / GLYCOLAX) packet 17 g  17 g Oral Daily PRN Mody, Sital, MD      . predniSONE (DELTASONE) tablet 10 mg  10 mg Oral BID WC Enedina Finner, MD      . pregabalin (LYRICA) capsule 100 mg  100 mg Oral BID Adrian Saran, MD   100 mg at 11/17/18 0000  . rosuvastatin (CRESTOR) tablet 10 mg  10 mg Oral Daily Mody, Sital, MD      . senna-docusate (Senokot-S) tablet 2 tablet  2 tablet Oral BID Mody, Sital, MD      . vitamin C (ASCORBIC ACID) tablet 1,000 mg  1,000 mg Oral Daily Adrian Saran, MD         Discharge Medications: Please see discharge summary for a list of discharge medications.  Relevant Imaging Results:  Relevant Lab Results:   Additional  Information (SSN: [id number])  Morgaine Kimball, Darleen Crocker, LCSW

## 2018-11-17 NOTE — Op Note (Signed)
DATE OF SURGERY: 11/17/2018  PREOPERATIVE DIAGNOSIS: Right femoral neck fracture  POSTOPERATIVE DIAGNOSIS: Right femoral neck fracture  PROCEDURE: Percutaneous pinning of right femoral neck fracture  SURGEON: Rosealee Albee, MD  ASSISTANTS: none  EBL: 10 cc  COMPONENTS:  Stryker 6.50mm cannulated screws x 3 with washers (46mm, 17mm, 39mm)   INDICATIONS: Caitlyn Evans is a 53 y.o. female who sustained an impacted femoral neck fracture after a twisting injury ~2-3 days ago. Risks and benefits of percutaneous pinning were explained to the patient and family. Risks include but are not limited to bleeding, infection, injury to tissues, nerves, vessels, DVT/PE, malunion/nonunion, hardware failure, and risks of anesthesia. We also discussed that she was at increased risk of complication due to almost varus alignment instead of standard valgus impacted position and that this may require conversion to an arthroplasty. The patient and family understand these risks, have completed an informed consent, and wish to proceed.   PROCEDURE:  The patient was brought into the operating room. After administering general anesthesia, the patient was placed in the supine position on the Hana table. The uninjured leg was extended while the injured lower extremity was placed in a neutral position with care taken to not displace the fracture during positioning. The lateral aspects of the operative hip and thigh were prepped with ChloraPrep solution before being draped sterilely. IV antibiotics were administered. A timeout was performed to verify the appropriate surgical site, patient, and procedure.   An approximately 5 cm incision was made over the lateral aspect of the proximal femur. The incision was carried down through the subcutaneous tissues to expose the IT band. This was split at the proximal portion of the incision and the vastus lateralis was split in line with its fibers. The lateral aspect of the femur was  cleared of soft tissue. Under fluoroscopic guidance, a guidewire was placed along the inferior aspect of the femoral neck into the head while ensuring the start point on the lateral cortex was not below the level of the lesser trochanter. A parallel guide was used to place two additional guidepins (superior posterior and superior anterior). Position of all pins was verified fluoroscopically in both the AP and lateral views. A measuring device was used the measure appropriate screw length. The guidepins were drilled with a 3.21mm drill. Appropriately sized screws were advanced starting with the inferior screw, then superior posterior, then superior anterior. Screws were sequentially tightened. Hardware position and bony alignment was confirmed fluoroscopically with AP and lateral views as well as live fluoroscopy.   The wounds were irrigated thoroughly with sterile saline solution. Local anesthetic was injected into the wound. The IT band was closed with 0-Vicryl. Deep fat sutures were also placed with 0-Vicryl. The subcutaneous tissues were closed using 2-0 Vicryl interrupted sutures. The skin was closed using staples. Sterile occlusive dressing was applied. The patient was then transferred to the recovery room in satisfactory condition after tolerating the procedure well.  POSTOPERATIVE PLAN: The patient will be FFWB x 6 weeks on the operative extremity. Lovenox 40mg /day x 4 weeks to start on POD#1. Ancef x 24 hours. PT/OT on POD#1.

## 2018-11-17 NOTE — Clinical Social Work Placement (Signed)
   CLINICAL SOCIAL WORK PLACEMENT  NOTE  Date:  11/17/2018  Patient Details  Name: Caitlyn Evans MRN: [id number] Date of Birth: 11-Oct-1965  Clinical Social Work is seeking post-discharge placement for this patient at the Skilled  Nursing Facility level of care (*CSW will initial, date and re-position this form in  chart as items are completed):  Yes   Patient/family provided with [facility] Department's list of facilities offering this level of care within the geographic area requested by the patient (or if unable, by the patient's family).  Yes   Patient/family informed of their freedom to choose among providers that offer the needed level of care, that participate in Medicare, Medicaid or managed care program needed by the patient, have an available bed and are willing to accept the patient.  Yes   Patient/family informed of [facility]'s ownership interest in [facility] and [facility], as well as of the fact that they are under no obligation to receive care at these facilities.  PASRR submitted to EDS on       PASRR number received on       Existing PASRR number confirmed on 11/17/18     FL2 transmitted to all facilities in geographic area requested by pt/family on 11/17/18     FL2 transmitted to all facilities within larger geographic area on       Patient informed that his/her managed care company has contracts with or will negotiate with certain facilities, including the following:        Yes   Patient/family informed of bed offers received.  Patient chooses bed at       Physician recommends and patient chooses bed at      Patient to be transferred to   on  .  Patient to be transferred to facility by       Patient family notified on   of transfer.  Name of family member notified:        PHYSICIAN       Additional Comment:    _______________________________________________ Sullivan Blasing, Darleen Crocker, LCSW 11/17/2018, 12:04 PM

## 2018-11-18 ENCOUNTER — Encounter: Payer: Self-pay | Admitting: Orthopedic Surgery

## 2018-11-18 LAB — BASIC METABOLIC PANEL
Anion gap: 8 (ref 5–15)
BUN: 20 mg/dL (ref 6–20)
CO2: 27 mmol/L (ref 22–32)
Calcium: 8.8 mg/dL — ABNORMAL LOW (ref 8.9–10.3)
Chloride: 106 mmol/L (ref 98–111)
Creatinine, Ser: 0.46 mg/dL (ref 0.44–1.00)
GFR calc Af Amer: >60 mL/min (ref >60)
Glucose, Bld: 110 mg/dL — ABNORMAL HIGH (ref 70–99)
Potassium: 4 mmol/L (ref 3.5–5.1)
Sodium: 141 mmol/L (ref 135–145)

## 2018-11-18 LAB — CBC
HCT: 37.6 % (ref 36.0–46.0)
Hemoglobin: 11.4 g/dL — ABNORMAL LOW (ref 12.0–15.0)
MCH: 29.2 pg (ref 26.0–34.0)
MCHC: 30.3 g/dL (ref 30.0–36.0)
MCV: 96.2 fL (ref 80.0–100.0)
NRBC: 0 % (ref 0.0–0.2)
Platelets: 268 10*3/uL (ref 150–400)
RBC: 3.91 MIL/uL (ref 3.87–5.11)
RDW: 16.1 % — ABNORMAL HIGH (ref 11.5–15.5)
WBC: 17.9 10*3/uL — AB (ref 4.0–10.5)

## 2018-11-18 MED ORDER — TRAMADOL HCL 50 MG PO TABS: 50.0000 mg | ORAL_TABLET | Freq: Four times a day (QID) | ORAL | 1 refills | Status: DC | PRN

## 2018-11-18 MED ORDER — SENNA 8.6 MG PO TABS
1.0000 | ORAL_TABLET | Freq: Two times a day (BID) | ORAL | Status: DC
  Administered 2018-11-18 – 2018-11-20 (×4): 8.6 mg via ORAL
  Filled 2018-11-18 (×4): qty 1

## 2018-11-18 MED ORDER — OXYCODONE HCL 5 MG PO TABS: 5.0000 mg | ORAL_TABLET | ORAL | 0 refills | Status: DC | PRN

## 2018-11-18 MED ORDER — ENOXAPARIN SODIUM 40 MG/0.4ML ~~LOC~~ SOLN: 40.0000 mg | SUBCUTANEOUS | 0 refills | Status: DC

## 2018-11-18 NOTE — Care Management (Signed)
Obtained information to rent a wheelchair ramp [employer] [phone], [employer] Kennith Gain [phone], [employer] [phone] rent them for $40 weekly, [employer] rents for no less than 6 months at $300 per month. AM ramp number is [phone] I was unable to reach to get a price but left a VM for a call back.  Gave the patient the information for the ramp resources.

## 2018-11-18 NOTE — Evaluation (Signed)
Occupational Therapy Evaluation Patient Details Name: Caitlyn Evans MRN: [id number] DOB: 02-08-1966 Today's Date: 11/18/2018    History of Present Illness From MD H&P: Pt is a 53 y.o. female with a known history of chronic hypoxic respiratory failure on 2-1/2 to 3 L of oxygen at home due to COPD, chronic diastolic heart failure with preserved ejection fraction and hyperlipidemia who presented to the emergency room due to shortness of breath and hip pain.  Pt stated her shortness of breath is improved however she continues to have hip pain.  She denied fall.  She had recent spinal fusion in February at [facility] and since that time she has been ambulating however over the past several days she was having much difficulty ambulating and she could not bear weight on her RLE.  CT of the right hip showed a femoral neck fracture.  Pt now s/p percutaneous pinning of right femoral neck fracture.  Assessment also includes: acute COPD exacerbation, HTN, HLD, chronic CHF, and hypokalemia.     Clinical Impression   Caitlyn Evans was seen for OT evaluation this date, POD#1 from above surgery. Pt was independent in all ADLs prior to back surgery in February. Pt is eager to return to PLOF with less pain and improved safety and independence. Pt currently requires mod assist for LB dressing and bathing while in seated position due to pain and limited AROM of R hip. Pt instructed in self care skills, falls prevention strategies, home/routines modifications, DME/AE for LB bathing and dressing tasks, energy conservation strategies for mgt of COPD, and compression stocking mgt strategies. Pt would benefit from additional instruction in self care skills and techniques to help maintain hip and back precautions with or without assistive devices to support recall and carryover prior to discharge. Recommend STR upon discharge.      Follow Up Recommendations  SNF;Supervision - Intermittent(Supervision OOB)    Equipment  Recommendations  3 in 1 bedside commode;Other (comment);Tub/shower bench(long handle reacher, long handle sponge, leg lifter. )    Recommendations for Other Services       Precautions / Restrictions Precautions Precautions: Fall;Back Precaution Comments: No back precautions listed in chart review (Sx at [facility]) with lumbar corset brace in the room; standard back precautions assumed during session. Required Braces or Orthoses: Spinal Brace Spinal Brace: Lumbar corset Restrictions Weight Bearing Restrictions: Yes RLE Weight Bearing: Touchdown weight bearing Other Position/Activity Restrictions: Flat foot TDWB with weight of the leg only      Mobility Bed Mobility Overal bed mobility: Needs Assistance Bed Mobility: Sit to Sidelying;Sidelying to Sit   Sidelying to sit: Min assist     Sit to sidelying: Min assist General bed mobility comments: Deferred at time of Eval. Pt endorsed 7/10 pain only agreeable to work with OT if, "I can stay right where I am in bed" Per PT pt required min A sidelying<>sit with VCs for log roll technique.   Transfers Overall transfer level: Needs assistance Equipment used: Rolling walker (2 wheeled) Transfers: Sit to/from Stand Sit to Stand: Min guard;From elevated surface         General transfer comment: Deferred. Per PT: pt using RW minguard sit<>stand from elevated surface.     Balance Overall balance assessment: Mild deficits observed, not formally tested                                         ADL  either performed or assessed with clinical judgement   ADL Overall ADL's : Needs assistance/impaired Eating/Feeding: Set up;Independent;Sitting   Grooming: Set up;Sitting;Supervision/safety   Upper Body Bathing: Set up;Sitting;Minimal assistance   Lower Body Bathing: Set up;Sit to/from stand;Moderate assistance;Cueing for back precautions;Adhering to back precautions   Upper Body Dressing : Set  up;Supervision/safety;Sitting   Lower Body Dressing: Set up;Moderate assistance;Cueing for back precautions;Adhering to back precautions;Sit to/from stand   Toilet Transfer: Min Electrical engineer Details (indicate cue type and reason): Pt with TWWB precautions RLE (flat foot) may be able to ambulate to [facility] but is limited by cardiopulmonary status. Would require increased effort/exertion ambulate.  Toileting- Clothing Manipulation and Hygiene: Minimal assistance;Sit to/from stand;Adhering to back precautions               Vision Baseline Vision/History: Wears glasses;Cataracts Wears Glasses: At all times Patient Visual Report: No change from baseline Additional Comments: Pt endorses hx of cataracts. Has not had surgery.      Perception     Praxis      Pertinent Vitals/Pain Pain Assessment: 0-10 Pain Score: 7  Pain Location: RLE Pain Descriptors / Indicators: Aching;Sore;Guarding Pain Intervention(s): Limited activity within patient's tolerance;Monitored during session;Premedicated before session;Utilized relaxation techniques     Hand Dominance Right   Extremity/Trunk Assessment Upper Extremity Assessment Upper Extremity Assessment: Generalized weakness   Lower Extremity Assessment Lower Extremity Assessment: Generalized weakness;RLE deficits/detail;Defer to PT evaluation RLE Deficits / Details:  s/p percutaneous pinning of right femoral neck fracture RLE: Unable to fully assess due to pain       Communication Communication Communication: No difficulties   Cognition Arousal/Alertness: Awake/alert Behavior During Therapy: WFL for tasks assessed/performed Overall Cognitive Status: Within Functional Limits for tasks assessed                                     General Comments       Exercises  Other Exercises: Pt educated in back precautions, log roll technique for bed mobility, AE for LB ADLs, energy conservation strategies,  routines modifications, and falls prevention strategies for improved safety and functional independence in the home.    Shoulder Instructions      Home Living Family/patient expects to be discharged to:: Private residence Living Arrangements: Spouse/significant other;Other relatives(granddaughter) Available Help at Discharge: Family;Available 24 hours/day(combination of spouse, daughter, granddaughter. ) Type of Home: Mobile home Home Access: Stairs to enter Entrance Stairs-Number of Steps: 4 Entrance Stairs-Rails: None Home Layout: One level     Bathroom Shower/Tub: Chief Strategy Officer: Handicapped height(with handles) Bathroom Accessibility: Yes   Home Equipment: Environmental consultant - 2 wheels;Cane - single point;Shower seat;Grab bars - tub/shower;Wheelchair - manual   Additional Comments: pt reports family is going to get a toilet riser      Prior Functioning/Environment Level of Independence: Independent        Comments: Prior to back Sx this past February pt was Ind with amb without an AD community distances, driving, goes to a gym, one fall in the last year, Ind with ADLs        OT Problem List: Decreased strength;Impaired balance (sitting and/or standing);Decreased knowledge of precautions;Pain;Decreased range of motion;Decreased safety awareness;Cardiopulmonary status limiting activity;Decreased activity tolerance;Decreased knowledge of use of DME or AE;Decreased coordination;Impaired UE functional use      OT Treatment/Interventions: Self-care/ADL training;Therapeutic exercise;Therapeutic activities;DME and/or AE instruction;Energy conservation;Patient/family education    OT Goals(Current goals  can be found in the care plan section) Acute Rehab OT Goals Patient Stated Goal: To get stronger and walk better OT Goal Formulation: With patient Time For Goal Achievement: 12/02/18 Potential to Achieve Goals: Good ADL Goals Pt Will Perform Lower Body Bathing: sit  to/from stand;with adaptive equipment;with modified independence(With LRAD/DME for safety and improved functional independence.) Pt Will Perform Lower Body Dressing: with min assist;with adaptive equipment;sit to/from stand(With LRAD/DME for safety and improved functional independence) Pt Will Transfer to Toilet: ambulating;bedside commode;with supervision(With LRAD/DME for safety and improved functional independence) Additional ADL Goal #1: Pt will independently verbalize a plan to implement at least 3 learned energy conservation strategies for improved safety and independence during participation in meaningful occupations of daily life.  OT Frequency: Min 2X/week   Barriers to D/C: Decreased caregiver support;Inaccessible home environment  Pt would benefit from ramped entrance to her home. Currently has 4 steps to enter with no hand rails.        Co-evaluation              AM-PAC OT "6 Clicks" Daily Activity     Outcome Measure Help from another person eating meals?: None Help from another person taking care of personal grooming?: None Help from another person toileting, which includes using toliet, bedpan, or urinal?: A Little Help from another person bathing (including washing, rinsing, drying)?: A Lot Help from another person to put on and taking off regular upper body clothing?: A Little Help from another person to put on and taking off regular lower body clothing?: A Lot 6 Click Score: 18   End of Session    Activity Tolerance: Patient limited by pain Patient left: in bed;with call bell/phone within reach;with SCD's reapplied;with bed alarm set  OT Visit Diagnosis: Other abnormalities of gait and mobility (R26.89);History of falling (Z91.81);Pain Pain - Right/Left: Right Pain - part of body: Hip;Leg(back)                Time: 1610-9604 OT Time Calculation (min): 32 min Charges:  OT General Charges $OT Visit: 1 Visit OT Evaluation $OT Eval Moderate Complexity: 1  Mod OT Treatments $Self Care/Home Management : 23-37 mins  Rockney Ghee, M.S., OTR/L Ascom: [phone] 11/18/18, 4:09 PM

## 2018-11-18 NOTE — Discharge Instructions (Signed)
You were seen today in the Emergency Department (ED) and was diagnosed with a COPD exacerbation. Chronic obstructive pulmonary disease (COPD) is a general term for a group of lung diseases, including emphysema and chronic bronchitis. People with COPD have decreased airflow in and out of the lungs, which makes it hard to breathe. The airways also can get clogged with thick mucus. Cigarette smoking is a major cause of COPD.   Although there is no cure for COPD, you can slow its progress. Following your treatment plan and taking care of yourself can help you feel better and live longer.   Use your albuterol inhaler 2 puffs every 4 hour as needed for shortness of breath, wheezing, or cough. Take steroids as prescribed. Take antibiotics as prescribed.  Follow-up with your doctor in 1 day for re-evaluation.  INSTRUCTIONS AFTER Surgery  o Remove items at home which could result in a fall. This includes throw rugs or furniture in walking pathways o ICE to the affected joint every three hours while awake for 30 minutes at a time, for at least the first 3-5 days, and then as needed for pain and swelling.  Continue to use ice for pain and swelling. You may notice swelling that will progress down to the foot and ankle.  This is normal after surgery.  Elevate your leg when you are not up walking on it.   o Continue to use the breathing machine you got in the hospital (incentive spirometer) which will help keep your temperature down.  It is common for your temperature to cycle up and down following surgery, especially at night when you are not up moving around and exerting yourself.  The breathing machine keeps your lungs expanded and your temperature down.   DIET:  As you were doing prior to hospitalization, we recommend a well-balanced diet.  DRESSING / WOUND CARE / SHOWERING  Dressing is waterproof.  Able to shower.  The staples will be removed at [facility] clinic in 2 weeks.  ACTIVITY  o Increase  activity slowly as tolerated, but follow the weight bearing instructions below.   o No driving for 6 weeks or until further direction given by your physician.  You cannot drive while taking narcotics.  o No lifting or carrying greater than 10 lbs. until further directed by your surgeon. o Avoid periods of inactivity such as sitting longer than an hour when not asleep. This helps prevent blood clots.  o You may return to work once you are authorized by your doctor.     WEIGHT BEARING  Toe-touch weightbearing on the right with a walker.   EXERCISES Gait training and ambulation with a walker.  CONSTIPATION  Constipation is defined medically as fewer than three stools per week and severe constipation as less than one stool per week.  Even if you have a regular bowel pattern at home, your normal regimen is likely to be disrupted due to multiple reasons following surgery.  Combination of anesthesia, postoperative narcotics, change in appetite and fluid intake all can affect your bowels.   YOU MUST use at least one of the following options; they are listed in order of increasing strength to get the job done.  They are all available over the counter, and you may need to use some, POSSIBLY even all of these options:    Drink plenty of fluids (prune juice may be helpful) and high fiber foods Colace 100 mg by mouth twice a day  Senokot for constipation as directed and  as needed Dulcolax (bisacodyl), take with full glass of water  Miralax (polyethylene glycol) once or twice a day as needed.  If you have tried all these things and are unable to have a bowel movement in the first 3-4 days after surgery call either your surgeon or your primary doctor.    If you experience loose stools or diarrhea, hold the medications until you stool forms back up.  If your symptoms do not get better within 1 week or if they get worse, check with your doctor.  If you experience "the worst abdominal pain ever" or develop  nausea or vomiting, please contact the office immediately for further recommendations for treatment.   ITCHING:  If you experience itching with your medications, try taking only a single pain pill, or even half a pain pill at a time.  You can also use Benadryl over the counter for itching or also to help with sleep.   TED HOSE STOCKINGS:  Use stockings on both legs until for at least 2 weeks or as directed by physician office. They may be removed at night for sleeping.  MEDICATIONS:  See your medication summary on the After Visit Summary that nursing will review with you.  You may have some home medications which will be placed on hold until you complete the course of blood thinner medication.  It is important for you to complete the blood thinner medication as prescribed.  PRECAUTIONS:  If you experience chest pain or shortness of breath - call 911 immediately for transfer to the hospital emergency department.   If you develop a fever greater that 101 F, purulent drainage from wound, increased redness or drainage from wound, foul odor from the wound/dressing, or calf pain - CONTACT YOUR SURGEON.                                                   FOLLOW-UP APPOINTMENTS:  If you do not already have a post-op appointment, please call the office for an appointment to be seen by your surgeon.  Guidelines for how soon to be seen are listed in your After Visit Summary, but are typically between 1-4 weeks after surgery.  OTHER INSTRUCTIONS:     MAKE SURE YOU:   Understand these instructions.   Get help right away if you are not doing well or get worse.    Thank you for letting us be a part of your medical care team.  It is a privilege we respect greatly.  We hope these instructions will help you stay on track for a fast and full recovery!     When should you call for help?  Call 911 anytime you think you may need emergency care. For example, call if:   You have severe trouble breathing.    You have severe chest pain. Call your doctor now or seek immediate medical care if:   You have new or worse shortness of breath.   You develop new chest pain.   You are coughing more deeply or more often, especially if you notice more mucus or a change in the color of your mucus.   You cough up blood.   You have new or increased swelling in your legs or belly.   You have a fever. Watch closely for changes in your health, and be sure to contact your  doctor if:   You use your antibiotic prescription.   Your symptoms are getting worse   How can you care for yourself at home?   During an exacerbation   Do not panic if you start to have one. Quick treatment at home may help you prevent serious breathing problems. If you have a COPD exacerbation plan that you developed with your doctor, follow it.   Take your medicines exactly as your doctor tells you.   Use your inhaler as directed by your doctor. If your symptoms do not get better after you use your medicine, have someone take you to the emergency room. Call an ambulance if necessary.   With inhaled medicines, a spacer or a nebulizer may help you get more medicine to your lungs. Ask your doctor or pharmacist how to use them properly. Practice using the spacer in front of a mirror before you have an exacerbation. This may help you get the medicine into your lungs quickly.   If your doctor has given you steroid pills, take them as directed.   Your doctor may have given you a prescription for antibiotics, which you are to fill if you need it. Call your doctor if you use the prescription.   Talk to your doctor if you have any problems with your medicine.  Preventing an exacerbation   Do not smoke. This is the most important step you can take to prevent more damage to your lungs and prevent problems. If you already smoke, it is never too late to stop. If you need help quitting, talk to your doctor about stop-smoking programs and  medicines. These can increase your chances of quitting for good.   Take your daily medicines as prescribed.   Avoid colds and flu.   Get a pneumococcal vaccine.   Get a flu vaccine each year, as soon as it is available. Ask those you live or work with to do the same, so they will not get the flu and infect you.   Try to stay away from people with colds or the flu.   Wash your hands often.  Avoid secondhand smoke; air pollution; cold, dry air; hot, humid air; and high altitudes. Stay at home with your windows closed when air pollution is bad.   Learn breathing techniques for COPD, such as breathing through pursed lips. These techniques can help you breathe easier during an exacerbation.  Staying healthy   Do not smoke. This is the most important step you can take to prevent more damage to your lungs. If you need help quitting, talk to your doctor about stop-smoking programs and medicines. These can increase your chances of quitting for good.   Avoid colds and flu. Get a pneumococcal vaccine shot. If you have had one before, ask your doctor whether you need a second dose. Get the flu vaccine every fall. If you must be around people with colds or the flu, wash your hands often.   Avoid secondhand smoke, air pollution, and high altitudes. Also avoid cold, dry air and hot, humid air. Stay at home with your windows closed when air pollution is bad.  Medicines and oxygen therapy   Take your medicines exactly as prescribed. Call your doctor if you think you are having a problem with your medicine.   You may be taking medicines such as:   Bronchodilators. These help open your airways and make breathing easier. Bronchodilators are either short-acting (work for 6 to 9 hours) or long-acting (work for [employer]  hours). You inhale most bronchodilators, so they start to act quickly. Always carry your quick-relief inhaler with you in case you need it while you are away from home.   Corticosteroids  (prednisone, budesonide). These reduce airway inflammation. They come in pill or inhaled form. You must take these medicines every day for them to work well.  A spacer may help you get more inhaled medicine to your lungs. Ask your doctor or pharmacist if a spacer is right for you. If it is, ask how to use it properly.   Do not take any vitamins, over-the-counter medicine, or herbal products without talking to your doctor first.   If your doctor prescribed antibiotics, take them as directed. Do not stop taking them just because you feel better. You need to take the full course of antibiotics.   Oxygen therapy boosts the amount of oxygen in your blood and helps you breathe easier. Use the flow rate your doctor has recommended, and do not change it without talking to your doctor first.  Activity   Get regular exercise. Walking is an easy way to get exercise. Start out slowly, and walk a little more each day.   Pay attention to your breathing. You are exercising too hard if you cannot talk while you are exercising.   Take short rest breaks when doing household chores and other activities.   Learn breathing methods--such as breathing through pursed lips--to help you become less short of breath.   If your doctor has not set you up with a pulmonary rehabilitation program, talk to him or her about whether rehab is right for you. Rehab includes exercise programs, education about your disease and how to manage it, help with diet and other changes, and emotional support.  Diet   Eat regular, healthy meals. Use bronchodilators about 1 hour before you eat to make it easier to eat. Eat several small meals instead of three large ones. Drink beverages at the end of the meal. Avoid foods that are hard to chew.   Eat foods that contain fat and protein so that you do not lose weight and muscle mass. These foods include ice cream, pudding, cheese, eggs, and peanut butter.   Use less salt. Too much salt can  cause you to retain fluids, which makes it harder to breathe. Do not add salt while you are cooking or at the table. Eat fewer processed foods and foods from restaurants, including fast foods. Use fresh or frozen foods instead of canned foods.  Mental health   Talk to your family, friends, or a therapist about your feelings. It is normal to feel frightened, angry, hopeless, helpless, and even guilty. Talking openly about bad feelings can help you cope. If these feelings last, talk to your doctor.

## 2018-11-18 NOTE — Progress Notes (Signed)
  Subjective: 1 Day Post-Op Procedure(s) (LRB): CANNULATED HIP PINNING (Right) Patient reports pain as moderate.   Patient seen in rounds with Dr. Allena Katz. Patient is well, and has had no acute complaints or problems Plan is to go Home after hospital stay. Negative for chest pain and shortness of breath Fever: no Gastrointestinal: Negative for nausea and vomiting  Objective: Vital signs in last 24 hours: Temp:  [97.3 F (36.3 C)-99.3 F (37.4 C)] 97.9 F (36.6 C) (03/04 0453) Pulse Rate:  [66-87] 75 (03/04 0453) Resp:  [0-19] 19 (03/04 0453) BP: (101-160)/(54-83) 122/75 (03/04 0453) SpO2:  [93 %-100 %] 99 % (03/04 0453)  Intake/Output from previous day:  Intake/Output Summary (Last 24 hours) at 11/18/2018 0708 Last data filed at 11/18/2018 0648 Gross per 24 hour  Intake 928.31 ml  Output 550 ml  Net 378.31 ml    Intake/Output this shift: No intake/output data recorded.  Labs: Recent Labs    11/16/18 1320 11/17/18 0317 11/18/18 0501  HGB 13.5 12.9 11.4*   Recent Labs    11/17/18 0317 11/18/18 0501  WBC 10.8* 17.9*  RBC 4.43 3.91  HCT 42.2 37.6  PLT 298 268   Recent Labs    11/17/18 0317 11/18/18 0501  NA 142 141  K 3.8 4.0  CL 100 106  CO2 29 27  BUN 13 20  CREATININE 0.41* 0.46  GLUCOSE 137* 110*  CALCIUM 9.0 8.8*   Recent Labs    11/16/18 1851  INR 1.0     EXAM General - Patient is Alert and Oriented Extremity - Sensation intact distally Intact pulses distally Dorsiflexion/Plantar flexion intact Compartment soft Dressing/Incision - clean, dry, blood tinged drainage Motor Function - intact, moving foot and toes well on exam.   Past Medical History:  Diagnosis Date  . Anxiety    panic attacks  . Asthma   . BOOP (bronchiolitis obliterans with organizing pneumonia) (HCC)   . CHF (congestive heart failure) (HCC)   . Dysphagia, pharyngoesophageal phase 05/30/2015  . Emphysema lung (HCC) 05/30/2015   on steroids and nebulizer for copd issues.   09/15/18  . GERD (gastroesophageal reflux disease)   . Hypercholesterolemia   . Hypertension   . Lung mass   . Migraines   . Motion sickness    all moving vehicles  . Myocardial infarction (HCC) 2012  . Respiratory failure with hypercapnia (HCC) 08/2018   was hospitalized and on bipap. steroids and nebulizer treatments helped  . Seasonal allergies   . Shortness of breath dyspnea    1 flight-stairs  . Sternal fracture     Assessment/Plan: 1 Day Post-Op Procedure(s) (LRB): CANNULATED HIP PINNING (Right) Active Problems:   Hip fracture (HCC)  Estimated body mass index is 27.17 kg/m as calculated from the following:   Height as of this encounter: 4\' 10"  (1.473 m).   Weight as of this encounter: 59 kg. Advance diet Up with therapy D/C IV fluids Discharge home with home health when cleared by medicine Follow-up at [facility] clinic in 2 weeks for staple removal.  DVT Prophylaxis - Lovenox, Foot Pumps and TED hose Toe-touch weight-Bearing  to right leg  Dedra Skeens, PA-C Orthopaedic Surgery 11/18/2018, 7:08 AM

## 2018-11-18 NOTE — Care Management (Signed)
Faxed order to [facility] at [phone] for 3 in 1

## 2018-11-18 NOTE — Progress Notes (Signed)
[facility] [place] at [facility]   PATIENT NAME: Caitlyn Evans    MR#:  [id number]  DATE OF BIRTH:  01/16/1966  SUBJECTIVE:  Pt came in with increasing right hip pain for few days. Denies fall or any trauma. Found to have right hip fracture. POD#1 Had spinal fusion surgery about 1 month ago  REVIEW OF SYSTEMS:   Review of Systems  Constitutional: Negative for chills, fever and weight loss.  HENT: Negative for ear discharge, ear pain and nosebleeds.   Eyes: Negative for blurred vision, pain and discharge.  Respiratory: Negative for sputum production, shortness of breath, wheezing and stridor.   Cardiovascular: Negative for chest pain, palpitations, orthopnea and PND.  Gastrointestinal: Negative for abdominal pain, diarrhea, nausea and vomiting.  Genitourinary: Negative for frequency and urgency.  Musculoskeletal: Negative for back pain and joint pain.  Neurological: Negative for sensory change, speech change, focal weakness and weakness.  Psychiatric/Behavioral: Negative for depression and hallucinations. The patient is not nervous/anxious.    Tolerating Diet:yes Tolerating JJ:KKXFG--HW will go home with hHPT  DRUG ALLERGIES:   Allergies  Allergen Reactions  . Codeine Hives, Nausea And Vomiting and Nausea Only  . Penicillins Hives and Other (See Comments)    Has patient had a PCN reaction causing immediate rash, facial/tongue/throat swelling, SOB or lightheadedness with hypotension: No Has patient had a PCN reaction causing severe rash involving mucus membranes or skin necrosis: No Has patient had a PCN reaction that required hospitalization No Has patient had a PCN reaction occurring within the last 10 years: No If all of the above answers are "NO", then may proceed with Cephalosporin use. Other reaction(s): Other (See Comments) Has patient had a PCN    VITALS:  Blood pressure 125/65, pulse 70, temperature 97.8 F (36.6 C), temperature source  Oral, resp. rate 20, height 4\' 10"  (1.473 m), weight 59 kg, SpO2 98 %.  PHYSICAL EXAMINATION:   Physical Exam  GENERAL:  53 y.o.-year-old patient lying in the bed with no acute distress. Cushingoid face, anxious EYES: Pupils equal, round, reactive to light and accommodation. No scleral icterus. Extraocular muscles intact.  HEENT: Head atraumatic, normocephalic. Oropharynx and nasopharynx clear.  NECK:  Supple, no jugular venous distention. No thyroid enlargement, no tenderness.  LUNGS: distant breath sounds bilaterally, no wheezing, rales, rhonchi. No use of accessory muscles of respiration.  CARDIOVASCULAR: S1, S2 normal. No murmurs, rubs, or gallops.  ABDOMEN: Soft, nontender, nondistended. Bowel sounds present. No organomegaly or mass.  EXTREMITIES: No cyanosis, clubbing or edema b/l.   Decreased ROM right hip NEUROLOGIC: Cranial nerves II through XII are intact. No focal Motor or sensory deficits b/l.   PSYCHIATRIC:  patient is alert and oriented x 3.  SKIN: No obvious rash, lesion, or ulcer.   LABORATORY PANEL:  CBC Recent Labs  Lab 11/18/18 0501  WBC 17.9*  HGB 11.4*  HCT 37.6  PLT 268    Chemistries  Recent Labs  Lab 11/18/18 0501  NA 141  K 4.0  CL 106  CO2 27  GLUCOSE 110*  BUN 20  CREATININE 0.46  CALCIUM 8.8*   Cardiac Enzymes Recent Labs  Lab 11/16/18 1320  TROPONINI <0.03   RADIOLOGY:  Ct Hip Right Wo Contrast  Result Date: 11/16/2018 CLINICAL DATA:  Severe right hip pain.  No known injury. EXAM: CT OF THE RIGHT HIP WITHOUT CONTRAST TECHNIQUE: Multidetector CT imaging of the right hip was performed according to the standard protocol. Multiplanar CT image reconstructions were also  generated. COMPARISON:  Right hip x-rays from same day. FINDINGS: Bones/Joint/Cartilage Acute, minimally impacted fracture of the right femoral neck. No dislocation. The right hip joint space is preserved. No significant joint effusion. Ligaments Suboptimally assessed by CT.  Muscles and Tendons Intact. Soft tissues Unremarkable. IMPRESSION: 1. Acute, minimally impacted fracture of the right femoral neck. Electronically Signed   By: Obie Dredge M.D.   On: 11/16/2018 17:23   US Venous Img Lower Bilateral  Result Date: 11/16/2018 CLINICAL DATA:  Leg swelling.  History of back surgery. EXAM: BILATERAL LOWER EXTREMITY VENOUS DOPPLER ULTRASOUND TECHNIQUE: Gray-scale sonography with graded compression, as well as color Doppler and duplex ultrasound were performed to evaluate the lower extremity deep venous systems from the level of the common femoral vein and including the common femoral, femoral, profunda femoral, popliteal and calf veins including the posterior tibial, peroneal and gastrocnemius veins when visible. The superficial great saphenous vein was also interrogated. Spectral Doppler was utilized to evaluate flow at rest and with distal augmentation maneuvers in the common femoral, femoral and popliteal veins. COMPARISON:  None. FINDINGS: RIGHT LOWER EXTREMITY Common Femoral Vein: No evidence of thrombus. Normal compressibility, respiratory phasicity and response to augmentation. Saphenofemoral Junction: No evidence of thrombus. Normal compressibility and flow on color Doppler imaging. Profunda Femoral Vein: No evidence of thrombus. Normal compressibility and flow on color Doppler imaging. Femoral Vein: No evidence of thrombus. Normal compressibility, respiratory phasicity and response to augmentation. Popliteal Vein: No evidence of thrombus. Normal compressibility, respiratory phasicity and response to augmentation. Calf Veins: No evidence of thrombus. Normal compressibility and flow on color Doppler imaging. Other Findings:  None. LEFT LOWER EXTREMITY Common Femoral Vein: No evidence of thrombus. Normal compressibility, respiratory phasicity and response to augmentation. Saphenofemoral Junction: No evidence of thrombus. Normal compressibility and flow on color Doppler  imaging. Profunda Femoral Vein: No evidence of thrombus. Normal compressibility and flow on color Doppler imaging. Femoral Vein: No evidence of thrombus. Normal compressibility, respiratory phasicity and response to augmentation. Popliteal Vein: No evidence of thrombus. Normal compressibility, respiratory phasicity and response to augmentation. Calf Veins: No evidence of thrombus. Normal compressibility and flow on color Doppler imaging. Other Findings:  None. IMPRESSION: No evidence of deep venous thrombosis in the lower extremities. Electronically Signed   By: Richarda Overlie M.D.   On: 11/16/2018 15:15   Dg Chest Portable 1 View  Result Date: 11/16/2018 CLINICAL DATA:  Shortness of breath since yesterday. History of myocardial infarction, emphysema, LEFT lobectomy. EXAM: PORTABLE CHEST 1 VIEW COMPARISON:  Chest radiograph October 03, 2018 FINDINGS: Cardiomediastinal silhouette is normal. No pleural effusions or focal consolidations. Surgical clips LEFT upper lung zone. Biapical pleural thickening. Trachea projects midline and there is no pneumothorax. Soft tissue planes and included osseous structures are non-suspicious. New cervicothoracic spinal hardware. IMPRESSION: 1. No acute cardiopulmonary process. 2. Postsurgical changes LEFT upper lung zone. Electronically Signed   By: Awilda Metro M.D.   On: 11/16/2018 13:43   Dg Hip Operative Unilat W Or W/o Pelvis Right  Result Date: 11/17/2018 CLINICAL DATA:  RIGHT hip IM nail. EXAM: OPERATIVE RIGHT HIP (WITH PELVIS IF PERFORMED) 11 VIEWS TECHNIQUE: Fluoroscopic spot image(s) were submitted for interpretation post-operatively. FLUOROSCOPY TIME:  2.8 minutes. COMPARISON:  CT RIGHT hip November 16, 2018 FINDINGS: 3 cannulated partially threaded screws transfix femoral neck fracture. IMPRESSION: 1. RIGHT femoral neck ORIF. Electronically Signed   By: Awilda Metro M.D.   On: 11/17/2018 14:47   Dg Hip Unilat W Or Wo Pelvis 2-3 Views  Right  Result Date:  11/16/2018 CLINICAL DATA:  Hip pain EXAM: DG HIP (WITH OR WITHOUT PELVIS) 2-3V RIGHT COMPARISON:  None. FINDINGS: There is cortical irregularity noted in the femoral neck region. Concern for possible nondisplaced femoral neck fracture. No subluxation or dislocation. IMPRESSION: Concern for subtle nondisplaced right femoral neck fracture. This could be confirmed with CT. Electronically Signed   By: Charlett Nose M.D.   On: 11/16/2018 16:00   ASSESSMENT AND PLAN:   53 year old female with chronic hypoxic respiratory failure due to COPD on 2-1/2 L of oxygen at home and chronic diastolic heart failure presented to the emergency room due to shortness of breath and hip pain.  1. nondisplaced right femoral neck fracture s/p right hip surgery POD #1 - Pathological likely due to chronic steroid use -DVT prophylaxis as per orthopedic surgery--lovenox  2.  Acute COPD exacerbation: recieved IV steroids (patient is on chronic steroids at home)--change to po home dose steroids Continue nebs and inhalers, incentive spirometer Doxycycline for acute bronchitis  3.  Essential hypertension - Continue diltiazem  4.  Chronic diastolic heart failure without signs of exacerbation Monitor intake and output with daily weight Continue Lasix  5.  Recent back surgery with pain: Continue morphine and oxycodone  6.  Hyperlipidemia: Continue statin  PT to be started today. Patient will go to home tomorrow if she remains stable with home health PT RN and aide along with social worker. Patient is agreeable.   CODE STATUS: full  DVT Prophylaxis: start per Ortho after surgery  TOTAL TIME TAKING CARE OF THIS PATIENT: *30 minutes.  >50% time spent on counselling and coordination of care  POSSIBLE D/C IN 1 DAYS, DEPENDING ON CLINICAL CONDITION.  Note: This dictation was prepared with Dragon dictation along with smaller phrase technology. Any transcriptional errors that result from this process are  unintentional.  Enedina Finner M.D on 11/18/2018 at 11:25 AM  Between 7am to 6pm - Pager - [phone]  After 6pm go to [email or web address] - Social research officer, government  [facility]  Office  [phone]  CC: Primary care physician; Corky Downs, MDPatient ID: Caitlyn Evans, female   DOB: [birth year], 53 y.o.   MRN: [id number]

## 2018-11-18 NOTE — Progress Notes (Signed)
Physical Therapy Treatment Patient Details Name: Caitlyn Evans MRN: [id number] DOB: 14-Nov-1965 Today's Date: 11/18/2018    History of Present Illness From MD H&P: Pt is a 53 y.o. female with a known history of chronic hypoxic respiratory failure on 2-1/2 to 3 L of oxygen at home due to COPD, chronic diastolic heart failure with preserved ejection fraction and hyperlipidemia who presented to the emergency room due to shortness of breath and hip pain.  Pt stated her shortness of breath is improved however she continues to have hip pain.  She denied fall.  She had recent spinal fusion in February at [facility] and since that time she has been ambulating however over the past several days she was having much difficulty ambulating and she could not bear weight on her RLE.  CT of the right hip showed a femoral neck fracture.  Pt now s/p percutaneous pinning of right femoral neck fracture.  Assessment also includes: acute COPD exacerbation, HTN, HLD, chronic CHF, and hypokalemia.      PT Comments    Pt presents with deficits in strength, transfers, mobility, gait, balance, and activity tolerance but is highly motivated to participate with PT services and showed some progress towards goals this session.  Pt required min A with log roll training with lumbar corset donned once up in sitting.  Pt was CGA with transfers this session with good control and stability with RLE WB status maintained throughout.  Pt was able to amb 3 x 6' with a RW, CGA, and mod cues for sequencing for WB compliance with good stability and improved step length.  Pt's SpO2 and HR WNL during the session on 2LO2/min.  Pt will benefit from PT services in a SNF setting upon discharge to safely address above deficits for decreased caregiver assistance and eventual return to PLOF.       Follow Up Recommendations  SNF;Supervision for mobility/OOB     Equipment Recommendations  Other (comment)(ramp for safe home entry/exit)    Recommendations  for Other Services       Precautions / Restrictions Precautions Precautions: Fall;Back Precaution Comments: No back precautions listed in chart review (Sx at [facility]) with lumbar corset brace in the room; standard back precautions assumed during session. Required Braces or Orthoses: Spinal Brace Spinal Brace: Lumbar corset Restrictions Weight Bearing Restrictions: Yes RLE Weight Bearing: Touchdown weight bearing Other Position/Activity Restrictions: Flat foot TDWB with weight of the leg only    Mobility  Bed Mobility Overal bed mobility: Needs Assistance Bed Mobility: Sit to Sidelying;Sidelying to Sit   Sidelying to sit: Min assist     Sit to sidelying: Min assist General bed mobility comments: Log roll technique training provided  Transfers Overall transfer level: Needs assistance Equipment used: Rolling walker (2 wheeled) Transfers: Sit to/from Stand Sit to Stand: Min guard;From elevated surface         General transfer comment: Mod verbal cues for sequencing to maintain RLE WB status  Ambulation/Gait Ambulation/Gait assistance: Min assist Gait Distance (Feet): 6 Feet x 3 with therapeutic rest breaks between walks Assistive device: Rolling walker (2 wheeled) Gait Pattern/deviations: Step-to pattern;Trunk flexed Gait velocity: decreased   General Gait Details: Mod verbal and tactile cues for sequencing to ensure WB compliance   Stairs             Wheelchair Mobility    Modified Rankin (Stroke Patients Only)       Balance Overall balance assessment: Mild deficits observed, not formally tested  Cognition Arousal/Alertness: Awake/alert Behavior During Therapy: WFL for tasks assessed/performed Overall Cognitive Status: Within Functional Limits for tasks assessed                                        Exercises Total Joint Exercises Ankle Circles/Pumps: Strengthening;Both;10  reps;15 reps Quad Sets: Strengthening;Both;10 reps;15 reps Gluteal Sets: Strengthening;Both;10 reps;15 reps Heel Slides: AAROM;Both;5 reps;AROM Hip ABduction/ADduction: AROM;AAROM;Both;10 reps;15 reps Straight Leg Raises: AROM;AAROM;Both;10 reps;15 reps Long Arc Quad: Strengthening;Both;10 reps;15 reps Knee Flexion: Strengthening;Both;10 reps;15 reps Other Exercises Other Exercises: Standing R hip SLR, hip abd, and hip ext x 10 each Other Exercises: HEP review for BLE APs, QS, GS, and LAQ x 10 each  5-6x/day Other Exercises: Log roll training practice/review with back precaution review    General Comments        Pertinent Vitals/Pain Pain Assessment: 0-10 Pain Score: 8  Pain Location: RLE Pain Descriptors / Indicators: Aching;Sore Pain Intervention(s): Premedicated before session;Patient requesting pain meds-RN notified;Monitored during session;Limited activity within patient's tolerance    Home Living Family/patient expects to be discharged to:: Private residence Living Arrangements: Spouse/significant other;Other relatives(Lives with spouse and granddaughter) Available Help at Discharge: Family;Available 24 hours/day(Combination of spouse, daughter, and granddaughter) Type of Home: Mobile home Home Access: Stairs to enter Entrance Stairs-Rails: None Home Layout: One level Home Equipment: Environmental consultant - 2 wheels;Cane - single point;Shower seat;Grab bars - tub/shower;Wheelchair - manual      Prior Function Level of Independence: Independent      Comments: Prior to back Sx this past February pt was Ind with amb without an AD community distances, goes to a gym, one fall in the last year, Ind with ADLs   PT Goals (current goals can now be found in the care plan section) Acute Rehab PT Goals Patient Stated Goal: To get stronger and walk better PT Goal Formulation: With patient Time For Goal Achievement: 12/01/18 Potential to Achieve Goals: Good Progress towards PT goals:  Progressing toward goals    Frequency    BID      PT Plan Current plan remains appropriate    Co-evaluation              AM-PAC PT "6 Clicks" Mobility   Outcome Measure  Help needed turning from your back to your side while in a flat bed without using bedrails?: A Little Help needed moving from lying on your back to sitting on the side of a flat bed without using bedrails?: A Little Help needed moving to and from a bed to a chair (including a wheelchair)?: A Little Help needed standing up from a chair using your arms (e.g., wheelchair or bedside chair)?: A Little Help needed to walk in hospital room?: Total Help needed climbing 3-5 steps with a railing? : Total 6 Click Score: 14    End of Session Equipment Utilized During Treatment: Gait belt;Back brace Activity Tolerance: Patient tolerated treatment well Patient left: in bed;with call bell/phone within reach;with bed alarm set;with SCD's reapplied;Other (comment)(Pt declined up in chair) Nurse Communication: Mobility status PT Visit Diagnosis: Muscle weakness (generalized) (M62.81);Other abnormalities of gait and mobility (R26.89);Pain Pain - Right/Left: Right Pain - part of body: Hip     Time: 1410-1448 PT Time Calculation (min) (ACUTE ONLY): 38 min  Charges:  $Gait Training: 8-22 mins $Therapeutic Exercise: 8-22 mins $Therapeutic Activity: 8-22 mins  Ovidio Hanger PT, DPT 11/18/18, 3:21 PM

## 2018-11-18 NOTE — Evaluation (Signed)
Physical Therapy Evaluation Patient Details Name: Caitlyn Evans MRN: [id number] DOB: 22-Jun-1966 Today's Date: 11/18/2018   History of Present Illness  From MD H&P: Pt is a 53 y.o. female with a known history of chronic hypoxic respiratory failure on 2-1/2 to 3 L of oxygen at home due to COPD, chronic diastolic heart failure with preserved ejection fraction and hyperlipidemia who presented to the emergency room due to shortness of breath and hip pain.  Pt stated her shortness of breath is improved however she continues to have hip pain.  She denied fall.  She had recent spinal fusion in February at [facility] and since that time she has been ambulating however over the past several days she was having much difficulty ambulating and she could not bear weight on her RLE.  CT of the right hip showed a femoral neck fracture.  Pt now s/p percutaneous pinning of right femoral neck fracture.  Assessment also includes: acute COPD exacerbation, HTN, HLD, chronic CHF, and hypokalemia.      Clinical Impression  Pt presents with deficits in strength, transfers, mobility, gait, balance, and activity tolerance.  Pt required min A with bed mobility using log roll technique.  Pt was CGA with transfers with cues for WB compliance.  Pt was able to take 2-3 small, effortful steps with a RW and CGA with cues for proper sequencing for WB compliance.  With the combination of recent back surgery and current hip surgery with limited WB status pt would be unsafe to return to her prior living situation at this time.  Pt will benefit from PT services in a SNF setting upon discharge to safely address above deficits for decreased caregiver assistance and eventual return to PLOF.      Follow Up Recommendations SNF;Supervision for mobility/OOB    Equipment Recommendations  Other (comment)(Pt would benefit from a ramp for safe home entry/exit )    Recommendations for Other Services       Precautions / Restrictions  Precautions Precautions: Fall;Back Precaution Comments: No back precautions listed in chart review (back Sx at [facility]) with lumbar corset brace in the room; standard back precautions assumed during session. Required Braces or Orthoses: Spinal Brace Spinal Brace: Lumbar corset Restrictions Weight Bearing Restrictions: Yes RLE Weight Bearing: Touchdown weight bearing(Flat foot TDWB with weight of the leg only)      Mobility  Bed Mobility Overal bed mobility: Needs Assistance Bed Mobility: Sit to Sidelying;Sidelying to Sit   Sidelying to sit: Min assist     Sit to sidelying: Min assist General bed mobility comments: Log roll technique training provided  Transfers Overall transfer level: Needs assistance Equipment used: Rolling walker (2 wheeled) Transfers: Sit to/from Stand Sit to Stand: Min guard;From elevated surface         General transfer comment: Mod verbal cues for sequencing to maintain RLE WB status  Ambulation/Gait Ambulation/Gait assistance: Min assist Gait Distance (Feet): 3 Feet Assistive device: Rolling walker (2 wheeled) Gait Pattern/deviations: Step-to pattern;Trunk flexed Gait velocity: decreased   General Gait Details: One step with pt's R foot flat on top of this PT's foot to ensure WB compliance with mod verbal and tactile cues for sequencing   Stairs            Wheelchair Mobility    Modified Rankin (Stroke Patients Only)       Balance Overall balance assessment: Mild deficits observed, not formally tested  Pertinent Vitals/Pain Pain Assessment: 0-10 Pain Score: 6  Pain Location: R hip and back Pain Descriptors / Indicators: Aching;Sore Pain Intervention(s): Premedicated before session;Monitored during session;Limited activity within patient's tolerance    Home Living Family/patient expects to be discharged to:: Private residence Living Arrangements: Spouse/significant  other;Other relatives(Lives with spouse and granddaughter) Available Help at Discharge: Family;Available 24 hours/day(Combination of spouse, daughter, and granddaughter) Type of Home: Mobile home Home Access: Stairs to enter Entrance Stairs-Rails: None Entrance Stairs-Number of Steps: 4 Home Layout: One level Home Equipment: Environmental consultant - 2 wheels;Cane - single point;Shower seat;Grab bars - tub/shower;Wheelchair - manual      Prior Function Level of Independence: Independent         Comments: Prior to back Sx this past February pt was Ind with amb without an AD community distances, goes to a gym, one fall in the last year, Ind with ADLs     Hand Dominance   Dominant Hand: Right    Extremity/Trunk Assessment   Upper Extremity Assessment Upper Extremity Assessment: Generalized weakness    Lower Extremity Assessment Lower Extremity Assessment: Generalized weakness       Communication   Communication: No difficulties  Cognition Arousal/Alertness: Awake/alert Behavior During Therapy: WFL for tasks assessed/performed Overall Cognitive Status: Within Functional Limits for tasks assessed                                        General Comments      Exercises Total Joint Exercises Ankle Circles/Pumps: Strengthening;Both;10 reps Quad Sets: Strengthening;Both;10 reps Gluteal Sets: Strengthening;Both;10 reps Heel Slides: AAROM;Both;5 reps;AROM Hip ABduction/ADduction: AROM;AAROM;Both;5 reps Long Arc Quad: Strengthening;Both;10 reps Knee Flexion: Strengthening;Both;10 reps Other Exercises Other Exercises: Donning/doffing lumbar corsett brace in sitting at EOB Other Exercises: HEP for BLE APs, QS, GS, and LAQ x 10 each  5-6x/day   Assessment/Plan    PT Assessment Patient needs continued PT services  PT Problem List Decreased strength;Decreased activity tolerance;Decreased balance;Decreased mobility;Decreased knowledge of use of DME;Decreased knowledge of  precautions       PT Treatment Interventions DME instruction;Gait training;Stair training;Functional mobility training;Therapeutic activities;Therapeutic exercise;Balance training;Patient/family education    PT Goals (Current goals can be found in the Care Plan section)  Acute Rehab PT Goals Patient Stated Goal: To get stronger and walk better PT Goal Formulation: With patient Time For Goal Achievement: 12/01/18 Potential to Achieve Goals: Good    Frequency BID   Barriers to discharge Inaccessible home environment      Co-evaluation               AM-PAC PT "6 Clicks" Mobility  Outcome Measure Help needed turning from your back to your side while in a flat bed without using bedrails?: A Little Help needed moving from lying on your back to sitting on the side of a flat bed without using bedrails?: A Little Help needed moving to and from a bed to a chair (including a wheelchair)?: A Little Help needed standing up from a chair using your arms (e.g., wheelchair or bedside chair)?: A Little Help needed to walk in hospital room?: Total Help needed climbing 3-5 steps with a railing? : Total 6 Click Score: 14    End of Session Equipment Utilized During Treatment: Gait belt Activity Tolerance: Patient tolerated treatment well Patient left: in bed;with call bell/phone within reach;with bed alarm set;with SCD's reapplied Nurse Communication: Mobility status PT Visit Diagnosis: Muscle weakness (  generalized) (M62.81);Other abnormalities of gait and mobility (R26.89);Pain Pain - Right/Left: Right Pain - part of body: Hip    Time: 1610-9604 PT Time Calculation (min) (ACUTE ONLY): 27 min   Charges:   PT Evaluation $PT Eval Low Complexity: 1 Low PT Treatments $Therapeutic Exercise: 8-22 mins        D. Scott Maleyah Evans PT, DPT 11/18/18, 1:36 PM

## 2018-11-19 LAB — CBC
HCT: 38.7 % (ref 36.0–46.0)
Hemoglobin: 11.8 g/dL — ABNORMAL LOW (ref 12.0–15.0)
MCH: 29.1 pg (ref 26.0–34.0)
MCHC: 30.5 g/dL (ref 30.0–36.0)
MCV: 95.6 fL (ref 80.0–100.0)
Platelets: 263 10*3/uL (ref 150–400)
RBC: 4.05 MIL/uL (ref 3.87–5.11)
RDW: 15.9 % — ABNORMAL HIGH (ref 11.5–15.5)
WBC: 13.3 10*3/uL — ABNORMAL HIGH (ref 4.0–10.5)
nRBC: 0.3 % — ABNORMAL HIGH (ref 0.0–0.2)

## 2018-11-19 LAB — BASIC METABOLIC PANEL
Anion gap: 7 (ref 5–15)
BUN: 15 mg/dL (ref 6–20)
CALCIUM: 8.7 mg/dL — AB (ref 8.9–10.3)
CO2: 32 mmol/L (ref 22–32)
Chloride: 103 mmol/L (ref 98–111)
Creatinine, Ser: 0.44 mg/dL (ref 0.44–1.00)
GFR calc Af Amer: >60 mL/min (ref >60)
GFR calc non Af Amer: >60 mL/min (ref >60)
Glucose, Bld: 94 mg/dL (ref 70–99)
Potassium: 4.1 mmol/L (ref 3.5–5.1)
Sodium: 142 mmol/L (ref 135–145)

## 2018-11-19 NOTE — Care Management (Signed)
Notified the patient of the $3.60 copay for Lovenox

## 2018-11-19 NOTE — Progress Notes (Signed)
  Subjective: 2 Days Post-Op Procedure(s) (LRB): CANNULATED HIP PINNING (Right) Patient reports pain as moderate.   Patient seen in rounds with Dr. Allena Katz. Patient is well, and has had no acute complaints or problems Plan is to go Home after hospital stay. Negative for chest pain and shortness of breath Fever: no Gastrointestinal: Negative for nausea and vomiting  Objective: Vital signs in last 24 hours: Temp:  [97.8 F (36.6 C)-98.1 F (36.7 C)] 97.8 F (36.6 C) (03/04 2356) Pulse Rate:  [63-72] 72 (03/04 2356) Resp:  [18-20] 20 (03/04 2356) BP: (93-128)/(47-68) 99/58 (03/04 2356) SpO2:  [93 %-100 %] 99 % (03/04 2356)  Intake/Output from previous day:  Intake/Output Summary (Last 24 hours) at 11/19/2018 0600 Last data filed at 11/19/2018 0319 Gross per 24 hour  Intake 1216.64 ml  Output 650 ml  Net 566.64 ml    Intake/Output this shift: Total I/O In: -  Out: 650 [Urine:650]  Labs: Recent Labs    11/16/18 1320 11/17/18 0317 11/18/18 0501 11/19/18 0421  HGB 13.5 12.9 11.4* 11.8*   Recent Labs    11/18/18 0501 11/19/18 0421  WBC 17.9* 13.3*  RBC 3.91 4.05  HCT 37.6 38.7  PLT 268 263   Recent Labs    11/18/18 0501 11/19/18 0421  NA 141 142  K 4.0 4.1  CL 106 103  CO2 27 32  BUN 20 15  CREATININE 0.46 0.44  GLUCOSE 110* 94  CALCIUM 8.8* 8.7*   Recent Labs    11/16/18 1851  INR 1.0     EXAM General - Patient is Alert and Oriented Extremity - Sensation intact distally Intact pulses distally Dorsiflexion/Plantar flexion intact Compartment soft Dressing/Incision - clean, dry, blood tinged drainage Motor Function - intact, moving foot and toes well on exam.  Ambulated 6 feet with physical therapy.  Past Medical History:  Diagnosis Date  . Anxiety    panic attacks  . Asthma   . BOOP (bronchiolitis obliterans with organizing pneumonia) (HCC)   . CHF (congestive heart failure) (HCC)   . Dysphagia, pharyngoesophageal phase 05/30/2015  .  Emphysema lung (HCC) 05/30/2015   on steroids and nebulizer for copd issues.  09/15/18  . GERD (gastroesophageal reflux disease)   . Hypercholesterolemia   . Hypertension   . Lung mass   . Migraines   . Motion sickness    all moving vehicles  . Myocardial infarction (HCC) 2012  . Respiratory failure with hypercapnia (HCC) 08/2018   was hospitalized and on bipap. steroids and nebulizer treatments helped  . Seasonal allergies   . Shortness of breath dyspnea    1 flight-stairs  . Sternal fracture     Assessment/Plan: 2 Days Post-Op Procedure(s) (LRB): CANNULATED HIP PINNING (Right) Active Problems:   Hip fracture (HCC)  Estimated body mass index is 27.17 kg/m as calculated from the following:   Height as of this encounter: 4\' 10"  (1.473 m).   Weight as of this encounter: 59 kg. Advance diet Up with therapy D/C IV fluids Discharge home with home health when cleared by medicine.  Probable tomorrow. Follow-up at [facility] clinic in 2 weeks for staple removal.  DVT Prophylaxis - Lovenox, Foot Pumps and TED hose Toe-touch weight-Bearing  to right leg  Dedra Skeens, PA-C Orthopaedic Surgery 11/19/2018, 6:00 AM

## 2018-11-19 NOTE — Progress Notes (Signed)
Physical Therapy Treatment Patient Details Name: Caitlyn Evans MRN: [id number] DOB: Jan 13, 1966 Today's Date: 11/19/2018    History of Present Illness From MD H&P: Pt is a 53 y.o. female with a known history of chronic hypoxic respiratory failure on 2-1/2 to 3 L of oxygen at home due to COPD, chronic diastolic heart failure with preserved ejection fraction and hyperlipidemia who presented to the emergency room due to shortness of breath and hip pain.  Pt stated her shortness of breath is improved however she continues to have hip pain.  She denied fall.  She had recent spinal fusion in February at [facility] and since that time she has been ambulating however over the past several days she was having much difficulty ambulating and she could not bear weight on her RLE.  CT of the right hip showed a femoral neck fracture.  Pt now s/p percutaneous pinning of right femoral neck fracture.  Assessment also includes: acute COPD exacerbation, HTN, HLD, chronic CHF, and hypokalemia.      PT Comments    Pt ready for mobility this pm.  To edge of bed with increased time and min assist.  Steady in sitting.  Min assist to don/doff back brace.  She was able to stand with min a x 1 to walker and maintain TTWB.  After seated rest, she was able to transfer to/from recliner but declined to remain up.  Min a x 1 to transfer with difficulty maintaining TTWB due to fatigue.  Session focused on transfer training and bed mobility as she is anticipating discharge to home.  Pt stated she will discharge home due to financial concerns paying for rehab.  Pt has walker at home but stated her home is not wheelchair friendly due to space limitations.  Plans to sleep in recliner in living room and transfer to commode initially upon discharge until mobility improves.   Follow Up Recommendations  SNF;Supervision for mobility/OOB     Equipment Recommendations  Other (comment)(Ramp)    Recommendations for Other Services        Precautions / Restrictions Precautions Precautions: Fall;Back Precaution Comments: No back precautions listed in chart review (Sx at [facility]) with lumbar corset brace in the room; standard back precautions assumed during session. Required Braces or Orthoses: Spinal Brace Spinal Brace: Lumbar corset Restrictions Weight Bearing Restrictions: Yes RLE Weight Bearing: Touchdown weight bearing Other Position/Activity Restrictions: Flat foot TDWB with weight of the leg only    Mobility  Bed Mobility Overal bed mobility: Needs Assistance Bed Mobility: Sit to Sidelying;Sidelying to Sit   Sidelying to sit: Min assist     Sit to sidelying: Min assist General bed mobility comments: Pt declined bed mobility training and OOB activities this session secondary to RLE pain  Transfers Overall transfer level: Needs assistance Equipment used: Rolling walker (2 wheeled) Transfers: Sit to/from Stand Sit to Stand: Min guard;From elevated surface;Min assist            Ambulation/Gait Ambulation/Gait assistance: Min assist Gait Distance (Feet): 2 Feet Assistive device: Rolling walker (2 wheeled) Gait Pattern/deviations: Step-to pattern;Trunk flexed Gait velocity: decreased   General Gait Details: Mod verbal and tactile cues for sequencing to ensure WB compliance   Stairs             Wheelchair Mobility    Modified Rankin (Stroke Patients Only)       Balance Overall balance assessment: Needs assistance Sitting-balance support: Feet supported Sitting balance-Leahy Scale: Fair     Standing balance support: Bilateral  upper extremity supported Standing balance-Leahy Scale: Poor                              Cognition Arousal/Alertness: Awake/alert Behavior During Therapy: WFL for tasks assessed/performed Overall Cognitive Status: Within Functional Limits for tasks assessed                                        Exercises Total Joint  Exercises Ankle Circles/Pumps: Strengthening;Both;10 reps;15 reps Quad Sets: Strengthening;Both;10 reps;15 reps Gluteal Sets: Strengthening;Both;10 reps;15 reps Short Arc Quad: AROM;Both;10 reps;15 reps Heel Slides: Both;5 reps;AROM Hip ABduction/ADduction: Strengthening;Left;10 reps;5 reps Straight Leg Raises: Strengthening;Left;10 reps;5 reps Other Exercises Other Exercises: HEP review for BLE APs, QS, GS, and LAQ x 10 each  5-6x/day    General Comments        Pertinent Vitals/Pain Pain Assessment: Faces Pain Score: 10-Worst pain ever Faces Pain Scale: Hurts whole lot Pain Location: RLE Pain Descriptors / Indicators: Aching;Sore;Guarding;Moaning Pain Intervention(s): Limited activity within patient's tolerance;Monitored during session    Home Living                      Prior Function            PT Goals (current goals can now be found in the care plan section) Progress towards PT goals: Progressing toward goals    Frequency    BID      PT Plan Current plan remains appropriate    Co-evaluation              AM-PAC PT "6 Clicks" Mobility   Outcome Measure  Help needed turning from your back to your side while in a flat bed without using bedrails?: A Little Help needed moving from lying on your back to sitting on the side of a flat bed without using bedrails?: A Little Help needed moving to and from a bed to a chair (including a wheelchair)?: A Little Help needed standing up from a chair using your arms (e.g., wheelchair or bedside chair)?: A Little Help needed to walk in hospital room?: A Lot Help needed climbing 3-5 steps with a railing? : Total 6 Click Score: 15    End of Session Equipment Utilized During Treatment: Oxygen Activity Tolerance: Patient limited by pain Patient left: in bed;with call bell/phone within reach;with bed alarm set;with SCD's reapplied;Other (comment)(declined up in chair) Nurse Communication: Mobility status PT  Visit Diagnosis: Muscle weakness (generalized) (M62.81);Other abnormalities of gait and mobility (R26.89);Pain Pain - Right/Left: Right Pain - part of body: Hip     Time: 6962-9528 PT Time Calculation (min) (ACUTE ONLY): 16 min  Charges:  $Therapeutic Exercise: 8-22 mins $Therapeutic Activity: 8-22 mins                     Danielle Dess, PTA 11/19/18, 2:41 PM

## 2018-11-19 NOTE — Care Management (Signed)
#   5.  S/W   DESTINY   @ [facility] #  [phone]   1.LOVENOX  40 MG DAILY  14 SYRINGES COVER-  NON-FORMULARY PRIOR APPROVAL - YES   #   [phone]   2. ENOXAPARIN 40 MG DAILY  14 SYRINGES COVER- YES CO-PAY-  $ 3.60 TIER- 4 DRUG PRIOR APPROVAL- NO  NO DEDUCTIBLE PATIENT HAS LOWE INCOME SUBSIDY  PREFERRED PHARMACY : YES [facility]  AND [facility] M/O  90 DAY SUPPLY FOR M/O  $ 3.60

## 2018-11-19 NOTE — Progress Notes (Signed)
[facility] [place] [facility]   PATIENT NAME: Caitlyn Evans    MR#:  [id number]  DATE OF BIRTH:  03-22-1966  SUBJECTIVE:  Pt came in with increasing right hip pain for few days. Denies fall or any trauma. Found to have right hip fracture. POD# 2 Had spinal fusion surgery about 1 month ago  REVIEW OF SYSTEMS:   Review of Systems  Constitutional: Negative for chills, fever and weight loss.  HENT: Negative for ear discharge, ear pain and nosebleeds.   Eyes: Negative for blurred vision, pain and discharge.  Respiratory: Negative for sputum production, shortness of breath, wheezing and stridor.   Cardiovascular: Negative for chest pain, palpitations, orthopnea and PND.  Gastrointestinal: Negative for abdominal pain, diarrhea, nausea and vomiting.  Genitourinary: Negative for frequency and urgency.  Musculoskeletal: Negative for back pain and joint pain.  Neurological: Negative for sensory change, speech change, focal weakness and weakness.  Psychiatric/Behavioral: Negative for depression and hallucinations. The patient is not nervous/anxious.    Tolerating Diet:yes Tolerating QI:HKVQQ--VZ will go home with hHPT  DRUG ALLERGIES:   Allergies  Allergen Reactions  . Codeine Hives, Nausea And Vomiting and Nausea Only  . Penicillins Hives and Other (See Comments)    Has patient had a PCN reaction causing immediate rash, facial/tongue/throat swelling, SOB or lightheadedness with hypotension: No Has patient had a PCN reaction causing severe rash involving mucus membranes or skin necrosis: No Has patient had a PCN reaction that required hospitalization No Has patient had a PCN reaction occurring within the last 10 years: No If all of the above answers are "NO", then may proceed with Cephalosporin use. Other reaction(s): Other (See Comments) Has patient had a PCN    VITALS:  Blood pressure 105/67, pulse 66, temperature 97.8 F (36.6 C), temperature source  Oral, resp. rate 16, height 4\' 10"  (1.473 m), weight 59 kg, SpO2 100 %.  PHYSICAL EXAMINATION:   Physical Exam  GENERAL:  53 y.o.-year-old patient lying in the bed with no acute distress. Cushingoid face, anxious EYES: Pupils equal, round, reactive to light and accommodation. No scleral icterus. Extraocular muscles intact.  HEENT: Head atraumatic, normocephalic. Oropharynx and nasopharynx clear.  NECK:  Supple, no jugular venous distention. No thyroid enlargement, no tenderness.  LUNGS: distant breath sounds bilaterally, no wheezing, rales, rhonchi. No use of accessory muscles of respiration.  CARDIOVASCULAR: S1, S2 normal. No murmurs, rubs, or gallops.  ABDOMEN: Soft, nontender, nondistended. Bowel sounds present. No organomegaly or mass.  EXTREMITIES: No cyanosis, clubbing or edema b/l.   Decreased ROM right hip NEUROLOGIC: Cranial nerves II through XII are intact. No focal Motor or sensory deficits b/l.   PSYCHIATRIC:  patient is alert and oriented x 3.  SKIN: No obvious rash, lesion, or ulcer.   LABORATORY PANEL:  CBC Recent Labs  Lab 11/19/18 0421  WBC 13.3*  HGB 11.8*  HCT 38.7  PLT 263    Chemistries  Recent Labs  Lab 11/19/18 0421  NA 142  K 4.1  CL 103  CO2 32  GLUCOSE 94  BUN 15  CREATININE 0.44  CALCIUM 8.7*   Cardiac Enzymes Recent Labs  Lab 11/16/18 1320  TROPONINI <0.03   RADIOLOGY:  Dg Hip Operative Unilat W Or W/o Pelvis Right  Result Date: 11/17/2018 CLINICAL DATA:  RIGHT hip IM nail. EXAM: OPERATIVE RIGHT HIP (WITH PELVIS IF PERFORMED) 11 VIEWS TECHNIQUE: Fluoroscopic spot image(s) were submitted for interpretation post-operatively. FLUOROSCOPY TIME:  2.8 minutes. COMPARISON:  CT RIGHT  hip November 16, 2018 FINDINGS: 3 cannulated partially threaded screws transfix femoral neck fracture. IMPRESSION: 1. RIGHT femoral neck ORIF. Electronically Signed   By: Awilda Metro M.D.   On: 11/17/2018 14:47   ASSESSMENT AND PLAN:   53 year old female with  chronic hypoxic respiratory failure due to COPD on 2-1/2 L of oxygen at home and chronic diastolic heart failure presented to the emergency room due to shortness of breath and hip pain.  1. nondisplaced right femoral neck fracture s/p right hip surgery POD # 2 - Pathological likely due to chronic steroid use -DVT prophylaxis as per orthopedic surgery--lovenox -pt will get 1 more day of therapy here and discharge in am--d/w Dr Allena Katz  2.  Acute COPD exacerbation: recieved IV steroids (patient is on chronic steroids at home)--changed to po home dose steroids -Continue nebs and inhalers, incentive spirometer -Doxycycline for acute bronchitis (total 5 days)  3.  Essential hypertension - Continue diltiazem  4.  Chronic diastolic heart failure without signs of exacerbation Monitor intake and output with daily weight Continue Lasix  5.  Recent back surgery with pain: Continue morphine and oxycodone  6.  Hyperlipidemia: Continue statin  PT to be started today. Patient will go to home tomorrow if she remains stable with home health PT RN and aide along with social worker. Patient is agreeable.   CODE STATUS: full  DVT Prophylaxis: lovenox TOTAL TIME TAKING CARE OF THIS PATIENT: *30 minutes.  >50% time spent on counselling and coordination of care  POSSIBLE D/C IN 1 DAYS, DEPENDING ON CLINICAL CONDITION.  Note: This dictation was prepared with Dragon dictation along with smaller phrase technology. Any transcriptional errors that result from this process are unintentional.  Enedina Finner M.D on 11/19/2018 at 8:56 AM  Between 7am to 6pm - Pager - [phone]  After 6pm go to [email or web address] - Social research officer, government  [facility]  Office  [phone]  CC: Primary care physician; Corky Downs, MDPatient ID: Caitlyn Evans, female   DOB: 1966-04-28, 53 y.o.   MRN: [id number]

## 2018-11-19 NOTE — Care Management Important Message (Signed)
Important Message  Patient Details  Name: Caitlyn Evans MRN: [id number] Date of Birth: April 18, 1966   Medicare Important Message Given:  Yes    Olegario Messier A Adalynne Steffensmeier 11/19/2018, 1:21 PM

## 2018-11-19 NOTE — Care Management (Signed)
Sent secure inbox requesting CMA to check the benefit for Lovenox

## 2018-11-19 NOTE — Progress Notes (Signed)
Physical Therapy Treatment Patient Details Name: Caitlyn Evans MRN: [id number] DOB: 1966/04/25 Today's Date: 11/19/2018    History of Present Illness From MD H&P: Pt is a 53 y.o. female with a known history of chronic hypoxic respiratory failure on 2-1/2 to 3 L of oxygen at home due to COPD, chronic diastolic heart failure with preserved ejection fraction and hyperlipidemia who presented to the emergency room due to shortness of breath and hip pain.  Pt stated her shortness of breath is improved however she continues to have hip pain.  She denied fall.  She had recent spinal fusion in February at [facility] and since that time she has been ambulating however over the past several days she was having much difficulty ambulating and she could not bear weight on her RLE.  CT of the right hip showed a femoral neck fracture.  Pt now s/p percutaneous pinning of right femoral neck fracture.  Assessment also includes: acute COPD exacerbation, HTN, HLD, chronic CHF, and hypokalemia.      PT Comments    Pt highly limited by R hip pain this session, nsg aware.  Pt declined all but supine therex but tolerated the below exercises well.  Will attempt to see pt again this date for functional mobility/gait training as tolerated by pt.  Pt will benefit from PT services in a SNF setting upon discharge to safely address above deficits for decreased caregiver assistance and eventual return to PLOF.    Follow Up Recommendations  SNF;Supervision for mobility/OOB     Equipment Recommendations  Other (comment)(Ramp)    Recommendations for Other Services       Precautions / Restrictions Precautions Precautions: Fall;Back Precaution Comments: No back precautions listed in chart review (Sx at [facility]) with lumbar corset brace in the room; standard back precautions assumed during session. Required Braces or Orthoses: Spinal Brace Spinal Brace: Lumbar corset Restrictions Weight Bearing Restrictions: Yes RLE Weight Bearing:  Touchdown weight bearing Other Position/Activity Restrictions: Flat foot TDWB with weight of the leg only    Mobility  Bed Mobility               General bed mobility comments: Pt declined bed mobility training and OOB activities this session secondary to RLE pain  Transfers                    Ambulation/Gait                 Stairs             Wheelchair Mobility    Modified Rankin (Stroke Patients Only)       Balance                                            Cognition Arousal/Alertness: Awake/alert Behavior During Therapy: WFL for tasks assessed/performed Overall Cognitive Status: Within Functional Limits for tasks assessed                                        Exercises Total Joint Exercises Ankle Circles/Pumps: Strengthening;Both;10 reps;15 reps Quad Sets: Strengthening;Both;10 reps;15 reps Gluteal Sets: Strengthening;Both;10 reps;15 reps Short Arc Quad: AROM;Both;10 reps;15 reps Heel Slides: Both;5 reps;AROM Hip ABduction/ADduction: Strengthening;Left;10 reps;5 reps Straight Leg Raises: Strengthening;Left;10 reps;5 reps Other Exercises Other Exercises: HEP review  for BLE APs, QS, GS, and LAQ x 10 each  5-6x/day    General Comments        Pertinent Vitals/Pain Pain Assessment: 0-10 Pain Score: 10-Worst pain ever Pain Location: RLE Pain Descriptors / Indicators: Aching;Sore;Guarding;Moaning Pain Intervention(s): Limited activity within patient's tolerance;Monitored during session;Premedicated before session    Home Living                      Prior Function            PT Goals (current goals can now be found in the care plan section) Progress towards PT goals: Not progressing toward goals - comment(Limited by R hip pain this session)    Frequency    BID      PT Plan Current plan remains appropriate    Co-evaluation              AM-PAC PT "6 Clicks" Mobility    Outcome Measure  Help needed turning from your back to your side while in a flat bed without using bedrails?: A Little Help needed moving from lying on your back to sitting on the side of a flat bed without using bedrails?: A Little Help needed moving to and from a bed to a chair (including a wheelchair)?: A Little Help needed standing up from a chair using your arms (e.g., wheelchair or bedside chair)?: A Little Help needed to walk in hospital room?: A Lot Help needed climbing 3-5 steps with a railing? : Total 6 Click Score: 15    End of Session Equipment Utilized During Treatment: Oxygen Activity Tolerance: Patient tolerated treatment well Patient left: in bed;with call bell/phone within reach;with bed alarm set;with SCD's reapplied;Other (comment)(declined up in chair) Nurse Communication: Mobility status PT Visit Diagnosis: Muscle weakness (generalized) (M62.81);Other abnormalities of gait and mobility (R26.89);Pain Pain - Right/Left: Right Pain - part of body: Hip     Time: 5945-8592 PT Time Calculation (min) (ACUTE ONLY): 15 min  Charges:  $Therapeutic Exercise: 8-22 mins                     D. Scott Aristeo Hankerson PT, DPT 11/19/18, 10:54 AM

## 2018-11-20 LAB — BASIC METABOLIC PANEL
ANION GAP: 6 (ref 5–15)
BUN: 17 mg/dL (ref 6–20)
CALCIUM: 9 mg/dL (ref 8.9–10.3)
CO2: 32 mmol/L (ref 22–32)
Chloride: 106 mmol/L (ref 98–111)
Creatinine, Ser: 0.37 mg/dL — ABNORMAL LOW (ref 0.44–1.00)
GFR calc Af Amer: >60 mL/min (ref >60)
GFR calc non Af Amer: >60 mL/min (ref >60)
Glucose, Bld: 89 mg/dL (ref 70–99)
Potassium: 3.9 mmol/L (ref 3.5–5.1)
Sodium: 144 mmol/L (ref 135–145)

## 2018-11-20 MED ORDER — DOXYCYCLINE HYCLATE 100 MG PO CAPS: 100.0000 mg | ORAL_CAPSULE | Freq: Two times a day (BID) | ORAL | 0 refills | Status: AC

## 2018-11-20 NOTE — Progress Notes (Addendum)
  Subjective: 3 Days Post-Op Procedure(s) (LRB): CANNULATED HIP PINNING (Right) Patient reports pain as mild.   Patient is well, and has had no acute complaints or problems Plan is to go Home after hospital stay. Negative for chest pain and shortness of breath Fever: no Gastrointestinal: Negative for nausea and vomiting  Objective: Vital signs in last 24 hours: Temp:  [97.7 F (36.5 C)-98.3 F (36.8 C)] 97.7 F (36.5 C) (03/05 2247) Pulse Rate:  [66-88] 88 (03/05 2247) Resp:  [16-19] 19 (03/05 2247) BP: (105-115)/(65-75) 109/67 (03/05 2247) SpO2:  [97 %-100 %] 99 % (03/06 0413)  Intake/Output from previous day:  Intake/Output Summary (Last 24 hours) at 11/20/2018 0709 Last data filed at 11/20/2018 0450 Gross per 24 hour  Intake 480 ml  Output 2855 ml  Net -2375 ml    Intake/Output this shift: No intake/output data recorded.  Labs: Recent Labs    11/18/18 0501 11/19/18 0421  HGB 11.4* 11.8*   Recent Labs    11/18/18 0501 11/19/18 0421  WBC 17.9* 13.3*  RBC 3.91 4.05  HCT 37.6 38.7  PLT 268 263   Recent Labs    11/19/18 0421 11/20/18 0520  NA 142 144  K 4.1 3.9  CL 103 106  CO2 32 32  BUN 15 17  CREATININE 0.44 0.37*  GLUCOSE 94 89  CALCIUM 8.7* 9.0   No results for input(s): LABPT, INR in the last 72 hours.   EXAM General - Patient is Alert and Oriented Extremity - Sensation intact distally Intact pulses distally Dorsiflexion/Plantar flexion intact Compartment soft Dressing/Incision - clean, dry, minimal blood tinged drainage Motor Function - intact, moving foot and toes well on exam.    Past Medical History:  Diagnosis Date  . Anxiety    panic attacks  . Asthma   . BOOP (bronchiolitis obliterans with organizing pneumonia) (HCC)   . CHF (congestive heart failure) (HCC)   . Dysphagia, pharyngoesophageal phase 05/30/2015  . Emphysema lung (HCC) 05/30/2015   on steroids and nebulizer for copd issues.  09/15/18  . GERD (gastroesophageal reflux  disease)   . Hypercholesterolemia   . Hypertension   . Lung mass   . Migraines   . Motion sickness    all moving vehicles  . Myocardial infarction (HCC) 2012  . Respiratory failure with hypercapnia (HCC) 08/2018   was hospitalized and on bipap. steroids and nebulizer treatments helped  . Seasonal allergies   . Shortness of breath dyspnea    1 flight-stairs  . Sternal fracture     Assessment/Plan: 3 Days Post-Op Procedure(s) (LRB): CANNULATED HIP PINNING (Right) Active Problems:   Hip fracture (HCC)  Estimated body mass index is 27.17 kg/m as calculated from the following:   Height as of this encounter: 4\' 10"  (1.473 m).   Weight as of this encounter: 59 kg. Advance diet Up with therapy D/C IV fluids Discharge to SNF when cleared by medicine.  Patient making slow progress with PT. Follow-up at [facility] clinic in 2 weeks for staple removal.   DVT Prophylaxis - Lovenox, Foot Pumps and TED hose Toe-touch weight-Bearing  to right leg

## 2018-11-20 NOTE — Progress Notes (Signed)
Discharge instructions and prescriptions given to pt. IV removed. Pt dressed and will discharge home with husband.

## 2018-11-20 NOTE — Discharge Summary (Signed)
[place], 53 y.o., DOB [birth year], MRN [id number]. Admission date: 11/16/2018 Discharge Date 11/20/2018 Primary MD Cletis Athens, MD Admitting Physician Bettey Costa, MD  Admission Diagnosis  COPD exacerbation [facility]) [J44.1] Closed fracture of neck of right femur, initial encounter [facility]) [S72.001A]  Discharge Diagnosis   Active Problems: Nondisplaced right femoral neck fracture Acute on chronic COPD exacerbation Essential hypertension Chronic diastolic CHF Recent back surgery Hyperlipidemia  Hospital Course Patient is 53 year-old female with chronic hypoxic respiratory failure due to COPD on 2-1/2 L of oxygen at home and chronic diastolic heart failure presented to the emergency room due to shortness of breath and hip pain.  She was noted to have nondisplaced right femoral neck fracture.  She will do a repair of this and has tolerated the procedure well.  She has no further nursing home days therefore is being discharged home with home health.  Patient states that she is feeling better denies any chest pain or shortness of breath            Consults  orthopedic surgery  Significant Tests:  See full reports for all details     Ct Hip Right Wo Contrast  Result Date: 11/16/2018 CLINICAL DATA:  Severe right hip pain.  No known injury. EXAM: CT OF THE RIGHT HIP WITHOUT CONTRAST TECHNIQUE: Multidetector CT imaging of the right hip was performed according to the standard protocol. Multiplanar CT image reconstructions were also generated. COMPARISON:  Right hip x-rays from same day. FINDINGS: Bones/Joint/Cartilage Acute, minimally impacted fracture of the right femoral neck. No dislocation. The right hip joint space is preserved. No significant joint effusion. Ligaments Suboptimally assessed by CT. Muscles and Tendons Intact. Soft tissues Unremarkable. IMPRESSION: 1. Acute, minimally impacted fracture of the right femoral neck.  Electronically Signed   By: Titus Dubin M.D.   On: 11/16/2018 17:23   US Venous Img Lower Bilateral  Result Date: 11/16/2018 CLINICAL DATA:  Leg swelling.  History of back surgery. EXAM: BILATERAL LOWER EXTREMITY VENOUS DOPPLER ULTRASOUND TECHNIQUE: Gray-scale sonography with graded compression, as well as color Doppler and duplex ultrasound were performed to evaluate the lower extremity deep venous systems from the level of the common femoral vein and including the common femoral, femoral, profunda femoral, popliteal and calf veins including the posterior tibial, peroneal and gastrocnemius veins when visible. The superficial great saphenous vein was also interrogated. Spectral Doppler was utilized to evaluate flow at rest and with distal augmentation maneuvers in the common femoral, femoral and popliteal veins. COMPARISON:  None. FINDINGS: RIGHT LOWER EXTREMITY Common Femoral Vein: No evidence of thrombus. Normal compressibility, respiratory phasicity and response to augmentation. Saphenofemoral Junction: No evidence of thrombus. Normal compressibility and flow on color Doppler imaging. Profunda Femoral Vein: No evidence of thrombus. Normal compressibility and flow on color Doppler imaging. Femoral Vein: No evidence of thrombus. Normal compressibility, respiratory phasicity and response to augmentation. Popliteal Vein: No evidence of thrombus. Normal compressibility, respiratory phasicity and response to augmentation. Calf Veins: No evidence of thrombus. Normal compressibility and flow on color Doppler imaging. Other Findings:  None. LEFT LOWER EXTREMITY Common Femoral Vein: No evidence of thrombus. Normal compressibility, respiratory phasicity and response to augmentation. Saphenofemoral Junction: No evidence of thrombus. Normal compressibility and flow on color Doppler imaging. Profunda Femoral Vein: No evidence of thrombus. Normal compressibility and flow on color Doppler imaging. Femoral Vein: No  evidence of thrombus. Normal compressibility, respiratory phasicity and response to augmentation. Popliteal Vein: No  evidence of thrombus. Normal compressibility, respiratory phasicity and response to augmentation. Calf Veins: No evidence of thrombus. Normal compressibility and flow on color Doppler imaging. Other Findings:  None. IMPRESSION: No evidence of deep venous thrombosis in the lower extremities. Electronically Signed   By: Markus Daft M.D.   On: 11/16/2018 15:15   Dg Chest Portable 1 View  Result Date: 11/16/2018 CLINICAL DATA:  Shortness of breath since yesterday. History of myocardial infarction, emphysema, LEFT lobectomy. EXAM: PORTABLE CHEST 1 VIEW COMPARISON:  Chest radiograph October 03, 2018 FINDINGS: Cardiomediastinal silhouette is normal. No pleural effusions or focal consolidations. Surgical clips LEFT upper lung zone. Biapical pleural thickening. Trachea projects midline and there is no pneumothorax. Soft tissue planes and included osseous structures are non-suspicious. New cervicothoracic spinal hardware. IMPRESSION: 1. No acute cardiopulmonary process. 2. Postsurgical changes LEFT upper lung zone. Electronically Signed   By: Elon Alas M.D.   On: 11/16/2018 13:43   Dg Hip Operative Unilat W Or W/o Pelvis Right  Result Date: 11/17/2018 CLINICAL DATA:  RIGHT hip IM nail. EXAM: OPERATIVE RIGHT HIP (WITH PELVIS IF PERFORMED) 11 VIEWS TECHNIQUE: Fluoroscopic spot image(s) were submitted for interpretation post-operatively. FLUOROSCOPY TIME:  2.8 minutes. COMPARISON:  CT RIGHT hip November 16, 2018 FINDINGS: 3 cannulated partially threaded screws transfix femoral neck fracture. IMPRESSION: 1. RIGHT femoral neck ORIF. Electronically Signed   By: Elon Alas M.D.   On: 11/17/2018 14:47   Dg Hip Unilat W Or Wo Pelvis 2-3 Views Right  Result Date: 11/16/2018 CLINICAL DATA:  Hip pain EXAM: DG HIP (WITH OR WITHOUT PELVIS) 2-3V RIGHT COMPARISON:  None. FINDINGS: There is cortical  irregularity noted in the femoral neck region. Concern for possible nondisplaced femoral neck fracture. No subluxation or dislocation. IMPRESSION: Concern for subtle nondisplaced right femoral neck fracture. This could be confirmed with CT. Electronically Signed   By: Rolm Baptise M.D.   On: 11/16/2018 16:00       Today   Subjective:   Caitlyn Evans patient doing better denies any complaints  Objective:   Blood pressure 105/68, pulse 83, temperature 97.8 F (36.6 C), temperature source Oral, resp. rate 19, height '4\' 10"'  (1.473 m), weight 59 kg, SpO2 99 %.  .  Intake/Output Summary (Last 24 hours) at 11/20/2018 1447 Last data filed at 11/20/2018 0450 Gross per 24 hour  Intake 240 ml  Output 1455 ml  Net -1215 ml    Exam VITAL SIGNS: Blood pressure 105/68, pulse 83, temperature 97.8 F (36.6 C), temperature source Oral, resp. rate 19, height '4\' 10"'  (1.473 m), weight 59 kg, SpO2 99 %.  GENERAL:  53 y.o.-year-old patient lying in the bed with no acute distress.  EYES: Pupils equal, round, reactive to light and accommodation. No scleral icterus. Extraocular muscles intact.  HEENT: Head atraumatic, normocephalic. Oropharynx and nasopharynx clear.  NECK:  Supple, no jugular venous distention. No thyroid enlargement, no tenderness.  LUNGS: Normal breath sounds bilaterally, no wheezing, rales,rhonchi or crepitation. No use of accessory muscles of respiration.  CARDIOVASCULAR: S1, S2 normal. No murmurs, rubs, or gallops.  ABDOMEN: Soft, nontender, nondistended. Bowel sounds present. No organomegaly or mass.  EXTREMITIES: No pedal edema, cyanosis, or clubbing.  NEUROLOGIC: Cranial nerves II through XII are intact. Muscle strength 5/5 in all extremities. Sensation intact. Gait not checked.  PSYCHIATRIC: The patient is alert and oriented x 3.  SKIN: No obvious rash, lesion, or ulcer.   Data Review     CBC w Diff:  Lab Results  Component  Value Date   WBC 13.3 (H) 11/19/2018   HGB 11.8  (L) 11/19/2018   HGB 14.2 06/22/2013   HCT 38.7 11/19/2018   HCT 42.7 06/22/2013   PLT 263 11/19/2018   PLT 291 06/22/2013   LYMPHOPCT 10 11/16/2018   LYMPHOPCT 6.8 07/23/2012   MONOPCT 11 11/16/2018   MONOPCT 5.7 07/23/2012   EOSPCT 1 11/16/2018   EOSPCT 0.0 07/23/2012   BASOPCT 1 11/16/2018   BASOPCT 0.1 07/23/2012   CMP:  Lab Results  Component Value Date   NA 144 11/20/2018   NA 140 06/22/2013   K 3.9 11/20/2018   K 3.5 06/22/2013   CL 106 11/20/2018   CL 110 (H) 06/22/2013   CO2 32 11/20/2018   CO2 24 06/22/2013   BUN 17 11/20/2018   BUN 16 06/22/2013   CREATININE 0.37 (L) 11/20/2018   CREATININE 0.85 06/22/2013   PROT 7.1 07/16/2016   PROT 7.5 06/22/2013   ALBUMIN 4.0 07/16/2016   ALBUMIN 4.1 06/22/2013   BILITOT 0.6 07/16/2016   BILITOT 0.2 06/22/2013   ALKPHOS 61 07/16/2016   ALKPHOS 107 06/22/2013   AST 20 07/16/2016   AST 33 06/22/2013   ALT 11 (L) 07/16/2016   ALT 54 06/22/2013  .  Micro Results Recent Results (from the past 240 hour(s))  MRSA PCR Screening     Status: None   Collection Time: 11/17/18  8:46 AM  Result Value Ref Range Status   MRSA by PCR NEGATIVE NEGATIVE Final    Comment:        The GeneXpert MRSA Assay (FDA approved for NASAL specimens only), is one component of a comprehensive MRSA colonization surveillance program. It is not intended to diagnose MRSA infection nor to guide or monitor treatment for MRSA infections. Performed at [facility], [address], [place], [place] [address]         Code Status Orders  (From admission, onward)         Start     Ordered   11/16/18 1853  Full code  Continuous     11/16/18 1853        Code Status History    Date Active Date Inactive Code Status Order ID Comments User Context   09/22/2018 2017 09/26/2018 1435 Full Code [id number]  Hillary Bow, MD ED   09/17/2018 1005 09/17/2018 1334 Full Code [id number]  Hessie Knows, MD Inpatient   09/13/2018 1104 09/15/2018  1529 Full Code [id number]  Loletha Grayer, MD ED   09/09/2018 2113 09/10/2018 1939 Full Code [id number]  Mayo, Pete Pelt, MD ED   08/31/2018 1507 09/02/2018 1509 Full Code [id number]  Saundra Shelling, MD ED   11/18/2017 2000 11/25/2017 1802 Full Code [id number]  Saundra Shelling, MD ED   07/01/2017 2023 07/04/2017 1648 Full Code [id number]  Vaughan Basta, MD ED   07/19/2016 1148 07/20/2016 2010 Full Code [id number]  Bettey Costa, MD Inpatient   05/09/2016 1440 05/13/2016 1437 Full Code [id number]  Wilhelmina Mcardle, MD ED   02/05/2016 1914 02/08/2016 1452 Full Code [id number]  Nicholes Mango, MD Inpatient   12/05/2015 2302 12/09/2015 1535 Full Code [id number]  Lance Coon, MD Inpatient    Advance Directive Documentation     Most Recent Value  Type of Advance Directive  Healthcare Power of Attorney, Living will  Pre-existing out of facility DNR order (yellow form or pink MOST form)  -  "MOST" Form in Place?  -          Follow-up Information  Cletis Athens, MD. Schedule an appointment as soon as possible for a visit on 11/23/2018.   Specialty:  Internal Medicine Why:  @ 9:30 am Contact information: [place] [address] [phone]        Reche Dixon, PA-C On 12/04/2018.   Specialty:  Orthopedic Surgery Why:  For staple removal;  @ 1:30 pm Contact information: [address] [place] [address] [phone]           Discharge Medications   Allergies as of 11/20/2018      Reactions   Codeine Hives, Nausea And Vomiting, Nausea Only   Penicillins Hives, Other (See Comments)   Has patient had a PCN reaction causing immediate rash, facial/tongue/throat swelling, SOB or lightheadedness with hypotension: No Has patient had a PCN reaction causing severe rash involving mucus membranes or skin necrosis: No Has patient had a PCN reaction that required hospitalization No Has patient had a PCN reaction occurring within the last 10 years: No If all  of the above answers are "NO", then may proceed with Cephalosporin use. Other reaction(s): Other (See Comments) Has patient had a PCN      Medication List    STOP taking these medications   furosemide 20 MG tablet Commonly known as:  LASIX   Oxaydo 5 MG Taba Generic drug:  OxyCODONE HCl (Abuse Deter) Replaced by:  oxyCODONE 5 MG immediate release tablet   tamsulosin 0.4 MG Caps capsule Commonly known as:  FLOMAX     TAKE these medications   acetaminophen 650 MG CR tablet Commonly known as:  TYLENOL Take 650 mg by mouth every 4 (four) hours as needed for pain.   ALPRAZolam 0.5 MG tablet Commonly known as:  XANAX Take 0.5 mg by mouth 3 (three) times daily.   diltiazem 120 MG 24 hr tablet Commonly known as:  CARDIZEM LA Take 120 mg by mouth daily.   docusate sodium 100 MG capsule Commonly known as:  COLACE Take 1 capsule (100 mg total) by mouth 2 (two) times daily.   donepezil 10 MG tablet Commonly known as:  ARICEPT Take 10 mg by mouth daily.   doxycycline 100 MG capsule Commonly known as:  VIBRAMYCIN Take 1 capsule (100 mg total) by mouth 2 (two) times daily for 7 days.   enoxaparin 40 MG/0.4ML injection Commonly known as:  LOVENOX Inject 0.4 mLs (40 mg total) into the skin daily.   guaiFENesin 100 MG/5ML Soln Commonly known as:  ROBITUSSIN Take 5 mLs (100 mg total) by mouth every 4 (four) hours as needed for cough or to loosen phlegm.   ipratropium-albuterol 0.5-2.5 (3) MG/3ML Soln Commonly known as:  DUONEB Inhale 3 mLs into the lungs every 4 (four) hours as needed (for wheezing/shortness of breath). What changed:    reasons to take this  additional instructions   lactulose 10 GM/15ML solution Commonly known as:  CHRONULAC Take 30 mLs (20 g total) by mouth 2 (two) times daily as needed for mild constipation.   lidocaine 5 % Commonly known as:  LIDODERM Place 1 patch onto the skin daily. Remove & Discard patch within 12 hours or as directed by MD    methocarbamol 750 MG tablet Commonly known as:  ROBAXIN Take 750 mg by mouth every 8 (eight) hours as needed for muscle spasms.   metoprolol tartrate 25 MG tablet Commonly known as:  LOPRESSOR Take 1 tablet (25 mg total) by mouth 2 (two) times daily.   morphine 15 MG  12 hr tablet Commonly known as:  MS CONTIN Take 15 mg by mouth every 12 (twelve) hours.   naloxone 4 MG/0.1ML Liqd nasal spray kit Commonly known as:  NARCAN Place 1 spray into the nose once.   oxyCODONE 5 MG immediate release tablet Commonly known as:  Oxy IR/ROXICODONE Take 1 tablet (5 mg total) by mouth every 4 (four) hours as needed for severe pain (pain score 7-10). Replaces:  Oxaydo 5 MG Taba   pantoprazole 40 MG tablet Commonly known as:  PROTONIX TAKE ONE TABLET BY MOUTH TWICE DAILY   polyethylene glycol packet Commonly known as:  MIRALAX / GLYCOLAX Take 17 g by mouth daily.   predniSONE 10 MG tablet Commonly known as:  DELTASONE Take 10 mg by mouth 2 (two) times daily with a meal.   pregabalin 100 MG capsule Commonly known as:  LYRICA Take 100 mg by mouth 2 (two) times daily.   rosuvastatin 10 MG tablet Commonly known as:  CRESTOR Take 10 mg by mouth daily.   senna-docusate 8.6-50 MG tablet Commonly known as:  Senokot-S Take 2 tablets by mouth 2 (two) times daily.   traMADol 50 MG tablet Commonly known as:  ULTRAM Take 1 tablet (50 mg total) by mouth every 6 (six) hours as needed for moderate pain.   vitamin C 1000 MG tablet Take 1,000 mg by mouth daily.            Durable Medical Equipment  (From admission, onward)         Start     Ordered   11/18/18 0930  For home use only DME Bedside commode  Once    Question:  Patient needs a bedside commode to treat with the following condition  Answer:  Hip fracture ([place])   11/18/18 0930             Total Time in preparing paper work, data evaluation and todays exam - 18 minutes  Dustin Flock M.D on 11/20/2018 at 2:47 PM [place]  [phone]

## 2018-11-20 NOTE — Progress Notes (Signed)
Pt unable to have a BM but is passing gas. Pt refusing all extra BM medications. MD Allena Katz made aware and is ok with pt discharging.

## 2018-11-20 NOTE — Progress Notes (Signed)
Physical Therapy Treatment Patient Details Name: Caitlyn Evans MRN: [id number] DOB: 06-04-1966 Today's Date: 11/20/2018    History of Present Illness From MD H&P: Pt is a 53 y.o. female with a known history of chronic hypoxic respiratory failure on 2-1/2 to 3 L of oxygen at home due to COPD, chronic diastolic heart failure with preserved ejection fraction and hyperlipidemia who presented to the emergency room due to shortness of breath and hip pain.  Pt stated her shortness of breath is improved however she continues to have hip pain.  She denied fall.  She had recent spinal fusion in February at [facility] and since that time she has been ambulating however over the past several days she was having much difficulty ambulating and she could not bear weight on her RLE.  CT of the right hip showed a femoral neck fracture.  Pt now s/p percutaneous pinning of right femoral neck fracture.  Assessment also includes: acute COPD exacerbation, HTN, HLD, chronic CHF, and hypokalemia.      PT Comments    Pt presents with deficits in strength, transfers, mobility, gait, balance, and activity tolerance.  Pt was min A with bed mobility tasks and CGA with transfers with cues for both to ensure back precautions and RLE WB status compliance.  Pt was able to amb 3 feet with CGA and min verbal/tactile cues for sequencing for WB compliance.  Pt will benefit from PT services in a SNF setting upon discharge to safely address above deficits for decreased caregiver assistance and eventual return to PLOF.     Follow Up Recommendations  SNF;Supervision for mobility/OOB     Equipment Recommendations  Other (comment)(Ramp)    Recommendations for Other Services       Precautions / Restrictions Precautions Precautions: Fall;Back Precaution Comments: No back precautions listed in chart review (Sx at [facility]) with lumbar corset brace in the room; standard back precautions assumed during session. Required Braces or Orthoses:  Spinal Brace Spinal Brace: Lumbar corset Restrictions Weight Bearing Restrictions: Yes RLE Weight Bearing: Touchdown weight bearing Other Position/Activity Restrictions: Flat foot TDWB with weight of the leg only    Mobility  Bed Mobility Overal bed mobility: Needs Assistance Bed Mobility: Sit to Sidelying;Sidelying to Sit   Sidelying to sit: Min assist     Sit to sidelying: Min assist General bed mobility comments: Min A for BLEs in and out of bed with log roll training  Transfers Overall transfer level: Needs assistance Equipment used: Rolling walker (2 wheeled) Transfers: Sit to/from Stand Sit to Stand: Min guard         General transfer comment: Min verbal cues for sequencing for back precaution and RLE WB status compliance  Ambulation/Gait Ambulation/Gait assistance: Min guard Gait Distance (Feet): 3 Feet Assistive device: Rolling walker (2 wheeled) Gait Pattern/deviations: Step-to pattern;Antalgic Gait velocity: decreased   General Gait Details: Mod verbal and tactile cues for sequencing to ensure WB compliance   Stairs             Wheelchair Mobility    Modified Rankin (Stroke Patients Only)       Balance Overall balance assessment: Needs assistance Sitting-balance support: Feet supported Sitting balance-Leahy Scale: Good     Standing balance support: Bilateral upper extremity supported Standing balance-Leahy Scale: Fair                              Cognition Arousal/Alertness: Awake/alert Behavior During Therapy: WFL for tasks  assessed/performed Overall Cognitive Status: Within Functional Limits for tasks assessed                                        Exercises Total Joint Exercises Ankle Circles/Pumps: Strengthening;Both;10 reps;15 reps Quad Sets: Strengthening;Both;10 reps;15 reps Gluteal Sets: Strengthening;Both;10 reps;15 reps Short Arc Quad: AROM;Both;10 reps Heel Slides: Both;5 reps;AROM Long Arc  Quad: Strengthening;Both;10 reps;15 reps Knee Flexion: Strengthening;Both;10 reps;15 reps Other Exercises Other Exercises: Standing RLE SLR x 10 Other Exercises: Log roll training practice/review with back precaution review Other Exercises: Back precaution review for no bending, arching, lifting, twisting    General Comments        Pertinent Vitals/Pain Pain Assessment: 0-10 Pain Score: 5  Pain Location: RLE Pain Descriptors / Indicators: Aching;Sore Pain Intervention(s): Premedicated before session;Monitored during session    Home Living                      Prior Function            PT Goals (current goals can now be found in the care plan section) Progress towards PT goals: Progressing toward goals    Frequency    BID      PT Plan Current plan remains appropriate    Co-evaluation              AM-PAC PT "6 Clicks" Mobility   Outcome Measure  Help needed turning from your back to your side while in a flat bed without using bedrails?: A Little Help needed moving from lying on your back to sitting on the side of a flat bed without using bedrails?: A Little Help needed moving to and from a bed to a chair (including a wheelchair)?: A Little Help needed standing up from a chair using your arms (e.g., wheelchair or bedside chair)?: A Little Help needed to walk in hospital room?: A Lot Help needed climbing 3-5 steps with a railing? : Total 6 Click Score: 15    End of Session Equipment Utilized During Treatment: Oxygen Activity Tolerance: Patient limited by pain Patient left: in bed;with call bell/phone within reach;with bed alarm set;with SCD's reapplied;Other (comment)(Pt declined up in chair) Nurse Communication: Mobility status PT Visit Diagnosis: Muscle weakness (generalized) (M62.81);Other abnormalities of gait and mobility (R26.89);Pain Pain - Right/Left: Right Pain - part of body: Hip     Time: 0349-6116 PT Time Calculation (min) (ACUTE  ONLY): 25 min  Charges:  $Therapeutic Exercise: 8-22 mins $Therapeutic Activity: 8-22 mins                     D. Scott Makhari Dovidio PT, DPT 11/20/18, 11:24 AM

## 2018-11-21 DIAGNOSIS — J9612 Chronic respiratory failure with hypercapnia: Secondary | ICD-10-CM | POA: Diagnosis not present

## 2018-11-21 DIAGNOSIS — S22050D Wedge compression fracture of T5-T6 vertebra, subsequent encounter for fracture with routine healing: Secondary | ICD-10-CM | POA: Diagnosis not present

## 2018-11-21 DIAGNOSIS — W19XXXD Unspecified fall, subsequent encounter: Secondary | ICD-10-CM | POA: Diagnosis not present

## 2018-11-21 DIAGNOSIS — I2510 Atherosclerotic heart disease of native coronary artery without angina pectoris: Secondary | ICD-10-CM | POA: Diagnosis not present

## 2018-11-21 DIAGNOSIS — I5032 Chronic diastolic (congestive) heart failure: Secondary | ICD-10-CM | POA: Diagnosis not present

## 2018-11-21 DIAGNOSIS — Z9981 Dependence on supplemental oxygen: Secondary | ICD-10-CM | POA: Diagnosis not present

## 2018-11-21 DIAGNOSIS — E785 Hyperlipidemia, unspecified: Secondary | ICD-10-CM | POA: Diagnosis not present

## 2018-11-21 DIAGNOSIS — K219 Gastro-esophageal reflux disease without esophagitis: Secondary | ICD-10-CM | POA: Diagnosis not present

## 2018-11-21 DIAGNOSIS — I110 Hypertensive heart disease with heart failure: Secondary | ICD-10-CM | POA: Diagnosis not present

## 2018-11-21 DIAGNOSIS — Z7952 Long term (current) use of systemic steroids: Secondary | ICD-10-CM | POA: Diagnosis not present

## 2018-11-21 DIAGNOSIS — Z79891 Long term (current) use of opiate analgesic: Secondary | ICD-10-CM | POA: Diagnosis not present

## 2018-11-21 DIAGNOSIS — J441 Chronic obstructive pulmonary disease with (acute) exacerbation: Secondary | ICD-10-CM | POA: Diagnosis not present

## 2018-11-21 DIAGNOSIS — I252 Old myocardial infarction: Secondary | ICD-10-CM | POA: Diagnosis not present

## 2018-11-21 DIAGNOSIS — R1314 Dysphagia, pharyngoesophageal phase: Secondary | ICD-10-CM | POA: Diagnosis not present

## 2018-11-21 DIAGNOSIS — J9611 Chronic respiratory failure with hypoxia: Secondary | ICD-10-CM | POA: Diagnosis not present

## 2018-11-23 DIAGNOSIS — G43919 Migraine, unspecified, intractable, without status migrainosus: Secondary | ICD-10-CM | POA: Diagnosis not present

## 2018-11-23 DIAGNOSIS — J439 Emphysema, unspecified: Secondary | ICD-10-CM | POA: Diagnosis not present

## 2018-11-23 DIAGNOSIS — M549 Dorsalgia, unspecified: Secondary | ICD-10-CM | POA: Diagnosis not present

## 2018-11-23 DIAGNOSIS — K222 Esophageal obstruction: Secondary | ICD-10-CM | POA: Diagnosis not present

## 2018-11-24 DIAGNOSIS — G8918 Other acute postprocedural pain: Secondary | ICD-10-CM | POA: Diagnosis not present

## 2018-11-25 DIAGNOSIS — I5032 Chronic diastolic (congestive) heart failure: Secondary | ICD-10-CM | POA: Diagnosis not present

## 2018-11-25 DIAGNOSIS — W19XXXD Unspecified fall, subsequent encounter: Secondary | ICD-10-CM | POA: Diagnosis not present

## 2018-11-25 DIAGNOSIS — R1314 Dysphagia, pharyngoesophageal phase: Secondary | ICD-10-CM | POA: Diagnosis not present

## 2018-11-25 DIAGNOSIS — E785 Hyperlipidemia, unspecified: Secondary | ICD-10-CM | POA: Diagnosis not present

## 2018-11-25 DIAGNOSIS — S22050D Wedge compression fracture of T5-T6 vertebra, subsequent encounter for fracture with routine healing: Secondary | ICD-10-CM | POA: Diagnosis not present

## 2018-11-25 DIAGNOSIS — Z79891 Long term (current) use of opiate analgesic: Secondary | ICD-10-CM | POA: Diagnosis not present

## 2018-11-25 DIAGNOSIS — I2510 Atherosclerotic heart disease of native coronary artery without angina pectoris: Secondary | ICD-10-CM | POA: Diagnosis not present

## 2018-11-25 DIAGNOSIS — Z9981 Dependence on supplemental oxygen: Secondary | ICD-10-CM | POA: Diagnosis not present

## 2018-11-25 DIAGNOSIS — I110 Hypertensive heart disease with heart failure: Secondary | ICD-10-CM | POA: Diagnosis not present

## 2018-11-25 DIAGNOSIS — J441 Chronic obstructive pulmonary disease with (acute) exacerbation: Secondary | ICD-10-CM | POA: Diagnosis not present

## 2018-11-25 DIAGNOSIS — Z7952 Long term (current) use of systemic steroids: Secondary | ICD-10-CM | POA: Diagnosis not present

## 2018-11-25 DIAGNOSIS — J9611 Chronic respiratory failure with hypoxia: Secondary | ICD-10-CM | POA: Diagnosis not present

## 2018-11-25 DIAGNOSIS — I252 Old myocardial infarction: Secondary | ICD-10-CM | POA: Diagnosis not present

## 2018-11-25 DIAGNOSIS — J9612 Chronic respiratory failure with hypercapnia: Secondary | ICD-10-CM | POA: Diagnosis not present

## 2018-11-25 DIAGNOSIS — K219 Gastro-esophageal reflux disease without esophagitis: Secondary | ICD-10-CM | POA: Diagnosis not present

## 2018-11-27 DIAGNOSIS — W19XXXD Unspecified fall, subsequent encounter: Secondary | ICD-10-CM | POA: Diagnosis not present

## 2018-11-27 DIAGNOSIS — I252 Old myocardial infarction: Secondary | ICD-10-CM | POA: Diagnosis not present

## 2018-11-27 DIAGNOSIS — I110 Hypertensive heart disease with heart failure: Secondary | ICD-10-CM | POA: Diagnosis not present

## 2018-11-27 DIAGNOSIS — J441 Chronic obstructive pulmonary disease with (acute) exacerbation: Secondary | ICD-10-CM | POA: Diagnosis not present

## 2018-11-27 DIAGNOSIS — I5032 Chronic diastolic (congestive) heart failure: Secondary | ICD-10-CM | POA: Diagnosis not present

## 2018-11-27 DIAGNOSIS — K219 Gastro-esophageal reflux disease without esophagitis: Secondary | ICD-10-CM | POA: Diagnosis not present

## 2018-11-27 DIAGNOSIS — Z7952 Long term (current) use of systemic steroids: Secondary | ICD-10-CM | POA: Diagnosis not present

## 2018-11-27 DIAGNOSIS — Z79891 Long term (current) use of opiate analgesic: Secondary | ICD-10-CM | POA: Diagnosis not present

## 2018-11-27 DIAGNOSIS — J9611 Chronic respiratory failure with hypoxia: Secondary | ICD-10-CM | POA: Diagnosis not present

## 2018-11-27 DIAGNOSIS — E785 Hyperlipidemia, unspecified: Secondary | ICD-10-CM | POA: Diagnosis not present

## 2018-11-27 DIAGNOSIS — J9612 Chronic respiratory failure with hypercapnia: Secondary | ICD-10-CM | POA: Diagnosis not present

## 2018-11-27 DIAGNOSIS — Z9981 Dependence on supplemental oxygen: Secondary | ICD-10-CM | POA: Diagnosis not present

## 2018-11-27 DIAGNOSIS — I2510 Atherosclerotic heart disease of native coronary artery without angina pectoris: Secondary | ICD-10-CM | POA: Diagnosis not present

## 2018-11-27 DIAGNOSIS — R1314 Dysphagia, pharyngoesophageal phase: Secondary | ICD-10-CM | POA: Diagnosis not present

## 2018-11-27 DIAGNOSIS — S22050D Wedge compression fracture of T5-T6 vertebra, subsequent encounter for fracture with routine healing: Secondary | ICD-10-CM | POA: Diagnosis not present

## 2018-11-30 DIAGNOSIS — R1314 Dysphagia, pharyngoesophageal phase: Secondary | ICD-10-CM | POA: Diagnosis not present

## 2018-11-30 DIAGNOSIS — K219 Gastro-esophageal reflux disease without esophagitis: Secondary | ICD-10-CM | POA: Diagnosis not present

## 2018-11-30 DIAGNOSIS — S22050D Wedge compression fracture of T5-T6 vertebra, subsequent encounter for fracture with routine healing: Secondary | ICD-10-CM | POA: Diagnosis not present

## 2018-11-30 DIAGNOSIS — Z7952 Long term (current) use of systemic steroids: Secondary | ICD-10-CM | POA: Diagnosis not present

## 2018-11-30 DIAGNOSIS — J441 Chronic obstructive pulmonary disease with (acute) exacerbation: Secondary | ICD-10-CM | POA: Diagnosis not present

## 2018-11-30 DIAGNOSIS — E785 Hyperlipidemia, unspecified: Secondary | ICD-10-CM | POA: Diagnosis not present

## 2018-11-30 DIAGNOSIS — W19XXXD Unspecified fall, subsequent encounter: Secondary | ICD-10-CM | POA: Diagnosis not present

## 2018-11-30 DIAGNOSIS — I252 Old myocardial infarction: Secondary | ICD-10-CM | POA: Diagnosis not present

## 2018-11-30 DIAGNOSIS — I2510 Atherosclerotic heart disease of native coronary artery without angina pectoris: Secondary | ICD-10-CM | POA: Diagnosis not present

## 2018-11-30 DIAGNOSIS — J9611 Chronic respiratory failure with hypoxia: Secondary | ICD-10-CM | POA: Diagnosis not present

## 2018-11-30 DIAGNOSIS — Z79891 Long term (current) use of opiate analgesic: Secondary | ICD-10-CM | POA: Diagnosis not present

## 2018-11-30 DIAGNOSIS — I5032 Chronic diastolic (congestive) heart failure: Secondary | ICD-10-CM | POA: Diagnosis not present

## 2018-11-30 DIAGNOSIS — I110 Hypertensive heart disease with heart failure: Secondary | ICD-10-CM | POA: Diagnosis not present

## 2018-11-30 DIAGNOSIS — J9612 Chronic respiratory failure with hypercapnia: Secondary | ICD-10-CM | POA: Diagnosis not present

## 2018-11-30 DIAGNOSIS — Z9981 Dependence on supplemental oxygen: Secondary | ICD-10-CM | POA: Diagnosis not present

## 2018-12-02 ENCOUNTER — Other Ambulatory Visit: Payer: Self-pay | Admitting: *Deleted

## 2018-12-02 NOTE — Patient Outreach (Addendum)
[facility]) Care Management  12/02/2018  Raynie VANITA GENSEMER [birth year] [id number]   Initial telephone screening call  Ref[facility] received from [facility]  Referral reason : Noted recent hospital admission 3/2-3/6 Dx: COPD exacerbation , closed fracture of right femur with pinning of femoral neck fracture.  Insurance : [employer] reviewed for [facility], that is significant for COPD on 2-21/ liters of oxygen  HTN, Chronic diastolic heart failure, recent back surgery .  Noted admissions:     Successful outreach call to patient, explained reason for the call and [facility] care management services, benefits. Patient declines further conversation, stating that she is really not interested in participating, I offered to call at another time.Again explained disciplines involved  pharmacist, social workers nurse case Production designer, theatre/television/film as needed that work together with her PCP at  no cost . Reports that she is still  having home therapy visits.   Patient declined to discuss  any further assessment information .   [facility] to [facility], spoke with [facility],  verified that patient still is active with home physical therapy, she had visit this week and planned next visit on 3/18.   Addendum  Patient was explained the difference between home health and [facility] care management , and our services do no interfere with home health services, patient declined return call at a later time.   Plan Will send outreach letter to patient, encouraged to contact [facility] care management if she reconsiders.  Will send PCP outreach letter to notify patient has declined services.   Egbert Garibaldi, RN, [facility] Care Management,Care Management Coordinator  [phone]- Mobile [phone]- Toll Free Main Office

## 2018-12-04 DIAGNOSIS — W19XXXD Unspecified fall, subsequent encounter: Secondary | ICD-10-CM | POA: Diagnosis not present

## 2018-12-04 DIAGNOSIS — J9611 Chronic respiratory failure with hypoxia: Secondary | ICD-10-CM | POA: Diagnosis not present

## 2018-12-04 DIAGNOSIS — I252 Old myocardial infarction: Secondary | ICD-10-CM | POA: Diagnosis not present

## 2018-12-04 DIAGNOSIS — I2510 Atherosclerotic heart disease of native coronary artery without angina pectoris: Secondary | ICD-10-CM | POA: Diagnosis not present

## 2018-12-04 DIAGNOSIS — I110 Hypertensive heart disease with heart failure: Secondary | ICD-10-CM | POA: Diagnosis not present

## 2018-12-04 DIAGNOSIS — J9612 Chronic respiratory failure with hypercapnia: Secondary | ICD-10-CM | POA: Diagnosis not present

## 2018-12-04 DIAGNOSIS — Z79891 Long term (current) use of opiate analgesic: Secondary | ICD-10-CM | POA: Diagnosis not present

## 2018-12-04 DIAGNOSIS — R0602 Shortness of breath: Secondary | ICD-10-CM | POA: Diagnosis not present

## 2018-12-04 DIAGNOSIS — R1314 Dysphagia, pharyngoesophageal phase: Secondary | ICD-10-CM | POA: Diagnosis not present

## 2018-12-04 DIAGNOSIS — Z9981 Dependence on supplemental oxygen: Secondary | ICD-10-CM | POA: Diagnosis not present

## 2018-12-04 DIAGNOSIS — Z7952 Long term (current) use of systemic steroids: Secondary | ICD-10-CM | POA: Diagnosis not present

## 2018-12-04 DIAGNOSIS — S22050D Wedge compression fracture of T5-T6 vertebra, subsequent encounter for fracture with routine healing: Secondary | ICD-10-CM | POA: Diagnosis not present

## 2018-12-04 DIAGNOSIS — K219 Gastro-esophageal reflux disease without esophagitis: Secondary | ICD-10-CM | POA: Diagnosis not present

## 2018-12-04 DIAGNOSIS — E785 Hyperlipidemia, unspecified: Secondary | ICD-10-CM | POA: Diagnosis not present

## 2018-12-04 DIAGNOSIS — I5032 Chronic diastolic (congestive) heart failure: Secondary | ICD-10-CM | POA: Diagnosis not present

## 2018-12-04 DIAGNOSIS — J441 Chronic obstructive pulmonary disease with (acute) exacerbation: Secondary | ICD-10-CM | POA: Diagnosis not present

## 2018-12-04 DIAGNOSIS — J9601 Acute respiratory failure with hypoxia: Secondary | ICD-10-CM | POA: Diagnosis not present

## 2018-12-04 DIAGNOSIS — J449 Chronic obstructive pulmonary disease, unspecified: Secondary | ICD-10-CM | POA: Diagnosis not present

## 2018-12-10 DIAGNOSIS — G43919 Migraine, unspecified, intractable, without status migrainosus: Secondary | ICD-10-CM | POA: Diagnosis not present

## 2018-12-10 DIAGNOSIS — K222 Esophageal obstruction: Secondary | ICD-10-CM | POA: Diagnosis not present

## 2018-12-10 DIAGNOSIS — J449 Chronic obstructive pulmonary disease, unspecified: Secondary | ICD-10-CM | POA: Diagnosis not present

## 2018-12-19 DIAGNOSIS — J449 Chronic obstructive pulmonary disease, unspecified: Secondary | ICD-10-CM | POA: Diagnosis not present

## 2018-12-19 DIAGNOSIS — J9601 Acute respiratory failure with hypoxia: Secondary | ICD-10-CM | POA: Diagnosis not present

## 2018-12-19 DIAGNOSIS — J441 Chronic obstructive pulmonary disease with (acute) exacerbation: Secondary | ICD-10-CM | POA: Diagnosis not present

## 2018-12-19 DIAGNOSIS — R062 Wheezing: Secondary | ICD-10-CM | POA: Diagnosis not present

## 2018-12-21 DIAGNOSIS — R062 Wheezing: Secondary | ICD-10-CM | POA: Diagnosis not present

## 2018-12-21 DIAGNOSIS — J9601 Acute respiratory failure with hypoxia: Secondary | ICD-10-CM | POA: Diagnosis not present

## 2018-12-21 DIAGNOSIS — J441 Chronic obstructive pulmonary disease with (acute) exacerbation: Secondary | ICD-10-CM | POA: Diagnosis not present

## 2018-12-21 DIAGNOSIS — J449 Chronic obstructive pulmonary disease, unspecified: Secondary | ICD-10-CM | POA: Diagnosis not present

## 2019-01-04 DIAGNOSIS — J9601 Acute respiratory failure with hypoxia: Secondary | ICD-10-CM | POA: Diagnosis not present

## 2019-01-04 DIAGNOSIS — J441 Chronic obstructive pulmonary disease with (acute) exacerbation: Secondary | ICD-10-CM | POA: Diagnosis not present

## 2019-01-04 DIAGNOSIS — R0602 Shortness of breath: Secondary | ICD-10-CM | POA: Diagnosis not present

## 2019-01-04 DIAGNOSIS — J449 Chronic obstructive pulmonary disease, unspecified: Secondary | ICD-10-CM | POA: Diagnosis not present

## 2019-01-11 DIAGNOSIS — Z87891 Personal history of nicotine dependence: Secondary | ICD-10-CM | POA: Diagnosis not present

## 2019-01-11 DIAGNOSIS — W1839XS Other fall on same level, sequela: Secondary | ICD-10-CM | POA: Diagnosis not present

## 2019-01-11 DIAGNOSIS — S22050S Wedge compression fracture of T5-T6 vertebra, sequela: Secondary | ICD-10-CM | POA: Diagnosis not present

## 2019-01-11 DIAGNOSIS — S24101A Unspecified injury at T1 level of thoracic spinal cord, initial encounter: Secondary | ICD-10-CM | POA: Diagnosis not present

## 2019-01-11 DIAGNOSIS — R2689 Other abnormalities of gait and mobility: Secondary | ICD-10-CM | POA: Diagnosis not present

## 2019-01-11 DIAGNOSIS — M4324 Fusion of spine, thoracic region: Secondary | ICD-10-CM | POA: Diagnosis not present

## 2019-01-11 DIAGNOSIS — Z4789 Encounter for other orthopedic aftercare: Secondary | ICD-10-CM | POA: Diagnosis not present

## 2019-01-11 DIAGNOSIS — M4854XD Collapsed vertebra, not elsewhere classified, thoracic region, subsequent encounter for fracture with routine healing: Secondary | ICD-10-CM | POA: Diagnosis not present

## 2019-01-13 DIAGNOSIS — S24101A Unspecified injury at T1 level of thoracic spinal cord, initial encounter: Secondary | ICD-10-CM | POA: Diagnosis not present

## 2019-01-13 DIAGNOSIS — J449 Chronic obstructive pulmonary disease, unspecified: Secondary | ICD-10-CM | POA: Diagnosis not present

## 2019-01-13 DIAGNOSIS — M4324 Fusion of spine, thoracic region: Secondary | ICD-10-CM | POA: Diagnosis not present

## 2019-01-14 DIAGNOSIS — S72001D Fracture of unspecified part of neck of right femur, subsequent encounter for closed fracture with routine healing: Secondary | ICD-10-CM | POA: Diagnosis not present

## 2019-01-18 DIAGNOSIS — J9601 Acute respiratory failure with hypoxia: Secondary | ICD-10-CM | POA: Diagnosis not present

## 2019-01-18 DIAGNOSIS — J441 Chronic obstructive pulmonary disease with (acute) exacerbation: Secondary | ICD-10-CM | POA: Diagnosis not present

## 2019-01-18 DIAGNOSIS — K209 Esophagitis, unspecified: Secondary | ICD-10-CM | POA: Diagnosis not present

## 2019-01-18 DIAGNOSIS — R062 Wheezing: Secondary | ICD-10-CM | POA: Diagnosis not present

## 2019-01-18 DIAGNOSIS — R1310 Dysphagia, unspecified: Secondary | ICD-10-CM | POA: Diagnosis not present

## 2019-01-18 DIAGNOSIS — J449 Chronic obstructive pulmonary disease, unspecified: Secondary | ICD-10-CM | POA: Diagnosis not present

## 2019-01-20 DIAGNOSIS — J449 Chronic obstructive pulmonary disease, unspecified: Secondary | ICD-10-CM | POA: Diagnosis not present

## 2019-01-20 DIAGNOSIS — R062 Wheezing: Secondary | ICD-10-CM | POA: Diagnosis not present

## 2019-01-20 DIAGNOSIS — J441 Chronic obstructive pulmonary disease with (acute) exacerbation: Secondary | ICD-10-CM | POA: Diagnosis not present

## 2019-01-20 DIAGNOSIS — J9601 Acute respiratory failure with hypoxia: Secondary | ICD-10-CM | POA: Diagnosis not present

## 2019-01-28 DIAGNOSIS — J439 Emphysema, unspecified: Secondary | ICD-10-CM | POA: Diagnosis not present

## 2019-01-28 DIAGNOSIS — J449 Chronic obstructive pulmonary disease, unspecified: Secondary | ICD-10-CM | POA: Diagnosis not present

## 2019-01-28 DIAGNOSIS — I119 Hypertensive heart disease without heart failure: Secondary | ICD-10-CM | POA: Diagnosis not present

## 2019-01-28 DIAGNOSIS — K222 Esophageal obstruction: Secondary | ICD-10-CM | POA: Diagnosis not present

## 2019-01-29 DIAGNOSIS — Z01818 Encounter for other preprocedural examination: Secondary | ICD-10-CM | POA: Diagnosis not present

## 2019-01-29 DIAGNOSIS — Z1159 Encounter for screening for other viral diseases: Secondary | ICD-10-CM | POA: Diagnosis not present

## 2019-01-29 DIAGNOSIS — R1310 Dysphagia, unspecified: Secondary | ICD-10-CM | POA: Diagnosis not present

## 2019-02-01 DIAGNOSIS — K439 Ventral hernia without obstruction or gangrene: Secondary | ICD-10-CM | POA: Diagnosis not present

## 2019-02-01 DIAGNOSIS — Z7982 Long term (current) use of aspirin: Secondary | ICD-10-CM | POA: Diagnosis not present

## 2019-02-01 DIAGNOSIS — I110 Hypertensive heart disease with heart failure: Secondary | ICD-10-CM | POA: Diagnosis not present

## 2019-02-01 DIAGNOSIS — M1710 Unilateral primary osteoarthritis, unspecified knee: Secondary | ICD-10-CM | POA: Diagnosis not present

## 2019-02-01 DIAGNOSIS — K3189 Other diseases of stomach and duodenum: Secondary | ICD-10-CM | POA: Diagnosis not present

## 2019-02-01 DIAGNOSIS — Z79899 Other long term (current) drug therapy: Secondary | ICD-10-CM | POA: Diagnosis not present

## 2019-02-01 DIAGNOSIS — I2510 Atherosclerotic heart disease of native coronary artery without angina pectoris: Secondary | ICD-10-CM | POA: Diagnosis not present

## 2019-02-01 DIAGNOSIS — K449 Diaphragmatic hernia without obstruction or gangrene: Secondary | ICD-10-CM | POA: Diagnosis not present

## 2019-02-01 DIAGNOSIS — K279 Peptic ulcer, site unspecified, unspecified as acute or chronic, without hemorrhage or perforation: Secondary | ICD-10-CM | POA: Diagnosis not present

## 2019-02-01 DIAGNOSIS — J8489 Other specified interstitial pulmonary diseases: Secondary | ICD-10-CM | POA: Diagnosis not present

## 2019-02-01 DIAGNOSIS — K208 Other esophagitis: Secondary | ICD-10-CM | POA: Diagnosis not present

## 2019-02-01 DIAGNOSIS — Z880 Allergy status to penicillin: Secondary | ICD-10-CM | POA: Diagnosis not present

## 2019-02-01 DIAGNOSIS — Z885 Allergy status to narcotic agent status: Secondary | ICD-10-CM | POA: Diagnosis not present

## 2019-02-01 DIAGNOSIS — G300 Alzheimer's disease with early onset: Secondary | ICD-10-CM | POA: Diagnosis not present

## 2019-02-01 DIAGNOSIS — K209 Esophagitis, unspecified: Secondary | ICD-10-CM | POA: Diagnosis not present

## 2019-02-01 DIAGNOSIS — Z87891 Personal history of nicotine dependence: Secondary | ICD-10-CM | POA: Diagnosis not present

## 2019-02-01 DIAGNOSIS — I252 Old myocardial infarction: Secondary | ICD-10-CM | POA: Diagnosis not present

## 2019-02-01 DIAGNOSIS — K228 Other specified diseases of esophagus: Secondary | ICD-10-CM | POA: Diagnosis not present

## 2019-02-01 DIAGNOSIS — R1319 Other dysphagia: Secondary | ICD-10-CM | POA: Diagnosis not present

## 2019-02-01 DIAGNOSIS — Z7951 Long term (current) use of inhaled steroids: Secondary | ICD-10-CM | POA: Diagnosis not present

## 2019-02-01 DIAGNOSIS — I509 Heart failure, unspecified: Secondary | ICD-10-CM | POA: Diagnosis not present

## 2019-02-01 DIAGNOSIS — R1310 Dysphagia, unspecified: Secondary | ICD-10-CM | POA: Diagnosis not present

## 2019-02-01 DIAGNOSIS — J449 Chronic obstructive pulmonary disease, unspecified: Secondary | ICD-10-CM | POA: Diagnosis not present

## 2019-02-03 DIAGNOSIS — J9601 Acute respiratory failure with hypoxia: Secondary | ICD-10-CM | POA: Diagnosis not present

## 2019-02-03 DIAGNOSIS — J449 Chronic obstructive pulmonary disease, unspecified: Secondary | ICD-10-CM | POA: Diagnosis not present

## 2019-02-03 DIAGNOSIS — R0602 Shortness of breath: Secondary | ICD-10-CM | POA: Diagnosis not present

## 2019-02-03 DIAGNOSIS — J441 Chronic obstructive pulmonary disease with (acute) exacerbation: Secondary | ICD-10-CM | POA: Diagnosis not present

## 2019-02-10 DIAGNOSIS — S22070G Wedge compression fracture of T9-T10 vertebra, subsequent encounter for fracture with delayed healing: Secondary | ICD-10-CM | POA: Diagnosis not present

## 2019-02-10 DIAGNOSIS — Z87891 Personal history of nicotine dependence: Secondary | ICD-10-CM | POA: Diagnosis not present

## 2019-02-10 DIAGNOSIS — R2689 Other abnormalities of gait and mobility: Secondary | ICD-10-CM | POA: Diagnosis not present

## 2019-02-10 DIAGNOSIS — Z8781 Personal history of (healed) traumatic fracture: Secondary | ICD-10-CM | POA: Diagnosis not present

## 2019-02-10 DIAGNOSIS — M4324 Fusion of spine, thoracic region: Secondary | ICD-10-CM | POA: Diagnosis not present

## 2019-02-10 DIAGNOSIS — M40209 Unspecified kyphosis, site unspecified: Secondary | ICD-10-CM | POA: Diagnosis not present

## 2019-02-10 DIAGNOSIS — Z9181 History of falling: Secondary | ICD-10-CM | POA: Diagnosis not present

## 2019-02-10 DIAGNOSIS — Z981 Arthrodesis status: Secondary | ICD-10-CM | POA: Diagnosis not present

## 2019-02-10 DIAGNOSIS — S22070A Wedge compression fracture of T9-T10 vertebra, initial encounter for closed fracture: Secondary | ICD-10-CM | POA: Diagnosis not present

## 2019-02-15 DIAGNOSIS — G43919 Migraine, unspecified, intractable, without status migrainosus: Secondary | ICD-10-CM | POA: Diagnosis not present

## 2019-02-15 DIAGNOSIS — J418 Mixed simple and mucopurulent chronic bronchitis: Secondary | ICD-10-CM | POA: Diagnosis not present

## 2019-02-15 DIAGNOSIS — M8008XA Age-related osteoporosis with current pathological fracture, vertebra(e), initial encounter for fracture: Secondary | ICD-10-CM | POA: Diagnosis not present

## 2019-02-15 DIAGNOSIS — K222 Esophageal obstruction: Secondary | ICD-10-CM | POA: Diagnosis not present

## 2019-02-17 DIAGNOSIS — S24102D Unspecified injury at T2-T6 level of thoracic spinal cord, subsequent encounter: Secondary | ICD-10-CM | POA: Diagnosis not present

## 2019-02-17 DIAGNOSIS — M545 Low back pain: Secondary | ICD-10-CM | POA: Diagnosis not present

## 2019-02-17 DIAGNOSIS — I10 Essential (primary) hypertension: Secondary | ICD-10-CM | POA: Diagnosis not present

## 2019-02-17 DIAGNOSIS — K449 Diaphragmatic hernia without obstruction or gangrene: Secondary | ICD-10-CM | POA: Diagnosis not present

## 2019-02-17 DIAGNOSIS — S22070G Wedge compression fracture of T9-T10 vertebra, subsequent encounter for fracture with delayed healing: Secondary | ICD-10-CM | POA: Diagnosis not present

## 2019-02-17 DIAGNOSIS — S24101D Unspecified injury at T1 level of thoracic spinal cord, subsequent encounter: Secondary | ICD-10-CM | POA: Diagnosis not present

## 2019-02-17 DIAGNOSIS — J449 Chronic obstructive pulmonary disease, unspecified: Secondary | ICD-10-CM | POA: Diagnosis not present

## 2019-02-17 DIAGNOSIS — K259 Gastric ulcer, unspecified as acute or chronic, without hemorrhage or perforation: Secondary | ICD-10-CM | POA: Diagnosis not present

## 2019-02-17 DIAGNOSIS — S72001D Fracture of unspecified part of neck of right femur, subsequent encounter for closed fracture with routine healing: Secondary | ICD-10-CM | POA: Diagnosis not present

## 2019-02-17 DIAGNOSIS — R1314 Dysphagia, pharyngoesophageal phase: Secondary | ICD-10-CM | POA: Diagnosis not present

## 2019-02-17 DIAGNOSIS — M792 Neuralgia and neuritis, unspecified: Secondary | ICD-10-CM | POA: Diagnosis not present

## 2019-02-17 DIAGNOSIS — J8489 Other specified interstitial pulmonary diseases: Secondary | ICD-10-CM | POA: Diagnosis not present

## 2019-02-17 DIAGNOSIS — M4324 Fusion of spine, thoracic region: Secondary | ICD-10-CM | POA: Diagnosis not present

## 2019-02-17 DIAGNOSIS — Z01818 Encounter for other preprocedural examination: Secondary | ICD-10-CM | POA: Diagnosis not present

## 2019-02-17 DIAGNOSIS — E119 Type 2 diabetes mellitus without complications: Secondary | ICD-10-CM | POA: Diagnosis not present

## 2019-02-17 DIAGNOSIS — K209 Esophagitis, unspecified: Secondary | ICD-10-CM | POA: Diagnosis not present

## 2019-02-17 DIAGNOSIS — G8918 Other acute postprocedural pain: Secondary | ICD-10-CM | POA: Diagnosis not present

## 2019-02-17 DIAGNOSIS — G8911 Acute pain due to trauma: Secondary | ICD-10-CM | POA: Diagnosis not present

## 2019-02-17 DIAGNOSIS — J9620 Acute and chronic respiratory failure, unspecified whether with hypoxia or hypercapnia: Secondary | ICD-10-CM | POA: Diagnosis not present

## 2019-02-17 DIAGNOSIS — S22070S Wedge compression fracture of T9-T10 vertebra, sequela: Secondary | ICD-10-CM | POA: Diagnosis not present

## 2019-02-17 DIAGNOSIS — Z1159 Encounter for screening for other viral diseases: Secondary | ICD-10-CM | POA: Diagnosis not present

## 2019-02-18 DIAGNOSIS — J449 Chronic obstructive pulmonary disease, unspecified: Secondary | ICD-10-CM | POA: Diagnosis not present

## 2019-02-18 DIAGNOSIS — J9601 Acute respiratory failure with hypoxia: Secondary | ICD-10-CM | POA: Diagnosis not present

## 2019-02-18 DIAGNOSIS — R062 Wheezing: Secondary | ICD-10-CM | POA: Diagnosis not present

## 2019-02-18 DIAGNOSIS — J441 Chronic obstructive pulmonary disease with (acute) exacerbation: Secondary | ICD-10-CM | POA: Diagnosis not present

## 2019-02-19 DIAGNOSIS — S22070G Wedge compression fracture of T9-T10 vertebra, subsequent encounter for fracture with delayed healing: Secondary | ICD-10-CM | POA: Diagnosis not present

## 2019-02-19 DIAGNOSIS — E119 Type 2 diabetes mellitus without complications: Secondary | ICD-10-CM | POA: Diagnosis not present

## 2019-02-19 DIAGNOSIS — H9190 Unspecified hearing loss, unspecified ear: Secondary | ICD-10-CM | POA: Diagnosis not present

## 2019-02-19 DIAGNOSIS — Z9114 Patient's other noncompliance with medication regimen: Secondary | ICD-10-CM | POA: Diagnosis not present

## 2019-02-19 DIAGNOSIS — I5032 Chronic diastolic (congestive) heart failure: Secondary | ICD-10-CM | POA: Diagnosis not present

## 2019-02-19 DIAGNOSIS — Z780 Asymptomatic menopausal state: Secondary | ICD-10-CM | POA: Diagnosis not present

## 2019-02-19 DIAGNOSIS — K210 Gastro-esophageal reflux disease with esophagitis: Secondary | ICD-10-CM | POA: Diagnosis not present

## 2019-02-19 DIAGNOSIS — Z9981 Dependence on supplemental oxygen: Secondary | ICD-10-CM | POA: Diagnosis not present

## 2019-02-19 DIAGNOSIS — I110 Hypertensive heart disease with heart failure: Secondary | ICD-10-CM | POA: Diagnosis not present

## 2019-02-19 DIAGNOSIS — G309 Alzheimer's disease, unspecified: Secondary | ICD-10-CM | POA: Diagnosis not present

## 2019-02-19 DIAGNOSIS — I2510 Atherosclerotic heart disease of native coronary artery without angina pectoris: Secondary | ICD-10-CM | POA: Diagnosis not present

## 2019-02-19 DIAGNOSIS — G8929 Other chronic pain: Secondary | ICD-10-CM | POA: Diagnosis not present

## 2019-02-19 DIAGNOSIS — S22009G Unspecified fracture of unspecified thoracic vertebra, subsequent encounter for fracture with delayed healing: Secondary | ICD-10-CM | POA: Diagnosis not present

## 2019-02-19 DIAGNOSIS — G4730 Sleep apnea, unspecified: Secondary | ICD-10-CM | POA: Diagnosis not present

## 2019-02-19 DIAGNOSIS — M1990 Unspecified osteoarthritis, unspecified site: Secondary | ICD-10-CM | POA: Diagnosis not present

## 2019-02-19 DIAGNOSIS — Z7951 Long term (current) use of inhaled steroids: Secondary | ICD-10-CM | POA: Diagnosis not present

## 2019-02-19 DIAGNOSIS — I252 Old myocardial infarction: Secondary | ICD-10-CM | POA: Diagnosis not present

## 2019-02-19 DIAGNOSIS — Z9911 Dependence on respirator [ventilator] status: Secondary | ICD-10-CM | POA: Diagnosis not present

## 2019-02-19 DIAGNOSIS — Z87891 Personal history of nicotine dependence: Secondary | ICD-10-CM | POA: Diagnosis not present

## 2019-02-19 DIAGNOSIS — J449 Chronic obstructive pulmonary disease, unspecified: Secondary | ICD-10-CM | POA: Diagnosis not present

## 2019-02-19 DIAGNOSIS — Z7952 Long term (current) use of systemic steroids: Secondary | ICD-10-CM | POA: Diagnosis not present

## 2019-02-19 DIAGNOSIS — J9620 Acute and chronic respiratory failure, unspecified whether with hypoxia or hypercapnia: Secondary | ICD-10-CM | POA: Diagnosis not present

## 2019-02-20 DIAGNOSIS — R062 Wheezing: Secondary | ICD-10-CM | POA: Diagnosis not present

## 2019-02-20 DIAGNOSIS — J9601 Acute respiratory failure with hypoxia: Secondary | ICD-10-CM | POA: Diagnosis not present

## 2019-02-20 DIAGNOSIS — J441 Chronic obstructive pulmonary disease with (acute) exacerbation: Secondary | ICD-10-CM | POA: Diagnosis not present

## 2019-02-20 DIAGNOSIS — J449 Chronic obstructive pulmonary disease, unspecified: Secondary | ICD-10-CM | POA: Diagnosis not present

## 2019-02-23 DIAGNOSIS — G8929 Other chronic pain: Secondary | ICD-10-CM | POA: Diagnosis not present

## 2019-02-23 DIAGNOSIS — R6889 Other general symptoms and signs: Secondary | ICD-10-CM | POA: Diagnosis not present

## 2019-03-06 DIAGNOSIS — J441 Chronic obstructive pulmonary disease with (acute) exacerbation: Secondary | ICD-10-CM | POA: Diagnosis not present

## 2019-03-06 DIAGNOSIS — J9601 Acute respiratory failure with hypoxia: Secondary | ICD-10-CM | POA: Diagnosis not present

## 2019-03-06 DIAGNOSIS — J449 Chronic obstructive pulmonary disease, unspecified: Secondary | ICD-10-CM | POA: Diagnosis not present

## 2019-03-06 DIAGNOSIS — R0602 Shortness of breath: Secondary | ICD-10-CM | POA: Diagnosis not present

## 2019-03-08 ENCOUNTER — Encounter: Payer: Self-pay | Admitting: *Deleted

## 2019-03-08 ENCOUNTER — Other Ambulatory Visit: Payer: Self-pay

## 2019-03-08 ENCOUNTER — Emergency Department: Payer: Medicare Other

## 2019-03-08 ENCOUNTER — Inpatient Hospital Stay
Admission: EM | Admit: 2019-03-08 | Discharge: 2019-03-10 | DRG: 189 | Disposition: A | Discharge status: Home / Self Care | Payer: Medicare Other | Attending: Internal Medicine | Admitting: Internal Medicine

## 2019-03-08 DIAGNOSIS — R918 Other nonspecific abnormal finding of lung field: Secondary | ICD-10-CM | POA: Diagnosis not present

## 2019-03-08 DIAGNOSIS — F17210 Nicotine dependence, cigarettes, uncomplicated: Secondary | ICD-10-CM | POA: Diagnosis present

## 2019-03-08 DIAGNOSIS — Z825 Family history of asthma and other chronic lower respiratory diseases: Secondary | ICD-10-CM | POA: Diagnosis not present

## 2019-03-08 DIAGNOSIS — J9601 Acute respiratory failure with hypoxia: Secondary | ICD-10-CM | POA: Diagnosis not present

## 2019-03-08 DIAGNOSIS — K219 Gastro-esophageal reflux disease without esophagitis: Secondary | ICD-10-CM | POA: Diagnosis not present

## 2019-03-08 DIAGNOSIS — I110 Hypertensive heart disease with heart failure: Secondary | ICD-10-CM | POA: Diagnosis present

## 2019-03-08 DIAGNOSIS — Z823 Family history of stroke: Secondary | ICD-10-CM

## 2019-03-08 DIAGNOSIS — J441 Chronic obstructive pulmonary disease with (acute) exacerbation: Secondary | ICD-10-CM

## 2019-03-08 DIAGNOSIS — J439 Emphysema, unspecified: Secondary | ICD-10-CM | POA: Diagnosis present

## 2019-03-08 DIAGNOSIS — Z885 Allergy status to narcotic agent status: Secondary | ICD-10-CM | POA: Diagnosis not present

## 2019-03-08 DIAGNOSIS — E7800 Pure hypercholesterolemia, unspecified: Secondary | ICD-10-CM | POA: Diagnosis not present

## 2019-03-08 DIAGNOSIS — Z20828 Contact with and (suspected) exposure to other viral communicable diseases: Secondary | ICD-10-CM | POA: Diagnosis present

## 2019-03-08 DIAGNOSIS — F0390 Unspecified dementia without behavioral disturbance: Secondary | ICD-10-CM | POA: Diagnosis present

## 2019-03-08 DIAGNOSIS — F418 Other specified anxiety disorders: Secondary | ICD-10-CM | POA: Diagnosis present

## 2019-03-08 DIAGNOSIS — Z981 Arthrodesis status: Secondary | ICD-10-CM

## 2019-03-08 DIAGNOSIS — R404 Transient alteration of awareness: Secondary | ICD-10-CM | POA: Diagnosis not present

## 2019-03-08 DIAGNOSIS — G43909 Migraine, unspecified, not intractable, without status migrainosus: Secondary | ICD-10-CM | POA: Diagnosis not present

## 2019-03-08 DIAGNOSIS — Z8249 Family history of ischemic heart disease and other diseases of the circulatory system: Secondary | ICD-10-CM

## 2019-03-08 DIAGNOSIS — I5031 Acute diastolic (congestive) heart failure: Secondary | ICD-10-CM | POA: Diagnosis not present

## 2019-03-08 DIAGNOSIS — J8489 Other specified interstitial pulmonary diseases: Secondary | ICD-10-CM | POA: Diagnosis present

## 2019-03-08 DIAGNOSIS — I10 Essential (primary) hypertension: Secondary | ICD-10-CM | POA: Diagnosis not present

## 2019-03-08 DIAGNOSIS — J9622 Acute and chronic respiratory failure with hypercapnia: Secondary | ICD-10-CM | POA: Diagnosis not present

## 2019-03-08 DIAGNOSIS — I252 Old myocardial infarction: Secondary | ICD-10-CM

## 2019-03-08 DIAGNOSIS — I5033 Acute on chronic diastolic (congestive) heart failure: Secondary | ICD-10-CM | POA: Diagnosis present

## 2019-03-08 DIAGNOSIS — M549 Dorsalgia, unspecified: Secondary | ICD-10-CM | POA: Diagnosis present

## 2019-03-08 DIAGNOSIS — J9621 Acute and chronic respiratory failure with hypoxia: Principal | ICD-10-CM | POA: Diagnosis present

## 2019-03-08 DIAGNOSIS — R0689 Other abnormalities of breathing: Secondary | ICD-10-CM | POA: Diagnosis not present

## 2019-03-08 DIAGNOSIS — R0603 Acute respiratory distress: Secondary | ICD-10-CM | POA: Diagnosis not present

## 2019-03-08 DIAGNOSIS — Z880 Allergy status to penicillin: Secondary | ICD-10-CM

## 2019-03-08 DIAGNOSIS — G8929 Other chronic pain: Secondary | ICD-10-CM | POA: Diagnosis present

## 2019-03-08 LAB — URINE DRUG SCREEN, QUALITATIVE (ARMC ONLY)
Amphetamines, Ur Screen: NOT DETECTED
Barbiturates, Ur Screen: NOT DETECTED
Benzodiazepine, Ur Scrn: POSITIVE — AB
Cannabinoid 50 Ng, Ur ~~LOC~~: POSITIVE — AB
Cocaine Metabolite,Ur ~~LOC~~: NOT DETECTED
MDMA (Ecstasy)Ur Screen: NOT DETECTED
Methadone Scn, Ur: NOT DETECTED
Opiate, Ur Screen: NOT DETECTED
Phencyclidine (PCP) Ur S: NOT DETECTED
Tricyclic, Ur Screen: NOT DETECTED

## 2019-03-08 LAB — CBC WITH DIFFERENTIAL/PLATELET
Abs Immature Granulocytes: 0.08 10*3/uL — ABNORMAL HIGH (ref 0.00–0.07)
Basophils Absolute: 0 10*3/uL (ref 0.0–0.1)
Basophils Relative: 0 %
Eosinophils Absolute: 0.1 10*3/uL (ref 0.0–0.5)
Eosinophils Relative: 1 %
HCT: 39.8 % (ref 36.0–46.0)
Hemoglobin: 12.1 g/dL (ref 12.0–15.0)
Immature Granulocytes: 1 %
Lymphocytes Relative: 9 %
Lymphs Abs: 0.9 10*3/uL (ref 0.7–4.0)
MCH: 26.9 pg (ref 26.0–34.0)
MCHC: 30.4 g/dL (ref 30.0–36.0)
MCV: 88.6 fL (ref 80.0–100.0)
Monocytes Absolute: 1.1 10*3/uL — ABNORMAL HIGH (ref 0.1–1.0)
Monocytes Relative: 10 %
Neutro Abs: 8.6 10*3/uL — ABNORMAL HIGH (ref 1.7–7.7)
Neutrophils Relative %: 79 %
Platelets: 209 10*3/uL (ref 150–400)
RBC: 4.49 MIL/uL (ref 3.87–5.11)
RDW: 15.9 % — ABNORMAL HIGH (ref 11.5–15.5)
WBC: 10.8 10*3/uL — ABNORMAL HIGH (ref 4.0–10.5)
nRBC: 0 % (ref 0.0–0.2)

## 2019-03-08 LAB — COMPREHENSIVE METABOLIC PANEL
ALT: 14 U/L (ref 0–44)
AST: 17 U/L (ref 15–41)
Albumin: 3.3 g/dL — ABNORMAL LOW (ref 3.5–5.0)
Alkaline Phosphatase: 153 U/L — ABNORMAL HIGH (ref 38–126)
Anion gap: 8 (ref 5–15)
BUN: 36 mg/dL — ABNORMAL HIGH (ref 6–20)
CO2: 27 mmol/L (ref 22–32)
Calcium: 8.1 mg/dL — ABNORMAL LOW (ref 8.9–10.3)
Chloride: 107 mmol/L (ref 98–111)
Creatinine, Ser: 0.36 mg/dL — ABNORMAL LOW (ref 0.44–1.00)
GFR calc Af Amer: >60 mL/min (ref >60)
GFR calc non Af Amer: >60 mL/min (ref >60)
Glucose, Bld: 108 mg/dL — ABNORMAL HIGH (ref 70–99)
Potassium: 3.7 mmol/L (ref 3.5–5.1)
Sodium: 142 mmol/L (ref 135–145)
Total Bilirubin: 0.4 mg/dL (ref 0.3–1.2)
Total Protein: 5.9 g/dL — ABNORMAL LOW (ref 6.5–8.1)

## 2019-03-08 LAB — URINALYSIS, COMPLETE (UACMP) WITH MICROSCOPIC
Bacteria, UA: NONE SEEN
Bilirubin Urine: NEGATIVE
Glucose, UA: NEGATIVE mg/dL
Hgb urine dipstick: NEGATIVE
Ketones, ur: NEGATIVE mg/dL
Leukocytes,Ua: NEGATIVE
Nitrite: NEGATIVE
Protein, ur: NEGATIVE mg/dL
Specific Gravity, Urine: 1.009 (ref 1.005–1.030)
pH: 7 (ref 5.0–8.0)

## 2019-03-08 LAB — TROPONIN I: Troponin I: <0.03 ng/mL (ref <0.03)

## 2019-03-08 LAB — LACTIC ACID, PLASMA
Lactic Acid, Venous: 0.8 mmol/L (ref 0.5–1.9)
Lactic Acid, Venous: 0.8 mmol/L (ref 0.5–1.9)

## 2019-03-08 LAB — GLUCOSE, CAPILLARY: Glucose-Capillary: 125 mg/dL — ABNORMAL HIGH (ref 70–99)

## 2019-03-08 LAB — MRSA PCR SCREENING: MRSA by PCR: NEGATIVE

## 2019-03-08 LAB — SARS CORONAVIRUS 2 BY RT PCR (HOSPITAL ORDER, PERFORMED IN ~~LOC~~ HOSPITAL LAB): SARS Coronavirus 2: NEGATIVE

## 2019-03-08 LAB — BRAIN NATRIURETIC PEPTIDE: B Natriuretic Peptide: 39 pg/mL (ref 0.0–100.0)

## 2019-03-08 MED ORDER — ACETAMINOPHEN 325 MG PO TABS
650.0000 mg | ORAL_TABLET | Freq: Four times a day (QID) | ORAL | Status: DC | PRN
  Administered 2019-03-08 – 2019-03-09 (×3): 650 mg via ORAL
  Filled 2019-03-08 (×3): qty 2

## 2019-03-08 MED ORDER — NEBIVOLOL HCL 10 MG PO TABS
10.0000 mg | ORAL_TABLET | Freq: Every day | ORAL | Status: DC
  Administered 2019-03-10: 10 mg via ORAL
  Filled 2019-03-08 (×2): qty 1

## 2019-03-08 MED ORDER — BUDESONIDE 0.5 MG/2ML IN SUSP
0.5000 mg | Freq: Two times a day (BID) | RESPIRATORY_TRACT | Status: DC
  Administered 2019-03-08: 0.5 mg via RESPIRATORY_TRACT
  Filled 2019-03-08 (×3): qty 2

## 2019-03-08 MED ORDER — IPRATROPIUM-ALBUTEROL 0.5-2.5 (3) MG/3ML IN SOLN
3.0000 mL | Freq: Once | RESPIRATORY_TRACT | Status: AC
  Administered 2019-03-08: 3 mL via RESPIRATORY_TRACT
  Filled 2019-03-08: qty 3

## 2019-03-08 MED ORDER — CHLORHEXIDINE GLUCONATE 0.12 % MT SOLN
15.0000 mL | Freq: Two times a day (BID) | OROMUCOSAL | Status: DC
  Administered 2019-03-08 – 2019-03-10 (×3): 15 mL via OROMUCOSAL
  Filled 2019-03-08 (×4): qty 15

## 2019-03-08 MED ORDER — ALPRAZOLAM 0.5 MG PO TABS
0.5000 mg | ORAL_TABLET | Freq: Three times a day (TID) | ORAL | Status: DC
  Administered 2019-03-08: 12:00:00 0.5 mg via ORAL
  Filled 2019-03-08: qty 1

## 2019-03-08 MED ORDER — METHYLPREDNISOLONE SODIUM SUCC 125 MG IJ SOLR
125.0000 mg | Freq: Once | INTRAMUSCULAR | Status: AC
  Administered 2019-03-08: 125 mg via INTRAVENOUS
  Filled 2019-03-08: qty 2

## 2019-03-08 MED ORDER — ONDANSETRON HCL 4 MG/2ML IJ SOLN: 4.0000 mg | Freq: Four times a day (QID) | INTRAMUSCULAR | Status: DC | PRN

## 2019-03-08 MED ORDER — IPRATROPIUM-ALBUTEROL 0.5-2.5 (3) MG/3ML IN SOLN
RESPIRATORY_TRACT | Status: AC
  Administered 2019-03-08: 11:00:00 3 mL via RESPIRATORY_TRACT
  Filled 2019-03-08: qty 3

## 2019-03-08 MED ORDER — PREGABALIN 50 MG PO CAPS
100.0000 mg | ORAL_CAPSULE | Freq: Two times a day (BID) | ORAL | Status: DC
  Administered 2019-03-08 – 2019-03-10 (×5): 100 mg via ORAL
  Filled 2019-03-08 (×5): qty 2

## 2019-03-08 MED ORDER — DILTIAZEM HCL ER COATED BEADS 120 MG PO CP24
120.0000 mg | ORAL_CAPSULE | Freq: Every day | ORAL | Status: DC
  Administered 2019-03-08 – 2019-03-10 (×3): 120 mg via ORAL
  Filled 2019-03-08 (×3): qty 1

## 2019-03-08 MED ORDER — IPRATROPIUM-ALBUTEROL 0.5-2.5 (3) MG/3ML IN SOLN
3.0000 mL | Freq: Four times a day (QID) | RESPIRATORY_TRACT | Status: DC
  Administered 2019-03-08 – 2019-03-09 (×5): 3 mL via RESPIRATORY_TRACT
  Filled 2019-03-08 (×4): qty 3

## 2019-03-08 MED ORDER — DONEPEZIL HCL 5 MG PO TABS
10.0000 mg | ORAL_TABLET | Freq: Every day | ORAL | Status: DC
  Administered 2019-03-09 – 2019-03-10 (×2): 10 mg via ORAL
  Filled 2019-03-08 (×2): qty 2

## 2019-03-08 MED ORDER — MELOXICAM 7.5 MG PO TABS
7.5000 mg | ORAL_TABLET | Freq: Every day | ORAL | Status: DC
  Administered 2019-03-08 – 2019-03-10 (×3): 7.5 mg via ORAL
  Filled 2019-03-08 (×3): qty 1

## 2019-03-08 MED ORDER — ACETAMINOPHEN 650 MG RE SUPP: 650.0000 mg | Freq: Four times a day (QID) | RECTAL | Status: DC | PRN

## 2019-03-08 MED ORDER — ENOXAPARIN SODIUM 40 MG/0.4ML ~~LOC~~ SOLN
40.0000 mg | SUBCUTANEOUS | Status: DC
  Filled 2019-03-08 (×2): qty 0.4

## 2019-03-08 MED ORDER — ORAL CARE MOUTH RINSE: 15.0000 mL | Freq: Two times a day (BID) | OROMUCOSAL | Status: DC

## 2019-03-08 MED ORDER — ONDANSETRON HCL 4 MG PO TABS: 4.0000 mg | ORAL_TABLET | Freq: Four times a day (QID) | ORAL | Status: DC | PRN

## 2019-03-08 MED ORDER — VITAMIN C 500 MG PO TABS
1000.0000 mg | ORAL_TABLET | Freq: Every day | ORAL | Status: DC
  Administered 2019-03-09 – 2019-03-10 (×2): 1000 mg via ORAL
  Filled 2019-03-08 (×2): qty 2

## 2019-03-08 MED ORDER — ALPRAZOLAM 0.5 MG PO TABS
0.5000 mg | ORAL_TABLET | Freq: Three times a day (TID) | ORAL | Status: DC
  Administered 2019-03-08 – 2019-03-10 (×6): 0.5 mg via ORAL
  Filled 2019-03-08 (×6): qty 1

## 2019-03-08 MED ORDER — DULOXETINE HCL 30 MG PO CPEP
60.0000 mg | ORAL_CAPSULE | Freq: Every day | ORAL | Status: DC
  Administered 2019-03-09 – 2019-03-10 (×2): 60 mg via ORAL
  Filled 2019-03-08 (×2): qty 2

## 2019-03-08 MED ORDER — LORAZEPAM 2 MG/ML IJ SOLN
0.5000 mg | Freq: Once | INTRAMUSCULAR | Status: AC
  Administered 2019-03-08: 07:00:00 0.5 mg via INTRAVENOUS
  Filled 2019-03-08: qty 1

## 2019-03-08 MED ORDER — ROSUVASTATIN CALCIUM 10 MG PO TABS
10.0000 mg | ORAL_TABLET | Freq: Every day | ORAL | Status: DC
  Administered 2019-03-08 – 2019-03-10 (×3): 10 mg via ORAL
  Filled 2019-03-08 (×3): qty 1

## 2019-03-08 MED ORDER — FUROSEMIDE 10 MG/ML IJ SOLN
40.0000 mg | Freq: Once | INTRAMUSCULAR | Status: AC
  Administered 2019-03-08: 40 mg via INTRAVENOUS
  Filled 2019-03-08: qty 4

## 2019-03-08 MED ORDER — METHYLPREDNISOLONE SODIUM SUCC 40 MG IJ SOLR
40.0000 mg | Freq: Two times a day (BID) | INTRAMUSCULAR | Status: AC
  Administered 2019-03-08 – 2019-03-09 (×4): 40 mg via INTRAVENOUS
  Filled 2019-03-08 (×4): qty 1

## 2019-03-08 MED ORDER — LIDOCAINE 5 % EX PTCH
1.0000 | MEDICATED_PATCH | CUTANEOUS | Status: DC
  Administered 2019-03-08 – 2019-03-10 (×3): 1 via TRANSDERMAL
  Filled 2019-03-08 (×3): qty 1

## 2019-03-08 NOTE — Progress Notes (Signed)
   03/08/19 1214  Clinical Encounter Type  Visited With Patient  Visit Type Initial  Referral From Nurse  Consult/Referral To Chaplain  Spiritual Encounters  Spiritual Needs Other (Comment)  Mental Health Advance Directives  Would patient like information on creating a mental health advance directive? No - Patient declined  Chaplain received a OR to update an AD. Patient would like to change the living will part of AD. Chaplain explained that a new AD had to be completed. Patient was resting and Chaplain told her she would return later.

## 2019-03-08 NOTE — ED Notes (Signed)
Report given to ICU RN

## 2019-03-08 NOTE — ED Notes (Signed)
ED TO INPATIENT HANDOFF REPORT  ED Nurse Name and Phone #: Janett Billow [phone]   S Name/Age/Gender Caitlyn Evans 53 y.o. female Room/Bed: ED04A/ED04A  Code Status   Code Status: Full Code  Home/SNF/Other Home Patient oriented to: self, place, time and situation Is this baseline? Yes   Triage Complete: Triage complete  Chief Complaint shortness of breath   Triage Note Per EMS pt found to be in extreme resp distress by family around 4am. Called 911. Pt arrived on 15L and c/0 inability to breath   Allergies Allergies  Allergen Reactions  . Codeine Hives, Nausea And Vomiting and Nausea Only  . Penicillins Hives and Other (See Comments)    Has patient had a PCN reaction causing immediate rash, facial/tongue/throat swelling, SOB or lightheadedness with hypotension: No Has patient had a PCN reaction causing severe rash involving mucus membranes or skin necrosis: No Has patient had a PCN reaction that required hospitalization No Has patient had a PCN reaction occurring within the last 10 years: No If all of the above answers are "NO", then may proceed with Cephalosporin use. Other reaction(s): Other (See Comments) Has patient had a PCN    Level of Care/Admitting Diagnosis ED Disposition    ED Disposition Condition [place] [100120]  Level of Care: Stepdown [14]  Covid Evaluation: Confirmed COVID Negative  Diagnosis: Acute respiratory failure with hypoxia [facility]) [[id number]]  Admitting Physician: Loletha Grayer [[id number]]  Attending Physician: Loletha Grayer [phone]  Estimated length of stay: past midnight tomorrow  Certification:: I certify this patient will need inpatient services for at least 2 midnights  PT Class (Do Not Modify): Inpatient [101]  PT Acc Code (Do Not Modify): Private [1]       B Medical/Surgery History Past Medical History:  Diagnosis Date  . Anxiety    panic attacks  . Asthma   . 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 CHF (congestive heart failure) ([place])   . Dysphagia, pharyngoesophageal phase 05/30/2015  . Emphysema lung ([place]) 05/30/2015   on steroids and nebulizer for copd issues.  09/15/18  . GERD (gastroesophageal reflux disease)   . Hypercholesterolemia   . Hypertension   . Lung mass   . Migraines   . Motion sickness    all moving vehicles  . Myocardial infarction ([place]) 2012  . Respiratory failure with hypercapnia ([place]) 08/2018   was hospitalized and on bipap. steroids and nebulizer treatments helped  . Seasonal allergies   . Shortness of breath dyspnea    1 flight-stairs  . Sternal fracture    Past Surgical History:  Procedure Laterality Date  . BACK SURGERY  10/2018   spinal fusion at [facility]  . COLONOSCOPY WITH PROPOFOL N/A 06/02/2015   Procedure: COLONOSCOPY WITH PROPOFOL;  Surgeon: Lucilla Lame, MD;  Location: [facility];  Service: Endoscopy;  Laterality: N/A;  . ESOPHAGOGASTRODUODENOSCOPY (EGD) WITH PROPOFOL N/A 06/02/2015   Procedure: ESOPHAGOGASTRODUODENOSCOPY (EGD) WITH PROPOFOL withdialation;  Surgeon: Lucilla Lame, MD;  Location: [facility];  Service: Endoscopy;  Laterality: N/A;  . ESOPHAGOGASTRODUODENOSCOPY (EGD) WITH PROPOFOL N/A 11/05/2017   Procedure: ESOPHAGOGASTRODUODENOSCOPY (EGD) WITH PROPOFOL;  Surgeon: Virgel Manifold, MD;  Location: [facility] END[facility]COPY;  Service: Endoscopy;  Laterality: N/A;  . HIP PINNING,CANNULATED Right 11/17/2018   Procedure: CANNULATED HIP PINNING;  Surgeon: Leim Fabry, MD;  Location: [facility]S;  Service: Orthopedics;  Laterality: Right;  . KYPHOPLASTY N/A 09/17/2018   Procedure: KYPHOPLASTY T6;  Surgeon: Kennedy BuckerMenz, Michael, MD;  Location: [facility] ORS;  Service: Orthopedics;  Laterality: N/A;  . LUNG SURGERY Left    Upper lobe removed  . OOPHORECTOMY Left   . VAGINAL HYSTERECTOMY       A IV Location/Drains/Wounds Patient Lines/Drains/Airways Status   Active Line/Drains/Airways     Name:   Placement date:   Placement time:   Site:   Days:   Peripheral IV 03/08/19 Left Wrist   03/08/19    0640    Wrist   less than 1   External Urinary Catheter   11/17/18    2016    -   111   Incision (Closed) 06/02/15 Lip Other (Comment)   06/02/15    1036     1375   Incision (Closed) 06/02/15 Rectum Other (Comment)   06/02/15    1036     1375   Incision (Closed) 09/17/18 Back Other (Comment)   09/17/18    0909     172   Incision (Closed) 11/17/18 Hip Right   11/17/18    1450     111          Intake/Output Last 24 hours No intake or output data in the 24 hours ending 03/08/19 1031  Labs/Imaging Results for orders placed or performed during the hospital encounter of 03/08/19 (from the past 48 hour(s))  CBC with Differential     Status: Abnormal   Collection Time: 03/08/19  6:07 AM  Result Value Ref Range   WBC 10.8 (H) 4.0 - 10.5 K/uL   RBC 4.49 3.87 - 5.11 MIL/uL   Hemoglobin 12.1 12.0 - 15.0 g/dL   HCT 16.139.8 09.636.0 - 04.546.0 %   MCV 88.6 80.0 - 100.0 fL   MCH 26.9 26.0 - 34.0 pg   MCHC 30.4 30.0 - 36.0 g/dL   RDW 40.915.9 (H) 81.111.5 - 91.415.5 %   Platelets 209 150 - 400 K/uL   nRBC 0.0 0.0 - 0.2 %   Neutrophils Relative % 79 %   Neutro Abs 8.6 (H) 1.7 - 7.7 K/uL   Lymphocytes Relative 9 %   Lymphs Abs 0.9 0.7 - 4.0 K/uL   Monocytes Relative 10 %   Monocytes Absolute 1.1 (H) 0.1 - 1.0 K/uL   Eosinophils Relative 1 %   Eosinophils Absolute 0.1 0.0 - 0.5 K/uL   Basophils Relative 0 %   Basophils Absolute 0.0 0.0 - 0.1 K/uL   Immature Granulocytes 1 %   Abs Immature Granulocytes 0.08 (H) 0.00 - 0.07 K/uL    Comment: Performed at [facility], [address] [place], [place] [address]  Comprehensive metabolic panel     Status: Abnormal   Collection Time: 03/08/19  6:07 AM  Result Value Ref Range   Sodium 142 135 - 145 mmol/L   Potassium 3.7 3.5 - 5.1 mmol/L   Chloride 107 98 - 111 mmol/L   CO2 27 22 - 32 mmol/L   Glucose, Bld 108 (H) 70 - 99 mg/dL   BUN 36 (H) 6 -  20 mg/dL   Creatinine, Ser 6.210.36 (L) 0.44 - 1.00 mg/dL   Calcium 8.1 (L) 8.9 - 10.3 mg/dL   Total Protein 5.9 (L) 6.5 - 8.1 g/dL   Albumin 3.3 (L) 3.5 - 5.0 g/dL   AST 17 15 - 41 U/L   ALT 14 0 - 44 U/L   Alkaline Phosphatase 153 (H) 38 - 126 U/L   Total Bilirubin 0.4 0.3 - 1.2 mg/dL  GFR calc non Af Amer >60 >60 mL/min   GFR calc Af Amer >60 >60 mL/min   Anion gap 8 5 - 15    Comment: Performed at [facility], [address] [place], [place] [address]  Brain natriuretic peptide     Status: None   Collection Time: 03/08/19  6:07 AM  Result Value Ref Range   B Natriuretic Peptide 39.0 0.0 - 100.0 pg/mL    Comment: Performed at [facility], [address] [place], [place] [address]  Troponin I - Once     Status: None   Collection Time: 03/08/19  6:07 AM  Result Value Ref Range   Troponin I <0.03 <0.03 ng/mL    Comment: Performed at [facility], [address] [place], [place] [address]  SARS Coronavirus 2 (CEPHEID- Performed in [facility] hospital lab), Hosp Order     Status: None   Collection Time: 03/08/19  6:07 AM   Specimen: Nasopharyngeal Swab  Result Value Ref Range   SARS Coronavirus 2 NEGATIVE NEGATIVE    Comment: (NOTE) If result is NEGATIVE SARS-CoV-2 target nucleic acids are NOT DETECTED. The SARS-CoV-2 RNA is generally detectable in upper and lower  respiratory specimens during the acute phase of infection. The lowest  concentration of SARS-CoV-2 viral copies this assay can detect is 250  copies / mL. A negative result does not preclude SARS-CoV-2 infection  and should not be used as the sole basis for treatment or other  patient management decisions.  A negative result may occur with  improper specimen collection / handling, submission of specimen other  than nasopharyngeal swab, presence of viral mutation(s) within the  areas targeted by this assay, and inadequate number of viral copies  (<250 copies / mL). A negative result  must be combined with clinical  observations, patient history, and epidemiological information. If result is POSITIVE SARS-CoV-2 target nucleic acids are DETECTED. The SARS-CoV-2 RNA is generally detectable in upper and lower  respiratory specimens dur ing the acute phase of infection.  Positive  results are indicative of active infection with SARS-CoV-2.  Clinical  correlation with patient history and other diagnostic information is  necessary to determine patient infection status.  Positive results do  not rule out bacterial infection or co-infection with other viruses. If result is PRESUMPTIVE POSTIVE SARS-CoV-2 nucleic acids MAY BE PRESENT.   A presumptive positive result was obtained on the submitted specimen  and confirmed on repeat testing.  While 2019 novel coronavirus  (SARS-CoV-2) nucleic acids may be present in the submitted sample  additional confirmatory testing may be necessary for epidemiological  and / or clinical management purposes  to differentiate between  SARS-CoV-2 and other Sarbecovirus currently known to infect humans.  If clinically indicated additional testing with an alternate test  methodology [phone] is advised. The SARS-CoV-2 RNA is generally  detectable in upper and lower respiratory sp ecimens during the acute  phase of infection. The expected result is Negative. Fact Sheet for Patients:  BoilerBrush.com.cyhttps://www.fda.gov/media/136312/download Fact Sheet for Healthcare Providers: https://pope.com/https://www.fda.gov/media/136313/download This test is not yet approved or cleared by the [place] FDA and has been authorized for detection and/or diagnosis of SARS-CoV-2 by FDA under an Emergency Use Authorization (EUA).  This EUA will remain in effect (meaning this test can be used) for the duration of the COVID-19 declaration under Section 564(b)(1) of the Act, 21 U.S.C. section 360bbb-3(b)(1), unless the authorization is terminated or revoked sooner. Performed at [facility], [address] [place], [place] [address]   Lactic acid, plasma     Status: None   Collection Time: 03/08/19  8:50 AM  Result Value Ref Range   Lactic Acid, Venous 0.8 0.5 - 1.9 mmol/L    Comment: Performed at [facility], [address] [place], [place] [address]   Dg Chest Port 1 View  Result Date: 03/08/2019 CLINICAL DATA:  Respiratory distress EXAM: PORTABLE CHEST 1 VIEW COMPARISON:  11/16/2018 FINDINGS: Mildly low lung volumes. Biapical scarring with left apical sutures. There is no edema, consolidation, effusion, or pneumothorax. Normal heart size and mediastinal contours. Compression fractures and upper thoracic fusion hardware. IMPRESSION: Mildly low lung volumes with chronic biapical scarring. Electronically Signed   By: Marnee Spring M.D.   On: 03/08/2019 06:42    Pending Labs Unresulted Labs (From admission, onward)    Start     Ordered   03/15/19 0500  Creatinine, serum  (enoxaparin (LOVENOX)    CrCl >/= 30 ml/min)  Weekly,   STAT    Comments: while on enoxaparin therapy    03/08/19 1030   03/09/19 0500  Basic metabolic panel  Tomorrow morning,   STAT     03/08/19 1030   03/09/19 0500  CBC  Tomorrow morning,   STAT     03/08/19 1030   03/08/19 1030  Creatinine, serum  (enoxaparin (LOVENOX)    CrCl >/= 30 ml/min)  Once,   STAT    Comments: Baseline for enoxaparin therapy IF NOT ALREADY DRAWN.    03/08/19 1030   03/08/19 0552  Culture, blood (routine x 2)  BLOOD CULTURE X 2,   STAT    Question:  Patient immune status  Answer:  Normal   03/08/19 0551   03/08/19 0552  Lactic acid, plasma  Now then every 2 hours,   STAT     03/08/19 0551   03/08/19 0552  Urinalysis, Complete w Microscopic  ONCE - STAT,   STAT     03/08/19 0551   03/08/19 0552  Urine culture  Once,   STAT    Question:  Patient immune status  Answer:  Normal   03/08/19 0551          Vitals/Pain Today's Vitals   03/08/19 0845 03/08/19 0900 03/08/19 0915 03/08/19 0930   BP:    107/67  Pulse: 77 70 68 73  Resp: (!) 23 (!) 22 (!) 21 19  Temp:      TempSrc:      SpO2: 100% 100% 100% 100%  Weight:      Height:      PainSc:        Isolation Precautions No active isolations  Medications Medications  ipratropium-albuterol (DUONEB) 0.5-2.5 (3) MG/3ML nebulizer solution 3 mL (has no administration in time range)  budesonide (PULMICORT) nebulizer solution 0.5 mg (has no administration in time range)  methylPREDNISolone sodium succinate (SOLU-MEDROL) 40 mg/mL injection 40 mg (has no administration in time range)  enoxaparin (LOVENOX) injection 40 mg (has no administration in time range)  acetaminophen (TYLENOL) tablet 650 mg (has no administration in time range)    Or  acetaminophen (TYLENOL) suppository 650 mg (has no administration in time range)  ondansetron (ZOFRAN) tablet 4 mg (has no administration in time range)    Or  ondansetron (ZOFRAN) injection 4 mg (has no administration in time range)  ipratropium-albuterol (DUONEB) 0.5-2.5 (3) MG/3ML nebulizer solution 3 mL (3 mLs Nebulization Given 03/08/19 0626)  methylPREDNISolone sodium succinate (SOLU-MEDROL) 125 mg/2 mL  injection 125 mg (125 mg Intravenous Given 03/08/19 0654)  LORazepam (ATIVAN) injection 0.5 mg (0.5 mg Intravenous Given 03/08/19 0650)    Mobility walks     Focused Assessments 1   R Recommendations: See Admitting Provider Note  Report given to:   Additional Notes:

## 2019-03-08 NOTE — ED Notes (Signed)
1 BC set sent

## 2019-03-08 NOTE — Progress Notes (Signed)
Patient refused ABG. Informed Dr. Alva Garnet.

## 2019-03-08 NOTE — Progress Notes (Signed)
Spoke with pts husband Marcello Moores.  Updated on pt status.  Pt currently resting on BiPAP.

## 2019-03-08 NOTE — Progress Notes (Signed)
   03/08/19 1500  Clinical Encounter Type  Visited With Patient not available  Visit Type Follow-up  Referral From Chaplain  Consult/Referral To Chaplain  Spiritual Encounters  Spiritual Needs Other (Comment)  Chaplain returned and patient was asleep. Chaplain will tell the on-call Chaplain to follow up with patient.

## 2019-03-08 NOTE — Progress Notes (Signed)
Patient ID: Caitlyn Evans, female   DOB: 1966-06-24, 53 y.o.   MRN: [id number]  ACP note.  Case discussed with patient at the bedside.  I also spoke with the patient's husband on the phone secondary to the hospital no visitor policy.  Patient coming in with shortness of breath for a few days.  Has some coughing and wheezing.  Patient has been requiring the BiPAP since being here in the hospital and patient is hesitant upon coming off the BiPAP.  She states when she gets like this sometimes she needs to be placed on life support.  CODE STATUS discussed.  Patient wishes to be a full code.  Plan.  We will admit to the CCU stepdown.  Case discussed with the critical care specialist.  We will get an ABG.  Continue BiPAP for now.  Continue steroids and nebulizer treatments.  COVID-19 testing negative.  With the patient's history of Boop and COPD exacerbation patient needs to be watched closely with her respiratory status in the ICU just in case she worsens and requires intubation.  Patient and husband okay with the plan.  Time spent on ACP discussion 17 minutes Dr Loletha Grayer

## 2019-03-08 NOTE — Consult Note (Signed)
Reason for Consult: Assistance with management of acute on chronic respiratory failure  Referring Physician: Alford HighlandWieting, Richard, MD  Caitlyn Evans is an 53 y.o. female.  HPI: 53 year old former smoker with a history of COPD and BOOP/COP, tented with a history of shortness of breath for the last 2 to 3 days.  He has had a dry nonproductive cough.  No hemoptysis.  She has noted that she has had a lot of "wheezing".  Does not describe any fevers or chills.  She presented to the emergency room was placed on BiPAP and had been on BiPAP since then.  She was hesitant to come off the BiPAP and felt anxious.  She was evaluated by Dr. Renae GlossWieting and has been admitted to stepdown as she has been requiring BiPAP.  She did receive diuretic due to a history of diastolic dysfunction.  This seems to have helped her significantly.  She has no chest pain.  Has had no orthopnea or paroxysmal nocturnal dyspnea.  Currently she appears comfortable on BiPAP.  She is only requiring room air with the BiPAP.  Past Medical History:  Diagnosis Date  . Anxiety    panic attacks  . Asthma   . BOOP (bronchiolitis obliterans with organizing pneumonia) (HCC)   . CHF (congestive heart failure) (HCC)   . Dysphagia, pharyngoesophageal phase 05/30/2015  . Emphysema lung (HCC) 05/30/2015   on steroids and nebulizer for copd issues.  09/15/18  . GERD (gastroesophageal reflux disease)   . Hypercholesterolemia   . Hypertension   . Lung mass   . Migraines   . Motion sickness    all moving vehicles  . Myocardial infarction (HCC) 2012  . Respiratory failure with hypercapnia (HCC) 08/2018   was hospitalized and on bipap. steroids and nebulizer treatments helped  . Seasonal allergies   . Shortness of breath dyspnea    1 flight-stairs  . Sternal fracture     Past Surgical History:  Procedure Laterality Date  . BACK SURGERY  10/2018   spinal fusion at [facility]  . COLONOSCOPY WITH PROPOFOL N/A 06/02/2015   Procedure: COLONOSCOPY WITH  PROPOFOL;  Surgeon: Midge Miniumarren Wohl, MD;  [facility];  Service: Endoscopy;  Laterality: N/A;  . ESOPHAGOGASTRODUODENOSCOPY (EGD) WITH PROPOFOL N/A 06/02/2015   Procedure: ESOPHAGOGASTRODUODENOSCOPY (EGD) WITH PROPOFOL withdialation;  Surgeon: Midge Miniumarren Wohl, MD;  [facility];  Service: Endoscopy;  Laterality: N/A;  . ESOPHAGOGASTRODUODENOSCOPY (EGD) WITH PROPOFOL N/A 11/05/2017   Procedure: ESOPHAGOGASTRODUODENOSCOPY (EGD) WITH PROPOFOL;  Surgeon: Pasty Spillersahiliani, Varnita B, MD;  Location: [facility];  Service: Endoscopy;  Laterality: N/A;  . HIP PINNING,CANNULATED Right 11/17/2018   Procedure: CANNULATED HIP PINNING;  Surgeon: Signa KellPatel, Sunny, MD;  Location: [facility];  Service: Orthopedics;  Laterality: Right;  . KYPHOPLASTY N/A 09/17/2018   Procedure: KYPHOPLASTY T6;  Surgeon: Kennedy BuckerMenz, Michael, MD;  Location: [facility];  Service: Orthopedics;  Laterality: N/A;  . LUNG SURGERY Left    Upper lobe removed  . OOPHORECTOMY Left   . VAGINAL HYSTERECTOMY      Family History  Problem Relation Age of Onset  . COPD Mother   . Heart attack Mother   . Stroke Father   . COPD Father     Social History:  reports that she has been smoking cigarettes. She has a 30.00 pack-year smoking history. She has never used smokeless tobacco. She reports current drug use. Drug: Marijuana. She reports that she does not drink alcohol.  Allergies:  Allergies  Allergen Reactions  . Codeine Hives,  Nausea And Vomiting and Nausea Only  . Penicillins Hives and Other (See Comments)    Has patient had a PCN reaction causing immediate rash, facial/tongue/throat swelling, SOB or lightheadedness with hypotension: No Has patient had a PCN reaction causing severe rash involving mucus membranes or skin necrosis: No Has patient had a PCN reaction that required hospitalization No Has patient had a PCN reaction occurring within the last 10 years: No If all of the above answers are "NO", then may proceed with  Cephalosporin use. Other reaction(s): Other (See Comments) Has patient had a PCN    Medications:  Prior to Admission:  Medications Prior to Admission  Medication Sig Dispense Refill Last Dose  . acetaminophen (TYLENOL) 650 MG CR tablet Take 650 mg by mouth every 4 (four) hours as needed for pain.   prn at prn  . ALPRAZolam (XANAX) 0.5 MG tablet Take 0.5 mg by mouth 3 (three) times daily.   unknown at unknown  . Ascorbic Acid (VITAMIN C) 1000 MG tablet Take 1,000 mg by mouth daily.   unknown at unknown  . budesonide-formoterol (SYMBICORT) 160-4.5 MCG/ACT inhaler Inhale 1 puff into the lungs 2 (two) times daily.   unknown at unknown  . diltiazem (CARDIZEM CD) 120 MG 24 hr capsule Take 120 mg by mouth daily.    unknown at unknown  . donepezil (ARICEPT) 10 MG tablet Take 10 mg by mouth daily.    unknown at unknown  . DULoxetine (CYMBALTA) 30 MG capsule Take 60 mg by mouth daily.    unknown at unknown  . ipratropium-albuterol (DUONEB) 0.5-2.5 (3) MG/3ML SOLN Inhale 3 mLs into the lungs every 4 (four) hours as needed (for wheezing/shortness of breath). 360 mL 2 prn at prn  . lidocaine (LIDODERM) 5 % Place 1 patch onto the skin daily. Remove & Discard patch within 12 hours or as directed by MD 30 patch 0 unknown at unknown  . meloxicam (MOBIC) 7.5 MG tablet Take 7.5 mg by mouth daily.   unknown at unknown  . naloxone (NARCAN) nasal spray 4 mg/0.1 mL Place 1 spray into the nose once.   prn at prn  . nebivolol (BYSTOLIC) 10 MG tablet Take 10 mg by mouth daily.   unknown at unknown  . pregabalin (LYRICA) 100 MG capsule Take 100 mg by mouth 2 (two) times daily.   unknown at unknown  . rosuvastatin (CRESTOR) 10 MG tablet Take 10 mg by mouth daily.    unknown at unknown    Results for orders placed or performed during the hospital encounter of 03/08/19 (from the past 48 hour(s))  CBC with Differential     Status: Abnormal   Collection Time: 03/08/19  6:07 AM  Result Value Ref Range   WBC 10.8 (H) 4.0  - 10.5 K/uL   RBC 4.49 3.87 - 5.11 MIL/uL   Hemoglobin 12.1 12.0 - 15.0 g/dL   HCT 39.8 36.0 - 46.0 %   MCV 88.6 80.0 - 100.0 fL   MCH 26.9 26.0 - 34.0 pg   MCHC 30.4 30.0 - 36.0 g/dL   RDW 15.9 (H) 11.5 - 15.5 %   Platelets 209 150 - 400 K/uL   nRBC 0.0 0.0 - 0.2 %   Neutrophils Relative % 79 %   Neutro Abs 8.6 (H) 1.7 - 7.7 K/uL   Lymphocytes Relative 9 %   Lymphs Abs 0.9 0.7 - 4.0 K/uL   Monocytes Relative 10 %   Monocytes Absolute 1.1 (H) 0.1 - 1.0 K/uL  Eosinophils Relative 1 %   Eosinophils Absolute 0.1 0.0 - 0.5 K/uL   Basophils Relative 0 %   Basophils Absolute 0.0 0.0 - 0.1 K/uL   Immature Granulocytes 1 %   Abs Immature Granulocytes 0.08 (H) 0.00 - 0.07 K/uL    Comment: Performed at [facility], [address] [place], [place] [address]  Comprehensive metabolic panel     Status: Abnormal   Collection Time: 03/08/19  6:07 AM  Result Value Ref Range   Sodium 142 135 - 145 mmol/L   Potassium 3.7 3.5 - 5.1 mmol/L   Chloride 107 98 - 111 mmol/L   CO2 27 22 - 32 mmol/L   Glucose, Bld 108 (H) 70 - 99 mg/dL   BUN 36 (H) 6 - 20 mg/dL   Creatinine, Ser 6.040.36 (L) 0.44 - 1.00 mg/dL   Calcium 8.1 (L) 8.9 - 10.3 mg/dL   Total Protein 5.9 (L) 6.5 - 8.1 g/dL   Albumin 3.3 (L) 3.5 - 5.0 g/dL   AST 17 15 - 41 U/L   ALT 14 0 - 44 U/L   Alkaline Phosphatase 153 (H) 38 - 126 U/L   Total Bilirubin 0.4 0.3 - 1.2 mg/dL   GFR calc non Af Amer >60 >60 mL/min   GFR calc Af Amer >60 >60 mL/min   Anion gap 8 5 - 15    Comment: Performed at [facility], [address] [place], [place] [address]  Brain natriuretic peptide     Status: None   Collection Time: 03/08/19  6:07 AM  Result Value Ref Range   B Natriuretic Peptide 39.0 0.0 - 100.0 pg/mL    Comment: Performed at [facility], [address] [place], [place] [address]  Troponin I - Once     Status: None   Collection Time: 03/08/19  6:07 AM  Result Value Ref Range   Troponin I <0.03 <0.03 ng/mL     Comment: Performed at [facility], [address] [place], [place] [address]  SARS Coronavirus 2 (CEPHEID- Performed in [facility]), Hosp Order     Status: None   Collection Time: 03/08/19  6:07 AM   Specimen: Nasopharyngeal Swab  Result Value Ref Range   SARS Coronavirus 2 NEGATIVE NEGATIVE    Comment: (NOTE) If result is NEGATIVE SARS-CoV-2 target nucleic acids are NOT DETECTED. The SARS-CoV-2 RNA is generally detectable in upper and lower  respiratory specimens during the acute phase of infection. The lowest  concentration of SARS-CoV-2 viral copies this assay can detect is 250  copies / mL. A negative result does not preclude SARS-CoV-2 infection  and should not be used as the sole basis for treatment or other  patient management decisions.  A negative result may occur with  improper specimen collection / handling, submission of specimen other  than nasopharyngeal swab, presence of viral mutation(s) within the  areas targeted by this assay, and inadequate number of viral copies  (<250 copies / mL). A negative result must be combined with clinical  observations, patient history, and epidemiological information. If result is POSITIVE SARS-CoV-2 target nucleic acids are DETECTED. The SARS-CoV-2 RNA is generally detectable in upper and lower  respiratory specimens dur ing the acute phase of infection.  Positive  results are indicative of active infection with SARS-CoV-2.  Clinical  correlation with patient history and other diagnostic information is  necessary to determine patient infection status.  Positive results do  not rule out bacterial infection  or co-infection with other viruses. If result is PRESUMPTIVE POSTIVE SARS-CoV-2 nucleic acids MAY BE PRESENT.   A presumptive positive result was obtained on the submitted specimen  and confirmed on repeat testing.  While 2019 novel coronavirus  (SARS-CoV-2) nucleic acids may be present in the submitted  sample  additional confirmatory testing may be necessary for epidemiological  and / or clinical management purposes  to differentiate between  SARS-CoV-2 and other Sarbecovirus currently known to infect humans.  If clinically indicated additional testing with an alternate test  methodology [phone]([id number]) is advised. The SARS-CoV-2 RNA is generally  detectable in upper and lower respiratory sp ecimens during the acute  phase of infection. The expected result is Negative. Fact Sheet for Patients:  BoilerBrush.com.cyhttps://www.fda.gov/media/136312/download Fact Sheet for Healthcare Providers: https://pope.com/https://www.fda.gov/media/136313/download This test is not yet approved or cleared by the [place] FDA and has been authorized for detection and/or diagnosis of SARS-CoV-2 by FDA under an Emergency Use Authorization (EUA).  This EUA will remain in effect (meaning this test can be used) for the duration of the COVID-19 declaration under Section 564(b)(1) of the Act, 21 U.S.C. section 360bbb-3(b)(1), unless the authorization is terminated or revoked sooner. Performed at [facility], [address] [place], [place] [address]   Lactic acid, plasma     Status: None   Collection Time: 03/08/19  8:50 AM  Result Value Ref Range   Lactic Acid, Venous 0.8 0.5 - 1.9 mmol/L    Comment: Performed at [facility], [address] [place], [place] [address]  Glucose, capillary     Status: Abnormal   Collection Time: 03/08/19 11:07 AM  Result Value Ref Range   Glucose-Capillary 125 (H) 70 - 99 mg/dL  MRSA PCR Screening     Status: None   Collection Time: 03/08/19 11:26 AM   Specimen: Nasal Mucosa; Nasopharyngeal  Result Value Ref Range   MRSA by PCR NEGATIVE NEGATIVE    Comment:        The GeneXpert MRSA Assay (FDA approved for NASAL specimens only), is one component of a comprehensive MRSA colonization surveillance program. It is not intended to diagnose MRSA infection nor to guide or monitor  treatment for MRSA infections. Performed at [facility], [address] [place], [place] [address]     Dg Chest Port 1 View  Result Date: 03/08/2019 CLINICAL DATA:  Respiratory distress EXAM: PORTABLE CHEST 1 VIEW COMPARISON:  11/16/2018 FINDINGS: Mildly low lung volumes. Biapical scarring with left apical sutures. There is no edema, consolidation, effusion, or pneumothorax. Normal heart size and mediastinal contours. Compression fractures and upper thoracic fusion hardware. IMPRESSION: Mildly low lung volumes with chronic biapical scarring. Electronically Signed   By: Marnee SpringJonathon  Watts M.D.   On: 03/08/2019 06:42    Review of Systems  Constitutional: Positive for malaise/fatigue.  HENT: Negative.   Eyes: Negative.   Respiratory: Positive for cough and shortness of breath.   Cardiovascular: Positive for leg swelling.  Gastrointestinal: Positive for heartburn.  Genitourinary: Negative.   Musculoskeletal: Negative.   Skin: Negative.   Neurological: Negative.   Endo/Heme/Allergies: Negative.   Psychiatric/Behavioral: The patient is nervous/anxious.   All other systems reviewed and are negative.  Blood pressure 110/82, pulse 98, temperature 97.8 F (36.6 C), temperature source Axillary, resp. rate (!) 26, height 4\' 9"  (1.448 m), weight 51.3 kg, SpO2 99 %. Physical Exam General:  Acute on chronically ill appearing female, laying in bed, on BiPAP, no respiratory distress Neuro:  Awake, A&O x4, no  focal deficits HEENT:  Atraumatic, normocephalic, PERRL, sclera anicteric Neck: Supple, no JVD, trachea midline, no crepitus. Cardiovascular:  RRR, s1s2, no M/R/G, 2+ pulses throughout Lungs:   Distant breath sounds, good air entry bilaterally, no is noted.  Remarkably no increased work of breathing  Abdomen:  Soft, nontender, nondistended, no guarding or rebound tenderness, BS+ x4 Musculoskeletal:  Normal bulk and tone, no deformities, 1+ edema bilateral LE Skin:  Warm/dry.  No  obvious rashes, lesions, or ulcerations   Assessment/Plan: 1.  Acute on chronic respiratory failure, hypercarbic as well as hypoxic: Trigger mechanism unclear.  He does not have evidence of acute infection.  She seems to have responded best to Lasix IV and suspect she may have had mild diastolic dysfunction issue.  Anxiety is also playing a part.  She is currently not requiring increase FiO2.  Will transition off of BiPAP as tolerated.  2.  Acute exacerbation of COPD: This appears to be mild.  She has history of BOOP/COP this does not appear to be flaring present.  As noted above no acute infection is noted.  Continue nebulization treatments, consider tapering off steroids quickly.  Currently see no need for antibiotics.  3.  Diastolic heart failure with mild decompensation: Lasix IV x1 was given today.  She had good results with this.  Consider repeating.  4.  Tobacco dependence due to cigarettes: Patient was counseled regards to discontinuation of smoking total counseling time 3 to 5 minutes.   Disposition: Stepdown Goals of care: Full code Prophylaxis: Lovenox subcu   C. Danice Goltz, MD [facility] 03/08/2019, 2:27 PM

## 2019-03-08 NOTE — ED Triage Notes (Signed)
Per EMS pt found to be in extreme resp distress by family around 4am. Called 911. Pt arrived on 15L and c/0 inability to breath

## 2019-03-08 NOTE — Progress Notes (Signed)
AC will notify primary RN that IV team RN can't get to start IV at this time. Will follow up.

## 2019-03-08 NOTE — H&P (Addendum)
[facility] [place] [facility]   PATIENT NAME: Caitlyn Evans    MR#:  [id number]  DATE OF BIRTH:  22-Jul-1966  DATE OF ADMISSION:  03/08/2019  PRIMARY CARE PHYSICIAN: Corky DownsMasoud, Javed, MD   REQUESTING/REFERRING PHYSICIAN: Dr Jene Everyobert Kinner  CHIEF COMPLAINT:   Chief Complaint  Patient presents with  . Respiratory Distress    HISTORY OF PRESENT ILLNESS:  Caitlyn Evans  is a 53 y.o. female with a known history of COPD and BOOP presenting with shortness of breath going on for a few days.  She has been coughing but has been a dry cough.  She has a lot of wheezing.  No fever or chills.  She was placed on BiPAP last night and has been on BiPAP since then.  Patient is hesitant upon coming off the BiPAP because she cannot breathe.  She states that when she gets like this sometimes she needs to be placed on life support.  Hospitalist services were contacted for further evaluation.  PAST MEDICAL HISTORY:   Past Medical History:  Diagnosis Date  . Anxiety    panic attacks  . Asthma   . BOOP (bronchiolitis obliterans with organizing pneumonia) (HCC)   . CHF (congestive heart failure) (HCC)   . Dysphagia, pharyngoesophageal phase 05/30/2015  . Emphysema lung (HCC) 05/30/2015   on steroids and nebulizer for copd issues.  09/15/18  . GERD (gastroesophageal reflux disease)   . Hypercholesterolemia   . Hypertension   . Lung mass   . Migraines   . Motion sickness    all moving vehicles  . Myocardial infarction (HCC) 2012  . Respiratory failure with hypercapnia (HCC) 08/2018   was hospitalized and on bipap. steroids and nebulizer treatments helped  . Seasonal allergies   . Shortness of breath dyspnea    1 flight-stairs  . Sternal fracture     PAST SURGICAL HISTORY:   Past Surgical History:  Procedure Laterality Date  . BACK SURGERY  10/2018   spinal fusion at [facility]  . COLONOSCOPY WITH PROPOFOL N/A 06/02/2015   Procedure: COLONOSCOPY WITH PROPOFOL;  Surgeon:  Midge Miniumarren Wohl, MD;  [facility];  Service: Endoscopy;  Laterality: N/A;  . ESOPHAGOGASTRODUODENOSCOPY (EGD) WITH PROPOFOL N/A 06/02/2015   Procedure: ESOPHAGOGASTRODUODENOSCOPY (EGD) WITH PROPOFOL withdialation;  Surgeon: Midge Miniumarren Wohl, MD;  [facility];  Service: Endoscopy;  Laterality: N/A;  . ESOPHAGOGASTRODUODENOSCOPY (EGD) WITH PROPOFOL N/A 11/05/2017   Procedure: ESOPHAGOGASTRODUODENOSCOPY (EGD) WITH PROPOFOL;  Surgeon: Pasty Spillersahiliani, Varnita B, MD;  Location: [facility];  Service: Endoscopy;  Laterality: N/A;  . HIP PINNING,CANNULATED Right 11/17/2018   Procedure: CANNULATED HIP PINNING;  Surgeon: Signa KellPatel, Sunny, MD;  Location: [facility];  Service: Orthopedics;  Laterality: Right;  . KYPHOPLASTY N/A 09/17/2018   Procedure: KYPHOPLASTY T6;  Surgeon: Kennedy BuckerMenz, Michael, MD;  Location: [facility];  Service: Orthopedics;  Laterality: N/A;  . LUNG SURGERY Left    Upper lobe removed  . OOPHORECTOMY Left   . VAGINAL HYSTERECTOMY      SOCIAL HISTORY:   Social History   Tobacco Use  . Smoking status: Current Some Day Smoker    Packs/day: 1.00    Years: 30.00    Pack years: 30.00    Types: Cigarettes  . Smokeless tobacco: Never Used  . Tobacco comment: quit some time in 2012  Substance Use Topics  . Alcohol use: No    Alcohol/week: 0.0 standard drinks    Comment: rare consumption    FAMILY HISTORY:   Family  History  Problem Relation Age of Onset  . COPD Mother   . Heart attack Mother   . Stroke Father   . COPD Father     DRUG ALLERGIES:   Allergies  Allergen Reactions  . Codeine Hives, Nausea And Vomiting and Nausea Only  . Penicillins Hives and Other (See Comments)    Has patient had a PCN reaction causing immediate rash, facial/tongue/throat swelling, SOB or lightheadedness with hypotension: No Has patient had a PCN reaction causing severe rash involving mucus membranes or skin necrosis: No Has patient had a PCN reaction that required hospitalization  No Has patient had a PCN reaction occurring within the last 10 years: No If all of the above answers are "NO", then may proceed with Cephalosporin use. Other reaction(s): Other (See Comments) Has patient had a PCN    REVIEW OF SYSTEMS:  CONSTITUTIONAL: No fever, chills or sweats.  Positive for weight loss.  Positive for fatigue.  Fatigue or weakness.  EYES: Has cataracts. EARS, NOSE, AND THROAT: No tinnitus or ear pain. No sore throat.  Positive for runny nose. RESPIRATORY: No cough, shortness of breath, wheezing or hemoptysis.  CARDIOVASCULAR: No chest pain, orthopnea, edema.  GASTROINTESTINAL: No nausea, vomiting,  or abdominal pain. No blood in bowel movements.  Patient had a few episodes of diarrhea GENITOURINARY: No dysuria, hematuria.  ENDOCRINE: No polyuria, nocturia,  HEMATOLOGY: No anemia, easy bruising or bleeding SKIN: No rash or lesion. MUSCULOSKELETAL: Positive for back pain NEUROLOGIC: No tingling, numbness, weakness.  PSYCHIATRY: History of anxiety and depression.   MEDICATIONS AT HOME:   Prior to Admission medications   Medication Sig Start Date End Date Taking? Authorizing Provider  acetaminophen (TYLENOL) 650 MG CR tablet Take 650 mg by mouth every 4 (four) hours as needed for pain.   Yes [provider]  ALPRAZolam Duanne Moron) 0.5 MG tablet Take 0.5 mg by mouth 3 (three) times daily. 06/10/18  Yes [provider]  Ascorbic Acid (VITAMIN C) 1000 MG tablet Take 1,000 mg by mouth daily.   Yes [provider]  budesonide-formoterol (SYMBICORT) 160-4.5 MCG/ACT inhaler Inhale 1 puff into the lungs 2 (two) times daily.   Yes [provider]  diltiazem (CARDIZEM CD) 120 MG 24 hr capsule Take 120 mg by mouth daily.  11/26/17  Yes [provider]  donepezil (ARICEPT) 10 MG tablet Take 10 mg by mouth daily.    Yes [provider]  DULoxetine (CYMBALTA) 30 MG capsule Take 60 mg by mouth daily.    Yes [provider]   ipratropium-albuterol (DUONEB) 0.5-2.5 (3) MG/3ML SOLN Inhale 3 mLs into the lungs every 4 (four) hours as needed (for wheezing/shortness of breath). 05/13/16  Yes Gladstone Lighter, MD  lidocaine (LIDODERM) 5 % Place 1 patch onto the skin daily. Remove & Discard patch within 12 hours or as directed by MD 10/05/18  Yes Gladstone Lighter, MD  meloxicam (MOBIC) 7.5 MG tablet Take 7.5 mg by mouth daily.   Yes [provider]  naloxone (NARCAN) nasal spray 4 mg/0.1 mL Place 1 spray into the nose once. 11/10/18  Yes [provider]  nebivolol (BYSTOLIC) 10 MG tablet Take 10 mg by mouth daily.   Yes [provider]  pregabalin (LYRICA) 100 MG capsule Take 100 mg by mouth 2 (two) times daily. 11/10/18 11/10/19 Yes [provider]  rosuvastatin (CRESTOR) 10 MG tablet Take 10 mg by mouth daily.  10/10/17  Yes [provider]      VITAL SIGNS:  Blood pressure 107/67, pulse 73, temperature 98.2 F (36.8 C), temperature source Oral, resp. rate 19, height 4\' 9"  (1.448 m), weight 51.3 kg, SpO2 100 %.  PHYSICAL EXAMINATION:  GENERAL:  53 y.o.-year-old patient lying in the bed with no acute distress.  EYES: Pupils equal, round, reactive to light and accommodation. No scleral icterus. Extraocular muscles intact.  HEENT: Head atraumatic, normocephalic. Oropharynx and nasopharynx clear.  NECK:  Supple, no jugular venous distention. No thyroid enlargement, no tenderness.  LUNGS: Decreased breath sounds bilaterally, positive inspiratory and expiratory wheezing throughout entire lung field.  No rales,rhonchi or crepitation.  Positive use of accessory muscles of respiration.  CARDIOVASCULAR: S1, S2 normal. No murmurs, rubs, or gallops.  ABDOMEN: Soft, nontender, nondistended. Bowel sounds present. No organomegaly or mass.  EXTREMITIES: No pedal edema, cyanosis, or clubbing.  NEUROLOGIC: Cranial nerves II through XII are intact. Muscle strength 5/5 in all extremities.  Sensation intact. Gait not checked.  PSYCHIATRIC: The patient is alert and oriented x 3.  SKIN: Small scabs on the arms.  No signs of infection  LABORATORY PANEL:   CBC Recent Labs  Lab 03/08/19 0607  WBC 10.8*  HGB 12.1  HCT 39.8  PLT 209   ------------------------------------------------------------------------------------------------------------------  Chemistries  Recent Labs  Lab 03/08/19 0607  NA 142  K 3.7  CL 107  CO2 27  GLUCOSE 108*  BUN 36*  CREATININE 0.36*  CALCIUM 8.1*  AST 17  ALT 14  ALKPHOS 153*  BILITOT 0.4   ------------------------------------------------------------------------------------------------------------------  Cardiac Enzymes Recent Labs  Lab 03/08/19 0607  TROPONINI <0.03   ------------------------------------------------------------------------------------------------------------------  RADIOLOGY:  Dg Chest Port 1 View  Result Date: 03/08/2019 CLINICAL DATA:  Respiratory distress EXAM: PORTABLE CHEST 1 VIEW COMPARISON:  11/16/2018 FINDINGS: Mildly low lung volumes. Biapical scarring with left apical sutures. There is no edema, consolidation, effusion, or pneumothorax. Normal heart size and mediastinal contours. Compression fractures and upper thoracic fusion hardware. IMPRESSION: Mildly low lung volumes with chronic biapical scarring. Electronically Signed   By: Marnee SpringJonathon  Watts M.D.   On: 03/08/2019 06:42    EKG:   Sinus rhythm 63 bpm no acute ST-T wave changes.   IMPRESSION AND PLAN:   1.  Acute hypoxic respiratory failure.  Patient currently on BiPAP and is hesitant on trying to taper off because she cannot breathe still.  She states that when she gets like this sometimes she needs to be intubated.  I will admit to the CCU stepdown.  Case discussed with critical care specialist Dr. Sung AmabileSimonds to see the patient in the unit.  We will get an ABG. 2.  COPD exacerbation and history of Boop.  We will put on Solu-Medrol 40 mg every  12 hours, DuoNeb nebulizer solution and budesonide nebulizers. 3.  Chronic diastolic congestive heart failure.  I will give 1 dose of Lasix to see if this helps her breathing.  Patient does not take Lasix at home. 4.  Hypertension.  I will hold Bystolic today and recheck blood pressure throughout the day.  Can continue Cardizem for now. 5.  Chronic back pain continue Lidoderm patch and Lyrica 6.  Anxiety depression continue Xanax 7.  Hyperlipidemia unspecified on Crestor 8. covid 19 negative 9.  Tobacco abuse.  Smoking cessation counseling done 4 minutes by me.  Since the patient only smokes a few cigarettes a day no need for nicotine patch.  All the records are reviewed and case discussed with ED provider. Management plans discussed with the patient, family and they are in  agreement.  CODE STATUS: Full code  TOTAL TIME TAKING CARE OF THIS PATIENT: 50 minutes.    Alford Highlandichard Chriselda Leppert M.D on 03/08/2019 at 10:33 AM  Between 7am to 6pm - Pager - [phone]  After 6pm call admission pager [phone]  [facility]  [phone]  CC: Primary care physician; Corky DownsMasoud, Javed, MD

## 2019-03-08 NOTE — ED Provider Notes (Signed)
Discussed with Dr. Earleen Newport for admission    Caitlyn Drafts, MD 03/08/19 1021

## 2019-03-08 NOTE — ED Notes (Signed)
Updated husband on plan of care.

## 2019-03-08 NOTE — ED Provider Notes (Signed)
[facility] Emergency Department Provider Note   ____________________________________________   First MD Initiated Contact with Patient 03/08/19 [phone]     (approximate)  I have reviewed the triage vital signs and the nursing notes.   HISTORY  Chief Complaint Respiratory Distress    HPI Caitlyn Evans is a 53 y.o. female brought to the ED from home via EMS with a chief complaint of respiratory distress.  Patient has a history of COPD on continuous oxygenation, BOOP, CHF who was found by her husband to be in respiratory distress around 4 AM.  Arrives to the ED on 15 L nonrebreather oxygen, tripoding and tachypneic.  Complains of chronic cough, chest tightness and shortness of breath.  Denies recent fever, abdominal pain, nausea, vomiting, diarrhea.  Denies recent travel, trauma or exposure to persons diagnosed with coronavirus.       Past Medical History:  Diagnosis Date  . Anxiety    panic attacks  . Asthma   . BOOP (bronchiolitis obliterans with organizing pneumonia) (HCC)   . CHF (congestive heart failure) (HCC)   . Dysphagia, pharyngoesophageal phase 05/30/2015  . Emphysema lung (HCC) 05/30/2015   on steroids and nebulizer for copd issues.  09/15/18  . GERD (gastroesophageal reflux disease)   . Hypercholesterolemia   . Hypertension   . Lung mass   . Migraines   . Motion sickness    all moving vehicles  . Myocardial infarction (HCC) 2012  . Respiratory failure with hypercapnia (HCC) 08/2018   was hospitalized and on bipap. steroids and nebulizer treatments helped  . Seasonal allergies   . Shortness of breath dyspnea    1 flight-stairs  . Sternal fracture     Patient Active Problem List   Diagnosis Date Noted  . Hip fracture (HCC) 11/16/2018  . Acute on chronic respiratory failure with hypoxia (HCC) 09/09/2018  . Esophagitis, unspecified   . Stomach irritation   . Hiatal hernia   . COPD (chronic obstructive pulmonary disease) (HCC)  07/19/2016  . Acute on chronic respiratory failure (HCC) 05/09/2016  . Noncompliance with medication regimen 05/09/2016  . COPD with acute exacerbation (HCC) 02/05/2016  . Acute exacerbation of chronic obstructive pulmonary disease (COPD) (HCC)   . Acute respiratory failure (HCC)   . Acute respiratory failure with hypoxia (HCC) 12/05/2015  . BOOP (bronchiolitis obliterans with organizing pneumonia) (HCC) 12/05/2015  . Depression, major, single episode, moderate (HCC) 07/14/2015  . Adjustment disorder with anxiety 07/14/2015  . COPD exacerbation (HCC) 07/14/2015  . Special screening for malignant neoplasms, colon   . Swallowing difficulty   . Loss of weight   . Esophagogastric ulcer   . Feline esophagus   . Hypertension 05/30/2015  . Emphysema lung (HCC) 05/30/2015  . Dysphagia, pharyngoesophageal phase 05/30/2015    Past Surgical History:  Procedure Laterality Date  . BACK SURGERY  10/2018   spinal fusion at [facility]  . COLONOSCOPY WITH PROPOFOL N/A 06/02/2015   Procedure: COLONOSCOPY WITH PROPOFOL;  Surgeon: Midge Miniumarren Wohl, MD;  [facility];  Service: Endoscopy;  Laterality: N/A;  . ESOPHAGOGASTRODUODENOSCOPY (EGD) WITH PROPOFOL N/A 06/02/2015   Procedure: ESOPHAGOGASTRODUODENOSCOPY (EGD) WITH PROPOFOL withdialation;  Surgeon: Midge Miniumarren Wohl, MD;  [facility];  Service: Endoscopy;  Laterality: N/A;  . ESOPHAGOGASTRODUODENOSCOPY (EGD) WITH PROPOFOL N/A 11/05/2017   Procedure: ESOPHAGOGASTRODUODENOSCOPY (EGD) WITH PROPOFOL;  Surgeon: Pasty Spillersahiliani, Varnita B, MD;  Location: [facility];  Service: Endoscopy;  Laterality: N/A;  . HIP PINNING,CANNULATED Right 11/17/2018   Procedure: CANNULATED HIP  PINNING;  Surgeon: Signa KellPatel, Sunny, MD;  Location: [facility];  Service: Orthopedics;  Laterality: Right;  . KYPHOPLASTY N/A 09/17/2018   Procedure: KYPHOPLASTY T6;  Surgeon: Kennedy BuckerMenz, Michael, MD;  Location: [facility];  Service: Orthopedics;  Laterality: N/A;  . LUNG SURGERY Left     Upper lobe removed  . OOPHORECTOMY Left   . VAGINAL HYSTERECTOMY      Prior to Admission medications   Medication Sig Start Date End Date Taking? Authorizing Provider  acetaminophen (TYLENOL) 650 MG CR tablet Take 650 mg by mouth every 4 (four) hours as needed for pain.    [provider]  ALPRAZolam Prudy Feeler(XANAX) 0.5 MG tablet Take 0.5 mg by mouth 3 (three) times daily. 06/10/18   [provider]  Ascorbic Acid (VITAMIN C) 1000 MG tablet Take 1,000 mg by mouth daily.    [provider]  diltiazem (CARDIZEM LA) 120 MG 24 hr tablet Take 120 mg by mouth daily. 11/26/17   [provider]  docusate sodium (COLACE) 100 MG capsule Take 1 capsule (100 mg total) by mouth 2 (two) times daily. 10/05/18   Enid BaasKalisetti, Radhika, MD  donepezil (ARICEPT) 10 MG tablet Take 10 mg by mouth daily.     [provider]  enoxaparin (LOVENOX) 40 MG/0.4ML injection Inject 0.4 mLs (40 mg total) into the skin daily. 11/18/18   Dedra SkeensMundy, Todd, PA-C  guaiFENesin (ROBITUSSIN) 100 MG/5ML SOLN Take 5 mLs (100 mg total) by mouth every 4 (four) hours as needed for cough or to loosen phlegm. 09/26/18   Shaune Pollackhen, Qing, MD  ipratropium-albuterol (DUONEB) 0.5-2.5 (3) MG/3ML SOLN Inhale 3 mLs into the lungs every 4 (four) hours as needed (for wheezing/shortness of breath). Patient taking differently: Inhale 3 mLs into the lungs every 4 (four) hours as needed. For wheezing/shortness of breath 05/13/16   Enid BaasKalisetti, Radhika, MD  lactulose (CHRONULAC) 10 GM/15ML solution Take 30 mLs (20 g total) by mouth 2 (two) times daily as needed for mild constipation. 10/05/18   Enid BaasKalisetti, Radhika, MD  lidocaine (LIDODERM) 5 % Place 1 patch onto the skin daily. Remove & Discard patch within 12 hours or as directed by MD 10/05/18   Enid BaasKalisetti, Radhika, MD  methocarbamol (ROBAXIN) 750 MG tablet Take 750 mg by mouth every 8 (eight) hours as needed for muscle spasms.    [provider]  metoprolol tartrate (LOPRESSOR) 25 MG  tablet Take 1 tablet (25 mg total) by mouth 2 (two) times daily. 11/25/17   Enid BaasKalisetti, Radhika, MD  morphine (MS CONTIN) 15 MG 12 hr tablet Take 15 mg by mouth every 12 (twelve) hours.    [provider]  naloxone [facility](NARCAN) nasal spray 4 mg/0.1 mL Place 1 spray into the nose once. 11/10/18   [provider]  oxyCODONE (OXY IR/ROXICODONE) 5 MG immediate release tablet Take 1 tablet (5 mg total) by mouth every 4 (four) hours as needed for severe pain (pain score 7-10). 11/18/18   Dedra SkeensMundy, Todd, PA-C  pantoprazole (PROTONIX) 40 MG tablet TAKE ONE TABLET BY MOUTH TWICE DAILY Patient taking differently: Take 40 mg by mouth 2 (two) times daily.  04/03/18   Pasty Spillersahiliani, Varnita B, MD  polyethylene glycol (MIRALAX / GLYCOLAX) packet Take 17 g by mouth daily. 10/05/18   Enid BaasKalisetti, Radhika, MD  predniSONE (DELTASONE) 10 MG tablet Take 10 mg by mouth 2 (two) times daily with a meal.    [provider]  pregabalin (LYRICA) 100 MG capsule Take 100 mg by mouth 2 (two) times daily. 11/10/18 11/10/19  [provider]  rosuvastatin (CRESTOR) 10 MG tablet Take 10 mg by mouth daily.  10/10/17   [provider]  senna-docusate (SENOKOT-S) 8.6-50 MG tablet Take 2 tablets by mouth 2 (two) times daily. 10/16/18 10/16/19  [provider]  traMADol (ULTRAM) 50 MG tablet Take 1 tablet (50 mg total) by mouth every 6 (six) hours as needed for moderate pain. 11/18/18   Dedra SkeensMundy, Todd, PA-C    Allergies Codeine and Penicillins  Family History  Problem Relation Age of Onset  . COPD Mother   . Heart attack Mother   . Stroke Father   . COPD Father     Social History Social History   Tobacco Use  . Smoking status: Former Smoker    Packs/day: 1.00    Years: 30.00    Pack years: 30.00    Types: Cigarettes  . Smokeless tobacco: Never Used  . Tobacco comment: quit some time in 2012  Substance Use Topics  . Alcohol use: No    Alcohol/week: 0.0 standard drinks    Comment: rare  consumption  . Drug use: Yes    Types: Marijuana    Review of Systems  Constitutional: No fever/chills Eyes: No visual changes. ENT: No sore throat. Cardiovascular: Positive for chest pain. Respiratory: Positive for cough and shortness of breath. Gastrointestinal: No abdominal pain.  No nausea, no vomiting.  No diarrhea.  No constipation. Genitourinary: Negative for dysuria. Musculoskeletal: Negative for back pain. Skin: Negative for rash. Neurological: Negative for headaches, focal weakness or numbness.   ____________________________________________   PHYSICAL EXAM:  VITAL SIGNS: ED Triage Vitals  Enc Vitals Group     BP 03/08/19 0553 (!) 158/138     Pulse Rate 03/08/19 0550 74     Resp 03/08/19 0553 (!) 36     Temp --      Temp src --      SpO2 03/08/19 0550 100 %     Weight --      Height --      Head Circumference --      Peak Flow --      Pain Score --      Pain Loc --      Pain Edu? --      Excl. in GC? --     Constitutional: Alert and oriented.  Ill appearing and in moderate acute distress. Eyes: Conjunctivae are normal. PERRL. EOMI. Head: Atraumatic. Nose: No congestion/rhinnorhea. Mouth/Throat: Mucous membranes are moist.  Oropharynx non-erythematous. Neck: No stridor.   Cardiovascular: Normal rate, regular rhythm. Grossly normal heart sounds.  Good peripheral circulation. Respiratory: Increased respiratory effort.  No retractions. Lungs with scattered rhonchi and wheezing. Gastrointestinal: Soft and nontender. No distention. No abdominal bruits. No CVA tenderness. Musculoskeletal: No lower extremity tenderness nor edema.  No joint effusions. Neurologic:  Normal speech and language. No gross focal neurologic deficits are appreciated.  Skin:  Skin is warm, dry and intact. No rash noted. Psychiatric: Mood and affect are normal. Speech and behavior are normal.  ____________________________________________   LABS (all labs ordered are listed, but only  abnormal results are displayed)  Labs Reviewed  CBC WITH DIFFERENTIAL/PLATELET - Abnormal; Notable for the following components:      Result Value   WBC 10.8 (*)    RDW 15.9 (*)    Neutro Abs 8.6 (*)    Monocytes Absolute 1.1 (*)    Abs Immature Granulocytes 0.08 (*)    All other components within normal limits  CULTURE, BLOOD (  ROUTINE X 2)  CULTURE, BLOOD (ROUTINE X 2)  SARS CORONAVIRUS 2 (HOSPITAL ORDER, PERFORMED IN [facility])  URINE CULTURE  COMPREHENSIVE METABOLIC PANEL  BRAIN NATRIURETIC PEPTIDE  TROPONIN I  LACTIC ACID, PLASMA  LACTIC ACID, PLASMA  URINALYSIS, COMPLETE (UACMP) WITH MICROSCOPIC   ____________________________________________  EKG  ED ECG REPORT I, SUNG,JADE J, the attending physician, personally viewed and interpreted this ECG.   Date: 03/08/2019  EKG Time: 0603  Rate: 63  Rhythm: normal EKG, normal sinus rhythm  Axis: Unremarkable  Intervals:none  ST&T Change: Nonspecific  ____________________________________________  RADIOLOGY  ED MD interpretation: No acute cardiopulmonary process  Official radiology report(s): Dg Chest Port 1 View  Result Date: 03/08/2019 CLINICAL DATA:  Respiratory distress EXAM: PORTABLE CHEST 1 VIEW COMPARISON:  11/16/2018 FINDINGS: Mildly low lung volumes. Biapical scarring with left apical sutures. There is no edema, consolidation, effusion, or pneumothorax. Normal heart size and mediastinal contours. Compression fractures and upper thoracic fusion hardware. IMPRESSION: Mildly low lung volumes with chronic biapical scarring. Electronically Signed   By: Monte Fantasia M.D.   On: 03/08/2019 06:42    ____________________________________________   PROCEDURES  Procedure(s) performed (including Critical Care):  Procedures  CRITICAL CARE Performed by: Paulette Blanch   Total critical care time: 30 minutes  Critical care time was exclusive of separately billable procedures and treating other patients.   Critical care was necessary to treat or prevent imminent or life-threatening deterioration.  Critical care was time spent personally by me on the following activities: development of treatment plan with patient and/or surrogate as well as nursing, discussions with consultants, evaluation of patient's response to treatment, examination of patient, obtaining history from patient or surrogate, ordering and performing treatments and interventions, ordering and review of laboratory studies, ordering and review of radiographic studies, pulse oximetry and re-evaluation of patient's condition. ____________________________________________   INITIAL IMPRESSION / ASSESSMENT AND PLAN / ED COURSE  As part of my medical decision making, I reviewed the following data within the [place] notes reviewed and incorporated, Labs reviewed, EKG interpreted, Old chart reviewed, Radiograph reviewed and Notes from prior ED visits     Reid BERTICE RISSE was evaluated in Emergency Department on 03/08/2019 for the symptoms described in the history of present illness. She was evaluated in the context of the global COVID-19 pandemic, which necessitated consideration that the patient might be at risk for infection with the SARS-CoV-2 virus that causes COVID-19. Institutional protocols and algorithms that pertain to the evaluation of patients at risk for COVID-19 are in a state of rapid change based on information released by regulatory bodies including the CDC and federal and state organizations. These policies and algorithms were followed during the patient's care in the ED.   53 year old female with COPD, BOOP, CHF who presents in respiratory distress. Differential includes, but is not limited to, viral syndrome, bronchitis including COPD exacerbation, pneumonia, reactive airway disease including asthma, CHF including exacerbation with or without pulmonary/interstitial edema, pneumothorax, ACS, thoracic  trauma, and pulmonary embolism.   Clinical Course as of Mar 08 715  Mon Mar 08, 2019  0629 Patient refused ABG.   [JS]  [id number] Patient sleeping on BiPAP.  Updated husband by telephone.  Anticipate hospitalization if patient is Covid negative.  Laboratory results pending.  Care will be signed out to the oncoming provider Dr. Corky Downs.   [JS]    Clinical Course User Index [JS] Paulette Blanch, MD     ____________________________________________   FINAL CLINICAL  IMPRESSION(S) / ED DIAGNOSES  Final diagnoses:  Respiratory distress  COPD exacerbation [facility])     ED Discharge Orders    None       Note:  This document was prepared using Dragon voice recognition software and may include unintentional dictation errors.   Irean Hong, MD 03/08/19 [phone]

## 2019-03-08 NOTE — ED Notes (Signed)
Lab at bedside

## 2019-03-09 DIAGNOSIS — J9601 Acute respiratory failure with hypoxia: Secondary | ICD-10-CM

## 2019-03-09 LAB — CBC
HCT: 41.2 % (ref 36.0–46.0)
Hemoglobin: 12.8 g/dL (ref 12.0–15.0)
MCH: 26.8 pg (ref 26.0–34.0)
MCHC: 31.1 g/dL (ref 30.0–36.0)
MCV: 86.4 fL (ref 80.0–100.0)
Platelets: 214 10*3/uL (ref 150–400)
RBC: 4.77 MIL/uL (ref 3.87–5.11)
RDW: 15.8 % — ABNORMAL HIGH (ref 11.5–15.5)
WBC: 6.7 10*3/uL (ref 4.0–10.5)
nRBC: 0 % (ref 0.0–0.2)

## 2019-03-09 LAB — BASIC METABOLIC PANEL
Anion gap: 10 (ref 5–15)
BUN: 31 mg/dL — ABNORMAL HIGH (ref 6–20)
CO2: 29 mmol/L (ref 22–32)
Calcium: 8.3 mg/dL — ABNORMAL LOW (ref 8.9–10.3)
Chloride: 101 mmol/L (ref 98–111)
Creatinine, Ser: 0.31 mg/dL — ABNORMAL LOW (ref 0.44–1.00)
GFR calc Af Amer: >60 mL/min (ref >60)
GFR calc non Af Amer: >60 mL/min (ref >60)
Glucose, Bld: 114 mg/dL — ABNORMAL HIGH (ref 70–99)
Potassium: 3.7 mmol/L (ref 3.5–5.1)
Sodium: 140 mmol/L (ref 135–145)

## 2019-03-09 LAB — URINE CULTURE
Culture: <10000 — AB
Special Requests: NORMAL

## 2019-03-09 MED ORDER — IPRATROPIUM-ALBUTEROL 0.5-2.5 (3) MG/3ML IN SOLN: 3.0000 mL | Freq: Three times a day (TID) | RESPIRATORY_TRACT | Status: DC

## 2019-03-09 MED ORDER — PREDNISONE 20 MG PO TABS
40.0000 mg | ORAL_TABLET | Freq: Every day | ORAL | Status: DC
  Administered 2019-03-10: 09:00:00 40 mg via ORAL
  Filled 2019-03-09: qty 2

## 2019-03-09 MED ORDER — ALBUTEROL SULFATE (2.5 MG/3ML) 0.083% IN NEBU: 2.5000 mg | INHALATION_SOLUTION | RESPIRATORY_TRACT | Status: DC | PRN

## 2019-03-09 MED ORDER — IPRATROPIUM-ALBUTEROL 0.5-2.5 (3) MG/3ML IN SOLN
3.0000 mL | Freq: Four times a day (QID) | RESPIRATORY_TRACT | Status: DC
  Administered 2019-03-09 – 2019-03-10 (×4): 3 mL via RESPIRATORY_TRACT
  Filled 2019-03-09 (×5): qty 3

## 2019-03-09 MED ORDER — BUDESONIDE 0.25 MG/2ML IN SUSP
0.2500 mg | Freq: Four times a day (QID) | RESPIRATORY_TRACT | Status: DC
  Administered 2019-03-09 – 2019-03-10 (×4): 0.25 mg via RESPIRATORY_TRACT
  Filled 2019-03-09 (×5): qty 2

## 2019-03-09 NOTE — Progress Notes (Signed)
She is much improved today. Exam continues to reveal scattered coarse wheezes but she is in no distress on [place] O2.  I have placed order for transfer to [place] floor.  I again counseled her regarding need for smoking cessation.  Continue methylprednisolone through today then transition to prednisone tomorrow.     She has been seen by Dr. Ashby Dawes one time in the past (in 2017) and has seen Dr. Raul Del in 2018.  She needs outpatient follow-up with pulmonary medicine.  That can be arranged with either of the aforementioned physicians.  After transfer out of ICU, PCCM will sign off. Please call if we can be of further assistance   Merton Border, MD PCCM service Mobile [phone] Pager [phone] 03/09/2019 5:14 PM

## 2019-03-09 NOTE — Progress Notes (Signed)
   03/09/19 1000  Clinical Encounter Type  Visited With Patient  Visit Type Follow-up  Referral From 9  Ch assisted the pt complete an AD. The document can be found in her chart. Pt shared her tiredness from repeated hospitalizations and surgeries that made her want to update her living will. She does not want to be put on life support if the situations will arise. Pt showed a great strength and resiliency in her attitude to stay positive. Pt wishes to attend the graduation of her high school granddaughter and this motivates her to work towards her recovery.

## 2019-03-09 NOTE — Progress Notes (Signed)
[facility] [place] [facility]   PATIENT NAME: Caitlyn Evans    MR#:  [id number]  DATE OF BIRTH:  02-Feb-1966  SUBJECTIVE:  CHIEF COMPLAINT:   Chief Complaint  Patient presents with  . Respiratory Distress  sitting in chair. Not needing BiPAP REVIEW OF SYSTEMS:  Review of Systems  Constitutional: Negative for diaphoresis, fever, malaise/fatigue and weight loss.  HENT: Negative for ear discharge, ear pain, hearing loss, nosebleeds, sore throat and tinnitus.   Eyes: Negative for blurred vision and pain.  Respiratory: Positive for cough and shortness of breath. Negative for hemoptysis and wheezing.   Cardiovascular: Negative for chest pain, palpitations, orthopnea and leg swelling.  Gastrointestinal: Negative for abdominal pain, blood in stool, constipation, diarrhea, heartburn, nausea and vomiting.  Genitourinary: Negative for dysuria, frequency and urgency.  Musculoskeletal: Negative for back pain and myalgias.  Skin: Negative for itching and rash.  Neurological: Negative for dizziness, tingling, tremors, focal weakness, seizures, weakness and headaches.  Psychiatric/Behavioral: Negative for depression. The patient is not nervous/anxious.   :  DRUG ALLERGIES:   Allergies  Allergen Reactions  . Codeine Hives, Nausea And Vomiting and Nausea Only  . Penicillins Hives and Other (See Comments)    Has patient had a PCN reaction causing immediate rash, facial/tongue/throat swelling, SOB or lightheadedness with hypotension: No Has patient had a PCN reaction causing severe rash involving mucus membranes or skin necrosis: No Has patient had a PCN reaction that required hospitalization No Has patient had a PCN reaction occurring within the last 10 years: No If all of the above answers are "NO", then may proceed with Cephalosporin use. Other reaction(s): Other (See Comments) Has patient had a PCN   VITALS:  Blood pressure 128/67, pulse 79, temperature 98.6 F (37  C), temperature source Oral, resp. rate 20, height 4\' 9"  (1.448 m), weight 51.3 kg, SpO2 99 %. PHYSICAL EXAMINATION:  Physical Exam HENT:     Head: Normocephalic and atraumatic.  Eyes:     Conjunctiva/sclera: Conjunctivae normal.     Pupils: Pupils are equal, round, and reactive to light.  Neck:     Musculoskeletal: Normal range of motion and neck supple.     Thyroid: No thyromegaly.     Trachea: No tracheal deviation.  Cardiovascular:     Rate and Rhythm: Normal rate and regular rhythm.     Heart sounds: Normal heart sounds.  Pulmonary:     Effort: Pulmonary effort is normal. No respiratory distress.     Breath sounds: Normal breath sounds. No wheezing.  Chest:     Chest wall: No tenderness.  Abdominal:     General: Bowel sounds are normal. There is no distension.     Palpations: Abdomen is soft.     Tenderness: There is no abdominal tenderness.  Musculoskeletal: Normal range of motion.  Skin:    General: Skin is warm and dry.     Findings: No rash.  Neurological:     Mental Status: She is alert and oriented to person, place, and time.     Cranial Nerves: No cranial nerve deficit.    LABORATORY PANEL:  Female CBC Recent Labs  Lab 03/09/19 0443  WBC 6.7  HGB 12.8  HCT 41.2  PLT 214   ------------------------------------------------------------------------------------------------------------------ Chemistries  Recent Labs  Lab 03/08/19 0607 03/09/19 0443  NA 142 140  K 3.7 3.7  CL 107 101  CO2 27 29  GLUCOSE 108* 114*  BUN 36* 31*  CREATININE 0.36* 0.31*  CALCIUM 8.1* 8.3*  AST 17  --   ALT 14  --   ALKPHOS 153*  --   BILITOT 0.4  --    RADIOLOGY:  No results found. ASSESSMENT AND PLAN:   1.  Acute hypoxic respiratory failure: off BiPAP, on [place]. 2.  COPD exacerbation and history of BOOP - contionue Solu-Medrol 40 mg every 12 hours, DuoNeb nebulizer solution and budesonide nebulizers. 3.  Chronic diastolic congestive heart failure - can consider lasix  prn 4.  Hypertension - continue Cardizem for now. 5.  Chronic back pain continue Lidoderm patch and Lyrica 6.  Anxiety depression continue Xanax 7.  Hyperlipidemia unspecified on Crestor 8. covid 19 negative 9.  Tobacco abuse.  Smoking cessation counseling done 4 minutes by me.  Since the patient only smokes a few cigarettes a day no need for nicotine patch.     All the records are reviewed and case discussed with Care Management/Social Worker. Management plans discussed with the patient, nursing and they are in agreement.  CODE STATUS: Full Code  TOTAL TIME TAKING CARE OF THIS PATIENT: 35 minutes.   More than 50% of the time was spent in counseling/coordination of care: YES  POSSIBLE D/C IN 1-2 DAYS, DEPENDING ON CLINICAL CONDITION.   Max Sane M.D on 03/09/2019 at 3:58 PM  Between 7am to 6pm - Pager - [phone]  After 6pm go to [email or web address] - Proofreader  [facility] [place] [facility]  Office  [phone]  CC: Primary care physician; Cletis Athens, MD  Note: This dictation was prepared with Dragon dictation along with smaller phrase technology. Any transcriptional errors that result from this process are unintentional.

## 2019-03-10 MED ORDER — NICOTINE 21 MG/24HR TD PT24: 21.0000 mg | MEDICATED_PATCH | Freq: Every day | TRANSDERMAL | 0 refills | Status: AC

## 2019-03-10 MED ORDER — PREDNISONE 20 MG PO TABS: 40.0000 mg | ORAL_TABLET | Freq: Every day | ORAL | 0 refills | Status: AC

## 2019-03-10 NOTE — Progress Notes (Signed)
Discharge instructions given. Pt verbalizes understanding. Pt discharged home with family. Pt discharged home with family. Escorted to vehicle in wheelchair by nursing staff.

## 2019-03-10 NOTE — Discharge Summary (Signed)
Caitlyn Evans at [facility] NAME: Caitlyn Evans    MR#:  [id number]  DATE OF BIRTH:  Mar 13, 1966  DATE OF ADMISSION:  03/08/2019 ADMITTING PHYSICIAN: Caitlyn Grayer, MD  DATE OF DISCHARGE: 03/10/2019  PRIMARY CARE PHYSICIAN: Caitlyn Athens, MD    ADMISSION DIAGNOSIS:  Respiratory distress [R06.03] COPD exacerbation ([place]) [J44.1]  DISCHARGE DIAGNOSIS:  Active Problems:   Acute respiratory failure with hypoxia ([place])   SECONDARY DIAGNOSIS:   Past Medical History:  Diagnosis Date  . Anxiety    panic attacks  . Asthma   . 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 CHF (congestive heart failure) ([place])   . Dysphagia, pharyngoesophageal phase 05/30/2015  . Emphysema lung ([place]) 05/30/2015   on steroids and nebulizer for copd issues.  09/15/18  . GERD (gastroesophageal reflux disease)   . Hypercholesterolemia   . Hypertension   . Lung mass   . Migraines   . Motion sickness    all moving vehicles  . Myocardial infarction ([place]) 2012  . Respiratory failure with hypercapnia ([place]) 08/2018   was hospitalized and on bipap. steroids and nebulizer treatments helped  . Seasonal allergies   . Shortness of breath dyspnea    1 flight-stairs  . Sternal fracture     HOSPITAL COURSE:  53 year old female with a history of tobacco dependence and COPD who presented to the hospital due to shortness of breath.  1.  Acute on chronic hypoxic respiratory failure: Patient was initially started on BiPAP.  She has been weaned to baseline nasal cannula 2 to 3 L.  Acute hypoxic respiratory failure was in the setting of acute exacerbation of COPD.  2.  Acute exacerbation of COPD with history of Boop: Patient was evaluated by intensivist.  She was placed on IV steroids, nebs and oxygen.  COPD exacerbation has improved.  She has no wheezing upon discharge.  She will continue with prednisone 40 mg for 4 more days.  She will continue nebulizer and  inhaler.  She does not want to see a pulmonologist as an outpatient which was advised by the pulmonologist who saw her while in the hospital.  3.  Chronic diastolic heart failure without signs of exacerbation  4.  Essential hypertension: She will continue Cardizem and Bystolic  5.  Mild dementia: Continue Aricept  6.  Chronic back pain: Continue lidocaine patch  7.Tobacco dependence: Patient is encouraged to quit smoking and willing to attempt to quit was assessed. Patient highly motivated.Counseling was provided for 4 minutes.  DISCHARGE CONDITIONS AND DIET:   Stable Heart healthy diet  CONSULTS OBTAINED:    DRUG ALLERGIES:   Allergies  Allergen Reactions  . Codeine Hives, Nausea And Vomiting and Nausea Only  . Penicillins Hives and Other (See Comments)    Has patient had a PCN reaction causing immediate rash, facial/tongue/throat swelling, SOB or lightheadedness with hypotension: No Has patient had a PCN reaction causing severe rash involving mucus membranes or skin necrosis: No Has patient had a PCN reaction that required hospitalization No Has patient had a PCN reaction occurring within the last 10 years: No If all of the above answers are "NO", then may proceed with Cephalosporin use. Other reaction(s): Other (See Comments) Has patient had a PCN    DISCHARGE MEDICATIONS:   Allergies as of 03/10/2019      Reactions   Codeine Hives, Nausea And Vomiting, Nausea Only   Penicillins Hives, Other (See Comments)   Has patient had  a PCN reaction causing immediate rash, facial/tongue/throat swelling, SOB or lightheadedness with hypotension: No Has patient had a PCN reaction causing severe rash involving mucus membranes or skin necrosis: No Has patient had a PCN reaction that required hospitalization No Has patient had a PCN reaction occurring within the last 10 years: No If all of the above answers are "NO", then may proceed with Cephalosporin use. Other reaction(s): Other  (See Comments) Has patient had a PCN      Medication List    TAKE these medications   acetaminophen 650 MG CR tablet Commonly known as: TYLENOL Take 650 mg by mouth every 4 (four) hours as needed for pain.   ALPRAZolam 0.5 MG tablet Commonly known as: XANAX Take 0.5 mg by mouth 3 (three) times daily.   budesonide-formoterol 160-4.5 MCG/ACT inhaler Commonly known as: SYMBICORT Inhale 1 puff into the lungs 2 (two) times daily.   diltiazem 120 MG 24 hr capsule Commonly known as: CARDIZEM CD Take 120 mg by mouth daily.   donepezil 10 MG tablet Commonly known as: ARICEPT Take 10 mg by mouth daily.   DULoxetine 30 MG capsule Commonly known as: CYMBALTA Take 60 mg by mouth daily.   ipratropium-albuterol 0.5-2.5 (3) MG/3ML Soln Commonly known as: DUONEB Inhale 3 mLs into the lungs every 4 (four) hours as needed (for wheezing/shortness of breath).   lidocaine 5 % Commonly known as: LIDODERM Place 1 patch onto the skin daily. Remove & Discard patch within 12 hours or as directed by MD   meloxicam 7.5 MG tablet Commonly known as: MOBIC Take 7.5 mg by mouth daily.   naloxone 4 MG/0.1ML Liqd nasal spray kit Commonly known as: NARCAN Place 1 spray into the nose once.   nebivolol 10 MG tablet Commonly known as: BYSTOLIC Take 10 mg by mouth daily.   nicotine 21 mg/24hr patch Commonly known as: Nicoderm CQ Place 1 patch (21 mg total) onto the skin daily.   predniSONE 20 MG tablet Commonly known as: DELTASONE Take 2 tablets (40 mg total) by mouth daily with breakfast for 3 days. Start taking on: March 11, 2019   pregabalin 100 MG capsule Commonly known as: LYRICA Take 100 mg by mouth 2 (two) times daily.   rosuvastatin 10 MG tablet Commonly known as: CRESTOR Take 10 mg by mouth daily.   vitamin C 1000 MG tablet Take 1,000 mg by mouth daily.         Today   CHIEF COMPLAINT:  Doing better no sob or wheezing wants to go home   VITAL SIGNS:  Blood pressure  129/79, pulse 68, temperature 98 F (36.7 C), temperature source Oral, resp. rate 16, height _0  (1.448 m), weight 51.3 kg, SpO2 96 %.   REVIEW OF SYSTEMS:  Review of Systems  Constitutional: Negative.  Negative for chills, fever and malaise/fatigue.  HENT: Negative.  Negative for ear discharge, ear pain, hearing loss, nosebleeds and sore throat.   Eyes: Negative.  Negative for blurred vision and pain.  Respiratory: Negative.  Negative for cough, hemoptysis, shortness of breath and wheezing.   Cardiovascular: Negative.  Negative for chest pain, palpitations and leg swelling.  Gastrointestinal: Negative.  Negative for abdominal pain, blood in stool, diarrhea, nausea and vomiting.  Genitourinary: Negative.  Negative for dysuria.  Musculoskeletal: Negative.  Negative for back pain.  Skin: Negative.   Neurological: Negative for dizziness, tremors, speech change, focal weakness, seizures and headaches.  Endo/Heme/Allergies: Negative.  Does not bruise/bleed easily.  Psychiatric/Behavioral: Negative.  Negative  for depression, hallucinations and suicidal ideas.     PHYSICAL EXAMINATION:  GENERAL:  53 y.o.-year-old patient lying in the bed with no acute distress.  NECK:  Supple, no jugular venous distention. No thyroid enlargement, no tenderness.  LUNGS: Normal breath sounds bilaterally, no wheezing, rales,rhonchi  No use of accessory muscles of respiration.  CARDIOVASCULAR: S1, S2 normal. No murmurs, rubs, or gallops.  ABDOMEN: Soft, non-tender, non-distended. Bowel sounds present. No organomegaly or mass.  EXTREMITIES: No pedal edema, cyanosis, or clubbing.  PSYCHIATRIC: The patient is alert and oriented x 3.  SKIN: No obvious rash, lesion, or ulcer.   DATA REVIEW:   CBC Recent Labs  Lab 03/09/19 0443  WBC 6.7  HGB 12.8  HCT 41.2  PLT 214    Chemistries  Recent Labs  Lab 03/08/19 0607 03/09/19 0443  NA 142 140  K 3.7 3.7  CL 107 101  CO2 27 29  GLUCOSE 108* 114*  BUN  36* 31*  CREATININE 0.36* 0.31*  CALCIUM 8.1* 8.3*  AST 17  --   ALT 14  --   ALKPHOS 153*  --   BILITOT 0.4  --     Cardiac Enzymes Recent Labs  Lab 03/08/19 0607  TROPONINI <0.03    Microbiology Results  _0 @  RADIOLOGY:  No results found.    Allergies as of 03/10/2019      Reactions   Codeine Hives, Nausea And Vomiting, Nausea Only   Penicillins Hives, Other (See Comments)   Has patient had a PCN reaction causing immediate rash, facial/tongue/throat swelling, SOB or lightheadedness with hypotension: No Has patient had a PCN reaction causing severe rash involving mucus membranes or skin necrosis: No Has patient had a PCN reaction that required hospitalization No Has patient had a PCN reaction occurring within the last 10 years: No If all of the above answers are "NO", then may proceed with Cephalosporin use. Other reaction(s): Other (See Comments) Has patient had a PCN      Medication List    TAKE these medications   acetaminophen 650 MG CR tablet Commonly known as: TYLENOL Take 650 mg by mouth every 4 (four) hours as needed for pain.   ALPRAZolam 0.5 MG tablet Commonly known as: XANAX Take 0.5 mg by mouth 3 (three) times daily.   budesonide-formoterol 160-4.5 MCG/ACT inhaler Commonly known as: SYMBICORT Inhale 1 puff into the lungs 2 (two) times daily.   diltiazem 120 MG 24 hr capsule Commonly known as: CARDIZEM CD Take 120 mg by mouth daily.   donepezil 10 MG tablet Commonly known as: ARICEPT Take 10 mg by mouth daily.   DULoxetine 30 MG capsule Commonly known as: CYMBALTA Take 60 mg by mouth daily.   ipratropium-albuterol 0.5-2.5 (3) MG/3ML Soln Commonly known as: DUONEB Inhale 3 mLs into the lungs every 4 (four) hours as needed (for wheezing/shortness of breath).   lidocaine 5 % Commonly known as: LIDODERM Place 1 patch onto the skin daily. Remove & Discard patch within 12 hours or as directed by MD   meloxicam 7.5 MG  tablet Commonly known as: MOBIC Take 7.5 mg by mouth daily.   naloxone 4 MG/0.1ML Liqd nasal spray kit Commonly known as: NARCAN Place 1 spray into the nose once.   nebivolol 10 MG tablet Commonly known as: BYSTOLIC Take 10 mg by mouth daily.   nicotine 21 mg/24hr patch Commonly known as: Nicoderm CQ Place 1 patch (21 mg total) onto the skin daily.   predniSONE 20 MG tablet Commonly  known as: DELTASONE Take 2 tablets (40 mg total) by mouth daily with breakfast for 3 days. Start taking on: March 11, 2019   pregabalin 100 MG capsule Commonly known as: LYRICA Take 100 mg by mouth 2 (two) times daily.   rosuvastatin 10 MG tablet Commonly known as: CRESTOR Take 10 mg by mouth daily.   vitamin C 1000 MG tablet Take 1,000 mg by mouth daily.          Management plans discussed with the patient and she  is in agreement. Stable for discharge home  Patient should follow up with pcp  CODE STATUS:     Code Status Orders  (From admission, onward)         Start     Ordered   03/08/19 1030  Full code  Continuous     03/08/19 1030        Code Status History    Date Active Date Inactive Code Status Order ID Comments User Context   11/16/2018 1854 11/20/2018 1607 Full Code [id number]  Bettey Costa, MD ED   09/22/2018 2017 09/26/2018 1435 Full Code [id number]  Hillary Bow, MD ED   09/17/2018 1005 09/17/2018 1334 Full Code [id number]  Hessie Knows, MD Inpatient   09/13/2018 1104 09/15/2018 1529 Full Code [id number]  Caitlyn Grayer, MD ED   09/09/2018 2113 09/10/2018 1939 Full Code [id number]  Mayo, Pete Pelt, MD ED   08/31/2018 1507 09/02/2018 1509 Full Code [id number]  Saundra Shelling, MD ED   11/18/2017 2000 11/25/2017 1802 Full Code [id number]  Saundra Shelling, MD ED   07/01/2017 2023 07/04/2017 1648 Full Code [id number]  Vaughan Basta, MD ED   07/19/2016 1148 07/20/2016 2010 Full Code [id number]  Bettey Costa, MD Inpatient   05/09/2016 1440 05/13/2016 1437 Full Code [id number]   Wilhelmina Mcardle, MD ED   02/05/2016 1914 02/08/2016 1452 Full Code [id number]  Nicholes Mango, MD Inpatient   12/05/2015 2302 12/09/2015 1535 Full Code [id number]  Lance Coon, MD Inpatient   Advance Care Planning Activity    Advance Directive Documentation     Most Recent Value  Type of Advance Directive  Healthcare Power of Attorney, Living will  Pre-existing out of facility DNR order (yellow form or pink MOST form)  -  "MOST" Form in Place?  -      TOTAL TIME TAKING CARE OF THIS PATIENT: 38 minutes.    Note: This dictation was prepared with Dragon dictation along with smaller phrase technology. Any transcriptional errors that result from this process are unintentional.  Bettey Costa M.D on 03/10/2019 at 10:33 AM  Between 7am to 6pm - Pager - [phone] After 6pm go to [email or web address] - password [email or web address] [facility]  Office  [phone]  CC: Primary care physician; Caitlyn Athens, MD

## 2019-03-10 NOTE — TOC Transition Note (Signed)
Transition of Care [facility]) - CM/SW Discharge Note   Patient Details  Name: Caitlyn Evans MRN: [id number] Date of Birth: 1965/12/23  [facility] CM/SW Contact:  Shelbie Hutching, RN Phone Number: 03/10/2019, 9:57 AM   Clinical Narrative:     Patient will discharge home today.  No discharge needs.   Final next level of care: Home/Self Care Barriers to Discharge: Barriers Resolved   Patient Goals and CMS Choice Patient states their goals for this hospitalization and ongoing recovery are:: Patient ready to go home      Discharge Placement                       Discharge Plan and Services   Discharge Planning Services: CM Consult                                 Social Determinants of Health (SDOH) Interventions     Readmission Risk Interventions No flowsheet data found.

## 2019-03-10 NOTE — Discharge Instructions (Signed)
Acute Respiratory Distress Syndrome, Adult ° °Acute respiratory distress syndrome is a life-threatening condition in which fluid collects in the lungs. This prevents the lungs from filling with air and passing oxygen into the blood. This can cause the lungs and other vital organs to fail. The condition usually develops following an infection, illness, surgery, or injury. °What are the causes? °This condition may be caused by: °· An infection, such as sepsis or pneumonia. °· A serious injury to the head or chest. °· Severe bleeding from an injury. °· A major surgery. °· Breathing in harmful chemicals or smoke. °· Blood transfusions. °· A blood clot in the lungs. °· Breathing in vomit (aspiration). °· Near-drowning. °· Inflammation of the pancreas (pancreatitis). °· A drug overdose. °What are the signs or symptoms? °Sudden shortness of breath and rapid breathing are the main symptoms of this condition. Other symptoms may include: °· A fast or irregular heartbeat. °· Skin, lips, or fingernails that look blue (cyanosis). °· Confusion. °· Tiredness or loss of energy. °· Chest pain, particularly while taking a breath. °· Coughing. °· Restlessness or anxiety. °· Fever. This is usually present if there is an underlying infection, such as pneumonia. °How is this diagnosed? °This condition is diagnosed based on: °· Your symptoms. °· Medical history. °· A physical exam. During the exam, your health care provider will listen to your heart and check for crackling or wheezing sounds in your lungs. °You may also have other tests to confirm the diagnosis and measure how well your lungs are working. These may include: °· Measuring the amount of oxygen in your blood. Your health care provider will use two methods to do this procedure: °? A small device (pulse oximeter) that is placed on your finger, earlobe, or toe. °? An arterial blood gas test. A sample of blood is taken from an artery and tested for oxygen levels. °· Blood  tests. °· Chest X-rays or CT scans to look for fluid in the lungs. °· Taking a sample of your sputum to test for infection. °· Heart test, such as an echocardiogram or electrocardiogram. This is done to rule out any heart problems (such as heart failure) that may be causing your symptoms. °· Bronchoscopy. During this test, a thin, flexible tube with a light is passed into the mouth or nose, down the windpipe, and into the lungs. °How is this treated? °Treatment depends on the cause of your condition. The goal is to support you while your lungs heal and the underlying cause is treated. Treatment may include: °· Oxygen therapy. This may be done through: °? A tube in your nose or a face mask. °? A ventilator. This device helps move air into and out of your lungs through a breathing tube that is inserted into your mouth or nose. °· Continuous positive airway pressure (CPAP). This treatment uses mild air pressure to keep the airways open. A mask or other device will be placed over your nose or mouth. °· Tracheostomy. During this procedure, a small cut is made in your neck to create an opening to your windpipe. A breathing tube is placed directly into your windpipe. The breathing tube is connected to a ventilator. This is done if you have problems with your airway or if you need a ventilator for a long period of time. °· Positioning you to lie on your stomach (prone position). °· Medicines, such as: °? Sedatives to help you relax. °? Blood pressure medicines. °? Antibiotics to treat infection. °? Blood   thinners to prevent blood clots. °? Diuretics to help prevent excess fluid. °· Fluids and nutrients given through an IV tube. °· Wearing compression stockings on your legs to prevent blood clots. °· Extra corporeal membrane oxygenation (ECMO). This treatment takes blood outside your body, adds oxygen, and removes carbon dioxide. The blood is then returned to your body. This treatment is only used in severe cases. °Follow  these instructions at home: °· Take over-the-counter and prescription medicines only as told by your health care provider. °· Do not use any products that contain nicotine or tobacco, such as cigarettes and e-cigarettes. If you need help quitting, ask your health care provider. °· Limit alcohol intake to no more than 1 drink per day for nonpregnant women and 2 drinks per day for men. One drink equals 12 oz of beer, 5 oz of wine, or 1½ oz of hard liquor. °· Ask friends and family to help you if daily activities make you tired. °· Attend any pulmonary rehabilitation as told by your health care provider. This may include: °? Education about your condition. °? Exercises. °? Breathing training. °? Counseling. °? Learning techniques to conserve energy. °? Nutrition counseling. °· Keep all follow-up visits as told by your health care provider. This is important. °Contact a health care provider if: °· You become short of breath during activity or while resting. °· You develop a cough that does not go away. °· You have a fever. °· Your symptoms do not get better or they get worse. °· You become anxious or depressed. °Get help right away if: °· You have sudden shortness of breath. °· You develop sudden chest pain that does not go away. °· You develop a rapid heart rate. °· You develop swelling or pain in one of your legs. °· You cough up blood. °· You have trouble breathing. °· Your skin, lips, or fingernails turn blue. °These symptoms may represent a serious problem that is an emergency. Do not wait to see if the symptoms will go away. Get medical help right away. Call your local emergency services (911 in the [place] Do not drive yourself to the hospital. °Summary °· Acute respiratory distress syndrome is a life-threatening condition in which fluid collects in the lungs, which leads the lungs and other vital organs to fail. °· This condition usually develops following an infection, illness, surgery, or injury. °· Sudden  shortness of breath and rapid breathing are the main symptoms of acute respiratory distress syndrome. °· Treatment may include oxygen therapy, continuous positive airway pressure (CPAP), tracheostomy, lying on your stomach (prone position), medicines, fluids and nutrients given through an IV tube, compression stockings, and extra corporeal membrane oxygenation (ECMO). °This information is not intended to replace advice given to you by your health care provider. Make sure you discuss any questions you have with your health care provider. °Document Released: 09/02/2005 Document Revised: 08/19/2016 Document Reviewed: 08/19/2016 °[employer] Interactive Patient Education © 2019 [employer] ° °

## 2019-03-10 NOTE — TOC Initial Note (Signed)
Transition of Care [facility]) - Initial/Assessment Note    Patient Details  Name: Caitlyn Evans MRN: [id number] Date of Birth: 08-24-1966  [facility]) CM/SW Contact:    Shelbie Hutching, RN Phone Number: 03/10/2019, 9:44 AM  Clinical Narrative:                 Patient admitted for acute respiratory failure, COPD exacerbation.  Patient has been counseled on smoking cessation.  Patient is well know to [facility] team with frequent admission related to COPD.  Patient lives with her husband and granddaughter.   Patient has had home health in the past and reports that she does not need them at this time.  Patient is on chronic O2 at 2.5 L, she also has a NIV but she has not been using it because it is uncomfortable.  Patient reports she has all needed DME.  No discharge needs identified patient following up with her PCP every month.    Expected Discharge Plan: Home/Self Care Barriers to Discharge: Continued Medical Work up   Patient Goals and CMS Choice Patient states their goals for this hospitalization and ongoing recovery are:: Patient ready to go home      Expected Discharge Plan and Services Expected Discharge Plan: Home/Self Care   Discharge Planning Services: CM Consult   Living arrangements for the past 2 months: Single Family Home                                      Prior Living Arrangements/Services Living arrangements for the past 2 months: Single Family Home Lives with:: Spouse, Relatives(husband and granddaughter)   Do you feel safe going back to the place where you live?: Yes      Need for Family Participation in Patient Care: Yes (Comment)(COPD) Care giver support system in place?: Yes (comment)(husband and granddaughter) Current home services: DME(pt reports she all needed DME, including NIV) Criminal Activity/Legal Involvement Pertinent to Current Situation/Hospitalization: No - Comment as needed  Activities of Daily Living Home Assistive  Devices/Equipment: BIPAP, Eyeglasses, Oxygen, Walker (specify type), Cane (specify quad or straight) ADL Screening (condition at time of admission) Patient's cognitive ability adequate to safely complete daily activities?: Yes Is the patient deaf or have difficulty hearing?: No Does the patient have difficulty seeing, even when wearing glasses/contacts?: No Does the patient have difficulty concentrating, remembering, or making decisions?: No Patient able to express need for assistance with ADLs?: Yes Does the patient have difficulty dressing or bathing?: No Independently performs ADLs?: Yes (appropriate for developmental age) Does the patient have difficulty walking or climbing stairs?: No Weakness of Legs: None Weakness of Arms/Hands: None  Permission Sought/Granted         Permission granted to share info w AGENCY: [facility]        Emotional Assessment Appearance:: Appears older than stated age Attitude/Demeanor/Rapport: Engaged Affect (typically observed): Accepting Orientation: : Oriented to Self, Oriented to Place, Oriented to  Time, Oriented to Situation Alcohol / Substance Use: Tobacco Use Psych Involvement: No (comment)  Admission diagnosis:  Respiratory distress [R06.03] COPD exacerbation ([place]) [J44.1] Patient Active Problem List   Diagnosis Date Noted  . Hip fracture ([place]) 11/16/2018  . Acute on chronic respiratory failure with hypoxia ([place]) 09/09/2018  . Esophagitis, unspecified   . Stomach irritation   . Hiatal hernia   . COPD (chronic obstructive pulmonary disease) ([place]) 07/19/2016  . Acute on chronic  respiratory failure (HCC) 05/09/2016  . Noncompliance with medication regimen 05/09/2016  . COPD with acute exacerbation (HCC) 02/05/2016  . Acute exacerbation of chronic obstructive pulmonary disease (COPD) (HCC)   . Acute respiratory failure (HCC)   . Acute respiratory failure with hypoxia (HCC) 12/05/2015  . 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 Depression, major, single episode, moderate (HCC) 07/14/2015  . Adjustment disorder with anxiety 07/14/2015  . COPD exacerbation (HCC) 07/14/2015  . Special screening for malignant neoplasms, colon   . Swallowing difficulty   . Loss of weight   . Esophagogastric ulcer   . Feline esophagus   . Hypertension 05/30/2015  . Emphysema lung (HCC) 05/30/2015  . Dysphagia, pharyngoesophageal phase 05/30/2015   PCP:  Corky DownsMasoud, Javed, MD Pharmacy:   [facility] [place], [place] - [address] [place] [address] Phone: [phone] Fax: [phone]  [facility] [address] - [place], [place] - [address] [place] [address] Phone: [phone] Fax: [phone]     Social Determinants of Health (SDOH) Interventions    Readmission Risk Interventions No flowsheet data found.

## 2019-03-13 LAB — CULTURE, BLOOD (ROUTINE X 2)
Culture: NO GROWTH
Culture: NO GROWTH
Special Requests: ADEQUATE

## 2019-03-20 DIAGNOSIS — J449 Chronic obstructive pulmonary disease, unspecified: Secondary | ICD-10-CM | POA: Diagnosis not present

## 2019-03-20 DIAGNOSIS — R062 Wheezing: Secondary | ICD-10-CM | POA: Diagnosis not present

## 2019-03-20 DIAGNOSIS — J441 Chronic obstructive pulmonary disease with (acute) exacerbation: Secondary | ICD-10-CM | POA: Diagnosis not present

## 2019-03-20 DIAGNOSIS — J9601 Acute respiratory failure with hypoxia: Secondary | ICD-10-CM | POA: Diagnosis not present

## 2019-03-22 DIAGNOSIS — R062 Wheezing: Secondary | ICD-10-CM | POA: Diagnosis not present

## 2019-03-22 DIAGNOSIS — J449 Chronic obstructive pulmonary disease, unspecified: Secondary | ICD-10-CM | POA: Diagnosis not present

## 2019-03-22 DIAGNOSIS — J9601 Acute respiratory failure with hypoxia: Secondary | ICD-10-CM | POA: Diagnosis not present

## 2019-03-22 DIAGNOSIS — J441 Chronic obstructive pulmonary disease with (acute) exacerbation: Secondary | ICD-10-CM | POA: Diagnosis not present

## 2019-03-24 DIAGNOSIS — J449 Chronic obstructive pulmonary disease, unspecified: Secondary | ICD-10-CM | POA: Diagnosis not present

## 2019-03-24 DIAGNOSIS — J9611 Chronic respiratory failure with hypoxia: Secondary | ICD-10-CM | POA: Diagnosis not present

## 2019-03-29 DIAGNOSIS — K222 Esophageal obstruction: Secondary | ICD-10-CM | POA: Diagnosis not present

## 2019-03-29 DIAGNOSIS — J418 Mixed simple and mucopurulent chronic bronchitis: Secondary | ICD-10-CM | POA: Diagnosis not present

## 2019-03-29 DIAGNOSIS — I43 Cardiomyopathy in diseases classified elsewhere: Secondary | ICD-10-CM | POA: Diagnosis not present

## 2019-03-29 DIAGNOSIS — I119 Hypertensive heart disease without heart failure: Secondary | ICD-10-CM | POA: Diagnosis not present

## 2019-03-30 ENCOUNTER — Encounter: Payer: Self-pay | Admitting: Emergency Medicine

## 2019-03-30 ENCOUNTER — Inpatient Hospital Stay (HOSPITAL_COMMUNITY)
Admit: 2019-03-30 | Discharge: 2019-03-30 | Disposition: A | Discharge status: Home / Self Care | Payer: Medicare Other | Attending: Internal Medicine | Admitting: Internal Medicine

## 2019-03-30 ENCOUNTER — Inpatient Hospital Stay
Admission: EM | Admit: 2019-03-30 | Discharge: 2019-03-31 | DRG: 190 | Disposition: A | Discharge status: Home / Self Care | Payer: Medicare Other | Attending: Internal Medicine | Admitting: Internal Medicine

## 2019-03-30 ENCOUNTER — Other Ambulatory Visit: Payer: Self-pay

## 2019-03-30 ENCOUNTER — Emergency Department: Payer: Medicare Other

## 2019-03-30 DIAGNOSIS — Z20828 Contact with and (suspected) exposure to other viral communicable diseases: Secondary | ICD-10-CM | POA: Diagnosis present

## 2019-03-30 DIAGNOSIS — E785 Hyperlipidemia, unspecified: Secondary | ICD-10-CM | POA: Diagnosis present

## 2019-03-30 DIAGNOSIS — I361 Nonrheumatic tricuspid (valve) insufficiency: Secondary | ICD-10-CM

## 2019-03-30 DIAGNOSIS — Z7951 Long term (current) use of inhaled steroids: Secondary | ICD-10-CM

## 2019-03-30 DIAGNOSIS — I110 Hypertensive heart disease with heart failure: Secondary | ICD-10-CM | POA: Diagnosis present

## 2019-03-30 DIAGNOSIS — Z885 Allergy status to narcotic agent status: Secondary | ICD-10-CM | POA: Diagnosis not present

## 2019-03-30 DIAGNOSIS — F410 Panic disorder [episodic paroxysmal anxiety] without agoraphobia: Secondary | ICD-10-CM | POA: Diagnosis present

## 2019-03-30 DIAGNOSIS — Z90721 Acquired absence of ovaries, unilateral: Secondary | ICD-10-CM

## 2019-03-30 DIAGNOSIS — I340 Nonrheumatic mitral (valve) insufficiency: Secondary | ICD-10-CM | POA: Diagnosis not present

## 2019-03-30 DIAGNOSIS — R069 Unspecified abnormalities of breathing: Secondary | ICD-10-CM | POA: Diagnosis not present

## 2019-03-30 DIAGNOSIS — K449 Diaphragmatic hernia without obstruction or gangrene: Secondary | ICD-10-CM | POA: Diagnosis present

## 2019-03-30 DIAGNOSIS — Z880 Allergy status to penicillin: Secondary | ICD-10-CM

## 2019-03-30 DIAGNOSIS — R0600 Dyspnea, unspecified: Secondary | ICD-10-CM | POA: Diagnosis not present

## 2019-03-30 DIAGNOSIS — R1314 Dysphagia, pharyngoesophageal phase: Secondary | ICD-10-CM | POA: Diagnosis present

## 2019-03-30 DIAGNOSIS — F419 Anxiety disorder, unspecified: Secondary | ICD-10-CM | POA: Diagnosis present

## 2019-03-30 DIAGNOSIS — I252 Old myocardial infarction: Secondary | ICD-10-CM

## 2019-03-30 DIAGNOSIS — Z981 Arthrodesis status: Secondary | ICD-10-CM

## 2019-03-30 DIAGNOSIS — J441 Chronic obstructive pulmonary disease with (acute) exacerbation: Principal | ICD-10-CM | POA: Diagnosis present

## 2019-03-30 DIAGNOSIS — Z8249 Family history of ischemic heart disease and other diseases of the circulatory system: Secondary | ICD-10-CM | POA: Diagnosis not present

## 2019-03-30 DIAGNOSIS — Z825 Family history of asthma and other chronic lower respiratory diseases: Secondary | ICD-10-CM | POA: Diagnosis not present

## 2019-03-30 DIAGNOSIS — J302 Other seasonal allergic rhinitis: Secondary | ICD-10-CM | POA: Diagnosis present

## 2019-03-30 DIAGNOSIS — I5031 Acute diastolic (congestive) heart failure: Secondary | ICD-10-CM | POA: Diagnosis present

## 2019-03-30 DIAGNOSIS — F17210 Nicotine dependence, cigarettes, uncomplicated: Secondary | ICD-10-CM | POA: Diagnosis present

## 2019-03-30 DIAGNOSIS — Z79899 Other long term (current) drug therapy: Secondary | ICD-10-CM

## 2019-03-30 DIAGNOSIS — J9620 Acute and chronic respiratory failure, unspecified whether with hypoxia or hypercapnia: Secondary | ICD-10-CM | POA: Diagnosis present

## 2019-03-30 DIAGNOSIS — Z90710 Acquired absence of both cervix and uterus: Secondary | ICD-10-CM | POA: Diagnosis not present

## 2019-03-30 DIAGNOSIS — Z716 Tobacco abuse counseling: Secondary | ICD-10-CM

## 2019-03-30 DIAGNOSIS — R0689 Other abnormalities of breathing: Secondary | ICD-10-CM | POA: Diagnosis not present

## 2019-03-30 DIAGNOSIS — J9621 Acute and chronic respiratory failure with hypoxia: Secondary | ICD-10-CM | POA: Diagnosis not present

## 2019-03-30 DIAGNOSIS — R0602 Shortness of breath: Secondary | ICD-10-CM | POA: Diagnosis not present

## 2019-03-30 DIAGNOSIS — I10 Essential (primary) hypertension: Secondary | ICD-10-CM | POA: Diagnosis not present

## 2019-03-30 DIAGNOSIS — K219 Gastro-esophageal reflux disease without esophagitis: Secondary | ICD-10-CM | POA: Diagnosis present

## 2019-03-30 LAB — BASIC METABOLIC PANEL
Anion gap: 7 (ref 5–15)
BUN: 26 mg/dL — ABNORMAL HIGH (ref 6–20)
CO2: 30 mmol/L (ref 22–32)
Calcium: 8.4 mg/dL — ABNORMAL LOW (ref 8.9–10.3)
Chloride: 105 mmol/L (ref 98–111)
Creatinine, Ser: 0.41 mg/dL — ABNORMAL LOW (ref 0.44–1.00)
GFR calc Af Amer: >60 mL/min (ref >60)
GFR calc non Af Amer: >60 mL/min (ref >60)
Glucose, Bld: 114 mg/dL — ABNORMAL HIGH (ref 70–99)
Potassium: 3.5 mmol/L (ref 3.5–5.1)
Sodium: 142 mmol/L (ref 135–145)

## 2019-03-30 LAB — BLOOD GAS, VENOUS
Acid-Base Excess: 4.7 mmol/L — ABNORMAL HIGH (ref 0.0–2.0)
Bicarbonate: 32 mmol/L — ABNORMAL HIGH (ref 20.0–28.0)
Delivery systems: POSITIVE
FIO2: 0.3
O2 Saturation: 68.9 %
Patient temperature: 37
pCO2, Ven: 58 mmHg (ref 44.0–60.0)
pH, Ven: 7.35 (ref 7.250–7.430)
pO2, Ven: 38 mmHg (ref 32.0–45.0)

## 2019-03-30 LAB — CBC WITH DIFFERENTIAL/PLATELET
Abs Immature Granulocytes: 0.09 10*3/uL — ABNORMAL HIGH (ref 0.00–0.07)
Basophils Absolute: 0 10*3/uL (ref 0.0–0.1)
Basophils Relative: 0 %
Eosinophils Absolute: 0 10*3/uL (ref 0.0–0.5)
Eosinophils Relative: 0 %
HCT: 42.9 % (ref 36.0–46.0)
Hemoglobin: 12.7 g/dL (ref 12.0–15.0)
Immature Granulocytes: 1 %
Lymphocytes Relative: 5 %
Lymphs Abs: 0.6 10*3/uL — ABNORMAL LOW (ref 0.7–4.0)
MCH: 26.2 pg (ref 26.0–34.0)
MCHC: 29.6 g/dL — ABNORMAL LOW (ref 30.0–36.0)
MCV: 88.6 fL (ref 80.0–100.0)
Monocytes Absolute: 0.7 10*3/uL (ref 0.1–1.0)
Monocytes Relative: 7 %
Neutro Abs: 9.3 10*3/uL — ABNORMAL HIGH (ref 1.7–7.7)
Neutrophils Relative %: 87 %
Platelets: 302 10*3/uL (ref 150–400)
RBC: 4.84 MIL/uL (ref 3.87–5.11)
RDW: 17.2 % — ABNORMAL HIGH (ref 11.5–15.5)
WBC: 10.8 10*3/uL — ABNORMAL HIGH (ref 4.0–10.5)
nRBC: 0 % (ref 0.0–0.2)

## 2019-03-30 LAB — TROPONIN I (HIGH SENSITIVITY): Troponin I (High Sensitivity): 12 ng/L (ref <18)

## 2019-03-30 LAB — BRAIN NATRIURETIC PEPTIDE: B Natriuretic Peptide: 49 pg/mL (ref 0.0–100.0)

## 2019-03-30 LAB — GLUCOSE, CAPILLARY: Glucose-Capillary: 115 mg/dL — ABNORMAL HIGH (ref 70–99)

## 2019-03-30 LAB — SARS CORONAVIRUS 2 BY RT PCR (HOSPITAL ORDER, PERFORMED IN ~~LOC~~ HOSPITAL LAB): SARS Coronavirus 2: NEGATIVE

## 2019-03-30 MED ORDER — VITAMIN C 500 MG PO TABS
1000.0000 mg | ORAL_TABLET | Freq: Every day | ORAL | Status: DC
  Administered 2019-03-30 – 2019-03-31 (×2): 1000 mg via ORAL
  Filled 2019-03-30 (×2): qty 2

## 2019-03-30 MED ORDER — NEBIVOLOL HCL 10 MG PO TABS
10.0000 mg | ORAL_TABLET | Freq: Every day | ORAL | Status: DC
  Filled 2019-03-30 (×2): qty 1

## 2019-03-30 MED ORDER — IPRATROPIUM-ALBUTEROL 0.5-2.5 (3) MG/3ML IN SOLN
3.0000 mL | Freq: Once | RESPIRATORY_TRACT | Status: AC
  Administered 2019-03-30: 3 mL via RESPIRATORY_TRACT
  Filled 2019-03-30: qty 3

## 2019-03-30 MED ORDER — DILTIAZEM HCL ER COATED BEADS 120 MG PO CP24
120.0000 mg | ORAL_CAPSULE | Freq: Every day | ORAL | Status: DC
  Administered 2019-03-30: 120 mg via ORAL
  Filled 2019-03-30: qty 1

## 2019-03-30 MED ORDER — NICOTINE 21 MG/24HR TD PT24
21.0000 mg | MEDICATED_PATCH | Freq: Every day | TRANSDERMAL | Status: DC
  Administered 2019-03-30 – 2019-03-31 (×2): 21 mg via TRANSDERMAL
  Filled 2019-03-30 (×2): qty 1

## 2019-03-30 MED ORDER — DONEPEZIL HCL 5 MG PO TABS
10.0000 mg | ORAL_TABLET | Freq: Every day | ORAL | Status: DC
  Administered 2019-03-30 – 2019-03-31 (×2): 10 mg via ORAL
  Filled 2019-03-30 (×2): qty 2

## 2019-03-30 MED ORDER — SODIUM CHLORIDE 0.9 % IV SOLN
500.0000 mg | INTRAVENOUS | Status: DC
  Administered 2019-03-31: 500 mg via INTRAVENOUS
  Filled 2019-03-30: qty 500

## 2019-03-30 MED ORDER — PREGABALIN 50 MG PO CAPS
100.0000 mg | ORAL_CAPSULE | Freq: Two times a day (BID) | ORAL | Status: DC
  Administered 2019-03-30 – 2019-03-31 (×2): 100 mg via ORAL
  Filled 2019-03-30 (×2): qty 2

## 2019-03-30 MED ORDER — LORAZEPAM 2 MG/ML IJ SOLN
0.5000 mg | Freq: Once | INTRAMUSCULAR | Status: DC
  Filled 2019-03-30: qty 1

## 2019-03-30 MED ORDER — LEVOFLOXACIN 500 MG PO TABS: 500.0000 mg | ORAL_TABLET | Freq: Every day | ORAL | Status: DC

## 2019-03-30 MED ORDER — METHYLPREDNISOLONE SODIUM SUCC 125 MG IJ SOLR
INTRAMUSCULAR | Status: AC
  Filled 2019-03-30: qty 2

## 2019-03-30 MED ORDER — METHYLPREDNISOLONE SODIUM SUCC 40 MG IJ SOLR
40.0000 mg | Freq: Two times a day (BID) | INTRAMUSCULAR | Status: DC
  Administered 2019-03-30 – 2019-03-31 (×2): 40 mg via INTRAVENOUS
  Filled 2019-03-30 (×2): qty 1

## 2019-03-30 MED ORDER — ACETAMINOPHEN ER 650 MG PO TBCR: 650.0000 mg | EXTENDED_RELEASE_TABLET | ORAL | Status: DC | PRN

## 2019-03-30 MED ORDER — IPRATROPIUM-ALBUTEROL 0.5-2.5 (3) MG/3ML IN SOLN
3.0000 mL | RESPIRATORY_TRACT | Status: DC | PRN
  Administered 2019-03-30 – 2019-03-31 (×3): 3 mL via RESPIRATORY_TRACT
  Filled 2019-03-30 (×3): qty 3

## 2019-03-30 MED ORDER — ALPRAZOLAM 0.5 MG PO TABS
0.5000 mg | ORAL_TABLET | Freq: Three times a day (TID) | ORAL | Status: DC
  Administered 2019-03-30 – 2019-03-31 (×3): 0.5 mg via ORAL
  Filled 2019-03-30 (×3): qty 1

## 2019-03-30 MED ORDER — DULOXETINE HCL 30 MG PO CPEP
60.0000 mg | ORAL_CAPSULE | Freq: Every day | ORAL | Status: DC
  Administered 2019-03-30 – 2019-03-31 (×2): 60 mg via ORAL
  Filled 2019-03-30 (×2): qty 2

## 2019-03-30 MED ORDER — METHYLPREDNISOLONE SODIUM SUCC 125 MG IJ SOLR
125.0000 mg | Freq: Once | INTRAMUSCULAR | Status: AC
  Administered 2019-03-30: 125 mg via INTRAVENOUS
  Filled 2019-03-30: qty 2

## 2019-03-30 MED ORDER — SODIUM CHLORIDE 0.9 % IV SOLN
1.0000 g | INTRAVENOUS | Status: DC
  Administered 2019-03-31: 1 g via INTRAVENOUS
  Filled 2019-03-30: qty 1

## 2019-03-30 MED ORDER — ACETAMINOPHEN 325 MG PO TABS
650.0000 mg | ORAL_TABLET | ORAL | Status: DC | PRN
  Administered 2019-03-30 – 2019-03-31 (×2): 650 mg via ORAL
  Filled 2019-03-30 (×2): qty 2

## 2019-03-30 MED ORDER — DILTIAZEM HCL ER COATED BEADS 240 MG PO CP24
240.0000 mg | ORAL_CAPSULE | Freq: Every day | ORAL | Status: DC
  Administered 2019-03-31: 240 mg via ORAL
  Filled 2019-03-30: qty 1

## 2019-03-30 MED ORDER — LEVOFLOXACIN IN D5W 750 MG/150ML IV SOLN
750.0000 mg | Freq: Once | INTRAVENOUS | Status: AC
  Administered 2019-03-30: 750 mg via INTRAVENOUS
  Filled 2019-03-30: qty 150

## 2019-03-30 MED ORDER — ROSUVASTATIN CALCIUM 10 MG PO TABS
10.0000 mg | ORAL_TABLET | Freq: Every day | ORAL | Status: DC
  Administered 2019-03-30 – 2019-03-31 (×2): 10 mg via ORAL
  Filled 2019-03-30 (×2): qty 1

## 2019-03-30 MED ORDER — POTASSIUM CHLORIDE 20 MEQ PO PACK
40.0000 meq | PACK | Freq: Once | ORAL | Status: AC
  Administered 2019-03-30: 40 meq via ORAL
  Filled 2019-03-30: qty 2

## 2019-03-30 MED ORDER — DILTIAZEM HCL ER COATED BEADS 120 MG PO CP24
120.0000 mg | ORAL_CAPSULE | Freq: Once | ORAL | Status: AC
  Administered 2019-03-30: 16:00:00 120 mg via ORAL
  Filled 2019-03-30: qty 1

## 2019-03-30 MED ORDER — DOCUSATE SODIUM 100 MG PO CAPS: 100.0000 mg | ORAL_CAPSULE | Freq: Two times a day (BID) | ORAL | Status: DC | PRN

## 2019-03-30 MED ORDER — HEPARIN SODIUM (PORCINE) 5000 UNIT/ML IJ SOLN
5000.0000 [IU] | Freq: Three times a day (TID) | INTRAMUSCULAR | Status: DC
  Filled 2019-03-30: qty 1

## 2019-03-30 MED ORDER — METHYLPREDNISOLONE SODIUM SUCC 125 MG IJ SOLR
60.0000 mg | Freq: Four times a day (QID) | INTRAMUSCULAR | Status: DC
  Administered 2019-03-30: 60 mg via INTRAVENOUS
  Filled 2019-03-30: qty 2

## 2019-03-30 MED ORDER — LORAZEPAM 2 MG/ML IJ SOLN
1.0000 mg | Freq: Once | INTRAMUSCULAR | Status: AC
  Administered 2019-03-30: 1 mg via INTRAVENOUS

## 2019-03-30 MED ORDER — BUDESONIDE 0.25 MG/2ML IN SUSP
0.2500 mg | Freq: Two times a day (BID) | RESPIRATORY_TRACT | Status: DC
  Administered 2019-03-30 – 2019-03-31 (×2): 0.25 mg via RESPIRATORY_TRACT
  Filled 2019-03-30 (×2): qty 2

## 2019-03-30 MED ORDER — NALOXONE HCL 4 MG/0.1ML NA LIQD
1.0000 | Freq: Once | NASAL | Status: DC
  Filled 2019-03-30: qty 8

## 2019-03-30 NOTE — Progress Notes (Signed)
Spoke with RN Mae Denunzio and asked if patient still needed IV access. RN stated at this time the patient is okay with 1 access if more are needed then she'd place another PIV consult

## 2019-03-30 NOTE — Progress Notes (Signed)
Family Meeting Note  Advance Directive:yes  Today a meeting took place with the Patient.   The following clinical team members were present during this meeting:MD  The following were discussed:Patient's diagnosis: Acute on chronic respiratory failure and COPD exacerbation, Patient's progosis: Unable to determine and Goals for treatment: Full Code  Additional follow-up to be provided: Intensivist  Time spent during discussion:20 minutes  Vaughan Basta, MD

## 2019-03-30 NOTE — ED Notes (Signed)
ED TO INPATIENT HANDOFF REPORT  ED Nurse Name and Phone #: Lydia Guiles, [phone]  S Name/Age/Gender Caitlyn Evans 53 y.o. female Room/Bed: ED11A/ED11A  Code Status   Code Status: Prior  Home/SNF/Other Home Patient oriented to: self, place, time and situation Is this baseline? Yes   Triage Complete: Triage complete  Chief Complaint sob  Triage Note Patient to ER from home via [facility] for c/o respiratory distress with h/o COPD. Patient has recent hospitalization for the same. Patient was placed on CPAP by EMS and sats were 100% upon arrival on CPAP. Patient was able to speak while being switched over to Bipap, but visibly short of breath. Patient denies any recent fevers.     Allergies Allergies  Allergen Reactions  . Codeine Hives, Nausea And Vomiting and Nausea Only  . Penicillins Hives and Other (See Comments)    Has patient had a PCN reaction causing immediate rash, facial/tongue/throat swelling, SOB or lightheadedness with hypotension: No Has patient had a PCN reaction causing severe rash involving mucus membranes or skin necrosis: No Has patient had a PCN reaction that required hospitalization No Has patient had a PCN reaction occurring within the last 10 years: No If all of the above answers are "NO", then may proceed with Cephalosporin use. Other reaction(s): Other (See Comments) Has patient had a PCN    Level of Care/Admitting Diagnosis ED Disposition    ED Disposition Condition Comment   Admit  Hospital [facility] [[id number]]  Level of Care: Stepdown [14]  Covid Evaluation: Confirmed COVID Negative  Diagnosis: COPD exacerbation [facility]) [[id number]]  Admitting Physician: Altamese Dilling [phone]  Attending Physician: Altamese Dilling [phone]  Estimated length of stay: past midnight tomorrow  Certification:: I certify this patient will need inpatient services for at least 2 midnights  PT Class (Do Not Modify): Inpatient [101]  PT Acc  Code (Do Not Modify): Private [1]       B Medical/Surgery History Past Medical History:  Diagnosis Date  . Anxiety    panic attacks  . Asthma   . BOOP (bronchiolitis obliterans with organizing pneumonia) (HCC)   . CHF (congestive heart failure) (HCC)   . Dysphagia, pharyngoesophageal phase 05/30/2015  . Emphysema lung (HCC) 05/30/2015   on steroids and nebulizer for copd issues.  09/15/18  . GERD (gastroesophageal reflux disease)   . Hypercholesterolemia   . Hypertension   . Lung mass   . Migraines   . Motion sickness    all moving vehicles  . Myocardial infarction (HCC) 2012  . Respiratory failure with hypercapnia (HCC) 08/2018   was hospitalized and on bipap. steroids and nebulizer treatments helped  . Seasonal allergies   . Shortness of breath dyspnea    1 flight-stairs  . Sternal fracture    Past Surgical History:  Procedure Laterality Date  . BACK SURGERY  10/2018   spinal fusion at [facility]  . COLONOSCOPY WITH PROPOFOL N/A 06/02/2015   Procedure: COLONOSCOPY WITH PROPOFOL;  Surgeon: Midge Minium, MD;  Location[facility];  Service: Endoscopy;  Laterality: N/A;  . ESOPHAGOGASTRODUODENOSCOPY (EGD) WITH PROPOFOL N/A 06/02/2015   Procedure: ESOPHAGOGASTRODUODENOSCOPY (EGD) WITH PROPOFOL withdialation;  Surgeon: Midge Minium, MD;  Location[facility];  Service: Endoscopy;  Laterality: N/A;  . ESOPHAGOGASTRODUODENOSCOPY (EGD) WITH PROPOFOL N/A 11/05/2017   Procedure: ESOPHAGOGASTRODUODENOSCOPY (EGD) WITH PROPOFOL;  Surgeon: Pasty Spillers, MD;  Location: [facility];  Service: Endoscopy;  Laterality: N/A;  . HIP PINNING,CANNULATED Right 11/17/2018   Procedure: CANNULATED HIP  PINNING;  Surgeon: Leim Fabry, MD;  Location: [facility] ORS;  Service: Orthopedics;  Laterality: Right;  . KYPHOPLASTY N/A 09/17/2018   Procedure: KYPHOPLASTY T6;  Surgeon: Hessie Knows, MD;  Location: [facility] ORS;  Service: Orthopedics;  Laterality: N/A;  . LUNG SURGERY Left    Upper lobe  removed  . OOPHORECTOMY Left   . VAGINAL HYSTERECTOMY       A IV Location/Drains/Wounds Patient Lines/Drains/Airways Status   Active Line/Drains/Airways    Name:   Placement date:   Placement time:   Site:   Days:   Peripheral IV 03/30/19 Left Wrist   03/30/19    0806    Wrist   less than 1   Incision (Closed) 06/02/15 Lip Other (Comment)   06/02/15    1036     1397   Incision (Closed) 06/02/15 Rectum Other (Comment)   06/02/15    1036     1397   Incision (Closed) 09/17/18 Back Other (Comment)   09/17/18    0909     194   Incision (Closed) 11/17/18 Hip Right   11/17/18    1450     133          Intake/Output Last 24 hours No intake or output data in the 24 hours ending 03/30/19 1018  Labs/Imaging Results for orders placed or performed during the hospital encounter of 03/30/19 (from the past 48 hour(s))  CBC with Differential     Status: Abnormal   Collection Time: 03/30/19  8:09 AM  Result Value Ref Range   WBC 10.8 (H) 4.0 - 10.5 K/uL   RBC 4.84 3.87 - 5.11 MIL/uL   Hemoglobin 12.7 12.0 - 15.0 g/dL   HCT 42.9 36.0 - 46.0 %   MCV 88.6 80.0 - 100.0 fL   MCH 26.2 26.0 - 34.0 pg   MCHC 29.6 (L) 30.0 - 36.0 g/dL   RDW 17.2 (H) 11.5 - 15.5 %   Platelets 302 150 - 400 K/uL   nRBC 0.0 0.0 - 0.2 %   Neutrophils Relative % 87 %   Neutro Abs 9.3 (H) 1.7 - 7.7 K/uL   Lymphocytes Relative 5 %   Lymphs Abs 0.6 (L) 0.7 - 4.0 K/uL   Monocytes Relative 7 %   Monocytes Absolute 0.7 0.1 - 1.0 K/uL   Eosinophils Relative 0 %   Eosinophils Absolute 0.0 0.0 - 0.5 K/uL   Basophils Relative 0 %   Basophils Absolute 0.0 0.0 - 0.1 K/uL   Immature Granulocytes 1 %   Abs Immature Granulocytes 0.09 (H) 0.00 - 0.07 K/uL    Comment: Performed at [facility], [address] [place], [place] [address]  Basic metabolic panel     Status: Abnormal   Collection Time: 03/30/19  8:09 AM  Result Value Ref Range   Sodium 142 135 - 145 mmol/L   Potassium 3.5 3.5 - 5.1 mmol/L   Chloride 105  98 - 111 mmol/L   CO2 30 22 - 32 mmol/L   Glucose, Bld 114 (H) 70 - 99 mg/dL   BUN 26 (H) 6 - 20 mg/dL   Creatinine, Ser 0.41 (L) 0.44 - 1.00 mg/dL   Calcium 8.4 (L) 8.9 - 10.3 mg/dL   GFR calc non Af Amer >60 >60 mL/min   GFR calc Af Amer >60 >60 mL/min   Anion gap 7 5 - 15    Comment: Performed at [facility], [address] [place], [place] [address]  Brain natriuretic peptide  Status: None   Collection Time: 03/30/19  8:09 AM  Result Value Ref Range   B Natriuretic Peptide 49.0 0.0 - 100.0 pg/mL    Comment: Performed at [facility], [address] [place], [place] [address]  Troponin I (High Sensitivity)     Status: None   Collection Time: 03/30/19  8:09 AM  Result Value Ref Range   Troponin I (High Sensitivity) 12 <18 ng/L    Comment: (NOTE) Elevated high sensitivity troponin I (hsTnI) values and significant  changes across serial measurements may suggest ACS but many other  chronic and acute conditions are known to elevate hsTnI results.  Refer to the "Links" section for chest pain algorithms and additional  guidance. Performed at [facility], [address] [place], [place] [address]   Blood gas, venous     Status: Abnormal   Collection Time: 03/30/19  8:09 AM  Result Value Ref Range   FIO2 0.30    Delivery systems BILEVEL POSITIVE AIRWAY PRESSURE    pH, Ven 7.35 7.250 - 7.430   pCO2, Ven 58 44.0 - 60.0 mmHg   pO2, Ven 38.0 32.0 - 45.0 mmHg   Bicarbonate 32.0 (H) 20.0 - 28.0 mmol/L   Acid-Base Excess 4.7 (H) 0.0 - 2.0 mmol/L   O2 Saturation 68.9 %   Patient temperature 37.0    Collection site VEIN    Sample type VENIPUNCTURE     Comment: Performed at [facility], [address] [place], [place] [address]  SARS Coronavirus 2 (CEPHEID- Performed in [facility] hospital lab), Hosp Order     Status: None   Collection Time: 03/30/19  8:09 AM   Specimen: Nasopharyngeal Swab  Result Value Ref Range   SARS Coronavirus 2  NEGATIVE NEGATIVE    Comment: (NOTE) If result is NEGATIVE SARS-CoV-2 target nucleic acids are NOT DETECTED. The SARS-CoV-2 RNA is generally detectable in upper and lower  respiratory specimens during the acute phase of infection. The lowest  concentration of SARS-CoV-2 viral copies this assay can detect is 250  copies / mL. A negative result does not preclude SARS-CoV-2 infection  and should not be used as the sole basis for treatment or other  patient management decisions.  A negative result may occur with  improper specimen collection / handling, submission of specimen other  than nasopharyngeal swab, presence of viral mutation(s) within the  areas targeted by this assay, and inadequate number of viral copies  (<250 copies / mL). A negative result must be combined with clinical  observations, patient history, and epidemiological information. If result is POSITIVE SARS-CoV-2 target nucleic acids are DETECTED. The SARS-CoV-2 RNA is generally detectable in upper and lower  respiratory specimens dur ing the acute phase of infection.  Positive  results are indicative of active infection with SARS-CoV-2.  Clinical  correlation with patient history and other diagnostic information is  necessary to determine patient infection status.  Positive results do  not rule out bacterial infection or co-infection with other viruses. If result is PRESUMPTIVE POSTIVE SARS-CoV-2 nucleic acids MAY BE PRESENT.   A presumptive positive result was obtained on the submitted specimen  and confirmed on repeat testing.  While 2019 novel coronavirus  (SARS-CoV-2) nucleic acids may be present in the submitted sample  additional confirmatory testing may be necessary for epidemiological  and / or clinical management purposes  to differentiate between  SARS-CoV-2 and other Sarbecovirus currently known to infect humans.  If clinically indicated additional testing with an  alternate test  methodology [phone] is  advised. The SARS-CoV-2 RNA is generally  detectable in upper and lower respiratory sp ecimens during the acute  phase of infection. The expected result is Negative. Fact Sheet for Patients:  BoilerBrush.com.cyhttps://www.fda.gov/media/136312/download Fact Sheet for Healthcare Providers: https://pope.com/https://www.fda.gov/media/136313/download This test is not yet approved or cleared by the [place] FDA and has been authorized for detection and/or diagnosis of SARS-CoV-2 by FDA under an Emergency Use Authorization (EUA).  This EUA will remain in effect (meaning this test can be used) for the duration of the COVID-19 declaration under Section 564(b)(1) of the Act, 21 U.S.C. section 360bbb-3(b)(1), unless the authorization is terminated or revoked sooner. Performed at [facility], [address] [place], [place] [address]    Dg Chest 1 View  Result Date: 03/30/2019 CLINICAL DATA:  53 year old with respiratory distress.  Dyspnea. EXAM: CHEST  1 VIEW COMPARISON:  03/08/2019 FINDINGS: Again noted are postsurgical changes in the left upper lung region. Prominent scarring at the right lung apex is stable. Coarse lung markings suggestive for chronic changes but no focal airspace disease or frank pulmonary edema. Again noted is surgical spinal hardware in the upper thoracic spine and evidence of previous vertebral body augmentation procedures. IMPRESSION: Chronic lung changes without acute findings. Electronically Signed   By: Richarda OverlieAdam  Henn M.D.   On: 03/30/2019 08:26    Pending Labs [employer] Labs (From admission, onward)    Start     Ordered   Signed and Armed forces training and education officerHeld  Basic metabolic panel  Tomorrow morning,   R     Signed and Held   Signed and Held  CBC  Tomorrow morning,   R     Signed and Held   Signed and Held  CBC  (heparin)  Once,   R    Comments: Baseline for heparin therapy IF NOT ALREADY DRAWN.  Notify MD if PLT < 100 K.    Signed and Held   Signed and Held  Creatinine, serum  (heparin)  Once,   R     Comments: Baseline for heparin therapy IF NOT ALREADY DRAWN.    Signed and Held          Vitals/Pain Today's Vitals   03/30/19 0900 03/30/19 0930 03/30/19 0941 03/30/19 1000  BP: 113/67 113/69  123/71  Pulse: 63 69  79  Resp: (!) 22 20  (!) 22  Temp:      TempSrc:      SpO2: 100% 100%  100%  Weight:      Height:      PainSc:   0-No pain     Isolation Precautions Airborne and Contact precautions  Medications Medications  levofloxacin (LEVAQUIN) IVPB 750 mg (750 mg Intravenous New Bag/Given 03/30/19 0938)  budesonide (PULMICORT) nebulizer solution 0.25 mg (0.25 mg Nebulization Not Given 03/30/19 1006)  methylPREDNISolone sodium succinate (SOLU-MEDROL) 125 mg/2 mL injection 60 mg (has no administration in time range)  levofloxacin (LEVAQUIN) tablet 500 mg (has no administration in time range)  methylPREDNISolone sodium succinate (SOLU-MEDROL) 125 mg/2 mL injection 125 mg (125 mg Intravenous Given 03/30/19 0811)  LORazepam (ATIVAN) injection 1 mg (1 mg Intravenous Given 03/30/19 0808)  ipratropium-albuterol (DUONEB) 0.5-2.5 (3) MG/3ML nebulizer solution 3 mL (3 mLs Nebulization Given 03/30/19 1006)  ipratropium-albuterol (DUONEB) 0.5-2.5 (3) MG/3ML nebulizer solution 3 mL (3 mLs Nebulization Given 03/30/19 1006)    Mobility walks Low fall risk   Focused Assessments Pulmonary Assessment Handoff:  Lung sounds: Bilateral Breath Sounds: Diminished, Expiratory wheezes O2  Device: Bi-PAP        R Recommendations: See Admitting Provider Note  Report given to:   Additional Notes:  Patient does become anxious easily on Bipap.

## 2019-03-30 NOTE — ED Provider Notes (Signed)
[facility] Emergency Department Provider Note       Time seen: ----------------------------------------- 7:52 AM on 03/30/2019 ----------------------------------------- Level V caveat: History/ROS limited by respiratory distress  I have reviewed the triage vital signs and the nursing notes.  HISTORY   Chief Complaint No chief complaint on file.    HPI Caitlyn Evans is a 53 y.o. female with a history of anxiety, asthma, CHF, GERD, hyperlipidemia, hypertension who presents to the ED for respiratory distress.  Patient brought in on CPAP by EMS for respiratory distress.  Recent hospitalization for same.  Reportedly COVID test was negative.  No further information available at this time  Past Medical History:  Diagnosis Date  . Anxiety    panic attacks  . Asthma   . BOOP (bronchiolitis obliterans with organizing pneumonia) (HCC)   . CHF (congestive heart failure) (HCC)   . Dysphagia, pharyngoesophageal phase 05/30/2015  . Emphysema lung (HCC) 05/30/2015   on steroids and nebulizer for copd issues.  09/15/18  . GERD (gastroesophageal reflux disease)   . Hypercholesterolemia   . Hypertension   . Lung mass   . Migraines   . Motion sickness    all moving vehicles  . Myocardial infarction (HCC) 2012  . Respiratory failure with hypercapnia (HCC) 08/2018   was hospitalized and on bipap. steroids and nebulizer treatments helped  . Seasonal allergies   . Shortness of breath dyspnea    1 flight-stairs  . Sternal fracture     Patient Active Problem List   Diagnosis Date Noted  . Hip fracture (HCC) 11/16/2018  . Acute on chronic respiratory failure with hypoxia (HCC) 09/09/2018  . Esophagitis, unspecified   . Stomach irritation   . Hiatal hernia   . COPD (chronic obstructive pulmonary disease) (HCC) 07/19/2016  . Acute on chronic respiratory failure (HCC) 05/09/2016  . Noncompliance with medication regimen 05/09/2016  . COPD with acute exacerbation  (HCC) 02/05/2016  . Acute exacerbation of chronic obstructive pulmonary disease (COPD) (HCC)   . Acute respiratory failure (HCC)   . Acute respiratory failure with hypoxia (HCC) 12/05/2015  . BOOP (bronchiolitis obliterans with organizing pneumonia) (HCC) 12/05/2015  . Depression, major, single episode, moderate (HCC) 07/14/2015  . Adjustment disorder with anxiety 07/14/2015  . COPD exacerbation (HCC) 07/14/2015  . Special screening for malignant neoplasms, colon   . Swallowing difficulty   . Loss of weight   . Esophagogastric ulcer   . Feline esophagus   . Hypertension 05/30/2015  . Emphysema lung (HCC) 05/30/2015  . Dysphagia, pharyngoesophageal phase 05/30/2015    Past Surgical History:  Procedure Laterality Date  . BACK SURGERY  10/2018   [facility] fusion at [facility]  . COLONOSCOPY WITH PROPOFOL N/A 06/02/2015   Procedure: COLONOSCOPY WITH PROPOFOL;  Surgeon: Midge Miniumarren Wohl, MD;  [facility];  Service: Endoscopy;  Laterality: N/A;  . ESOPHAGOGASTRODUODENOSCOPY (EGD) WITH PROPOFOL N/A 06/02/2015   Procedure: ESOPHAGOGASTRODUODENOSCOPY (EGD) WITH PROPOFOL withdialation;  Surgeon: Midge Miniumarren Wohl, MD;  [facility];  Service: Endoscopy;  Laterality: N/A;  . ESOPHAGOGASTRODUODENOSCOPY (EGD) WITH PROPOFOL N/A 11/05/2017   Procedure: ESOPHAGOGASTRODUODENOSCOPY (EGD) WITH PROPOFOL;  Surgeon: Pasty Spillersahiliani, Varnita B, MD;  Location: [facility];  Service: Endoscopy;  Laterality: N/A;  . HIP PINNING,CANNULATED Right 11/17/2018   Procedure: CANNULATED HIP PINNING;  Surgeon: Signa KellPatel, Sunny, MD;  Location: [facility];  Service: Orthopedics;  Laterality: Right;  . KYPHOPLASTY N/A 09/17/2018   Procedure: KYPHOPLASTY T6;  Surgeon: Kennedy BuckerMenz, Michael, MD;  Location: [facility];  Service:  Orthopedics;  Laterality: N/A;  . LUNG SURGERY Left    Upper lobe removed  . OOPHORECTOMY Left   . VAGINAL HYSTERECTOMY      Allergies Codeine and Penicillins  Social History Social History    Tobacco Use  . Smoking status: Current Some Day Smoker    Packs/day: 1.00    Years: 30.00    Pack years: 30.00    Types: Cigarettes  . Smokeless tobacco: Never Used  . Tobacco comment: quit some time in 2012  Substance Use Topics  . Alcohol use: No    Alcohol/week: 0.0 standard drinks    Comment: rare consumption  . Drug use: Yes    Types: Marijuana    Review of Systems Unknown at this time, positive for respiratory distress  All systems negative/normal/unremarkable except as stated in the HPI  ____________________________________________   PHYSICAL EXAM:  VITAL SIGNS: ED Triage Vitals  Enc Vitals Group     BP      Pulse      Resp      Temp      Temp src      SpO2      Weight      Height      Head Circumference      Peak Flow      Pain Score      Pain Loc      Pain Edu?      Excl. in GC?     Constitutional: Alert, mild to moderate distress Eyes: Conjunctivae are normal. Normal extraocular movements. ENT      Head: Normocephalic and atraumatic.      Nose: No congestion/rhinnorhea.      Mouth/Throat: Mucous membranes are moist.      Neck: No stridor. Cardiovascular: Normal rate, regular rhythm. No murmurs, rubs, or gallops. Respiratory: Tachypnea with wheezing bilaterally, diminished breath sounds Gastrointestinal: Soft and nontender. Normal bowel sounds Musculoskeletal: Nontender with normal range of motion in extremities. No lower extremity tenderness nor edema. Neurologic:  No gross focal neurologic deficits are appreciated.  Skin:  Skin is warm, dry and intact. No rash noted. Psychiatric: Mood and affect are normal.  ____________________________________________  EKG: Interpreted by me.  Sinus rhythm with rate of 73 bpm, normal PR interval, normal QRS, normal QT, normal axis  ____________________________________________  ED COURSE:  As part of my medical decision making, I reviewed the following data within the electronic MEDICAL RECORD NUMBER  History obtained from family if available, nursing notes, old chart and ekg, as well as notes from prior ED visits. Patient presented for acute respiratory distress, we will assess with labs and imaging as indicated at this time.   Procedures  Caitlyn Evans was evaluated in Emergency Department on 03/30/2019 for the symptoms described in the history of present illness. She was evaluated in the context of the global COVID-19 pandemic, which necessitated consideration that the patient might be at risk for infection with the SARS-CoV-2 virus that causes COVID-19. Institutional protocols and algorithms that pertain to the evaluation of patients at risk for COVID-19 are in a state of rapid change based on information released by regulatory bodies including the CDC and federal and state organizations. These policies and algorithms were followed during the patient's care in the ED.  ____________________________________________   LABS (pertinent positives/negatives)  Labs Reviewed  CBC WITH DIFFERENTIAL/PLATELET - Abnormal; Notable for the following components:      Result Value   WBC 10.8 (*)    MCHC 29.6 (*)  RDW 17.2 (*)    Neutro Abs 9.3 (*)    Lymphs Abs 0.6 (*)    Abs Immature Granulocytes 0.09 (*)    All other components within normal limits  BASIC METABOLIC PANEL - Abnormal; Notable for the following components:   Glucose, Bld 114 (*)    BUN 26 (*)    Creatinine, Ser 0.41 (*)    Calcium 8.4 (*)    All other components within normal limits  BLOOD GAS, VENOUS - Abnormal; Notable for the following components:   Bicarbonate 32.0 (*)    Acid-Base Excess 4.7 (*)    All other components within normal limits  SARS CORONAVIRUS 2 (HOSPITAL ORDER, [facility])  BRAIN NATRIURETIC PEPTIDE  TROPONIN I (HIGH SENSITIVITY)   CRITICAL CARE Performed by: Laurence Aly   Total critical care time: 30 minutes  Critical care time was exclusive of separately  billable procedures and treating other patients.  Critical care was necessary to treat or prevent imminent or life-threatening deterioration.  Critical care was time spent personally by me on the following activities: development of treatment plan with patient and/or surrogate as well as nursing, discussions with consultants, evaluation of patient's response to treatment, examination of patient, obtaining history from patient or surrogate, ordering and performing treatments and interventions, ordering and review of laboratory studies, ordering and review of radiographic studies, pulse oximetry and re-evaluation of patient's condition.  RADIOLOGY Images were viewed by me  Chest x-ray IMPRESSION: Chronic lung changes without acute findings. ____________________________________________   DIFFERENTIAL DIAGNOSIS   COPD, pneumonia, coronavirus, pneumothorax, PE, MI  FINAL ASSESSMENT AND PLAN  COPD exacerbation   Plan: The patient had presented for respiratory distress. Patient's labs are overall reassuring. Patient's imaging reveals chronic lung changes without any acute findings.  She was placed on BiPAP on arrival, given Solu-Medrol and Ativan.  Oxygen saturations were good, she still had wheezing.  She appears to be improving.  I will discuss with the hospitalist for admission.   Laurence Aly, MD    Note: This note was generated in part or whole with voice recognition software. Voice recognition is usually quite accurate but there are transcription errors that can and very often do occur. I apologize for any typographical errors that were not detected and corrected.     Earleen Newport, MD 03/30/19 [phone]

## 2019-03-30 NOTE — H&P (Signed)
[employer] [facility] [place] [facility]   PATIENT NAME: Caitlyn Rilesammy Stlaurent    MR#:  [id number]  DATE OF BIRTH:  1966-04-10  DATE OF ADMISSION:  03/30/2019  PRIMARY CARE PHYSICIAN: Corky DownsMasoud, Javed, MD   REQUESTING/REFERRING PHYSICIAN: Mayford KnifeWilliams  CHIEF COMPLAINT:   Chief Complaint  Patient presents with  . Respiratory Distress    HISTORY OF PRESENT ILLNESS: Caitlyn Evans  is a 53 y.o. female with a known history of anxiety, asthma, CHF, dysphagia, emphysema, gastroesophageal reflux disease, hypercholesterolemia, hypertension, myocardial infarction- was feeling short of breath since yesterday and came to hospital.  Denies any fever chills or cough with sputum.  She is a smoker and on oxygen at baseline for COPD.  Started on BiPAP in ER and hospitalist were called to admit after reporting covid negative.  PAST MEDICAL HISTORY:   Past Medical History:  Diagnosis Date  . Anxiety    panic attacks  . Asthma   . BOOP (bronchiolitis obliterans with organizing pneumonia) (HCC)   . CHF (congestive heart failure) (HCC)   . Dysphagia, pharyngoesophageal phase 05/30/2015  . Emphysema lung (HCC) 05/30/2015   on steroids and nebulizer for copd issues.  09/15/18  . GERD (gastroesophageal reflux disease)   . Hypercholesterolemia   . Hypertension   . Lung mass   . Migraines   . Motion sickness    all moving vehicles  . Myocardial infarction (HCC) 2012  . Respiratory failure with hypercapnia (HCC) 08/2018   was hospitalized and on bipap. steroids and nebulizer treatments helped  . Seasonal allergies   . Shortness of breath dyspnea    1 flight-stairs  . Sternal fracture     PAST SURGICAL HISTORY:  Past Surgical History:  Procedure Laterality Date  . BACK SURGERY  10/2018   [facility] fusion at [facility]  . COLONOSCOPY WITH PROPOFOL N/A 06/02/2015   Procedure: COLONOSCOPY WITH PROPOFOL;  Surgeon: Midge Miniumarren Wohl, MD;  [facility];  Service: Endoscopy;  Laterality: N/A;  .  ESOPHAGOGASTRODUODENOSCOPY (EGD) WITH PROPOFOL N/A 06/02/2015   Procedure: ESOPHAGOGASTRODUODENOSCOPY (EGD) WITH PROPOFOL withdialation;  Surgeon: Midge Miniumarren Wohl, MD;  [facility];  Service: Endoscopy;  Laterality: N/A;  . ESOPHAGOGASTRODUODENOSCOPY (EGD) WITH PROPOFOL N/A 11/05/2017   Procedure: ESOPHAGOGASTRODUODENOSCOPY (EGD) WITH PROPOFOL;  Surgeon: Pasty Spillersahiliani, Varnita B, MD;  Location: [facility];  Service: Endoscopy;  Laterality: N/A;  . HIP PINNING,CANNULATED Right 11/17/2018   Procedure: CANNULATED HIP PINNING;  Surgeon: Signa KellPatel, Sunny, MD;  Location: [facility];  Service: Orthopedics;  Laterality: Right;  . KYPHOPLASTY N/A 09/17/2018   Procedure: KYPHOPLASTY T6;  Surgeon: Kennedy BuckerMenz, Michael, MD;  Location: [facility];  Service: Orthopedics;  Laterality: N/A;  . LUNG SURGERY Left    Upper lobe removed  . OOPHORECTOMY Left   . VAGINAL HYSTERECTOMY      SOCIAL HISTORY:  Social History   Tobacco Use  . Smoking status: Current Some Day Smoker    Packs/day: 1.00    Years: 30.00    Pack years: 30.00    Types: Cigarettes  . Smokeless tobacco: Never Used  . Tobacco comment: quit some time in 2012  Substance Use Topics  . Alcohol use: No    Alcohol/week: 0.0 standard drinks    Comment: rare consumption    FAMILY HISTORY:  Family History  Problem Relation Age of Onset  . COPD Mother   . Heart attack Mother   . Stroke Father   . COPD Father     DRUG ALLERGIES:  Allergies  Allergen  Reactions  . Codeine Hives, Nausea And Vomiting and Nausea Only  . Penicillins Hives and Other (See Comments)    Has patient had a PCN reaction causing immediate rash, facial/tongue/throat swelling, SOB or lightheadedness with hypotension: No Has patient had a PCN reaction causing severe rash involving mucus membranes or skin necrosis: No Has patient had a PCN reaction that required hospitalization No Has patient had a PCN reaction occurring within the last 10 years: No If all of the above  answers are "NO", then may proceed with Cephalosporin use. Other reaction(s): Other (See Comments) Has patient had a PCN    REVIEW OF SYSTEMS:   CONSTITUTIONAL: No fever, fatigue or weakness.  EYES: No blurred or double vision.  EARS, NOSE, AND THROAT: No tinnitus or ear pain.  RESPIRATORY: have cough, shortness of breath, no wheezing or hemoptysis.  CARDIOVASCULAR: No chest pain, orthopnea, edema.  GASTROINTESTINAL: No nausea, vomiting, diarrhea or abdominal pain.  GENITOURINARY: No dysuria, hematuria.  ENDOCRINE: No polyuria, nocturia,  HEMATOLOGY: No anemia, easy bruising or bleeding SKIN: No rash or lesion. MUSCULOSKELETAL: No joint pain or arthritis.   NEUROLOGIC: No tingling, numbness, weakness.  PSYCHIATRY: No anxiety or depression.   MEDICATIONS AT HOME:  Prior to Admission medications   Medication Sig Start Date End Date Taking? Authorizing Provider  acetaminophen (TYLENOL) 650 MG CR tablet Take 650 mg by mouth every 4 (four) hours as needed for pain.   Yes [provider]  ALPRAZolam Duanne Moron) 0.5 MG tablet Take 0.5 mg by mouth 3 (three) times daily. 06/10/18  Yes [provider]  Ascorbic Acid (VITAMIN C) 1000 MG tablet Take 1,000 mg by mouth daily.   Yes [provider]  budesonide-formoterol (SYMBICORT) 160-4.5 MCG/ACT inhaler Inhale 1 puff into the lungs 2 (two) times daily.   Yes [provider]  donepezil (ARICEPT) 10 MG tablet Take 10 mg by mouth daily.    Yes [provider]  DULoxetine (CYMBALTA) 30 MG capsule Take 60 mg by mouth daily.    Yes [provider]  ipratropium-albuterol (DUONEB) 0.5-2.5 (3) MG/3ML SOLN Inhale 3 mLs into the lungs every 4 (four) hours as needed (for wheezing/shortness of breath). 05/13/16  Yes Gladstone Lighter, MD  meloxicam (MOBIC) 7.5 MG tablet Take 7.5 mg by mouth daily.   Yes [provider]  naloxone (NARCAN) nasal spray 4 mg/0.1 mL Place 1 spray into the nose once. 11/10/18   Yes [provider]  nebivolol (BYSTOLIC) 10 MG tablet Take 10 mg by mouth daily.   Yes [provider]  nicotine (NICODERM CQ) 21 mg/24hr patch Place 1 patch (21 mg total) onto the skin daily. 03/10/19  Yes Mody, Ulice Bold, MD  pregabalin (LYRICA) 100 MG capsule Take 100 mg by mouth 2 (two) times daily. 11/10/18 11/10/19 Yes [provider]  rosuvastatin (CRESTOR) 10 MG tablet Take 10 mg by mouth daily.  10/10/17  Yes [provider]  diltiazem (CARDIZEM CD) 240 MG 24 hr capsule Take 240 mg by mouth daily.  11/26/17   [provider]  lidocaine (LIDODERM) 5 % Place 1 patch onto the skin daily. Remove & Discard patch within 12 hours or as directed by MD Patient not taking: Reported on 03/30/2019 10/05/18   Gladstone Lighter, MD      PHYSICAL EXAMINATION:   VITAL SIGNS: Blood pressure (!) 142/80, pulse 89, temperature (!) 97.5 F (36.4 C), temperature source Axillary, resp. rate 17, height 4\' 9"  (1.448 m), weight 51.3 kg, SpO2 99 %.  GENERAL:  53 y.o.-year-old patient lying in the bed with acute respiratory distress.  EYES: Pupils equal, round, reactive to light and accommodation. No scleral icterus. Extraocular muscles intact.  HEENT: Head atraumatic, normocephalic. Oropharynx and nasopharynx clear.  NECK:  Supple, no jugular venous distention. No thyroid enlargement, no tenderness.  LUNGS: Normal breath sounds bilaterally, minimal wheezing, no crepitation. Positive for use of accessory muscles of respiration. On Bipap. CARDIOVASCULAR: S1, S2 normal. No murmurs, rubs, or gallops.  ABDOMEN: Soft, nontender, nondistended. Bowel sounds present. No organomegaly or mass.  EXTREMITIES: No pedal edema, cyanosis, or clubbing.  NEUROLOGIC: Cranial nerves II through XII are intact. Muscle strength 4/5 in all extremities. Sensation intact. Gait not checked.  PSYCHIATRIC: The patient is alert and oriented x 3.  SKIN: No obvious rash, lesion, or ulcer.   LABORATORY  PANEL:   CBC Recent Labs  Lab 03/30/19 0809  WBC 10.8*  HGB 12.7  HCT 42.9  PLT 302  MCV 88.6  MCH 26.2  MCHC 29.6*  RDW 17.2*  LYMPHSABS 0.6*  MONOABS 0.7  EOSABS 0.0  BASOSABS 0.0   ------------------------------------------------------------------------------------------------------------------  Chemistries  Recent Labs  Lab 03/30/19 0809  NA 142  K 3.5  CL 105  CO2 30  GLUCOSE 114*  BUN 26*  CREATININE 0.41*  CALCIUM 8.4*   ------------------------------------------------------------------------------------------------------------------ estimated creatinine clearance is 54.8 mL/min (A) (by C-G formula based on SCr of 0.41 mg/dL (L)). ------------------------------------------------------------------------------------------------------------------ No results for input(s): TSH, T4TOTAL, T3FREE, THYROIDAB in the last 72 hours.  Invalid input(s): FREET3   Coagulation profile No results for input(s): INR, PROTIME in the last 168 hours. ------------------------------------------------------------------------------------------------------------------- No results for input(s): DDIMER in the last 72 hours. -------------------------------------------------------------------------------------------------------------------  Cardiac Enzymes No results for input(s): CKMB, TROPONINI, MYOGLOBIN in the last 168 hours.  Invalid input(s): CK ------------------------------------------------------------------------------------------------------------------ Invalid input(s): POCBNP  ---------------------------------------------------------------------------------------------------------------  Urinalysis    Component Value Date/Time   COLORURINE STRAW (A) 03/08/2019 0552   APPEARANCEUR CLEAR (A) 03/08/2019 0552   APPEARANCEUR Hazy 10/13/2011 2052   LABSPEC 1.009 03/08/2019 0552   LABSPEC 1.010 10/13/2011 2052   PHURINE 7.0 03/08/2019 0552   GLUCOSEU NEGATIVE 03/08/2019  0552   GLUCOSEU Negative 10/13/2011 2052   HGBUR NEGATIVE 03/08/2019 0552   BILIRUBINUR NEGATIVE 03/08/2019 0552   BILIRUBINUR Negative 10/13/2011 2052   KETONESUR NEGATIVE 03/08/2019 0552   PROTEINUR NEGATIVE 03/08/2019 0552   NITRITE NEGATIVE 03/08/2019 0552   LEUKOCYTESUR NEGATIVE 03/08/2019 0552   LEUKOCYTESUR Negative 10/13/2011 2052     RADIOLOGY: Dg Chest 1 View  Result Date: 03/30/2019 CLINICAL DATA:  53 year old with respiratory distress.  Dyspnea. EXAM: CHEST  1 VIEW COMPARISON:  03/08/2019 FINDINGS: Again noted are postsurgical changes in the left upper lung region. Prominent scarring at the right lung apex is stable. Coarse lung markings suggestive for chronic changes but no focal airspace disease or frank pulmonary edema. Again noted is surgical spinal hardware in the upper thoracic spine and evidence of previous vertebral body augmentation procedures. IMPRESSION: Chronic lung changes without acute findings. Electronically Signed   By: Richarda OverlieAdam  Henn M.D.   On: 03/30/2019 08:26    EKG: Orders placed or performed during the hospital encounter of 03/30/19  . ED EKG  . ED EKG  . EKG 12-Lead  . EKG 12-Lead    IMPRESSION AND PLAN:  *Acute on chronic respiratory failure with hypoxia Due to COPD exacerbation  Currently on BiPAP, manage COPD as per ICU team and try to wean.  She is on oxygen at baseline.  *COPD exacerbation  IV steroid, nebulizer treatment, Levaquin, manage per ICU team.  *Active smoking Counseled to quit smoking for 4 minutes and offered nicotine patch.  *Hyperlipidemia Continue rosuvastatin.       All the records are reviewed and case discussed with ED provider. Management plans discussed with the patient, family and they are in agreement.  CODE STATUS: full.    Code Status Orders  (From admission, onward)         Start     Ordered   03/30/19 1110  Full code  Continuous     03/30/19 1109        Code Status History    Date Active  Date Inactive Code Status Order ID Comments User Context   03/08/2019 1030 03/10/2019 1747 Full Code [id number]  Alford HighlandWieting, Richard, MD ED   11/16/2018 1854 11/20/2018 1607 Full Code [id number]  Adrian SaranMody, Sital, MD ED   09/22/2018 2017 09/26/2018 1435 Full Code [id number]  Milagros LollSudini, Srikar, MD ED   09/17/2018 1005 09/17/2018 1334 Full Code [id number]  Kennedy BuckerMenz, Michael, MD Inpatient   09/13/2018 1104 09/15/2018 1529 Full Code [id number]  Alford HighlandWieting, Richard, MD ED   09/09/2018 2113 09/10/2018 1939 Full Code [id number]  Mayo, Allyn KennerKaty Dodd, MD ED   08/31/2018 1507 09/02/2018 1509 Full Code [id number]  Ihor AustinPyreddy, Pavan, MD ED   11/18/2017 2000 11/25/2017 1802 Full Code [id number]  Ihor AustinPyreddy, Pavan, MD ED   07/01/2017 2023 07/04/2017 1648 Full Code [id number]  Altamese DillingVachhani, Pollie Poma, MD ED   07/19/2016 1148 07/20/2016 2010 Full Code [id number]  Adrian SaranMody, Sital, MD Inpatient   05/09/2016 1440 05/13/2016 1437 Full Code [id number]  Merwyn KatosSimonds, David B, MD ED   02/05/2016 1914 02/08/2016 1452 Full Code [id number]  Ramonita LabGouru, Aruna, MD Inpatient   12/05/2015 2302 12/09/2015 1535 Full Code [id number]  Oralia ManisWillis, David, MD Inpatient   Advance Care Planning Activity    Advance Directive Documentation     Most Recent Value  Type of Advance Directive  Healthcare Power of Attorney, Living will  Pre-existing out of facility DNR order (yellow form or pink MOST form)  -  "MOST" Form in Place?  -       TOTAL TIME TAKING CARE OF THIS PATIENT: 35 minutes.    Altamese DillingVaibhavkumar Ikenna Ohms M.D on 03/30/2019   Between 7am to 6pm - Pager - [phone]  After 6pm go to [email or web address] - password [email or web address]  [facility] [place] [facility]  Office  [phone]  CC: Primary care physician; Corky DownsMasoud, Javed, MD   Note: This dictation was prepared with Dragon dictation along with smaller phrase technology. Any transcriptional errors that result from this process are unintentional.

## 2019-03-30 NOTE — ED Triage Notes (Signed)
Patient to ER from home via [facility] for c/o respiratory distress with h/o COPD. Patient has recent hospitalization for the same. Patient was placed on CPAP by EMS and sats were 100% upon arrival on CPAP. Patient was able to speak while being switched over to Bipap, but visibly short of breath. Patient denies any recent fevers.

## 2019-03-30 NOTE — Progress Notes (Signed)
Name: Caitlyn Evans MRN: [id number] DOB: 1966/07/16     CONSULTATION DATE: 03/30/2019  REFERRING Caitlyn Evans : Caitlyn NovemberJonathan Williams, [facility]  CHIEF COMPLAINT:  Acute respiratory distress secondary to COPD exacerbation.    HISTORY OF PRESENT ILLNESS:  53 year old female with history of COPD, asthma, BOOP, CHF, left lung partial resection, HTN, and anxiety presented to ED this morning in respiratory distress. On 03/08/2019 patient presented to the ED with a similar presentation, and was brought to the ICU and stabilized on BiPAP. Patient notes that she had been treated over the past 2-3 weeks for an upper respiratory infection, under the care of her PCP Dr. Cleatis Evans, with [facility], which she completed yesterday. This morning she had a panic attack, which triggered her COPD and she became short of breath, then called EMS who brought her in on CPAP. IN the ED, she was in respiratory distress, and was transferred to the ICU for management.   Patient started on BiPAP upon admission, with improvement in dyspnea. Patient notes a worsening cough productive of yellow/green sputum, and chills, but no fevers. She did experienced lightheadedness this AM before she called EMS.  She endorses palpitations when dyspneic. Otherwise patient denies chest pain/discomfort, nausea/vomiting, edema.   PAST MEDICAL HISTORY :   has a past medical history of Anxiety, Asthma, BOOP (bronchiolitis obliterans with organizing pneumonia) (HCC), CHF (congestive heart failure) (HCC), Dysphagia, pharyngoesophageal phase (05/30/2015), Emphysema lung (HCC) (05/30/2015), GERD (gastroesophageal reflux disease), Hypercholesterolemia, Hypertension, Lung mass, Migraines, Motion sickness, Myocardial infarction (HCC) (2012), Respiratory failure with hypercapnia (HCC) (08/2018), Seasonal allergies, Shortness of breath dyspnea, and Sternal fracture.  has a past surgical history that includes Vaginal hysterectomy; Lung surgery (Left);  Oophorectomy (Left); Colonoscopy with propofol (N/A, 06/02/2015); Esophagogastroduodenoscopy (egd) with propofol (N/A, 06/02/2015); Esophagogastroduodenoscopy (egd) with propofol (N/A, 11/05/2017); Kyphoplasty (N/A, 09/17/2018); Back surgery (10/2018); and Hip pinning, cannulated (Right, 11/17/2018). Prior to Admission medications   Medication Sig Start Date End Date Taking? Authorizing Provider  acetaminophen (Caitlyn Evans) 650 MG CR tablet Take 650 mg by mouth every 4 (four) hours as needed for pain.   Yes Provider, Historical, Caitlyn Evans  ALPRAZolam Prudy Feeler(XANAX) 0.5 MG tablet Take 0.5 mg by mouth 3 (three) times daily. 06/10/18  Yes Provider, Historical, Caitlyn Evans  Ascorbic Acid (VITAMIN C) 1000 MG tablet Take 1,000 mg by mouth daily.   Yes Provider, Historical, Caitlyn Evans  budesonide-formoterol (SYMBICORT) 160-4.5 MCG/ACT inhaler Inhale 1 puff into the lungs 2 (two) times daily.   Yes Provider, Historical, Caitlyn Evans  donepezil (ARICEPT) 10 MG tablet Take 10 mg by mouth daily.    Yes Provider, Historical, Caitlyn Evans  Caitlyn Evans (CYMBALTA) 30 MG capsule Take 60 mg by mouth daily.    Yes Provider, Historical, Caitlyn Evans  ipratropium-albuterol (Caitlyn Evans) 0.5-2.5 (3) MG/3ML SOLN Inhale 3 mLs into the lungs every 4 (four) hours as needed (for wheezing/shortness of breath). 05/13/16  Yes Caitlyn Evans, Radhika, Caitlyn Evans  meloxicam (Caitlyn Evans) 7.5 MG tablet Take 7.5 mg by mouth daily.   Yes Provider, Historical, Caitlyn Evans  naloxone (Caitlyn Evans) nasal spray 4 mg/0.1 mL Place 1 spray into the nose once. 11/10/18  Yes Provider, Historical, Caitlyn Evans  nebivolol (Caitlyn Evans) 10 MG tablet Take 10 mg by mouth daily.   Yes Provider, Historical, Caitlyn Evans  nicotine (NICODERM CQ) 21 mg/24hr patch Place 1 patch (21 mg total) onto the skin daily. 03/10/19  Yes Caitlyn Evans, Caitlyn PesaSital, Caitlyn Evans  pregabalin (Caitlyn Evans) 100 MG capsule Take 100 mg by mouth 2 (two) times daily. 11/10/18 11/10/19 Yes Provider, Historical, Caitlyn Evans  Caitlyn Evans (CRESTOR) 10  MG tablet Take 10 mg by mouth daily.  10/10/17  Yes Provider, Historical, Caitlyn Evans  diltiazem (Caitlyn Evans)  240 MG 24 hr capsule Take 240 mg by mouth daily.  11/26/17   Provider, Historical, Caitlyn Evans  Caitlyn Evans (LIDODERM) 5 % Place 1 patch onto the skin daily. Remove & Discard patch within 12 hours or as directed by Caitlyn Evans Patient not taking: Reported on 03/30/2019 10/05/18   Caitlyn Lighter, Caitlyn Evans   Allergies  Allergen Reactions  . Codeine Hives, Nausea And Vomiting and Nausea Only  . Penicillins Hives and Other (See Comments)    Has patient had a PCN reaction causing immediate rash, facial/tongue/throat swelling, SOB or lightheadedness with hypotension: No Has patient had a PCN reaction causing severe rash involving mucus membranes or skin necrosis: No Has patient had a PCN reaction that required hospitalization No Has patient had a PCN reaction occurring within the last 10 years: No If all of the above answers are "NO", then may proceed with Cephalosporin use. Other reaction(s): Other (See Comments) Has patient had a PCN    FAMILY HISTORY:  family history includes COPD in her father and mother; Heart attack in her mother; Stroke in her father. SOCIAL HISTORY:  reports that she has been smoking cigarettes. She has a 30.00 pack-year smoking history. She has never used smokeless tobacco. She reports current drug use. Drug: Marijuana. She reports that she does not drink alcohol.   Review of Systems:  Gen:  Denies  fever, sweats, +chills  +weight loss  HEENT: Denies blurred vision, double vision, ear pain, eye pain, hearing loss, nose bleeds, sore throat +rhinorhea Cardiac:  No dizziness, chest pain or heaviness, chest tightness,edema, No JVD Resp:   + cough, +sputum production, +shortness of breath,+wheezing, -hemoptysis,  Gi: Denies swallowing difficulty, stomach pain, nausea or vomiting, diarrhea, constipation, bowel incontinence Gu:  Denies bladder incontinence, burning urine Ext:   Denies Joint pain, stiffness or swelling Skin: eccymosis Endoc:  Denies polyuria, polydipsia , polyphagia or weight  change Psych:   Denies depression, insomnia or hallucinations  Other:  All other systems negative  VITAL SIGNS: Temp:  [97.5 F (36.4 C)-98.1 F (36.7 C)] 97.5 F (36.4 C) (07/14 1042) Pulse Rate:  [63-79] 73 (07/14 1030) Resp:  [17-33] 18 (07/14 1030) BP: (110-141)/(67-84) 117/74 (07/14 1030) SpO2:  [99 %-100 %] 99 % (07/14 1030) Weight:  [51.3 kg] 51.3 kg (07/14 0755)     SpO2: 99 %   Physical Examination:  GENERAL: critically ill appearing, resting comfortably with use of BiPAP though continued increased work of breathing. HEAD: Normocephalic, atraumatic.  EYES: Pupils equal, round, reactive to light.  No scleral icterus.  MOUTH: Moist mucosal membrane. No cyanosis NECK: No JVD.  PULMONARY: Diffuse wheezing with occasional rhonchi. CARDIOVASCULAR: S1 and S2. Regular rate and rhythm. No murmurs, rubs, or gallops.  GASTROINTESTINAL: Soft, nontender, -distended. No masses. Positive bowel sounds. No hepatosplenomegaly.  MUSCULOSKELETAL: Gross motor movements in tact.  EXTREMITIES: No swelling, or edema. + clubbing. NEUROLOGIC: Alert and oriented to time place and person. GCS score 15.  SKIN:intact, warm, dry. Multiple bilateral lower extremity ecchymoses.   I personally reviewed lab work that was obtained in last 24 hrs. CXR Independently reviewed- by me today No acute edema, no pneumonia, no effusions   MEDICATIONS: I have reviewed all medications and confirmed regimen as documented   CULTURE RESULTS   Recent Results (from the past 240 hour(s))  SARS Coronavirus 2 (CEPHEID- Performed in [place] hospital lab), [facility]  Status: None   Collection Time: 03/30/19  8:09 AM   Specimen: Nasopharyngeal Swab  Result Value Ref Range Status   SARS Coronavirus 2 NEGATIVE NEGATIVE Final    Comment: (NOTE) If result is NEGATIVE SARS-CoV-2 target nucleic acids are NOT DETECTED. The SARS-CoV-2 RNA is generally detectable in upper and lower  respiratory specimens during  the acute phase of infection. The lowest  concentration of SARS-CoV-2 viral copies this assay can detect is 250  copies / mL. A negative result does not preclude SARS-CoV-2 infection  and should not be used as the sole basis for treatment or other  patient management decisions.  A negative result may occur with  improper specimen collection / handling, submission of specimen other  than nasopharyngeal swab, presence of viral mutation(s) within the  areas targeted by this assay, and inadequate number of viral copies  (<250 copies / mL). A negative result must be combined with clinical  observations, patient history, and epidemiological information. If result is POSITIVE SARS-CoV-2 target nucleic acids are DETECTED. The SARS-CoV-2 RNA is generally detectable in upper and lower  respiratory specimens dur ing the acute phase of infection.  Positive  results are indicative of active infection with SARS-CoV-2.  Clinical  correlation with patient history and other diagnostic information is  necessary to determine patient infection status.  Positive results do  not rule out bacterial infection or co-infection with other viruses. If result is PRESUMPTIVE POSTIVE SARS-CoV-2 nucleic acids MAY BE PRESENT.   A presumptive positive result was obtained on the submitted specimen  and confirmed on repeat testing.  While 2019 novel coronavirus  (SARS-CoV-2) nucleic acids may be present in the submitted sample  additional confirmatory testing may be necessary for epidemiological  and / or clinical management purposes  to differentiate between  SARS-CoV-2 and other Sarbecovirus currently known to infect humans.  If clinically indicated additional testing with an alternate test  methodology [phone]([id number]) is advised. The SARS-CoV-2 RNA is generally  detectable in upper and lower respiratory sp ecimens during the acute  phase of infection. The expected result is Negative. Fact Sheet for Patients:   BoilerBrush.com.cyhttps://www.fda.gov/media/136312/download Fact Sheet for Healthcare Providers: https://pope.com/https://www.fda.gov/media/136313/download This test is not yet approved or cleared by the [place] FDA and has been authorized for detection and/or diagnosis of SARS-CoV-2 by FDA under an Emergency Use Authorization (EUA).  This EUA will remain in effect (meaning this test can be used) for the duration of the COVID-19 declaration under Section 564(b)(1) of the Act, 21 U.S.C. section 360bbb-3(b)(1), unless the authorization is terminated or revoked sooner. Performed at [facility], [address] [place], [place] [address]           IMAGING    Dg Chest 1 View  Result Date: 03/30/2019 CLINICAL DATA:  53 year old with respiratory distress.  Dyspnea. EXAM: CHEST  1 VIEW COMPARISON:  03/08/2019 FINDINGS: Again noted are postsurgical changes in the left upper lung region. Prominent scarring at the right lung apex is stable. Coarse lung markings suggestive for chronic changes but no focal airspace disease or frank pulmonary edema. Again noted is surgical spinal hardware in the upper thoracic spine and evidence of previous vertebral body augmentation procedures. IMPRESSION: Chronic lung changes without acute findings. Electronically Signed   By: Richarda OverlieAdam  Henn M.D.   On: 03/30/2019 08:26    ASSESSMENT AND PLAN SYNOPSIS  53 year old White female admitted for acute and severe hypoxic respiratory failure from acute COPD exacerbation. Probable underlying residual infection, in the  setting of active smoking. Probable contributing factor could be acute diastolic heart failure.   Severe ACUTE Hypoxic Respiratory Failure - Patient tolerating O2 via Nectar. Continue trial off BiPAP with BiPAP PRN.  - Continue Bronchodilator Therapy with Caitlyn Evans. - Continue prophylactic antibiotics (Rocephin and Zithromax).  - Patient notes recent antibiotic use with Levoquin. Because she is not improving after antibiotic  treatment outpatient, and because of her immunocompromised state due to chronic corticosteroid use for COPD management outpatient, sputum cultures ordered. - Continue methylprednisolone.   ACUTE DIASTOLIC CARDIAC FAILURE- EF - oxygen as needed.  - Repeat ECHO ordered.  - Patient not actively following with outpatient cardiology.    SEVERE COPD EXACERBATION -continue IV steroids as prescribed -continue NEB THERAPY as prescribed -morphine as needed -wean fio2 as needed and tolerated   CARDIAC ICU monitoring  GI GI PROPHYLAXIS as indicated  NUTRITIONAL STATUS DIET--> NPO.  Constipation protocol as indicated   ENDO - will use ICU hypoglycemic\Hyperglycemia protocol if needed   ELECTROLYTES -follow labs as needed -replace as needed -pharmacy consultation and following   DVT/GI PRX ordered TRANSFUSIONS AS NEEDED MONITOR FSBS ASSESS the need for LABS as needed   Critical Care Time devoted to patient care services described in this note is 34 minutes.   Overall, patient is critically ill, prognosis is guarded.    Lucie LeatherKurian David Roe Wilner, M.D.  [facility]  Medical Director [facility] Medical Director [facility] Cardio-Pulmonary Department

## 2019-03-30 NOTE — ED Notes (Signed)
2 previous IV attempts made by 2nd RN, one attempt by EMS unsuccessfully.

## 2019-03-30 NOTE — ED Notes (Signed)
Patient is resting much more comfortably, states she feels more comfortable. Sats have remained 100% throughout since arrival. Patient tolerating Bipap well. No other needs at this time.

## 2019-03-30 NOTE — ED Notes (Signed)
Patient's Bipap continues to alarm and reset itself. Patient appears comfortable. Patient states she overall doesn't feel well, but breathing is better. RT to come assess Bipap machine settings.

## 2019-03-31 LAB — CBC
HCT: 45.6 % (ref 36.0–46.0)
Hemoglobin: 14 g/dL (ref 12.0–15.0)
MCH: 26.6 pg (ref 26.0–34.0)
MCHC: 30.7 g/dL (ref 30.0–36.0)
MCV: 86.7 fL (ref 80.0–100.0)
Platelets: 336 10*3/uL (ref 150–400)
RBC: 5.26 MIL/uL — ABNORMAL HIGH (ref 3.87–5.11)
RDW: 16.6 % — ABNORMAL HIGH (ref 11.5–15.5)
WBC: 10.1 10*3/uL (ref 4.0–10.5)
nRBC: 0 % (ref 0.0–0.2)

## 2019-03-31 LAB — BASIC METABOLIC PANEL
Anion gap: 9 (ref 5–15)
BUN: 21 mg/dL — ABNORMAL HIGH (ref 6–20)
CO2: 31 mmol/L (ref 22–32)
Calcium: 9 mg/dL (ref 8.9–10.3)
Chloride: 100 mmol/L (ref 98–111)
Creatinine, Ser: 0.52 mg/dL (ref 0.44–1.00)
GFR calc Af Amer: >60 mL/min (ref >60)
GFR calc non Af Amer: >60 mL/min (ref >60)
Glucose, Bld: 118 mg/dL — ABNORMAL HIGH (ref 70–99)
Potassium: 4.2 mmol/L (ref 3.5–5.1)
Sodium: 140 mmol/L (ref 135–145)

## 2019-03-31 LAB — ECHOCARDIOGRAM COMPLETE
Height: 57 in
Weight: 1809.54 oz

## 2019-03-31 MED ORDER — CEFUROXIME AXETIL 250 MG PO TABS: 250.0000 mg | ORAL_TABLET | Freq: Two times a day (BID) | ORAL | 0 refills | Status: AC

## 2019-03-31 MED ORDER — BUTALBITAL-APAP-CAFFEINE 50-325-40 MG PO TABS
1.0000 | ORAL_TABLET | Freq: Four times a day (QID) | ORAL | Status: DC | PRN
  Administered 2019-03-31: 1 via ORAL
  Filled 2019-03-31: qty 1

## 2019-03-31 MED ORDER — AZITHROMYCIN 250 MG PO TABS: 250.0000 mg | ORAL_TABLET | Freq: Every day | ORAL | 0 refills | Status: AC

## 2019-03-31 MED ORDER — SPIRIVA RESPIMAT 1.25 MCG/ACT IN AERS: 2.0000 | INHALATION_SPRAY | Freq: Every day | RESPIRATORY_TRACT | 0 refills | Status: AC

## 2019-03-31 MED ORDER — PREDNISONE 10 MG (21) PO TBPK: ORAL_TABLET | ORAL | 0 refills | Status: AC

## 2019-03-31 NOTE — Progress Notes (Signed)
Name: Waneta Martinsammy M Tetreault MRN: [id number] DOB: 08/30/1966     CONSULTATION DATE: 03/30/2019  CHIEF COMPLAINT:  Acute respiratory distress secondary to COPD exacerbation. HISTORY OF PRESENT ILLNESS:  53 year old Caucasian female with history of severe COPD, current daily smoker, diastolic HF, hx BOOP and partial left lung resection brought into the ICU yesterday after presenting to the ED in respiratory distress. Patient woke on the morning of 03/29/2019 extremely short of breath, and began to have a panic attack. Patient had been started on BiPAP, and has been on O2 via [place] yesterday evening and through the night. She is currently on 3L O2, and she feels her breathing has improved. She currently is experiencing a headache, which has improved with Fioricet. She denies cough or sputum production, fevers, chills, chest pain. Of note, patient recently was treated with Levaquin outpatient for an upper respiratory infection. Her last dose of Levaquin was 03/29/2019, and she has cut down her smoking to 3-4 cigarettes per day.   PAST MEDICAL HISTORY :   has a past medical history of Anxiety, Asthma, BOOP (bronchiolitis obliterans with organizing pneumonia) (HCC), CHF (congestive heart failure) (HCC), Dysphagia, pharyngoesophageal phase (05/30/2015), Emphysema lung (HCC) (05/30/2015), GERD (gastroesophageal reflux disease), Hypercholesterolemia, Hypertension, Lung mass, Migraines, Motion sickness, Myocardial infarction (HCC) (2012), Respiratory failure with hypercapnia (HCC) (08/2018), Seasonal allergies, Shortness of breath dyspnea, and Sternal fracture.  has a past surgical history that includes Vaginal hysterectomy; Lung surgery (Left); Oophorectomy (Left); Colonoscopy with propofol (N/A, 06/02/2015); Esophagogastroduodenoscopy (egd) with propofol (N/A, 06/02/2015); Esophagogastroduodenoscopy (egd) with propofol (N/A, 11/05/2017); Kyphoplasty (N/A, 09/17/2018); Back surgery (10/2018); and Hip pinning, cannulated (Right,  11/17/2018). Prior to Admission medications   Medication Sig Start Date End Date Taking? Authorizing Provider  acetaminophen (TYLENOL) 650 MG CR tablet Take 650 mg by mouth every 4 (four) hours as needed for pain.   Yes [provider]  ALPRAZolam Prudy Feeler(XANAX) 0.5 MG tablet Take 0.5 mg by mouth 3 (three) times daily. 06/10/18  Yes [provider]  Ascorbic Acid (VITAMIN C) 1000 MG tablet Take 1,000 mg by mouth daily.   Yes [provider]  budesonide-formoterol (SYMBICORT) 160-4.5 MCG/ACT inhaler Inhale 1 puff into the lungs 2 (two) times daily.   Yes [provider]  donepezil (ARICEPT) 10 MG tablet Take 10 mg by mouth daily.    Yes [provider]  DULoxetine (CYMBALTA) 30 MG capsule Take 60 mg by mouth daily.    Yes [provider]  ipratropium-albuterol (DUONEB) 0.5-2.5 (3) MG/3ML SOLN Inhale 3 mLs into the lungs every 4 (four) hours as needed (for wheezing/shortness of breath). 05/13/16  Yes Enid BaasKalisetti, Radhika, MD  meloxicam (MOBIC) 7.5 MG tablet Take 7.5 mg by mouth daily.   Yes [provider]  naloxone (NARCAN) nasal spray 4 mg/0.1 mL Place 1 spray into the nose once. 11/10/18  Yes [provider]  nebivolol (BYSTOLIC) 10 MG tablet Take 10 mg by mouth daily.   Yes [provider]  nicotine (NICODERM CQ) 21 mg/24hr patch Place 1 patch (21 mg total) onto the skin daily. 03/10/19  Yes Mody, Patricia PesaSital, MD  pregabalin (LYRICA) 100 MG capsule Take 100 mg by mouth 2 (two) times daily. 11/10/18 11/10/19 Yes [provider]  rosuvastatin (CRESTOR) 10 MG tablet Take 10 mg by mouth daily.  10/10/17  Yes [provider]  diltiazem (CARDIZEM CD) 240 MG 24 hr capsule Take 240 mg by mouth daily.  11/26/17   [provider]  lidocaine (LIDODERM) 5 %  Place 1 patch onto the skin daily. Remove & Discard patch within 12 hours or as directed by MD Patient not taking: Reported on 03/30/2019 10/05/18   Gladstone Lighter, MD    Allergies  Allergen Reactions  . Codeine Hives, Nausea And Vomiting and Nausea Only  . Penicillins Hives and Other (See Comments)    Has patient had a PCN reaction causing immediate rash, facial/tongue/throat swelling, SOB or lightheadedness with hypotension: No Has patient had a PCN reaction causing severe rash involving mucus membranes or skin necrosis: No Has patient had a PCN reaction that required hospitalization No Has patient had a PCN reaction occurring within the last 10 years: No If all of the above answers are "NO", then may proceed with Cephalosporin use. Other reaction(s): Other (See Comments) Has patient had a PCN    Review of Systems:  Gen:  Denies  fever, sweats, chills, dizziness, lightheadedness, + headache, +weight loss, + decreased appetite HEENT: Denies ear pain, eye pain, sore throat, rhinorrhea, congestion. Cardiac:  No, chest pain or heaviness, chest tightness, edema, No JVD Resp:   No cough, -sputum production, + shortness of breath, + wheezing, -hemoptysis,  Gi: Denies stomach pain, nausea or vomiting, diarrhea, constipation, bowel incontinence Gu:  Denies bladder incontinence, burning urine Ext:   Denies leg pain, or swelling Skin: Denies  skin rash,  or bleeding or hives, + easy bruising Other:  All other systems negative.    VITAL SIGNS: Temp:  [97.5 F (36.4 C)-98.5 F (36.9 C)] 98.5 F (36.9 C) (07/15 0800) Pulse Rate:  [64-125] 74 (07/15 0800) Resp:  [15-27] 16 (07/15 0800) BP: (113-147)/(61-101) 147/80 (07/15 0928) SpO2:  [92 %-100 %] 100 % (07/15 0800)   I/O last 3 completed shifts: In: -  Out: 300 [Urine:300] No intake/output data recorded.   SpO2: 100 % O2 Flow Rate (L/min): 3 L/min   Physical Examination:  GENERAL: critically ill appearing, + resp distress, thin. HEAD: Normocephalic, atraumatic.  EYES: Pupils equal, round, reactive to light.  No scleral icterus.  MOUTH: Moist mucosal membrane. NECK: No JVD.   PULMONARY: ,  +wheezing, + rales on inspiration. CARDIOVASCULAR: S1 and S2. Regular rate and rhythm. No murmurs, rubs, or gallops.  GASTROINTESTINAL: Soft, nontender, -distended. No masses. Positive bowel sounds. MUSCULOSKELETAL: No swelling, clubbing, or edema.  EXTREMITIES: 2+ pulses at dorsalis pedis. Radial pulse 1+. No evidence of cording, erythema. Negative Homan's sign. NEUROLOGIC: Alert and oriented to time, place and person. SKIN:intact,warm,dry. Multiple ecchymoses on bilateral calves.    MEDICATIONS: I have reviewed all medications and confirmed regimen as documented   CULTURE RESULTS   Recent Results (from the past 240 hour(s))  SARS Coronavirus 2 (CEPHEID- Performed in Between hospital lab), Hosp Order     Status: None   Collection Time: 03/30/19  8:09 AM   Specimen: Nasopharyngeal Swab  Result Value Ref Range Status   SARS Coronavirus 2 NEGATIVE NEGATIVE Final    Comment: (NOTE) If result is NEGATIVE SARS-CoV-2 target nucleic acids are NOT DETECTED. The SARS-CoV-2 RNA is generally detectable in upper and lower  respiratory specimens during the acute phase of infection. The lowest  concentration of SARS-CoV-2 viral copies this assay can detect is 250  copies / mL. A negative result does not preclude SARS-CoV-2 infection  and should not be used as the sole basis for treatment or other  patient management decisions.  A negative result may occur with  improper specimen collection / handling, submission of specimen other  than nasopharyngeal swab,  presence of viral mutation(s) within the  areas targeted by this assay, and inadequate number of viral copies  (<250 copies / mL). A negative result must be combined with clinical  observations, patient history, and epidemiological information. If result is POSITIVE SARS-CoV-2 target nucleic acids are DETECTED. The SARS-CoV-2 RNA is generally detectable in upper and lower  respiratory specimens dur ing the acute phase of infection.   Positive  results are indicative of active infection with SARS-CoV-2.  Clinical  correlation with patient history and other diagnostic information is  necessary to determine patient infection status.  Positive results do  not rule out bacterial infection or co-infection with other viruses. If result is PRESUMPTIVE POSTIVE SARS-CoV-2 nucleic acids MAY BE PRESENT.   A presumptive positive result was obtained on the submitted specimen  and confirmed on repeat testing.  While 2019 novel coronavirus  (SARS-CoV-2) nucleic acids may be present in the submitted sample  additional confirmatory testing may be necessary for epidemiological  and / or clinical management purposes  to differentiate between  SARS-CoV-2 and other Sarbecovirus currently known to infect humans.  If clinically indicated additional testing with an alternate test  methodology [id number]) is advised. The SARS-CoV-2 RNA is generally  detectable in upper and lower respiratory sp ecimens during the acute  phase of infection. The expected result is Negative. Fact Sheet for Patients:  BoilerBrush.com.cyhttps://www.fda.gov/media/136312/download Fact Sheet for Healthcare Providers: https://pope.com/https://www.fda.gov/media/136313/download This test is not yet approved or cleared by the [place] FDA and has been authorized for detection and/or diagnosis of SARS-CoV-2 by FDA under an Emergency Use Authorization (EUA).  This EUA will remain in effect (meaning this test can be used) for the duration of the COVID-19 declaration under Section 564(b)(1) of the Act, 21 U.S.C. section 360bbb-3(b)(1), unless the authorization is terminated or revoked sooner. Performed at [facility], [address] [place], [place] [id number]      ASSESSMENT AND PLAN SYNOPSIS: 53 year old Caucasian female with acute respiratory distress in the setting of COPD exacerbation. Identifiable causes could include current daily smoking, or unresolved previous bacterial upper  respiratory infection.   Severe ACUTE Hypoxic Respiratory Failure in the setting of COPD exacerbation resp status much improved -continue Bronchodilator Therapy with Duoneb. -Patient currently on 3L Fio2 via [place], with no requirement for BiPAP over night.  - IV steroids will be changed to PO Prednisone with taper before discharge. - Start Spiriva Respimat and continue Symbicort in place of Dulera.   ACUTE DIASTOLIC CARDIAC FAILURE-  - ECHO completed and results pending. No evidence of decompensated heart failure on exam. Follow up with results in outpatient setting. -oxygen as needed. --follow up cardiac enzymes as indicated -follow up cardiology recs  CARDIAC ICU monitoring  ID - Sputum cultures completed and results pending.  - Discontinue IV Zithromax. Patient to transition to PO Zithromax.   - follow up sputum cultures in outpatient setting.   GI GI PROPHYLAXIS as indicated  NUTRITIONAL STATUS DIET--> as tolerated Constipation protocol as indicated   ENDO - will use ICU hypoglycemic\Hyperglycemia protocol if needed  ELECTROLYTES -follow labs as needed -replace as needed -pharmacy consultation and following  DVT/GI PRX ordered TRANSFUSIONS AS NEEDED MONITOR FSBS ASSESS the need for LABS   Plan to discharge home today  Lucie LeatherKurian David Derik Fults, M.D.  [facility]  Medical Director [facility] Medical Director [facility]

## 2019-04-02 NOTE — Discharge Summary (Signed)
[facility] [place] NAME: Caitlyn Evans    MR#:  [id number]  DATE OF BIRTH:  1966/08/13  DATE OF ADMISSION:  03/30/2019 ADMITTING PHYSICIAN: Vaughan Basta, MD  DATE OF DISCHARGE: 03/31/2019 12:04 PM  PRIMARY CARE PHYSICIAN: Cletis Athens, MD    ADMISSION DIAGNOSIS:  COPD exacerbation ([place]) [J44.1]  DISCHARGE DIAGNOSIS:  Active Problems:   COPD exacerbation ([place])   Acute exacerbation of chronic obstructive pulmonary disease (COPD) ([place])   Acute on chronic respiratory failure ([place])   SECONDARY DIAGNOSIS:   Past Medical History:  Diagnosis Date  . Anxiety    panic attacks  . Asthma   . BOOP (bronchiolitis obliterans with organizing pneumonia) ([place])   . CHF (congestive heart failure) ([place])   . Dysphagia, pharyngoesophageal phase 05/30/2015  . Emphysema lung ([place]) 05/30/2015   on steroids and nebulizer for copd issues.  09/15/18  . GERD (gastroesophageal reflux disease)   . Hypercholesterolemia   . Hypertension   . Lung mass   . Migraines   . Motion sickness    all moving vehicles  . Myocardial infarction ([place]) 2012  . Respiratory failure with hypercapnia ([place]) 08/2018   was hospitalized and on bipap. steroids and nebulizer treatments helped  . Seasonal allergies   . Shortness of breath dyspnea    1 flight-stairs  . Sternal fracture     HOSPITAL COURSE:   *Acute on chronic respiratory failure with hypoxia Due to COPD exacerbation  Currently on BiPAP, manage COPD as per ICU team and try to wean.  She is on oxygen at baseline. Pt improved, off BIPAP, now on nasal canula.  *COPD exacerbation IV steroid, nebulizer treatment, Levaquin, manage per ICU team.  *Active smoking Counseled to quit smoking for 4 minutes and offered nicotine patch.  *Hyperlipidemia Continue rosuvastatin.  DISCHARGE CONDITIONS:   Stable.  CONSULTS OBTAINED:    DRUG ALLERGIES:   Allergies  Allergen Reactions  .  Codeine Hives, Nausea And Vomiting and Nausea Only  . Penicillins Hives and Other (See Comments)    Has patient had a PCN reaction causing immediate rash, facial/tongue/throat swelling, SOB or lightheadedness with hypotension: No Has patient had a PCN reaction causing severe rash involving mucus membranes or skin necrosis: No Has patient had a PCN reaction that required hospitalization No Has patient had a PCN reaction occurring within the last 10 years: No If all of the above answers are "NO", then may proceed with Cephalosporin use. Other reaction(s): Other (See Comments) Has patient had a PCN    DISCHARGE MEDICATIONS:   Allergies as of 03/31/2019      Reactions   Codeine Hives, Nausea And Vomiting, Nausea Only   Penicillins Hives, Other (See Comments)   Has patient had a PCN reaction causing immediate rash, facial/tongue/throat swelling, SOB or lightheadedness with hypotension: No Has patient had a PCN reaction causing severe rash involving mucus membranes or skin necrosis: No Has patient had a PCN reaction that required hospitalization No Has patient had a PCN reaction occurring within the last 10 years: No If all of the above answers are "NO", then may proceed with Cephalosporin use. Other reaction(s): Other (See Comments) Has patient had a PCN      Medication List    STOP taking these medications   meloxicam 7.5 MG tablet Commonly known as: MOBIC     TAKE these medications   acetaminophen 650 MG CR tablet Commonly known as: TYLENOL Take 650 mg by mouth every  4 (four) hours as needed for pain.   ALPRAZolam 0.5 MG tablet Commonly known as: XANAX Take 0.5 mg by mouth 3 (three) times daily.   azithromycin 250 MG tablet Commonly known as: Zithromax Z-Pak Take 1 tablet (250 mg total) by mouth daily for 3 days. Take 2 tablets (500 mg) on  Day 1,  followed by 1 tablet (250 mg) once daily on Days 2 through 5.   budesonide-formoterol 160-4.5 MCG/ACT inhaler Commonly known  as: SYMBICORT Inhale 1 puff into the lungs 2 (two) times daily.   cefUROXime 250 MG tablet Commonly known as: Ceftin Take 1 tablet (250 mg total) by mouth 2 (two) times daily for 3 days.   diltiazem 240 MG 24 hr capsule Commonly known as: CARDIZEM CD Take 240 mg by mouth daily.   donepezil 10 MG tablet Commonly known as: ARICEPT Take 10 mg by mouth daily.   DULoxetine 30 MG capsule Commonly known as: CYMBALTA Take 60 mg by mouth daily.   ipratropium-albuterol 0.5-2.5 (3) MG/3ML Soln Commonly known as: DUONEB Inhale 3 mLs into the lungs every 4 (four) hours as needed (for wheezing/shortness of breath).   lidocaine 5 % Commonly known as: LIDODERM Place 1 patch onto the skin daily. Remove & Discard patch within 12 hours or as directed by MD   naloxone 4 MG/0.1ML Liqd nasal spray kit Commonly known as: NARCAN Place 1 spray into the nose once.   nebivolol 10 MG tablet Commonly known as: BYSTOLIC Take 10 mg by mouth daily.   nicotine 21 mg/24hr patch Commonly known as: Nicoderm CQ Place 1 patch (21 mg total) onto the skin daily.   predniSONE 10 MG (21) Tbpk tablet Commonly known as: STERAPRED UNI-PAK 21 TAB Take 6 tabs first day, 5 tab on day 2, then 4 on day 3rd, 3 tabs on day 4th , 2 tab on day 5th, and 1 tab on 6th day.   pregabalin 100 MG capsule Commonly known as: LYRICA Take 100 mg by mouth 2 (two) times daily.   rosuvastatin 10 MG tablet Commonly known as: CRESTOR Take 10 mg by mouth daily.   Spiriva Respimat 1.25 MCG/ACT Aers Generic drug: Tiotropium Bromide Monohydrate Inhale 2 Inhalers into the lungs daily.   vitamin C 1000 MG tablet Take 1,000 mg by mouth daily.        DISCHARGE INSTRUCTIONS:   Follow with PMD in 1-2 weeks.  If you experience worsening of your admission symptoms, develop shortness of breath, life threatening emergency, suicidal or homicidal thoughts you must seek medical attention immediately by calling 911 or calling your MD  immediately  if symptoms less severe.  You Must read complete instructions/literature along with all the possible adverse reactions/side effects for all the Medicines you take and that have been prescribed to you. Take any new Medicines after you have completely understood and accept all the possible adverse reactions/side effects.   Please note  You were cared for by a hospitalist during your hospital stay. If you have any questions about your discharge medications or the care you received while you were in the hospital after you are discharged, you can call the unit and asked to speak with the hospitalist on call if the hospitalist that took care of you is not available. Once you are discharged, your primary care physician will handle any further medical issues. Please note that NO REFILLS for any discharge medications will be authorized once you are discharged, as it is imperative that you return to your  primary care physician (or establish a relationship with a primary care physician if you do not have one) for your aftercare needs so that they can reassess your need for medications and monitor your lab values.    Today   CHIEF COMPLAINT:   Chief Complaint  Patient presents with  . Respiratory Distress    HISTORY OF PRESENT ILLNESS:  Caitlyn Evans  is a 53 y.o. female with a known history of anxiety, asthma, CHF, dysphagia, emphysema, gastroesophageal reflux disease, hypercholesterolemia, hypertension, myocardial infarction- was feeling short of breath since yesterday and came to hospital.  Denies any fever chills or cough with sputum.  She is a smoker and on oxygen at baseline for COPD.  Started on BiPAP in ER and hospitalist were called to admit after reporting covid negative.   VITAL SIGNS:  Blood pressure 126/85, pulse 83, temperature 98.5 F (36.9 C), temperature source Oral, resp. rate (!) 22, height _0  (1.448 m), weight 51.3 kg, SpO2 100 %.  I/O:  No intake or output data  in the 24 hours ending 04/02/19 1632  PHYSICAL EXAMINATION:  GENERAL:  53 y.o.-year-old patient lying in the bed with no acute distress.  EYES: Pupils equal, round, reactive to light and accommodation. No scleral icterus. Extraocular muscles intact.  HEENT: Head atraumatic, normocephalic. Oropharynx and nasopharynx clear.  NECK:  Supple, no jugular venous distention. No thyroid enlargement, no tenderness.  LUNGS: Normal breath sounds bilaterally, no wheezing, rales,rhonchi or crepitation. No use of accessory muscles of respiration.  CARDIOVASCULAR: S1, S2 normal. No murmurs, rubs, or gallops.  ABDOMEN: Soft, non-tender, non-distended. Bowel sounds present. No organomegaly or mass.  EXTREMITIES: No pedal edema, cyanosis, or clubbing.  NEUROLOGIC: Cranial nerves II through XII are intact. Muscle strength 5/5 in all extremities. Sensation intact. Gait not checked.  PSYCHIATRIC: The patient is alert and oriented x 3.  SKIN: No obvious rash, lesion, or ulcer.   DATA REVIEW:   CBC Recent Labs  Lab 03/31/19 0323  WBC 10.1  HGB 14.0  HCT 45.6  PLT 336    Chemistries  Recent Labs  Lab 03/31/19 0323  NA 140  K 4.2  CL 100  CO2 31  GLUCOSE 118*  BUN 21*  CREATININE 0.52  CALCIUM 9.0    Cardiac Enzymes No results for input(s): TROPONINI in the last 168 hours.  Microbiology Results  Results for orders placed or performed during the hospital encounter of 03/30/19  SARS Coronavirus 2 (CEPHEID- Performed in [facility] hospital lab), Hosp Order     Status: None   Collection Time: 03/30/19  8:09 AM   Specimen: Nasopharyngeal Swab  Result Value Ref Range Status   SARS Coronavirus 2 NEGATIVE NEGATIVE Final    Comment: (NOTE) If result is NEGATIVE SARS-CoV-2 target nucleic acids are NOT DETECTED. The SARS-CoV-2 RNA is generally detectable in upper and lower  respiratory specimens during the acute phase of infection. The lowest  concentration of SARS-CoV-2 viral copies this assay  can detect is 250  copies / mL. A negative result does not preclude SARS-CoV-2 infection  and should not be used as the sole basis for treatment or other  patient management decisions.  A negative result may occur with  improper specimen collection / handling, submission of specimen other  than nasopharyngeal swab, presence of viral mutation(s) within the  areas targeted by this assay, and inadequate number of viral copies  (<250 copies / mL). A negative result must be combined with clinical  observations, patient history,  and epidemiological information. If result is POSITIVE SARS-CoV-2 target nucleic acids are DETECTED. The SARS-CoV-2 RNA is generally detectable in upper and lower  respiratory specimens dur ing the acute phase of infection.  Positive  results are indicative of active infection with SARS-CoV-2.  Clinical  correlation with patient history and other diagnostic information is  necessary to determine patient infection status.  Positive results do  not rule out bacterial infection or co-infection with other viruses. If result is PRESUMPTIVE POSTIVE SARS-CoV-2 nucleic acids MAY BE PRESENT.   A presumptive positive result was obtained on the submitted specimen  and confirmed on repeat testing.  While 2019 novel coronavirus  (SARS-CoV-2) nucleic acids may be present in the submitted sample  additional confirmatory testing may be necessary for epidemiological  and / or clinical management purposes  to differentiate between  SARS-CoV-2 and other Sarbecovirus currently known to infect humans.  If clinically indicated additional testing with an alternate test  methodology [phone]) is advised. The SARS-CoV-2 RNA is generally  detectable in upper and lower respiratory sp ecimens during the acute  phase of infection. The expected result is Negative. Fact Sheet for Patients:  StrictlyIdeas.no Fact Sheet for Healthcare  Providers: BankingDealers.co.za This test is not yet approved or cleared by the [place] FDA and has been authorized for detection and/or diagnosis of SARS-CoV-2 by FDA under an Emergency Use Authorization (EUA).  This EUA will remain in effect (meaning this test can be used) for the duration of the COVID-19 declaration under Section 564(b)(1) of the Act, 21 U.S.C. section 360bbb-3(b)(1), unless the authorization is terminated or revoked sooner. Performed at [facility], [address] [place], [place] [address]     RADIOLOGY:  No results found.  EKG:   Orders placed or performed during the hospital encounter of 03/30/19  . ED EKG  . ED EKG  . EKG 12-Lead  . EKG 12-Lead      Management plans discussed with the patient, family and they are in agreement.  CODE STATUS:  Code Status History    Date Active Date Inactive Code Status Order ID Comments User Context   03/30/2019 1109 03/31/2019 1504 Full Code [id number]  Vaughan Basta, MD Inpatient   03/08/2019 1030 03/10/2019 1747 Full Code [id number]  Loletha Grayer, MD ED   11/16/2018 1854 11/20/2018 1607 Full Code [id number]  Bettey Costa, MD ED   09/22/2018 2017 09/26/2018 1435 Full Code [id number]  Hillary Bow, MD ED   09/17/2018 1005 09/17/2018 1334 Full Code [id number]  Hessie Knows, MD Inpatient   09/13/2018 1104 09/15/2018 1529 Full Code [id number]  Loletha Grayer, MD ED   09/09/2018 2113 09/10/2018 1939 Full Code [id number]  Mayo, Pete Pelt, MD ED   08/31/2018 1507 09/02/2018 1509 Full Code [id number]  Saundra Shelling, MD ED   11/18/2017 2000 11/25/2017 1802 Full Code [id number]  Saundra Shelling, MD ED   07/01/2017 2023 07/04/2017 1648 Full Code [id number]  Vaughan Basta, MD ED   07/19/2016 1148 07/20/2016 2010 Full Code [id number]  Bettey Costa, MD Inpatient   05/09/2016 1440 05/13/2016 1437 Full Code [id number]  Wilhelmina Mcardle, MD ED   02/05/2016 1914 02/08/2016 1452 Full Code [id number]   Nicholes Mango, MD Inpatient   12/05/2015 2302 12/09/2015 1535 Full Code [id number]  Lance Coon, MD Inpatient   Advance Care Planning Activity    Advance Directive Documentation     Most Recent Value  Type of Advance Directive  Healthcare Power of Attorney, Living will  Pre-existing out of facility  DNR order (yellow form or pink MOST form)  -  "MOST" Form in Place?  -      TOTAL TIME TAKING CARE OF THIS PATIENT: 35 minutes.    Vaughan Basta M.D on 04/02/2019 at 4:32 PM  Between 7am to 6pm - Pager - [phone]  After 6pm go to [email or web address] - password [email or web address] [facility]  Office  [phone]  CC: Primary care physician; Cletis Athens, MD   Note: This dictation was prepared with Dragon dictation along with smaller phrase technology. Any transcriptional errors that result from this process are unintentional.

## 2019-04-05 DIAGNOSIS — R0602 Shortness of breath: Secondary | ICD-10-CM | POA: Diagnosis not present

## 2019-04-05 DIAGNOSIS — J9601 Acute respiratory failure with hypoxia: Secondary | ICD-10-CM | POA: Diagnosis not present

## 2019-04-05 DIAGNOSIS — J449 Chronic obstructive pulmonary disease, unspecified: Secondary | ICD-10-CM | POA: Diagnosis not present

## 2019-04-05 DIAGNOSIS — J441 Chronic obstructive pulmonary disease with (acute) exacerbation: Secondary | ICD-10-CM | POA: Diagnosis not present

## 2019-04-20 DIAGNOSIS — R062 Wheezing: Secondary | ICD-10-CM | POA: Diagnosis not present

## 2019-04-20 DIAGNOSIS — J449 Chronic obstructive pulmonary disease, unspecified: Secondary | ICD-10-CM | POA: Diagnosis not present

## 2019-04-20 DIAGNOSIS — J441 Chronic obstructive pulmonary disease with (acute) exacerbation: Secondary | ICD-10-CM | POA: Diagnosis not present

## 2019-04-20 DIAGNOSIS — J9601 Acute respiratory failure with hypoxia: Secondary | ICD-10-CM | POA: Diagnosis not present

## 2019-04-22 DIAGNOSIS — J441 Chronic obstructive pulmonary disease with (acute) exacerbation: Secondary | ICD-10-CM | POA: Diagnosis not present

## 2019-04-22 DIAGNOSIS — J449 Chronic obstructive pulmonary disease, unspecified: Secondary | ICD-10-CM | POA: Diagnosis not present

## 2019-04-22 DIAGNOSIS — J9601 Acute respiratory failure with hypoxia: Secondary | ICD-10-CM | POA: Diagnosis not present

## 2019-04-22 DIAGNOSIS — R062 Wheezing: Secondary | ICD-10-CM | POA: Diagnosis not present

## 2019-05-06 DIAGNOSIS — R0602 Shortness of breath: Secondary | ICD-10-CM | POA: Diagnosis not present

## 2019-05-06 DIAGNOSIS — J441 Chronic obstructive pulmonary disease with (acute) exacerbation: Secondary | ICD-10-CM | POA: Diagnosis not present

## 2019-05-06 DIAGNOSIS — J449 Chronic obstructive pulmonary disease, unspecified: Secondary | ICD-10-CM | POA: Diagnosis not present

## 2019-05-06 DIAGNOSIS — J9601 Acute respiratory failure with hypoxia: Secondary | ICD-10-CM | POA: Diagnosis not present

## 2019-05-18 DEATH — deceased

## 2019-12-26 IMAGING — DX DG ABDOMEN 1V
1 series · 2 of 2 positions shown · non-contrast
Comparison: Plain films of the abdomen dated 11/20/2007 and
07/01/2017

CLINICAL DATA: Recent onset nausea, fatigue, shortness of breath.
Recent hospitalization for COPD exacerbation and influenza.

EXAM:
ABDOMEN - 1 VIEW

[Series 1: abdomen kub · 0.14mm/px · 2 of 2 slices shown]
[im 1/2]
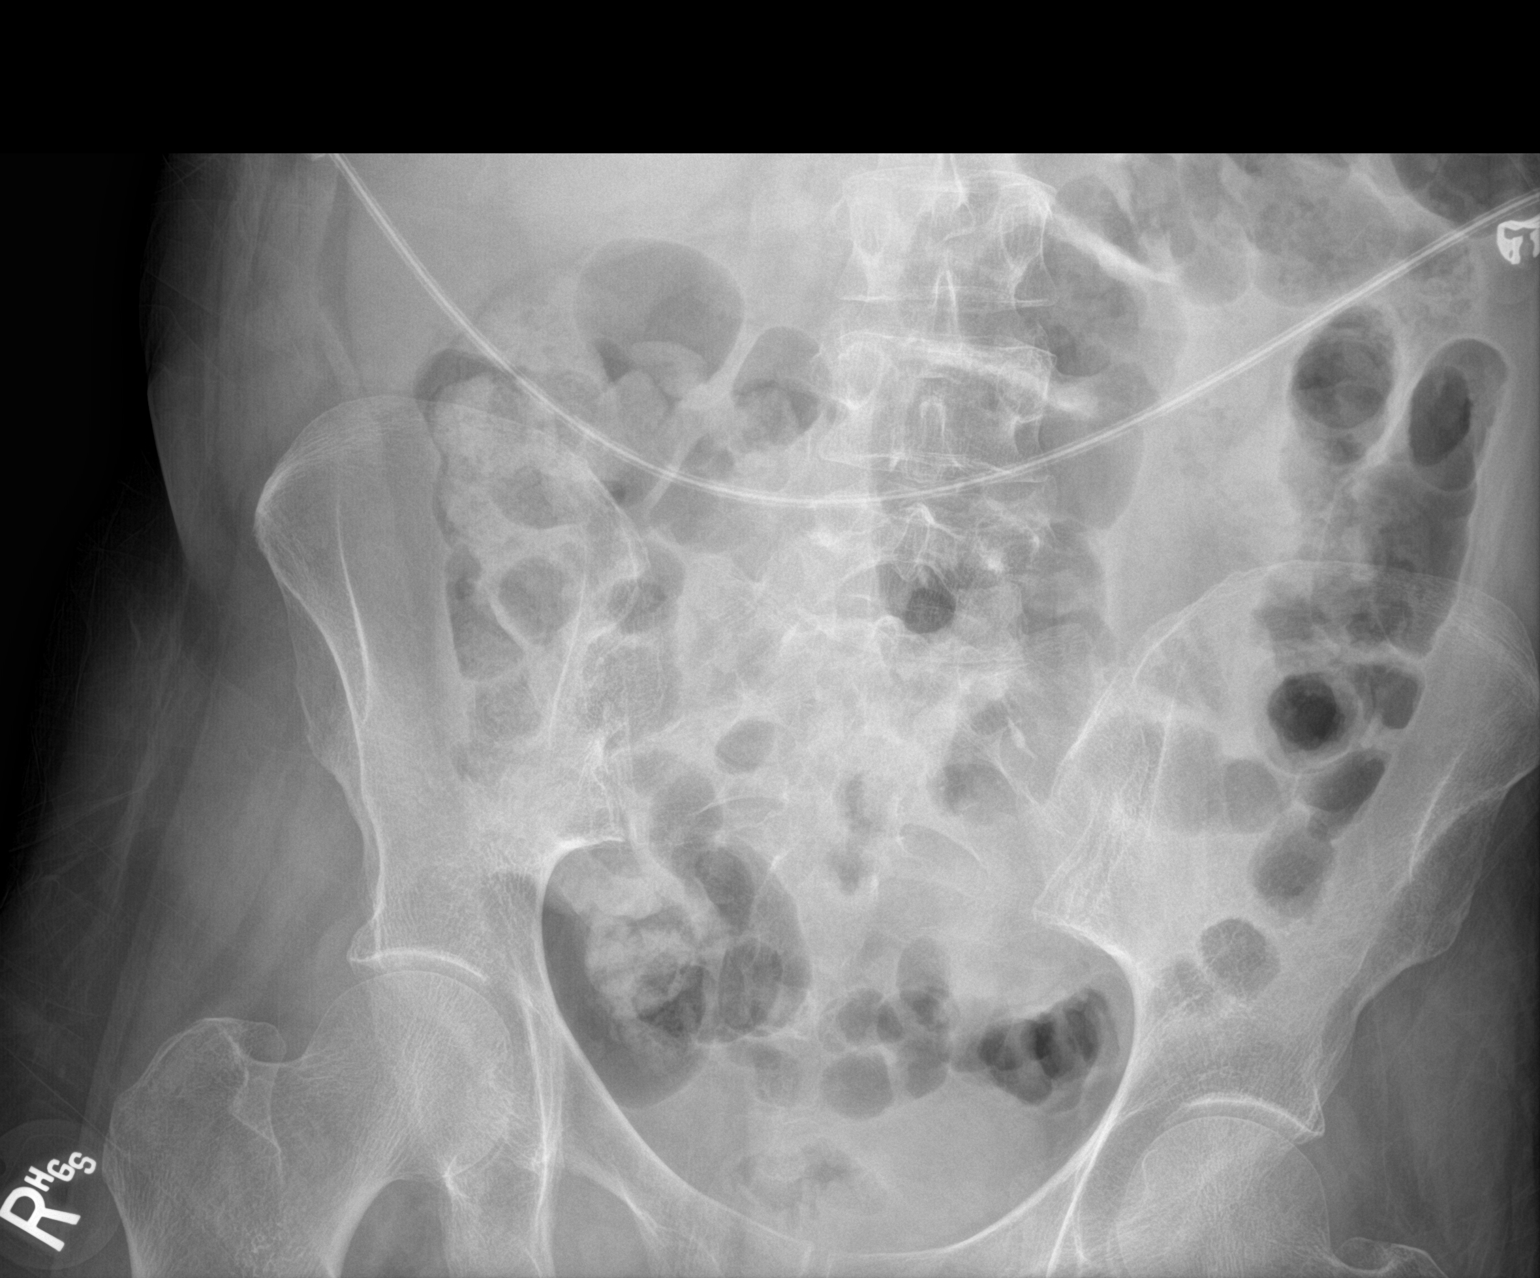
[im 2/2]
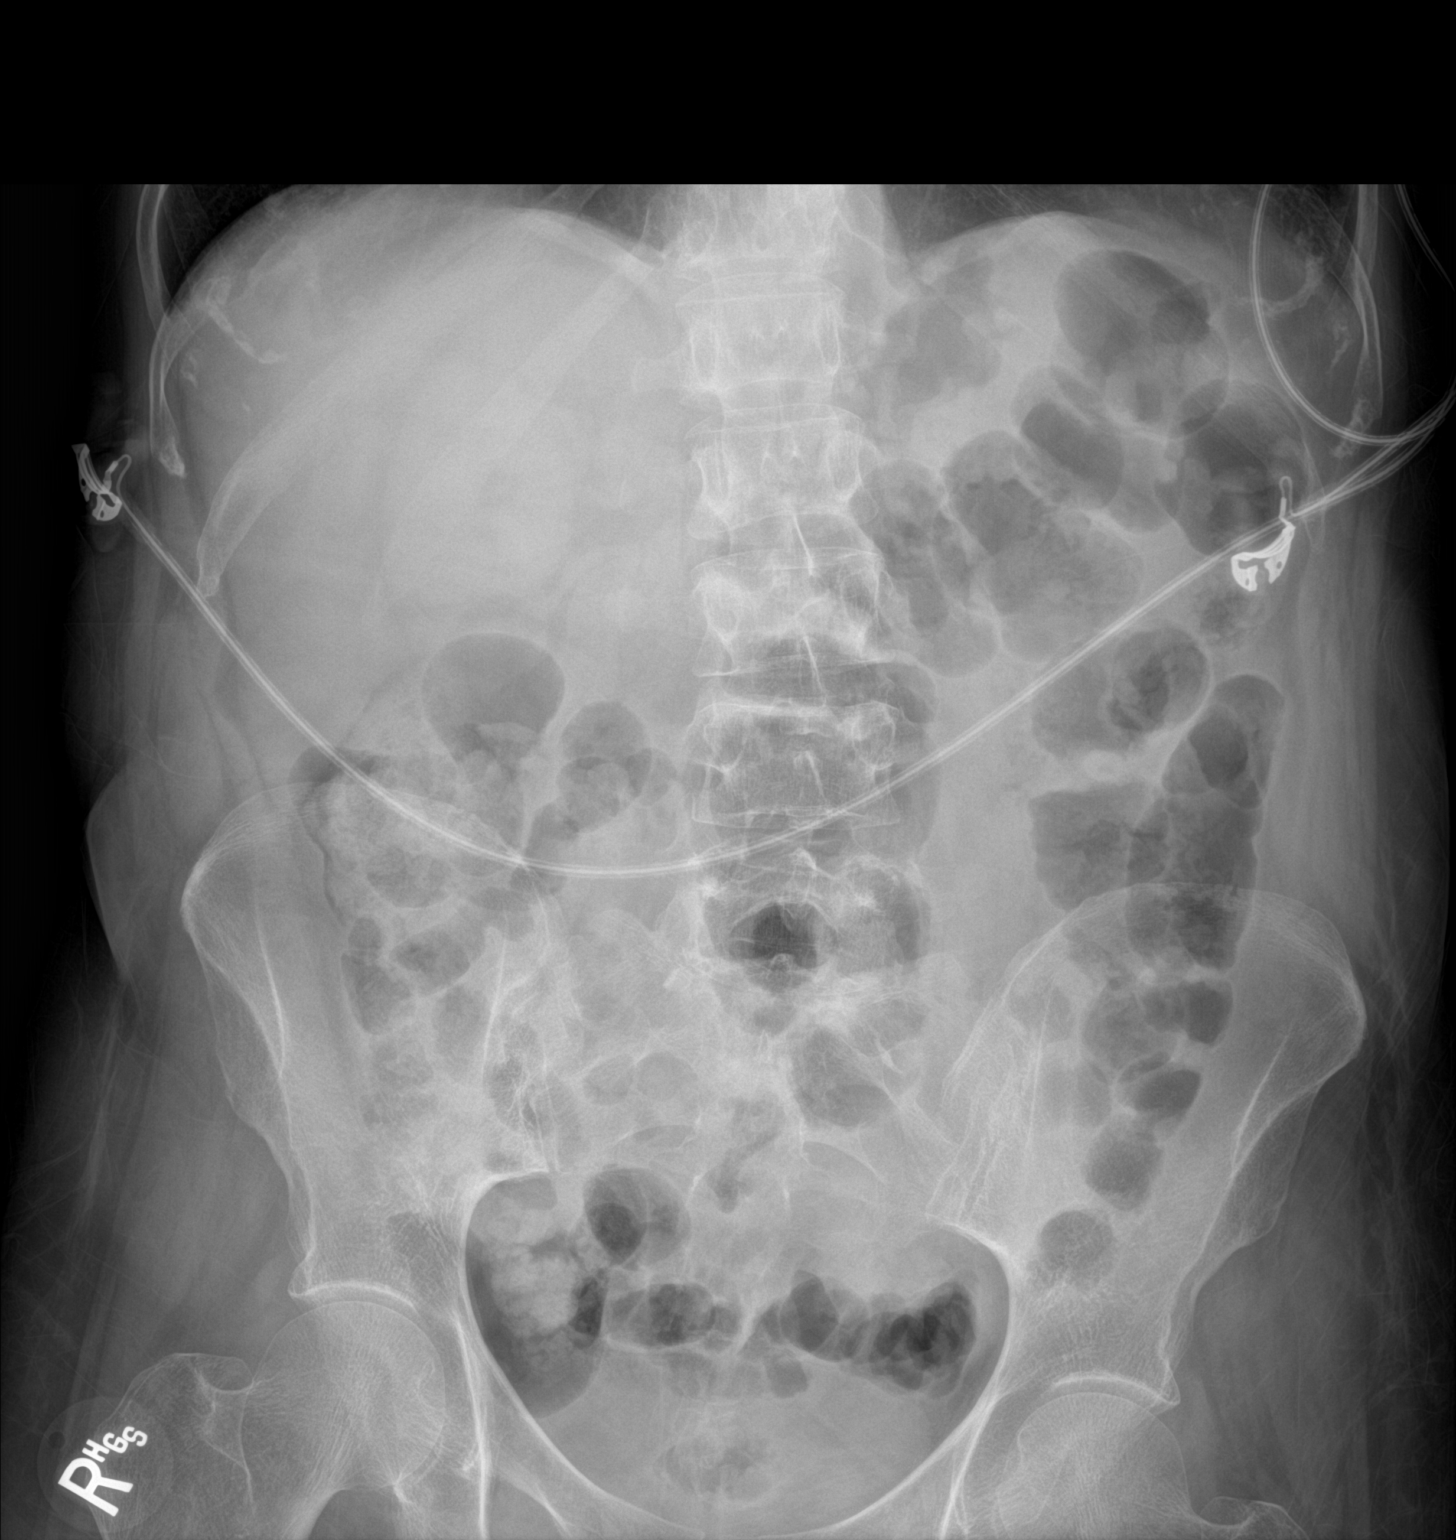

[2 of 2 positions shown; findings below may reference images not displayed]

FINDINGS: Bowel gas pattern is nonobstructive. No evidence of soft tissue mass
or abnormal fluid collection. No evidence of free intraperitoneal
air. No pathologic appearing calcifications.

Lung bases are clear.  No acute or suspicious osseous finding.
IMPRESSION: No acute findings.  Nonobstructive bowel gas pattern.

## 2020-10-11 IMAGING — DX DG CHEST 1V PORT
1 series · 1 of 1 positions shown · non-contrast
Comparison: 08/31/2018

CLINICAL DATA: Stage IV COPD with dyspnea

EXAM:
PORTABLE CHEST 1 VIEW

[chest ap]
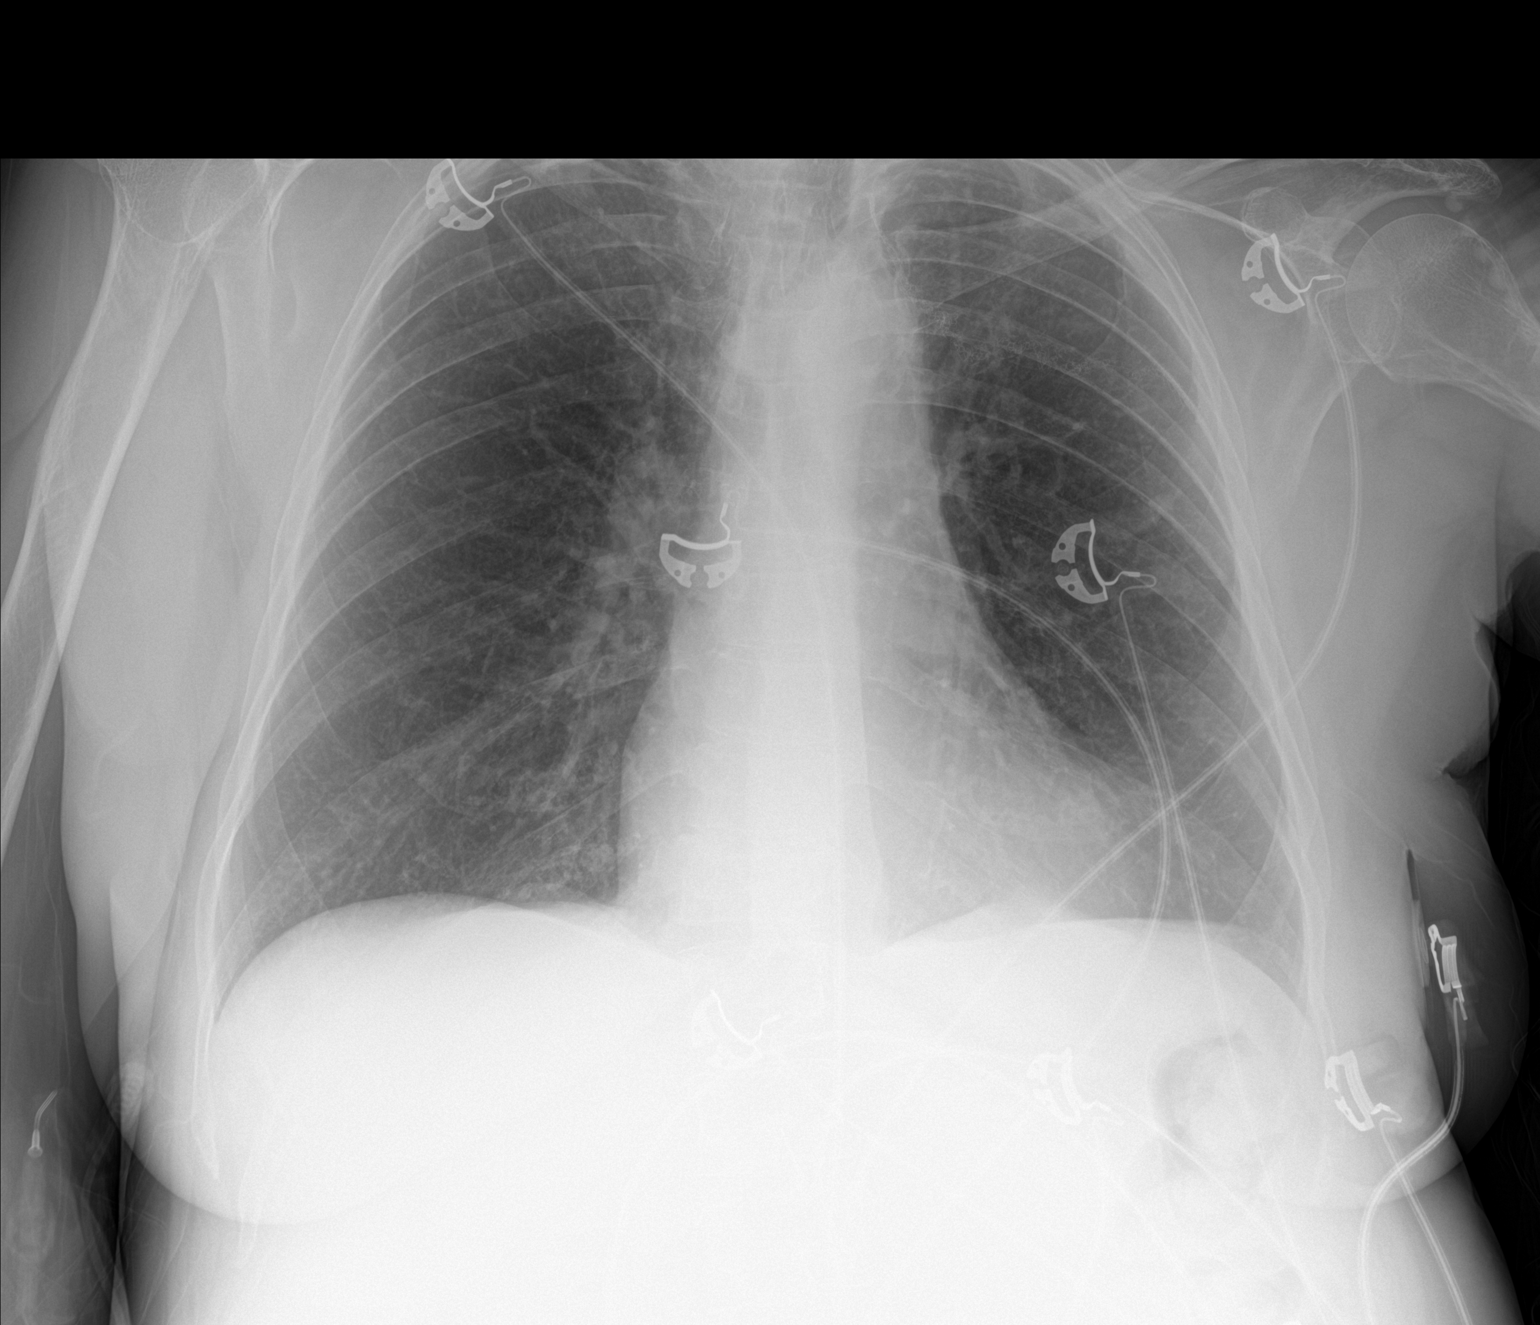

[1 of 1 positions shown; findings below may reference images not displayed]

FINDINGS: Mild pulmonary hyperinflation. Normal heart size and mediastinal
contours with minimal aortic atherosclerosis at the arch. Vascular
calcifications project over the right lung apex as well. No thoracic
aortic aneurysm. No effusion nor pneumothorax. No acute osseous
abnormality.
IMPRESSION: Mild pulmonary hyperinflation without active pulmonary disease.

## 2020-10-19 IMAGING — XA DG C-ARM 61-120 MIN
1 series · 1 of 1 positions shown · non-contrast
Comparison: Thoracic spine MRI 09/14/2018

CLINICAL DATA: T6 kyphoplasty.

EXAM:
THORACIC SPINE 2 VIEWS; DG C-ARM 61-120 MIN

[Series 1: ortho standard · 1 of 1 slices shown]
[im 1/1]
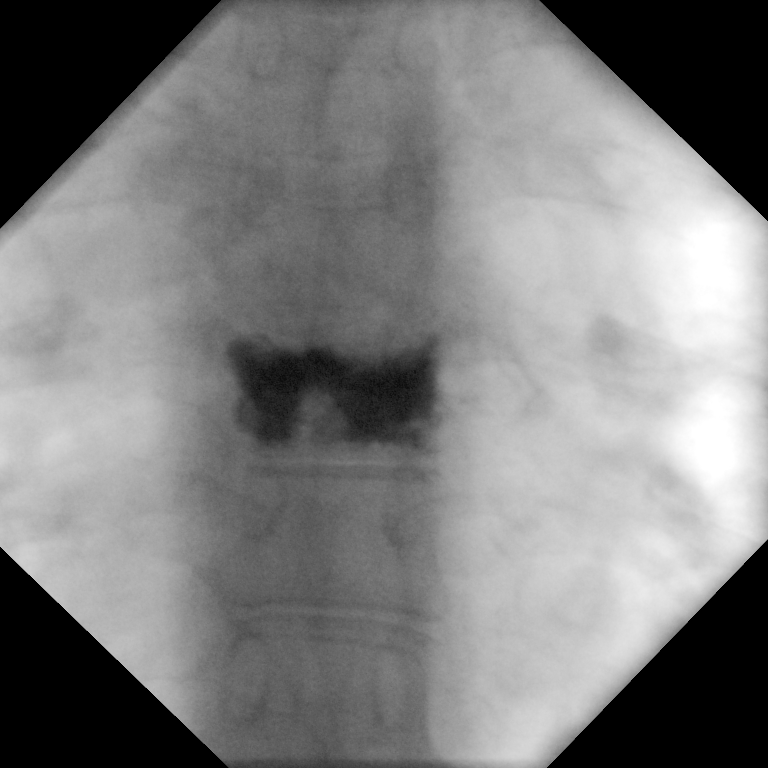

[1 of 1 positions shown; findings below may reference images not displayed]

FLUOROSCOPY TIME:  Fluoroscopy Time:  2 minutes 13 seconds

Radiation Exposure Index (if provided by the fluoroscopic device):
20.6 mGy
FINDINGS: Two intraoperative fluoroscopic images of the thoracic spine are
provided. Confirmation of spinal numbering is limited by the small
field-of-view, however there has been interval augmentation of a
compression fracture presumably corresponding to the T6 fracture
shown on the recent MRI. There is bilateral spread of
methylmethacrylate within the vertebral body without evidence of
significant extravasation.
IMPRESSION: Intraoperative images during T6 kyphoplasty.

## 2020-10-24 IMAGING — DX DG CHEST 1V
1 series · 1 of 1 positions shown · non-contrast
Comparison: 09/13/2018 CXR, 04/25/2018

CLINICAL DATA: COPD with worsening dyspnea. Audible wheeze.

EXAM:
CHEST  1 VIEW

[chest ap]
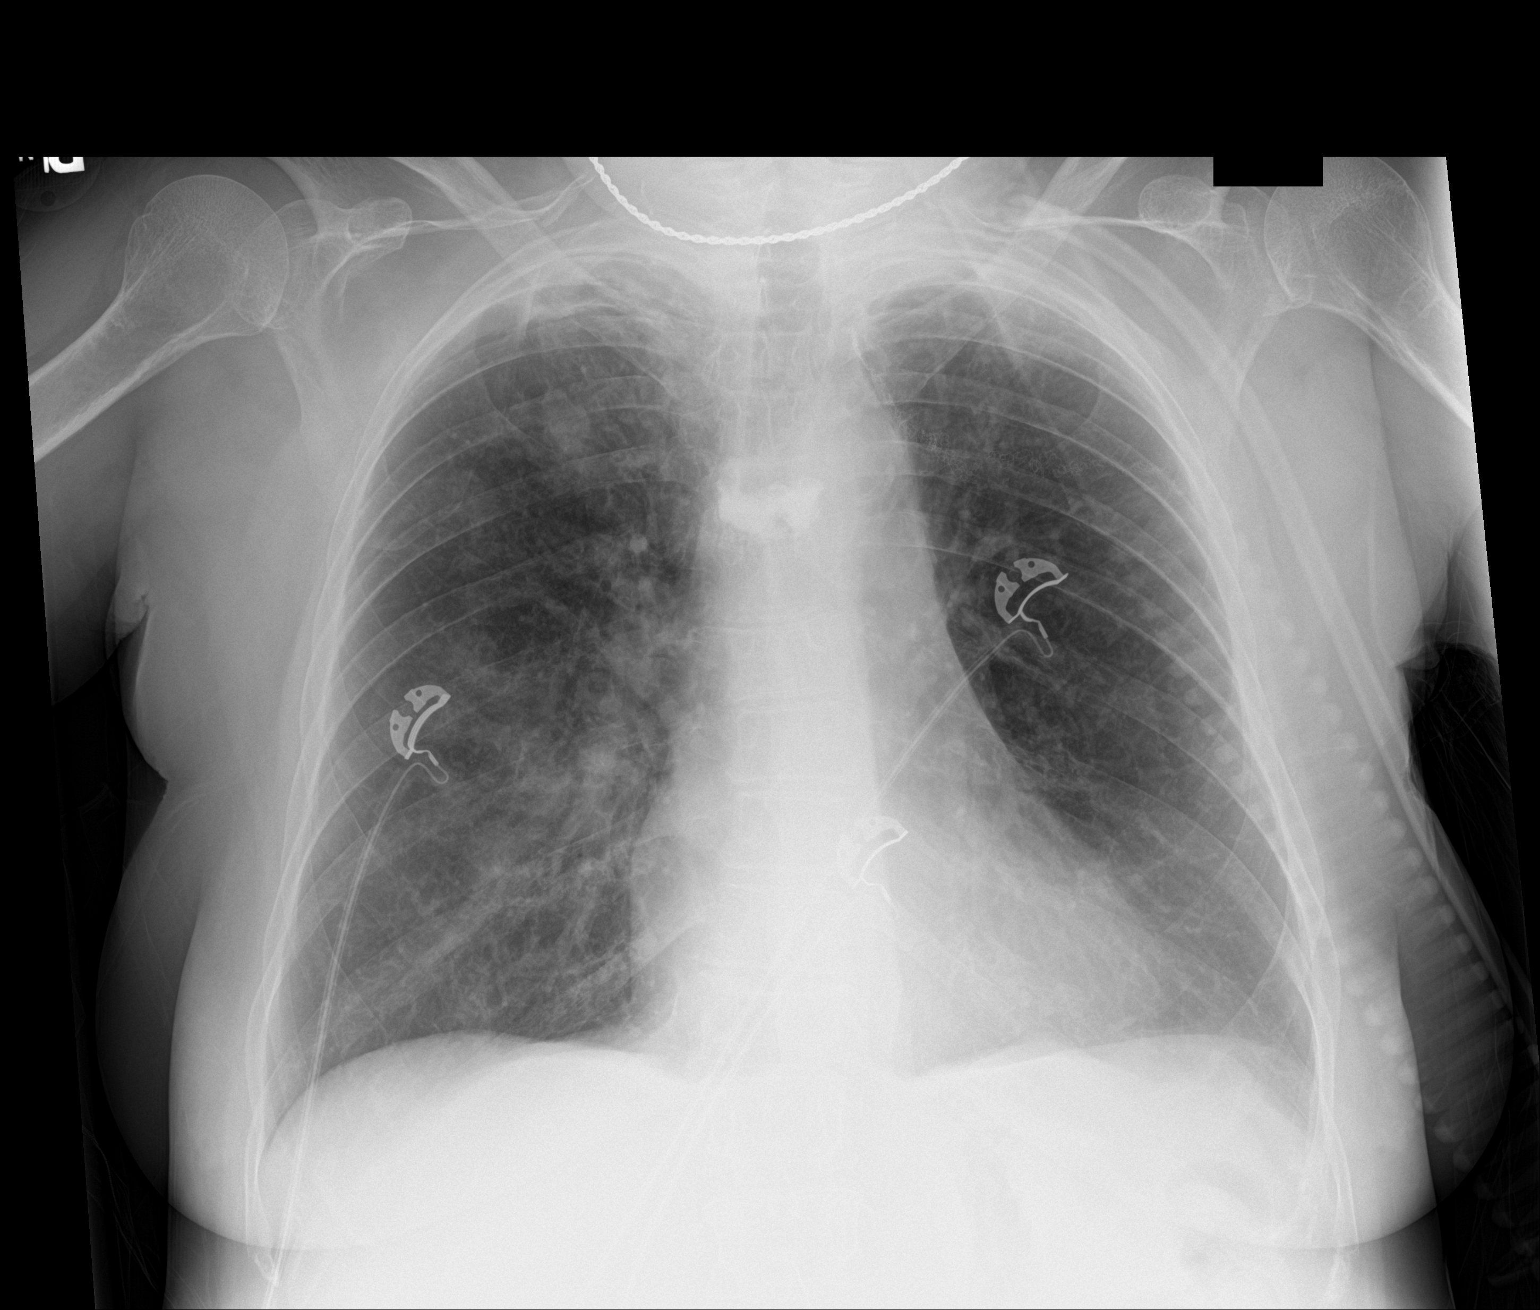

[1 of 1 positions shown; findings below may reference images not displayed]

FINDINGS: Top-normal heart size. Mild aortic atherosclerosis at the arch
without aneurysmal dilatation. Evidence of prior T6 kyphoplasty with
cement in place. Central vascular congestion superimposed on
emphysematous lungs are identified. New ovoid opacity in the right
upper lobe measuring 19 x 10 mm is noted not apparent on prior chest
CT or CXR. Repeat chest CT may help for better evaluation and to
exclude a pulmonary nodule. Pleural thickening or plaque, or
fluid-filled bulla are some other possibilities. Biapical
pleuroparenchymal scarring and thickening is redemonstrated.
IMPRESSION: 1. New ovoid opacity in the right upper lobe measuring 19 x 10 mm.
Repeat chest CT may help for better evaluation and to exclude a
pulmonary nodule.
2. Central vascular congestion superimposed on emphysema.
